# Patient Record
Sex: Female | Born: 1960 | Race: Black or African American | Hispanic: No | Marital: Single | Smoking: Current every day smoker
Health system: Southern US, Community
[De-identification: ages and names within clinical notes are randomized; demographics above are authoritative.]

## PROBLEM LIST (undated history)

## (undated) DIAGNOSIS — N393 Stress incontinence (female) (male): Secondary | ICD-10-CM

## (undated) DIAGNOSIS — E119 Type 2 diabetes mellitus without complications: Secondary | ICD-10-CM

## (undated) DIAGNOSIS — I509 Heart failure, unspecified: Secondary | ICD-10-CM

## (undated) DIAGNOSIS — I10 Essential (primary) hypertension: Secondary | ICD-10-CM

## (undated) DIAGNOSIS — R053 Chronic cough: Secondary | ICD-10-CM

## (undated) DIAGNOSIS — N189 Chronic kidney disease, unspecified: Secondary | ICD-10-CM

---

## 2000-05-21 ENCOUNTER — Other Ambulatory Visit: Admission: RE | Admit: 2000-05-21 | Discharge: 2000-05-21 | Payer: Self-pay | Admitting: *Deleted

## 2003-06-25 ENCOUNTER — Emergency Department (HOSPITAL_COMMUNITY): Admission: AD | Admit: 2003-06-25 | Discharge: 2003-06-25 | Payer: Self-pay | Admitting: Family Medicine

## 2004-05-25 ENCOUNTER — Emergency Department (HOSPITAL_COMMUNITY): Admission: EM | Admit: 2004-05-25 | Discharge: 2004-05-25 | Payer: Self-pay | Admitting: Family Medicine

## 2005-08-28 ENCOUNTER — Emergency Department (HOSPITAL_COMMUNITY): Admission: EM | Admit: 2005-08-28 | Discharge: 2005-08-28 | Payer: Self-pay | Admitting: Family Medicine

## 2006-05-02 ENCOUNTER — Emergency Department (HOSPITAL_COMMUNITY): Admission: EM | Admit: 2006-05-02 | Discharge: 2006-05-02 | Payer: Self-pay | Admitting: Emergency Medicine

## 2006-09-14 ENCOUNTER — Emergency Department (HOSPITAL_COMMUNITY): Admission: EM | Admit: 2006-09-14 | Discharge: 2006-09-14 | Payer: Self-pay | Admitting: Emergency Medicine

## 2008-01-26 ENCOUNTER — Ambulatory Visit: Payer: Self-pay | Admitting: Family Medicine

## 2008-01-26 ENCOUNTER — Ambulatory Visit: Payer: Self-pay | Admitting: *Deleted

## 2008-02-03 ENCOUNTER — Ambulatory Visit (HOSPITAL_COMMUNITY): Admission: RE | Admit: 2008-02-03 | Discharge: 2008-02-03 | Payer: Self-pay | Admitting: Family Medicine

## 2008-02-28 ENCOUNTER — Emergency Department (HOSPITAL_COMMUNITY): Admission: EM | Admit: 2008-02-28 | Discharge: 2008-02-28 | Payer: Self-pay | Admitting: Family Medicine

## 2009-06-04 ENCOUNTER — Ambulatory Visit: Payer: Self-pay | Admitting: Family Medicine

## 2010-02-04 ENCOUNTER — Emergency Department (HOSPITAL_COMMUNITY): Admission: EM | Admit: 2010-02-04 | Discharge: 2010-02-04 | Payer: Self-pay | Admitting: Family Medicine

## 2010-05-04 ENCOUNTER — Encounter: Payer: Self-pay | Admitting: Family Medicine

## 2010-05-21 ENCOUNTER — Inpatient Hospital Stay (INDEPENDENT_AMBULATORY_CARE_PROVIDER_SITE_OTHER)
Admission: RE | Admit: 2010-05-21 | Discharge: 2010-05-21 | Disposition: A | Discharge status: Home / Self Care | Payer: Self-pay | Source: Ambulatory Visit | Attending: Family Medicine | Admitting: Family Medicine

## 2010-05-21 DIAGNOSIS — J40 Bronchitis, not specified as acute or chronic: Secondary | ICD-10-CM

## 2010-08-31 ENCOUNTER — Inpatient Hospital Stay (INDEPENDENT_AMBULATORY_CARE_PROVIDER_SITE_OTHER)
Admission: RE | Admit: 2010-08-31 | Discharge: 2010-08-31 | Disposition: A | Discharge status: Home / Self Care | Payer: Self-pay | Source: Ambulatory Visit | Attending: Family Medicine | Admitting: Family Medicine

## 2010-08-31 DIAGNOSIS — L03119 Cellulitis of unspecified part of limb: Secondary | ICD-10-CM

## 2010-09-26 ENCOUNTER — Inpatient Hospital Stay (INDEPENDENT_AMBULATORY_CARE_PROVIDER_SITE_OTHER)
Admission: RE | Admit: 2010-09-26 | Discharge: 2010-09-26 | Disposition: A | Discharge status: Home / Self Care | Payer: Self-pay | Source: Ambulatory Visit | Attending: Family Medicine | Admitting: Family Medicine

## 2010-09-26 DIAGNOSIS — L02619 Cutaneous abscess of unspecified foot: Secondary | ICD-10-CM

## 2010-12-23 ENCOUNTER — Other Ambulatory Visit: Payer: Self-pay | Admitting: Family Medicine

## 2010-12-23 ENCOUNTER — Other Ambulatory Visit (HOSPITAL_COMMUNITY): Payer: Self-pay | Admitting: Family Medicine

## 2010-12-23 DIAGNOSIS — Z1231 Encounter for screening mammogram for malignant neoplasm of breast: Secondary | ICD-10-CM

## 2011-01-02 ENCOUNTER — Ambulatory Visit (HOSPITAL_COMMUNITY)
Admission: RE | Admit: 2011-01-02 | Discharge: 2011-01-02 | Disposition: A | Discharge status: Home / Self Care | Payer: Self-pay | Source: Ambulatory Visit | Attending: Family Medicine | Admitting: Family Medicine

## 2011-01-02 DIAGNOSIS — Z1231 Encounter for screening mammogram for malignant neoplasm of breast: Secondary | ICD-10-CM | POA: Insufficient documentation

## 2011-01-07 ENCOUNTER — Other Ambulatory Visit: Payer: Self-pay | Admitting: Family Medicine

## 2011-01-07 DIAGNOSIS — R928 Other abnormal and inconclusive findings on diagnostic imaging of breast: Secondary | ICD-10-CM

## 2011-01-13 LAB — POCT URINALYSIS DIP (DEVICE)
Bilirubin Urine: NEGATIVE
Glucose, UA: NEGATIVE mg/dL
Hgb urine dipstick: NEGATIVE
Ketones, ur: NEGATIVE mg/dL
Nitrite: NEGATIVE
Protein, ur: NEGATIVE mg/dL
Specific Gravity, Urine: 1.02 (ref 1.005–1.030)
Urobilinogen, UA: 0.2 mg/dL (ref 0.0–1.0)
pH: 6.5 (ref 5.0–8.0)

## 2011-01-13 LAB — POCT PREGNANCY, URINE: Preg Test, Ur: NEGATIVE

## 2011-01-19 ENCOUNTER — Ambulatory Visit
Admission: RE | Admit: 2011-01-19 | Discharge: 2011-01-19 | Disposition: A | Discharge status: Home / Self Care | Payer: No Typology Code available for payment source | Source: Ambulatory Visit | Attending: Family Medicine | Admitting: Family Medicine

## 2011-01-19 DIAGNOSIS — R928 Other abnormal and inconclusive findings on diagnostic imaging of breast: Secondary | ICD-10-CM

## 2012-05-15 ENCOUNTER — Emergency Department (HOSPITAL_COMMUNITY)
Admission: EM | Admit: 2012-05-15 | Discharge: 2012-05-15 | Disposition: A | Discharge status: Home / Self Care | Payer: Self-pay | Attending: Emergency Medicine | Admitting: Emergency Medicine

## 2012-05-15 ENCOUNTER — Encounter (HOSPITAL_COMMUNITY): Payer: Self-pay | Admitting: *Deleted

## 2012-05-15 ENCOUNTER — Emergency Department (HOSPITAL_COMMUNITY): Payer: Self-pay

## 2012-05-15 ENCOUNTER — Encounter (HOSPITAL_COMMUNITY): Payer: Self-pay | Admitting: Emergency Medicine

## 2012-05-15 ENCOUNTER — Emergency Department (INDEPENDENT_AMBULATORY_CARE_PROVIDER_SITE_OTHER)
Admission: EM | Admit: 2012-05-15 | Discharge: 2012-05-15 | Disposition: A | Discharge status: Nursing Home (Includes ICF) | Payer: Self-pay | Source: Home / Self Care | Attending: Emergency Medicine | Admitting: Emergency Medicine

## 2012-05-15 DIAGNOSIS — R05 Cough: Secondary | ICD-10-CM | POA: Insufficient documentation

## 2012-05-15 DIAGNOSIS — R059 Cough, unspecified: Secondary | ICD-10-CM | POA: Insufficient documentation

## 2012-05-15 DIAGNOSIS — R079 Chest pain, unspecified: Secondary | ICD-10-CM | POA: Insufficient documentation

## 2012-05-15 DIAGNOSIS — R509 Fever, unspecified: Secondary | ICD-10-CM | POA: Insufficient documentation

## 2012-05-15 DIAGNOSIS — R5381 Other malaise: Secondary | ICD-10-CM | POA: Insufficient documentation

## 2012-05-15 DIAGNOSIS — R093 Abnormal sputum: Secondary | ICD-10-CM | POA: Insufficient documentation

## 2012-05-15 DIAGNOSIS — J189 Pneumonia, unspecified organism: Secondary | ICD-10-CM | POA: Insufficient documentation

## 2012-05-15 DIAGNOSIS — I10 Essential (primary) hypertension: Secondary | ICD-10-CM | POA: Insufficient documentation

## 2012-05-15 DIAGNOSIS — J029 Acute pharyngitis, unspecified: Secondary | ICD-10-CM | POA: Insufficient documentation

## 2012-05-15 DIAGNOSIS — J3489 Other specified disorders of nose and nasal sinuses: Secondary | ICD-10-CM | POA: Insufficient documentation

## 2012-05-15 DIAGNOSIS — IMO0001 Reserved for inherently not codable concepts without codable children: Secondary | ICD-10-CM | POA: Insufficient documentation

## 2012-05-15 DIAGNOSIS — R490 Dysphonia: Secondary | ICD-10-CM | POA: Insufficient documentation

## 2012-05-15 DIAGNOSIS — Z79899 Other long term (current) drug therapy: Secondary | ICD-10-CM | POA: Insufficient documentation

## 2012-05-15 DIAGNOSIS — F17200 Nicotine dependence, unspecified, uncomplicated: Secondary | ICD-10-CM | POA: Insufficient documentation

## 2012-05-15 DIAGNOSIS — R0602 Shortness of breath: Secondary | ICD-10-CM | POA: Insufficient documentation

## 2012-05-15 LAB — CBC WITH DIFFERENTIAL/PLATELET
Basophils Absolute: 0 10*3/uL (ref 0.0–0.1)
Basophils Relative: 0 % (ref 0–1)
Eosinophils Relative: 1 % (ref 0–5)
HCT: 43.4 % (ref 36.0–46.0)
Hemoglobin: 15.3 g/dL — ABNORMAL HIGH (ref 12.0–15.0)
Lymphocytes Relative: 28 % (ref 12–46)
Lymphs Abs: 2.1 10*3/uL (ref 0.7–4.0)
MCHC: 35.3 g/dL (ref 30.0–36.0)
MCV: 86.5 fL (ref 78.0–100.0)
Monocytes Absolute: 0.9 10*3/uL (ref 0.1–1.0)
Platelets: 255 10*3/uL (ref 150–400)
RDW: 13.2 % (ref 11.5–15.5)
WBC: 7.4 10*3/uL (ref 4.0–10.5)

## 2012-05-15 LAB — POCT I-STAT TROPONIN I: Troponin i, poc: 0 ng/mL (ref 0.00–0.08)

## 2012-05-15 LAB — BASIC METABOLIC PANEL
CO2: 21 mEq/L (ref 19–32)
Calcium: 9.5 mg/dL (ref 8.4–10.5)
Chloride: 100 mEq/L (ref 96–112)
Creatinine, Ser: 0.62 mg/dL (ref 0.50–1.10)
GFR calc Af Amer: >90 mL/min (ref >90)
Glucose, Bld: 125 mg/dL — ABNORMAL HIGH (ref 70–99)
Potassium: 3.9 mEq/L (ref 3.5–5.1)
Sodium: 132 mEq/L — ABNORMAL LOW (ref 135–145)

## 2012-05-15 MED ORDER — HYDROCHLOROTHIAZIDE 25 MG PO TABS: 12.5000 mg | ORAL_TABLET | Freq: Every day | ORAL | Status: DC

## 2012-05-15 MED ORDER — HYDROCHLOROTHIAZIDE 25 MG PO TABS
12.5000 mg | ORAL_TABLET | Freq: Once | ORAL | Status: AC
  Administered 2012-05-15: 12.5 mg via ORAL
  Filled 2012-05-15: qty 1

## 2012-05-15 MED ORDER — CEPHALEXIN 500 MG PO CAPS: 1000.0000 mg | ORAL_CAPSULE | Freq: Two times a day (BID) | ORAL | Status: DC

## 2012-05-15 MED ORDER — AZITHROMYCIN 250 MG PO TABS: ORAL_TABLET | ORAL | Status: DC

## 2012-05-15 NOTE — ED Notes (Signed)
Pt resting on exam table.  Denies any HA.

## 2012-05-15 NOTE — ED Notes (Signed)
C/O cough with inability to cough up sputum x 3 days with some SOB, chest pain when coughing, hoarse voice.  Has been taking Mucinex and nighttime cough med, and Tyl.  C/O feeling very weak.  Denies hx HTN. Left breath sounds diminished.  Denies HA, but c/o facial aching.

## 2012-05-15 NOTE — ED Notes (Signed)
Pt transported to xray 

## 2012-05-15 NOTE — ED Notes (Addendum)
Onset 3 days ago productive cough yellow sputum with chest pain upon coughing and body achy. Seen urgent care sent to ED for further evaluation of symptoms and high blood pressure.

## 2012-05-15 NOTE — ED Notes (Signed)
Provider exam in progress.

## 2012-05-15 NOTE — ED Notes (Signed)
Pt to be transferred to ED via shuttle.  Report called to [facility], ED Nurse First.

## 2012-05-15 NOTE — ED Provider Notes (Signed)
Medical screening examination/treatment/procedure(s) were performed by non-physician practitioner and as supervising physician I was immediately available for consultation/collaboration.  Raynald Blend, MD 05/15/12 [phone]

## 2012-05-15 NOTE — ED Provider Notes (Signed)
History     CSN: [id number]  Arrival date & time 05/15/12  1333   First MD Initiated Contact with Patient 05/15/12 1413      Chief Complaint  Patient presents with  . Cough  . Fever  . Chest Pain    HPI: Patient is a 52 y.o. female presenting with cough, fever, and chest pain. The history is provided by the patient.  Cough This is a new problem. The current episode started more than 2 days ago. The problem occurs every few minutes. The problem has been gradually worsening. The cough is productive of sputum. The maximum temperature recorded prior to her arrival was 101 to 101.9 F. The fever has been present for less than 1 day. Associated symptoms include chest pain, chills, headaches and sore throat. Pertinent negatives include no ear congestion, no ear pain, no rhinorrhea, no myalgias, no shortness of breath and no wheezing. She has tried decongestants and cough syrup for the symptoms. The treatment provided no relief.  Fever Primary symptoms of the febrile illness include fever, headaches and cough. Primary symptoms do not include wheezing, shortness of breath, abdominal pain, nausea, vomiting, diarrhea, dysuria or myalgias.  Chest Pain Primary symptoms include a fever and cough. Pertinent negatives for primary symptoms include no shortness of breath, no wheezing, no abdominal pain, no nausea and no vomiting.   Pt reports onset of sever persistent cough 3 days ago that has worsened. The cough is productive of thick yellow sputum. Symptoms started with chills and fever though pt did not take her temp so amount unknown. State she has coughed so much that her chest hurts, her throat is scratchy and raw and it burns when she breathes in.  When ask about her BP pt admits she has been told in the past that she had high BP but has never been placed on medication for same. She denies h/a but c/o facial aching. Pt denies N/V/D or abd pain.   History reviewed. No pertinent past medical  history.  History reviewed. No pertinent past surgical history.  No family history on file.  History  Substance Use Topics  . Smoking status: Current Some Day Smoker    Types: Cigarettes  . Smokeless tobacco: Not on file  . Alcohol Use: No    OB History    Grav Para Term Preterm Abortions TAB SAB Ect Mult Living                  Review of Systems  Constitutional: Positive for fever and chills.  HENT: Positive for sore throat. Negative for ear pain and rhinorrhea.   Eyes: Negative.   Respiratory: Positive for cough. Negative for shortness of breath and wheezing.   Cardiovascular: Positive for chest pain.  Gastrointestinal: Negative for nausea, vomiting, abdominal pain and diarrhea.  Genitourinary: Negative.  Negative for dysuria and frequency.  Musculoskeletal: Negative.  Negative for myalgias.  Skin: Negative.   Neurological: Positive for headaches.  Hematological: Negative.   Psychiatric/Behavioral: Negative.     Allergies  Review of patient's allergies indicates no known allergies.  Home Medications  No current outpatient prescriptions on file.  BP 216/109  Pulse 78  Temp 98.2 F (36.8 C) (Oral)  Resp 20  SpO2 99%  LMP 04/19/2012  Physical Exam  Constitutional: She is oriented to person, place, and time. She appears well-developed and well-nourished. She appears ill.  HENT:  Head: Normocephalic and atraumatic.  Right Ear: Tympanic membrane, external ear and ear canal normal.  Left Ear: Tympanic membrane, external ear and ear canal normal.  Nose: Nose normal.  Mouth/Throat: Oropharynx is clear and moist.  Eyes: Conjunctivae normal are normal.  Neck: Neck supple.  Cardiovascular: Normal rate and regular rhythm.   Pulmonary/Chest:       BBS diminished w/ mild tachypnea  Musculoskeletal: Normal range of motion.  Neurological: She is alert and oriented to person, place, and time.  Skin: Skin is warm and dry.  Psychiatric: She has a normal mood and affect.     ED Course  Procedures   Labs Reviewed - No data to display No results found.   No diagnosis found.    MDM  Pt w/ 3 day h/o severe, persistent, productive cough.  Pt appears ill. SBP > 200, DBP >110. EKG w/o acute findings. Given hx, current PE and hypertension feel pt needs more in depth evaluation than can be provided here. Pt likely will will need full evaluation for cough and HTN. Discussed w/ pt who is agreeable. Discussed pt w/ Dr Ladon Applebaum who has also reviewed EKG and is agreement w/ plan.         Roma Kayser Skylar Priest, NP 05/15/12 [phone]

## 2012-05-15 NOTE — ED Notes (Signed)
Merdis Delay, NP notified of BP and sxs.

## 2012-05-15 NOTE — ED Provider Notes (Signed)
History     CSN: [id number]  Arrival date & time 05/15/12  1537   First MD Initiated Contact with Patient 05/15/12 1554      Chief Complaint  Patient presents with  . Cough  . Chest Pain  . Hypertension    (Consider location/radiation/quality/duration/timing/severity/associated sxs/prior treatment) HPI Comments: 52 y /o F p/w cough, SOB, chest pain. Onset cough aboud 3 days ago. Intermittently productive of yellow sputum. Initially with chest pain only with coughing. Now more persistent today. SOB primarily with exertion. Also with hoarse voice today. Reports some congestion, rhinorrhea, and sore throat. Tolerating po. Diffuse weakness. Subjective fevers at home. Taking tylenol, cough syrup, and mucinex.  Presented to urgent care and found to be hypertensive. No h /o hypertension. Last saw PCP for physical one year ago. Smokes. No h/o heart dz or DVT/PE in personal history or family history.  Patient is a 52 y.o. female presenting with cough, chest pain, and hypertension.  Cough This is a new problem. The current episode started more than 2 days ago. The problem occurs constantly. The problem has been gradually worsening. The cough is productive of sputum. Maximum temperature: subjective. Associated symptoms include chest pain, rhinorrhea, sore throat and shortness of breath. Pertinent negatives include no headaches. She is a smoker.  Chest Pain The chest pain began 3 - 5 days ago. Chest pain occurs intermittently. The chest pain is worsening. The pain is associated with coughing. The severity of the pain is moderate. The quality of the pain is described as aching. The pain does not radiate. Primary symptoms include a fever, fatigue, shortness of breath and cough. Pertinent negatives for primary symptoms include no abdominal pain, no nausea, no vomiting and no dizziness.  Her past medical history is significant for hypertension.    Hypertension Associated symptoms include chest pain,  congestion, coughing, fatigue, a fever and a sore throat. Pertinent negatives include no abdominal pain, headaches, nausea, neck pain, rash or vomiting.    History reviewed. No pertinent past medical history.  History reviewed. No pertinent past surgical history.  No family history on file.  History  Substance Use Topics  . Smoking status: Current Some Day Smoker    Types: Cigarettes  . Smokeless tobacco: Not on file  . Alcohol Use: No    OB History    Grav Para Term Preterm Abortions TAB SAB Ect Mult Living                  Review of Systems  Constitutional: Positive for fever and fatigue. Negative for appetite change.  HENT: Positive for congestion, sore throat and rhinorrhea. Negative for neck pain and neck stiffness.   Eyes: Negative for pain and visual disturbance.  Respiratory: Positive for cough and shortness of breath.   Cardiovascular: Positive for chest pain. Negative for leg swelling.  Gastrointestinal: Negative for nausea, vomiting, abdominal pain and diarrhea.  Genitourinary: Negative for dysuria, hematuria, flank pain and difficulty urinating.  Musculoskeletal: Negative for back pain.  Skin: Negative for color change and rash.  Neurological: Negative for dizziness and headaches.  All other systems reviewed and are negative.    Allergies  Review of patient's allergies indicates no known allergies.  Home Medications   Current Outpatient Rx  Name  Route  Sig  Dispense  Refill  . TYLENOL PO   Oral   Take 2 tablets by mouth every 4 (four) hours as needed. For pain         . MUCINEX PO  Oral   Take 2 tablets by mouth every 12 (twelve) hours as needed. For congestion         . OVER THE COUNTER MEDICATION   Oral   Take 1 Container by mouth every 4 (four) hours as needed. Night time cough medicine for cough         . AZITHROMYCIN 250 MG PO TABS      2 po day one, then 1 daily x 4 days   5 tablet   0   . CEPHALEXIN 500 MG PO CAPS   Oral    Take 2 capsules (1,000 mg total) by mouth 2 (two) times daily.   28 capsule   0   . HYDROCHLOROTHIAZIDE 25 MG PO TABS   Oral   Take 0.5 tablets (12.5 mg total) by mouth daily.   14 tablet   0     BP 193/108  Pulse 82  Temp 98.3 F (36.8 C) (Oral)  Resp 18  SpO2 99%  LMP 04/19/2012  Physical Exam  Nursing note and vitals reviewed. Constitutional: She is oriented to person, place, and time. She appears well-developed and well-nourished. No distress.       Sitting up in bed. NAD. Hoarse voice. Speaks in full sentences Occasional non-productive cough.  HENT:  Head: Normocephalic and atraumatic. No trismus in the jaw.  Mouth/Throat: Uvula is midline, oropharynx is clear and moist and mucous membranes are normal. No uvula swelling.  Eyes: Conjunctivae normal and EOM are normal. Pupils are equal, round, and reactive to light. Right eye exhibits no discharge. Left eye exhibits no discharge.  Neck: Normal range of motion. Neck supple. No tracheal deviation present.  Cardiovascular: Normal rate, regular rhythm, normal heart sounds and intact distal pulses.   Pulmonary/Chest: Effort normal and breath sounds normal. No stridor. No respiratory distress. She has no wheezes. She has no rales. She exhibits no tenderness.  Abdominal: Soft. She exhibits no distension. There is no tenderness. There is no guarding.  Musculoskeletal: She exhibits no edema and no tenderness.  Neurological: She is alert and oriented to person, place, and time.  Skin: Skin is warm and dry.  Psychiatric: She has a normal mood and affect. Her behavior is normal.    ED Course  Procedures (including critical care time)  Labs Reviewed  CBC WITH DIFFERENTIAL - Abnormal; Notable for the following:    Hemoglobin 15.3 (*)     Monocytes Relative 13 (*)     All other components within normal limits  BASIC METABOLIC PANEL - Abnormal; Notable for the following:    Sodium 132 (*)     Glucose, Bld 125 (*)     All other  components within normal limits  POCT I-STAT TROPONIN I   Dg Chest 2 View  05/15/2012  *RADIOLOGY REPORT*  Clinical Data: Cough and chest pain with hypertension.  CHEST - 2 VIEW  Comparison: 02/03/2008.  Findings: Suboptimal inspiration.  There is patchy airspace opacity over the left hemidiaphragm in the left lower lobe.  This projects over the lower thoracic spine on the lateral view.  There is no pleural effusion.  The cardiopericardial silhouette size is probably within normal limits, accentuated by low lung volumes. Follow-up to ensure radiographic clearing recommended.  Clearing is usually observed at 8 weeks.  IMPRESSION: Left lower lobe airspace disease most compatible with pneumonia. Low volume chest.   Original Report Authenticated By: Andreas Newport, M.D.      1. Community acquired pneumonia   2. Hypertension  Date: 05/16/2012  Rate: 75  Rhythm: normal sinus rhythm  QRS Axis: normal  Intervals: normal  ST/T Wave abnormalities: normal  Conduction Disutrbances:none  Narrative Interpretation:   Old EKG Reviewed: no EKG's prior to today    MDM   52 y/o female p/w cough and hypertension. Cough x3 days.   Likely PNA as CXR positive with cough Unlikely COPD/Asthma exacerbation as no history, no hypoxia, no wheezing Unlikely CHF exacerbation as no history. Low risk Unlikely ACS as troponin negative, EKG , low risk per [facility] Unlikely PE as atypical presentation, low risk per [facility] Doubt Aortic Dissection, Pancreatitis, Pneumothorax, Arrhythmia, Endo/Myo/Pericarditis, Esophageal pathology, or other Emergent pathology.   O2 saturations high 90's on room air   CXR concerning for pna. Prescribed keflex and azithromycin. Hypertensive without evidence of end organ damage. Given HCTZ x1 with improvement. Prescribed HCTZ.   Patient discharged home. Return precautions given. To follow up with pcp. patient in agreement with plan.  Labs and imaging reviewed by myself and  considered in medical decision making if ordered. Imaging interpreted by radiology.   Discussed case with Dr. Fonnie Jarvis who is in agreement with assessment and plan.    New Prescriptions   AZITHROMYCIN (ZITHROMAX Z-PAK) 250 MG TABLET    2 po day one, then 1 daily x 4 days   CEPHALEXIN (KEFLEX) 500 MG CAPSULE    Take 2 capsules (1,000 mg total) by mouth 2 (two) times daily.   HYDROCHLOROTHIAZIDE (HYDRODIURIL) 25 MG TABLET    Take 0.5 tablets (12.5 mg total) by mouth daily.     Stevie Kern, MD 05/16/12 [phone]

## 2012-05-15 NOTE — ED Provider Notes (Signed)
I saw and evaluated the patient, reviewed the resident's note including ECG interpretation and I agree with the findings and plan.  Few rales left base, patient appears nontoxic.  Hurman Horn, MD 05/18/12 [phone]

## 2013-07-24 ENCOUNTER — Encounter (HOSPITAL_COMMUNITY): Payer: Self-pay | Admitting: Emergency Medicine

## 2013-07-24 ENCOUNTER — Emergency Department (HOSPITAL_COMMUNITY)
Admission: EM | Admit: 2013-07-24 | Discharge: 2013-07-24 | Disposition: A | Discharge status: Home / Self Care | Payer: No Typology Code available for payment source | Source: Home / Self Care | Attending: Emergency Medicine | Admitting: Emergency Medicine

## 2013-07-24 ENCOUNTER — Ambulatory Visit (HOSPITAL_COMMUNITY): Payer: No Typology Code available for payment source | Attending: Emergency Medicine

## 2013-07-24 DIAGNOSIS — R059 Cough, unspecified: Secondary | ICD-10-CM | POA: Insufficient documentation

## 2013-07-24 DIAGNOSIS — R05 Cough: Secondary | ICD-10-CM | POA: Insufficient documentation

## 2013-07-24 DIAGNOSIS — J40 Bronchitis, not specified as acute or chronic: Secondary | ICD-10-CM

## 2013-07-24 HISTORY — DX: Essential (primary) hypertension: I10

## 2013-07-24 MED ORDER — AZITHROMYCIN 250 MG PO TABS: ORAL_TABLET | ORAL | Status: DC

## 2013-07-24 MED ORDER — AEROCHAMBER PLUS FLO-VU LARGE MISC: 1.0000 | Freq: Once | Status: DC

## 2013-07-24 MED ORDER — GUAIFENESIN-CODEINE 100-10 MG/5ML PO SOLN: 10.0000 mL | ORAL | Status: DC | PRN

## 2013-07-24 MED ORDER — ALBUTEROL SULFATE HFA 108 (90 BASE) MCG/ACT IN AERS: 2.0000 | INHALATION_SPRAY | RESPIRATORY_TRACT | Status: DC | PRN

## 2013-07-24 NOTE — Discharge Instructions (Signed)

## 2013-07-24 NOTE — ED Notes (Signed)
Pt states symptoms present x 2 wks.  Mw,cma

## 2013-07-24 NOTE — ED Notes (Signed)
C/o  Productive cough with green sputum.  Chest congestion.  Sob.  Chest soreness.  Denies fever , n/v/d  Mild relief of congestion with mucinex.

## 2013-07-24 NOTE — ED Provider Notes (Signed)
Medical screening examination/treatment/procedure(s) were performed by non-physician practitioner and as supervising physician I was immediately available for consultation/collaboration.  Leslee Homeavid Chasmine Lender, M.D.  Reuben Likesavid C Kortney Schoenfelder, MD 07/24/13 [phone]

## 2013-07-24 NOTE — ED Provider Notes (Signed)
CSN: [id number]     Arrival date & time 07/24/13  [id number] History   None    Chief Complaint  Patient presents with  . URI   (Consider location/radiation/quality/duration/timing/severity/associated sxs/prior Treatment) HPI Comments: [age]f, smoker, presents complaining of cough, chest congestion, chest heaviness, chest pain with coughing, shortness of breath after an episode of coughing. Her symptoms have been going on for 2 weeks, getting gradually worse. She has a history of pneumonia that felt similar to this. She has been taking over-the-counter medications without relief. She denies fever, chills, NVD, abdominal pain, sore throat, sinus pressure. No recent travel or sick contacts. No recent hospitalizations. She claims she smokes about 2-3 cigarettes daily if any at all for the past 30 years (cigarette smoke smell in the room it is overwhelming)  Patient is a 53 y.o. female presenting with URI.  URI Presenting symptoms: cough   Presenting symptoms: no fever   Associated symptoms: no wheezing     Past Medical History  Diagnosis Date  . Hypertension    History reviewed. No pertinent past surgical history. History reviewed. No pertinent family history. History  Substance Use Topics  . Smoking status: Current Some Day Smoker    Types: Cigarettes  . Smokeless tobacco: Not on file  . Alcohol Use: No   OB History   Grav Para Term Preterm Abortions TAB SAB Ect Mult Living                 Review of Systems  Constitutional: Negative for fever and chills.  Respiratory: Positive for cough, chest tightness and shortness of breath. Negative for wheezing.   All other systems reviewed and are negative.   Allergies  Review of patient's allergies indicates no known allergies.  Home Medications   Current Outpatient Rx  Name  Route  Sig  Dispense  Refill  . Acetaminophen (TYLENOL PO)   Oral   Take 2 tablets by mouth every 4 (four) hours as needed. For pain         . albuterol  (PROVENTIL HFA;VENTOLIN HFA) 108 (90 BASE) MCG/ACT inhaler   Inhalation   Inhale 2 puffs into the lungs every 4 (four) hours as needed for wheezing.   1 Inhaler   0   . azithromycin (ZITHROMAX Z-PAK) 250 MG tablet      2 po day one, then 1 daily x 4 days   5 tablet   0   . azithromycin (ZITHROMAX Z-PAK) 250 MG tablet      Use as directed   6 each   0   . cephALEXin (KEFLEX) 500 MG capsule   Oral   Take 2 capsules (1,000 mg total) by mouth 2 (two) times daily.   28 capsule   0   . GuaiFENesin (MUCINEX PO)   Oral   Take 2 tablets by mouth every 12 (twelve) hours as needed. For congestion         . guaiFENesin-codeine 100-10 MG/5ML syrup   Oral   Take 10 mLs by mouth every 4 (four) hours as needed for cough.   120 mL   0   . hydrochlorothiazide (HYDRODIURIL) 25 MG tablet   Oral   Take 0.5 tablets (12.5 mg total) by mouth daily.   14 tablet   0   . OVER THE COUNTER MEDICATION   Oral   Take 1 Container by mouth every 4 (four) hours as needed. Night time cough medicine for cough         .  Spacer/Aero-Holding Chambers (AEROCHAMBER PLUS FLO-VU LARGE) MISC   Other   1 each by Other route once.   1 each   0    BP 180/94  Pulse 74  Temp(Src) 98.5 F (36.9 C) (Oral)  Resp 24  SpO2 100%  LMP 07/03/2013 Physical Exam  Nursing note and vitals reviewed. Constitutional: She is oriented to person, place, and time. Vital signs are normal. She appears well-developed and well-nourished. No distress.  HENT:  Head: Normocephalic and atraumatic.  Cardiovascular: Normal rate, regular rhythm and normal heart sounds.   Pulmonary/Chest: Effort normal. No accessory muscle usage. Not tachypneic and not bradypneic. No respiratory distress. She has no wheezes. She has no rhonchi. She has rales in the right lower field.  Neurological: She is alert and oriented to person, place, and time. She has normal strength. Coordination normal.  Skin: Skin is warm and dry. No rash noted. She  is not diaphoretic.  Psychiatric: She has a normal mood and affect. Judgment normal.    ED Course  Procedures (including critical care time) Labs Review Labs Reviewed - No data to display Imaging Review Dg Chest 2 View  07/24/2013   CLINICAL DATA:  Cough, cold symptoms  EXAM: CHEST  2 VIEW  COMPARISON:  05/15/2012  FINDINGS: Cardiomediastinal silhouette is stable. No acute infiltrate or pleural effusion. No pulmonary edema. Mild degenerative changes thoracic spine.  IMPRESSION: No active cardiopulmonary disease.   Electronically Signed   By: Natasha MeadLiviu  Pop M.D.   On: 07/24/2013 11:36    1105: PNA vs bronchitis, vs COPD exacerbation (no previous Dx of COPD).  Getting CXR   MDM   1. Bronchitis     CXR normal.  Treat for bronchitis.  Given duration of illness, also will use azithromycin.  F/u PRN if not improving    Meds ordered this encounter  Medications  . azithromycin (ZITHROMAX Z-PAK) 250 MG tablet    Sig: Use as directed    Dispense:  6 each    Refill:  0    Order Specific Question:  Supervising Provider    Answer:  Lorenz CoasterKELLER, DAVID C [id number]]  . guaiFENesin-codeine 100-10 MG/5ML syrup    Sig: Take 10 mLs by mouth every 4 (four) hours as needed for cough.    Dispense:  120 mL    Refill:  0    Order Specific Question:  Supervising Provider    Answer:  Lorenz CoasterKELLER, DAVID C [id number]]  . albuterol (PROVENTIL HFA;VENTOLIN HFA) 108 (90 BASE) MCG/ACT inhaler    Sig: Inhale 2 puffs into the lungs every 4 (four) hours as needed for wheezing.    Dispense:  1 Inhaler    Refill:  0    Order Specific Question:  Supervising Provider    Answer:  Lorenz CoasterKELLER, DAVID C [id number]]  . Spacer/Aero-Holding Chambers (AEROCHAMBER PLUS FLO-VU LARGE) MISC    Sig: 1 each by Other route once.    Dispense:  1 each    Refill:  0    Order Specific Question:  Supervising Provider    Answer:  Lorenz CoasterKELLER, DAVID C [[id number]]     Graylon GoodZachary H Dahlila Pfahler, PA-C 07/24/13 1149

## 2013-08-30 ENCOUNTER — Ambulatory Visit: Payer: No Typology Code available for payment source | Admitting: Internal Medicine

## 2013-10-05 ENCOUNTER — Ambulatory Visit: Payer: No Typology Code available for payment source | Admitting: Internal Medicine

## 2013-10-11 ENCOUNTER — Encounter: Payer: Self-pay | Admitting: Internal Medicine

## 2013-10-11 ENCOUNTER — Other Ambulatory Visit (HOSPITAL_COMMUNITY)
Admission: RE | Admit: 2013-10-11 | Discharge: 2013-10-11 | Disposition: A | Discharge status: Home / Self Care | Payer: No Typology Code available for payment source | Source: Ambulatory Visit | Attending: Internal Medicine | Admitting: Internal Medicine

## 2013-10-11 ENCOUNTER — Ambulatory Visit: Payer: No Typology Code available for payment source | Attending: Internal Medicine | Admitting: Internal Medicine

## 2013-10-11 VITALS — BP 176/94 | HR 76 | Temp 97.9°F | Resp 20 | Ht 64.0 in | Wt 148.6 lb

## 2013-10-11 DIAGNOSIS — Z124 Encounter for screening for malignant neoplasm of cervix: Secondary | ICD-10-CM

## 2013-10-11 DIAGNOSIS — I10 Essential (primary) hypertension: Secondary | ICD-10-CM

## 2013-10-11 DIAGNOSIS — Z23 Encounter for immunization: Secondary | ICD-10-CM

## 2013-10-11 DIAGNOSIS — Z1151 Encounter for screening for human papillomavirus (HPV): Secondary | ICD-10-CM | POA: Insufficient documentation

## 2013-10-11 DIAGNOSIS — F17200 Nicotine dependence, unspecified, uncomplicated: Secondary | ICD-10-CM

## 2013-10-11 DIAGNOSIS — N760 Acute vaginitis: Secondary | ICD-10-CM | POA: Insufficient documentation

## 2013-10-11 DIAGNOSIS — Z113 Encounter for screening for infections with a predominantly sexual mode of transmission: Secondary | ICD-10-CM | POA: Insufficient documentation

## 2013-10-11 DIAGNOSIS — Z0000 Encounter for general adult medical examination without abnormal findings: Secondary | ICD-10-CM

## 2013-10-11 LAB — POCT URINALYSIS DIPSTICK
GLUCOSE UA: NEGATIVE
Ketones, UA: NEGATIVE
Leukocytes, UA: NEGATIVE
Nitrite, UA: NEGATIVE
Protein, UA: 30
RBC UA: NEGATIVE
Spec Grav, UA: >=1.03
UROBILINOGEN UA: 0.2
pH, UA: 6

## 2013-10-11 MED ORDER — CLONIDINE HCL 0.1 MG PO TABS
0.1000 mg | ORAL_TABLET | Freq: Once | ORAL | Status: AC
  Administered 2013-10-11: 0.1 mg via ORAL

## 2013-10-11 MED ORDER — CYCLOBENZAPRINE HCL 10 MG PO TABS: 10.0000 mg | ORAL_TABLET | Freq: Three times a day (TID) | ORAL | Status: DC | PRN

## 2013-10-11 MED ORDER — METRONIDAZOLE 500 MG PO TABS: 500.0000 mg | ORAL_TABLET | Freq: Two times a day (BID) | ORAL | Status: DC

## 2013-10-11 MED ORDER — HYDROCHLOROTHIAZIDE 25 MG PO TABS: 12.5000 mg | ORAL_TABLET | Freq: Every day | ORAL | Status: DC

## 2013-10-11 NOTE — Progress Notes (Signed)
Patient presents to establish care Would like pap and breast exam C/O Cough at night. States she has never been diagnosed with asthma. States history of HTN but has not been taking med for over a year. C/O low back pain for over a year

## 2013-10-11 NOTE — Progress Notes (Signed)
Patient ID: Amanda [name], female   DOB: April 18, 1960, 53 y.o.   MRN: [id number]  WGN:[id number]  HQI:[id number]:[id number]  DOB - April 18, 1960  CC:  Chief Complaint  Patient presents with  . Establish Care  . Gynecologic Exam       HPI: Amanda [name] is a 53 y.o. female here today to establish medical care.  Patient reports that she has not had a physical and pap in a few years.  Patient reports that she has a history of normal pap smears.  Patient reports that she has been out of her BP medication for over one year due to lack of insurance.  Patient would like to start back on BP medication.  Patient reports that she has had ear itching for the past 6 months. She has no other complaints today.   No Known Allergies Past Medical History  Diagnosis Date  . Hypertension    Current Outpatient Prescriptions on File Prior to Visit  Medication Sig Dispense Refill  . Acetaminophen (TYLENOL PO) Take 2 tablets by mouth every 4 (four) hours as needed. For pain      . albuterol (PROVENTIL HFA;VENTOLIN HFA) 108 (90 BASE) MCG/ACT inhaler Inhale 2 puffs into the lungs every 4 (four) hours as needed for wheezing.  1 Inhaler  0  . azithromycin (ZITHROMAX Z-PAK) 250 MG tablet 2 po day one, then 1 daily x 4 days  5 tablet  0  . azithromycin (ZITHROMAX Z-PAK) 250 MG tablet Use as directed  6 each  0  . cephALEXin (KEFLEX) 500 MG capsule Take 2 capsules (1,000 mg total) by mouth 2 (two) times daily.  28 capsule  0  . GuaiFENesin (MUCINEX PO) Take 2 tablets by mouth every 12 (twelve) hours as needed. For congestion      . guaiFENesin-codeine 100-10 MG/5ML syrup Take 10 mLs by mouth every 4 (four) hours as needed for cough.  120 mL  0  . hydrochlorothiazide (HYDRODIURIL) 25 MG tablet Take 0.5 tablets (12.5 mg total) by mouth daily.  14 tablet  0  . OVER THE COUNTER MEDICATION Take 1 Container by mouth every 4 (four) hours as needed. Night time cough medicine for cough      . Spacer/Aero-Holding Chambers (AEROCHAMBER PLUS  FLO-VU LARGE) MISC 1 each by Other route once.  1 each  0   No current facility-administered medications on file prior to visit.   History reviewed. No pertinent family history. History   Social History  . Marital Status: Single    Spouse Name: N/A    Number of Children: N/A  . Years of Education: N/A   Occupational History  . Not on file.   Social History Main Topics  . Smoking status: Current Some Day Smoker    Types: Cigarettes  . Smokeless tobacco: Not on file  . Alcohol Use: No  . Drug Use: No  . Sexual Activity: Yes   Other Topics Concern  . Not on file   Social History Narrative  . No narrative on file   Review of Systems  Constitutional: Negative.   HENT: Positive for ear pain. Negative for ear discharge and sore throat.   Eyes: Negative.   Respiratory: Negative.   Cardiovascular: Positive for chest pain (occasional). Negative for palpitations, claudication and leg swelling.  Gastrointestinal: Negative.   Genitourinary: Negative.   Musculoskeletal: Positive for back pain (ache, with laying down).  Neurological: Positive for headaches.      Objective:   Filed Vitals:   10/11/13  0956  BP: 170/101  Pulse: 72  Temp: 97.9 F (36.6 C)  Resp: 20   Physical Exam  Vitals reviewed. Constitutional: She is oriented to person, place, and time. She appears well-nourished.  HENT:  Right Ear: External ear normal.  Left Ear: External ear normal.  Mouth/Throat: Oropharynx is clear and moist.  Eyes: Conjunctivae are normal. Pupils are equal, round, and reactive to light.  Neck: Normal range of motion. Neck supple. No thyromegaly present.  Cardiovascular: Normal rate, regular rhythm and normal heart sounds.   Pulmonary/Chest: Effort normal and breath sounds normal.  Abdominal: Soft. Bowel sounds are normal.  Genitourinary: Uterus normal. There is no tenderness on the right labia. There is no tenderness on the left labia. Cervix exhibits no motion tenderness, no  discharge and no friability. Right adnexum displays no tenderness. Left adnexum displays no tenderness. Vaginal discharge (thin white, odorous discharge noted) found.  Lymphadenopathy:    She has no cervical adenopathy.       Right: No inguinal adenopathy present.       Left: No inguinal adenopathy present.  Neurological: She is alert and oriented to person, place, and time. She has normal reflexes. No cranial nerve deficit.  Psychiatric: She has a normal mood and affect. Thought content normal.     Lab Results  Component Value Date   WBC 7.4 05/15/2012   HGB 15.3* 05/15/2012   HCT 43.4 05/15/2012   MCV 86.5 05/15/2012   PLT 255 05/15/2012   Lab Results  Component Value Date   CREATININE 0.62 05/15/2012   BUN 8 05/15/2012   NA 132* 05/15/2012   K 3.9 05/15/2012   CL 100 05/15/2012   CO2 21 05/15/2012    No results found for this basename: HGBA1C   Lipid Panel  No results found for this basename: chol, trig, hdl, cholhdl, vldl, ldlcalc       Assessment and plan:   Amanda [name] was seen today for establish care and gynecologic exam.  Diagnoses and associated orders for this visit:  Essential hypertension, benign - cloNIDine (CATAPRES) tablet 0.1 mg; Take 1 tablet (0.1 mg total) by mouth once. - hydrochlorothiazide (HYDRODIURIL) 25 MG tablet; Take 0.5 tablets (12.5 mg total) by mouth daily.  Papanicolaou smear - HIV antibody (with reflex) - RPR - Cytology - PAP [name] - Cervicovaginal ancillary only  Vaginosis - metroNIDAZOLE (FLAGYL) 500 MG tablet; Take 1 tablet (500 mg total) by mouth 2 (two) times daily.  Tobacco use disorder Smoke cessation discussed Preventative health care - MM Digital Screening; Future - Tdap vaccine greater than or equal to 7yo IM - HM COLONOSCOPY - Lipid panel; Future - CBC; Future - COMPLETE METABOLIC PANEL WITH GFR; Future - TSH; Future - Hemoglobin A1C; Future - POCT urinalysis dipstick  Other Orders - cyclobenzaprine (FLEXERIL) 10 MG tablet; Take  1 tablet (10 mg total) by mouth 3 (three) times daily as needed for muscle spasms.    Return in about 1 week (around 10/18/2013) for Lab Visit and BP check, 3 mo PCP.    [name], [name], [name] [facility] [phone] 10/17/2013, 12:33 PM

## 2013-10-12 LAB — CYTOLOGY - PAP

## 2013-10-12 LAB — RPR

## 2013-10-12 LAB — HIV ANTIBODY (ROUTINE TESTING W REFLEX): HIV 1&2 Ab, 4th Generation: NONREACTIVE

## 2013-10-17 ENCOUNTER — Telehealth: Payer: Self-pay | Admitting: *Deleted

## 2013-10-17 NOTE — Telephone Encounter (Signed)
Attempted to reach patient on home and mobile phones to discuss lab results.  Unable to leave message as VM was not set up on either phone

## 2013-10-17 NOTE — Telephone Encounter (Signed)
Message copied by Fredderick SeveranceUCATTE, LAURENZE L on Tue Oct 17, 2013  3:14 PM ------      Message from: [name], [name] A      Created: Tue Oct 17, 2013 12:34 PM       Let patient know she came back positive for bacterial vaginosis. Explained that this is not a sexually transmitted disease. Find out if patient completed course of Flagyl, was given at office visit. ------

## 2013-10-26 ENCOUNTER — Telehealth: Payer: Self-pay

## 2013-10-26 NOTE — Telephone Encounter (Signed)
Message copied by Lestine MountJUAREZ, Harlem Thresher L on Thu Oct 26, 2013 10:20 AM ------      Message from: [name], [name] A      Created: Wed Oct 25, 2013  7:29 PM       Pap negative for malignancy. ------

## 2013-10-26 NOTE — Telephone Encounter (Signed)
Left message with family member to return our call.

## 2013-11-01 ENCOUNTER — Ambulatory Visit: Payer: No Typology Code available for payment source | Attending: Internal Medicine | Admitting: *Deleted

## 2013-11-01 VITALS — BP 181/110 | HR 84

## 2013-11-01 DIAGNOSIS — Z0000 Encounter for general adult medical examination without abnormal findings: Secondary | ICD-10-CM

## 2013-11-01 DIAGNOSIS — F17200 Nicotine dependence, unspecified, uncomplicated: Secondary | ICD-10-CM | POA: Insufficient documentation

## 2013-11-01 DIAGNOSIS — R030 Elevated blood-pressure reading, without diagnosis of hypertension: Secondary | ICD-10-CM | POA: Insufficient documentation

## 2013-11-01 DIAGNOSIS — I10 Essential (primary) hypertension: Secondary | ICD-10-CM

## 2013-11-01 LAB — CBC
HEMATOCRIT: 40.9 % (ref 36.0–46.0)
Hemoglobin: 13.9 g/dL (ref 12.0–15.0)
MCH: 28.7 pg (ref 26.0–34.0)
MCHC: 34 g/dL (ref 30.0–36.0)
MCV: 84.3 fL (ref 78.0–100.0)
Platelets: 285 10*3/uL (ref 150–400)
RBC: 4.85 MIL/uL (ref 3.87–5.11)
RDW: 14.5 % (ref 11.5–15.5)
WBC: 4.2 10*3/uL (ref 4.0–10.5)

## 2013-11-01 LAB — LIPID PANEL
Cholesterol: 167 mg/dL (ref 0–200)
HDL: 48 mg/dL (ref >39)
LDL CALC: 68 mg/dL (ref 0–99)
Total CHOL/HDL Ratio: 3.5 Ratio
Triglycerides: 255 mg/dL — ABNORMAL HIGH (ref <150)
VLDL: 51 mg/dL — ABNORMAL HIGH (ref 0–40)

## 2013-11-01 LAB — COMPLETE METABOLIC PANEL WITH GFR
ALBUMIN: 3.8 g/dL (ref 3.5–5.2)
ALT: 15 U/L (ref 0–35)
AST: 21 U/L (ref 0–37)
Alkaline Phosphatase: 71 U/L (ref 39–117)
BUN: 17 mg/dL (ref 6–23)
CALCIUM: 9.1 mg/dL (ref 8.4–10.5)
CHLORIDE: 105 meq/L (ref 96–112)
CO2: 26 meq/L (ref 19–32)
Creat: 0.9 mg/dL (ref 0.50–1.10)
GFR, EST AFRICAN AMERICAN: 86 mL/min
GFR, Est Non African American: 74 mL/min
Glucose, Bld: 99 mg/dL (ref 70–99)
POTASSIUM: 4.7 meq/L (ref 3.5–5.3)
Sodium: 139 mEq/L (ref 135–145)
Total Bilirubin: 0.3 mg/dL (ref 0.2–1.2)
Total Protein: 7.7 g/dL (ref 6.0–8.3)

## 2013-11-01 LAB — TSH: TSH: 1.464 u[IU]/mL (ref 0.350–4.500)

## 2013-11-01 MED ORDER — CLONIDINE HCL 0.1 MG PO TABS
0.2000 mg | ORAL_TABLET | Freq: Once | ORAL | Status: AC
  Administered 2013-11-01: 0.2 mg via ORAL

## 2013-11-01 NOTE — Progress Notes (Signed)
Patient here for BP check. Patient BP 182/112. 0.2 mg of Clonidine give per protocol. Patient states she has not picked up prescriptions from the pharmacy for BP and just smoked a cigarette before coming in. Cletus GashV. Keck, NP notified. Per Cletus GashV. Keck, NP bring patient back in one week for BP check. If BP is still elevated will place patient on Lisinopril or Norvasc per Cletus GashV. Keck, NP. Informed Patient of plan of care patient verbalized understanding. Amanda [name],Amanda Hartsough Renee, RN

## 2013-11-01 NOTE — Patient Instructions (Signed)
Hypertension Hypertension, commonly called high blood pressure, is when the force of blood pumping through your arteries is too strong. Your arteries are the blood vessels that carry blood from your heart throughout your body. A blood pressure reading consists of a higher number over a lower number, such as 110/72. The higher number (systolic) is the pressure inside your arteries when your heart pumps. The lower number (diastolic) is the pressure inside your arteries when your heart relaxes. Ideally you want your blood pressure below 120/80. Hypertension forces your heart to work harder to pump blood. Your arteries may become narrow or stiff. Having hypertension puts you at risk for heart disease, stroke, and other problems.  RISK FACTORS Some risk factors for high blood pressure are controllable. Others are not.  Risk factors you cannot control include:   Race. You may be at higher risk if you are African American.  Age. Risk increases with age.  Gender. Men are at higher risk than women before age 45 years. After age 65, women are at higher risk than men. Risk factors you can control include:  Not getting enough exercise or physical activity.  Being overweight.  Getting too much fat, sugar, calories, or salt in your diet.  Drinking too much alcohol. SIGNS AND SYMPTOMS Hypertension does not usually cause signs or symptoms. Extremely high blood pressure (hypertensive crisis) may cause headache, anxiety, shortness of breath, and nosebleed. DIAGNOSIS  To check if you have hypertension, your health care provider will measure your blood pressure while you are seated, with your arm held at the level of your heart. It should be measured at least twice using the same arm. Certain conditions can cause a difference in blood pressure between your right and left arms. A blood pressure reading that is higher than normal on one occasion does not mean that you need treatment. If one blood pressure reading  is high, ask your health care provider about having it checked again. TREATMENT  Treating high blood pressure includes making lifestyle changes and possibly taking medication. Living a healthy lifestyle can help lower high blood pressure. You may need to change some of your habits. Lifestyle changes may include:  Following the DASH diet. This diet is high in fruits, vegetables, and whole grains. It is low in salt, red meat, and added sugars.  Getting at least 2 1/2 hours of brisk physical activity every week.  Losing weight if necessary.  Not smoking.  Limiting alcoholic beverages.  Learning ways to reduce stress. If lifestyle changes are not enough to get your blood pressure under control, your health care provider may prescribe medicine. You may need to take more than one. Work closely with your health care provider to understand the risks and benefits. HOME CARE INSTRUCTIONS  Have your blood pressure rechecked as directed by your health care provider.   Only take medicine as directed by your health care provider. Follow the directions carefully. Blood pressure medicines must be taken as prescribed. The medicine does not work as well when you skip doses. Skipping doses also puts you at risk for problems.   Do not smoke.   Monitor your blood pressure at home as directed by your health care provider. SEEK MEDICAL CARE IF:   You think you are having a reaction to medicines taken.  You have recurrent headaches or feel dizzy.  You have swelling in your ankles.  You have trouble with your vision. SEEK IMMEDIATE MEDICAL CARE IF:  You develop a severe headache or   confusion.  You have unusual weakness, numbness, or feel faint.  You have severe chest or abdominal pain.  You vomit repeatedly.  You have trouble breathing. MAKE SURE YOU:   Understand these instructions.  Will watch your condition.  Will get help right away if you are not doing well or get  worse. Document Released: 03/30/2005 Document Revised: 04/04/2013 Document Reviewed: 01/20/2013 ExitCare Patient Information 2015 [employer]. This information is not intended to replace advice given to you by your health care provider. Make sure you discuss any questions you have with your health care provider.  

## 2013-11-02 ENCOUNTER — Ambulatory Visit: Payer: No Typology Code available for payment source

## 2013-11-06 ENCOUNTER — Ambulatory Visit
Admission: RE | Admit: 2013-11-06 | Discharge: 2013-11-06 | Disposition: A | Discharge status: Home / Self Care | Payer: No Typology Code available for payment source | Source: Ambulatory Visit | Attending: Internal Medicine | Admitting: Internal Medicine

## 2013-11-06 ENCOUNTER — Telehealth: Payer: Self-pay | Admitting: *Deleted

## 2013-11-06 DIAGNOSIS — Z0000 Encounter for general adult medical examination without abnormal findings: Secondary | ICD-10-CM

## 2013-11-06 DIAGNOSIS — E781 Pure hyperglyceridemia: Secondary | ICD-10-CM

## 2013-11-06 MED ORDER — OMEGA-3-ACID ETHYL ESTERS 1 G PO CAPS: 2.0000 g | ORAL_CAPSULE | Freq: Two times a day (BID) | ORAL | Status: DC

## 2013-11-06 NOTE — Telephone Encounter (Signed)
Message copied by DUCATTE, LAURENFredderick SeveranceZE L on Mon Nov 06, 2013 12:41 PM ------      Message from: [name], [name] A      Created: Thu Nov 02, 2013  7:14 PM       All labs are normal except her triglycerides are elevated. Please do teachings relating to diet and exercise. Please send Lovaza 2g BID to take before meals. Please send 1 month supply with 3 refills. Thanks ------

## 2013-11-06 NOTE — Telephone Encounter (Signed)
Lab results and instructions reviewed with patient. Patient understands that she will need to make diet and exercise changes as well as pick up Lovaza at our pharmacy and begin taking 1 tab twice daily before meals. Order for Lovaza placed

## 2013-11-16 ENCOUNTER — Telehealth: Payer: Self-pay | Admitting: *Deleted

## 2013-11-16 NOTE — Telephone Encounter (Signed)
Message copied by Fredderick SeveranceUCATTE, LAURENZE L on Thu Nov 16, 2013  3:45 PM ------      Message from: [name], [name] A      Created: Tue Nov 07, 2013 11:07 PM       Normal mammogram ------

## 2013-11-16 NOTE — Telephone Encounter (Signed)
Patient notified of normal mammogram. Requesting nurse visit for BP check. Appt made for 11/29/13 at pt request.

## 2014-01-11 ENCOUNTER — Ambulatory Visit: Payer: Self-pay | Admitting: Internal Medicine

## 2014-01-21 ENCOUNTER — Emergency Department (HOSPITAL_COMMUNITY)
Admission: EM | Admit: 2014-01-21 | Discharge: 2014-01-21 | Disposition: A | Discharge status: Other Acute Inpatient Hospital | Payer: Self-pay | Source: Home / Self Care | Attending: Emergency Medicine | Admitting: Emergency Medicine

## 2014-01-21 ENCOUNTER — Encounter (HOSPITAL_COMMUNITY): Payer: Self-pay | Admitting: Anesthesiology

## 2014-01-21 ENCOUNTER — Encounter (HOSPITAL_COMMUNITY)
Admission: EM | Disposition: A | Discharge status: Home / Self Care | Payer: Self-pay | Source: Home / Self Care | Attending: Emergency Medicine

## 2014-01-21 ENCOUNTER — Ambulatory Visit (HOSPITAL_COMMUNITY)
Admission: EM | Admit: 2014-01-21 | Discharge: 2014-01-21 | Disposition: A | Discharge status: Home / Self Care | Payer: Self-pay | Attending: Emergency Medicine | Admitting: Emergency Medicine

## 2014-01-21 ENCOUNTER — Encounter (HOSPITAL_COMMUNITY): Payer: Self-pay | Admitting: Emergency Medicine

## 2014-01-21 ENCOUNTER — Emergency Department (HOSPITAL_COMMUNITY): Payer: Self-pay | Admitting: Anesthesiology

## 2014-01-21 ENCOUNTER — Emergency Department (HOSPITAL_COMMUNITY): Payer: Self-pay

## 2014-01-21 DIAGNOSIS — M6510 Other infective (teno)synovitis, unspecified site: Secondary | ICD-10-CM

## 2014-01-21 DIAGNOSIS — Z79899 Other long term (current) drug therapy: Secondary | ICD-10-CM | POA: Insufficient documentation

## 2014-01-21 DIAGNOSIS — I10 Essential (primary) hypertension: Secondary | ICD-10-CM | POA: Insufficient documentation

## 2014-01-21 DIAGNOSIS — L02511 Cutaneous abscess of right hand: Secondary | ICD-10-CM

## 2014-01-21 DIAGNOSIS — IMO0001 Reserved for inherently not codable concepts without codable children: Secondary | ICD-10-CM

## 2014-01-21 DIAGNOSIS — F17210 Nicotine dependence, cigarettes, uncomplicated: Secondary | ICD-10-CM | POA: Insufficient documentation

## 2014-01-21 HISTORY — PX: I & D EXTREMITY: SHX5045

## 2014-01-21 LAB — CBC
HCT: 43 % (ref 36.0–46.0)
Hemoglobin: 14.7 g/dL (ref 12.0–15.0)
MCH: 28.1 pg (ref 26.0–34.0)
MCHC: 34.2 g/dL (ref 30.0–36.0)
MCV: 82.2 fL (ref 78.0–100.0)
Platelets: 257 10*3/uL (ref 150–400)
RBC: 5.23 MIL/uL — ABNORMAL HIGH (ref 3.87–5.11)
RDW: 15.6 % — AB (ref 11.5–15.5)
WBC: 5.4 10*3/uL (ref 4.0–10.5)

## 2014-01-21 LAB — BASIC METABOLIC PANEL
Anion gap: 11 (ref 5–15)
BUN: 14 mg/dL (ref 6–23)
CALCIUM: 9.8 mg/dL (ref 8.4–10.5)
CO2: 27 mEq/L (ref 19–32)
CREATININE: 0.8 mg/dL (ref 0.50–1.10)
Chloride: 100 mEq/L (ref 96–112)
GFR, EST NON AFRICAN AMERICAN: 83 mL/min — AB (ref >90)
Glucose, Bld: 91 mg/dL (ref 70–99)
Potassium: 4.4 mEq/L (ref 3.7–5.3)
Sodium: 138 mEq/L (ref 137–147)

## 2014-01-21 SURGERY — IRRIGATION AND DEBRIDEMENT EXTREMITY
Anesthesia: General | Site: Finger | Laterality: Right

## 2014-01-21 MED ORDER — LACTATED RINGERS IV SOLN
INTRAVENOUS | Status: DC | PRN
  Administered 2014-01-21: 19:00:00 via INTRAVENOUS

## 2014-01-21 MED ORDER — FENTANYL CITRATE 0.05 MG/ML IJ SOLN
INTRAMUSCULAR | Status: AC
Start: 2014-01-21 — End: 2014-01-21
  Filled 2014-01-21: qty 5

## 2014-01-21 MED ORDER — BUPIVACAINE HCL (PF) 0.25 % IJ SOLN
INTRAMUSCULAR | Status: DC | PRN
  Administered 2014-01-21: 10 mL

## 2014-01-21 MED ORDER — PROPOFOL 10 MG/ML IV BOLUS
INTRAVENOUS | Status: DC | PRN
  Administered 2014-01-21: 200 mg via INTRAVENOUS

## 2014-01-21 MED ORDER — OXYCODONE-ACETAMINOPHEN 5-325 MG PO TABS: ORAL_TABLET | ORAL | Status: DC

## 2014-01-21 MED ORDER — HYDROMORPHONE HCL 1 MG/ML IJ SOLN: 0.2500 mg | INTRAMUSCULAR | Status: DC | PRN

## 2014-01-21 MED ORDER — ESMOLOL HCL 10 MG/ML IV SOLN
INTRAVENOUS | Status: DC | PRN
  Administered 2014-01-21: 30 mg via INTRAVENOUS
  Administered 2014-01-21 (×2): 20 mg via INTRAVENOUS
  Administered 2014-01-21: 30 mg via INTRAVENOUS

## 2014-01-21 MED ORDER — PROPOFOL 10 MG/ML IV BOLUS
INTRAVENOUS | Status: AC
  Filled 2014-01-21: qty 20

## 2014-01-21 MED ORDER — VANCOMYCIN HCL 1000 MG IV SOLR
1000.0000 mg | INTRAVENOUS | Status: DC | PRN
  Administered 2014-01-21: 1000 mg via INTRAVENOUS

## 2014-01-21 MED ORDER — ONDANSETRON HCL 4 MG/2ML IJ SOLN
INTRAMUSCULAR | Status: AC
  Filled 2014-01-21: qty 2

## 2014-01-21 MED ORDER — VANCOMYCIN HCL IN DEXTROSE 1-5 GM/200ML-% IV SOLN
INTRAVENOUS | Status: AC
  Filled 2014-01-21: qty 200

## 2014-01-21 MED ORDER — MIDAZOLAM HCL 5 MG/5ML IJ SOLN
INTRAMUSCULAR | Status: DC | PRN
  Administered 2014-01-21: 2 mg via INTRAVENOUS

## 2014-01-21 MED ORDER — SUCCINYLCHOLINE CHLORIDE 20 MG/ML IJ SOLN
INTRAMUSCULAR | Status: DC | PRN
  Administered 2014-01-21: 140 mg via INTRAVENOUS

## 2014-01-21 MED ORDER — SULFAMETHOXAZOLE-TRIMETHOPRIM 800-160 MG PO TABS: 1.0000 | ORAL_TABLET | Freq: Two times a day (BID) | ORAL | Status: DC

## 2014-01-21 MED ORDER — SODIUM CHLORIDE 0.9 % IR SOLN
Status: DC | PRN
  Administered 2014-01-21: 1

## 2014-01-21 MED ORDER — DEXTROSE 5 % IV SOLN
INTRAVENOUS | Status: DC | PRN
  Administered 2014-01-21: 20:00:00 via INTRAVENOUS

## 2014-01-21 MED ORDER — ESMOLOL HCL 10 MG/ML IV SOLN
INTRAVENOUS | Status: AC
  Filled 2014-01-21: qty 10

## 2014-01-21 MED ORDER — BUPIVACAINE HCL (PF) 0.25 % IJ SOLN
INTRAMUSCULAR | Status: AC
  Filled 2014-01-21: qty 30

## 2014-01-21 MED ORDER — DEXAMETHASONE SODIUM PHOSPHATE 4 MG/ML IJ SOLN
INTRAMUSCULAR | Status: DC | PRN
  Administered 2014-01-21: 8 mg via INTRAVENOUS

## 2014-01-21 MED ORDER — LIDOCAINE HCL (CARDIAC) 20 MG/ML IV SOLN
INTRAVENOUS | Status: AC
  Filled 2014-01-21: qty 15

## 2014-01-21 MED ORDER — MIDAZOLAM HCL 2 MG/2ML IJ SOLN
INTRAMUSCULAR | Status: AC
  Filled 2014-01-21: qty 2

## 2014-01-21 MED ORDER — LIDOCAINE HCL (CARDIAC) 20 MG/ML IV SOLN
INTRAVENOUS | Status: DC | PRN
  Administered 2014-01-21: 80 mg via INTRAVENOUS

## 2014-01-21 MED ORDER — FENTANYL CITRATE 0.05 MG/ML IJ SOLN
INTRAMUSCULAR | Status: DC | PRN
  Administered 2014-01-21: 100 ug via INTRAVENOUS
  Administered 2014-01-21: 50 ug via INTRAVENOUS
  Administered 2014-01-21: 100 ug via INTRAVENOUS

## 2014-01-21 MED ORDER — ARTIFICIAL TEARS OP OINT
TOPICAL_OINTMENT | OPHTHALMIC | Status: AC
  Filled 2014-01-21: qty 3.5

## 2014-01-21 MED ORDER — ONDANSETRON HCL 4 MG/2ML IJ SOLN
INTRAMUSCULAR | Status: DC | PRN
  Administered 2014-01-21: 4 mg via INTRAVENOUS

## 2014-01-21 MED ORDER — ARTIFICIAL TEARS OP OINT
TOPICAL_OINTMENT | OPHTHALMIC | Status: DC | PRN
  Administered 2014-01-21: 1 via OPHTHALMIC

## 2014-01-21 SURGICAL SUPPLY — 57 items
BANDAGE COBAN STERILE 2 (GAUZE/BANDAGES/DRESSINGS) IMPLANT
BANDAGE ELASTIC 3 VELCRO ST LF (GAUZE/BANDAGES/DRESSINGS) ×1 IMPLANT
BANDAGE ELASTIC 4 VELCRO ST LF (GAUZE/BANDAGES/DRESSINGS) ×1 IMPLANT
BNDG CMPR 9X4 STRL LF SNTH (GAUZE/BANDAGES/DRESSINGS) ×1
BNDG COHESIVE 1X5 TAN STRL LF (GAUZE/BANDAGES/DRESSINGS) ×2 IMPLANT
BNDG CONFORM 2 STRL LF (GAUZE/BANDAGES/DRESSINGS) IMPLANT
BNDG ESMARK 4X9 LF (GAUZE/BANDAGES/DRESSINGS) ×2 IMPLANT
BNDG GAUZE ELAST 4 BULKY (GAUZE/BANDAGES/DRESSINGS) ×1 IMPLANT
CHLORAPREP W/TINT 26ML (MISCELLANEOUS) ×2 IMPLANT
CORDS BIPOLAR (ELECTRODE) ×3 IMPLANT
COVER SURGICAL LIGHT HANDLE (MISCELLANEOUS) ×3 IMPLANT
DECANTER SPIKE VIAL GLASS SM (MISCELLANEOUS) ×3 IMPLANT
DRAIN PENROSE 1/4X12 LTX STRL (WOUND CARE) IMPLANT
DRSG ADAPTIC 3X8 NADH LF (GAUZE/BANDAGES/DRESSINGS) IMPLANT
DRSG EMULSION OIL 3X3 NADH (GAUZE/BANDAGES/DRESSINGS) ×1 IMPLANT
DRSG PAD ABDOMINAL 8X10 ST (GAUZE/BANDAGES/DRESSINGS) ×2 IMPLANT
GAUZE PACKING IODOFORM 1/4X15 (GAUZE/BANDAGES/DRESSINGS) ×2 IMPLANT
GAUZE SPONGE 4X4 12PLY STRL (GAUZE/BANDAGES/DRESSINGS) ×3 IMPLANT
GAUZE XEROFORM 1X8 LF (GAUZE/BANDAGES/DRESSINGS) ×3 IMPLANT
GLOVE BIO SURGEON STRL SZ7.5 (GLOVE) ×3 IMPLANT
GLOVE BIOGEL PI IND STRL 6.5 (GLOVE) IMPLANT
GLOVE BIOGEL PI IND STRL 8 (GLOVE) ×1 IMPLANT
GLOVE BIOGEL PI INDICATOR 6.5 (GLOVE) ×2
GLOVE BIOGEL PI INDICATOR 8 (GLOVE) ×2
GOWN STRL REIN XL XLG (GOWN DISPOSABLE) ×3 IMPLANT
HANDPIECE INTERPULSE COAX TIP (DISPOSABLE)
KIT BASIN OR (CUSTOM PROCEDURE TRAY) ×3 IMPLANT
KIT ROOM TURNOVER OR (KITS) ×3 IMPLANT
LOOP VESSEL MAXI BLUE (MISCELLANEOUS) IMPLANT
LOOP VESSEL MINI RED (MISCELLANEOUS) IMPLANT
MANIFOLD NEPTUNE II (INSTRUMENTS) ×1 IMPLANT
NDL HYPO 25X1 1.5 SAFETY (NEEDLE) IMPLANT
NEEDLE HYPO 25X1 1.5 SAFETY (NEEDLE) ×3 IMPLANT
NS IRRIG 1000ML POUR BTL (IV SOLUTION) ×3 IMPLANT
PACK ORTHO EXTREMITY (CUSTOM PROCEDURE TRAY) ×3 IMPLANT
PAD ARMBOARD 7.5X6 YLW CONV (MISCELLANEOUS) ×6 IMPLANT
SCRUB BETADINE 4OZ XXX (MISCELLANEOUS) ×1 IMPLANT
SET HNDPC FAN SPRY TIP SCT (DISPOSABLE) IMPLANT
SOLUTION BETADINE 4OZ (MISCELLANEOUS) ×1 IMPLANT
SPLINT FINGER (SOFTGOODS) ×2 IMPLANT
SPONGE GAUZE 4X4 12PLY STER LF (GAUZE/BANDAGES/DRESSINGS) ×2 IMPLANT
SPONGE LAP 18X18 X RAY DECT (DISPOSABLE) ×3 IMPLANT
SPONGE LAP 4X18 X RAY DECT (DISPOSABLE) ×1 IMPLANT
SUCTION FRAZIER TIP 10 FR DISP (SUCTIONS) ×1 IMPLANT
SUT ETHILON 4 0 PS 2 18 (SUTURE) ×1 IMPLANT
SUT MON AB 5-0 P3 18 (SUTURE) IMPLANT
SWAB COLLECTION DEVICE MRSA (MISCELLANEOUS) ×2 IMPLANT
SYR CONTROL 10ML LL (SYRINGE) ×2 IMPLANT
TOWEL OR 17X24 6PK STRL BLUE (TOWEL DISPOSABLE) ×3 IMPLANT
TOWEL OR 17X26 10 PK STRL BLUE (TOWEL DISPOSABLE) ×3 IMPLANT
TUBE ANAEROBIC SPECIMEN COL (MISCELLANEOUS) ×2 IMPLANT
TUBE CONNECTING 12'X1/4 (SUCTIONS) ×1
TUBE CONNECTING 12X1/4 (SUCTIONS) ×2 IMPLANT
TUBE FEEDING 5FR 15 INCH (TUBING) IMPLANT
UNDERPAD 30X30 INCONTINENT (UNDERPADS AND DIAPERS) ×3 IMPLANT
WATER STERILE IRR 1000ML POUR (IV SOLUTION) ×3 IMPLANT
YANKAUER SUCT BULB TIP NO VENT (SUCTIONS) ×3 IMPLANT

## 2014-01-21 NOTE — H&P (Signed)
Amanda [name] is an 53 y.o. female.   Chief Complaint: right index finger infection HPI: 53 yo rhd female states she has been having issues with right index finger x 2 weeks.  Does not remember specific injury.  Over past three days has had worsening pain, swelling, erythema.  Seen at [facility] today and sent to [facility] for further evaluation.  Reports no previous injury to right index finger.  No fevers, chills, night sweats.  Does not feel ill.  Past Medical History  Diagnosis Date  . Hypertension     History reviewed. No pertinent past surgical history.  No family history on file. Social History:  reports that she has been smoking Cigarettes.  She has been smoking about 0.00 packs per day. She does not have any smokeless tobacco history on file. She reports that she does not drink alcohol or use illicit drugs.  Allergies: No Known Allergies   (Not in a hospital admission)  Results for orders placed during the hospital encounter of 01/21/14 (from the past 48 hour(s))  CBC     Status: Abnormal   Collection Time    01/21/14  3:17 PM      Result Value Ref Range   WBC 5.4  4.0 - 10.5 K/uL   RBC 5.23 (*) 3.87 - 5.11 MIL/uL   Hemoglobin 14.7  12.0 - 15.0 g/dL   HCT 43.0  36.0 - 46.0 %   MCV 82.2  78.0 - 100.0 fL   MCH 28.1  26.0 - 34.0 pg   MCHC 34.2  30.0 - 36.0 g/dL   RDW 15.6 (*) 11.5 - 15.5 %   Platelets 257  150 - 400 K/uL  BASIC METABOLIC PANEL     Status: Abnormal   Collection Time    01/21/14  3:17 PM      Result Value Ref Range   Sodium 138  137 - 147 mEq/L   Potassium 4.4  3.7 - 5.3 mEq/L   Chloride 100  96 - 112 mEq/L   CO2 27  19 - 32 mEq/L   Glucose, Bld 91  70 - 99 mg/dL   BUN 14  6 - 23 mg/dL   Creatinine, Ser 0.80  0.50 - 1.10 mg/dL   Calcium 9.8  8.4 - 10.5 mg/dL   GFR calc non Af Amer 83 (*) >90 mL/min   GFR calc Af Amer >90  >90 mL/min   Comment: (NOTE)     The eGFR has been calculated using the CKD EPI equation.     This calculation has not been validated  in all clinical situations.     eGFR's persistently <90 mL/min signify possible Chronic Kidney     Disease.   Anion gap 11  5 - 15    Dg Finger Index Right  01/21/2014   CLINICAL DATA:  Right index finger injury 2 weeks ago, discoloration distal interphalangeal joint  EXAM: RIGHT INDEX FINGER 2+V  COMPARISON:  None.  FINDINGS: Three views of the right second finger submitted. No acute fracture or subluxation. No radiopaque foreign body.  IMPRESSION: Negative.   Electronically Signed   By: Lahoma Crocker M.D.   On: 01/21/2014 16:19     A comprehensive review of systems was negative.  Blood pressure 196/101, pulse 86, temperature 98.2 F (36.8 C), temperature source Oral, resp. rate 18, height 5' 4"  (1.626 m), weight 64.411 kg (142 lb), last menstrual period 01/19/2014, SpO2 97.00%.  General appearance: alert, cooperative and appears stated age Head:  Normocephalic, without obvious abnormality, atraumatic Neck: supple, symmetrical, trachea midline Resp: clear to auscultation bilaterally Cardio: regular rate and rhythm GI: non tender Extremities: intact sensation and capillary refill all digits.  +epl/fpl/io.  right index swollen with erythema and fluctuance over middle and distal phalanges.  no tenderness over proximal phalanx.  able to flex pip joint without pain.  ttp middle and distal phalanges.  no proximal streaking.  Skin dry and hardened. Pulses: 2+ and symmetric Skin: Skin color, texture, turgor normal. No rashes or lesions Neurologic: Grossly normal Incision/Wound: none  Assessment/Plan Right index finger abscess.  Recommend OR for incision and drainage right index finger.  Non operative and operative treatment options were discussed with the patient and patient wishes to proceed with operative treatment. Risks, benefits, and alternatives of surgery were discussed and the patient agrees with the plan of care.   Amanda [name] R 01/21/2014, 4:37 PM

## 2014-01-21 NOTE — ED Provider Notes (Signed)
  Chief Complaint   Hand Pain   History of Present Illness   Windle GuardWanda M Tith is a 53 year old female who's had a two-week history of pain and swelling of the distal right index finger. She denies any injury, puncture wound, or foreign body. This just happened spontaneously. It hurts to flex or extend the finger. She denies any fever or chills. Last meal was a half an hour ago.  Review of Systems   Other than as noted above, the patient denies any of the following symptoms: Systemic:  No fevers or chills. Musculoskeletal:  No joint pain or arthritis.  Neurological:  No muscular weakness or paresthesias.  PMFSH   Past medical history, family history, social history, meds, and allergies were reviewed.     Physical Examination   Vital signs:  BP 176/110  Pulse 84  Temp(Src) 97.8 F (36.6 C) (Oral)  Resp 16  SpO2 99% Gen:  Alert and in no distress. Musculoskeletal:  Exam of the hand reveals there is a visible collection of pus on the volar aspect of the right index finger and redness and swelling with pain to palpation distal to the PIP joint. She has pain with any movement of the finger or either flexion or extension.  Otherwise, all joints had a full a ROM with no swelling, bruising or deformity.  No edema, pulses full. Extremities were warm and pink.  Capillary refill was brisk.  Skin:  Clear, warm and dry.  No rash. Neuro:  Alert and oriented.  Muscle strength was normal.  Sensation was intact to light touch.     Assessment   The primary encounter diagnosis was Felon, right. A diagnosis of Infection of tendon sheath was also pertinent to this visit.  Hand surgeon on call today, Dr. Betha LoaKevin Kuzma was called and he wants to meet her at the emergency room of the hospital. Her last meal was half an hour ago, and she was told not to eat or drink anything on her way over to the hospital.  Plan   The patient was transferred to the ED via shuttle in stable condition. Page Dr. Merlyn LotKuzma  upon her arrival.  Medical Decision Making:  The patient is a [age] year old female who's had a two-week history of pain and swelling of the tip of her right index finger. She denies any foreign body or puncture wound. It hurts to move the finger. She does not have a fever. Eye examination the entire distal end of the finger from the PIP distally was swollen, red, and tender with a visible collection of pus overlying the volar aspect of the tip of the finger and erythema and swelling over the middle phalanx. It hurts to bend and extend the finger. Examination is consistent with a felon with extension into the flexor tendon with a septic tendinitis. I have called Dr. Merlyn LotKuzma who wants to meet her at the hospital emergency room, and will take her to the operating room from there. Her last meal was a half an hour ago.      Reuben Likesavid C Rocky Rishel, MD 01/21/14 [phone]

## 2014-01-21 NOTE — Transfer of Care (Signed)
Immediate Anesthesia Transfer of Care Note  Patient: Amanda [name]  Procedure(s) Performed: Procedure(s): IRRIGATION AND DEBRIDEMENT RIGHT INDEX FINGER (Right)  Patient Location: PACU  Anesthesia Type:General  Level of Consciousness: awake, oriented, sedated, patient cooperative and responds to stimulation  Airway & Oxygen Therapy: Patient Spontanous Breathing and Patient connected to nasal cannula oxygen  Post-op Assessment: Report given to PACU RN, Post -op Vital signs reviewed and stable, Patient moving all extremities and Patient moving all extremities X 4  Post vital signs: Reviewed and stable  Complications: No apparent anesthesia complications

## 2014-01-21 NOTE — Anesthesia Postprocedure Evaluation (Signed)
  Anesthesia Post-op Note  Patient: Amanda [name]  Procedure(s) Performed: Procedure(s): IRRIGATION AND DEBRIDEMENT RIGHT INDEX FINGER (Right)  Patient Location: PACU  Anesthesia Type:General  Level of Consciousness: awake  Airway and Oxygen Therapy: Patient Spontanous Breathing  Post-op Pain: mild  Post-op Assessment: Post-op Vital signs reviewed  Post-op Vital Signs: Reviewed  Last Vitals:  Filed Vitals:   01/21/14 2130  BP: 165/90  Pulse: 87  Temp: 36.6 C  Resp: 24    Complications: No apparent anesthesia complications

## 2014-01-21 NOTE — Discharge Instructions (Signed)
We have determined that your problem requires further evaluation in the emergency department.  We will take care of your transport there.  Once at the emergency department, you will be evaluated by a provider and they will order whatever treatment or tests they deem necessary.  We cannot guarantee that they will do any specific test or do any specific treatment.  ° °

## 2014-01-21 NOTE — Anesthesia Preprocedure Evaluation (Signed)
Anesthesia Evaluation  Patient identified by MRN, date of birth, ID band Patient awake    Reviewed: Allergy & Precautions, H&P , NPO status , Patient's Chart, lab work & pertinent test results  Airway Mallampati: II      Dental   Pulmonary Current Smoker,          Cardiovascular hypertension,     Neuro/Psych    GI/Hepatic negative GI ROS, Neg liver ROS,   Endo/Other    Renal/GU negative Renal ROS     Musculoskeletal   Abdominal   Peds  Hematology   Anesthesia Other Findings   Reproductive/Obstetrics                           Anesthesia Physical Anesthesia Plan  ASA: III  Anesthesia Plan: General   Post-op Pain Management:    Induction: Intravenous  Airway Management Planned: Oral ETT  Additional Equipment:   Intra-op Plan:   Post-operative Plan: Extubation in OR  Informed Consent: I have reviewed the patients History and Physical, chart, labs and discussed the procedure including the risks, benefits and alternatives for the proposed anesthesia with the patient or authorized representative who has indicated his/her understanding and acceptance.   Dental advisory given  Plan Discussed with: Anesthesiologist and CRNA  Anesthesia Plan Comments:         Anesthesia Quick Evaluation

## 2014-01-21 NOTE — ED Notes (Addendum)
C/o 2 week duration of pain in right index finger . Unsure of problem, but finger is swollen, discolored. States her last tetanus was over the Summer, as her 10 year shot was due (noted to be 10-11-2013 in records)

## 2014-01-21 NOTE — Op Note (Signed)
[id number] 

## 2014-01-21 NOTE — ED Notes (Signed)
OR called about when pt to be transported.

## 2014-01-21 NOTE — Brief Op Note (Signed)
01/21/2014  8:20 PM  PATIENT:  Amanda [name]  53 y.o. female  PRE-OPERATIVE DIAGNOSIS:  infection right index finger   POST-OPERATIVE DIAGNOSIS:  Infection right index finger  PROCEDURE:  Procedure(s): IRRIGATION AND DEBRIDEMENT RIGHT INDEX FINGER (Right)  SURGEON:  Surgeon(s) and Role:    * Betha LoaKevin Stephanee Barcomb, MD - Primary  PHYSICIAN ASSISTANT:   ASSISTANTS: none   ANESTHESIA:   general  EBL:     BLOOD ADMINISTERED:none  DRAINS: iodoform packing  LOCAL MEDICATIONS USED:  MARCAINE     SPECIMEN:  Source of Specimen:  right index finger  DISPOSITION OF SPECIMEN:  micro  COUNTS:  YES  TOURNIQUET:   Total Tourniquet Time Documented: Upper Arm (Right) - 13 minutes Total: Upper Arm (Right) - 13 minutes   DICTATION: .Other Dictation: Dictation Number [id number]  PLAN OF CARE: Discharge to home after PACU  PATIENT DISPOSITION:  PACU - hemodynamically stable.

## 2014-01-21 NOTE — ED Notes (Signed)
Consent signed and at bedside  

## 2014-01-21 NOTE — Discharge Instructions (Signed)

## 2014-01-21 NOTE — Op Note (Signed)
[place] [address]:  [name], Amanda                ACCOUNT NO.:  [id number]  MEDICAL RECORD NO.:  [id number]  LOCATION:  [facility]                         FACILITY:  [facility]  PHYSICIAN:  Betha LoaKevin Jatasia Gundrum, MD        DATE OF BIRTH:  [birth year]  DATE OF PROCEDURE:  01/21/2014 DATE OF DISCHARGE:  01/21/2014                              OPERATIVE REPORT   PREOPERATIVE DIAGNOSIS:  Right index finger abscess.  POSTOPERATIVE DIAGNOSIS:  Right index finger abscess.  PROCEDURE:  Incision and drainage right index finger abscess at middle and distal phalanx.  SURGEON:  Betha LoaKevin Yolonda Purtle, MD  ASSISTANT:  None.  ANESTHESIA:  General.  IV FLUIDS:  Per anesthesia flow sheet.  ESTIMATED BLOOD LOSS:  Minimal.  COMPLICATIONS:  None.  SPECIMENS:  None.  TOURNIQUET TIME:  13 minutes.  DISPOSITION:  Stable to PACU.  INDICATIONS:  Amanda [name] is a 53 year old female who states for approximately 2 weeks, she has been having issues with her right index finger.  Over the past few days, it has gotten worse and become more swollen, erythematous, and painful.  No fevers, chills, or night sweats. On examination, she had a fluctuant area on the volar aspect of the index finger.  She was able to flex and extend at the PIP joint without pain.  I recommended going to the operating room for incision and drainage of the finger.  Risks, benefits, and alternatives of the surgery were discussed including risk of blood loss; infection; damage to nerves, vessels, tendons, ligaments, bone; failure of procedure; need for additional surgery; complications with wound healing; continued pain; continued infection; need for repeat irrigation and debridement. She voiced understanding of these risks and elected to proceed.  OPERATIVE COURSE:  After being identified preoperatively by myself, the patient and I agreed upon procedure and site of procedure.  Surgical site was marked.  The risks, benefits, and alternatives of surgery were reviewed and  she wished to proceed.  Surgical consent had been signed. Antibiotics were held for cultures.  She was transferred to the operating room and placed on the operating room table in supine position with the right upper extremity on arm board.  General anesthesia was induced by the anesthesiologist.  The right upper extremity was prepped and draped in normal sterile orthopedic fashion.  A surgical pause was performed between surgeons, anesthesia, operating room staff, and all were in agreement as to the patient, procedure, and site of procedure. Tourniquet at the proximal aspect of the extremity was inflated to 250 mmHg after exsanguination of the limb with Esmarch bandage.  Incision was made at the radial side of the pad of the index finger where the air was fluctuant.  The epidermis was very thickened.  There was purulent material underneath.  This was cultured for aerobes and anaerobes.  The thickened epidermis was removed.  There was dermis underneath.  An incision was made at the radial side of the pad of the distal phalanx and at the middle phalanx.  An additional incision was made at the volar aspect of the middle phalanx.  These were all carried in the subcutaneous tissues by spreading technique.  No gross purulence was encountered in  the subcutaneous tissues.  The wounds were copiously irrigated with sterile saline.  They were packed with quarter-inch iodoform gauze.  They were then dressed with sterile Xeroform, 4x4s, and wrapped with a Coban dressing lightly.  An AlumaFoam splint was placed and wrapped with a Coban dressing lightly.  A digital block was performed with 10 mL of 0.25% plain Marcaine to aid in postoperative analgesia.  Vancomycin was given after cultures were taken.  The operative drapes were broken down.  The patient was awoken from anesthesia safely.  Tourniquet was deflated at 13 minutes.  She was transferred back to stretcher and taken to PACU in stable condition.   I will see her back in the office in 3-4 days for postoperative followup. I will give her Percocet 5/325, 1-2 p.o. q.6 hours p.r.n. pain, dispensed #40, and Bactrim DS 1 p.o. b.i.d. x7 days.     Betha LoaKevin Ciria Bernardini, MD     KK/MEDQ  D:  01/21/2014  T:  01/21/2014  Job:  [id number]

## 2014-01-21 NOTE — ED Provider Notes (Signed)
CSN: [id number]     Arrival date & time 01/21/14  1403 History   First MD Initiated Contact with Patient 01/21/14 1448     Chief Complaint  Patient presents with  . Finger Injury      HPI Patient reports worsening swelling of her right index finger the past 2 weeks.  No known trauma or injury to this area.  She was seen at urgent care and was sent down for the possibility of a right finger abscess.  Patient was accepted by Dr. Merlyn LotKuzma.  No fevers or chills.   Past Medical History  Diagnosis Date  . Hypertension    History reviewed. No pertinent past surgical history. No family history on file. History  Substance Use Topics  . Smoking status: Current Some Day Smoker    Types: Cigarettes  . Smokeless tobacco: Not on file  . Alcohol Use: No   OB History   Grav Para Term Preterm Abortions TAB SAB Ect Mult Living                 Review of Systems  All other systems reviewed and are negative.     Allergies  Review of patient's allergies indicates no known allergies.  Home Medications   Prior to Admission medications   Medication Sig Start Date End Date Taking? Authorizing Provider  Acetaminophen (TYLENOL PO) Take 2 tablets by mouth every 4 (four) hours as needed. For pain    Historical Provider, MD  albuterol (PROVENTIL HFA;VENTOLIN HFA) 108 (90 BASE) MCG/ACT inhaler Inhale 2 puffs into the lungs every 4 (four) hours as needed for wheezing. 07/24/13   Graylon GoodZachary H Baker, PA-C  azithromycin (ZITHROMAX Z-PAK) 250 MG tablet 2 po day one, then 1 daily x 4 days 05/15/12   Stevie Kernyan McLennan, MD  azithromycin (ZITHROMAX Z-PAK) 250 MG tablet Use as directed 07/24/13   Graylon GoodZachary H Baker, PA-C  cephALEXin (KEFLEX) 500 MG capsule Take 2 capsules (1,000 mg total) by mouth 2 (two) times daily. 05/15/12   Stevie Kernyan McLennan, MD  cyclobenzaprine (FLEXERIL) 10 MG tablet Take 1 tablet (10 mg total) by mouth 3 (three) times daily as needed for muscle spasms. 10/11/13   Ambrose FinlandValerie A Keck, NP  GuaiFENesin (MUCINEX PO)  Take 2 tablets by mouth every 12 (twelve) hours as needed. For congestion    Historical Provider, MD  guaiFENesin-codeine 100-10 MG/5ML syrup Take 10 mLs by mouth every 4 (four) hours as needed for cough. 07/24/13   Graylon GoodZachary H Baker, PA-C  hydrochlorothiazide (HYDRODIURIL) 25 MG tablet Take 0.5 tablets (12.5 mg total) by mouth daily. 10/11/13   Ambrose FinlandValerie A Keck, NP  metroNIDAZOLE (FLAGYL) 500 MG tablet Take 1 tablet (500 mg total) by mouth 2 (two) times daily. 10/11/13   Ambrose FinlandValerie A Keck, NP  omega-3 acid ethyl esters (LOVAZA) 1 G capsule Take 2 capsules (2 g total) by mouth 2 (two) times daily. 11/06/13   Ambrose FinlandValerie A Keck, NP  OVER THE COUNTER MEDICATION Take 1 Container by mouth every 4 (four) hours as needed. Night time cough medicine for cough    Historical Provider, MD  Spacer/Aero-Holding Chambers (AEROCHAMBER PLUS FLO-VU LARGE) MISC 1 each by Other route once. 07/24/13   Adrian BlackwaterZachary H Baker, PA-C   BP 196/101  Pulse 86  Temp(Src) 98.2 F (36.8 C) (Oral)  Resp 18  Ht 5\' 4"  (1.626 m)  Wt 142 lb (64.411 kg)  BMI 24.36 kg/m2  SpO2 97%  LMP 01/21/2014 Physical Exam  Nursing note and vitals reviewed. Constitutional: She  is oriented to person, place, and time. She appears well-developed and well-nourished.  HENT:  Head: Normocephalic.  Eyes: EOM are normal.  Neck: Normal range of motion.  Pulmonary/Chest: Effort normal.  Abdominal: She exhibits no distension.  Musculoskeletal: Normal range of motion.  Focus swelling of the volar surface of the right index finger involving both the middle and distal phalanx.  Evidence of deep abscess and mild spreading redness.  Some pain with flexion of the right index finger.  Neurological: She is alert and oriented to person, place, and time.  Psychiatric: She has a normal mood and affect.    ED Course  Procedures (including critical care time) Labs Review Labs Reviewed  CBC  BASIC METABOLIC PANEL    Imaging Review No results found.   EKG  Interpretation None      MDM   Final diagnoses:  None    Right finger abscess.  Dr. Merlyn LotKuzma to see. labs ordered.  N.p.o. status.  X-ray of the right index finger.    Lyanne CoKevin M Shantavia Jha, MD 01/21/14 1500

## 2014-01-21 NOTE — ED Notes (Signed)
Pt sent from urgent care for further eval of pain to right index finger ongoing for 2 weeks. Pt presents with green and swollen right index finger and swelling to right middle finger. Pt took 3 extra strength tylenol and 2 advil sleep aids. Pt has not had her BP medications for about 2 weeks.

## 2014-01-22 ENCOUNTER — Encounter (HOSPITAL_COMMUNITY): Payer: Self-pay | Admitting: Orthopedic Surgery

## 2014-01-24 LAB — CULTURE, ROUTINE-ABSCESS

## 2014-01-26 LAB — ANAEROBIC CULTURE

## 2014-10-08 ENCOUNTER — Emergency Department (INDEPENDENT_AMBULATORY_CARE_PROVIDER_SITE_OTHER): Payer: Self-pay

## 2014-10-08 ENCOUNTER — Encounter (HOSPITAL_COMMUNITY): Payer: Self-pay | Admitting: Emergency Medicine

## 2014-10-08 ENCOUNTER — Emergency Department (INDEPENDENT_AMBULATORY_CARE_PROVIDER_SITE_OTHER)
Admission: EM | Admit: 2014-10-08 | Discharge: 2014-10-08 | Disposition: A | Discharge status: Home / Self Care | Payer: Self-pay | Source: Home / Self Care | Attending: Family Medicine | Admitting: Family Medicine

## 2014-10-08 DIAGNOSIS — S61219A Laceration without foreign body of unspecified finger without damage to nail, initial encounter: Secondary | ICD-10-CM

## 2014-10-08 MED ORDER — CEPHALEXIN 500 MG PO CAPS: 500.0000 mg | ORAL_CAPSULE | Freq: Four times a day (QID) | ORAL | Status: DC

## 2014-10-08 NOTE — ED Provider Notes (Signed)
CSN: [id number]     Arrival date & time 10/08/14  1330 History   First MD Initiated Contact with Patient 10/08/14 1420     Chief Complaint  Patient presents with  . Laceration   (Consider location/radiation/quality/duration/timing/severity/associated sxs/prior Treatment) Patient is a 54 y.o. female presenting with skin laceration. The history is provided by the patient.  Laceration Location:  Finger Finger laceration location:  L ring finger Depth:  Cutaneous Quality: jagged   Bleeding: controlled   Time since incident:  2 days Injury mechanism: cleaning dishes and cut finger on scouring pad. Pain details:    Progression:  Worsening Foreign body present:  Unable to specify Tetanus status:  Up to date   Past Medical History  Diagnosis Date  . Hypertension    Past Surgical History  Procedure Laterality Date  . I&d extremity Right 01/21/2014    Procedure: IRRIGATION AND DEBRIDEMENT RIGHT INDEX FINGER;  Surgeon: Betha LoaKevin Kuzma, MD;  Location: [facility] OR;  Service: Orthopedics;  Laterality: Right;   No family history on file. History  Substance Use Topics  . Smoking status: Current Some Day Smoker    Types: Cigarettes  . Smokeless tobacco: Not on file  . Alcohol Use: No   OB History    No data available     Review of Systems  Constitutional: Negative.   Skin: Positive for wound.    Allergies  Review of patient's allergies indicates no known allergies.  Home Medications   Prior to Admission medications   Medication Sig Start Date End Date Taking? Authorizing Provider  albuterol (PROVENTIL HFA;VENTOLIN HFA) 108 (90 BASE) MCG/ACT inhaler Inhale 2 puffs into the lungs every 4 (four) hours as needed for wheezing. 07/24/13   Graylon GoodZachary H Baker, PA-C  aspirin 325 MG tablet Take 975 mg by mouth once.    Historical Provider, MD  cephALEXin (KEFLEX) 500 MG capsule Take 1 capsule (500 mg total) by mouth 4 (four) times daily. Take all of medicine and drink lots of fluids 10/08/14   Linna HoffJames  D Errin Chewning, MD  cyclobenzaprine (FLEXERIL) 10 MG tablet Take 1 tablet (10 mg total) by mouth 3 (three) times daily as needed for muscle spasms. 10/11/13   Ambrose FinlandValerie A Keck, NP  hydrochlorothiazide (HYDRODIURIL) 25 MG tablet Take 0.5 tablets (12.5 mg total) by mouth daily. 10/11/13   Ambrose FinlandValerie A Keck, NP  hydrocortisone cream 1 % Apply 1 application topically daily as needed for itching (Applied to finger).    Historical Provider, MD  ibuprofen (ADVIL,MOTRIN) 200 MG tablet Take 400 mg by mouth once.    Historical Provider, MD  OVER THE COUNTER MEDICATION Take 1 capsule by mouth once. OTC Pain medication    Historical Provider, MD  oxyCODONE-acetaminophen (PERCOCET) 5-325 MG per tablet 1-2 tabs po q6 hours prn pain 01/21/14   Betha LoaKevin Kuzma, MD  Spacer/Aero-Holding Chambers (AEROCHAMBER PLUS FLO-VU LARGE) MISC 1 each by Other route once. 07/24/13   Graylon GoodZachary H Baker, PA-C  sulfamethoxazole-trimethoprim (BACTRIM DS) 800-160 MG per tablet Take 1 tablet by mouth 2 (two) times daily. 01/21/14   Betha LoaKevin Kuzma, MD   BP 188/116 mmHg  Pulse 71  Temp(Src) 98.5 F (36.9 C) (Oral)  Resp 14  SpO2 99%  LMP 10/04/2014 Physical Exam  Constitutional: She is oriented to person, place, and time. She appears well-developed and well-nourished.  Musculoskeletal: She exhibits tenderness.  Lac to middle phalanx of rrf , tender sts, sl exudate,   Neurological: She is alert and oriented to person, place, and time.  Skin: Skin is warm and dry. There is erythema.  Nursing note and vitals reviewed.   ED Course  Procedures (including critical care time) Labs Review Labs Reviewed - No data to display  Imaging Review Dg Finger Ring Right  10/08/2014   CLINICAL DATA:  Laceration right ring finger with pain and swelling Initial encounter.  EXAM: RIGHT RING FINGER 2+V  COMPARISON:  None.  FINDINGS: There soft tissue swelling about the right ring finger worst along the midshaft of the middle phalanx. No soft tissue gas collection, bony  destructive change or periostitis is identified. No foreign body is seen. There is no fracture or dislocation.  IMPRESSION: Soft tissue swelling right ring finger without evidence of osteomyelitis or foreign body.   Electronically Signed   By: Drusilla Kanner M.D.   On: 10/08/2014 15:26   X-rays reviewed and report per radiologist.   MDM   1. Finger laceration, initial encounter    Wound care to rrf.    Linna Hoff, MD 10/09/14 2045

## 2014-10-08 NOTE — Discharge Instructions (Signed)
Soak in warm water twice a day, take antibiotic as prescribed-start today, return on wed for recheck.

## 2014-10-08 NOTE — ED Notes (Addendum)
Laceration to right ring finger.  Finger is swollen, skin around wound looks white from too much moisture.  Patient reports she injured finger 2 days ago with scouring pad while washing dishes

## 2015-01-02 ENCOUNTER — Encounter (HOSPITAL_COMMUNITY): Payer: Self-pay | Admitting: *Deleted

## 2015-01-02 ENCOUNTER — Emergency Department (HOSPITAL_COMMUNITY)
Admission: EM | Admit: 2015-01-02 | Discharge: 2015-01-02 | Disposition: A | Discharge status: Home / Self Care | Payer: No Typology Code available for payment source | Attending: Emergency Medicine | Admitting: Emergency Medicine

## 2015-01-02 DIAGNOSIS — Z79899 Other long term (current) drug therapy: Secondary | ICD-10-CM | POA: Diagnosis not present

## 2015-01-02 DIAGNOSIS — Y9389 Activity, other specified: Secondary | ICD-10-CM | POA: Insufficient documentation

## 2015-01-02 DIAGNOSIS — I10 Essential (primary) hypertension: Secondary | ICD-10-CM | POA: Insufficient documentation

## 2015-01-02 DIAGNOSIS — S3992XA Unspecified injury of lower back, initial encounter: Secondary | ICD-10-CM | POA: Insufficient documentation

## 2015-01-02 DIAGNOSIS — Z720 Tobacco use: Secondary | ICD-10-CM | POA: Insufficient documentation

## 2015-01-02 DIAGNOSIS — S199XXA Unspecified injury of neck, initial encounter: Secondary | ICD-10-CM | POA: Diagnosis not present

## 2015-01-02 DIAGNOSIS — Z7982 Long term (current) use of aspirin: Secondary | ICD-10-CM | POA: Diagnosis not present

## 2015-01-02 DIAGNOSIS — Y998 Other external cause status: Secondary | ICD-10-CM | POA: Insufficient documentation

## 2015-01-02 DIAGNOSIS — Y92410 Unspecified street and highway as the place of occurrence of the external cause: Secondary | ICD-10-CM | POA: Diagnosis not present

## 2015-01-02 DIAGNOSIS — Z792 Long term (current) use of antibiotics: Secondary | ICD-10-CM | POA: Insufficient documentation

## 2015-01-02 DIAGNOSIS — M545 Low back pain, unspecified: Secondary | ICD-10-CM

## 2015-01-02 DIAGNOSIS — M542 Cervicalgia: Secondary | ICD-10-CM
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
## 2015-01-02 NOTE — Discharge Instructions (Signed)
Please use ibuprofen or tylenol as needed for pain
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
## 2015-06-17 ENCOUNTER — Encounter (HOSPITAL_COMMUNITY): Payer: Self-pay | Admitting: *Deleted

## 2015-06-17 ENCOUNTER — Emergency Department (HOSPITAL_COMMUNITY)
Admission: EM | Admit: 2015-06-17 | Discharge: 2015-06-17 | Disposition: A | Discharge status: Home / Self Care | Payer: No Typology Code available for payment source | Attending: Emergency Medicine | Admitting: Emergency Medicine

## 2015-06-17 DIAGNOSIS — R21 Rash and other nonspecific skin eruption: Secondary | ICD-10-CM | POA: Insufficient documentation

## 2015-06-17 DIAGNOSIS — Z792 Long term (current) use of antibiotics: Secondary | ICD-10-CM | POA: Insufficient documentation

## 2015-06-17 DIAGNOSIS — Z79899 Other long term (current) drug therapy: Secondary | ICD-10-CM | POA: Insufficient documentation

## 2015-06-17 DIAGNOSIS — R040 Epistaxis: Secondary | ICD-10-CM | POA: Insufficient documentation

## 2015-06-17 DIAGNOSIS — J069 Acute upper respiratory infection, unspecified: Secondary | ICD-10-CM

## 2015-06-17 DIAGNOSIS — Z7982 Long term (current) use of aspirin: Secondary | ICD-10-CM | POA: Insufficient documentation

## 2015-06-17 DIAGNOSIS — L539 Erythematous condition, unspecified: Secondary | ICD-10-CM | POA: Insufficient documentation

## 2015-06-17 DIAGNOSIS — F17210 Nicotine dependence, cigarettes, uncomplicated: Secondary | ICD-10-CM | POA: Insufficient documentation

## 2015-06-17 DIAGNOSIS — I10 Essential (primary) hypertension: Secondary | ICD-10-CM | POA: Insufficient documentation

## 2015-06-17 MED ORDER — DEXAMETHASONE 4 MG PO TABS
10.0000 mg | ORAL_TABLET | Freq: Once | ORAL | Status: AC
Start: 2015-06-17 — End: 2015-06-17
  Administered 2015-06-17: 10 mg via ORAL
  Filled 2015-06-17: qty 3

## 2015-06-17 MED ORDER — OXYMETAZOLINE HCL 0.05 % NA SOLN: 1.0000 | Freq: Two times a day (BID) | NASAL | Status: DC

## 2015-06-17 MED ORDER — HYDROCODONE-HOMATROPINE 5-1.5 MG/5ML PO SYRP: 5.0000 mL | ORAL_SOLUTION | Freq: Four times a day (QID) | ORAL | Status: DC | PRN

## 2015-06-17 MED ORDER — AMLODIPINE BESYLATE 5 MG PO TABS: 10.0000 mg | ORAL_TABLET | Freq: Every day | ORAL | Status: DC

## 2015-06-17 NOTE — ED Notes (Signed)
PT reports a bumps all over body . Multiple site of raised skin on Arms, legs ,buttocks ,scalp, nostril ,back.  Pt also reports a productive cough.

## 2015-06-17 NOTE — ED Provider Notes (Signed)
CSN: [id number]     Arrival date & time 06/17/15  0805 History   First MD Initiated Contact with Patient 06/17/15 0809     Chief Complaint  Patient presents with  . Cough  . Skin Problem  . Epistaxis     (Consider location/radiation/quality/duration/timing/severity/associated sxs/prior Treatment) HPI   Ms. Augusto GarbeBaily is a 55 year old with a PMH of HTN (not taking medications) who presents with a cough, epistaxis, and rash. She reports that a dry cough, sore throat, runny nose started about a week ago. She says she has since lost her voice. She said her husband had a cold.  She has taken Nyquil and mucinex to minimal effect. She also reports several episodes of nose bleeding. She points out that her rash started a couple days ago after the onset of her URI symptoms.She also She thought it was due to a spider bite at first but now thinks it is something else. They are more painful than pruritic. She has not tried any new detergents or creams. She denies any recent time in the woods. She has sex only with her husband, who she said is likely promiscuous. She denies any current or remote IVDU or insufflated drug use. She used to use marijuana. She smokes 3 cigarettes a day and drinks alcohol occasionally.  Past Medical History  Diagnosis Date  . Hypertension    Past Surgical History  Procedure Laterality Date  . I&d extremity Right 01/21/2014    Procedure: IRRIGATION AND DEBRIDEMENT RIGHT INDEX FINGER;  Surgeon: Betha LoaKevin Kuzma, MD;  Location: [facility] OR;  Service: Orthopedics;  Laterality: Right;   History reviewed. No pertinent family history. Social History  Substance Use Topics  . Smoking status: Current Some Day Smoker    Types: Cigarettes  . Smokeless tobacco: Never Used  . Alcohol Use: Yes     Comment: 3 times a week   OB History    No data available     Review of Systems  Constitutional: Negative for fever, chills and fatigue.  HENT: Positive for congestion, nosebleeds, rhinorrhea and  sore throat.   Eyes: Negative for photophobia and visual disturbance.  Respiratory: Positive for cough and wheezing. Negative for shortness of breath.   Cardiovascular: Negative for chest pain and leg swelling.  Endocrine: Negative for polydipsia and polyuria.  Genitourinary: Negative for dysuria, decreased urine volume and pelvic pain.  Musculoskeletal: Negative for joint swelling and arthralgias.  Skin: Positive for rash.  Neurological: Negative for dizziness, light-headedness and headaches.  Psychiatric/Behavioral: Negative for dysphoric mood. The patient is not nervous/anxious.   All other systems reviewed and are negative.     Allergies  Review of patient's allergies indicates no known allergies.  Home Medications   Prior to Admission medications   Medication Sig Start Date End Date Taking? Authorizing Provider  albuterol (PROVENTIL HFA;VENTOLIN HFA) 108 (90 BASE) MCG/ACT inhaler Inhale 2 puffs into the lungs every 4 (four) hours as needed for wheezing. 07/24/13   Graylon GoodZachary H Baker, PA-C  aspirin 325 MG tablet Take 975 mg by mouth once.    Historical Provider, MD  cephALEXin (KEFLEX) 500 MG capsule Take 1 capsule (500 mg total) by mouth 4 (four) times daily. Take all of medicine and drink lots of fluids 10/08/14   Linna HoffJames D Kindl, MD  cyclobenzaprine (FLEXERIL) 10 MG tablet Take 1 tablet (10 mg total) by mouth 3 (three) times daily as needed for muscle spasms. 10/11/13   Ambrose FinlandValerie A Keck, NP  hydrochlorothiazide (HYDRODIURIL) 25 MG  tablet Take 0.5 tablets (12.5 mg total) by mouth daily. 10/11/13   Ambrose Finland, NP  hydrocortisone cream 1 % Apply 1 application topically daily as needed for itching (Applied to finger).    Historical Provider, MD  ibuprofen (ADVIL,MOTRIN) 200 MG tablet Take 400 mg by mouth once.    Historical Provider, MD  OVER THE COUNTER MEDICATION Take 1 capsule by mouth once. OTC Pain medication    Historical Provider, MD  oxyCODONE-acetaminophen (PERCOCET) 5-325 MG per  tablet 1-2 tabs po q6 hours prn pain 01/21/14   Betha Loa, MD  Spacer/Aero-Holding Chambers (AEROCHAMBER PLUS FLO-VU LARGE) MISC 1 each by Other route once. 07/24/13   Graylon Good, PA-C  sulfamethoxazole-trimethoprim (BACTRIM DS) 800-160 MG per tablet Take 1 tablet by mouth 2 (two) times daily. 01/21/14   Betha Loa, MD   BP 193/117 mmHg  Temp(Src) 97.9 F (36.6 C) (Oral)  Resp 20  Ht  (1.626 m)  Wt 63.504 kg  BMI 24.02 kg/m2  SpO2 97%  LMP 05/23/2015 Physical Exam  Constitutional: She appears well-developed and well-nourished. No distress.  HENT:  Head: Normocephalic and atraumatic.  Mouth/Throat: No oropharyngeal exudate.  Tonsillar erythema. Nostrils erythematous.  Eyes: EOM are normal. Pupils are equal, round, and reactive to light. No scleral icterus.  Neck: Normal range of motion.  Cardiovascular: Normal rate, regular rhythm and normal heart sounds.  Exam reveals no friction rub.   No murmur heard. Pulmonary/Chest: Effort normal. No respiratory distress.  Faint scattered wheezes  Abdominal: Soft. Bowel sounds are normal. She exhibits no distension. There is no tenderness.  Musculoskeletal: Normal range of motion. She exhibits no edema.  Lymphadenopathy:    She has no cervical adenopathy.  Neurological: She is alert. She has normal reflexes.  Skin: Skin is warm and dry. Rash noted. She is not diaphoretic.  Diffuse, erythemaous, non-scaling papules, small plaques, and rare streaks in upper and lower extremities, sparing the trunk. Single papule noted on face, one possibly in the right nostril.  Psychiatric: She has a normal mood and affect. Her behavior is normal.  Nursing note and vitals reviewed.   ED Course  Procedures (including critical care time) Labs Review Labs Reviewed - No data to display  Imaging Review No results found. I have personally reviewed and evaluated these images and lab results as part of my medical decision-making.   EKG  Interpretation None      MDM   Final diagnoses:  None   Patient is 7 days into an upper respiratory infection and has developed laryngitis, will provide 10 mg po decadron to help with sore throat. Patient noted to have high BP to 190s/110s with known history of HTN without antihypertensives. She denies any headache, visual changes, confusion, decreased urine output, dyspnea, or chest pain. It is likely this is a baseline BP for her. Will provide a month's supply of 5 mg on discharge. With respect to rash, the differential includes insect bite, infectious, or autoimmune. Consideration is syphilis in setting of promiscuous partner, but rash isn't restricted to palms or soles. She denies any fever. Will obtain RPR, HCV ab(lichen planus, screening), and HIV for follow up by patient and outpatient provider.  I encouraged her to follow up with [facility] as as soon as possible to follow up on the rash, her uncontrolled HTN, and the resolution of her HTN symptoms. She already has an appt scheduled, and I advised her to attempt to make it sooner. Hycodan syrup and  amlodipine prescriptions were provided on discharge. Instructions were given. She was discharged in good condition.    Ruben Im, MD 06/17/15 1014  Marily Memos, MD 06/17/15 [phone]

## 2015-06-17 NOTE — Discharge Instructions (Signed)
Ms. Augusto GarbeBaily, it was a pleasure seeing you today. For your rash, please follow up with your primary doctor. For your cough, we will prescribe a cough syrup stronger than Nyquil. For your nosebleeds, you will use the Afrin nasal spray. You also have high blood pressure, and I will prescribe a month supply of amlodipine. Please follow up with your primary doctor to treat your high blood pressure and follow up on your rash and cold.

## 2015-06-17 NOTE — ED Provider Notes (Signed)
I saw and evaluated the patient, reviewed the resident's note and I agree with the findings and plan.  Here with multiple problems.  Cough and epistaxis likely related to URI. Also with laryngitis associated with that. Likely viral at this point. Lungs overall clear, will tx w/ supportive care. No e/o pneumonia.  Rash - unsure of cause, appears to be punched out lesions diffusely in varying stages of healing. Doubt emergent cause of rash, but 2/2 uncertainty in cause, will check syphillis/HIV/hepatitis and basic labs with close derm/PCP follow up.   Marily MemosJason Ottilia Pippenger, MD 06/17/15 [phone]

## 2015-06-17 NOTE — ED Notes (Signed)
Patient d/c'd from monitor, continuous pulse oximetry and blood pressure cuff; patient getting dressed to be discharged home 

## 2015-06-18 LAB — RPR: RPR Ser Ql: NONREACTIVE

## 2015-06-18 LAB — HEPATITIS PANEL, ACUTE
HEP A IGM: NEGATIVE
HEP B S AG: NEGATIVE
Hep B C IgM: NEGATIVE

## 2015-06-18 LAB — HIV ANTIBODY (ROUTINE TESTING W REFLEX): HIV Screen 4th Generation wRfx: NONREACTIVE

## 2016-01-07 ENCOUNTER — Emergency Department (HOSPITAL_COMMUNITY)
Admission: EM | Admit: 2016-01-07 | Discharge: 2016-01-07 | Disposition: A | Discharge status: Home / Self Care | Payer: Self-pay | Attending: Emergency Medicine | Admitting: Emergency Medicine

## 2016-01-07 ENCOUNTER — Encounter (HOSPITAL_COMMUNITY): Payer: Self-pay

## 2016-01-07 ENCOUNTER — Emergency Department (HOSPITAL_COMMUNITY): Payer: Self-pay

## 2016-01-07 DIAGNOSIS — F17210 Nicotine dependence, cigarettes, uncomplicated: Secondary | ICD-10-CM | POA: Insufficient documentation

## 2016-01-07 DIAGNOSIS — I10 Essential (primary) hypertension: Secondary | ICD-10-CM | POA: Insufficient documentation

## 2016-01-07 DIAGNOSIS — J069 Acute upper respiratory infection, unspecified: Secondary | ICD-10-CM | POA: Insufficient documentation

## 2016-01-07 DIAGNOSIS — J189 Pneumonia, unspecified organism: Secondary | ICD-10-CM | POA: Insufficient documentation

## 2016-01-07 DIAGNOSIS — Z7982 Long term (current) use of aspirin: Secondary | ICD-10-CM | POA: Insufficient documentation

## 2016-01-07 DIAGNOSIS — H109 Unspecified conjunctivitis: Secondary | ICD-10-CM | POA: Insufficient documentation

## 2016-01-07 MED ORDER — ALBUTEROL SULFATE (2.5 MG/3ML) 0.083% IN NEBU: 2.5000 mg | INHALATION_SOLUTION | Freq: Four times a day (QID) | RESPIRATORY_TRACT | 12 refills | Status: DC | PRN

## 2016-01-07 MED ORDER — DOXYCYCLINE HYCLATE 100 MG PO TABS
100.0000 mg | ORAL_TABLET | Freq: Once | ORAL | Status: AC
  Administered 2016-01-07: 100 mg via ORAL
  Filled 2016-01-07: qty 1

## 2016-01-07 MED ORDER — BENZONATATE 100 MG PO CAPS
200.0000 mg | ORAL_CAPSULE | Freq: Once | ORAL | Status: AC
  Administered 2016-01-07: 200 mg via ORAL
  Filled 2016-01-07: qty 2

## 2016-01-07 MED ORDER — POLYMYXIN B-TRIMETHOPRIM 10000-0.1 UNIT/ML-% OP SOLN
1.0000 [drp] | OPHTHALMIC | Status: DC
  Administered 2016-01-07: 1 [drp] via OPHTHALMIC
  Filled 2016-01-07: qty 10

## 2016-01-07 MED ORDER — PREDNISONE 20 MG PO TABS
60.0000 mg | ORAL_TABLET | Freq: Once | ORAL | Status: AC
  Administered 2016-01-07: 60 mg via ORAL
  Filled 2016-01-07: qty 3

## 2016-01-07 MED ORDER — PREDNISONE 20 MG PO TABS: 40.0000 mg | ORAL_TABLET | Freq: Every day | ORAL | 0 refills | Status: DC

## 2016-01-07 MED ORDER — BENZONATATE 100 MG PO CAPS: 200.0000 mg | ORAL_CAPSULE | Freq: Two times a day (BID) | ORAL | 0 refills | Status: DC | PRN

## 2016-01-07 MED ORDER — GUAIFENESIN-CODEINE 100-10 MG/5ML PO SOLN: 5.0000 mL | Freq: Three times a day (TID) | ORAL | 0 refills | Status: DC | PRN

## 2016-01-07 MED ORDER — ALBUTEROL SULFATE HFA 108 (90 BASE) MCG/ACT IN AERS: 1.0000 | INHALATION_SPRAY | Freq: Four times a day (QID) | RESPIRATORY_TRACT | 0 refills | Status: DC | PRN

## 2016-01-07 MED ORDER — DOXYCYCLINE HYCLATE 100 MG PO CAPS: 100.0000 mg | ORAL_CAPSULE | Freq: Two times a day (BID) | ORAL | 0 refills | Status: DC

## 2016-01-07 MED ORDER — IPRATROPIUM-ALBUTEROL 0.5-2.5 (3) MG/3ML IN SOLN
3.0000 mL | Freq: Once | RESPIRATORY_TRACT | Status: AC
  Administered 2016-01-07: 3 mL via RESPIRATORY_TRACT
  Filled 2016-01-07: qty 3

## 2016-01-07 NOTE — ED Triage Notes (Signed)
Pt reports nasal congestion, cough, sore throat X2 weeks. She reports eye drainage that started today.

## 2016-01-07 NOTE — ED Provider Notes (Signed)
[facility] Provider Note   CSN: [id number] Arrival date & time: 01/07/16  0801     History   Chief Complaint Chief Complaint  Patient presents with  . URI  . Eye Drainage    HPI Amanda [name] is a 55 y.o. female.  HPI   Patient is a 55 year old female, current smoker with past medical history of hypertension, she presents emergency complaining of 2 weeks of URI symptoms which gradually worsened with productive cough, with yellow sputum, shortness of breath, hot and cold chills, sweats and generalized malaise. She complains of nasal congestion, sore throat and sore ribs from coughing. She has had no relief with Mucinex. This morning she developed drainage from both of her eyes with mild swelling and redness more prominent in her right eye she denies any pain or blurry vision. She denies any fevers, night sweats, exertional chest pain, shortness of breath, near syncope.  , No hemoptysis, no recent travel. She denies any other medical history.  She does feel slightly wheezy.  No other acute or associated sx.  Past Medical History:  Diagnosis Date  . Hypertension     There are no active problems to display for this patient.   Past Surgical History:  Procedure Laterality Date  . I&D EXTREMITY Right 01/21/2014   Procedure: IRRIGATION AND DEBRIDEMENT RIGHT INDEX FINGER;  Surgeon: Betha Loa, MD;  Location: [facility] OR;  Service: Orthopedics;  Laterality: Right;    OB History    No data available       Home Medications    Prior to Admission medications   Medication Sig Start Date End Date Taking? Authorizing Provider  albuterol (PROVENTIL HFA;VENTOLIN HFA) 108 (90 BASE) MCG/ACT inhaler Inhale 2 puffs into the lungs every 4 (four) hours as needed for wheezing. 07/24/13   Adrian Blackwater Baker, PA-C  amLODipine (NORVASC) 5 MG tablet Take 2 tablets (10 mg total) by mouth daily. 06/17/15   Ruben Im, MD  aspirin 325 MG tablet Take 975 mg by mouth once.    Historical Provider, MD    cephALEXin (KEFLEX) 500 MG capsule Take 1 capsule (500 mg total) by mouth 4 (four) times daily. Take all of medicine and drink lots of fluids Patient not taking: Reported on 06/17/2015 10/08/14   Linna Hoff, MD  cyclobenzaprine (FLEXERIL) 10 MG tablet Take 1 tablet (10 mg total) by mouth 3 (three) times daily as needed for muscle spasms. Patient not taking: Reported on 06/17/2015 10/11/13   Ambrose Finland, NP  hydrochlorothiazide (HYDRODIURIL) 25 MG tablet Take 0.5 tablets (12.5 mg total) by mouth daily. Patient not taking: Reported on 06/17/2015 10/11/13   Ambrose Finland, NP  HYDROcodone-homatropine [facility]) 5-1.5 MG/5ML syrup Take 5 mLs by mouth every 6 (six) hours as needed for cough. 06/17/15   Ruben Im, MD  hydrocortisone cream 1 % Apply 1 application topically daily as needed for itching (Applied to finger). Reported on 06/17/2015    Historical Provider, MD  ibuprofen (ADVIL,MOTRIN) 200 MG tablet Take 400 mg by mouth once. Reported on 06/17/2015    Historical Provider, MD  OVER THE COUNTER MEDICATION Take 1 capsule by mouth once. Reported on 06/17/2015    Historical Provider, MD  oxyCODONE-acetaminophen (PERCOCET) 5-325 MG per tablet 1-2 tabs po q6 hours prn pain Patient not taking: Reported on 06/17/2015 01/21/14   Betha Loa, MD  oxymetazoline [facility] NASAL SPRAY) 0.05 % nasal spray Place 1 spray into both nostrils 2 (two) times daily. 06/17/15   Ruben Im,  MD  Spacer/Aero-Holding Chambers (AEROCHAMBER PLUS FLO-VU LARGE) MISC 1 each by Other route once. Patient not taking: Reported on 06/17/2015 07/24/13   Graylon Good, PA-C  sulfamethoxazole-trimethoprim (BACTRIM DS) 800-160 MG per tablet Take 1 tablet by mouth 2 (two) times daily. Patient not taking: Reported on 06/17/2015 01/21/14   Betha Loa, MD    Family History History reviewed. No pertinent family history.  Social History Social History  Substance Use Topics  . Smoking status: Current Some Day Smoker    Types: Cigarettes  . Smokeless  tobacco: Never Used  . Alcohol use Yes     Comment: 3 times a week     Allergies   Review of patient's allergies indicates no known allergies.   Review of Systems Review of Systems  All other systems reviewed and are negative.    Physical Exam Updated Vital Signs BP (!) 177/106   Pulse 89   Temp 97.9 F (36.6 C) (Oral)   Resp 18   LMP 12/24/2015 (Within Days)   SpO2 97%   Physical Exam  Constitutional: She is oriented to person, place, and time. She appears well-developed and well-nourished. She is cooperative.  Non-toxic appearance. She does not have a sickly appearance. She does not appear ill. No distress.  HENT:  Head: Normocephalic and atraumatic.  Right Ear: External ear normal.  Left Ear: External ear normal.  Mouth/Throat: Oropharynx is clear and moist. No oropharyngeal exudate.  Nasal mucosa edematous and erythematous  Eyes: EOM are normal. Pupils are equal, round, and reactive to light. Right eye exhibits discharge. Left eye exhibits discharge. Right conjunctiva is injected. Right conjunctiva has no hemorrhage. Left conjunctiva is injected. Left conjunctiva has no hemorrhage.  Mild swelling of right upper eyelid Bilateral conjunctival injection with persistent clear discharge and intermittent yellow mucoid discharge Normal EOMs bilaterally   Neck: Normal range of motion. Neck supple.  Cardiovascular: Normal rate, regular rhythm, normal heart sounds and intact distal pulses.  Exam reveals no gallop and no friction rub.   No murmur heard. Pulmonary/Chest: No stridor. No respiratory distress. She has wheezes. She exhibits no tenderness and no retraction.  Patient without respiratory distress, no tachypnea, no accessory muscle use, frequent cough, diminished breath sounds bilaterally at the bases, end expiratory wheeze bilaterally the upper lung fields, scattered rhonchi  Abdominal: Soft. Bowel sounds are normal. She exhibits no distension and no mass. There is no  tenderness. There is no guarding.  Musculoskeletal: Normal range of motion. She exhibits no deformity.  Neurological: She is alert and oriented to person, place, and time. She exhibits normal muscle tone. Coordination normal.  Skin: Skin is warm and dry. Capillary refill takes less than 2 seconds. No rash noted. She is not diaphoretic. No pallor.  Psychiatric: She has a normal mood and affect. Her behavior is normal. Judgment and thought content normal.  Nursing note and vitals reviewed.    ED Treatments / Results  Labs (all labs ordered are listed, but only abnormal results are displayed) Labs Reviewed - No data to display  EKG  EKG Interpretation  Date/Time:  Tuesday January 07 2016 08:15:48 EDT Ventricular Rate:  94 PR Interval:  132 QRS Duration: 78 QT Interval:  390 QTC Calculation: 487 R Axis:   59 Text Interpretation:  Normal sinus rhythm Biatrial enlargement Septal infarct , age undetermined Abnormal ECG No significant change since last tracing Confirmed by [facility] MD, Barbara Cower [id number]) on 01/07/2016 8:15:13 AM       Radiology Dg Chest 2  View  Result Date: 01/07/2016 CLINICAL DATA:  Sore throat and shortness of breath. It medial chest pain. Productive cough. Symptoms for 2 weeks. EXAM: CHEST  2 VIEW COMPARISON:  Two-view chest x-ray 07/24/2013 FINDINGS: The heart size is normal. A diffuse interstitial and airspace pattern has increased since the prior exam. There is likely some element of underlying COPD. Airspace disease is most prominent in the left lower lobe. There are no effusions. The visualized soft tissues and bony thorax are unremarkable. IMPRESSION: 1. Increase in diffuse interstitial and airspace pattern, particularly within the left lower lobe. This is concerning for atypical pneumonia. Edema is considered less likely. Electronically Signed   By: Marin Robertshristopher  Mattern M.D.   On: 01/07/2016 08:47    Procedures Procedures (including critical care time)  Medications  Ordered in ED Medications  trimethoprim-polymyxin b (POLYTRIM) ophthalmic solution 1 drop (not administered)  ipratropium-albuterol (DUONEB) 0.5-2.5 (3) MG/3ML nebulizer solution 3 mL (3 mLs Nebulization Given 01/07/16 1055)  predniSONE (DELTASONE) tablet 60 mg (60 mg Oral Given 01/07/16 1055)  doxycycline (VIBRA-TABS) tablet 100 mg (100 mg Oral Given 01/07/16 1055)     Initial Impression / Assessment and Plan / ED Course  I have reviewed the triage vital signs and the nursing notes.  Pertinent labs & imaging results that were available during my care of the patient were reviewed by me and considered in my medical decision making (see chart for details).  Clinical Course  55 year old female, current smoker with hypertension, presents with 2 weeks of URI with shortness of breath with productive cough, new eye drainage this morning. Chest x-ray concerning for atypical pneumonia, this is consistent with exam with scattered rhonchi, frequent productive cough.  She has end expiratory wheeze on exam, appears mildly short of breath with coughing fits that at rest and with ambulation has no hypoxia or tachypnea, no increased work of breathing, no respiratory distress.  She was given breathing treatment, eyedrops, steroids and doxycycline in the ER. She will be discharged home with doxycycline, nebulizer, albuterol, cough medicine. She agrees to follow up with her PCP in 2-3 days. She understands return precautions.  Was discharged home in good condition, vital signs stable except for HTN, pt states she will take her home meds after discharge, no HA, CP, SOB.  No concern for end organ damage.    Final Clinical Impressions(s) / ED Diagnoses   Final diagnoses:  CAP (community acquired pneumonia)  Bilateral conjunctivitis  URI (upper respiratory infection)    New Prescriptions New Prescriptions   No medications on file     Danelle BerryLeisa Anzlee Hinesley, PA-C 01/29/16 0053    Vanetta MuldersScott Zackowski, MD 01/30/16  1547

## 2016-01-07 NOTE — ED Notes (Signed)
Patient transported to X-ray 

## 2016-01-07 NOTE — ED Notes (Signed)
Message sent to pharmacy requesting Polytrim.

## 2018-03-07 ENCOUNTER — Ambulatory Visit (HOSPITAL_COMMUNITY)
Admission: EM | Admit: 2018-03-07 | Discharge: 2018-03-07 | Disposition: A | Discharge status: Home / Self Care | Payer: Self-pay | Attending: Family Medicine | Admitting: Family Medicine

## 2018-03-07 ENCOUNTER — Encounter (HOSPITAL_COMMUNITY): Payer: Self-pay | Admitting: Emergency Medicine

## 2018-03-07 DIAGNOSIS — R21 Rash and other nonspecific skin eruption: Secondary | ICD-10-CM

## 2018-03-07 MED ORDER — PREDNISONE 10 MG (21) PO TBPK: ORAL_TABLET | Freq: Every day | ORAL | 0 refills | Status: DC

## 2018-03-07 NOTE — ED Triage Notes (Signed)
Pt c/o bilateral rash on arms x1 week, states its spreading to her chest. Pt BP is also high and she states shes supposed to be on medicine but does not have her orange card yet. Pt denies dizziness, headache, blurred vision.

## 2018-03-07 NOTE — ED Provider Notes (Signed)
[facility]   [id number] 03/07/18 Arrival Time: 1107  ASSESSMENT & PLAN:  1. Morbilliform rash   Suspect drug eruption secondary to taking and and unprescribed antibiotic ("think it was penicillin") approx one week ago. Has not taken any further new medications. No signs of SJS. Discussed. She will watch closely.  Elects trial of prednisone though I explained this may not provide much relief. Meds ordered this encounter  Medications  . predniSONE (STERAPRED UNI-PAK 21 TAB) 10 MG (21) TBPK tablet    Sig: Take by mouth daily. Take as directed.    Dispense:  21 tablet    Refill:  0    Follow-up Information    [facility].   Specialty:  Emergency Medicine Why:  If symptoms worsen. Contact information: [address] [id number] [place] [phone]         Reviewed expectations re: course of current medical issues. Questions answered. Outlined signs and symptoms indicating need for more acute intervention. Patient verbalized understanding. After Visit Summary given.   SUBJECTIVE:  Amanda [name] is a 57 y.o. female who presents with a skin complaint.   Location: torso and extremities; unsure where she first noticed the rash Onset: fairly quickly about one week ago; no worsening Associated pruritis? Occasional and mild Associated pain? none Progression: stable; "maybe getting a little better"  Drainage? No  Known trigger? She reports onset of rash about one week after taking and couple doses of an antibiotic given to her by her friend. Suspects it was penicillin. No previous allergy to this known. New soaps/lotions/topicals/detergents/environmental exposures? No Contacts with similar? No Recent travel? No  Other associated symptoms: none reported.  Therapies tried thus far: none Arthralgia or myalgia? none Recent illness? none Fever? none No specific aggravating or alleviating  factors reported. No n/v.  ROS: As per HPI. All other systems negative.   OBJECTIVE: Vitals:   03/07/18 1227  BP: (!) 193/110  Pulse: 78  Resp: 16  Temp: 97.8 F (36.6 C)  SpO2: 99%    General appearance: alert; no distress HEENT: oropharynx moist and without lesions; no erythema Neck: supple with FROM; no LAD Lungs: clear to auscultation bilaterally; unlabored Heart: regular rate and rhythm Abd: soft; non-tender Extremities: no edema Skin: warm and dry; scattered pink to red macules with few papules over torso and upper/lower extremities; most areas blanch with pressure; a few areas of confluence; spares palms and soles; a few excoriated areas on her upper extremities; no signs of superficial infection; no vaginal lesions reported; no blistering or ulcerations seen Psychological: alert and cooperative; normal mood and affect  No Known Allergies  Past Medical History:  Diagnosis Date  . Hypertension    Social History   Socioeconomic History  . Marital status: Single    Spouse name: Not on file  . Number of children: Not on file  . Years of education: Not on file  . Highest education level: Not on file  Occupational History  . Not on file  Social Needs  . Financial resource strain: Not on file  . Food insecurity:    Worry: Not on file    Inability: Not on file  . Transportation needs:    Medical: Not on file    Non-medical: Not on file  Tobacco Use  . Smoking status: Current Some Day Smoker    Types: Cigarettes  . Smokeless tobacco: Never Used  Substance and Sexual Activity  . Alcohol use: Yes  Comment: 3 times a week  . Drug use: No  . Sexual activity: Yes  Lifestyle  . Physical activity:    Days per week: Not on file    Minutes per session: Not on file  . Stress: Not on file  Relationships  . Social connections:    Talks on phone: Not on file    Gets together: Not on file    Attends religious service: Not on file    Active member of club or  organization: Not on file    Attends meetings of clubs or organizations: Not on file    Relationship status: Not on file  . Intimate partner violence:    Fear of current or ex partner: Not on file    Emotionally abused: Not on file    Physically abused: Not on file    Forced sexual activity: Not on file  Other Topics Concern  . Not on file  Social History Narrative  . Not on file   No family history on file. Past Surgical History:  Procedure Laterality Date  . I&D EXTREMITY Right 01/21/2014   Procedure: IRRIGATION AND DEBRIDEMENT RIGHT INDEX FINGER;  Surgeon: Betha Loa, MD;  Location: [facility] OR;  Service: Orthopedics;  Laterality: Right;     Mardella Layman, MD 03/07/18 1332

## 2021-03-22 ENCOUNTER — Emergency Department (HOSPITAL_COMMUNITY)
Admission: EM | Admit: 2021-03-22 | Discharge: 2021-03-22 | Disposition: A | Discharge status: Home / Self Care | Payer: Self-pay | Attending: Emergency Medicine | Admitting: Emergency Medicine

## 2021-03-22 ENCOUNTER — Other Ambulatory Visit: Payer: Self-pay

## 2021-03-22 ENCOUNTER — Encounter (HOSPITAL_COMMUNITY): Payer: Self-pay | Admitting: Emergency Medicine

## 2021-03-22 DIAGNOSIS — W57XXXA Bitten or stung by nonvenomous insect and other nonvenomous arthropods, initial encounter: Secondary | ICD-10-CM | POA: Insufficient documentation

## 2021-03-22 DIAGNOSIS — L235 Allergic contact dermatitis due to other chemical products: Secondary | ICD-10-CM | POA: Insufficient documentation

## 2021-03-22 DIAGNOSIS — I10 Essential (primary) hypertension: Secondary | ICD-10-CM | POA: Insufficient documentation

## 2021-03-22 DIAGNOSIS — L253 Unspecified contact dermatitis due to other chemical products: Secondary | ICD-10-CM

## 2021-03-22 DIAGNOSIS — F17210 Nicotine dependence, cigarettes, uncomplicated: Secondary | ICD-10-CM | POA: Insufficient documentation

## 2021-03-22 LAB — CBC WITH DIFFERENTIAL/PLATELET
Abs Immature Granulocytes: 0.01 10*3/uL (ref 0.00–0.07)
Basophils Absolute: 0 10*3/uL (ref 0.0–0.1)
Basophils Relative: 0 %
Eosinophils Absolute: 0.5 10*3/uL (ref 0.0–0.5)
Eosinophils Relative: 6 %
HCT: 45.6 % (ref 36.0–46.0)
Hemoglobin: 14.8 g/dL (ref 12.0–15.0)
Immature Granulocytes: 0 %
Lymphocytes Relative: 31 %
Lymphs Abs: 2.3 10*3/uL (ref 0.7–4.0)
MCH: 30.5 pg (ref 26.0–34.0)
MCHC: 32.5 g/dL (ref 30.0–36.0)
MCV: 93.8 fL (ref 80.0–100.0)
Monocytes Absolute: 0.5 10*3/uL (ref 0.1–1.0)
Monocytes Relative: 6 %
Neutro Abs: 4.2 10*3/uL (ref 1.7–7.7)
Neutrophils Relative %: 57 %
Platelets: 246 10*3/uL (ref 150–400)
RBC: 4.86 MIL/uL (ref 3.87–5.11)
RDW: 14.4 % (ref 11.5–15.5)
WBC: 7.4 10*3/uL (ref 4.0–10.5)
nRBC: 0 % (ref 0.0–0.2)

## 2021-03-22 LAB — COMPREHENSIVE METABOLIC PANEL
ALT: 14 U/L (ref 0–44)
AST: 23 U/L (ref 15–41)
Albumin: 3.4 g/dL — ABNORMAL LOW (ref 3.5–5.0)
Alkaline Phosphatase: 70 U/L (ref 38–126)
Anion gap: 7 (ref 5–15)
BUN: 14 mg/dL (ref 6–20)
CO2: 27 mmol/L (ref 22–32)
Calcium: 9.5 mg/dL (ref 8.9–10.3)
Chloride: 107 mmol/L (ref 98–111)
Creatinine, Ser: 1 mg/dL (ref 0.44–1.00)
GFR, Estimated: >60 mL/min (ref >60)
Glucose, Bld: 131 mg/dL — ABNORMAL HIGH (ref 70–99)
Potassium: 4.4 mmol/L (ref 3.5–5.1)
Sodium: 141 mmol/L (ref 135–145)
Total Bilirubin: 0.6 mg/dL (ref 0.3–1.2)
Total Protein: 7 g/dL (ref 6.5–8.1)

## 2021-03-22 MED ORDER — CEPHALEXIN 500 MG PO CAPS: 1000.0000 mg | ORAL_CAPSULE | Freq: Two times a day (BID) | ORAL | 0 refills | Status: AC

## 2021-03-22 NOTE — ED Provider Notes (Signed)
Emergency Medicine Provider Triage Evaluation Note  Amanda [name] , a 60 y.o. female  was evaluated in triage.  Pt complains of possible spider bite to the posterior right thigh.  She states this happened 1 week ago.  She states she did not see an insect or spider bite her and assumes that this happened while she was sleeping.  She states that she woke up with a red welt on the back of her leg.  She states that this is then expanded and her skin has come off.  She did not note any eschar prior to skin coming off.  She has noted bleeding and pus coming from the area.  She denies fevers, abdominal pain or nausea.  She has noted headaches over the past week.  Review of Systems  Positive: See above Negative:   Physical Exam  BP (!) 196/105 (BP Location: Right Arm)   Pulse 86   Temp 98.3 F (36.8 C) (Oral)   Resp 16   Ht 5\' 4"  (1.626 m)   Wt 61.2 kg   LMP 12/24/2015 (Within Days)   SpO2 96%   BMI 23.17 kg/m  Gen:   Awake, no distress  Resp:  Normal effort  MSK:   Moves extremities without difficulty  Other:  No focal neurological deficits.  The posterior right thigh has a 4 cm x 4 cm wound.  The epidermis is removed.  There appears to be granulation tissue, areas of bleeding, focal area of induration.  Tenderness to palpation.  There is no surrounding redness or cellulitis.  Medical Decision Making  Medically screening exam initiated at 11:00 AM.  Appropriate orders placed.  Amanda [name] was informed that the remainder of the evaluation will be completed by another provider, this initial triage assessment does not replace that evaluation, and the importance of remaining in the ED until their evaluation is complete.     Windle Guard, PA-C 03/22/21 1103    14/10/22, DO 03/22/21 [phone]

## 2021-03-22 NOTE — ED Provider Notes (Signed)
[facility] EMERGENCY DEPARTMENT Provider Note   CSN: [id number] Arrival date & time: 03/22/21  7062     History Chief Complaint  Patient presents with   Insect Bite    Amanda [name] is a 60 y.o. female.  60 yo F with a chief complaints of a wound to the posterior aspect of the right leg.  This is been going on for about a week now.  She tells me she had a little spot there and she started applying bleach to it under bandage and is gotten progressively worse.  She tells me that there was some purulent drainage from the area.  She denies fevers or chills.  Denies other trauma.  The history is provided by the patient.  Illness Severity:  Moderate Onset quality:  Gradual Duration:  1 week Timing:  Constant Progression:  Worsening Chronicity:  New Associated symptoms: no chest pain, no congestion, no fever, no headaches, no myalgias, no nausea, no rhinorrhea, no shortness of breath, no vomiting and no wheezing       Past Medical History:  Diagnosis Date   Hypertension     There are no problems to display for this patient.   Past Surgical History:  Procedure Laterality Date   I & D EXTREMITY Right 01/21/2014   Procedure: IRRIGATION AND DEBRIDEMENT RIGHT INDEX FINGER;  Surgeon: Betha Loa, MD;  Location: [facility] OR;  Service: Orthopedics;  Laterality: Right;     OB History   No obstetric history on file.     No family history on file.  Social History   Tobacco Use   Smoking status: Some Days    Types: Cigarettes   Smokeless tobacco: Never  Substance Use Topics   Alcohol use: Yes    Comment: 3 times a week   Drug use: No    Home Medications Prior to Admission medications   Medication Sig Start Date End Date Taking? Authorizing Provider  cephALEXin (KEFLEX) 500 MG capsule Take 2 capsules (1,000 mg total) by mouth 2 (two) times daily for 7 days. 03/22/21 03/29/21 Yes Melene Plan, DO  albuterol (PROVENTIL HFA;VENTOLIN HFA) 108 (90 BASE) MCG/ACT  inhaler Inhale 2 puffs into the lungs every 4 (four) hours as needed for wheezing. 07/24/13   Baker, Adrian Black[place], PA-C  albuterol (PROVENTIL HFA;VENTOLIN HFA) 108 (90 Base) MCG/ACT inhaler Inhale 1-2 puffs into the lungs every 6 (six) hours as needed for wheezing or shortness of breath. 01/07/16   Danelle Berry, PA-C  albuterol (PROVENTIL) (2.5 MG/3ML) 0.083% nebulizer solution Take 3 mLs (2.5 mg total) by nebulization every 6 (six) hours as needed for wheezing or shortness of breath. 01/07/16   Danelle Berry, PA-C  amLODipine (NORVASC) 5 MG tablet Take 2 tablets (10 mg total) by mouth daily. Patient not taking: Reported on 03/07/2018 06/17/15   Ruben Im, MD  aspirin 325 MG tablet Take 975 mg by mouth once.    [provider]  benzonatate (TESSALON) 100 MG capsule Take 2 capsules (200 mg total) by mouth 2 (two) times daily as needed for cough. Patient not taking: Reported on 03/07/2018 01/07/16   Danelle Berry, PA-C  cyclobenzaprine (FLEXERIL) 10 MG tablet Take 1 tablet (10 mg total) by mouth 3 (three) times daily as needed for muscle spasms. Patient not taking: Reported on 06/17/2015 10/11/13   Ambrose Finland, NP  doxycycline (VIBRAMYCIN) 100 MG capsule Take 1 capsule (100 mg total) by mouth 2 (two) times daily. Patient not taking: Reported on 03/07/2018 01/07/16  Delsa Grana, PA-C  guaiFENesin-codeine 100-10 MG/5ML syrup Take 5 mLs by mouth 3 (three) times daily as needed for cough. Patient not taking: Reported on 03/07/2018 01/07/16   Delsa Grana, PA-C  hydrochlorothiazide (HYDRODIURIL) 25 MG tablet Take 0.5 tablets (12.5 mg total) by mouth daily. Patient not taking: Reported on 06/17/2015 10/11/13   Lance Bosch, NP  HYDROcodone-homatropine [facility]) 5-1.5 MG/5ML syrup Take 5 mLs by mouth every 6 (six) hours as needed for cough. Patient not taking: Reported on 03/07/2018 06/17/15   Liberty Handy, MD  hydrocortisone cream 1 % Apply 1 application topically daily as needed for itching (Applied to  finger). Reported on 06/17/2015    [provider]  ibuprofen (ADVIL,MOTRIN) 200 MG tablet Take 400 mg by mouth once. Reported on 06/17/2015    [provider]  OVER THE COUNTER MEDICATION Take 1 capsule by mouth once. Reported on 06/17/2015    [provider]  oxyCODONE-acetaminophen (PERCOCET) 5-325 MG per tablet 1-2 tabs po q6 hours prn pain Patient not taking: Reported on 06/17/2015 01/21/14   Leanora Cover, MD  oxymetazoline (AFRIN NASAL SPRAY) 0.05 % nasal spray Place 1 spray into both nostrils 2 (two) times daily. 06/17/15   Liberty Handy, MD  predniSONE (STERAPRED UNI-PAK 21 TAB) 10 MG (21) TBPK tablet Take by mouth daily. Take as directed. 03/07/18   Vanessa Kick, MD  Spacer/Aero-Holding Chambers (AEROCHAMBER PLUS FLO-VU LARGE) MISC 1 each by Other route once. Patient not taking: Reported on 06/17/2015 07/24/13   Liam Graham, PA-C  sulfamethoxazole-trimethoprim (BACTRIM DS) 800-160 MG per tablet Take 1 tablet by mouth 2 (two) times daily. Patient not taking: Reported on 06/17/2015 01/21/14   Leanora Cover, MD    Allergies    Patient has no known allergies.  Review of Systems   Review of Systems  Constitutional:  Negative for chills and fever.  HENT:  Negative for congestion and rhinorrhea.   Eyes:  Negative for redness and visual disturbance.  Respiratory:  Negative for shortness of breath and wheezing.   Cardiovascular:  Negative for chest pain and palpitations.  Gastrointestinal:  Negative for nausea and vomiting.  Genitourinary:  Negative for dysuria and urgency.  Musculoskeletal:  Negative for arthralgias and myalgias.  Skin:  Positive for wound. Negative for pallor.  Neurological:  Negative for dizziness and headaches.   Physical Exam Updated Vital Signs BP (!) 196/105 (BP Location: Right Arm)   Pulse 86   Temp 98.3 F (36.8 C) (Oral)   Resp 16   Ht 5\' 4"  (1.626 m)   Wt 61.2 kg   LMP 12/24/2015 (Within Days)   SpO2 96%   BMI 23.17 kg/m    Physical Exam Vitals and nursing note reviewed.  Constitutional:      General: She is not in acute distress.    Appearance: She is well-developed. She is not diaphoretic.  HENT:     Head: Normocephalic and atraumatic.  Eyes:     Pupils: Pupils are equal, round, and reactive to light.  Cardiovascular:     Rate and Rhythm: Normal rate and regular rhythm.     Heart sounds: No murmur heard.   No friction rub. No gallop.  Pulmonary:     Effort: Pulmonary effort is normal.     Breath sounds: No wheezing or rales.  Abdominal:     General: There is no distension.     Palpations: Abdomen is soft.     Tenderness: There is no abdominal tenderness.  Musculoskeletal:  General: No tenderness.     Cervical back: Normal range of motion and neck supple.     Comments: Erosion of the skin to the posterior aspect of the right leg.  This area is perfectly straight on the edges.  There is no fluctuance no induration.  Some granulation.  Very mild oozing of blood.  Left ring finger the patient is unable to extend.  Skin:    General: Skin is warm and dry.  Neurological:     Mental Status: She is alert and oriented to person, place, and time.  Psychiatric:        Behavior: Behavior normal.    ED Results / Procedures / Treatments   Labs (all labs ordered are listed, but only abnormal results are displayed) Labs Reviewed  COMPREHENSIVE METABOLIC PANEL - Abnormal; Notable for the following components:      Result Value   Glucose, Bld 131 (*)    Albumin 3.4 (*)    All other components within normal limits  CBC WITH DIFFERENTIAL/PLATELET    EKG None  Radiology No results found.  Procedures Procedures   Medications Ordered in ED Medications - No data to display  ED Course  I have reviewed the triage vital signs and the nursing notes.  Pertinent labs & imaging results that were available during my care of the patient were reviewed by me and considered in my medical decision  making (see chart for details).    MDM Rules/Calculators/A&P                           60 yo F with a chief complaints of a wound to the posterior aspect of the right leg.  Clinically is very well delineated which makes me feel like it was either intentionally cut with straight edges or she had a very focal area of chemical dermatitis.  The patient does endorse putting bleach in her bandage findings today feel like she likely caused local skin necrosis and loss of the superficial aspect of the skin.  There is no abscess on my exam.  I discussed good wound care with patient.  I am worried about the area and the patient may need a skin graft.  We will refer her to plastic surgery at [facility]  Referral made for wound care clinic here.  She is also complaining of left finger injury that is been ongoing for many weeks.  I have given her information for the hand surgeon on-call.  2:26 PM:  I have discussed the diagnosis/risks/treatment options with the patient and family and believe the pt to be eligible for discharge home to follow-up with PCP. We also discussed returning to the ED immediately if new or worsening sx occur. We discussed the sx which are most concerning (e.g., sudden worsening pain, fever, inability to tolerate by mouth) that necessitate immediate return. Medications administered to the patient during their visit and any new prescriptions provided to the patient are listed below.  Medications given during this visit Medications - No data to display   The patient appears reasonably screen and/or stabilized for discharge and I doubt any other medical condition or other [facility] requiring further screening, evaluation, or treatment in the ED at this time prior to discharge.   Final Clinical Impression(s) / ED Diagnoses Final diagnoses:  Chemical dermatitis    Rx / DC Orders ED Discharge Orders          Ordered  Ambulatory referral to Wound Clinic  Status:  Canceled        03/22/21  1417    Ambulatory referral to Wound Clinic        03/22/21 1419    cephALEXin (KEFLEX) 500 MG capsule  2 times daily        03/22/21 1420             Deno Etienne, [place] 03/22/21 1426

## 2021-03-22 NOTE — Discharge Instructions (Addendum)
Apply an ointment such as Vaseline or Aquaphor on this twice a day.  Clean with soap and water.  Do not apply any bleach use or harsh soaps to the area.  Please return for rapid spreading redness or fever.  Unfortunately there is no Engineer, petroleum on-call for [place] today.  The closest referral center would be [facility] in [place].  Please call the number provided to try and set up an appointment to be seen in the office.  I have tried to place a referral for the wound clinic.  But it seems that there has been some delay to get people into see you acutely.  Call the hand surgery office to set up an appointment.   Your blood pressure is up.  Please see your family doctor for this.

## 2021-03-22 NOTE — ED Triage Notes (Signed)
Pt reports spider bite to posterior R upper leg x 1 week.  Denies fever.  Reports chills.

## 2021-09-08 ENCOUNTER — Inpatient Hospital Stay (HOSPITAL_COMMUNITY)
Admission: EM | Admit: 2021-09-08 | Discharge: 2021-09-10 | DRG: 291 | Disposition: A | Discharge status: Home / Self Care | Payer: Self-pay | Attending: Family Medicine | Admitting: Family Medicine

## 2021-09-08 ENCOUNTER — Emergency Department (HOSPITAL_COMMUNITY): Payer: Self-pay

## 2021-09-08 ENCOUNTER — Encounter (HOSPITAL_COMMUNITY): Payer: Self-pay

## 2021-09-08 ENCOUNTER — Inpatient Hospital Stay (HOSPITAL_COMMUNITY): Payer: Self-pay

## 2021-09-08 ENCOUNTER — Other Ambulatory Visit: Payer: Self-pay

## 2021-09-08 DIAGNOSIS — R7989 Other specified abnormal findings of blood chemistry: Secondary | ICD-10-CM | POA: Diagnosis present

## 2021-09-08 DIAGNOSIS — I110 Hypertensive heart disease with heart failure: Principal | ICD-10-CM | POA: Diagnosis present

## 2021-09-08 DIAGNOSIS — R778 Other specified abnormalities of plasma proteins: Secondary | ICD-10-CM | POA: Diagnosis present

## 2021-09-08 DIAGNOSIS — J811 Chronic pulmonary edema: Secondary | ICD-10-CM | POA: Diagnosis present

## 2021-09-08 DIAGNOSIS — R0603 Acute respiratory distress: Secondary | ICD-10-CM

## 2021-09-08 DIAGNOSIS — I160 Hypertensive urgency: Principal | ICD-10-CM

## 2021-09-08 DIAGNOSIS — R0602 Shortness of breath: Secondary | ICD-10-CM

## 2021-09-08 DIAGNOSIS — I161 Hypertensive emergency: Secondary | ICD-10-CM | POA: Diagnosis present

## 2021-09-08 DIAGNOSIS — I248 Other forms of acute ischemic heart disease: Secondary | ICD-10-CM | POA: Diagnosis present

## 2021-09-08 DIAGNOSIS — F17210 Nicotine dependence, cigarettes, uncomplicated: Secondary | ICD-10-CM | POA: Diagnosis present

## 2021-09-08 DIAGNOSIS — Z720 Tobacco use: Secondary | ICD-10-CM | POA: Diagnosis present

## 2021-09-08 DIAGNOSIS — J9601 Acute respiratory failure with hypoxia: Secondary | ICD-10-CM | POA: Diagnosis present

## 2021-09-08 DIAGNOSIS — F1011 Alcohol abuse, in remission: Secondary | ICD-10-CM | POA: Diagnosis present

## 2021-09-08 DIAGNOSIS — J810 Acute pulmonary edema: Secondary | ICD-10-CM | POA: Diagnosis present

## 2021-09-08 DIAGNOSIS — F1010 Alcohol abuse, uncomplicated: Secondary | ICD-10-CM | POA: Diagnosis present

## 2021-09-08 DIAGNOSIS — Z20822 Contact with and (suspected) exposure to covid-19: Secondary | ICD-10-CM | POA: Diagnosis present

## 2021-09-08 DIAGNOSIS — F1410 Cocaine abuse, uncomplicated: Secondary | ICD-10-CM | POA: Diagnosis present

## 2021-09-08 DIAGNOSIS — I5033 Acute on chronic diastolic (congestive) heart failure: Secondary | ICD-10-CM | POA: Diagnosis present

## 2021-09-08 DIAGNOSIS — R9389 Abnormal findings on diagnostic imaging of other specified body structures: Secondary | ICD-10-CM | POA: Diagnosis present

## 2021-09-08 HISTORY — DX: Acute respiratory distress: R06.03

## 2021-09-08 HISTORY — DX: Other specified abnormal findings of blood chemistry: R79.89

## 2021-09-08 HISTORY — DX: Chronic pulmonary edema: J81.1

## 2021-09-08 LAB — I-STAT VENOUS BLOOD GAS, ED
Acid-Base Excess: 2 mmol/L (ref 0.0–2.0)
Bicarbonate: 24.7 mmol/L (ref 20.0–28.0)
Calcium, Ion: 1.05 mmol/L — ABNORMAL LOW (ref 1.15–1.40)
HCT: 47 % — ABNORMAL HIGH (ref 36.0–46.0)
Hemoglobin: 16 g/dL — ABNORMAL HIGH (ref 12.0–15.0)
O2 Saturation: 99 %
Potassium: 4.1 mmol/L (ref 3.5–5.1)
Sodium: 138 mmol/L (ref 135–145)
TCO2: 26 mmol/L (ref 22–32)
pCO2, Ven: 31.2 mmHg — ABNORMAL LOW (ref 44–60)
pH, Ven: 7.508 — ABNORMAL HIGH (ref 7.25–7.43)
pO2, Ven: 145 mmHg — ABNORMAL HIGH (ref 32–45)

## 2021-09-08 LAB — TROPONIN I (HIGH SENSITIVITY): Troponin I (High Sensitivity): 95 ng/L — ABNORMAL HIGH (ref <18)

## 2021-09-08 LAB — COMPREHENSIVE METABOLIC PANEL
ALT: 118 U/L — ABNORMAL HIGH (ref 0–44)
AST: 83 U/L — ABNORMAL HIGH (ref 15–41)
Albumin: 3.2 g/dL — ABNORMAL LOW (ref 3.5–5.0)
Alkaline Phosphatase: 252 U/L — ABNORMAL HIGH (ref 38–126)
Anion gap: 10 (ref 5–15)
BUN: 19 mg/dL (ref 6–20)
CO2: 23 mmol/L (ref 22–32)
Calcium: 9 mg/dL (ref 8.9–10.3)
Chloride: 106 mmol/L (ref 98–111)
Creatinine, Ser: 1.14 mg/dL — ABNORMAL HIGH (ref 0.44–1.00)
GFR, Estimated: 55 mL/min — ABNORMAL LOW (ref >60)
Glucose, Bld: 127 mg/dL — ABNORMAL HIGH (ref 70–99)
Potassium: 4.3 mmol/L (ref 3.5–5.1)
Sodium: 139 mmol/L (ref 135–145)
Total Bilirubin: 0.7 mg/dL (ref 0.3–1.2)
Total Protein: 6.9 g/dL (ref 6.5–8.1)

## 2021-09-08 LAB — CBC WITH DIFFERENTIAL/PLATELET
Abs Immature Granulocytes: 0.02 10*3/uL (ref 0.00–0.07)
Basophils Absolute: 0 10*3/uL (ref 0.0–0.1)
Basophils Relative: 0 %
Eosinophils Absolute: 0 10*3/uL (ref 0.0–0.5)
Eosinophils Relative: 0 %
HCT: 45.7 % (ref 36.0–46.0)
Hemoglobin: 14.8 g/dL (ref 12.0–15.0)
Immature Granulocytes: 0 %
Lymphocytes Relative: 26 %
Lymphs Abs: 1.8 10*3/uL (ref 0.7–4.0)
MCH: 29.3 pg (ref 26.0–34.0)
MCHC: 32.4 g/dL (ref 30.0–36.0)
MCV: 90.5 fL (ref 80.0–100.0)
Monocytes Absolute: 0.6 10*3/uL (ref 0.1–1.0)
Monocytes Relative: 9 %
Neutro Abs: 4.5 10*3/uL (ref 1.7–7.7)
Neutrophils Relative %: 65 %
Platelets: 320 10*3/uL (ref 150–400)
RBC: 5.05 MIL/uL (ref 3.87–5.11)
RDW: 15 % (ref 11.5–15.5)
WBC: 7 10*3/uL (ref 4.0–10.5)
nRBC: 0 % (ref 0.0–0.2)

## 2021-09-08 LAB — BRAIN NATRIURETIC PEPTIDE: B Natriuretic Peptide: 1230.1 pg/mL — ABNORMAL HIGH (ref 0.0–100.0)

## 2021-09-08 MED ORDER — NICOTINE 21 MG/24HR TD PT24
21.0000 mg | MEDICATED_PATCH | Freq: Every day | TRANSDERMAL | Status: DC
  Administered 2021-09-09: 21 mg via TRANSDERMAL
  Filled 2021-09-08 (×2): qty 1

## 2021-09-08 MED ORDER — FUROSEMIDE 10 MG/ML IJ SOLN
20.0000 mg | Freq: Once | INTRAMUSCULAR | Status: AC
  Administered 2021-09-08: 20 mg via INTRAVENOUS
  Filled 2021-09-08: qty 2

## 2021-09-08 MED ORDER — NITROGLYCERIN IN D5W 200-5 MCG/ML-% IV SOLN
0.0000 ug/min | INTRAVENOUS | Status: DC
  Administered 2021-09-08: 5 ug/min via INTRAVENOUS
  Administered 2021-09-09: 180 ug/min via INTRAVENOUS
  Administered 2021-09-09: 100 ug/min via INTRAVENOUS
  Administered 2021-09-10: 95 ug/min via INTRAVENOUS
  Administered 2021-09-10: 90 ug/min via INTRAVENOUS
  Administered 2021-09-10: 75 ug/min via INTRAVENOUS
  Administered 2021-09-10: 70 ug/min via INTRAVENOUS
  Administered 2021-09-10: 80 ug/min via INTRAVENOUS
  Administered 2021-09-10: 75 ug/min via INTRAVENOUS
  Administered 2021-09-10: 85 ug/min via INTRAVENOUS
  Filled 2021-09-08 (×6): qty 250

## 2021-09-08 MED ORDER — NITROGLYCERIN 0.4 MG SL SUBL
0.4000 mg | SUBLINGUAL_TABLET | SUBLINGUAL | Status: DC | PRN
Start: 2021-09-08 — End: 2021-09-10
  Administered 2021-09-08 (×2): 0.4 mg via SUBLINGUAL
  Filled 2021-09-08 (×2): qty 1

## 2021-09-08 NOTE — Assessment & Plan Note (Signed)
Patient noted to have increased respirations although not hypoxic.  For increased work of breathing and severe hypertension was put on BiPAP now with some improvement.  Continue BiPAP for now

## 2021-09-08 NOTE — ED Provider Triage Note (Signed)
Emergency Medicine Provider Triage Evaluation Note  ARAYLA KRUSCHKE , a 61 y.o. female  was evaluated in triage.  Pt complains of SOB for the past three weeks that has been graudally worsening. She reports occasional chest pain. Reports a cough for 3 weeks as well. Denies any fever. Smokes 1pack/day. Has not been on any blood pressure medications in years due to medication costs. Denies any headache.  Review of Systems  Positive:  Negative:   Physical Exam  BP (S) (!) 221/151 (BP Location: Right Arm) Comment: pt reports she hasn't had her BP meds for several years  Pulse (!) 102   Temp 97.8 F (36.6 C) (Oral)   Resp (!) 30   Ht 5\' 4"  (1.626 m)   Wt 61.2 kg   LMP 12/24/2015 (Within Days)   SpO2 97%   BMI 23.17 kg/m  Gen:   Awake, no distress   Resp:  Diminished breath sounds  MSK:   Moves extremities without difficulty  Other:   Medical Decision Making  Medically screening exam initiated at 5:42 PM.  Appropriate orders placed.  ATZIRI ZUBIATE was informed that the remainder of the evaluation will be completed by another provider, this initial triage assessment does not replace that evaluation, and the importance of remaining in the ED until their evaluation is complete.  Diminished breath sounds. Patient is extremely HTN and not had medications in years.    Windle Guard, PA-C 09/08/21 2225

## 2021-09-08 NOTE — Assessment & Plan Note (Signed)
Chest x-ray consistent with fluid overload but also cannot rule out interstitial lung disease.  Once patient is diuresed and volume down would benefit from repeat imaging to further evaluate any underlying lung disease especially in the case of cocaine abuse as this can induce lung damage as well

## 2021-09-08 NOTE — ED Notes (Signed)
Per Dr Dalene Seltzer, rate on Nitroglycerin drip increased to 73mcg/min

## 2021-09-08 NOTE — Assessment & Plan Note (Signed)
Continue nitro drip and cycle cardiac enzymes Echo in the morning If abnormal will likely need cardiology follow-up. Check TSH check urine for presence of proteinuria

## 2021-09-08 NOTE — Subjective & Objective (Signed)
SOB for the past 3 wks Cannot sleep flat tachypneic shallow breathing have not been taking her blood pressure medications because of too expensive she does get occasional chest pain has been also having cough for past 3 weeks but no fever she continues to smoke about a pack a day no headache Initial blood pressure 221/151 Cannot walk in a short distance secondary to getting severe shortness of breath clear sputum when coughs no abdominal pain no nausea no vomiting no diarrhea no leg swelling

## 2021-09-08 NOTE — ED Notes (Signed)
Pt transported to xray at this time

## 2021-09-08 NOTE — Assessment & Plan Note (Signed)
States currently in remission.  Monitor for any sign of withdrawal

## 2021-09-08 NOTE — Assessment & Plan Note (Signed)
In the setting of hypertensive emergency.  Obtain echogram continue cycle cardiac enzymes. If echo abnormal will probably benefit from cardiology consult

## 2021-09-08 NOTE — H&P (Signed)
Amanda [name] ZSW:[id number] DOB: [birth year] DOA: 09/08/2021     PCP: Oneita Hurt, No   Outpatient Specialists:   NONE    Patient arrived to ER on 09/08/21 at 1700 Referred by Attending Alvira Monday, MD   Patient coming from:    home Lives With family    Chief Complaint:   Chief Complaint  Patient presents with   Shortness of Breath    HPI: Amanda [name] is a 61 y.o. female with medical history significant of HTN    Presented with  shortness of breath SOB for the past 3 wks Cannot sleep flat tachypneic shallow breathing have not been taking her blood pressure medications because of too expensive she does get occasional chest pain has been also having cough for past 3 weeks but no fever she continues to smoke about a pack a day no headache Initial blood pressure 221/151 Cannot walk in a short distance secondary to getting severe shortness of breath clear sputum when coughs no abdominal pain no nausea no vomiting no diarrhea no leg swelling   Patient reports that she continues to smoke.  Although she is ready to quit smoking.  Reports she does occasional cocaine last time was last week she still smokes marijuana. States she used to drink heavily but quit about 2 weeks ago denied any shaking with that. Denies any chest pain   in house  PCR testing  Pending  No results found for: SARSCOV2NAA   Regarding pertinent Chronic problems:    HTN not compliant with home medications   While in ER:    Started on nitro drip and bipap and has been doing better  Was given a dose of lasix    CXR - There is mild-to-moderate interstitial thickening greatest within the lung bases. This is increased from 01/07/2016, which was already increased from normal radiographs 07/24/2013. This may represent progressive mild-to-moderate interstitial pulmonary edema. There also appear to be chronic lucent emphysematous changes and interstitial thickening could represent progressive  interstitial lung disease. 2. Probable trace right and possible trace left pleural effusions. KUB ordered  Following Medications were ordered in ER: Medications  nitroGLYCERIN (NITROSTAT) SL tablet 0.4 mg (0.4 mg Sublingual Given 09/08/21 1931)  nitroGLYCERIN 50 mg in dextrose 5 % 250 mL (0.2 mg/mL) infusion (150 mcg/min Intravenous Rate/Dose Change 09/08/21 2238)  furosemide (LASIX) injection 20 mg (20 mg Intravenous Given 09/08/21 2010)     ED Triage Vitals  Enc Vitals Group     BP 09/08/21 1732 (S) (!) 221/151     Pulse Rate 09/08/21 1732 (!) 102     Resp 09/08/21 1732 (!) 30     Temp 09/08/21 1732 97.8 F (36.6 C)     Temp Source 09/08/21 1732 Oral     SpO2 09/08/21 1732 97 %     Weight 09/08/21 1729 135 lb (61.2 kg)     Height 09/08/21 1729 5\' 4"  (1.626 m)     Head Circumference --      Peak Flow --      Pain Score 09/08/21 1729 0     Pain Loc --      Pain Edu? --      Excl. in GC? --   TMAX(24)@     _________________________________________ Significant initial  Findings: Abnormal Labs Reviewed  COMPREHENSIVE METABOLIC PANEL - Abnormal; Notable for the following components:      Result Value   Glucose, Bld 127 (*)    Creatinine, Ser 1.14 (*)  Albumin 3.2 (*)    AST 83 (*)    ALT 118 (*)    Alkaline Phosphatase 252 (*)    GFR, Estimated 55 (*)    All other components within normal limits  BRAIN NATRIURETIC PEPTIDE - Abnormal; Notable for the following components:   B Natriuretic Peptide 1,230.1 (*)    All other components within normal limits  I-STAT VENOUS BLOOD GAS, ED - Abnormal; Notable for the following components:   pH, Ven 7.508 (*)    pCO2, Ven 31.2 (*)    pO2, Ven 145 (*)    Calcium, Ion 1.05 (*)    HCT 47.0 (*)    Hemoglobin 16.0 (*)    All other components within normal limits  TROPONIN I (HIGH SENSITIVITY) - Abnormal; Notable for the following components:   Troponin I (High Sensitivity) 95 (*)    All other components within normal limits      _________________________ Troponin 95  ECG: Ordered Personally reviewed by me showing: HR : 103 Rhythm:Sinus tachycardia Biatrial enlargement Nonspecific ST and T wave abnormality Abnormal ECG When compared with ECG of 07-Jan-2016 08:15, No significant change since last tracing QTC 487   The recent clinical data is shown below. Vitals:   09/08/21 2215 09/08/21 2230 09/08/21 2235 09/08/21 2240  BP: (!) 222/168 (!) 214/140 (!) 197/134 (!) 198/135  Pulse: 96 95 96 92  Resp: (!) 27 (!) 30 (!) 30 (!) 25  Temp:      TempSrc:      SpO2: 100% 100% 100% 100%  Weight:      Height:        WBC     Component Value Date/Time   WBC 7.0 09/08/2021 2133   LYMPHSABS 1.8 09/08/2021 2133   MONOABS 0.6 09/08/2021 2133   EOSABS 0.0 09/08/2021 2133   BASOSABS 0.0 09/08/2021 2133     UA  ordered    Results for orders placed or performed during the hospital encounter of 01/21/14  Anaerobic culture     Status: None   Collection Time: 01/21/14  8:51 PM  Result Value Ref Range Status   Specimen Description ABSCESS  Final   Special Requests RIGHT INDEX FINGER  Final   Gram Stain   Final    FEW WBC PRESENT,BOTH PMN AND MONONUCLEAR NO SQUAMOUS EPITHELIAL CELLS SEEN FEW GRAM POSITIVE COCCI IN PAIRS Performed at [facility]   Culture   Final    NO ANAEROBES ISOLATED Performed at [facility]   Report Status 01/26/2014 FINAL  Final  Culture, routine-abscess     Status: None   Collection Time: 01/21/14  8:51 PM  Result Value Ref Range Status   Specimen Description ABSCESS  Final   Special Requests RIGHT INDEX FINGER  Final   Gram Stain   Final    FEW WBC PRESENT,BOTH PMN AND MONONUCLEAR NO SQUAMOUS EPITHELIAL CELLS SEEN FEW GRAM POSITIVE COCCI IN PAIRS Performed at [facility]   Culture   Final    ABUNDANT STAPHYLOCOCCUS AUREUS Note: RIFAMPIN AND GENTAMICIN SHOULD NOT BE USED AS SINGLE DRUGS FOR TREATMENT OF STAPH INFECTIONS. Performed at [facility]   Report Status 01/24/2014 FINAL  Final   Organism ID, Bacteria STAPHYLOCOCCUS AUREUS  Final      Susceptibility   Staphylococcus aureus - MIC*    CLINDAMYCIN <=0.25 SENSITIVE Sensitive     ERYTHROMYCIN 4 INTERMEDIATE Intermediate     GENTAMICIN <=0.5 SENSITIVE Sensitive     LEVOFLOXACIN 0.25 SENSITIVE  Sensitive     OXACILLIN 1 SENSITIVE Sensitive     PENICILLIN >=0.5 RESISTANT Resistant     RIFAMPIN <=0.5 SENSITIVE Sensitive     TRIMETH/SULFA <=10 SENSITIVE Sensitive     VANCOMYCIN 1 SENSITIVE Sensitive     TETRACYCLINE <=1 SENSITIVE Sensitive     MOXIFLOXACIN <=0.25 SENSITIVE Sensitive     * ABUNDANT STAPHYLOCOCCUS AUREUS     _______________________________________________ Hospitalist was called for admission for  hypertensive emergency   The following Work up has been ordered so far:  Orders Placed This Encounter  Procedures   DG Chest 2 View   CBC with Differential   Comprehensive metabolic panel   Brain natriuretic peptide   Cardiac monitoring   If O2 Sat <94% administer O2 at 2 liters/minute via nasal cannula   [facility]   Pulse oximetry, continuous   Adult wheeze protocol - initiate by RT   Bipap   I-Stat venous blood gas, [facility] ED only)   EKG 12-Lead   ED EKG     OTHER Significant initial  Findings:  labs showing:    Recent Labs  Lab 09/08/21 2133 09/08/21 2159  NA 139 138  K 4.3 4.1  CO2 23  --   GLUCOSE 127*  --   BUN 19  --   CREATININE 1.14*  --   CALCIUM 9.0  --     Cr   Up from baseline see below Lab Results  Component Value Date   CREATININE 1.14 (H) 09/08/2021   CREATININE 1.00 03/22/2021   CREATININE 0.80 01/21/2014    Recent Labs  Lab 09/08/21 2133  AST 83*  ALT 118*  ALKPHOS 252*  BILITOT 0.7  PROT 6.9  ALBUMIN 3.2*   Lab Results  Component Value Date   CALCIUM 9.0 09/08/2021        Plt: Lab Results  Component Value Date   PLT 320 09/08/2021          Component Value  Date/Time   HCO3 24.7 09/08/2021 2159   TCO2 26 09/08/2021 2159   O2SAT 99 09/08/2021 2159       Recent Labs  Lab 09/08/21 2133 09/08/21 2159  WBC 7.0  --   NEUTROABS 4.5  --   HGB 14.8 16.0*  HCT 45.7 47.0*  MCV 90.5  --   PLT 320  --     HG/HCT  stable,      Component Value Date/Time   HGB 16.0 (H) 09/08/2021 2159   HCT 47.0 (H) 09/08/2021 2159   MCV 90.5 09/08/2021 2133     Cardiac Panel (last 3 results) No results for input(s): CKTOTAL, CKMB, TROPONINI, RELINDX in the last 72 hours.  .car BNP (last 3 results) Recent Labs    09/08/21 2133  BNP 1,230.1*           Cultures:    Component Value Date/Time   SDES ABSCESS 01/21/2014 2051   SDES ABSCESS 01/21/2014 2051   SPECREQUEST RIGHT INDEX FINGER 01/21/2014 2051   SPECREQUEST RIGHT INDEX FINGER 01/21/2014 2051   CULT  01/21/2014 2051    NO ANAEROBES ISOLATED [facility]   CULT  01/21/2014 2051    ABUNDANT STAPHYLOCOCCUS AUREUS Note: RIFAMPIN AND GENTAMICIN SHOULD NOT BE USED AS SINGLE DRUGS FOR TREATMENT OF STAPH INFECTIONS. Performed at [facility]   REPTSTATUS 01/26/2014 FINAL 01/21/2014 2051   REPTSTATUS 01/24/2014 FINAL 01/21/2014 2051     Radiological Exams on Admission: DG Chest 2 View  Result Date: 09/08/2021 CLINICAL DATA:  Shortness of breath for 3 weeks. EXAM: CHEST - 2 VIEW COMPARISON:  Chest two views 01/07/2016 and 07/24/2013 FINDINGS: Cardiac silhouette is moderately enlarged and this appears unchanged to mildly worsened from prior. Mediastinal contours are within normal limits. There is interval increase in now moderate inferior and mild mid height of the lungs interstitial pulmonary edema compared to 01/07/2016. This is already been increased on 01/07/2016 compared to normal 07/24/2013 radiographs. There is likely a new tiny right pleural effusion with possible tiny left pleural effusion on lateral view. There again appears to be increased cystic lucency within  the upper lungs suggesting cystic emphysematous changes. No pneumothorax is seen. No acute skeletal abnormality. IMPRESSION: 1. There is mild-to-moderate interstitial thickening greatest within the lung bases. This is increased from 01/07/2016, which was already increased from normal radiographs 07/24/2013. This may represent progressive mild-to-moderate interstitial pulmonary edema. There also appear to be chronic lucent emphysematous changes and interstitial thickening could represent progressive interstitial lung disease. 2. Probable trace right and possible trace left pleural effusions. Electronically Signed   By: Neita Garnet M.D.   On: 09/08/2021 18:12   _______________________________________________________________________________________________________ Latest  Blood pressure (!) 198/135, pulse 92, temperature 97.8 F (36.6 C), temperature source Oral, resp. rate (!) 25, height  (1.626 m), weight 61.2 kg, last menstrual period 12/24/2015, SpO2 100 %.   Vitals  labs and radiology finding personally reviewed  Review of Systems:    Pertinent positives include:   shortness of breath at rest. dyspnea on exertion,  Constitutional:  No weight loss, night sweats, Fevers, chills, fatigue, weight loss  HEENT:  No headaches, Difficulty swallowing,Tooth/dental problems,Sore throat,  No sneezing, itching, ear ache, nasal congestion, post nasal drip,  Cardio-vascular:  No chest pain, Orthopnea, PND, anasarca, dizziness, palpitations.no Bilateral lower extremity swelling  GI:  No heartburn, indigestion, abdominal pain, nausea, vomiting, diarrhea, change in bowel habits, loss of appetite, melena, blood in stool, hematemesis Resp:  no No excess mucus, no productive cough, No non-productive cough, No coughing up of blood.No change in color of mucus.No wheezing. Skin:  no rash or lesions. No jaundice GU:  no dysuria, change in color of urine, no urgency or frequency. No straining to urinate.  No  flank pain.  Musculoskeletal:  No joint pain or no joint swelling. No decreased range of motion. No back pain.  Psych:  No change in mood or affect. No depression or anxiety. No memory loss.  Neuro: no localizing neurological complaints, no tingling, no weakness, no double vision, no gait abnormality, no slurred speech, no confusion  All systems reviewed and apart from HOPI all are negative _______________________________________________________________________________________________ Past Medical History:   Past Medical History:  Diagnosis Date   Hypertension       Past Surgical History:  Procedure Laterality Date   I & D EXTREMITY Right 01/21/2014   Procedure: IRRIGATION AND DEBRIDEMENT RIGHT INDEX FINGER;  Surgeon: Betha Loa, MD;  Location: [facility] OR;  Service: Orthopedics;  Laterality: Right;    Social History:  Ambulatory   independently      reports that she has been smoking cigarettes. She has never used smokeless tobacco. She reports current alcohol use. She reports that she does not use drugs.     Family History: Mother with coronary artery disease ______________________________________________________________________________________________ Allergies: No Known Allergies   Prior to Admission medications   Medication Sig Start Date End Date Taking? Authorizing Provider  acetaminophen (TYLENOL) 500 MG tablet Take 1,000 mg by mouth  every 6 (six) hours as needed for moderate pain or headache.   Yes [provider]  Naproxen Sod-diphenhydrAMINE (ALEVE PM PO) Take 2 tablets by mouth at bedtime as needed (sleep).   Yes [provider]    ___________________________________________________________________________________________________ Physical Exam:    09/08/2021   10:40 PM 09/08/2021   10:35 PM 09/08/2021   10:30 PM  Vitals with BMI  Systolic 198 197 347  Diastolic 135 134 425  Pulse 92 96 95     1. General:  in No  Acute distress on BIPAP   Chronically ill   -appearing 2. Psychological: Alert and   Oriented 3. Head/ENT:   Moist   Mucous Membranes                          Head Non traumatic, neck supple                          Poor Dentition 4. SKIN: normal   Skin turgor,  Skin clean Dry and intact no rash 5. Heart: Regular rate and rhythm no  Murmur, no Rub or gallop 6. Lungs: no wheezes some crackles   7. Abdomen: Soft,  non-tender,  distended bowel sounds present 8. Lower extremities: no clubbing, cyanosis, no  edema 9. Neurologically Grossly intact, moving all 4 extremities equally  10. MSK: Normal range of motion    Chart has been reviewed  ______________________________________________________________________________________________  Assessment/Plan   61 y.o. female with medical history significant of HTN Medication noncompliance, polysubstance abuse tobacco abuse   Admitted for hypertensive emergency    Present on Admission:  Hypertensive emergency  Elevated troponin  Pulmonary edema  Tobacco abuse  Cocaine abuse (HCC)  History of alcohol abuse  Abnormal chest x-ray  Acute respiratory distress     Hypertensive emergency Continue nitro drip and cycle cardiac enzymes Echo in the morning If abnormal will likely need cardiology follow-up. Check TSH check urine for presence of proteinuria   Elevated troponin In the setting of hypertensive emergency.  Obtain echogram continue cycle cardiac enzymes. If echo abnormal will probably benefit from cardiology consult  Pulmonary edema In the setting of hypertensive emergency. Diuresis in the emergency department also given a nitro drip And started on BiPAP Respiratory status improving   Tobacco abuse  - Spoke about importance of quitting spent 5 minutes discussing options for treatment, prior attempts at quitting, and dangers of smoking  -At this point patient is  interested in quitting  - order nicotine patch   - nursing tobacco cessation  protocol   Cocaine abuse (HCC) Spoke at length regarding importance of quitting cocaine.  Avoid beta-blockers.  Check echogram. Severe hypertension could be in the setting of cocaine use.  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  Monitor for any sign of withdrawal  Abnormal chest x-ray Chest x-ray consistent with fluid overload but also cannot rule out interstitial lung disease.  Once patient is diuresed and volume down would benefit from repeat imaging to further evaluate any underlying lung disease especially in the case of cocaine abuse as this can induce lung damage as well  Acute respiratory distress Patient noted to have increased respirations although not hypoxic.  For increased work of breathing and severe hypertension was put on BiPAP now with some improvement.  Continue BiPAP for now    Other plan as per orders.  DVT prophylaxis: Lovenox  Code Status:    Code Status: Not on file FULL CODE  as per patient   I had personally discussed CODE STATUS with patient     Family Communication:   Family not at  Bedside    Disposition Plan:     To home once workup is complete and patient is stable   Following barriers for discharge:                           Blood pressure under control                     Transition of care consulted                                  Consults called: If abnormal echogram would recommend cardiology consult  Admission status:  ED Disposition     ED Disposition  Admit   Condition  --   Comment  Hospital Area: [facility] [[id number]]  Level of Care: Progressive [102]  Admit to Progressive based on following criteria: CARDIOVASCULAR & THORACIC of moderate stability with acute coronary syndrome symptoms/low risk myocardial infarction/hypertensive urgency/arrhythmias/heart failure potentially compromising stability and stable post cardiovascular intervention patients.  May admit patient to [facility] or  [facility] if equivalent level of care is available:: No  Covid Evaluation: Asymptomatic - no recent exposure (last 10 days) testing not required  Diagnosis: Hypertensive emergency [phone]  Admitting Physician: Therisa Doyne [[id number]]  Attending Physician: Therisa Doyne [[id number]]  Estimated length of stay: past midnight tomorrow  Certification:: I certify this patient will need inpatient services for at least 2 midnights           inpatient     I Expect 2 midnight stay secondary to severity of patient's current illness need for inpatient interventions justified by the following:  hemodynamic instability despite optimal treatment (tachycardia *hypertension * tachypnea * )  Severe lab/radiological/exam abnormalities including:   Elevated troponin and extensive comorbidities including:  substance abuse     That are currently affecting medical management.   I expect  patient to be hospitalized for 2 midnights requiring inpatient medical care.  Patient is at high risk for adverse outcome (such as loss of life or disability) if not treated.  Indication for inpatient stay as follows:    CVA hypertension Need for IV nitro drip need for biPAP    Level of care        progressive tele indefinitely please discontinue once patient no longer qualifies COVID-19 Labs        Edda Orea 09/08/2021, 12:01 AM    [facility]     after 2 AM please page floor coverage PA If 7AM-7PM, please contact the day team taking care of the patient using [email or web address]   Patient was evaluated in the context of the global COVID-19 pandemic, which necessitated consideration that the patient might be at risk for infection with the SARS-CoV-2 virus that causes COVID-19. Institutional protocols and algorithms that pertain to the evaluation of patients at risk for COVID-19 are in a state of rapid change based on information released by regulatory bodies including the CDC and federal and  state organizations. These policies and algorithms were followed during the patient's care.

## 2021-09-08 NOTE — Assessment & Plan Note (Signed)
Spoke at length regarding importance of quitting cocaine.  Avoid beta-blockers.  Check echogram. Severe hypertension could be in the setting of cocaine use.

## 2021-09-08 NOTE — Assessment & Plan Note (Signed)
-   Spoke about importance of quitting spent 5 minutes discussing options for treatment, prior attempts at quitting, and dangers of smoking ? -At this point patient is    interested in quitting ? - order nicotine patch  ? - nursing tobacco cessation protocol ? ?

## 2021-09-08 NOTE — ED Triage Notes (Addendum)
Pt arrived to ED via POV w/ c/o shob x 3 weeks. Pt reports feeling like she can't catch her breath and that she can't sleep. Pt tachypneic, shallow and labored breathing. Pt BP 221/151 and reports hx of HTN, but hasn't taken BP meds for years d/t cost.

## 2021-09-08 NOTE — ED Provider Notes (Signed)
[facility] EMERGENCY DEPARTMENT Provider Note   CSN: [id number] Arrival date & time: 09/08/21  1700     History  Chief Complaint  Patient presents with   Shortness of Breath    Amanda [name] is a 61 y.o. female with a history of hypertension presenting to the ED with shortness of breath.  Patient states that she has had gradual shortness of breath for the past 3 weeks.  She states that she is unable to ambulate even short distances due to severe shortness of breath.  She states that it feels like she cannot catch her breath.  She does endorse some central chest pain, but is unable to describe the pain.  Denies any fevers.  She does endorse a productive cough of clear sputum.  No abdominal pain, nausea, vomiting, diarrhea.  No lower extremity swelling.  She states that she does have a history of high blood pressure but has not been on her hypertensive medications in several years.   Shortness of Breath Associated symptoms: chest pain and cough   Associated symptoms: no abdominal pain and no vomiting       Home Medications Prior to Admission medications   Medication Sig Start Date End Date Taking? Authorizing Provider  acetaminophen (TYLENOL) 500 MG tablet Take 1,000 mg by mouth every 6 (six) hours as needed for moderate pain or headache.   Yes [provider]  Naproxen Sod-diphenhydrAMINE (ALEVE PM PO) Take 2 tablets by mouth at bedtime as needed (sleep).   Yes [provider]      Allergies    Patient has no known allergies.    Review of Systems   Review of Systems  Constitutional:  Positive for fatigue.  Respiratory:  Positive for cough and shortness of breath.   Cardiovascular:  Positive for chest pain. Negative for leg swelling.  Gastrointestinal:  Negative for abdominal pain, diarrhea, nausea and vomiting.  Neurological:  Positive for weakness. Negative for syncope and light-headedness.   Physical Exam Updated Vital Signs BP (!)  206/133   Pulse 92   Temp 97.8 F (36.6 C) (Oral)   Resp (!) 30   Ht 5\' 4"  (1.626 m)   Wt 61.2 kg   LMP 12/24/2015 (Within Days)   SpO2 99%   BMI 23.17 kg/m  Physical Exam Constitutional:      Appearance: She is obese. She is ill-appearing. She is not diaphoretic.  HENT:     Head: Normocephalic and atraumatic.     Right Ear: External ear normal.     Left Ear: External ear normal.     Nose: Nose normal.  Eyes:     General: No scleral icterus. Cardiovascular:     Rate and Rhythm: Regular rhythm. Tachycardia present.     Heart sounds: No murmur heard.   No friction rub. No gallop.  Pulmonary:     Effort: Tachypnea present. No accessory muscle usage.     Breath sounds: No stridor. Examination of the right-lower field reveals rales. Examination of the left-lower field reveals rales. Rales present.  Abdominal:     General: There is distension.     Palpations: Abdomen is soft.     Tenderness: There is no abdominal tenderness. There is no guarding or rebound.  Musculoskeletal:     Cervical back: Neck supple.     Right lower leg: No tenderness. No edema.     Left lower leg: No tenderness. No edema.  Skin:    General: Skin is warm  and dry.  Neurological:     General: No focal deficit present.     Mental Status: She is alert and oriented to person, place, and time.    ED Results / Procedures / Treatments   Labs (all labs ordered are listed, but only abnormal results are displayed) Labs Reviewed  COMPREHENSIVE METABOLIC PANEL - Abnormal; Notable for the following components:      Result Value   Glucose, Bld 127 (*)    Creatinine, Ser 1.14 (*)    Albumin 3.2 (*)    AST 83 (*)    ALT 118 (*)    Alkaline Phosphatase 252 (*)    GFR, Estimated 55 (*)    All other components within normal limits  BRAIN NATRIURETIC PEPTIDE - Abnormal; Notable for the following components:   B Natriuretic Peptide 1,230.1 (*)    All other components within normal limits  I-STAT VENOUS BLOOD  GAS, ED - Abnormal; Notable for the following components:   pH, Ven 7.508 (*)    pCO2, Ven 31.2 (*)    pO2, Ven 145 (*)    Calcium, Ion 1.05 (*)    HCT 47.0 (*)    Hemoglobin 16.0 (*)    All other components within normal limits  TROPONIN I (HIGH SENSITIVITY) - Abnormal; Notable for the following components:   Troponin I (High Sensitivity) 95 (*)    All other components within normal limits  SARS CORONAVIRUS 2 BY RT PCR  CBC WITH DIFFERENTIAL/PLATELET  CK  HEPATIC FUNCTION PANEL  MAGNESIUM  PHOSPHORUS  TSH  URINALYSIS, COMPLETE (UACMP) WITH MICROSCOPIC  RAPID URINE DRUG SCREEN, HOSP PERFORMED  CREATININE, URINE, RANDOM  OSMOLALITY  OSMOLALITY, URINE  SODIUM, URINE, RANDOM  D-DIMER, QUANTITATIVE  TROPONIN I (HIGH SENSITIVITY)  TROPONIN I (HIGH SENSITIVITY)    EKG EKG Interpretation  Date/Time:  Monday Sep 08 2021 17:28:27 EDT Ventricular Rate:  103 PR Interval:  140 QRS Duration: 76 QT Interval:  372 QTC Calculation: 487 R Axis:   63 Text Interpretation: Sinus tachycardia Biatrial enlargement Nonspecific ST and T wave abnormality Abnormal ECG When compared with ECG of 07-Jan-2016 08:15, No significant change since last tracing Confirmed by Alvira MondaySchlossman, Erin ([id number]) on 09/08/2021 6:40:44 PM  Radiology DG Chest 2 View  Result Date: 09/08/2021 CLINICAL DATA:  Shortness of breath for 3 weeks. EXAM: CHEST - 2 VIEW COMPARISON:  Chest two views 01/07/2016 and 07/24/2013 FINDINGS: Cardiac silhouette is moderately enlarged and this appears unchanged to mildly worsened from prior. Mediastinal contours are within normal limits. There is interval increase in now moderate inferior and mild mid height of the lungs interstitial pulmonary edema compared to 01/07/2016. This is already been increased on 01/07/2016 compared to normal 07/24/2013 radiographs. There is likely a new tiny right pleural effusion with possible tiny left pleural effusion on lateral view. There again appears to be  increased cystic lucency within the upper lungs suggesting cystic emphysematous changes. No pneumothorax is seen. No acute skeletal abnormality. IMPRESSION: 1. There is mild-to-moderate interstitial thickening greatest within the lung bases. This is increased from 01/07/2016, which was already increased from normal radiographs 07/24/2013. This may represent progressive mild-to-moderate interstitial pulmonary edema. There also appear to be chronic lucent emphysematous changes and interstitial thickening could represent progressive interstitial lung disease. 2. Probable trace right and possible trace left pleural effusions. Electronically Signed   By: Neita Garnetonald  Viola M.D.   On: 09/08/2021 18:12    Procedures Procedures    Medications Ordered in ED Medications  nitroGLYCERIN (NITROSTAT)  SL tablet 0.4 mg (0.4 mg Sublingual Given 09/08/21 1931)  nitroGLYCERIN 50 mg in dextrose 5 % 250 mL (0.2 mg/mL) infusion (150 mcg/min Intravenous Rate/Dose Change 09/08/21 2238)  furosemide (LASIX) injection 20 mg (20 mg Intravenous Given 09/08/21 2010)    ED Course/ Medical Decision Making/ A&P                           Medical Decision Making Amount and/or Complexity of Data Reviewed Labs: ordered. Radiology: ordered.  Risk Prescription drug management. Decision regarding hospitalization.   61 year old female with a history of hypertension presenting to the ED with gradually worsening shortness of breath for the past 3 weeks.  On exam, patient is noted to be markedly hypertensive with systolic blood pressures greater than 200.  She is also tachypneic into the 30s.  She is only able to speak in short sentences due to her shortness of breath and tachypnea.  She does have bibasilar crackles on examination, but no wheezing or rhonchi.  Patient initially given sublingual nitro with no improvement and remains tachypneic and in mild to moderate respiratory distress.  Given her hypertension and concerns for pulmonary  edema on her lung exam, she was initiated on BiPAP and a nitroglycerin drip for hypertensive urgency.  She was also given IV Lasix.  Chest x-ray obtained does show concern for mild to moderate interstitial pulmonary edema as well as chronic emphysematous changes.  Also shown to have trace right and left pleural effusions.  ECG showed sinus tachycardia with nonspecific ST and T wave abnormalities, largely unchanged from prior.  On reevaluation on BiPAP, patient appears much more comfortable and her respiratory rate has decreased to the low 20s.  Her blood pressure is slowly decreasing with uptitration of the nitro drip.  VBG does not show hypercapnia.  pH is 7.5 likely related to her hyperventilation.  CBC is within normal limits.  CMP with LFT elevations (AST 83, ALT 118).  Creatinine is slightly elevated to 1.14 which appears increased from prior (baseline appears to be around 0.8).  Her BNP is elevated to 1200 and her troponin is elevated to 95, likely related to fluid overload/pulmonary edema.  Hospitalist team was contacted for admission the patient was admitted to their service in stable condition.        Final Clinical Impression(s) / ED Diagnoses Final diagnoses:  Hypertensive urgency  SOB (shortness of breath)    Rx / DC Orders ED Discharge Orders     None         Laurence Compton, MD 09/08/21 [id number]    Alvira Monday, MD 09/09/21 1242

## 2021-09-08 NOTE — Assessment & Plan Note (Signed)
In the setting of hypertensive emergency. Diuresis in the emergency department also given a nitro drip And started on BiPAP Respiratory status improving

## 2021-09-09 ENCOUNTER — Encounter (HOSPITAL_COMMUNITY): Payer: Self-pay | Admitting: Internal Medicine

## 2021-09-09 ENCOUNTER — Inpatient Hospital Stay (HOSPITAL_COMMUNITY): Payer: Self-pay

## 2021-09-09 DIAGNOSIS — R0609 Other forms of dyspnea: Secondary | ICD-10-CM

## 2021-09-09 DIAGNOSIS — R778 Other specified abnormalities of plasma proteins: Secondary | ICD-10-CM

## 2021-09-09 DIAGNOSIS — I5031 Acute diastolic (congestive) heart failure: Secondary | ICD-10-CM

## 2021-09-09 LAB — URINALYSIS, COMPLETE (UACMP) WITH MICROSCOPIC
Bacteria, UA: NONE SEEN
Bilirubin Urine: NEGATIVE
Glucose, UA: NEGATIVE mg/dL
Ketones, ur: NEGATIVE mg/dL
Leukocytes,Ua: NEGATIVE
Nitrite: NEGATIVE
Protein, ur: NEGATIVE mg/dL
Specific Gravity, Urine: 1.008 (ref 1.005–1.030)
pH: 6 (ref 5.0–8.0)

## 2021-09-09 LAB — HEPATIC FUNCTION PANEL
ALT: 95 U/L — ABNORMAL HIGH (ref 0–44)
AST: 68 U/L — ABNORMAL HIGH (ref 15–41)
Albumin: 2.7 g/dL — ABNORMAL LOW (ref 3.5–5.0)
Alkaline Phosphatase: 220 U/L — ABNORMAL HIGH (ref 38–126)
Bilirubin, Direct: 0.2 mg/dL (ref 0.0–0.2)
Indirect Bilirubin: 0.3 mg/dL (ref 0.3–0.9)
Total Bilirubin: 0.5 mg/dL (ref 0.3–1.2)
Total Protein: 5.9 g/dL — ABNORMAL LOW (ref 6.5–8.1)

## 2021-09-09 LAB — CBC
HCT: 39.5 % (ref 36.0–46.0)
Hemoglobin: 13 g/dL (ref 12.0–15.0)
MCH: 29.8 pg (ref 26.0–34.0)
MCHC: 32.9 g/dL (ref 30.0–36.0)
MCV: 90.6 fL (ref 80.0–100.0)
Platelets: 261 10*3/uL (ref 150–400)
RBC: 4.36 MIL/uL (ref 3.87–5.11)
RDW: 14.8 % (ref 11.5–15.5)
WBC: 7.1 10*3/uL (ref 4.0–10.5)
nRBC: 0 % (ref 0.0–0.2)

## 2021-09-09 LAB — COMPREHENSIVE METABOLIC PANEL
ALT: 90 U/L — ABNORMAL HIGH (ref 0–44)
AST: 65 U/L — ABNORMAL HIGH (ref 15–41)
Albumin: 2.7 g/dL — ABNORMAL LOW (ref 3.5–5.0)
Alkaline Phosphatase: 195 U/L — ABNORMAL HIGH (ref 38–126)
Anion gap: 8 (ref 5–15)
BUN: 16 mg/dL (ref 6–20)
CO2: 26 mmol/L (ref 22–32)
Calcium: 8.5 mg/dL — ABNORMAL LOW (ref 8.9–10.3)
Chloride: 102 mmol/L (ref 98–111)
Creatinine, Ser: 1.16 mg/dL — ABNORMAL HIGH (ref 0.44–1.00)
GFR, Estimated: 54 mL/min — ABNORMAL LOW (ref >60)
Glucose, Bld: 105 mg/dL — ABNORMAL HIGH (ref 70–99)
Potassium: 3.6 mmol/L (ref 3.5–5.1)
Sodium: 136 mmol/L (ref 135–145)
Total Bilirubin: 0.9 mg/dL (ref 0.3–1.2)
Total Protein: 5.7 g/dL — ABNORMAL LOW (ref 6.5–8.1)

## 2021-09-09 LAB — HIV ANTIBODY (ROUTINE TESTING W REFLEX): HIV Screen 4th Generation wRfx: NONREACTIVE

## 2021-09-09 LAB — RAPID URINE DRUG SCREEN, HOSP PERFORMED
Amphetamines: NOT DETECTED
Barbiturates: NOT DETECTED
Benzodiazepines: NOT DETECTED
Cocaine: POSITIVE — AB
Opiates: NOT DETECTED
Tetrahydrocannabinol: NOT DETECTED

## 2021-09-09 LAB — SODIUM, URINE, RANDOM: Sodium, Ur: 111 mmol/L

## 2021-09-09 LAB — ECHOCARDIOGRAM COMPLETE
AR max vel: 2.88 cm2
AV Peak grad: 13.4 mmHg
Ao pk vel: 1.83 m/s
Area-P 1/2: 4.36 cm2
Calc EF: 58.1 %
Height: 64 in
S' Lateral: 3 cm
Single Plane A2C EF: 57.6 %
Single Plane A4C EF: 58.8 %
Weight: 2160 oz

## 2021-09-09 LAB — MAGNESIUM
Magnesium: 1.8 mg/dL (ref 1.7–2.4)
Magnesium: 1.7 mg/dL (ref 1.7–2.4)

## 2021-09-09 LAB — CREATININE, URINE, RANDOM: Creatinine, Urine: 25.7 mg/dL

## 2021-09-09 LAB — TROPONIN I (HIGH SENSITIVITY)
Troponin I (High Sensitivity): 104 ng/L (ref <18)
Troponin I (High Sensitivity): 125 ng/L (ref <18)

## 2021-09-09 LAB — OSMOLALITY, URINE: Osmolality, Ur: 330 mOsm/kg (ref 300–900)

## 2021-09-09 LAB — OSMOLALITY: Osmolality: 297 mOsm/kg — ABNORMAL HIGH (ref 275–295)

## 2021-09-09 LAB — MRSA NEXT GEN BY PCR, NASAL: MRSA by PCR Next Gen: NOT DETECTED

## 2021-09-09 LAB — CK: Total CK: 274 U/L — ABNORMAL HIGH (ref 38–234)

## 2021-09-09 LAB — PHOSPHORUS
Phosphorus: 4 mg/dL (ref 2.5–4.6)
Phosphorus: 3.7 mg/dL (ref 2.5–4.6)

## 2021-09-09 LAB — SARS CORONAVIRUS 2 BY RT PCR: SARS Coronavirus 2 by RT PCR: NEGATIVE

## 2021-09-09 LAB — D-DIMER, QUANTITATIVE: D-Dimer, Quant: 1.26 ug/mL-FEU — ABNORMAL HIGH (ref 0.00–0.50)

## 2021-09-09 MED ORDER — ACETAMINOPHEN 325 MG PO TABS
650.0000 mg | ORAL_TABLET | Freq: Four times a day (QID) | ORAL | Status: DC | PRN
  Administered 2021-09-10: 650 mg via ORAL
  Filled 2021-09-09: qty 2

## 2021-09-09 MED ORDER — SODIUM CHLORIDE 0.9% FLUSH
3.0000 mL | INTRAVENOUS | Status: DC | PRN
  Administered 2021-09-09: 3 mL via INTRAVENOUS

## 2021-09-09 MED ORDER — LISINOPRIL 10 MG PO TABS
10.0000 mg | ORAL_TABLET | Freq: Every day | ORAL | Status: DC
  Administered 2021-09-09: 10 mg via ORAL
  Filled 2021-09-09: qty 1

## 2021-09-09 MED ORDER — ACETAMINOPHEN 650 MG RE SUPP: 650.0000 mg | Freq: Four times a day (QID) | RECTAL | Status: DC | PRN

## 2021-09-09 MED ORDER — POTASSIUM CHLORIDE CRYS ER 20 MEQ PO TBCR
40.0000 meq | EXTENDED_RELEASE_TABLET | Freq: Once | ORAL | Status: AC
  Administered 2021-09-09: 40 meq via ORAL
  Filled 2021-09-09: qty 2

## 2021-09-09 MED ORDER — ENOXAPARIN SODIUM 40 MG/0.4ML IJ SOSY
40.0000 mg | PREFILLED_SYRINGE | INTRAMUSCULAR | Status: DC
  Administered 2021-09-10: 40 mg via SUBCUTANEOUS
  Filled 2021-09-09: qty 0.4

## 2021-09-09 MED ORDER — HYDRALAZINE HCL 20 MG/ML IJ SOLN
5.0000 mg | INTRAMUSCULAR | Status: DC | PRN
  Administered 2021-09-09 (×3): 5 mg via INTRAVENOUS
  Filled 2021-09-09 (×3): qty 1

## 2021-09-09 MED ORDER — SODIUM CHLORIDE 0.9% FLUSH
3.0000 mL | Freq: Two times a day (BID) | INTRAVENOUS | Status: DC
  Administered 2021-09-09 – 2021-09-10 (×3): 3 mL via INTRAVENOUS

## 2021-09-09 MED ORDER — MAGNESIUM SULFATE 4 GM/100ML IV SOLN
4.0000 g | Freq: Once | INTRAVENOUS | Status: AC
Start: 2021-09-09 — End: 2021-09-09
  Administered 2021-09-09: 4 g via INTRAVENOUS
  Filled 2021-09-09: qty 100

## 2021-09-09 MED ORDER — AMLODIPINE BESYLATE 10 MG PO TABS
10.0000 mg | ORAL_TABLET | Freq: Every day | ORAL | Status: DC
  Administered 2021-09-09 – 2021-09-10 (×2): 10 mg via ORAL
  Filled 2021-09-09: qty 2
  Filled 2021-09-09: qty 1

## 2021-09-09 MED ORDER — SODIUM CHLORIDE 0.9 % IV SOLN: 250.0000 mL | INTRAVENOUS | Status: DC | PRN

## 2021-09-09 MED ORDER — ENOXAPARIN SODIUM 30 MG/0.3ML IJ SOSY
30.0000 mg | PREFILLED_SYRINGE | INTRAMUSCULAR | Status: DC
  Administered 2021-09-09: 30 mg via SUBCUTANEOUS
  Filled 2021-09-09: qty 0.3

## 2021-09-09 MED ORDER — HYDROCODONE-ACETAMINOPHEN 5-325 MG PO TABS
1.0000 | ORAL_TABLET | ORAL | Status: DC | PRN
  Administered 2021-09-09: 2 via ORAL
  Filled 2021-09-09: qty 2

## 2021-09-09 MED ORDER — FUROSEMIDE 10 MG/ML IJ SOLN
40.0000 mg | Freq: Every day | INTRAMUSCULAR | Status: DC
Start: 2021-09-09 — End: 2021-09-10
  Administered 2021-09-09 – 2021-09-10 (×2): 40 mg via INTRAVENOUS
  Filled 2021-09-09 (×2): qty 4

## 2021-09-09 MED ORDER — IOHEXOL 350 MG/ML SOLN
100.0000 mL | Freq: Once | INTRAVENOUS | Status: AC | PRN
  Administered 2021-09-09: 70 mL via INTRAVENOUS

## 2021-09-09 NOTE — ED Notes (Signed)
Breakfast order placed ?

## 2021-09-09 NOTE — ED Notes (Signed)
Echo at bedside. Pt switched to 3LPM [place] at this time with O2 at 96% on the monitor.

## 2021-09-09 NOTE — Progress Notes (Addendum)
PROGRESS NOTE    Amanda [name]  JXB:[id number] DOB: [birth year] DOA: 09/08/2021 PCP: Pcp, No   Brief Narrative:  This 61 years old female with PMH significant for essential hypertension, substance abuse, tobacco abuse presented in the ED with complaints of shortness of breath for last 3 weeks.  Patient reports she could not sleep flat, also having cough for past 3 weeks but denies any fever. She continues to smoke a pack regularly.  Patient reports she could not walk even short distance without getting short of breath.  She reports occasional cocaine use. On arrival in the ED her blood pressure was 221/151 associated with shortness of breath. Patient was started on nitro drip and also required BiPAP for hypoxic respiratory failure. Patient is admitted for hypertensive emergency, started on nitro drip.  BP is improving.  Cardiology is consulted.  Assessment & Plan:   Principal Problem:   Hypertensive emergency Active Problems:   Elevated troponin   Pulmonary edema   Tobacco abuse   Cocaine abuse (HCC)   History of alcohol abuse   Abnormal chest x-ray   Acute respiratory distress  Hypertensive emergency: Patient presented with cough, shortness of breath requiring BiPAP and blood pressure 220/151.  Troponin trending up. Continue nitro drip and trend troponins. Blood pressure has improved, started on amlodipine and lisinopril. Try to wean nitro drip and discontinue. Cardiology is consulted,  blood pressure has improved.  Acute diastolic CHF: Patient presented with shortness of breath, cough.  Likely in the setting of hypertensive emergency. Chest x-ray consistent with fluid overload.  BNP 1230. Patient was given IV diuresis Echocardiogram shows LVEF 55 to 60% no regional wall motion abnormality. Continue Lasix 40 mg IV daily, initiated on lisinopril. Monitor daily weight, intake output charting.  Elevated troponin: Likely demand ischemia in the setting of hypertensive  emergency. Troponin 95> 104> 125.  EKG: Sinus rhythm, bilateral atrial enlargement Echo : LVEF 55 to 60%.  No regional wall motion abnormality.  Mild small pericardial effusion.   Cardiology is consulted.   Cocaine abuse: UDS positive for cocaine+, avoid beta-blockers. Echocardiogram shows LVEF 55 to 60%.  Normal LVEF. Severe hypertension could be in the setting of cocaine use.  History of alcohol abuse: Currently in remission.  Monitor for any signs of withdrawal.  Tobacco use: Counseled in detail.  Nicotine patch.  DVT prophylaxis: Lovenox Code Status: Full code Family Communication: No family at bedside Disposition Plan:   Status is: Inpatient Remains inpatient appropriate because: Admitted for hypertensive emergency, acute diastolic CHF requiring IV diuresis.  Blood pressure is improving after patient placed on nitro drip.  Cardiology is consulted   Consultants:  Cardiology  Procedures: Echocardiogram Antimicrobials: None  Subjective: Patient was seen and examined at bedside.  Overnight events noted.   Patient reports feeling improved.  She denies any chest pain.  Blood pressure is improving  Objective: Vitals:   09/09/21 1015 09/09/21 1130 09/09/21 1215 09/09/21 1230  BP: (!) 144/88 (!) 160/103 (!) 142/81 (!) 164/95  Pulse: 98 99 90 94  Resp: (!) 23 20 18 19   Temp:      TempSrc:      SpO2: 99% 99% 99% 100%  Weight:      Height:        Intake/Output Summary (Last 24 hours) at 09/09/2021 1309 Last data filed at 09/09/2021 1134 Gross per 24 hour  Intake 528.52 ml  Output 1750 ml  Net -1221.48 ml   Filed Weights   09/08/21 1729  Weight: 61.2  kg    Examination:  General exam: Appears comfortable, not in any acute distress. Respiratory system: CTA bilaterally, no wheezing, no crackles, normal respiratory effort. Cardiovascular system: S1-S2 heard, regular rate and rhythm, no murmur. Gastrointestinal system: Abdomen is soft, non tender, non distended,  BS+ Central nervous system: Alert and oriented x 3 . No focal neurological deficits. Extremities: No edema, no cyanosis, no clubbing. Skin: No rashes, lesions or ulcers Psychiatry: Judgement and insight appear normal. Mood & affect appropriate.     Data Reviewed: I have personally reviewed following labs and imaging studies  CBC: Recent Labs  Lab 09/08/21 2133 09/08/21 2159 09/09/21 0340  WBC 7.0  --  7.1  NEUTROABS 4.5  --   --   HGB 14.8 16.0* 13.0  HCT 45.7 47.0* 39.5  MCV 90.5  --  90.6  PLT 320  --  261   Basic Metabolic Panel: Recent Labs  Lab 09/08/21 2133 09/08/21 2159 09/09/21 0102 09/09/21 0340  NA 139 138  --  136  K 4.3 4.1  --  3.6  CL 106  --   --  102  CO2 23  --   --  26  GLUCOSE 127*  --   --  105*  BUN 19  --   --  16  CREATININE 1.14*  --   --  1.16*  CALCIUM 9.0  --   --  8.5*  MG  --   --  1.8 1.7  PHOS  --   --  4.0 3.7   GFR: Estimated Creatinine Clearance: 45.1 mL/min (A) (by C-G formula based on SCr of 1.16 mg/dL (H)). Liver Function Tests: Recent Labs  Lab 09/08/21 2133 09/09/21 0102 09/09/21 0340  AST 83* 68* 65*  ALT 118* 95* 90*  ALKPHOS 252* 220* 195*  BILITOT 0.7 0.5 0.9  PROT 6.9 5.9* 5.7*  ALBUMIN 3.2* 2.7* 2.7*   No results for input(s): LIPASE, AMYLASE in the last 168 hours. No results for input(s): AMMONIA in the last 168 hours. Coagulation Profile: No results for input(s): INR, PROTIME in the last 168 hours. Cardiac Enzymes: Recent Labs  Lab 09/09/21 0102  CKTOTAL 274*   BNP (last 3 results) No results for input(s): PROBNP in the last 8760 hours. HbA1C: No results for input(s): HGBA1C in the last 72 hours. CBG: No results for input(s): GLUCAP in the last 168 hours. Lipid Profile: No results for input(s): CHOL, HDL, LDLCALC, TRIG, CHOLHDL, LDLDIRECT in the last 72 hours. Thyroid Function Tests: No results for input(s): TSH, T4TOTAL, FREET4, T3FREE, THYROIDAB in the last 72 hours. Anemia Panel: No results  for input(s): VITAMINB12, FOLATE, FERRITIN, TIBC, IRON, RETICCTPCT in the last 72 hours. Sepsis Labs: No results for input(s): PROCALCITON, LATICACIDVEN in the last 168 hours.  Recent Results (from the past 240 hour(s))  SARS Coronavirus 2 by RT PCR (hospital order, performed in [facility] hospital lab) *cepheid single result test* Anterior Nasal Swab     Status: None   Collection Time: 09/09/21 12:13 AM   Specimen: Anterior Nasal Swab  Result Value Ref Range Status   SARS Coronavirus 2 by RT PCR NEGATIVE NEGATIVE Final    Comment: (NOTE) SARS-CoV-2 target nucleic acids are NOT DETECTED.  The SARS-CoV-2 RNA is generally detectable in upper and lower respiratory specimens during the acute phase of infection. The lowest concentration of SARS-CoV-2 viral copies this assay can detect is 250 copies / mL. A negative result does not preclude SARS-CoV-2 infection and should not be  used as the sole basis for treatment or other patient management decisions.  A negative result may occur with improper specimen collection / handling, submission of specimen other than nasopharyngeal swab, presence of viral mutation(s) within the areas targeted by this assay, and inadequate number of viral copies (<250 copies / mL). A negative result must be combined with clinical observations, patient history, and epidemiological information.  Fact Sheet for Patients:   RoadLapTop.co.za  Fact Sheet for Healthcare Providers: http://kim-miller.com/  This test is not yet approved or  cleared by the [place] FDA and has been authorized for detection and/or diagnosis of SARS-CoV-2 by FDA under an Emergency Use Authorization (EUA).  This EUA will remain in effect (meaning this test can be used) for the duration of the COVID-19 declaration under Section 564(b)(1) of the Act, 21 U.S.C. section 360bbb-3(b)(1), unless the authorization is terminated or revoked  sooner.  Performed at [facility], [address], [place], [place] [address]          Radiology Studies: DG Chest 2 View  Result Date: 09/08/2021 CLINICAL DATA:  Shortness of breath for 3 weeks. EXAM: CHEST - 2 VIEW COMPARISON:  Chest two views 01/07/2016 and 07/24/2013 FINDINGS: Cardiac silhouette is moderately enlarged and this appears unchanged to mildly worsened from prior. Mediastinal contours are within normal limits. There is interval increase in now moderate inferior and mild mid height of the lungs interstitial pulmonary edema compared to 01/07/2016. This is already been increased on 01/07/2016 compared to normal 07/24/2013 radiographs. There is likely a new tiny right pleural effusion with possible tiny left pleural effusion on lateral view. There again appears to be increased cystic lucency within the upper lungs suggesting cystic emphysematous changes. No pneumothorax is seen. No acute skeletal abnormality. IMPRESSION: 1. There is mild-to-moderate interstitial thickening greatest within the lung bases. This is increased from 01/07/2016, which was already increased from normal radiographs 07/24/2013. This may represent progressive mild-to-moderate interstitial pulmonary edema. There also appear to be chronic lucent emphysematous changes and interstitial thickening could represent progressive interstitial lung disease. 2. Probable trace right and possible trace left pleural effusions. Electronically Signed   By: Neita Garnet M.D.   On: 09/08/2021 18:12   DG Abd 1 View  Result Date: 09/09/2021 CLINICAL DATA:  Abdominal distension EXAM: ABDOMEN - 1 VIEW COMPARISON:  None Available. FINDINGS: The bowel gas pattern is normal. No radio-opaque calculi or other significant radiographic abnormality are seen. IMPRESSION: Negative. Electronically Signed   By: Charlett Nose M.D.   On: 09/09/2021 00:10   CT Angio Chest Pulmonary Embolism (PE) W or WO Contrast  Result Date:  09/09/2021 CLINICAL DATA:  61 year old female with shortness of breath and abdominal distension. EXAM: CT ANGIOGRAPHY CHEST WITH CONTRAST TECHNIQUE: Multidetector CT imaging of the chest was performed using the standard protocol during bolus administration of intravenous contrast. Multiplanar CT image reconstructions and MIPs were obtained to evaluate the vascular anatomy. RADIATION DOSE REDUCTION: This exam was performed according to the departmental dose-optimization program which includes automated exposure control, adjustment of the mA and/or kV according to patient size and/or use of iterative reconstruction technique. CONTRAST:  17mL OMNIPAQUE IOHEXOL 350 MG/ML SOLN COMPARISON:  Chest radiographs 09/08/2021 and earlier. FINDINGS: Cardiovascular: Excellent contrast bolus timing in the pulmonary arterial tree. No focal filling defect identified in the pulmonary arteries to suggest acute pulmonary embolism. Cardiomegaly with small volume pericardial effusion. Contrast reflux into the hepatic IVC and hepatic veins which appear mildly dilated. No contrast in the  aorta. Calcified coronary artery atherosclerosis on series 5, images 66 and 67. Mediastinum/Nodes: Negative. No mediastinal mass or lymphadenopathy. Lungs/Pleura: Small layering right and trace layering left pleural effusions with simple fluid density. Major airways remain patent. There is generalized pulmonary ground-glass opacity with mild peribronchial and dependent platelike atelectasis in the right lung. Similar platelike atelectasis in the left lower lobe. Upper Abdomen: Aside from contrast reflux into the hepatic veins visible liver, spleen, adrenal glands, left kidney, and bowel in the upper abdomen appear negative. Musculoskeletal: No acute osseous abnormality identified. Review of the MIP images confirms the above findings. IMPRESSION: 1. No evidence of acute pulmonary embolism. 2. Cardiomegaly with small volume pericardial effusion and evidence  of right heart failure. 3. Generalized Pulmonary Edema suspected with a small right > left layering pleural effusions and platelike atelectasis. 4. Calcified Coronary Artery Atherosclerosis. Electronically Signed   By: Odessa Fleming M.D.   On: 09/09/2021 07:15   ECHOCARDIOGRAM COMPLETE  Result Date: 09/09/2021    ECHOCARDIOGRAM REPORT   Patient Name:   AMOR HYLE Date of Exam: 09/09/2021 Medical Rec #:  [id number]      Height:       64.0 in Accession #:    [id number]     Weight:       135.0 lb Date of Birth:  Nov 27, 1960      BSA:          1.655 m Patient Age:    60 years       BP:           152/101 mmHg Patient Gender: F              HR:           103 bpm. Exam Location:  Inpatient Procedure: 2D Echo, Cardiac Doppler and Color Doppler Indications:    Elevated troponin, dyspnea  History:        Patient has no prior history of Echocardiogram examinations.  Sonographer:    Cleatis Polka Referring Phys: [id number] ANASTASSIA DOUTOVA IMPRESSIONS  1. Left ventricular ejection fraction, by estimation, is 55 to 60%. The left ventricle has normal function. The left ventricle has no regional wall motion abnormalities. There is mild left ventricular hypertrophy. Left ventricular diastolic parameters are consistent with Grade II diastolic dysfunction (pseudonormalization). Elevated left atrial pressure.  2. Right ventricular systolic function is normal. The right ventricular size is mildly enlarged.  3. Left atrial size was severely dilated.  4. Right atrial size was severely dilated.  5. A small pericardial effusion is present.  6. Mild mitral valve regurgitation.  7. The aortic valve is tricuspid. Aortic valve regurgitation is not visualized. Aortic valve sclerosis is present, with no evidence of aortic valve stenosis.  8. The inferior vena cava is dilated in size with <50% respiratory variability, suggesting right atrial pressure of 15 mmHg. FINDINGS  Left Ventricle: Left ventricular ejection fraction, by estimation, is 55 to 60%.  The left ventricle has normal function. The left ventricle has no regional wall motion abnormalities. The left ventricular internal cavity size was normal in size. There is  mild left ventricular hypertrophy. Left ventricular diastolic parameters are consistent with Grade II diastolic dysfunction (pseudonormalization). Elevated left atrial pressure. Right Ventricle: The right ventricular size is mildly enlarged. Right vetricular wall thickness was not assessed. Right ventricular systolic function is normal. Left Atrium: Left atrial size was severely dilated. Right Atrium: Right atrial size was severely dilated. Pericardium: A small pericardial effusion is present. Mitral Valve:  There is mild thickening of the mitral valve leaflet(s). Mild mitral valve regurgitation. Tricuspid Valve: The tricuspid valve is normal in structure. Tricuspid valve regurgitation is mild. Aortic Valve: The aortic valve is tricuspid. Aortic valve regurgitation is not visualized. Aortic valve sclerosis is present, with no evidence of aortic valve stenosis. Aortic valve peak gradient measures 13.4 mmHg. Pulmonic Valve: The pulmonic valve was normal in structure. Pulmonic valve regurgitation is not visualized. Aorta: The aortic root and ascending aorta are structurally normal, with no evidence of dilitation. Venous: The inferior vena cava is dilated in size with less than 50% respiratory variability, suggesting right atrial pressure of 15 mmHg. IAS/Shunts: No atrial level shunt detected by color flow Doppler.  LEFT VENTRICLE PLAX 2D LVIDd:         4.20 cm      Diastology LVIDs:         3.00 cm      LV e' medial:    6.31 cm/s LV PW:         1.40 cm      LV E/e' medial:  15.6 LV IVS:        1.10 cm      LV e' lateral:   6.09 cm/s LVOT diam:     2.00 cm      LV E/e' lateral: 16.2 LV SV:         66 LV SV Index:   40 LVOT Area:     3.14 cm  LV Volumes (MOD) LV vol d, MOD A2C: 112.0 ml LV vol d, MOD A4C: 99.2 ml LV vol s, MOD A2C: 47.5 ml LV vol s,  MOD A4C: 40.9 ml LV SV MOD A2C:     64.5 ml LV SV MOD A4C:     99.2 ml LV SV MOD BP:      62.4 ml RIGHT VENTRICLE             IVC RV Basal diam:  4.00 cm     IVC diam: 2.00 cm RV Mid diam:    3.20 cm RV S prime:     11.00 cm/s TAPSE (M-mode): 1.8 cm LEFT ATRIUM             Index        RIGHT ATRIUM           Index LA diam:        4.10 cm 2.48 cm/m   RA Area:     24.90 cm LA Vol (A2C):   80.6 ml 48.69 ml/m  RA Volume:   87.80 ml  53.04 ml/m LA Vol (A4C):   89.9 ml 54.31 ml/m LA Biplane Vol: 87.3 ml 52.74 ml/m  AORTIC VALVE AV Area (Vmax): 2.88 cm AV Vmax:        183.00 cm/s AV Peak Grad:   13.4 mmHg LVOT Vmax:      168.00 cm/s LVOT Vmean:     117.000 cm/s LVOT VTI:       0.209 m  AORTA Ao Root diam: 3.00 cm Ao Asc diam:  2.90 cm MITRAL VALVE               TRICUSPID VALVE MV Area (PHT): 4.36 cm    TR Peak grad:   41.5 mmHg MV Decel Time: 174 msec    TR Vmax:        322.00 cm/s MV E velocity: 98.50 cm/s MV A velocity: 71.70 cm/s  SHUNTS MV E/A ratio:  1.37  Systemic VTI:  0.21 m                            Systemic Diam: 2.00 cm Dietrich Pates MD Electronically signed by Dietrich Pates MD Signature Date/Time: 09/09/2021/10:40:46 AM    Final     Scheduled Meds:  amLODipine  10 mg Oral Daily   enoxaparin (LOVENOX) injection  30 mg Subcutaneous Q24H   furosemide  40 mg Intravenous Daily   lisinopril  10 mg Oral Daily   nicotine  21 mg Transdermal Daily   sodium chloride flush  3 mL Intravenous Q12H   Continuous Infusions:  sodium chloride     nitroGLYCERIN 100 mcg/min (09/09/21 1030)     LOS: 1 day    Time spent: 50 mins    Eden Rho, MD [facility]   If 7PM-7AM, please contact night-coverage

## 2021-09-09 NOTE — Consult Note (Addendum)
Cardiology Consultation:   Patient ID: MYRTH DAHAN MRN: [id number]; DOB: 09-13-1960  Admit date: 09/08/2021 Date of Consult: 09/09/2021  PCP:  Merryl Hacker No   [facility] Providers Cardiologist:  New (Dr. Angelena Form)  Patient Profile:   Amanda [name] is a 61 y.o. female with a history of hypertension and polysubstance abuse (tobacco, alcohol, marijuana, and cocaine) who is being seen for evaluation of chest pain and elevated troponin at the request of Dr. Dwyane Dee.  History of Present Illness:   Amanda [name] is a 61 year old female with the above history. She has a history of hypertension but is not on any medications for this. She states she was diagnosed with this several years ago. She has no known cardiac disease or known hyperlipidemia or diabetes but does not regularly see a medical provider.   She presented to the ED on 09/08/2021 for further evaluation of shortness of breath. Patient reports worsening dyspnea over the past 3 weeks. She describes dyspnea on exertion as well as PND and possible orthopnea. She also describes feeling very short of breath when angry and arguing with her boyfriend. She ultimately came to the ED because she states she "couldn't hardly breathe." She reports some abdominal bloating but denies any lower extremity edema. She denies any chest pain. She does reports some abdominal pain but denies any early satiety, nausea, vomiting, or diarrhea. No palpitations. She reports some mild lightheadedness/dizziness when acutely short of breath but no syncope. No recent fevers or illnesses. No cough or nasal congestion. No abnormal bleeding in urine or stools.  Upon arrival to the ED, patient markedly hypertensive with BP of as high as 221/151, and tachypneic with mild to moderate respiratory distress. EKG showed sinus tachycardia with slight ST depression in inferolateral leads and T wave inversions in lead aVL (no significant changes compared to prior tracing in 2017).  High-sensitivity troponin minimally elevated at 95 >> 104 >> 125. BNP elevated at 1,230. Chest x-ray showed mild to moderate interstitial thickening within the lung bases concerning for pulmonary edema as well as chronic lucent emphysematous changes and interstitial thickening which could represent progressive interstitial lung disease. D-dimer elevated at 1.26. Chest CTA showed no evidence of PE but did showed cardiomegaly with suspected generalized pulmonary edema with small right > left layering pleural effusions and platelike atelectasis, small pericardial effusion, and evidence of right heart failure. Also showed calcified coronary atherosclerosis. WBC 7.0, Hgb 14.8, Plts 320. Na 139, K 4.3, Glucose 127, BUN 19, Cr 1.14. Albumin 3.2, AST 83, ALT 118, Alk Phos 252, Total Bili 0.7. Venous blood gas showed pH of 7.508, pCO2 31.2, pO2 145, and Bicarb 24.7. Given concern for pulmonary edema, she was started on BiPAP and a Nitro drip for hypertensive urgency. She was also given IV Lasix. She was admitted to medicine service and Cardiology was consulted for further evaluation.  At the time of this evaluation, patient resting comfortably. She is no longer of BiPAP and is now on nasal cannula. She is still have noticeable short of breath when talking and moving around in bed. She denies any significant improvement in her breathing so far.  Patient has a history of polysubstance abuse. She has a long history of tobacco abuse and states she has smoked since she was a teenager. She currently smokes 5-6 cigarettes per day. She also reports marijuana and cocaine use but states she does not use often. She reports last using cocaine 1 week ago. UDS positive in the ED positive  for cocaine. She also has a history of alcohol abuse but states she does not drink often now.  Past Medical History:  Diagnosis Date   Hypertension     Past Surgical History:  Procedure Laterality Date   I & D EXTREMITY Right 01/21/2014    Procedure: IRRIGATION AND DEBRIDEMENT RIGHT INDEX FINGER;  Surgeon: Leanora Cover, MD;  Location: [facility];  Service: Orthopedics;  Laterality: Right;     Home Medications:  Prior to Admission medications   Medication Sig Start Date End Date Taking? Authorizing Provider  acetaminophen (TYLENOL) 500 MG tablet Take 1,000 mg by mouth every 6 (six) hours as needed for moderate pain or headache.   Yes [provider]  Naproxen Sod-diphenhydrAMINE (ALEVE PM PO) Take 2 tablets by mouth at bedtime as needed (sleep).   Yes [provider]    Inpatient Medications: Scheduled Meds:  amLODipine  10 mg Oral Daily   enoxaparin (LOVENOX) injection  30 mg Subcutaneous Q24H   furosemide  40 mg Intravenous Daily   lisinopril  10 mg Oral Daily   nicotine  21 mg Transdermal Daily   sodium chloride flush  3 mL Intravenous Q12H   Continuous Infusions:  sodium chloride     nitroGLYCERIN 100 mcg/min (09/09/21 1328)   PRN Meds: sodium chloride, acetaminophen **OR** acetaminophen, hydrALAZINE, HYDROcodone-acetaminophen, nitroGLYCERIN, sodium chloride flush  Allergies:   No Known Allergies  Social History:   Social History   Socioeconomic History   Marital status: Single    Spouse name: Not on file   Number of children: Not on file   Years of education: Not on file   Highest education level: Not on file  Occupational History   Not on file  Tobacco Use   Smoking status: Some Days    Types: Cigarettes   Smokeless tobacco: Never  Substance and Sexual Activity   Alcohol use: Yes    Comment: 3 times a week   Drug use: No   Sexual activity: Yes  Other Topics Concern   Not on file  Social History Narrative   Not on file   Social Determinants of Health   Financial Resource Strain: Not on file  Food Insecurity: Not on file  Transportation Needs: Not on file  Physical Activity: Not on file  Stress: Not on file  Social Connections: Not on file  Intimate Partner Violence: Not on  file    Family History:    Family History  Problem Relation Age of Onset   Heart disease Mother        had a pacemaker    ROS:  Please see the history of present illness.  Review of Systems  Constitutional:  Negative for fever.  HENT:  Negative for congestion.   Respiratory:  Positive for shortness of breath. Negative for cough.   Cardiovascular:  Positive for orthopnea and PND. Negative for chest pain, palpitations and leg swelling.  Gastrointestinal:  Positive for abdominal pain. Negative for blood in stool, melena, nausea and vomiting.  Genitourinary:  Negative for hematuria.  Musculoskeletal:  Negative for myalgias.  Neurological:  Negative for dizziness and loss of consciousness.  Endo/Heme/Allergies:  Does not bruise/bleed easily.  Psychiatric/Behavioral:  Positive for substance abuse.    Physical Exam/Data:   Vitals:   09/09/21 1215 09/09/21 1230 09/09/21 1315 09/09/21 1330  BP: (!) 142/81 (!) 164/95 (!) 176/118 (!) 145/84  Pulse: 90 94 (!) 101 (!) 104  Resp: 18 19 (!) 25 (!) 24  Temp:  TempSrc:      SpO2: 99% 100% 99% 98%  Weight:      Height:        Intake/Output Summary (Last 24 hours) at 09/09/2021 1405 Last data filed at 09/09/2021 1134 Gross per 24 hour  Intake 528.52 ml  Output 1750 ml  Net -1221.48 ml      09/08/2021    5:29 PM 03/22/2021   10:53 AM 06/17/2015    8:27 AM  Last 3 Weights  Weight (lbs) 135 lb 135 lb 140 lb  Weight (kg) 61.236 kg 61.236 kg 63.504 kg     Body mass index is 23.17 kg/m.  General: 61 y.o. African-American female resting comfortably in no acute distress. HEENT: Normocephalic and atraumatic. Sclera clear.  Neck: Supple. Distended external jugular vein. Heart: Tachycardia with normal rhythm. Distinct S1 and S2. No murmurs, gallops, or rubs. Radial pulses 2+ and equal bilaterally. Lungs: Mild increased work of breathing. Mild crackles in bilateral bases. No wheezes or rhonchi. Abdomen: Soft, distended, and non-tender to  palpation. Bowel sounds present. Extremities: No lower extremity edema.    Skin: Warm and dry. Neuro: Alert and oriented x3. No focal deficits. Psych: Normal affect. Responds appropriately.   EKG:  The EKG was personally reviewed and demonstrates:   - Initial EKG on 09/08/2021: Sinus tachycardia, 93 bpm, with slight ST depression in inferior lateral leads and T wave inversion in lead aVL. - Repeat EKG on 09/09/2021: Normal sinus rhythm, rate 93 bpm, with probable LVH, T wave inversions in lead V2, and abnormal T wave inversions in lateral leads.  QTc 496 ms.  Telemetry:  Telemetry was personally reviewed and demonstrates:  Sinus rhythm with rates in the 80s to low 100s.   Relevant CV Studies:  Echocardiogram 09/09/2021:  Impressions: 1. Left ventricular ejection fraction, by estimation, is 55 to 60%. The  left ventricle has normal function. The left ventricle has no regional  wall motion abnormalities. There is mild left ventricular hypertrophy.  Left ventricular diastolic parameters  are consistent with Grade II diastolic dysfunction (pseudonormalization).  Elevated left atrial pressure.   2. Right ventricular systolic function is normal. The right ventricular  size is mildly enlarged.   3. Left atrial size was severely dilated.   4. Right atrial size was severely dilated.   5. A small pericardial effusion is present.   6. Mild mitral valve regurgitation.   7. The aortic valve is tricuspid. Aortic valve regurgitation is not  visualized. Aortic valve sclerosis is present, with no evidence of aortic  valve stenosis.   8. The inferior vena cava is dilated in size with <50% respiratory  variability, suggesting right atrial pressure of 15 mmHg.   Laboratory Data:  High Sensitivity Troponin:   Recent Labs  Lab 09/08/21 2133 09/09/21 0102 09/09/21 0340  TROPONINIHS 95* 104* 125*     Chemistry Recent Labs  Lab 09/08/21 2133 09/08/21 2159 09/09/21 0102 09/09/21 0340  NA 139  138  --  136  K 4.3 4.1  --  3.6  CL 106  --   --  102  CO2 23  --   --  26  GLUCOSE 127*  --   --  105*  BUN 19  --   --  16  CREATININE 1.14*  --   --  1.16*  CALCIUM 9.0  --   --  8.5*  MG  --   --  1.8 1.7  GFRNONAA 55*  --   --  54*  ANIONGAP 10  --   --  8    Recent Labs  Lab 09/08/21 2133 09/09/21 0102 09/09/21 0340  PROT 6.9 5.9* 5.7*  ALBUMIN 3.2* 2.7* 2.7*  AST 83* 68* 65*  ALT 118* 95* 90*  ALKPHOS 252* 220* 195*  BILITOT 0.7 0.5 0.9   Lipids No results for input(s): CHOL, TRIG, HDL, LABVLDL, LDLCALC, CHOLHDL in the last 168 hours.  Hematology Recent Labs  Lab 09/08/21 2133 09/08/21 2159 09/09/21 0340  WBC 7.0  --  7.1  RBC 5.05  --  4.36  HGB 14.8 16.0* 13.0  HCT 45.7 47.0* 39.5  MCV 90.5  --  90.6  MCH 29.3  --  29.8  MCHC 32.4  --  32.9  RDW 15.0  --  14.8  PLT 320  --  261   Thyroid No results for input(s): TSH, FREET4 in the last 168 hours.  BNP Recent Labs  Lab 09/08/21 2133  BNP 1,230.1*    DDimer  Recent Labs  Lab 09/09/21 0102  DDIMER 1.26*     Radiology/Studies:  DG Chest 2 View  Result Date: 09/08/2021 CLINICAL DATA:  Shortness of breath for 3 weeks. EXAM: CHEST - 2 VIEW COMPARISON:  Chest two views 01/07/2016 and 07/24/2013 FINDINGS: Cardiac silhouette is moderately enlarged and this appears unchanged to mildly worsened from prior. Mediastinal contours are within normal limits. There is interval increase in now moderate inferior and mild mid height of the lungs interstitial pulmonary edema compared to 01/07/2016. This is already been increased on 01/07/2016 compared to normal 07/24/2013 radiographs. There is likely a new tiny right pleural effusion with possible tiny left pleural effusion on lateral view. There again appears to be increased cystic lucency within the upper lungs suggesting cystic emphysematous changes. No pneumothorax is seen. No acute skeletal abnormality. IMPRESSION: 1. There is mild-to-moderate interstitial  thickening greatest within the lung bases. This is increased from 01/07/2016, which was already increased from normal radiographs 07/24/2013. This may represent progressive mild-to-moderate interstitial pulmonary edema. There also appear to be chronic lucent emphysematous changes and interstitial thickening could represent progressive interstitial lung disease. 2. Probable trace right and possible trace left pleural effusions. Electronically Signed   By: Yvonne Kendall M.D.   On: 09/08/2021 18:12   DG Abd 1 View  Result Date: 09/09/2021 CLINICAL DATA:  Abdominal distension EXAM: ABDOMEN - 1 VIEW COMPARISON:  None Available. FINDINGS: The bowel gas pattern is normal. No radio-opaque calculi or other significant radiographic abnormality are seen. IMPRESSION: Negative. Electronically Signed   By: Rolm Baptise M.D.   On: 09/09/2021 00:10   CT Angio Chest Pulmonary Embolism (PE) W or WO Contrast  Result Date: 09/09/2021 CLINICAL DATA:  61 year old female with shortness of breath and abdominal distension. EXAM: CT ANGIOGRAPHY CHEST WITH CONTRAST TECHNIQUE: Multidetector CT imaging of the chest was performed using the standard protocol during bolus administration of intravenous contrast. Multiplanar CT image reconstructions and MIPs were obtained to evaluate the vascular anatomy. RADIATION DOSE REDUCTION: This exam was performed according to the departmental dose-optimization program which includes automated exposure control, adjustment of the mA and/or kV according to patient size and/or use of iterative reconstruction technique. CONTRAST:  61m OMNIPAQUE IOHEXOL 350 MG/ML SOLN COMPARISON:  Chest radiographs 09/08/2021 and earlier. FINDINGS: Cardiovascular: Excellent contrast bolus timing in the pulmonary arterial tree. No focal filling defect identified in the pulmonary arteries to suggest acute pulmonary embolism. Cardiomegaly with small volume pericardial effusion. Contrast reflux into the hepatic IVC and  hepatic  veins which appear mildly dilated. No contrast in the aorta. Calcified coronary artery atherosclerosis on series 5, images 66 and 67. Mediastinum/Nodes: Negative. No mediastinal mass or lymphadenopathy. Lungs/Pleura: Small layering right and trace layering left pleural effusions with simple fluid density. Major airways remain patent. There is generalized pulmonary ground-glass opacity with mild peribronchial and dependent platelike atelectasis in the right lung. Similar platelike atelectasis in the left lower lobe. Upper Abdomen: Aside from contrast reflux into the hepatic veins visible liver, spleen, adrenal glands, left kidney, and bowel in the upper abdomen appear negative. Musculoskeletal: No acute osseous abnormality identified. Review of the MIP images confirms the above findings. IMPRESSION: 1. No evidence of acute pulmonary embolism. 2. Cardiomegaly with small volume pericardial effusion and evidence of right heart failure. 3. Generalized Pulmonary Edema suspected with a small right > left layering pleural effusions and platelike atelectasis. 4. Calcified Coronary Artery Atherosclerosis. Electronically Signed   By: Genevie Ann M.D.   On: 09/09/2021 07:15   ECHOCARDIOGRAM COMPLETE  Result Date: 09/09/2021    ECHOCARDIOGRAM REPORT   Patient Name:   Amanda [name] Date of Exam: 09/09/2021 Medical Rec #:  [id number]      Height:       64.0 in Accession #:    [id number]     Weight:       135.0 lb Date of Birth:  [birth year]      BSA:          1.655 m Patient Age:    13 years       BP:           152/101 mmHg Patient Gender: F              HR:           103 bpm. Exam Location:  Inpatient Procedure: 2D Echo, Cardiac Doppler and Color Doppler Indications:    Elevated troponin, dyspnea  History:        Patient has no prior history of Echocardiogram examinations.  Sonographer:    Jyl Heinz Referring Phys: [place]  1. Left ventricular ejection fraction, by estimation, is 55 to 60%.  The left ventricle has normal function. The left ventricle has no regional wall motion abnormalities. There is mild left ventricular hypertrophy. Left ventricular diastolic parameters are consistent with Grade II diastolic dysfunction (pseudonormalization). Elevated left atrial pressure.  2. Right ventricular systolic function is normal. The right ventricular size is mildly enlarged.  3. Left atrial size was severely dilated.  4. Right atrial size was severely dilated.  5. A small pericardial effusion is present.  6. Mild mitral valve regurgitation.  7. The aortic valve is tricuspid. Aortic valve regurgitation is not visualized. Aortic valve sclerosis is present, with no evidence of aortic valve stenosis.  8. The inferior vena cava is dilated in size with <50% respiratory variability, suggesting right atrial pressure of 15 mmHg. FINDINGS  Left Ventricle: Left ventricular ejection fraction, by estimation, is 55 to 60%. The left ventricle has normal function. The left ventricle has no regional wall motion abnormalities. The left ventricular internal cavity size was normal in size. There is  mild left ventricular hypertrophy. Left ventricular diastolic parameters are consistent with Grade II diastolic dysfunction (pseudonormalization). Elevated left atrial pressure. Right Ventricle: The right ventricular size is mildly enlarged. Right vetricular wall thickness was not assessed. Right ventricular systolic function is normal. Left Atrium: Left atrial size was severely dilated. Right Atrium: Right atrial size was severely dilated. Pericardium:  A small pericardial effusion is present. Mitral Valve: There is mild thickening of the mitral valve leaflet(s). Mild mitral valve regurgitation. Tricuspid Valve: The tricuspid valve is normal in structure. Tricuspid valve regurgitation is mild. Aortic Valve: The aortic valve is tricuspid. Aortic valve regurgitation is not visualized. Aortic valve sclerosis is present, with no  evidence of aortic valve stenosis. Aortic valve peak gradient measures 13.4 mmHg. Pulmonic Valve: The pulmonic valve was normal in structure. Pulmonic valve regurgitation is not visualized. Aorta: The aortic root and ascending aorta are structurally normal, with no evidence of dilitation. Venous: The inferior vena cava is dilated in size with less than 50% respiratory variability, suggesting right atrial pressure of 15 mmHg. IAS/Shunts: No atrial level shunt detected by color flow Doppler.  LEFT VENTRICLE PLAX 2D LVIDd:         4.20 cm      Diastology LVIDs:         3.00 cm      LV e' medial:    6.31 cm/s LV PW:         1.40 cm      LV E/e' medial:  15.6 LV IVS:        1.10 cm      LV e' lateral:   6.09 cm/s LVOT diam:     2.00 cm      LV E/e' lateral: 16.2 LV SV:         66 LV SV Index:   40 LVOT Area:     3.14 cm  LV Volumes (MOD) LV vol d, MOD A2C: 112.0 ml LV vol d, MOD A4C: 99.2 ml LV vol s, MOD A2C: 47.5 ml LV vol s, MOD A4C: 40.9 ml LV SV MOD A2C:     64.5 ml LV SV MOD A4C:     99.2 ml LV SV MOD BP:      62.4 ml RIGHT VENTRICLE             IVC RV Basal diam:  4.00 cm     IVC diam: 2.00 cm RV Mid diam:    3.20 cm RV S prime:     11.00 cm/s TAPSE (M-mode): 1.8 cm LEFT ATRIUM             Index        RIGHT ATRIUM           Index LA diam:        4.10 cm 2.48 cm/m   RA Area:     24.90 cm LA Vol (A2C):   80.6 ml 48.69 ml/m  RA Volume:   87.80 ml  53.04 ml/m LA Vol (A4C):   89.9 ml 54.31 ml/m LA Biplane Vol: 87.3 ml 52.74 ml/m  AORTIC VALVE AV Area (Vmax): 2.88 cm AV Vmax:        183.00 cm/s AV Peak Grad:   13.4 mmHg LVOT Vmax:      168.00 cm/s LVOT Vmean:     117.000 cm/s LVOT VTI:       0.209 m  AORTA Ao Root diam: 3.00 cm Ao Asc diam:  2.90 cm MITRAL VALVE               TRICUSPID VALVE MV Area (PHT): 4.36 cm    TR Peak grad:   41.5 mmHg MV Decel Time: 174 msec    TR Vmax:        322.00 cm/s MV E velocity: 98.50 cm/s MV A velocity: 71.70 cm/s  SHUNTS MV  E/A ratio:  1.37        Systemic VTI:  0.21 m                             Systemic Diam: 2.00 cm Dorris Carnes MD Electronically signed by Dorris Carnes MD Signature Date/Time: 09/09/2021/10:40:46 AM    Final      Assessment and Plan:   Acute Diastolic CHF Patient presented with progressive shortness of breath. She was tachyneic with respiratory distress on arrival to the ED and initially required BiPAP. BNP elevated at 1,230. Chest x-ray and CTA concerning for pulmonary edema. Echo showed LVEF of 55-60% with normal wall motion, mild LVH, and grade 2 diastolic dysfunction as well as mildly enlarged RV with normal systolic function, severe biatrial enlargement, mild MR, and small pericardial effusion. She has been started on IV Lasix with good response.  - Volume overloaded on exam. - Continue IV Lasix 107m daily. - Suspect this is due to hypertensive emergency. Will continue to treat hypertension as below.  Elevated Troponin High-sensitivity troponin minimally elevated and flat at 95 >> 104 >> 125. EKG shows some ST/T wave abnormalities in inferolateral leads. Chest CTA showed calcified coronary artery atherosclerosis. Echo showed normal LV function and no regional wall motion abnormalities. - No chest pain. - Suspect troponin elevation is due to demand ischemic from acute CHF and hypertensive emergency. Do not think any ischemic evaluation is necessary at this time given normal Echo. Will add Aspirin 848mdaily given coronary calcifications. Will check lipid panel and then likely add a statin. Will also check hemoglobin A1c.  Hypertensive Emergency BP as high as 234/174 in the ED. Started on IV Nitroglycerin and PO Amlodipine and Lisinopril with improvement. - Continue Amlodipine 1024maily. - Continue Lisinopril 32m24mily. - Wean IV Nitro as able and can then continue to adjust PO medications. If additional agents is needed, would add Spironolactone. - Consider renal artery ultrasound.  Elevated LFTs AST, ALT, and Alk Phos mildly elevated. Suspect  due to hepatic congestion from CHF but also has a history of alcohol abuse. Abdominal x-ray unremarkable. - Continue to monitor. - Management per primary team.  Polysubstance Abuse Patient has a history of tobacco, alcohol, marijuana, and cocaine. Urine drug screen positive for cocaine. - Discussed importance of complete cessation. - Management per primary team.   Risk Assessment/Risk Scores:    New York Heart Association (NYHA) Functional Class NYHA Class IV  For questions or updates, please contact [email or web address] Please consult [email or web address] for contact info under    Signed, CallDarreld Mclean-C  09/09/2021 2:05 PM  I have personally seen and examined this patient. I agree with the assessment and plan as outlined above.  60 y58female with history of HTN, tobacco abuse, prior etoh abuse, cocaine abuse admitted with dyspnea and found to have a hypertensive emergency. She was felt to be volume overloaded. Chest CT with evidence of pulmonary edema. No PE. Evidence of coronary artery calcification. She was placed on bipap and her pressure was slowly brought down with IV NTG. Echo with normal LV systolic function.  She has been diuresed with IV Lasix. She is feeling much better with diuresis.  Troponin is mildly abnormal with flat trend. This is likely due to demand ischemia in setting of hypertensive emergency.  My exam: Crackles in the bases. CV:RRR, no murmurs. Ext: No LE edema Plan: Acute diastolic CHF in the setting of hypertensive urgency:  Agree with continued diuresis with IV lasix and titration of anti-hypertensive therapy. I do not think an ischemic workup is indicated at this time.   Lauree Chandler, MD, [facility] 09/09/2021 2:52 PM

## 2021-09-09 NOTE — ED Notes (Addendum)
Pt requested to take a break from Bipap to eat. Toniann Fail MD made aware. Bipap removed, pt given [place] sandwich and ginger ale. Spo2 97% RA

## 2021-09-09 NOTE — Progress Notes (Signed)
Heart Failure Stewardship Pharmacist Progress Note   PCP: Pcp, No PCP-Cardiologist: None    HPI:  61 yo AAF with PMH significant for HTN, polysubstance use (occasional cocaine, tobacco - 1 PPD) who presented to [facility] ED with significant orthopnea, dyspnea and cough over the past three weeks.  She reported medication non-adherence for years due to cost.  On arrival, she was found to have acute severe HTN with BP of 221/151 and dyspnea requiring BiPAP and a NTG drip.  UDS was positive for cocaine.  BNP was elevated, abdomen was distended on exam and CXR in the ED showed mild-moderate interstitial pulmonary edema.  She was given Lasix IV 20 mg x1 with no uop charted.   CTA on 05/30 showed cardiomegaly with small volume pericardial effusion, evidence of right-sided HF, generalized pulmonary edema and negative for PE.  Her Echo on 05/30 revealed LVEF 55-60% with mild LVH, G2DD, normal RV function, severe biatrial dilation, and mild MR.  She was started on Lasix IV 40 mg daily with good response thus far and transitioned to 3L [place].    Current HF Medications: Diuretic: furosemide 40 mg IV daily ACE/ARB/ARNI: lisinopril 10 mg daily  Prior to admission HF Medications: None  Pertinent Lab Values: Serum creatinine 1.16, BUN 16, Potassium 3.6, Sodium 136, BNP 1230, Magnesium 1.7, A1c none on file  Vital Signs: Weight: none today (admission weight: 135 lbs) Blood pressure: 170-80/100-25 - 221/151 on admit  Heart rate: 80-90s  I/O: -1.75 L so far today; net -1.22L  Medication Assistance / Insurance Benefits Check: Does the patient have prescription insurance?  Pending Type of insurance plan: none on file   Outpatient Pharmacy:  Prior to admission outpatient pharmacy: none Is the patient willing to use [facility] TOC pharmacy at discharge? Pending Is the patient willing to transition their outpatient pharmacy to utilize a [facility] outpatient pharmacy?   Pending    Assessment: 1. Acute on diastolic  CHF (LVEF 55-60%) with G2DD, due to cocaine use. NYHA class IV symptoms. - Continue furosemide IV 40 mg daily - good response with 1.75L uop. Still requiring BiPAP. - No beta-blocker with ongoing cocaine use - Currently on lisinopril 10 mg daily   Plan: 1) Medication changes recommended at this time: - Switch lisinopril 10 mg to losartan 25 mg daily to eventually switch to Entresto 24-26 mg BID - Give potassium 40 mEq x1 to maintain K > 4 - Give magnesium IV 2g x1 to keep Mg > 2  2) Patient assistance: - none pending currently  3)  Education  - To be completed prior to discharge  Filbert Schilder, PharmD PGY1 Pharmacy Resident 09/09/2021  7:55 AM

## 2021-09-09 NOTE — ED Notes (Signed)
Pt in xray

## 2021-09-10 ENCOUNTER — Other Ambulatory Visit (HOSPITAL_COMMUNITY): Payer: Self-pay

## 2021-09-10 ENCOUNTER — Encounter (HOSPITAL_COMMUNITY): Payer: Self-pay | Admitting: Internal Medicine

## 2021-09-10 LAB — PHOSPHORUS: Phosphorus: 3.2 mg/dL (ref 2.5–4.6)

## 2021-09-10 LAB — COMPREHENSIVE METABOLIC PANEL
ALT: 73 U/L — ABNORMAL HIGH (ref 0–44)
AST: 47 U/L — ABNORMAL HIGH (ref 15–41)
Albumin: 2.6 g/dL — ABNORMAL LOW (ref 3.5–5.0)
Alkaline Phosphatase: 180 U/L — ABNORMAL HIGH (ref 38–126)
Anion gap: 7 (ref 5–15)
BUN: 15 mg/dL (ref 6–20)
CO2: 27 mmol/L (ref 22–32)
Calcium: 8.7 mg/dL — ABNORMAL LOW (ref 8.9–10.3)
Chloride: 103 mmol/L (ref 98–111)
Creatinine, Ser: 1.1 mg/dL — ABNORMAL HIGH (ref 0.44–1.00)
GFR, Estimated: 58 mL/min — ABNORMAL LOW (ref >60)
Glucose, Bld: 161 mg/dL — ABNORMAL HIGH (ref 70–99)
Potassium: 3.7 mmol/L (ref 3.5–5.1)
Sodium: 137 mmol/L (ref 135–145)
Total Bilirubin: 0.5 mg/dL (ref 0.3–1.2)
Total Protein: 5.9 g/dL — ABNORMAL LOW (ref 6.5–8.1)

## 2021-09-10 LAB — CBC
HCT: 41.1 % (ref 36.0–46.0)
Hemoglobin: 13.6 g/dL (ref 12.0–15.0)
MCH: 29.6 pg (ref 26.0–34.0)
MCHC: 33.1 g/dL (ref 30.0–36.0)
MCV: 89.5 fL (ref 80.0–100.0)
Platelets: 278 10*3/uL (ref 150–400)
RBC: 4.59 MIL/uL (ref 3.87–5.11)
RDW: 14.9 % (ref 11.5–15.5)
WBC: 8.4 10*3/uL (ref 4.0–10.5)
nRBC: 0 % (ref 0.0–0.2)

## 2021-09-10 LAB — HEMOGLOBIN A1C
Hgb A1c MFr Bld: 6.8 % — ABNORMAL HIGH (ref 4.8–5.6)
Mean Plasma Glucose: 148.46 mg/dL

## 2021-09-10 LAB — MAGNESIUM: Magnesium: 2.2 mg/dL (ref 1.7–2.4)

## 2021-09-10 MED ORDER — EMPAGLIFLOZIN 10 MG PO TABS: 10.0000 mg | ORAL_TABLET | Freq: Every day | ORAL | 1 refills | Status: DC

## 2021-09-10 MED ORDER — LISINOPRIL 20 MG PO TABS
20.0000 mg | ORAL_TABLET | Freq: Every day | ORAL | Status: DC
  Administered 2021-09-10: 20 mg via ORAL
  Filled 2021-09-10: qty 1

## 2021-09-10 MED ORDER — LOSARTAN POTASSIUM 50 MG PO TABS
50.0000 mg | ORAL_TABLET | Freq: Every day | ORAL | 3 refills | Status: DC
  Filled 2021-09-10 – 2021-10-23 (×2): qty 30, 30d supply, fill #0
  Filled 2021-10-23: qty 30, 30d supply, fill #1
  Filled 2021-10-31: qty 30, 30d supply, fill #0

## 2021-09-10 MED ORDER — LISINOPRIL 20 MG PO TABS: 20.0000 mg | ORAL_TABLET | Freq: Every day | ORAL | 1 refills | Status: DC

## 2021-09-10 MED ORDER — AMLODIPINE BESYLATE 10 MG PO TABS: 10.0000 mg | ORAL_TABLET | Freq: Every day | ORAL | 1 refills | Status: DC

## 2021-09-10 MED ORDER — AMLODIPINE BESYLATE 10 MG PO TABS
10.0000 mg | ORAL_TABLET | Freq: Every day | ORAL | 1 refills | Status: DC
  Filled 2021-09-10 – 2021-10-23 (×2): qty 30, 30d supply, fill #0
  Filled 2021-10-23: qty 30, 30d supply, fill #1
  Filled 2021-10-31: qty 30, 30d supply, fill #0

## 2021-09-10 MED ORDER — EMPAGLIFLOZIN 10 MG PO TABS
10.0000 mg | ORAL_TABLET | Freq: Every day | ORAL | Status: DC
  Administered 2021-09-10: 10 mg via ORAL
  Filled 2021-09-10: qty 1

## 2021-09-10 MED ORDER — NITROGLYCERIN 0.4 MG SL SUBL
0.4000 mg | SUBLINGUAL_TABLET | SUBLINGUAL | 1 refills | Status: DC | PRN
  Filled 2021-09-10 – 2021-10-23 (×2): qty 25, 14d supply, fill #0
  Filled 2021-10-23: qty 25, 14d supply, fill #1
  Filled 2021-10-31 – 2021-11-04 (×2): qty 25, 14d supply, fill #0

## 2021-09-10 MED ORDER — EMPAGLIFLOZIN 10 MG PO TABS
10.0000 mg | ORAL_TABLET | Freq: Every day | ORAL | 1 refills | Status: DC
  Filled 2021-09-10: qty 30, 30d supply, fill #0
  Filled 2021-11-07 (×2): qty 30, 30d supply, fill #1

## 2021-09-10 MED ORDER — NITROGLYCERIN 0.4 MG SL SUBL: 0.4000 mg | SUBLINGUAL_TABLET | SUBLINGUAL | 1 refills | Status: DC | PRN

## 2021-09-10 NOTE — Progress Notes (Signed)
Heart Failure Nurse Navigator Progress Note  PCP: Pcp, No PCP-Cardiologist: none Admission Diagnosis: Hypertensive urgency, shortness of breath.  Admitted from: Home  Presentation:   Amanda [name] presented with shortness of breath x 3 weeks,chest pain,  cocaine +, states she hasn't taking her medication for years, tachycardia. BNP 1,230, Troponin 95, Given IV lasix and nitroglycerin, placed on Bi-pap, admitted.  Patient educated on the sign and symptoms of heart failure, daily weights, when to call her doctor or go to the ER. Stressed importance of diet/ fluid restrictions, especially salt and soda, and taking all medication as prescribed, educated on the importance of quitting doing cocaine. Patient voiced her understanding. Scheduled for hospital follow up on 09/23/21 @ 11 am.  ECHO/ LVEF: 55-60%  Clinical Course:  Past Medical History:  Diagnosis Date   Hypertension      Social History   Socioeconomic History   Marital status: Single    Spouse name: Not on file   Number of children: 4   Years of education: Not on file   Highest education level: High school graduate  Occupational History    Comment: Not working right now  Tobacco Use   Smoking status: Some Days    Types: Cigarettes   Smokeless tobacco: Never  Vaping Use   Vaping Use: Never used  Substance and Sexual Activity   Alcohol use: Yes    Comment: 3 times a week   Drug use: Yes    Types: "Crack" cocaine    Comment: 1 week ago   Sexual activity: Yes  Other Topics Concern   Not on file  Social History Narrative   Not on file   Social Determinants of Health   Financial Resource Strain: Medium Risk   Difficulty of Paying Living Expenses: Somewhat hard  Food Insecurity: Food Insecurity Present   Worried About Programme researcher, broadcasting/film/video in the Last Year: Sometimes true   Ran Out of Food in the Last Year: Never true  Transportation Needs: No Transportation Needs   Lack of Transportation (Medical): No   Lack of  Transportation (Non-Medical): No  Physical Activity: Not on file  Stress: Not on file  Social Connections: Not on file   Education Assessment and Provision:  Detailed education and instructions provided on heart failure disease management including the following:  Signs and symptoms of Heart Failure When to call the physician Importance of daily weights Low sodium diet Fluid restriction Medication management Anticipated future follow-up appointments  Patient education given on each of the above topics.  Patient acknowledges understanding via teach back method and acceptance of all instructions.  Education Materials:  "Living Better With Heart Failure" Booklet, HF zone tool, & Daily Weight Tracker Tool.  Patient has scale at home: yes Patient has pill box at home: NA    High Risk Criteria for Readmission and/or Poor Patient Outcomes: Heart failure hospital admissions (last 6 months): 0  No Show rate: 26 % Difficult social situation: polysubstance Hx Demonstrates medication adherence: No Primary Language: English Literacy level: Reading, writing, and comprehension  Barriers of Care:   Medication compliance Polysubstance history Diet/ fluid restrictions  Considerations/Referrals:   Referral made to Heart Failure Pharmacist Stewardship: yes Referral made to Heart Failure CSW/NCM TOC: no Referral made to Heart & Vascular TOC clinic: yes, 09/23/21 @ 11 am  Items for Follow-up on DC/TOC: Medication compliance Diet/ fluid restrictions Polysubstance cessation   Rhae Hammock, BSN, RN Heart Failure Teacher, adult education Only

## 2021-09-10 NOTE — TOC Progression Note (Signed)
Transition of Care [facility]) - Progression Note    Patient Details  Name: Amanda [name] MRN: [id number] Date of Birth: [birth year]  Transition of Care [facility]) CM/SW Contact  Beckie Busing, RN Phone Number:[phone]  09/10/2021, 12:53 PM  Clinical Narrative:    MATCH completed to provide uninsured patient with 30 day supply of meds. Meds to be delivered to bedside per [facility] pharmacy.         Expected Discharge Plan and Services           Expected Discharge Date: 09/10/21                                     Social Determinants of Health (SDOH) Interventions Food Insecurity Interventions: Intervention Not Indicated Financial Strain Interventions: Other (Comment) Housing Interventions: Intervention Not Indicated Transportation Interventions: Intervention Not Indicated  Readmission Risk Interventions     View : No data to display.

## 2021-09-10 NOTE — TOC Initial Note (Signed)
Transition of Care [facility]) - Initial/Assessment Note    Patient Details  Name: Amanda [name] MRN: [id number] Date of Birth: [birth year]  Transition of Care [facility]) CM/SW Contact:    Beckie Busing, RN Phone [phone]  09/10/2021, 2:28 PM  Clinical Narrative:                 [facility] consulted for heart failure home health screen. Patient states she has never been told that she has heart failure. Patient states that she does not follow a cardiologist. Patient states that she does not have heart failure. Patient does have history of cocaine and alcohol abuse bit only admits to alcohol on cage aid. Patient states that she is discharging home today. No needs noted at this time.   Expected Discharge Plan: Home/Self Care Barriers to Discharge: No Barriers Identified   Patient Goals and CMS Choice Patient states their goals for this hospitalization and ongoing recovery are:: Wants to go home CMS Medicare.gov Compare Post Acute Care list provided to::  (n/a) Choice offered to / list presented to : NA  Expected Discharge Plan and Services Expected Discharge Plan: Home/Self Care In-house Referral: NA Discharge Planning Services: CM Consult Post Acute Care Choice: NA Living arrangements for the past 2 months: Single Family Home Expected Discharge Date: 09/10/21               DME Arranged: N/A DME Agency: NA       HH Arranged: NA HH Agency: NA        Prior Living Arrangements/Services Living arrangements for the past 2 months: Single Family Home Lives with:: Self Patient language and need for interpreter reviewed:: Yes Do you feel safe going back to the place where you live?: Yes      Need for Family Participation in Patient Care: No (Comment) Care giver support system in place?: Yes (comment)   Criminal Activity/Legal Involvement Pertinent to Current Situation/Hospitalization: No - Comment as needed  Activities of Daily Living      Permission Sought/Granted                   Emotional Assessment Appearance:: Appears stated age Attitude/Demeanor/Rapport: Avoidant Affect (typically observed): Flat Orientation: : Oriented to Self, Oriented to Place, Oriented to  Time, Oriented to Situation Alcohol / Substance Use: Alcohol Use Psych Involvement: No (comment)  Admission diagnosis:  SOB (shortness of breath) [R06.02] Hypertensive urgency [I16.0] Hypertensive emergency [I16.1] Patient Active Problem List   Diagnosis Date Noted   Hypertensive emergency 09/08/2021   Elevated troponin 09/08/2021   Pulmonary edema 09/08/2021   Tobacco abuse 09/08/2021   Cocaine abuse (HCC) 09/08/2021   History of alcohol abuse 09/08/2021   Abnormal chest x-ray 09/08/2021   Acute respiratory distress 09/08/2021   PCP:  Pcp, No Pharmacy:   [facility] [phone] - [place], [place] - [address] AT [facility] [address] [place] [address] Phone: [phone] Fax: [phone]  [facility] [address] [place] [address] Phone: [phone] Fax: [phone]  [facility] [address] [place] [address] Phone: [phone] Fax: [phone]     Social Determinants of Health (SDOH) Interventions Food Insecurity Interventions: Intervention Not Indicated Financial Strain Interventions: Other (Comment) Housing Interventions: Intervention Not Indicated Transportation Interventions: Intervention Not Indicated  Readmission Risk Interventions     View : No data to display.

## 2021-09-10 NOTE — Progress Notes (Signed)
Progress Note  Patient Name: Amanda [name] Date of Encounter: 09/10/2021  [facility] Cardiologist: None   Subjective   No acute events overnight.  Denies chest pain or dyspnea.  Very sleepy today and wants to leave  Inpatient Medications    Scheduled Meds:  amLODipine  10 mg Oral Daily   empagliflozin  10 mg Oral Daily   enoxaparin (LOVENOX) injection  40 mg Subcutaneous Q24H   furosemide  40 mg Intravenous Daily   lisinopril  20 mg Oral Daily   nicotine  21 mg Transdermal Daily   sodium chloride flush  3 mL Intravenous Q12H   Continuous Infusions:  sodium chloride     nitroGLYCERIN 95 mcg/min (09/10/21 0749)   PRN Meds: sodium chloride, acetaminophen **OR** acetaminophen, hydrALAZINE, HYDROcodone-acetaminophen, nitroGLYCERIN, sodium chloride flush   Vital Signs    Vitals:   09/10/21 0430 09/10/21 0500 09/10/21 0600 09/10/21 0732  BP: 120/72 (!) 154/85 (!) 169/102 (!) 171/104  Pulse: 89 94 91 (!) 107  Resp:  (!) 21 (!) 25 (!) 28  Temp:      TempSrc:      SpO2: 93% 93% 96% 95%  Weight:      Height:        Intake/Output Summary (Last 24 hours) at 09/10/2021 0912 Last data filed at 09/10/2021 0400 Gross per 24 hour  Intake 958.07 ml  Output 650 ml  Net 308.07 ml      09/10/2021    4:15 AM 09/08/2021    5:29 PM 03/22/2021   10:53 AM  Last 3 Weights  Weight (lbs) 143 lb 4.8 oz 135 lb 135 lb  Weight (kg) 65 kg 61.236 kg 61.236 kg      Telemetry    SR - Personally Reviewed  ECG    09/09/21 ST - Personally Reviewed  Physical Exam   GEN: No acute distress, sleepy Neck: No JVD Cardiac: Tachycardic, no murmurs, rubs, or gallops.  Respiratory: Clear to auscultation bilaterally. GI: Soft, nontender, non-distended  MS: No edema; No deformity. Neuro:  Nonfocal  Psych: Normal affect   Labs    High Sensitivity Troponin:   Recent Labs  Lab 09/08/21 2133 09/09/21 0102 09/09/21 0340  TROPONINIHS 95* 104* 125*     Chemistry Recent Labs  Lab  09/08/21 2133 09/08/21 2159 09/09/21 0102 09/09/21 0340 09/10/21 0110  NA 139 138  --  136 137  K 4.3 4.1  --  3.6 3.7  CL 106  --   --  102 103  CO2 23  --   --  26 27  GLUCOSE 127*  --   --  105* 161*  BUN 19  --   --  16 15  CREATININE 1.14*  --   --  1.16* 1.10*  CALCIUM 9.0  --   --  8.5* 8.7*  MG  --   --  1.8 1.7 2.2  PROT 6.9  --  5.9* 5.7* 5.9*  ALBUMIN 3.2*  --  2.7* 2.7* 2.6*  AST 83*  --  68* 65* 47*  ALT 118*  --  95* 90* 73*  ALKPHOS 252*  --  220* 195* 180*  BILITOT 0.7  --  0.5 0.9 0.5  GFRNONAA 55*  --   --  54* 58*  ANIONGAP 10  --   --  8 7    Lipids No results for input(s): CHOL, TRIG, HDL, LABVLDL, LDLCALC, CHOLHDL in the last 168 hours.  Hematology Recent Labs  Lab 09/08/21  2133 09/08/21 2159 09/09/21 0340 09/10/21 0110  WBC 7.0  --  7.1 8.4  RBC 5.05  --  4.36 4.59  HGB 14.8 16.0* 13.0 13.6  HCT 45.7 47.0* 39.5 41.1  MCV 90.5  --  90.6 89.5  MCH 29.3  --  29.8 29.6  MCHC 32.4  --  32.9 33.1  RDW 15.0  --  14.8 14.9  PLT 320  --  261 278   Thyroid No results for input(s): TSH, FREET4 in the last 168 hours.  BNP Recent Labs  Lab 09/08/21 2133  BNP 1,230.1*    DDimer  Recent Labs  Lab 09/09/21 0102  DDIMER 1.26*     Radiology    DG Chest 2 View  Result Date: 09/08/2021 CLINICAL DATA:  Shortness of breath for 3 weeks. EXAM: CHEST - 2 VIEW COMPARISON:  Chest two views 01/07/2016 and 07/24/2013 FINDINGS: Cardiac silhouette is moderately enlarged and this appears unchanged to mildly worsened from prior. Mediastinal contours are within normal limits. There is interval increase in now moderate inferior and mild mid height of the lungs interstitial pulmonary edema compared to 01/07/2016. This is already been increased on 01/07/2016 compared to normal 07/24/2013 radiographs. There is likely a new tiny right pleural effusion with possible tiny left pleural effusion on lateral view. There again appears to be increased cystic lucency within the  upper lungs suggesting cystic emphysematous changes. No pneumothorax is seen. No acute skeletal abnormality. IMPRESSION: 1. There is mild-to-moderate interstitial thickening greatest within the lung bases. This is increased from 01/07/2016, which was already increased from normal radiographs 07/24/2013. This may represent progressive mild-to-moderate interstitial pulmonary edema. There also appear to be chronic lucent emphysematous changes and interstitial thickening could represent progressive interstitial lung disease. 2. Probable trace right and possible trace left pleural effusions. Electronically Signed   By: Neita Garnet M.D.   On: 09/08/2021 18:12   DG Abd 1 View  Result Date: 09/09/2021 CLINICAL DATA:  Abdominal distension EXAM: ABDOMEN - 1 VIEW COMPARISON:  None Available. FINDINGS: The bowel gas pattern is normal. No radio-opaque calculi or other significant radiographic abnormality are seen. IMPRESSION: Negative. Electronically Signed   By: Charlett Nose M.D.   On: 09/09/2021 00:10   CT Angio Chest Pulmonary Embolism (PE) W or WO Contrast  Result Date: 09/09/2021 CLINICAL DATA:  61 year old female with shortness of breath and abdominal distension. EXAM: CT ANGIOGRAPHY CHEST WITH CONTRAST TECHNIQUE: Multidetector CT imaging of the chest was performed using the standard protocol during bolus administration of intravenous contrast. Multiplanar CT image reconstructions and MIPs were obtained to evaluate the vascular anatomy. RADIATION DOSE REDUCTION: This exam was performed according to the departmental dose-optimization program which includes automated exposure control, adjustment of the mA and/or kV according to patient size and/or use of iterative reconstruction technique. CONTRAST:  5mL OMNIPAQUE IOHEXOL 350 MG/ML SOLN COMPARISON:  Chest radiographs 09/08/2021 and earlier. FINDINGS: Cardiovascular: Excellent contrast bolus timing in the pulmonary arterial tree. No focal filling defect identified  in the pulmonary arteries to suggest acute pulmonary embolism. Cardiomegaly with small volume pericardial effusion. Contrast reflux into the hepatic IVC and hepatic veins which appear mildly dilated. No contrast in the aorta. Calcified coronary artery atherosclerosis on series 5, images 66 and 67. Mediastinum/Nodes: Negative. No mediastinal mass or lymphadenopathy. Lungs/Pleura: Small layering right and trace layering left pleural effusions with simple fluid density. Major airways remain patent. There is generalized pulmonary ground-glass opacity with mild peribronchial and dependent platelike atelectasis in the right lung. Similar  platelike atelectasis in the left lower lobe. Upper Abdomen: Aside from contrast reflux into the hepatic veins visible liver, spleen, adrenal glands, left kidney, and bowel in the upper abdomen appear negative. Musculoskeletal: No acute osseous abnormality identified. Review of the MIP images confirms the above findings. IMPRESSION: 1. No evidence of acute pulmonary embolism. 2. Cardiomegaly with small volume pericardial effusion and evidence of right heart failure. 3. Generalized Pulmonary Edema suspected with a small right > left layering pleural effusions and platelike atelectasis. 4. Calcified Coronary Artery Atherosclerosis. Electronically Signed   By: Odessa Fleming M.D.   On: 09/09/2021 07:15   ECHOCARDIOGRAM COMPLETE  Result Date: 09/09/2021    ECHOCARDIOGRAM REPORT   Patient Name:   Amanda [name] Date of Exam: 09/09/2021 Medical Rec #:  [id number]      Height:       64.0 in Accession #:    [id number]     Weight:       135.0 lb Date of Birth:  November 14, 1960      BSA:          1.655 m Patient Age:    60 years       BP:           152/101 mmHg Patient Gender: F              HR:           103 bpm. Exam Location:  Inpatient Procedure: 2D Echo, Cardiac Doppler and Color Doppler Indications:    Elevated troponin, dyspnea  History:        Patient has no prior history of Echocardiogram  examinations.  Sonographer:    Cleatis Polka Referring Phys: [id number] ANASTASSIA DOUTOVA IMPRESSIONS  1. Left ventricular ejection fraction, by estimation, is 55 to 60%. The left ventricle has normal function. The left ventricle has no regional wall motion abnormalities. There is mild left ventricular hypertrophy. Left ventricular diastolic parameters are consistent with Grade II diastolic dysfunction (pseudonormalization). Elevated left atrial pressure.  2. Right ventricular systolic function is normal. The right ventricular size is mildly enlarged.  3. Left atrial size was severely dilated.  4. Right atrial size was severely dilated.  5. A small pericardial effusion is present.  6. Mild mitral valve regurgitation.  7. The aortic valve is tricuspid. Aortic valve regurgitation is not visualized. Aortic valve sclerosis is present, with no evidence of aortic valve stenosis.  8. The inferior vena cava is dilated in size with <50% respiratory variability, suggesting right atrial pressure of 15 mmHg. FINDINGS  Left Ventricle: Left ventricular ejection fraction, by estimation, is 55 to 60%. The left ventricle has normal function. The left ventricle has no regional wall motion abnormalities. The left ventricular internal cavity size was normal in size. There is  mild left ventricular hypertrophy. Left ventricular diastolic parameters are consistent with Grade II diastolic dysfunction (pseudonormalization). Elevated left atrial pressure. Right Ventricle: The right ventricular size is mildly enlarged. Right vetricular wall thickness was not assessed. Right ventricular systolic function is normal. Left Atrium: Left atrial size was severely dilated. Right Atrium: Right atrial size was severely dilated. Pericardium: A small pericardial effusion is present. Mitral Valve: There is mild thickening of the mitral valve leaflet(s). Mild mitral valve regurgitation. Tricuspid Valve: The tricuspid valve is normal in structure. Tricuspid  valve regurgitation is mild. Aortic Valve: The aortic valve is tricuspid. Aortic valve regurgitation is not visualized. Aortic valve sclerosis is present, with no evidence of aortic valve stenosis. Aortic valve peak  gradient measures 13.4 mmHg. Pulmonic Valve: The pulmonic valve was normal in structure. Pulmonic valve regurgitation is not visualized. Aorta: The aortic root and ascending aorta are structurally normal, with no evidence of dilitation. Venous: The inferior vena cava is dilated in size with less than 50% respiratory variability, suggesting right atrial pressure of 15 mmHg. IAS/Shunts: No atrial level shunt detected by color flow Doppler.  LEFT VENTRICLE PLAX 2D LVIDd:         4.20 cm      Diastology LVIDs:         3.00 cm      LV e' medial:    6.31 cm/s LV PW:         1.40 cm      LV E/e' medial:  15.6 LV IVS:        1.10 cm      LV e' lateral:   6.09 cm/s LVOT diam:     2.00 cm      LV E/e' lateral: 16.2 LV SV:         66 LV SV Index:   40 LVOT Area:     3.14 cm  LV Volumes (MOD) LV vol d, MOD A2C: 112.0 ml LV vol d, MOD A4C: 99.2 ml LV vol s, MOD A2C: 47.5 ml LV vol s, MOD A4C: 40.9 ml LV SV MOD A2C:     64.5 ml LV SV MOD A4C:     99.2 ml LV SV MOD BP:      62.4 ml RIGHT VENTRICLE             IVC RV Basal diam:  4.00 cm     IVC diam: 2.00 cm RV Mid diam:    3.20 cm RV S prime:     11.00 cm/s TAPSE (M-mode): 1.8 cm LEFT ATRIUM             Index        RIGHT ATRIUM           Index LA diam:        4.10 cm 2.48 cm/m   RA Area:     24.90 cm LA Vol (A2C):   80.6 ml 48.69 ml/m  RA Volume:   87.80 ml  53.04 ml/m LA Vol (A4C):   89.9 ml 54.31 ml/m LA Biplane Vol: 87.3 ml 52.74 ml/m  AORTIC VALVE AV Area (Vmax): 2.88 cm AV Vmax:        183.00 cm/s AV Peak Grad:   13.4 mmHg LVOT Vmax:      168.00 cm/s LVOT Vmean:     117.000 cm/s LVOT VTI:       0.209 m  AORTA Ao Root diam: 3.00 cm Ao Asc diam:  2.90 cm MITRAL VALVE               TRICUSPID VALVE MV Area (PHT): 4.36 cm    TR Peak grad:   41.5 mmHg MV  Decel Time: 174 msec    TR Vmax:        322.00 cm/s MV E velocity: 98.50 cm/s MV A velocity: 71.70 cm/s  SHUNTS MV E/A ratio:  1.37        Systemic VTI:  0.21 m                            Systemic Diam: 2.00 cm Dietrich PatesPaula Ross MD Electronically signed by Dietrich PatesPaula Ross MD Signature Date/Time: 09/09/2021/10:40:46 AM  Final     Echocardiogram 09/09/2021:  Impressions: 1. Left ventricular ejection fraction, by estimation, is 55 to 60%. The  left ventricle has normal function. The left ventricle has no regional  wall motion abnormalities. There is mild left ventricular hypertrophy.  Left ventricular diastolic parameters  are consistent with Grade II diastolic dysfunction (pseudonormalization).  Elevated left atrial pressure.   2. Right ventricular systolic function is normal. The right ventricular  size is mildly enlarged.   3. Left atrial size was severely dilated.   4. Right atrial size was severely dilated.   5. A small pericardial effusion is present.   6. Mild mitral valve regurgitation.   7. The aortic valve is tricuspid. Aortic valve regurgitation is not  visualized. Aortic valve sclerosis is present, with no evidence of aortic  valve stenosis.   8. The inferior vena cava is dilated in size with <50% respiratory  variability, suggesting right atrial pressure of 15 mmHg.   Patient Profile     Amanda [name] is a 61 y.o. female with a history of hypertension and polysubstance abuse (tobacco, alcohol, marijuana, and cocaine) who is being seen for evaluation of chest pain and elevated troponin at the request of Dr. Lucianne Muss.  Assessment & Plan     Acute on chronic diastolic HF:  Due to hypertensive urgency from cocaine use.  Increase lisinopril to  and start jardiance  qday.  Wean nitro gtt to off.  Her lung exam is improved today.  EKG is reassuring.  No further cardiac evaluation is necessary.  Will arrange cardiology follow up. [facility] will sign off.   Medication Recommendations:   Lisinopril 20, Jardiance  Other recommendations (labs, testing, etc):  None Follow up as an outpatient:  Will arrange cardiology follow up.  For questions or updates, please contact [facility] Please consult [email or web address] for contact info under        Signed, Orbie Pyo, MD  09/10/2021, 9:12 AM

## 2021-09-10 NOTE — Discharge Summary (Signed)
Physician Discharge Summary  Amanda [name]:[id number] DOB: [birth year] DOA: 09/08/2021  PCP: Pcp, No  Admit date: 09/08/2021  Discharge date: 09/10/2021  Admitted From:Home  Disposition:  Home  Recommendations for Outpatient Follow-up:  Follow up with PCP in 1-2 weeks. Please obtain BMP/CBC in one week. Advised to follow-up with cardiology as scheduled. Advised to refrain from using substances like cocaine  Home Health:None Equipment/Devices:None  Discharge Condition: Stable CODE STATUS: Full code Diet recommendation: Heart Healthy   Brief [facility] Course: This 61 years old female with PMH significant for essential hypertension, substance abuse, tobacco abuse presented in the ED with complaints of shortness of breath for last 3 weeks.  Patient reports she could not sleep flat, also having cough for past 3 weeks but denies any fever. She continues to smoke a pack of cigarettes regularly.  Patient reports she could not walk even short distance without getting short of breath.  She reports occasional cocaine use. On arrival in the ED her blood pressure was 221/151 associated with shortness of breath. Patient was started on nitro drip and also required BiPAP for hypoxic respiratory failure. Patient was admitted for hypertensive emergency, started on nitro drip. Cardiology is consulted.  Troponin slightly elevated likely secondary to demand ischemia in the setting of hypertensive emergency.  Patient was continued on IV Lasix for diuresis.  Echocardiogram shows normal LV function, no regional wall motion abnormalities.  Blood pressure has improved after patient started on amlodipine and lisinopril.  Nitro drip was discontinued.  Urine drug screen positive for cocaine.  Patient felt much better, medication adjustment done by cardiology.  Patient is being cleared and patient wants to be discharged.  Patient was counseled about refraining from substance use.   Discharge  medications: Jardiance 10 mg daily Amlodipine 10 mg daily  Losartan 50 mg daily Nitro sublingual as needed   Discharge Diagnoses:  Principal Problem:   Hypertensive emergency Active Problems:   Elevated troponin   Pulmonary edema   Tobacco abuse   Cocaine abuse (HCC)   History of alcohol abuse   Abnormal chest x-ray   Acute respiratory distress  Hypertensive emergency: Patient presented with cough, shortness of breath requiring BiPAP and blood pressure 220/151.  Troponin trending up. Continue nitro drip and trend troponins. Blood pressure has improved, started on amlodipine and lisinopril. Try to wean nitro drip and discontinue. Cardiology is consulted,  blood pressure has improved.   Acute diastolic CHF: Patient presented with shortness of breath, cough.  Likely in the setting of hypertensive emergency. Chest x-ray consistent with fluid overload.  BNP 1230. Patient was given IV diuresis Echocardiogram shows LVEF 55 to 60% no regional wall motion abnormality. Continue Lasix 40 mg IV daily, initiated on lisinopril. Monitor daily weight, intake output charting.   Elevated troponin: Likely demand ischemia in the setting of hypertensive emergency. Troponin 95> 104> 125.  EKG: Sinus rhythm, bilateral atrial enlargement Echo : LVEF 55 to 60%.  No regional wall motion abnormality.  Mild small pericardial effusion.   Cardiology is consulted.  No ischemic work-up recommended.     Cocaine abuse: UDS positive for cocaine+, avoid beta-blockers. Echocardiogram shows LVEF 55 to 60%.  Normal LVEF. Severe hypertension could be in the setting of cocaine use.   History of alcohol abuse: Currently in remission.  Monitor for any signs of withdrawal.   Tobacco use: Counseled in detail.  Nicotine patch.  Discharge Instructions  Discharge Instructions     Call MD for:  difficulty breathing, headache or  visual disturbances   Complete by: As directed    Call MD for:  persistant  dizziness or light-headedness   Complete by: As directed    Call MD for:  persistant nausea and vomiting   Complete by: As directed    Diet - low sodium heart healthy   Complete by: As directed    Diet Carb Modified   Complete by: As directed    Discharge instructions   Complete by: As directed    Advised to follow-up with primary care physician in 1 week. Advised to follow-up with cardiology as scheduled. Advised to refrain from using substances like cocaine   Increase activity slowly   Complete by: As directed       Allergies as of 09/10/2021   No Known Allergies      Medication List     STOP taking these medications    ALEVE PM PO       TAKE these medications    acetaminophen 500 MG tablet Commonly known as: TYLENOL Take 1,000 mg by mouth every 6 (six) hours as needed for moderate pain or headache.   amLODipine 10 MG tablet Commonly known as: NORVASC Take 1 tablet (10 mg total) by mouth daily. Start taking on: September 11, 2021   Jardiance 10 MG Tabs tablet Generic drug: empagliflozin Take 1 tablet (10 mg total) by mouth daily. Start taking on: September 11, 2021   losartan 50 MG tablet Commonly known as: Cozaar Take 1 tablet (50 mg total) by mouth daily.   nitroGLYCERIN 0.4 MG SL tablet Commonly known as: NITROSTAT Place 1 tablet (0.4 mg total) under the tongue every 5 (five) minutes as needed for chest pain (BLOOD PRESSURES GREATER THAN 180 systolic.).        Follow-up Information     Cipriano Bunker, MD Follow up in 1 week(s).   Specialty: Family Medicine Contact information: [address] [place] [address] [phone]         Orbie Pyo, MD Follow up in 2 week(s).   Specialty: Cardiology Contact information: [address] [place] [address] [place] [address] [phone]         [facility]. Go in 13 day(s).   Specialty: Cardiology Why: Hospital follow up PLEASE bring curent list of  medications FREE valet parking, Entrance C, off of [employer] information: [address] [id number] mc [place] [address] [phone]               No Known Allergies  Consultations: Cardiology   Procedures/Studies: DG Chest 2 View  Result Date: 09/08/2021 CLINICAL DATA:  Shortness of breath for 3 weeks. EXAM: CHEST - 2 VIEW COMPARISON:  Chest two views 01/07/2016 and 07/24/2013 FINDINGS: Cardiac silhouette is moderately enlarged and this appears unchanged to mildly worsened from prior. Mediastinal contours are within normal limits. There is interval increase in now moderate inferior and mild mid height of the lungs interstitial pulmonary edema compared to 01/07/2016. This is already been increased on 01/07/2016 compared to normal 07/24/2013 radiographs. There is likely a new tiny right pleural effusion with possible tiny left pleural effusion on lateral view. There again appears to be increased cystic lucency within the upper lungs suggesting cystic emphysematous changes. No pneumothorax is seen. No acute skeletal abnormality. IMPRESSION: 1. There is mild-to-moderate interstitial thickening greatest within the lung bases. This is increased from 01/07/2016, which was already increased from normal radiographs 07/24/2013. This may represent progressive mild-to-moderate  interstitial pulmonary edema. There also appear to be chronic lucent emphysematous changes and interstitial thickening could represent progressive interstitial lung disease. 2. Probable trace right and possible trace left pleural effusions. Electronically Signed   By: Neita Garnet M.D.   On: 09/08/2021 18:12   DG Abd 1 View  Result Date: 09/09/2021 CLINICAL DATA:  Abdominal distension EXAM: ABDOMEN - 1 VIEW COMPARISON:  None Available. FINDINGS: The bowel gas pattern is normal. No radio-opaque calculi or other significant radiographic abnormality are seen. IMPRESSION: Negative.  Electronically Signed   By: Charlett Nose M.D.   On: 09/09/2021 00:10   CT Angio Chest Pulmonary Embolism (PE) W or WO Contrast  Result Date: 09/09/2021 CLINICAL DATA:  61 year old female with shortness of breath and abdominal distension. EXAM: CT ANGIOGRAPHY CHEST WITH CONTRAST TECHNIQUE: Multidetector CT imaging of the chest was performed using the standard protocol during bolus administration of intravenous contrast. Multiplanar CT image reconstructions and MIPs were obtained to evaluate the vascular anatomy. RADIATION DOSE REDUCTION: This exam was performed according to the departmental dose-optimization program which includes automated exposure control, adjustment of the mA and/or kV according to patient size and/or use of iterative reconstruction technique. CONTRAST:  46mL OMNIPAQUE IOHEXOL 350 MG/ML SOLN COMPARISON:  Chest radiographs 09/08/2021 and earlier. FINDINGS: Cardiovascular: Excellent contrast bolus timing in the pulmonary arterial tree. No focal filling defect identified in the pulmonary arteries to suggest acute pulmonary embolism. Cardiomegaly with small volume pericardial effusion. Contrast reflux into the hepatic IVC and hepatic veins which appear mildly dilated. No contrast in the aorta. Calcified coronary artery atherosclerosis on series 5, images 66 and 67. Mediastinum/Nodes: Negative. No mediastinal mass or lymphadenopathy. Lungs/Pleura: Small layering right and trace layering left pleural effusions with simple fluid density. Major airways remain patent. There is generalized pulmonary ground-glass opacity with mild peribronchial and dependent platelike atelectasis in the right lung. Similar platelike atelectasis in the left lower lobe. Upper Abdomen: Aside from contrast reflux into the hepatic veins visible liver, spleen, adrenal glands, left kidney, and bowel in the upper abdomen appear negative. Musculoskeletal: No acute osseous abnormality identified. Review of the MIP images confirms  the above findings. IMPRESSION: 1. No evidence of acute pulmonary embolism. 2. Cardiomegaly with small volume pericardial effusion and evidence of right heart failure. 3. Generalized Pulmonary Edema suspected with a small right > left layering pleural effusions and platelike atelectasis. 4. Calcified Coronary Artery Atherosclerosis. Electronically Signed   By: Odessa Fleming M.D.   On: 09/09/2021 07:15   ECHOCARDIOGRAM COMPLETE  Result Date: 09/09/2021    ECHOCARDIOGRAM REPORT   Patient Name:   Amanda [name] Date of Exam: 09/09/2021 Medical Rec #:  [id number]      Height:       64.0 in Accession #:    [id number]     Weight:       135.0 lb Date of Birth:  10-05-1960      BSA:          1.655 m Patient Age:    60 years       BP:           152/101 mmHg Patient Gender: F              HR:           103 bpm. Exam Location:  Inpatient Procedure: 2D Echo, Cardiac Doppler and Color Doppler Indications:    Elevated troponin, dyspnea  History:        Patient has no prior history  of Echocardiogram examinations.  Sonographer:    Cleatis Polka Referring Phys: [id number] ANASTASSIA DOUTOVA IMPRESSIONS  1. Left ventricular ejection fraction, by estimation, is 55 to 60%. The left ventricle has normal function. The left ventricle has no regional wall motion abnormalities. There is mild left ventricular hypertrophy. Left ventricular diastolic parameters are consistent with Grade II diastolic dysfunction (pseudonormalization). Elevated left atrial pressure.  2. Right ventricular systolic function is normal. The right ventricular size is mildly enlarged.  3. Left atrial size was severely dilated.  4. Right atrial size was severely dilated.  5. A small pericardial effusion is present.  6. Mild mitral valve regurgitation.  7. The aortic valve is tricuspid. Aortic valve regurgitation is not visualized. Aortic valve sclerosis is present, with no evidence of aortic valve stenosis.  8. The inferior vena cava is dilated in size with <50% respiratory  variability, suggesting right atrial pressure of 15 mmHg. FINDINGS  Left Ventricle: Left ventricular ejection fraction, by estimation, is 55 to 60%. The left ventricle has normal function. The left ventricle has no regional wall motion abnormalities. The left ventricular internal cavity size was normal in size. There is  mild left ventricular hypertrophy. Left ventricular diastolic parameters are consistent with Grade II diastolic dysfunction (pseudonormalization). Elevated left atrial pressure. Right Ventricle: The right ventricular size is mildly enlarged. Right vetricular wall thickness was not assessed. Right ventricular systolic function is normal. Left Atrium: Left atrial size was severely dilated. Right Atrium: Right atrial size was severely dilated. Pericardium: A small pericardial effusion is present. Mitral Valve: There is mild thickening of the mitral valve leaflet(s). Mild mitral valve regurgitation. Tricuspid Valve: The tricuspid valve is normal in structure. Tricuspid valve regurgitation is mild. Aortic Valve: The aortic valve is tricuspid. Aortic valve regurgitation is not visualized. Aortic valve sclerosis is present, with no evidence of aortic valve stenosis. Aortic valve peak gradient measures 13.4 mmHg. Pulmonic Valve: The pulmonic valve was normal in structure. Pulmonic valve regurgitation is not visualized. Aorta: The aortic root and ascending aorta are structurally normal, with no evidence of dilitation. Venous: The inferior vena cava is dilated in size with less than 50% respiratory variability, suggesting right atrial pressure of 15 mmHg. IAS/Shunts: No atrial level shunt detected by color flow Doppler.  LEFT VENTRICLE PLAX 2D LVIDd:         4.20 cm      Diastology LVIDs:         3.00 cm      LV e' medial:    6.31 cm/s LV PW:         1.40 cm      LV E/e' medial:  15.6 LV IVS:        1.10 cm      LV e' lateral:   6.09 cm/s LVOT diam:     2.00 cm      LV E/e' lateral: 16.2 LV SV:         66 LV  SV Index:   40 LVOT Area:     3.14 cm  LV Volumes (MOD) LV vol d, MOD A2C: 112.0 ml LV vol d, MOD A4C: 99.2 ml LV vol s, MOD A2C: 47.5 ml LV vol s, MOD A4C: 40.9 ml LV SV MOD A2C:     64.5 ml LV SV MOD A4C:     99.2 ml LV SV MOD BP:      62.4 ml RIGHT VENTRICLE             IVC RV  Basal diam:  4.00 cm     IVC diam: 2.00 cm RV Mid diam:    3.20 cm RV S prime:     11.00 cm/s TAPSE (M-mode): 1.8 cm LEFT ATRIUM             Index        RIGHT ATRIUM           Index LA diam:        4.10 cm 2.48 cm/m   RA Area:     24.90 cm LA Vol (A2C):   80.6 ml 48.69 ml/m  RA Volume:   87.80 ml  53.04 ml/m LA Vol (A4C):   89.9 ml 54.31 ml/m LA Biplane Vol: 87.3 ml 52.74 ml/m  AORTIC VALVE AV Area (Vmax): 2.88 cm AV Vmax:        183.00 cm/s AV Peak Grad:   13.4 mmHg LVOT Vmax:      168.00 cm/s LVOT Vmean:     117.000 cm/s LVOT VTI:       0.209 m  AORTA Ao Root diam: 3.00 cm Ao Asc diam:  2.90 cm MITRAL VALVE               TRICUSPID VALVE MV Area (PHT): 4.36 cm    TR Peak grad:   41.5 mmHg MV Decel Time: 174 msec    TR Vmax:        322.00 cm/s MV E velocity: 98.50 cm/s MV A velocity: 71.70 cm/s  SHUNTS MV E/A ratio:  1.37        Systemic VTI:  0.21 m                            Systemic Diam: 2.00 cm Dietrich PatesPaula Ross MD Electronically signed by Dietrich PatesPaula Ross MD Signature Date/Time: 09/09/2021/10:40:46 AM    Final    Echocardiogram.    Subjective: Patient was seen and examined at bedside.  Overnight events noted.   Patient reports feeling better,  denies any chest pain. Patient wants to be discharged.  Blood pressure has improved.  Discharge Exam: Vitals:   09/10/21 0933 09/10/21 1412  BP: (!) 149/95 (!) 146/94  Pulse:    Resp: 19   Temp:    SpO2:     Vitals:   09/10/21 0600 09/10/21 0732 09/10/21 0933 09/10/21 1412  BP: (!) 169/102 (!) 171/104 (!) 149/95 (!) 146/94  Pulse: 91 (!) 107    Resp: (!) 25 20 19    Temp:      TempSrc:      SpO2: 96% 95%    Weight:      Height:        General: Pt is alert, awake, not in  acute distress Cardiovascular: RRR, S1/S2 +, no rubs, no gallops Respiratory: CTA bilaterally, no wheezing, no rhonchi Abdominal: Soft, NT, ND, bowel sounds + Extremities: no edema, no cyanosis    The results of significant diagnostics from this hospitalization (including imaging, microbiology, ancillary and laboratory) are listed below for reference.     Microbiology: Recent Results (from the past 240 hour(s))  SARS Coronavirus 2 by RT PCR (hospital order, performed [facility]) *cepheid single result test* Anterior Nasal Swab     Status: None   Collection Time: 09/09/21 12:13 AM   Specimen: Anterior Nasal Swab  Result Value Ref Range Status   SARS Coronavirus 2 by RT PCR NEGATIVE NEGATIVE Final    Comment: (NOTE) SARS-CoV-2 target  nucleic acids are NOT DETECTED.  The SARS-CoV-2 RNA is generally detectable in upper and lower respiratory specimens during the acute phase of infection. The lowest concentration of SARS-CoV-2 viral copies this assay can detect is 250 copies / mL. A negative result does not preclude SARS-CoV-2 infection and should not be used as the sole basis for treatment or other patient management decisions.  A negative result may occur with improper specimen collection / handling, submission of specimen other than nasopharyngeal swab, presence of viral mutation(s) within the areas targeted by this assay, and inadequate number of viral copies (<250 copies / mL). A negative result must be combined with clinical observations, patient history, and epidemiological information.  Fact Sheet for Patients:   RoadLapTop.co.za  Fact Sheet for Healthcare Providers: http://kim-miller.com/  This test is not yet approved or  cleared by the [place] FDA and has been authorized for detection and/or diagnosis of SARS-CoV-2 by FDA under an Emergency Use Authorization (EUA).  This EUA will remain in effect  (meaning this test can be used) for the duration of the COVID-19 declaration under Section 564(b)(1) of the Act, 21 U.S.C. section 360bbb-3(b)(1), unless the authorization is terminated or revoked sooner.  Performed at [facility], [address], [place], [place] [address]   MRSA Next Gen by PCR, Nasal     Status: None   Collection Time: 09/09/21  3:20 PM   Specimen: Nasal Mucosa; Nasal Swab  Result Value Ref Range Status   MRSA by PCR Next Gen NOT DETECTED NOT DETECTED Final    Comment: (NOTE) The GeneXpert MRSA Assay (FDA approved for NASAL specimens only), is one component of a comprehensive MRSA colonization surveillance program. It is not intended to diagnose MRSA infection nor to guide or monitor treatment for MRSA infections. Test performance is not FDA approved in patients less than 63 years old. Performed at [facility], [address], [place], [place] [address]      Labs: BNP (last 3 results) Recent Labs    09/08/21 2133  BNP 1,230.1*   Basic Metabolic Panel: Recent Labs  Lab 09/08/21 2133 09/08/21 2159 09/09/21 0102 09/09/21 0340 09/10/21 0110  NA 139 138  --  136 137  K 4.3 4.1  --  3.6 3.7  CL 106  --   --  102 103  CO2 23  --   --  26 27  GLUCOSE 127*  --   --  105* 161*  BUN 19  --   --  16 15  CREATININE 1.14*  --   --  1.16* 1.10*  CALCIUM 9.0  --   --  8.5* 8.7*  MG  --   --  1.8 1.7 2.2  PHOS  --   --  4.0 3.7 3.2   Liver Function Tests: Recent Labs  Lab 09/08/21 2133 09/09/21 0102 09/09/21 0340 09/10/21 0110  AST 83* 68* 65* 47*  ALT 118* 95* 90* 73*  ALKPHOS 252* 220* 195* 180*  BILITOT 0.7 0.5 0.9 0.5  PROT 6.9 5.9* 5.7* 5.9*  ALBUMIN 3.2* 2.7* 2.7* 2.6*   No results for input(s): LIPASE, AMYLASE in the last 168 hours. No results for input(s): AMMONIA in the last 168 hours. CBC: Recent Labs  Lab 09/08/21 2133 09/08/21 2159 09/09/21 0340 09/10/21 0110  WBC 7.0  --  7.1 8.4  NEUTROABS 4.5  --   --   --    HGB 14.8 16.0* 13.0 13.6  HCT 45.7 47.0* 39.5  41.1  MCV 90.5  --  90.6 89.5  PLT 320  --  261 278   Cardiac Enzymes: Recent Labs  Lab 09/09/21 0102  CKTOTAL 274*   BNP: Invalid input(s): POCBNP CBG: No results for input(s): GLUCAP in the last 168 hours. D-Dimer Recent Labs    09/09/21 0102  DDIMER 1.26*   Hgb A1c Recent Labs    09/10/21 0110  HGBA1C 6.8*   Lipid Profile No results for input(s): CHOL, HDL, LDLCALC, TRIG, CHOLHDL, LDLDIRECT in the last 72 hours. Thyroid function studies No results for input(s): TSH, T4TOTAL, T3FREE, THYROIDAB in the last 72 hours.  Invalid input(s): FREET3 Anemia work up No results for input(s): VITAMINB12, FOLATE, FERRITIN, TIBC, IRON, RETICCTPCT in the last 72 hours. Urinalysis    Component Value Date/Time   COLORURINE STRAW (A) 09/09/2021 0015   APPEARANCEUR CLEAR 09/09/2021 0015   LABSPEC 1.008 09/09/2021 0015   PHURINE 6.0 09/09/2021 0015   GLUCOSEU NEGATIVE 09/09/2021 0015   HGBUR SMALL (A) 09/09/2021 0015   BILIRUBINUR NEGATIVE 09/09/2021 0015   BILIRUBINUR Small 10/11/2013 1200   KETONESUR NEGATIVE 09/09/2021 0015   PROTEINUR NEGATIVE 09/09/2021 0015   UROBILINOGEN 0.2 10/11/2013 1200   UROBILINOGEN 0.2 02/28/2008 1612   NITRITE NEGATIVE 09/09/2021 0015   LEUKOCYTESUR NEGATIVE 09/09/2021 0015   Sepsis Labs Invalid input(s): PROCALCITONIN,  WBC,  LACTICIDVEN Microbiology Recent Results (from the past 240 hour(s))  SARS Coronavirus 2 by RT PCR (hospital order, performed in [facility] hospital lab) *cepheid single result test* Anterior Nasal Swab     Status: None   Collection Time: 09/09/21 12:13 AM   Specimen: Anterior Nasal Swab  Result Value Ref Range Status   SARS Coronavirus 2 by RT PCR NEGATIVE NEGATIVE Final    Comment: (NOTE) SARS-CoV-2 target nucleic acids are NOT DETECTED.  The SARS-CoV-2 RNA is generally detectable in upper and lower respiratory specimens during the acute phase of infection. The  lowest concentration of SARS-CoV-2 viral copies this assay can detect is 250 copies / mL. A negative result does not preclude SARS-CoV-2 infection and should not be used as the sole basis for treatment or other patient management decisions.  A negative result may occur with improper specimen collection / handling, submission of specimen other than nasopharyngeal swab, presence of viral mutation(s) within the areas targeted by this assay, and inadequate number of viral copies (<250 copies / mL). A negative result must be combined with clinical observations, patient history, and epidemiological information.  Fact Sheet for Patients:   RoadLapTop.co.za  Fact Sheet for Healthcare Providers: http://kim-miller.com/  This test is not yet approved or  cleared by the [place] FDA and has been authorized for detection and/or diagnosis of SARS-CoV-2 by FDA under an Emergency Use Authorization (EUA).  This EUA will remain in effect (meaning this test can be used) for the duration of the COVID-19 declaration under Section 564(b)(1) of the Act, 21 U.S.C. section 360bbb-3(b)(1), unless the authorization is terminated or revoked sooner.  Performed at [facility], [address] [place], [place] [address]   MRSA Next Gen by PCR, Nasal     Status: None   Collection Time: 09/09/21  3:20 PM   Specimen: Nasal Mucosa; Nasal Swab  Result Value Ref Range Status   MRSA by PCR Next Gen NOT DETECTED NOT DETECTED Final    Comment: (NOTE) The GeneXpert MRSA Assay (FDA approved for NASAL specimens only), is one component of a comprehensive MRSA colonization surveillance program. It is not  intended to diagnose MRSA infection nor to guide or monitor treatment for MRSA infections. Test performance is not FDA approved in patients less than 28 years old. Performed at [facility], [address], [place], [place] [address]      Time  coordinating discharge: Over 30 minutes  SIGNED:   Cipriano Bunker, MD  [facility] 09/10/2021, 3:55 PM Pager   If 7PM-7AM, please contact night-coverage

## 2021-09-10 NOTE — Discharge Instructions (Signed)
Advised to follow-up with primary care physician in 1 week. Advised to follow-up with cardiology as scheduled. Advised to refrain from using substances like cocaine

## 2021-09-10 NOTE — Progress Notes (Incomplete)
RN went over d/c summary w/ pt. Pt removed her PIVs earlier. Belongings w/ pt, including TOC meds. Pt's husband transporting pt home

## 2021-09-10 NOTE — Progress Notes (Addendum)
Heart Failure Stewardship Pharmacist Progress Note   PCP: Pcp, No PCP-Cardiologist: None    HPI:  61 yo AAF with PMH significant for HTN, polysubstance use (occasional cocaine, tobacco - 1 PPD, marijuana, alcohol) who presented to [facility] ED with significant orthopnea, dyspnea and cough over the past three weeks.  She reported medication non-adherence for years due to cost.  On arrival, she was found to have acute severe HTN with BP of 221/151 and dyspnea requiring BiPAP and a NTG drip.  UDS was positive for cocaine.  BNP was elevated, abdomen was distended on exam and CXR in the ED showed mild-moderate interstitial pulmonary edema.  There is no history of CHF noted in her chart.  She was given Lasix IV 20 mg x1 with no uop charted.   CTA on 05/30 showed cardiomegaly with small volume pericardial effusion, evidence of right-sided HF, generalized pulmonary edema and negative for PE.  Her Echo on 05/30 revealed LVEF 55-60% with mild LVH, G2DD, normal RV function, severe biatrial dilation, and mild MR.  She was started on Lasix IV 40 mg daily with good uop and transitioned to 3L [place].  Per cardiology, she still was having noticeable dyspnea when talking and didn't report any improvement in her breathing; JVD+ but no LEE noted on exam.  No plans for ischemic workup.    Current HF Medications: Diuretic: furosemide 40 mg IV daily ACE/ARB/ARNI: lisinopril 10 mg daily  Prior to admission HF Medications: None  Pertinent Lab Values: Serum creatinine 1.1, BUN 15, Potassium 3.7, Sodium 137, BNP 1230, Magnesium 2.2, A1c (none collected)  Vital Signs: Weight: 143 lbs (admission weight: 135 lbs) Blood pressure: 140-60s/90-100s - 221/151 on admit Heart rate: 90s I/O: -2 L yesterday; net -1 L  Medication Assistance / Insurance Benefits Check: Does the patient have prescription insurance?  No  Does the patient qualify for medication assistance through manufacturers or grants?   Yes Eligible grants and/or  patient assistance programs: yes Medication assistance applications in progress: Jardiance  Medication assistance applications approved: no  Outpatient Pharmacy:  Prior to admission outpatient pharmacy: none Is the patient willing to use [facility] TOC pharmacy at discharge? Yes Is the patient willing to transition their outpatient pharmacy to utilize a [facility] outpatient pharmacy?   Yes    Assessment: 1. Acute on diastolic CHF (LVEF 55-60%) with G2DD, due to cocaine use and uncontrolled HTN. NYHA class IV symptoms. - BP remains elevated and trending up - On furosemide IV 40 mg daily - good response with 2L uop and now transitioned off BiPAP to RA.  Still with significant volume on board. - No beta-blocker with ongoing cocaine use (use carvedilol if needed) - Currently on lisinopril 10 mg daily - Eventually start SGLT2i pending renal function and A1c - Potassium still below goal despite supplementation yesterday (3.6 > 40 mEq > 3.7)   Plan: 1) Medication changes recommended at this time: - Switch lisinopril 10 mg to losartan 50 mg daily.  - Start spironolactone 12.5 mg daily to facilitate BP control, diuresis and K >4 - Give potassium 40 mEq x1 to maintain K > 4 - Eventually stop amlodipine to allow for GDMT titration and avoid LEE  2) Patient assistance: - Jardiance patient assistance forms signed  3)  Education  - Patient has been educated on current HF medications and potential additions to HF medication regimen - Patient verbalizes understanding that over the next few months, these medication doses may change and more medications may be added to  optimize HF regimen - Patient has been educated on basic disease state pathophysiology and goals of therapy  Filbert Schilder, PharmD PGY1 Pharmacy Resident 09/10/2021  6:56 AM

## 2021-09-19 NOTE — Progress Notes (Incomplete)
HEART & VASCULAR TRANSITION OF CARE CONSULT NOTE     Referring Physician: Primary Care: Primary Cardiologist:  HPI: Referred to clinic by *** for heart failure consultation.   Cardiac Testing    Review of Systems: [y] = yes, [ ]  = no   General: Weight gain [ ] ; Weight loss [ ] ; Anorexia [ ] ; Fatigue [ ] ; Fever [ ] ; Chills [ ] ; Weakness [ ]   Cardiac: Chest pain/pressure [ ] ; Resting SOB [ ] ; Exertional SOB [ ] ; Orthopnea [ ] ; Pedal Edema [ ] ; Palpitations [ ] ; Syncope [ ] ; Presyncope [ ] ; Paroxysmal nocturnal dyspnea[ ]   Pulmonary: Cough [ ] ; Wheezing[ ] ; Hemoptysis[ ] ; Sputum [ ] ; Snoring [ ]   GI: Vomiting[ ] ; Dysphagia[ ] ; Melena[ ] ; Hematochezia [ ] ; Heartburn[ ] ; Abdominal pain [ ] ; Constipation [ ] ; Diarrhea [ ] ; BRBPR [ ]   GU: Hematuria[ ] ; Dysuria [ ] ; Nocturia[ ]   Vascular: Pain in legs with walking [ ] ; Pain in feet with lying flat [ ] ; Non-healing sores [ ] ; Stroke [ ] ; TIA [ ] ; Slurred speech [ ] ;  Neuro: Headaches[ ] ; Vertigo[ ] ; Seizures[ ] ; Paresthesias[ ] ;Blurred vision [ ] ; Diplopia [ ] ; Vision changes [ ]   Ortho/Skin: Arthritis [ ] ; Joint pain [ ] ; Muscle pain [ ] ; Joint swelling [ ] ; Back Pain [ ] ; Rash [ ]   Psych: Depression[ ] ; Anxiety[ ]   Heme: Bleeding problems [ ] ; Clotting disorders [ ] ; Anemia [ ]   Endocrine: Diabetes [ ] ; Thyroid dysfunction[ ]    Past Medical History:  Diagnosis Date   Hypertension     Current Outpatient Medications  Medication Sig Dispense Refill   acetaminophen (TYLENOL) 500 MG tablet Take 1,000 mg by mouth every 6 (six) hours as needed for moderate pain or headache.     amLODipine (NORVASC) 10 MG tablet Take 1 tablet (10 mg total) by mouth daily. 30 tablet 1   empagliflozin (JARDIANCE) 10 MG TABS tablet Take 1 tablet (10 mg total) by mouth daily. 30 tablet 1   losartan (COZAAR) 50 MG tablet Take 1 tablet (50 mg total) by mouth daily. 30 tablet 3   nitroGLYCERIN (NITROSTAT) 0.4 MG SL tablet Place 1 tablet (0.4 mg total) under  the tongue every 5 (five) minutes as needed for chest pain (BLOOD PRESSURES GREATER THAN 99991111 systolic.). 25 tablet 1   No current facility-administered medications for this visit.    No Known Allergies    Social History   Socioeconomic History   Marital status: Single    Spouse name: Not on file   Number of children: 4   Years of education: Not on file   Highest education level: High school graduate  Occupational History    Comment: Not working right now  Tobacco Use   Smoking status: Some Days    Types: Cigarettes   Smokeless tobacco: Never  Vaping Use   Vaping Use: Never used  Substance and Sexual Activity   Alcohol use: Yes    Comment: 3 times a week   Drug use: Yes    Types: "Crack" cocaine    Comment: 1 week ago   Sexual activity: Yes  Other Topics Concern   Not on file  Social History Narrative   Not on file   Social Determinants of Health   Financial Resource Strain: Medium Risk (09/10/2021)   Overall Financial Resource Strain (CARDIA)    Difficulty of Paying Living Expenses: Somewhat hard  Food Insecurity: Food Insecurity Present (09/10/2021)   Hunger  Vital Sign    Worried About Charity fundraiser in the Last Year: Sometimes true    Ran Out of Food in the Last Year: Never true  Transportation Needs: No Transportation Needs (09/10/2021)   PRAPARE - Hydrologist (Medical): No    Lack of Transportation (Non-Medical): No  Physical Activity: Not on file  Stress: Not on file  Social Connections: Not on file  Intimate Partner Violence: Not on file      Family History  Problem Relation Age of Onset   Heart disease Mother        had a pacemaker    There were no vitals filed for this visit.  PHYSICAL EXAM: General:  Well appearing. No respiratory difficulty HEENT: normal Neck: supple. no JVD. Carotids 2+ bilat; no bruits. No lymphadenopathy or thryomegaly appreciated. Cor: PMI nondisplaced. Regular rate & rhythm. No rubs,  gallops or murmurs. Lungs: clear Abdomen: soft, nontender, nondistended. No hepatosplenomegaly. No bruits or masses. Good bowel sounds. Extremities: no cyanosis, clubbing, rash, edema Neuro: alert & oriented x 3, cranial nerves grossly intact. moves all 4 extremities w/o difficulty. Affect pleasant.  ECG:   ASSESSMENT & PLAN:  NYHA *** GDMT  Diuretic- BB- Ace/ARB/ARNI MRA SGLT2i    Referred to HFSW (PCP, Medications, Transportation, ETOH Abuse, Drug Abuse, Insurance, Financial ): Yes or No Refer to Pharmacy: Yes or No Refer to Home Health: Yes on No Refer to Advanced Heart Failure Clinic: Yes or no  Refer to General Cardiology: Yes or No  Follow up

## 2021-09-20 NOTE — Progress Notes (Incomplete)
Heart [facility] Vascular Center Transitions of Care Clinic Heart Failure Pharmacist Encounter  PCP: Pcp, No PCP-Cardiologist: None  HPI: 61 yo AAF with PMH significant for HTN, polysubstance use (occasional cocaine, tobacco - 1 PPD, marijuana, alcohol) who presented to [facility] ED with significant orthopnea, dyspnea and cough over the past three weeks.  She reported medication non-adherence for years due to cost.  On arrival, she was found to have acute severe HTN with BP of 221/151 and dyspnea requiring BiPAP and a NTG drip.  UDS was positive for cocaine.  BNP was elevated, abdomen was distended on exam and CXR in the ED showed mild-moderate interstitial pulmonary edema. CTA on 05/30 showed cardiomegaly with small volume pericardial effusion, evidence of right-sided HF, generalized pulmonary edema and negative for PE.  Her Echo on 05/30 revealed LVEF 55-60% with mild LVH, G2DD, normal RV function, severe biatrial dilation, and mild MR.  She was diuresised with IV furosemide Per cardiology, she still was having noticeable dyspnea when talking and didn't report any improvement in her breathing; JVD+ but no LEE noted on exam.  No plans for ischemic workup. She was weaned off NTG gtt and placed on amlodpine and lisinopril.   Today, Amanda [name] presents to the Heart Failure [facility] for follow up.   Shortness of breath/dyspnea on exertion? {YES NO:22349}  Orthopnea/PND? {YES NO:22349} Edema? {YES NO:22349} Lightheadedness/dizziness? {YES NO:22349} Daily weights at home? {YES NO:22349} Blood pressure/heart rate monitoring at home? {YES [id number] Following low-sodium/fluid-restricted diet? {YES NO:22349} Taking medications as prescribed? {YES NO:22349}  HF Medications: Losartan 50 mg daily Jardiance 10 mg daily  Has the patient been experiencing any side effects to the medications prescribed?  {YES NO:22349}  Does the patient have any problems obtaining medications due to transportation or finances?    {YES NO:22349}  Understanding of regimen: {excellent/good/fair/poor:19665} Understanding of indications: {excellent/good/fair/poor:19665} Potential of compliance: {excellent/good/fair/poor:19665} Patient understands to avoid NSAIDs. Patient understands to avoid decongestants.   Pertinent Lab Values: Labs 09/10/2021: Serum creatinine 1.1, BUN 15, Potassium 3.7, Sodium 137, Magnesium 2.2 ***  Vital Signs: Weight: *** lbs (discharge weight: 143.3 lbs) Blood pressure: *** 145-155/95 Heart rate: *** 90  Medication Assistance / Insurance Benefits Check: Does the patient have prescription insurance?  No  Does the patient qualify for medication assistance through manufacturers or grants?   Yes Eligible grants and/or patient assistance programs: yes Medication assistance applications in progress: Jardiance *** Medication assistance applications approved: no  Outpatient Pharmacy:  Current outpatient pharmacy: none Was the [facility] pharmacy used to supply discharge medications? yes  If TOC pharmacy was used, were the refills transferred out to current pharmacy yet? {YES NO:22349}  Is the patient willing to transition their outpatient pharmacy to utilize a [facility] outpatient pharmacy with or without mail order?   Yes  Assessment: 1) Chronic diastolic CHF (LVEF 55-60%) with G2DD, due to cocaine use and uncontrolled HTN. NYHA class IV symptoms. -  -volume?? No diuretic at d/c A. Losartan> Entresto 49/51 B. Spironolactone 25 mg daily  Plan: 1) Medication changes: -   2) Patient Assistance: -  3) Follow up: - Next appointment with *** on ***  Drake Leach, PharmD, BCPS PGY2 Cardiology Pharmacy Resident

## 2021-09-23 ENCOUNTER — Telehealth (HOSPITAL_COMMUNITY): Payer: Self-pay | Admitting: *Deleted

## 2021-09-23 ENCOUNTER — Inpatient Hospital Stay (HOSPITAL_COMMUNITY): Admit: 2021-09-23 | Discharge: 2021-09-23 | Disposition: A | Discharge status: Home / Self Care | Payer: Self-pay

## 2021-09-23 NOTE — Telephone Encounter (Signed)
Heart Failure Nurse Navigator Progress Note   Mailbox full, unable to leave appointment reminder message for 11 am on 09/23/21  Rhae Hammock, BSN, RN Heart Failure Print production planner Chat Only

## 2021-10-06 ENCOUNTER — Telehealth (HOSPITAL_COMMUNITY): Payer: Self-pay

## 2021-10-06 ENCOUNTER — Encounter (HOSPITAL_COMMUNITY): Payer: Self-pay

## 2021-10-16 ENCOUNTER — Telehealth (HOSPITAL_COMMUNITY): Payer: Self-pay | Admitting: *Deleted

## 2021-10-16 ENCOUNTER — Encounter (HOSPITAL_COMMUNITY): Payer: Self-pay

## 2021-10-16 NOTE — Telephone Encounter (Signed)
Call attempted to confirm HV TOC appt 10/16/21 @ 3 pm. HIPPA appropriate VM left with callback number.    If patient NO SHOW, DO NOT RESCHEDULE.   Rhae Hammock, BSN, Scientist, clinical (histocompatibility and immunogenetics) Only

## 2021-10-23 ENCOUNTER — Other Ambulatory Visit (HOSPITAL_COMMUNITY): Payer: Self-pay

## 2021-10-29 ENCOUNTER — Other Ambulatory Visit (HOSPITAL_COMMUNITY): Payer: Self-pay

## 2021-10-30 ENCOUNTER — Other Ambulatory Visit: Payer: Self-pay

## 2021-10-31 ENCOUNTER — Other Ambulatory Visit (HOSPITAL_COMMUNITY): Payer: Self-pay

## 2021-10-31 ENCOUNTER — Other Ambulatory Visit: Payer: Self-pay

## 2021-11-04 ENCOUNTER — Other Ambulatory Visit (HOSPITAL_COMMUNITY): Payer: Self-pay

## 2021-11-07 ENCOUNTER — Other Ambulatory Visit (HOSPITAL_COMMUNITY): Payer: Self-pay

## 2021-11-12 ENCOUNTER — Other Ambulatory Visit (HOSPITAL_COMMUNITY): Payer: Self-pay

## 2021-12-02 ENCOUNTER — Other Ambulatory Visit (HOSPITAL_COMMUNITY): Payer: Self-pay

## 2021-12-02 ENCOUNTER — Other Ambulatory Visit: Payer: Self-pay

## 2021-12-03 ENCOUNTER — Encounter (HOSPITAL_COMMUNITY): Payer: Self-pay

## 2021-12-03 ENCOUNTER — Telehealth (HOSPITAL_COMMUNITY): Payer: Self-pay | Admitting: *Deleted

## 2021-12-03 NOTE — Telephone Encounter (Signed)
Call attempted to confirm HV TOC appt 11 om on 12/03/21. HIPPA appropriate VM left with callback number.    Rhae Hammock, BSN, Scientist, clinical (histocompatibility and immunogenetics) Only

## 2022-03-16 ENCOUNTER — Encounter (HOSPITAL_COMMUNITY): Payer: Self-pay | Admitting: Emergency Medicine

## 2022-03-16 ENCOUNTER — Emergency Department (HOSPITAL_COMMUNITY)
Admission: EM | Admit: 2022-03-16 | Discharge: 2022-03-16 | Disposition: A | Discharge status: Home / Self Care | Payer: Medicaid Other | Attending: Emergency Medicine | Admitting: Emergency Medicine

## 2022-03-16 ENCOUNTER — Other Ambulatory Visit: Payer: Self-pay

## 2022-03-16 ENCOUNTER — Emergency Department (HOSPITAL_COMMUNITY): Payer: Medicaid Other

## 2022-03-16 DIAGNOSIS — R1013 Epigastric pain: Secondary | ICD-10-CM | POA: Diagnosis not present

## 2022-03-16 DIAGNOSIS — R600 Localized edema: Secondary | ICD-10-CM | POA: Insufficient documentation

## 2022-03-16 DIAGNOSIS — F1410 Cocaine abuse, uncomplicated: Secondary | ICD-10-CM | POA: Insufficient documentation

## 2022-03-16 DIAGNOSIS — R0789 Other chest pain: Secondary | ICD-10-CM | POA: Diagnosis present

## 2022-03-16 DIAGNOSIS — F17210 Nicotine dependence, cigarettes, uncomplicated: Secondary | ICD-10-CM | POA: Diagnosis not present

## 2022-03-16 DIAGNOSIS — I10 Essential (primary) hypertension: Secondary | ICD-10-CM | POA: Insufficient documentation

## 2022-03-16 DIAGNOSIS — Z79899 Other long term (current) drug therapy: Secondary | ICD-10-CM | POA: Insufficient documentation

## 2022-03-16 LAB — CBC
HCT: 44.5 % (ref 36.0–46.0)
Hemoglobin: 14.9 g/dL (ref 12.0–15.0)
MCH: 30.7 pg (ref 26.0–34.0)
MCHC: 33.5 g/dL (ref 30.0–36.0)
MCV: 91.6 fL (ref 80.0–100.0)
Platelets: 267 10*3/uL (ref 150–400)
RBC: 4.86 MIL/uL (ref 3.87–5.11)
RDW: 14.8 % (ref 11.5–15.5)
WBC: 7 10*3/uL (ref 4.0–10.5)
nRBC: 0.3 % — ABNORMAL HIGH (ref 0.0–0.2)

## 2022-03-16 LAB — HEPATIC FUNCTION PANEL
ALT: 124 U/L — ABNORMAL HIGH (ref 0–44)
AST: 104 U/L — ABNORMAL HIGH (ref 15–41)
Albumin: 3.1 g/dL — ABNORMAL LOW (ref 3.5–5.0)
Alkaline Phosphatase: 230 U/L — ABNORMAL HIGH (ref 38–126)
Bilirubin, Direct: 0.2 mg/dL (ref 0.0–0.2)
Indirect Bilirubin: 0.4 mg/dL (ref 0.3–0.9)
Total Bilirubin: 0.6 mg/dL (ref 0.3–1.2)
Total Protein: 7.1 g/dL (ref 6.5–8.1)

## 2022-03-16 LAB — BASIC METABOLIC PANEL
Anion gap: 12 (ref 5–15)
BUN: 19 mg/dL (ref 6–20)
CO2: 24 mmol/L (ref 22–32)
Calcium: 8.8 mg/dL — ABNORMAL LOW (ref 8.9–10.3)
Chloride: 101 mmol/L (ref 98–111)
Creatinine, Ser: 1.33 mg/dL — ABNORMAL HIGH (ref 0.44–1.00)
GFR, Estimated: 46 mL/min — ABNORMAL LOW (ref >60)
Glucose, Bld: 245 mg/dL — ABNORMAL HIGH (ref 70–99)
Potassium: 3.2 mmol/L — ABNORMAL LOW (ref 3.5–5.1)
Sodium: 137 mmol/L (ref 135–145)

## 2022-03-16 LAB — TROPONIN I (HIGH SENSITIVITY)
Troponin I (High Sensitivity): 46 ng/L — ABNORMAL HIGH (ref <18)
Troponin I (High Sensitivity): 55 ng/L — ABNORMAL HIGH (ref <18)

## 2022-03-16 LAB — LIPASE, BLOOD: Lipase: 28 U/L (ref 11–51)

## 2022-03-16 MED ORDER — AMLODIPINE BESYLATE 10 MG PO TABS: 10.0000 mg | ORAL_TABLET | Freq: Every day | ORAL | 2 refills | Status: DC

## 2022-03-16 MED ORDER — AMLODIPINE BESYLATE 10 MG PO TABS: 10.0000 mg | ORAL_TABLET | Freq: Every day | ORAL | 0 refills | Status: DC

## 2022-03-16 NOTE — ED Provider Triage Note (Signed)
Emergency Medicine Provider Triage Evaluation Note  Amanda [name] , a 61 y.o. female  was evaluated in triage.  Pt complains of chest pain which has been ongoing for approximately 1 month.  She states she has not tried to get care previously due to waiting for Medicaid to be activated.  She denies nausea, vomiting but endorses shortness of breath.  The pain is substernal in nature.  Patient currently rates it 6 out of 10 in severity.  She denies radiation of symptoms.  Review of Systems  Positive: As above Negative: As above  Physical Exam  LMP 12/24/2015 (Within Days)  Gen:   Awake, no distress   Resp:  Normal effort  MSK:   Moves extremities without difficulty  Other:    Medical Decision Making  Medically screening exam initiated at 5:24 PM.  Appropriate orders placed.  LESLEE SUIRE was informed that the remainder of the evaluation will be completed by another provider, this initial triage assessment does not replace that evaluation, and the importance of remaining in the ED until their evaluation is complete.     Darrick Grinder, PA-C 03/16/22 1730

## 2022-03-16 NOTE — ED Triage Notes (Signed)
Pt endorses central CP for a month. Pain worse with exertion. Pt hasn't been on HTN meds for years.

## 2022-03-16 NOTE — ED Provider Notes (Signed)
[facility] EMERGENCY DEPARTMENT Provider Note   CSN: [id number] Arrival date & time: 03/16/22  1721     History  Chief Complaint  Patient presents with   Chest Pain    Amanda [name] is a 61 y.o. female with PMH HTN and cocaine abuse who presents to ED c/o intermittent, substernal chest pain that occurs only when exerting herself and lasts a few seconds per episode. Pt states pain resides when she is at rest. Pain is not radiating, is not severe, and has not changed in characteristics or frequency since onset over a month ago. Pt states she has not treated her HTN in many years and for ~1 month her systolic blood pressure has been in the 200s when she checks it at home. She has occasional, bilateral, mild frontal headaches but denies shortness of breath, nausea, vomiting, abdominal pain, dizziness, syncope, palpitations, focal weakness, paresthesias, confusion, chest injury, or any other symptoms. Previously, she was on amlodipine 10mg  and losartan 50mg  and reports good blood pressure control on those medications. Reports she had to stop taking her medications and seeing her PCP as she lost her insurance and could no longer afford medications or office visits. Reports she now has Medicaid and will be able to follow-up in an outpatient setting with her old primary care provider. Denies personal or family history of heart disease, heart failure, arrhythmia, or stroke. Does admit to being a current 0.5 PPD tobacco smoker and occasional use of cocaine (about once a week) but denies using prior to visit today.     Home Medications Prior to Admission medications   Medication Sig Start Date End Date Taking? Authorizing Provider  acetaminophen (TYLENOL) 500 MG tablet Take 1,000 mg by mouth every 6 (six) hours as needed for moderate pain or headache.    [provider]  amLODipine (NORVASC) 10 MG tablet Take 1 tablet (10 mg total) by mouth daily. 03/16/22 06/14/22  Raya Mckinstry,  Diani Jillson L, PA-C  empagliflozin (JARDIANCE) 10 MG TABS tablet Take 1 tablet (10 mg total) by mouth daily. 09/11/21   08/14/22, MD  losartan (COZAAR) 50 MG tablet Take 1 tablet (50 mg total) by mouth daily. 09/10/21 01/08/22  09/12/21, MD  nitroGLYCERIN (NITROSTAT) 0.4 MG SL tablet Place 1 tablet (0.4 mg total) under the tongue every 5 (five) minutes as needed for chest pain (BLOOD PRESSURES GREATER THAN 180 systolic.). 09/10/21   Cipriano Bunker, MD      Allergies    Patient has no known allergies.    Review of Systems   Review of Systems  Constitutional:  Negative for activity change, appetite change, chills, fever and unexpected weight change.  HENT:  Negative for congestion, ear pain, rhinorrhea and sore throat.   Eyes:  Negative for photophobia, pain and visual disturbance.  Respiratory:  Negative for apnea, cough, choking, chest tightness, shortness of breath and wheezing.   Cardiovascular:  Positive for chest pain and leg swelling. Negative for palpitations.  Gastrointestinal:  Negative for abdominal distention, abdominal pain, constipation, diarrhea, nausea and vomiting.  Genitourinary:  Negative for difficulty urinating, dysuria and hematuria.  Musculoskeletal:  Negative for arthralgias, back pain and gait problem.  Skin:  Negative for color change and rash.  Neurological:  Positive for headaches. Negative for dizziness, tremors, seizures, syncope, facial asymmetry, speech difficulty, weakness, light-headedness and numbness.  All other systems reviewed and are negative.   Physical Exam Updated Vital Signs BP (!) 152/139   Pulse (!) 54  Temp 97.8 F (36.6 C)   Resp (!) 25   Ht 5\' 4"  (1.626 m)   Wt 61.2 kg   LMP 12/24/2015 (Within Days)   SpO2 99%   BMI 23.17 kg/m  Physical Exam Vitals and nursing note reviewed.  Constitutional:      General: She is not in acute distress.    Appearance: Normal appearance. She is not toxic-appearing.  HENT:     Head: Normocephalic  and atraumatic.     Mouth/Throat:     Mouth: Mucous membranes are moist.  Eyes:     Extraocular Movements: Extraocular movements intact.     Conjunctiva/sclera: Conjunctivae normal.     Pupils: Pupils are equal, round, and reactive to light.  Cardiovascular:     Rate and Rhythm: Regular rhythm. Tachycardia present.     Heart sounds: Normal heart sounds. No murmur heard.    No S3 or S4 sounds.  Pulmonary:     Effort: Pulmonary effort is normal. No respiratory distress.     Breath sounds: Normal breath sounds. No decreased breath sounds or wheezing.  Chest:     Chest wall: Tenderness present. No deformity or edema. Dull to percussion: moderate anterior diffuse.  Abdominal:     General: Abdomen is flat. Bowel sounds are normal.     Palpations: Abdomen is soft. There is no fluid wave.     Tenderness: There is abdominal tenderness (mild epigastric, no tenderness to remainder of abdomen). There is no guarding or rebound.  Musculoskeletal:        General: Normal range of motion.     Cervical back: Normal range of motion and neck supple.     Right lower leg: Edema (1+ pitting) present.     Left lower leg: Edema (1+ pitting) present.  Skin:    General: Skin is warm.     Capillary Refill: Capillary refill takes less than 2 seconds.  Neurological:     General: No focal deficit present.     Mental Status: She is alert and oriented to person, place, and time. Mental status is at baseline.     Cranial Nerves: No cranial nerve deficit, dysarthria or facial asymmetry.     Sensory: Sensation is intact.     Motor: Motor function is intact.     Coordination: Coordination is intact.  Psychiatric:        Mood and Affect: Mood normal.        Behavior: Behavior normal.     ED Results / Procedures / Treatments   Labs (all labs ordered are listed, but only abnormal results are displayed) Labs Reviewed  BASIC METABOLIC PANEL - Abnormal; Notable for the following components:      Result Value    Potassium 3.2 (*)    Glucose, Bld 245 (*)    Creatinine, Ser 1.33 (*)    Calcium 8.8 (*)    GFR, Estimated 46 (*)    All other components within normal limits  CBC - Abnormal; Notable for the following components:   nRBC 0.3 (*)    All other components within normal limits  HEPATIC FUNCTION PANEL - Abnormal; Notable for the following components:   Albumin 3.1 (*)    AST 104 (*)    ALT 124 (*)    Alkaline Phosphatase 230 (*)    All other components within normal limits  TROPONIN I (HIGH SENSITIVITY) - Abnormal; Notable for the following components:   Troponin I (High Sensitivity) 46 (*)    All  other components within normal limits  TROPONIN I (HIGH SENSITIVITY) - Abnormal; Notable for the following components:   Troponin I (High Sensitivity) 55 (*)    All other components within normal limits  LIPASE, BLOOD  URINALYSIS, ROUTINE W REFLEX MICROSCOPIC    EKG EKG Interpretation  Date/Time:  Monday March 16 2022 17:11:35 EST Ventricular Rate:  109 PR Interval:    QRS Duration: 78 QT Interval:  396 QTC Calculation: 533 R Axis:   108 Text Interpretation: Normal sinus rhythm Rightward axis Biventricular hypertrophy with repolarization abnormality Prolonged QT Abnormal ECG Otherwise no significant change Confirmed by Melene Plan [phone]) on 03/16/2022 9:28:14 PM  Radiology DG Chest 2 View  Result Date: 03/16/2022 CLINICAL DATA:  Chest pain EXAM: CHEST - 2 VIEW COMPARISON:  09/08/2021 FINDINGS: Unchanged cardiomegaly. Improved previously noted interstitial pulmonary edema. No new focal pulmonary opacity. No pleural effusion or pneumothorax. Previously noted trace right pleural effusion is no longer seen. No acute osseous abnormality. IMPRESSION: Unchanged cardiomegaly with improved previously noted interstitial pulmonary edema. Electronically Signed   By: Wiliam Ke M.D.   On: 03/16/2022 18:58    Procedures None  Medications Ordered in ED Medications - No data to display  ED  Course/ Medical Decision Making/ A&P Clinical Course as of 03/16/22 2325  Mon Mar 16, 2022  2138 Troponin I (High Sensitivity)(!): 46 [MG]    Clinical Course User Index [MG] Tonette Lederer, PA-C                           Medical Decision Making Amount and/or Complexity of Data Reviewed Labs: ordered. Decision-making details documented in ED Course. Radiology: ordered. Decision-making details documented in ED Course. ECG/medicine tests: ordered. Decision-making details documented in ED Course.  Risk Prescription drug management. Decision regarding hospitalization.   HEART Score for Major Cardiac Events from [email or web address]  on 03/16/2022 ** All calculations should be rechecked by clinician prior to use **  RESULT SUMMARY: 4 points Moderate Score (4-6 points)  Risk of MACE of 12-16.6%.  If troponin is positive, many experts recommend further workup and admission even with a low HEART Score.   INPUTS: History --> 0 = Slightly suspicious EKG --> 0 = Normal Age --> 1 = 45-64 Risk factors --> 2 = ?3 risk factors or history of atherosclerotic disease Initial troponin --> 1 = 1-3 normal limit   Patient with persistently elevated blood pressures at recent ED visits and at home due to chronic non-adherence to anti-hypertensives. Neurologically intact. Mild tachycardia in the high 90s to low 100s but EKG with no acute ST-T changes. Initial troponin mildly elevated at 46 but suspect this is also chronic when compared to prior troponins in the 100s during prior admission in May of this year. Mild change in troponin when repeated, do not believe this is consistent with ACS. Pt is currently asymptomatic and has obtained insurance coverage stating she will be able to attend recommended outpatient follow-up and restart her anti-hypertensives as recommended. Stressed importance of chronic control of her blood pressure to prevent future end-organ damage and improve her quality of life. Pt receptive  to conversation agrees to be more adamant about managing her chronic hypertension. Does chronically abuse cocaine as well and aware of effects this will have on her blood pressure. Cocaine and tobacco cessation counseling provided. Baseline cardiomegaly on CXR with no signs of pulmonary edema and lungs clear to auscultation on exam. Essentially unremarkable workup thus does not meet  criteria for hypertensive emergency and will not proceed with aggressive reduction of blood pressure in ED or admission to hospital. Strict return precautions given. Pt also evaluated by attending physician who agreed with management. Mild epigastric tenderness one exam so hepatic panel and lipase ordered for further assessment but do not suspect this is related to pt's uncontrolled blood pressure or recurrent chest pain.        Final Clinical Impression(s) / ED Diagnoses Final diagnoses:  Chest wall pain  Uncontrolled hypertension  Cocaine abuse (HCC)    Rx / DC Orders ED Discharge Orders          Ordered    amLODipine (NORVASC) 10 MG tablet  Daily,   Status:  Discontinued        03/16/22 2313    amLODipine (NORVASC) 10 MG tablet  Daily        03/16/22 2324              Tonette LedererGowens, Evamarie Raetz L, PA-C 03/16/22 2325    Melene PlanFloyd, Dan, DO 03/19/22 [phone]

## 2022-03-16 NOTE — ED Notes (Signed)
Patient verbalizes understanding of discharge instructions. Opportunity for questioning and answers were provided. Armband removed by staff, pt discharged from ED. Pt taken to ED waiting room via wheel chair.  

## 2022-03-16 NOTE — Discharge Instructions (Addendum)
Thank you for letting us care for you today.  As discussed, it is very important you better manage your blood pressure after discharge from the emergency department.   You should follow-up with your PCP (or one of the clinics provided) to discuss what medications are best to control your blood pressure and so they can regularly check your blood pressure to make sure you are responding well.  Take the medication(s) prescribed to you today as instructed. Follow-up with your primary care provider as soon as possible. Take your blood pressure once a day and keep a log of this to provide to your PCP at your appointment.  If you develop any new or other concerning symptoms, it is important you return to the ED immediately for re-evaluation or consult with your PCP for further recommendations.

## 2022-03-17 ENCOUNTER — Emergency Department (HOSPITAL_COMMUNITY): Payer: Medicaid Other

## 2022-03-17 ENCOUNTER — Encounter (HOSPITAL_COMMUNITY): Payer: Self-pay | Admitting: Emergency Medicine

## 2022-03-17 ENCOUNTER — Inpatient Hospital Stay (HOSPITAL_COMMUNITY)
Admission: EM | Admit: 2022-03-17 | Discharge: 2022-03-26 | DRG: 291 | Disposition: A | Discharge status: Home Health | Payer: Medicaid Other | Attending: Internal Medicine | Admitting: Internal Medicine

## 2022-03-17 DIAGNOSIS — Z556 Problems related to health literacy: Secondary | ICD-10-CM

## 2022-03-17 DIAGNOSIS — Z91199 Patient's noncompliance with other medical treatment and regimen due to unspecified reason: Secondary | ICD-10-CM

## 2022-03-17 DIAGNOSIS — I5032 Chronic diastolic (congestive) heart failure: Secondary | ICD-10-CM | POA: Diagnosis present

## 2022-03-17 DIAGNOSIS — F1011 Alcohol abuse, in remission: Secondary | ICD-10-CM | POA: Diagnosis present

## 2022-03-17 DIAGNOSIS — I161 Hypertensive emergency: Secondary | ICD-10-CM | POA: Diagnosis present

## 2022-03-17 DIAGNOSIS — I5043 Acute on chronic combined systolic (congestive) and diastolic (congestive) heart failure: Secondary | ICD-10-CM | POA: Diagnosis present

## 2022-03-17 DIAGNOSIS — Z5320 Procedure and treatment not carried out because of patient's decision for unspecified reasons: Secondary | ICD-10-CM | POA: Diagnosis present

## 2022-03-17 DIAGNOSIS — Z8249 Family history of ischemic heart disease and other diseases of the circulatory system: Secondary | ICD-10-CM

## 2022-03-17 DIAGNOSIS — I509 Heart failure, unspecified: Principal | ICD-10-CM

## 2022-03-17 DIAGNOSIS — F14221 Cocaine dependence with intoxication delirium: Secondary | ICD-10-CM | POA: Diagnosis present

## 2022-03-17 DIAGNOSIS — Z91148 Patient's other noncompliance with medication regimen for other reason: Secondary | ICD-10-CM

## 2022-03-17 DIAGNOSIS — N1831 Chronic kidney disease, stage 3a: Secondary | ICD-10-CM | POA: Diagnosis present

## 2022-03-17 DIAGNOSIS — I5042 Chronic combined systolic (congestive) and diastolic (congestive) heart failure: Secondary | ICD-10-CM | POA: Diagnosis present

## 2022-03-17 DIAGNOSIS — Z79899 Other long term (current) drug therapy: Secondary | ICD-10-CM

## 2022-03-17 DIAGNOSIS — F1410 Cocaine abuse, uncomplicated: Secondary | ICD-10-CM | POA: Diagnosis present

## 2022-03-17 DIAGNOSIS — F10131 Alcohol abuse with withdrawal delirium: Secondary | ICD-10-CM | POA: Diagnosis present

## 2022-03-17 DIAGNOSIS — R7989 Other specified abnormal findings of blood chemistry: Secondary | ICD-10-CM | POA: Diagnosis present

## 2022-03-17 DIAGNOSIS — I5033 Acute on chronic diastolic (congestive) heart failure: Secondary | ICD-10-CM | POA: Diagnosis present

## 2022-03-17 DIAGNOSIS — I130 Hypertensive heart and chronic kidney disease with heart failure and stage 1 through stage 4 chronic kidney disease, or unspecified chronic kidney disease: Principal | ICD-10-CM | POA: Diagnosis present

## 2022-03-17 DIAGNOSIS — F17210 Nicotine dependence, cigarettes, uncomplicated: Secondary | ICD-10-CM | POA: Diagnosis present

## 2022-03-17 LAB — CBC
HCT: 44.2 % (ref 36.0–46.0)
Hemoglobin: 14.8 g/dL (ref 12.0–15.0)
MCH: 30.4 pg (ref 26.0–34.0)
MCHC: 33.5 g/dL (ref 30.0–36.0)
MCV: 90.8 fL (ref 80.0–100.0)
Platelets: 282 10*3/uL (ref 150–400)
RBC: 4.87 MIL/uL (ref 3.87–5.11)
RDW: 15 % (ref 11.5–15.5)
WBC: 7 10*3/uL (ref 4.0–10.5)
nRBC: 0.3 % — ABNORMAL HIGH (ref 0.0–0.2)

## 2022-03-17 LAB — BASIC METABOLIC PANEL
Anion gap: 9 (ref 5–15)
BUN: 17 mg/dL (ref 6–20)
CO2: 23 mmol/L (ref 22–32)
Calcium: 8.7 mg/dL — ABNORMAL LOW (ref 8.9–10.3)
Chloride: 104 mmol/L (ref 98–111)
Creatinine, Ser: 1.12 mg/dL — ABNORMAL HIGH (ref 0.44–1.00)
GFR, Estimated: 56 mL/min — ABNORMAL LOW (ref >60)
Glucose, Bld: 211 mg/dL — ABNORMAL HIGH (ref 70–99)
Potassium: 3.4 mmol/L — ABNORMAL LOW (ref 3.5–5.1)
Sodium: 136 mmol/L (ref 135–145)

## 2022-03-17 LAB — TROPONIN I (HIGH SENSITIVITY)
Troponin I (High Sensitivity): 53 ng/L — ABNORMAL HIGH (ref <18)
Troponin I (High Sensitivity): 54 ng/L — ABNORMAL HIGH (ref <18)

## 2022-03-17 LAB — BRAIN NATRIURETIC PEPTIDE: B Natriuretic Peptide: 1525.6 pg/mL — ABNORMAL HIGH (ref 0.0–100.0)

## 2022-03-17 MED ORDER — NITROGLYCERIN IN D5W 200-5 MCG/ML-% IV SOLN
0.0000 ug/min | INTRAVENOUS | Status: DC
  Administered 2022-03-17: 5 ug/min via INTRAVENOUS
  Filled 2022-03-17 (×2): qty 250

## 2022-03-17 MED ORDER — NITROGLYCERIN 0.4 MG SL SUBL
0.4000 mg | SUBLINGUAL_TABLET | Freq: Once | SUBLINGUAL | Status: AC
  Administered 2022-03-17: 0.4 mg via SUBLINGUAL
  Filled 2022-03-17: qty 1

## 2022-03-17 MED ORDER — FUROSEMIDE 10 MG/ML IJ SOLN
60.0000 mg | Freq: Once | INTRAMUSCULAR | Status: AC
  Administered 2022-03-17: 60 mg via INTRAVENOUS
  Filled 2022-03-17: qty 6

## 2022-03-17 MED ORDER — IPRATROPIUM-ALBUTEROL 0.5-2.5 (3) MG/3ML IN SOLN
3.0000 mL | Freq: Once | RESPIRATORY_TRACT | Status: AC
  Administered 2022-03-17: 3 mL via RESPIRATORY_TRACT
  Filled 2022-03-17: qty 3

## 2022-03-17 MED ORDER — LORAZEPAM 1 MG PO TABS
1.0000 mg | ORAL_TABLET | Freq: Once | ORAL | Status: AC
  Administered 2022-03-17: 1 mg via ORAL
  Filled 2022-03-17: qty 1

## 2022-03-17 MED ORDER — FUROSEMIDE 10 MG/ML IJ SOLN
40.0000 mg | Freq: Once | INTRAMUSCULAR | Status: AC
  Administered 2022-03-17: 40 mg via INTRAVENOUS
  Filled 2022-03-17: qty 4

## 2022-03-17 NOTE — ED Notes (Signed)
Patient complaining of worsening shob. Patient taken to triage for reassessment and repeat ekg

## 2022-03-17 NOTE — ED Provider Notes (Signed)
Patient returns to triage with worsening chest pain or shortness of breath.  States last cocaine use several weeks ago.  Repeat EKG is unchanged without acute ST elevation.  She is tachypneic, and hypertensive. No hypoxia.  Chest x-ray concerning for vascular congestion.  Patient was given Ativan, DuoNeb and Lasix.  Will prioritize room placement   Glynn Octave, MD 03/17/22 [phone]

## 2022-03-17 NOTE — ED Triage Notes (Signed)
Pt in triage room states this rn "snatched her down the hall yesterday." Also reports her symptoms are same as yesterday and we did nothing for her.

## 2022-03-17 NOTE — ED Provider Triage Note (Signed)
Emergency Medicine Provider Triage Evaluation Note  Amanda [name] , a 61 y.o. female  was evaluated in triage.  Pt complains of chest pain.  This has been ongoing for about 1 month.  She was seen yesterday and was discharged home.  Symptoms have persisted since then.  She claims nothing was done yesterday.  Review of Systems  Positive:  Negative:   Physical Exam  BP (!) 195/131 (BP Location: Right Arm)   Pulse (!) 59   Temp (!) 97.4 F (36.3 C)   Resp 16   LMP 12/24/2015 (Within Days)   SpO2 100%  Gen:   Awake, no distress   Resp:  Normal effort  MSK:   Moves extremities without difficulty  Other:    Medical Decision Making  Medically screening exam initiated at 4:01 PM.  Appropriate orders placed.  Amanda [name] was informed that the remainder of the evaluation will be completed by another provider, this initial triage assessment does not replace that evaluation, and the importance of remaining in the ED until their evaluation is complete.  Hypertensive.   Claudie Leach, [place] 03/17/22 1603

## 2022-03-17 NOTE — ED Provider Notes (Signed)
[facility] Provider Note   CSN: [id number] Arrival date & time: 03/17/22  1436     History {Add pertinent medical, surgical, social history, OB history to HPI:1} Chief Complaint  Patient presents with   Chest Pain    Amanda [name] is a 61 y.o. female.  61 year old female with past medical history of hypertension, cocaine abuse, CHF presents today for evaluation of significantly elevated blood pressure, shortness of breath, chest pain symptoms ongoing for 2 weeks and progressively worsening.  She was seen in the emergency room yesterday and ultimately discharged.  She returns today for worsening symptoms.  States she is not currently on any medications.  Denies recent cocaine abuse.  The history is provided by the patient. No language interpreter was used.       Home Medications Prior to Admission medications   Medication Sig Start Date End Date Taking? Authorizing Provider  acetaminophen (TYLENOL) 500 MG tablet Take 1,000 mg by mouth every 6 (six) hours as needed for moderate pain or headache.    [provider]  amLODipine (NORVASC) 10 MG tablet Take 1 tablet (10 mg total) by mouth daily. 03/16/22 06/14/22  Gowens, Mariah L, PA-C  empagliflozin (JARDIANCE) 10 MG TABS tablet Take 1 tablet (10 mg total) by mouth daily. 09/11/21   Cipriano BunkerKumar, Pardeep, MD  losartan (COZAAR) 50 MG tablet Take 1 tablet (50 mg total) by mouth daily. 09/10/21 01/08/22  Cipriano BunkerKumar, Pardeep, MD  nitroGLYCERIN (NITROSTAT) 0.4 MG SL tablet Place 1 tablet (0.4 mg total) under the tongue every 5 (five) minutes as needed for chest pain (BLOOD PRESSURES GREATER THAN 180 systolic.). 09/10/21   Cipriano BunkerKumar, Pardeep, MD      Allergies    Patient has no known allergies.    Review of Systems   Review of Systems  Constitutional:  Negative for chills and fever.  Respiratory:  Positive for shortness of breath.   Cardiovascular:  Positive for chest pain. Negative for palpitations and leg  swelling.  Gastrointestinal:  Negative for abdominal pain, nausea and vomiting.  Neurological:  Negative for light-headedness.  All other systems reviewed and are negative.   Physical Exam Updated Vital Signs BP (!) 183/129   Pulse (!) 57   Temp 97.9 F (36.6 C) (Oral)   Resp (!) 28   LMP 12/24/2015 (Within Days)   SpO2 96%  Physical Exam Vitals and nursing note reviewed.  Constitutional:      General: She is not in acute distress.    Appearance: Normal appearance. She is ill-appearing.  HENT:     Head: Normocephalic and atraumatic.     Nose: Nose normal.  Eyes:     General: No scleral icterus.    Extraocular Movements: Extraocular movements intact.     Conjunctiva/sclera: Conjunctivae normal.  Cardiovascular:     Rate and Rhythm: Normal rate and regular rhythm.     Pulses: Normal pulses.  Pulmonary:     Effort: Pulmonary effort is normal. No respiratory distress.     Breath sounds: Normal breath sounds. No wheezing or rales.  Abdominal:     General: There is distension.     Tenderness: There is no abdominal tenderness. There is no guarding.  Musculoskeletal:        General: Normal range of motion.     Cervical back: Normal range of motion.     Right lower leg: Edema (trace pitting edema) present.     Left lower leg: Edema (trace pitting edema) present.  Skin:    General: Skin is warm and dry.  Neurological:     General: No focal deficit present.     Mental Status: She is alert. Mental status is at baseline.     ED Results / Procedures / Treatments   Labs (all labs ordered are listed, but only abnormal results are displayed) Labs Reviewed  BASIC METABOLIC PANEL - Abnormal; Notable for the following components:      Result Value   Potassium 3.4 (*)    Glucose, Bld 211 (*)    Creatinine, Ser 1.12 (*)    Calcium 8.7 (*)    GFR, Estimated 56 (*)    All other components within normal limits  CBC - Abnormal; Notable for the following components:   nRBC 0.3 (*)     All other components within normal limits  BRAIN NATRIURETIC PEPTIDE - Abnormal; Notable for the following components:   B Natriuretic Peptide 1,525.6 (*)    All other components within normal limits  TROPONIN I (HIGH SENSITIVITY) - Abnormal; Notable for the following components:   Troponin I (High Sensitivity) 53 (*)    All other components within normal limits  TROPONIN I (HIGH SENSITIVITY) - Abnormal; Notable for the following components:   Troponin I (High Sensitivity) 54 (*)    All other components within normal limits  HEPATIC FUNCTION PANEL    EKG EKG Interpretation  Date/Time:  Tuesday March 17 2022 19:31:08 EST Ventricular Rate:  59 PR Interval:  134 QRS Duration: 78 QT Interval:  448 QTC Calculation: 443 R Axis:   80 Text Interpretation: Sinus bradycardia Biatrial enlargement Left ventricular hypertrophy with repolarization abnormality ( Sokolow-Lyon , Romhilt-Estes ) Abnormal ECG When compared with ECG of 17-Mar-2022 15:55, PREVIOUS ECG IS PRESENT No significant change was found Confirmed by Glynn Octave [phone]) on 03/17/2022 7:36:08 PM  Radiology DG Chest 1 View  Result Date: 03/17/2022 CLINICAL DATA:  Chest pain and shortness of breath. EXAM: CHEST  1 VIEW COMPARISON:  Chest x-ray from 2017 and chest CT 09/09/2021 FINDINGS: Prominent cardiopericardial silhouette, likely combination of cardiac enlargement and pericardial effusion as seen on prior chest CT. Central vascular congestion and interstitial pulmonary edema. No pleural effusions or focal infiltrates. The bony thorax is intact. IMPRESSION: 1. Cardiac enlargement. 2. CHF. Electronically Signed   By: Rudie Meyer M.D.   On: 03/17/2022 16:49   DG Chest 2 View  Result Date: 03/16/2022 CLINICAL DATA:  Chest pain EXAM: CHEST - 2 VIEW COMPARISON:  09/08/2021 FINDINGS: Unchanged cardiomegaly. Improved previously noted interstitial pulmonary edema. No new focal pulmonary opacity. No pleural effusion or  pneumothorax. Previously noted trace right pleural effusion is no longer seen. No acute osseous abnormality. IMPRESSION: Unchanged cardiomegaly with improved previously noted interstitial pulmonary edema. Electronically Signed   By: Wiliam Ke M.D.   On: 03/16/2022 18:58    Procedures Procedures  {Document cardiac monitor, telemetry assessment procedure when appropriate:1}  Medications Ordered in ED Medications  furosemide (LASIX) injection 60 mg (has no administration in time range)  nitroGLYCERIN 50 mg in dextrose 5 % 250 mL (0.2 mg/mL) infusion (has no administration in time range)  LORazepam (ATIVAN) tablet 1 mg (1 mg Oral Given 03/17/22 2022)  ipratropium-albuterol (DUONEB) 0.5-2.5 (3) MG/3ML nebulizer solution 3 mL (3 mLs Nebulization Given 03/17/22 2022)  furosemide (LASIX) injection 40 mg (40 mg Intravenous Given 03/17/22 2022)  nitroGLYCERIN (NITROSTAT) SL tablet 0.4 mg (0.4 mg Sublingual Given 03/17/22 2222)    ED Course/ Medical Decision Making/ A&P  Medical Decision Making Amount and/or Complexity of Data Reviewed Labs: ordered.  Risk Prescription drug management.   Medical Decision Making / ED Course   This patient presents to the ED for concern of chest pain, shortness of breath , this involves an extensive number of treatment options, and is a complaint that carries with it a high risk of complications and morbidity.  The differential diagnosis includes hypertensive emergency, CHF exacerbation, hypertensive urgency, ACS, PE  MDM: 61 year old female with uncontrolled hypertension, CHF presents today for evaluation of chest pain, shortness of breath.  Was seen yesterday but ultimately discharged.  She returns for persistent symptoms.  Hypertensive at about 210/110s.  Chest pain resolved after sublingual nitro.  Chest x-ray shows evidence of volume overload.  Mild increase in work of breathing.  Significant orthopnea present on exam.  Without  hypoxia.  Troponin elevated but flat.  BNP of 1500 which is slightly elevated compared to 6 months ago.  Renal function preserved.  Glucose of 211.  CBC unremarkable.  We will add on hepatic function panel.  EKG without acute ischemic changes.  Lasix, nitro drip, BiPAP ordered.  BiPAP for positive pressure.  No respiratory distress or hypoxia.  Patient later refused BiPAP with RT. following sublingual nitro BP improved somewhat.  We will continue with nitro drip.  Will discuss with hospitalist for admission.   Additional history obtained: -Additional history obtained from *** -External records from outside source obtained and reviewed including: Chart review including previous notes, labs, imaging, consultation notes   Lab Tests: -I ordered, reviewed, and interpreted labs.   The pertinent results include:   Labs Reviewed  BASIC METABOLIC PANEL - Abnormal; Notable for the following components:      Result Value   Potassium 3.4 (*)    Glucose, Bld 211 (*)    Creatinine, Ser 1.12 (*)    Calcium 8.7 (*)    GFR, Estimated 56 (*)    All other components within normal limits  CBC - Abnormal; Notable for the following components:   nRBC 0.3 (*)    All other components within normal limits  BRAIN NATRIURETIC PEPTIDE - Abnormal; Notable for the following components:   B Natriuretic Peptide 1,525.6 (*)    All other components within normal limits  TROPONIN I (HIGH SENSITIVITY) - Abnormal; Notable for the following components:   Troponin I (High Sensitivity) 53 (*)    All other components within normal limits  TROPONIN I (HIGH SENSITIVITY) - Abnormal; Notable for the following components:   Troponin I (High Sensitivity) 54 (*)    All other components within normal limits  HEPATIC FUNCTION PANEL      EKG  EKG Interpretation  Date/Time:  Tuesday March 17 2022 19:31:08 EST Ventricular Rate:  59 PR Interval:  134 QRS Duration: 78 QT Interval:  448 QTC Calculation: 443 R  Axis:   80 Text Interpretation: Sinus bradycardia Biatrial enlargement Left ventricular hypertrophy with repolarization abnormality ( Sokolow-Lyon , Romhilt-Estes ) Abnormal ECG When compared with ECG of 17-Mar-2022 15:55, PREVIOUS ECG IS PRESENT No significant change was found Confirmed by Glynn Octave [phone]) on 03/17/2022 7:36:08 PM         Imaging Studies ordered: I ordered imaging studies including *** I independently visualized and interpreted imaging. I agree with the radiologist interpretation   Medicines ordered and prescription drug management: Meds ordered this encounter  Medications   LORazepam (ATIVAN) tablet 1 mg   ipratropium-albuterol (DUONEB) 0.5-2.5 (3) MG/3ML nebulizer solution 3 mL  furosemide (LASIX) injection 40 mg   nitroGLYCERIN (NITROSTAT) SL tablet 0.4 mg   furosemide (LASIX) injection 60 mg   nitroGLYCERIN 50 mg in dextrose 5 % 250 mL (0.2 mg/mL) infusion    -I have reviewed the patients home medicines and have made adjustments as needed  Critical interventions ***  Consultations Obtained: I requested consultation with the ***,  and discussed lab and imaging findings as well as pertinent plan - they recommend: ***   Cardiac Monitoring: The patient was maintained on a cardiac monitor.  I personally viewed and interpreted the cardiac monitored which showed an underlying rhythm of: ***  Social Determinants of Health:  Factors impacting patients care include: ***   Reevaluation: After the interventions noted above, I reevaluated the patient and found that they have :{resolved/improved/worsened:23923::"improved"}  Co morbidities that complicate the patient evaluation  Past Medical History:  Diagnosis Date   Hypertension       Dispostion: ***     Final Clinical Impression(s) / ED Diagnoses Final diagnoses:  None     @PCDICTATION @   {Document critical care time when appropriate:1} {Document review of labs and clinical decision  tools ie heart score, Chads2Vasc2 etc:1}  {Document your independent review of radiology images, and any outside records:1} {Document your discussion with family members, caretakers, and with consultants:1} {Document social determinants of health affecting pt's care:1} {Document your decision making why or why not admission, treatments were needed:1} Final Clinical Impression(s) / ED Diagnoses Final diagnoses:  None    Rx / DC Orders ED Discharge Orders     None

## 2022-03-17 NOTE — ED Notes (Signed)
Pt refusing BIPAP, respirations labored, able to speak in full sentences, SPO2 98% RA. Kommor MD and RT aware

## 2022-03-17 NOTE — Progress Notes (Signed)
Hold BIPAP per MD at this time.

## 2022-03-18 ENCOUNTER — Encounter (HOSPITAL_COMMUNITY): Payer: Self-pay | Admitting: Family Medicine

## 2022-03-18 DIAGNOSIS — Z556 Problems related to health literacy: Secondary | ICD-10-CM | POA: Diagnosis not present

## 2022-03-18 DIAGNOSIS — I5042 Chronic combined systolic (congestive) and diastolic (congestive) heart failure: Secondary | ICD-10-CM | POA: Diagnosis present

## 2022-03-18 DIAGNOSIS — I130 Hypertensive heart and chronic kidney disease with heart failure and stage 1 through stage 4 chronic kidney disease, or unspecified chronic kidney disease: Secondary | ICD-10-CM | POA: Diagnosis present

## 2022-03-18 DIAGNOSIS — Z5320 Procedure and treatment not carried out because of patient's decision for unspecified reasons: Secondary | ICD-10-CM | POA: Diagnosis present

## 2022-03-18 DIAGNOSIS — Z79899 Other long term (current) drug therapy: Secondary | ICD-10-CM | POA: Diagnosis not present

## 2022-03-18 DIAGNOSIS — I5033 Acute on chronic diastolic (congestive) heart failure: Secondary | ICD-10-CM

## 2022-03-18 DIAGNOSIS — F14221 Cocaine dependence with intoxication delirium: Secondary | ICD-10-CM | POA: Diagnosis present

## 2022-03-18 DIAGNOSIS — F10131 Alcohol abuse with withdrawal delirium: Secondary | ICD-10-CM | POA: Diagnosis present

## 2022-03-18 DIAGNOSIS — Z8249 Family history of ischemic heart disease and other diseases of the circulatory system: Secondary | ICD-10-CM | POA: Diagnosis not present

## 2022-03-18 DIAGNOSIS — I161 Hypertensive emergency: Secondary | ICD-10-CM

## 2022-03-18 DIAGNOSIS — F1410 Cocaine abuse, uncomplicated: Secondary | ICD-10-CM | POA: Diagnosis not present

## 2022-03-18 DIAGNOSIS — N1831 Chronic kidney disease, stage 3a: Secondary | ICD-10-CM | POA: Diagnosis present

## 2022-03-18 DIAGNOSIS — F17210 Nicotine dependence, cigarettes, uncomplicated: Secondary | ICD-10-CM | POA: Diagnosis present

## 2022-03-18 DIAGNOSIS — I5032 Chronic diastolic (congestive) heart failure: Secondary | ICD-10-CM | POA: Diagnosis present

## 2022-03-18 DIAGNOSIS — I5043 Acute on chronic combined systolic (congestive) and diastolic (congestive) heart failure: Secondary | ICD-10-CM | POA: Diagnosis present

## 2022-03-18 DIAGNOSIS — Z91148 Patient's other noncompliance with medication regimen for other reason: Secondary | ICD-10-CM | POA: Diagnosis not present

## 2022-03-18 DIAGNOSIS — Z91199 Patient's noncompliance with other medical treatment and regimen due to unspecified reason: Secondary | ICD-10-CM | POA: Diagnosis not present

## 2022-03-18 DIAGNOSIS — R7989 Other specified abnormal findings of blood chemistry: Secondary | ICD-10-CM

## 2022-03-18 LAB — HEPATIC FUNCTION PANEL
ALT: 103 U/L — ABNORMAL HIGH (ref 0–44)
AST: 73 U/L — ABNORMAL HIGH (ref 15–41)
Albumin: 2.8 g/dL — ABNORMAL LOW (ref 3.5–5.0)
Alkaline Phosphatase: 183 U/L — ABNORMAL HIGH (ref 38–126)
Bilirubin, Direct: 0.2 mg/dL (ref 0.0–0.2)
Indirect Bilirubin: 0.5 mg/dL (ref 0.3–0.9)
Total Bilirubin: 0.7 mg/dL (ref 0.3–1.2)
Total Protein: 6.3 g/dL — ABNORMAL LOW (ref 6.5–8.1)

## 2022-03-18 LAB — CBC
HCT: 45.6 % (ref 36.0–46.0)
Hemoglobin: 15.5 g/dL — ABNORMAL HIGH (ref 12.0–15.0)
MCH: 31.3 pg (ref 26.0–34.0)
MCHC: 34 g/dL (ref 30.0–36.0)
MCV: 92.1 fL (ref 80.0–100.0)
Platelets: 279 10*3/uL (ref 150–400)
RBC: 4.95 MIL/uL (ref 3.87–5.11)
RDW: 15.4 % (ref 11.5–15.5)
WBC: 7.2 10*3/uL (ref 4.0–10.5)
nRBC: 0.4 % — ABNORMAL HIGH (ref 0.0–0.2)

## 2022-03-18 LAB — BASIC METABOLIC PANEL
Anion gap: 13 (ref 5–15)
BUN: 21 mg/dL — ABNORMAL HIGH (ref 6–20)
CO2: 22 mmol/L (ref 22–32)
Calcium: 8.9 mg/dL (ref 8.9–10.3)
Chloride: 106 mmol/L (ref 98–111)
Creatinine, Ser: 1.2 mg/dL — ABNORMAL HIGH (ref 0.44–1.00)
GFR, Estimated: 52 mL/min — ABNORMAL LOW (ref >60)
Glucose, Bld: 124 mg/dL — ABNORMAL HIGH (ref 70–99)
Potassium: 3.7 mmol/L (ref 3.5–5.1)
Sodium: 141 mmol/L (ref 135–145)

## 2022-03-18 LAB — MAGNESIUM: Magnesium: 1.6 mg/dL — ABNORMAL LOW (ref 1.7–2.4)

## 2022-03-18 MED ORDER — POTASSIUM CHLORIDE CRYS ER 20 MEQ PO TBCR
40.0000 meq | EXTENDED_RELEASE_TABLET | Freq: Once | ORAL | Status: AC
  Administered 2022-03-18: 40 meq via ORAL
  Filled 2022-03-18: qty 2

## 2022-03-18 MED ORDER — ACETAMINOPHEN 650 MG RE SUPP: 650.0000 mg | Freq: Four times a day (QID) | RECTAL | Status: DC | PRN

## 2022-03-18 MED ORDER — HYDRALAZINE HCL 25 MG PO TABS
25.0000 mg | ORAL_TABLET | Freq: Three times a day (TID) | ORAL | Status: DC
  Administered 2022-03-18 (×3): 25 mg via ORAL
  Filled 2022-03-18 (×3): qty 1

## 2022-03-18 MED ORDER — FUROSEMIDE 10 MG/ML IJ SOLN
40.0000 mg | Freq: Two times a day (BID) | INTRAMUSCULAR | Status: DC
  Administered 2022-03-18 – 2022-03-19 (×3): 40 mg via INTRAVENOUS
  Filled 2022-03-18 (×3): qty 4

## 2022-03-18 MED ORDER — IPRATROPIUM-ALBUTEROL 0.5-2.5 (3) MG/3ML IN SOLN
3.0000 mL | Freq: Four times a day (QID) | RESPIRATORY_TRACT | Status: DC
  Administered 2022-03-18 – 2022-03-19 (×6): 3 mL via RESPIRATORY_TRACT
  Filled 2022-03-18 (×7): qty 3

## 2022-03-18 MED ORDER — AMLODIPINE BESYLATE 10 MG PO TABS
10.0000 mg | ORAL_TABLET | Freq: Every day | ORAL | Status: DC
  Administered 2022-03-18 – 2022-03-23 (×6): 10 mg via ORAL
  Filled 2022-03-18 (×4): qty 1
  Filled 2022-03-18: qty 2
  Filled 2022-03-18: qty 1

## 2022-03-18 MED ORDER — SODIUM CHLORIDE 0.9% FLUSH
3.0000 mL | Freq: Two times a day (BID) | INTRAVENOUS | Status: DC
  Administered 2022-03-18 – 2022-03-25 (×16): 3 mL via INTRAVENOUS

## 2022-03-18 MED ORDER — ONDANSETRON HCL 4 MG/2ML IJ SOLN: 4.0000 mg | Freq: Four times a day (QID) | INTRAMUSCULAR | Status: DC | PRN

## 2022-03-18 MED ORDER — ONDANSETRON HCL 4 MG PO TABS: 4.0000 mg | ORAL_TABLET | Freq: Four times a day (QID) | ORAL | Status: DC | PRN

## 2022-03-18 MED ORDER — SENNOSIDES-DOCUSATE SODIUM 8.6-50 MG PO TABS: 1.0000 | ORAL_TABLET | Freq: Every evening | ORAL | Status: DC | PRN

## 2022-03-18 MED ORDER — ACETAMINOPHEN 325 MG PO TABS
650.0000 mg | ORAL_TABLET | Freq: Four times a day (QID) | ORAL | Status: DC | PRN
  Administered 2022-03-18 – 2022-03-25 (×3): 650 mg via ORAL
  Filled 2022-03-18 (×3): qty 2

## 2022-03-18 MED ORDER — ENOXAPARIN SODIUM 40 MG/0.4ML IJ SOSY
40.0000 mg | PREFILLED_SYRINGE | INTRAMUSCULAR | Status: DC
  Administered 2022-03-18 – 2022-03-26 (×9): 40 mg via SUBCUTANEOUS
  Filled 2022-03-18 (×9): qty 0.4

## 2022-03-18 MED ORDER — EMPAGLIFLOZIN 10 MG PO TABS
10.0000 mg | ORAL_TABLET | Freq: Every day | ORAL | Status: DC
  Administered 2022-03-18 – 2022-03-26 (×7): 10 mg via ORAL
  Filled 2022-03-18 (×8): qty 1

## 2022-03-18 NOTE — H&P (Signed)
History and Physical    Amanda [name] PTW:[id number] DOB: [birth year] DOA: 03/17/2022  PCP: Patient, No Pcp Per   Patient coming from: Home   Chief Complaint: Chest pain, SOB   HPI: Amanda [name] is a 61 y.o. female with medical history significant for cocaine and alcohol abuse, hypertension, chronic diastolic CHF, and CKD 3A who presents to the emergency department with 2 weeks of progressive shortness of breath and chest discomfort.  Patient reports approximately 2 weeks of chest discomfort and shortness of breath, initially mild and intermittent but becoming more severe and constant.  She has mild nonproductive cough associated with this but denied fever or chills.  She reports last cocaine use was a couple weeks ago.    Patient had a similar presentation in May 2023, was severely hypertensive, diuresed, and discharged on antihypertensive medications but has not been taking these.    ED Course: Upon arrival to the ED, patient is found to be afebrile and saturating 90s on room air with elevated respiratory rate and severely elevated blood pressure.  EKG demonstrates sinus rhythm with LVH and repolarization abnormality.  Chest x-ray notable for enlargement of the cardiac silhouette with vascular congestion and interstitial pulmonary edema.  Labs remarkable for glucose 211, potassium 3.4, creatinine 1.12, troponin 53 and then 54, and BNP 1526.  Patient was given 100 mg IV Lasix, DuoNeb, sublingual nitroglycerin, Ativan, and was then started on nitroglycerin infusion in the ED.  Review of Systems:  All other systems reviewed and apart from HPI, are negative.  Past Medical History:  Diagnosis Date   Hypertension     Past Surgical History:  Procedure Laterality Date   I & D EXTREMITY Right 01/21/2014   Procedure: IRRIGATION AND DEBRIDEMENT RIGHT INDEX FINGER;  Surgeon: Betha Loa, MD;  Location: [facility] OR;  Service: Orthopedics;  Laterality: Right;    Social History:   reports that she  has been smoking cigarettes. She has never used smokeless tobacco. She reports current alcohol use. She reports current drug use. Drug: "Crack" cocaine.  No Known Allergies  Family History  Problem Relation Age of Onset   Heart disease Mother        had a pacemaker     Prior to Admission medications   Medication Sig Start Date End Date Taking? Authorizing Provider  acetaminophen (TYLENOL) 500 MG tablet Take 1,000 mg by mouth every 6 (six) hours as needed for moderate pain or headache.    [provider]  amLODipine (NORVASC) 10 MG tablet Take 1 tablet (10 mg total) by mouth daily. 03/16/22 06/14/22  Gowens, Mariah L, PA-C  empagliflozin (JARDIANCE) 10 MG TABS tablet Take 1 tablet (10 mg total) by mouth daily. 09/11/21   Cipriano Bunker, MD  losartan (COZAAR) 50 MG tablet Take 1 tablet (50 mg total) by mouth daily. 09/10/21 01/08/22  Cipriano Bunker, MD  nitroGLYCERIN (NITROSTAT) 0.4 MG SL tablet Place 1 tablet (0.4 mg total) under the tongue every 5 (five) minutes as needed for chest pain (BLOOD PRESSURES GREATER THAN 180 systolic.). 09/10/21   Cipriano Bunker, MD    Physical Exam: Vitals:   03/18/22 0015 03/18/22 0030 03/18/22 0045 03/18/22 0115  BP: (!) 186/103 (!) 175/110 (!) 178/104 (!) 149/112  Pulse:      Resp: (!) 21 20 (!) 21 19  Temp:      TempSrc:      SpO2:    100%    Constitutional: NAD, no pallor or diaphoresis  Eyes: PERTLA, lids and  conjunctivae normal ENMT: Mucous membranes are moist. Posterior pharynx clear of any exudate or lesions.   Neck: supple, no masses  Respiratory: no wheezing. No accessory muscle use.  Cardiovascular: S1 & S2 heard, regular rate and rhythm. Pretibial pitting edema b/l.   Abdomen: No distension, no tenderness, soft. Bowel sounds active.  Musculoskeletal: no clubbing / cyanosis. No joint deformity upper and lower extremities.   Skin: no significant rashes, lesions, ulcers. Warm, dry, well-perfused. Neurologic: CN 2-12 grossly intact.  Moving all extremities. Alert and oriented.  Psychiatric: Calm. Cooperative.    Labs and Imaging on Admission: I have personally reviewed following labs and imaging studies  CBC: Recent Labs  Lab 03/16/22 1749 03/17/22 1646  WBC 7.0 7.0  HGB 14.9 14.8  HCT 44.5 44.2  MCV 91.6 90.8  PLT 267 282   Basic Metabolic Panel: Recent Labs  Lab 03/16/22 1749 03/17/22 1646  NA 137 136  K 3.2* 3.4*  CL 101 104  CO2 24 23  GLUCOSE 245* 211*  BUN 19 17  CREATININE 1.33* 1.12*  CALCIUM 8.8* 8.7*   GFR: Estimated Creatinine Clearance: 46.1 mL/min (A) (by C-G formula based on SCr of 1.12 mg/dL (H)). Liver Function Tests: Recent Labs  Lab 03/16/22 2220  AST 104*  ALT 124*  ALKPHOS 230*  BILITOT 0.6  PROT 7.1  ALBUMIN 3.1*   Recent Labs  Lab 03/16/22 2220  LIPASE 28   No results for input(s): "AMMONIA" in the last 168 hours. Coagulation Profile: No results for input(s): "INR", "PROTIME" in the last 168 hours. Cardiac Enzymes: No results for input(s): "CKTOTAL", "CKMB", "CKMBINDEX", "TROPONINI" in the last 168 hours. BNP (last 3 results) No results for input(s): "PROBNP" in the last 8760 hours. HbA1C: No results for input(s): "HGBA1C" in the last 72 hours. CBG: No results for input(s): "GLUCAP" in the last 168 hours. Lipid Profile: No results for input(s): "CHOL", "HDL", "LDLCALC", "TRIG", "CHOLHDL", "LDLDIRECT" in the last 72 hours. Thyroid Function Tests: No results for input(s): "TSH", "T4TOTAL", "FREET4", "T3FREE", "THYROIDAB" in the last 72 hours. Anemia Panel: No results for input(s): "VITAMINB12", "FOLATE", "FERRITIN", "TIBC", "IRON", "RETICCTPCT" in the last 72 hours. Urine analysis:    Component Value Date/Time   COLORURINE STRAW (A) 09/09/2021 0015   APPEARANCEUR CLEAR 09/09/2021 0015   LABSPEC 1.008 09/09/2021 0015   PHURINE 6.0 09/09/2021 0015   GLUCOSEU NEGATIVE 09/09/2021 0015   HGBUR SMALL (A) 09/09/2021 0015   BILIRUBINUR NEGATIVE 09/09/2021 0015    BILIRUBINUR Small 10/11/2013 1200   KETONESUR NEGATIVE 09/09/2021 0015   PROTEINUR NEGATIVE 09/09/2021 0015   UROBILINOGEN 0.2 10/11/2013 1200   UROBILINOGEN 0.2 02/28/2008 1612   NITRITE NEGATIVE 09/09/2021 0015   LEUKOCYTESUR NEGATIVE 09/09/2021 0015   Sepsis Labs: @LABRCNTIP (procalcitonin:4,lacticidven:4) )No results found for this or any previous visit (from the past 240 hour(s)).   Radiological Exams on Admission: DG Chest 1 View  Result Date: 03/17/2022 CLINICAL DATA:  Chest pain and shortness of breath. EXAM: CHEST  1 VIEW COMPARISON:  Chest x-ray from 2017 and chest CT 09/09/2021 FINDINGS: Prominent cardiopericardial silhouette, likely combination of cardiac enlargement and pericardial effusion as seen on prior chest CT. Central vascular congestion and interstitial pulmonary edema. No pleural effusions or focal infiltrates. The bony thorax is intact. IMPRESSION: 1. Cardiac enlargement. 2. CHF. Electronically Signed   By: 09/11/2021 M.D.   On: 03/17/2022 16:49   DG Chest 2 View  Result Date: 03/16/2022 CLINICAL DATA:  Chest pain EXAM: CHEST - 2 VIEW COMPARISON:  09/08/2021 FINDINGS: Unchanged cardiomegaly. Improved previously noted interstitial pulmonary edema. No new focal pulmonary opacity. No pleural effusion or pneumothorax. Previously noted trace right pleural effusion is no longer seen. No acute osseous abnormality. IMPRESSION: Unchanged cardiomegaly with improved previously noted interstitial pulmonary edema. Electronically Signed   By: Wiliam Ke M.D.   On: 03/16/2022 18:58    EKG: Independently reviewed. Sinus bradycardia, rate 59, LVH with repolarization abnormality.   Assessment/Plan   1. Acute on chronic HFpEF  - Presents with 2 wks of progressive chest discomfort and SOB and found to be severely hypertensive with pulmonary edema  - Likely related to uncontrolled HTN  - Treated in ED with 100 mg IV Lasix and IV nitroglycerin infusion  - Continue diuresis with  40 mg IV Lasix adjusted as needed, control BP as below, monitor weight and I/Os, monitor renal function and electrolytes    2. Hypertensive crisis   - BP as high as 213/139 in ED in setting of medication non-compliance and hypervolemia  - She was given IV Lasix and started on IV nitroglycerin infusion in ED  - Resume Norvasc with dose now, continue diuresis, continue nitroglycerin until SBP <165, and add additional antihypertensives if needed    3. CKD IIIa  - SCr is 1.12 on admission, appears close to baseline  - Renally-dose medications, monitor closely while diuresing    4. Elevated troponin  - Troponin 53 and then 54 in ED, not a pattern suggestive of ACS but likely d/t severe HTN and acute CHF  - Treat HTN and CHF as above    5. Cocaine abuse - Reports last use was ~2 weeks ago    - Counseled, cessation encouraged     DVT prophylaxis: Lovenox  Code Status: Full  Level of Care: Level of care: Progressive Family Communication: None present  Disposition Plan:  Patient is from: home  Anticipated d/c is to: Home  Anticipated d/c date is: 03/21/22  Patient currently: Pending improvement in BP and respiratory status  Consults called: none  Admission status: Inpatient     Briscoe Deutscher, MD [facility]  03/18/2022, 1:36 AM

## 2022-03-18 NOTE — Progress Notes (Addendum)
  Seen and examined, admitted earlier this morning by Dr. Antionette Char, please see his H&P for details, briefly Amanda [name] is a 61/F with history of chronic diastolic CHF, CKD 3a, hypertension, cocaine and alcohol abuse, noncompliance, presented to the ED with 2 weeks of chest discomfort, shortness of breath, cough and intermittent chest pain, last used cocaine about a week ago.  Has not been taking her medications consistently, does not have a PCP or cardiologist.  In the ED BP elevated, labs with BNP 1526, chest x-ray with pulmonary vascular congestion, mild interstitial edema, creatinine 1.12, troponin 53 and 54   Acute on chronic HFpEF  -Long history of noncompliance, recurrent admissions with hypertensive urgency, pulmonary edema and cocaine use -Last echo 5/23 with EF of 55-60%, mild LVH, grade 2 diastolic dysfunction -Continue IV Lasix today, monitor BP and kidney function closely  -Restart Jardiance   Hypertensive crisis   - BP as high as 213/139 in ED -Improving, continue Norvasc, add hydralazine, wean off nitro gtt. -Lasix as above   CKD IIIa  - SCr is 1.12 on admission, appears close to baseline  - Renally-dose medications, monitor closely while diuresing      Elevated troponin  - Troponin 53 and then 54 in ED, not a pattern suggestive of ACS but likely d/t severe HTN and acute CHF    Cocaine abuse - Reports last use was 1-2 weeks ago    - Counseled  SDOH -Poor health literacy, social support, polysubstance abuse, noncompliance will consult ToC     DVT prophylaxis: Lovenox  Code Status: Full  Level of Care: Level of care: Progressive Family Communication: None present  Amanda [name]

## 2022-03-18 NOTE — ED Notes (Signed)
RN titrating down nitro per MD direction in H&P and per direction of previous RN. Pt resting. Reporting mild headache at this time.

## 2022-03-18 NOTE — Progress Notes (Signed)
   Heart Failure Stewardship Pharmacist Progress Note   PCP: Patient, No Pcp Per PCP-Cardiologist: None    HPI:  61 yo AAF with PMH significant for HTN, polysubstance use (occasional cocaine, tobacco, marijuana, alcohol), CKD, and CHF.  Hospitalized in May with acute heart failure and hypertensive urgency. She reported medication non-adherence for years due to cost. UDS was positive for cocaine. Her Echo on 05/30 revealed LVEF 55-60% with mild LVH, G2DD, normal RV function, severe biatrial dilation, and mild MR. Diuresed, started on GDMT, and discharged. Did not show for HF TOC follow up.  She presented to the ED on 12/4 with hypertension. Discharged and advised to take her BP medications.  Back in the ED on 12/5 with worsening shortness of breath and hypertensive urgency, significant orthopnea. CXR showed central vascular congestion and interstitial pulmonary edema. Reported cocaine use about 1-2 weeks ago. No UDS collected on admission.  Current HF Medications: Diuretic: furosemide 40 mg IV BID SGLT2i: Jardiance 10 mg daily Other: hydralazine 50 mg TID  Prior to admission HF Medications: None - not taking any meds (Jardiance, losartan, amlodipine)  Pertinent Lab Values: Serum creatinine 1.47, BUN 23, Potassium 3.3, Sodium 136, BNP 1525.6, Magnesium 1.6 (12/6), A1c 6.8   Vital Signs: Weight: 135 lbs (admission weight: 135 lbs) Blood pressure: 180/100s  Heart rate: 90-100s  I/O: -2.4L yesterday; net -1.6L  Medication Assistance / Insurance Benefits Check: Does the patient have prescription insurance?  Yes Type of insurance plan: [place] Medicaid  Outpatient Pharmacy:  Prior to admission outpatient pharmacy: [facility] Is the patient willing to use [facility] TOC pharmacy at discharge? Yes Is the patient willing to transition their outpatient pharmacy to utilize a [facility] outpatient pharmacy?   Pending     Assessment: 1. Acute on chronic diastolic CHF (LVEF 55-60%), due to presumed  NICM. No ischemic evaluation done. NYHA class III symptoms. - On furosemide 40 mg IV BID. Symptoms have improved and no LE edema on exam. Consider transitioning to oral diuretic. Strict I/Os and daily weights. Keep K>4 and Mag>2. Magnesium not replaced yesterday, recommend 2g IV x 1 and recheck magnesium with AM labs tomorrow. KCl 40 mEq x 2 ordered for replacement. - Creatinine bumped today, baseline ~1.2. Consider starting MRA and ARB/Entresto once renal function stabilizes. - Continue Jardiance 10 mg daily   Plan: 1) Medication changes recommended at this time: - Transition to PO lasix  2) Patient assistance: - Prior authorization required for Jardiance, will complete today - Entresto also requires prior authorization, will complete if added  3)  Education  - Patient has been educated on current HF medications and potential additions to HF medication regimen - Patient verbalizes understanding that over the next few months, these medication doses may change and more medications may be added to optimize HF regimen - Patient has been educated on basic disease state pathophysiology and goals of therapy   Sharen Hones, PharmD, BCPS Heart Failure Stewardship Pharmacist Phone [phone]

## 2022-03-18 NOTE — ED Notes (Signed)
Called to activate the purple man 

## 2022-03-19 ENCOUNTER — Other Ambulatory Visit: Payer: Self-pay

## 2022-03-19 ENCOUNTER — Other Ambulatory Visit (HOSPITAL_COMMUNITY): Payer: Self-pay

## 2022-03-19 ENCOUNTER — Encounter (HOSPITAL_COMMUNITY): Payer: Self-pay | Admitting: Family Medicine

## 2022-03-19 DIAGNOSIS — I5033 Acute on chronic diastolic (congestive) heart failure: Secondary | ICD-10-CM | POA: Diagnosis not present

## 2022-03-19 LAB — BASIC METABOLIC PANEL
Anion gap: 12 (ref 5–15)
BUN: 23 mg/dL — ABNORMAL HIGH (ref 6–20)
CO2: 27 mmol/L (ref 22–32)
Calcium: 8.6 mg/dL — ABNORMAL LOW (ref 8.9–10.3)
Chloride: 97 mmol/L — ABNORMAL LOW (ref 98–111)
Creatinine, Ser: 1.47 mg/dL — ABNORMAL HIGH (ref 0.44–1.00)
GFR, Estimated: 41 mL/min — ABNORMAL LOW (ref >60)
Glucose, Bld: 119 mg/dL — ABNORMAL HIGH (ref 70–99)
Potassium: 3.3 mmol/L — ABNORMAL LOW (ref 3.5–5.1)
Sodium: 136 mmol/L (ref 135–145)

## 2022-03-19 LAB — CBC
HCT: 42.3 % (ref 36.0–46.0)
Hemoglobin: 14.1 g/dL (ref 12.0–15.0)
MCH: 30.3 pg (ref 26.0–34.0)
MCHC: 33.3 g/dL (ref 30.0–36.0)
MCV: 91 fL (ref 80.0–100.0)
Platelets: 258 10*3/uL (ref 150–400)
RBC: 4.65 MIL/uL (ref 3.87–5.11)
RDW: 14.8 % (ref 11.5–15.5)
WBC: 8.9 10*3/uL (ref 4.0–10.5)
nRBC: 0 % (ref 0.0–0.2)

## 2022-03-19 MED ORDER — LEVALBUTEROL HCL 0.63 MG/3ML IN NEBU
0.6300 mg | INHALATION_SOLUTION | Freq: Four times a day (QID) | RESPIRATORY_TRACT | Status: DC
  Administered 2022-03-20 (×2): 0.63 mg via RESPIRATORY_TRACT
  Filled 2022-03-19 (×3): qty 3

## 2022-03-19 MED ORDER — ISOSORBIDE MONONITRATE ER 30 MG PO TB24
30.0000 mg | ORAL_TABLET | Freq: Every day | ORAL | Status: DC
  Administered 2022-03-19 – 2022-03-21 (×3): 30 mg via ORAL
  Filled 2022-03-19 (×4): qty 1

## 2022-03-19 MED ORDER — ALPRAZOLAM 0.5 MG PO TABS: 0.5000 mg | ORAL_TABLET | Freq: Two times a day (BID) | ORAL | Status: DC | PRN

## 2022-03-19 MED ORDER — HYDRALAZINE HCL 50 MG PO TABS
50.0000 mg | ORAL_TABLET | Freq: Three times a day (TID) | ORAL | Status: DC
  Administered 2022-03-19 – 2022-03-24 (×12): 50 mg via ORAL
  Filled 2022-03-19 (×13): qty 1

## 2022-03-19 MED ORDER — THIAMINE MONONITRATE 100 MG PO TABS
100.0000 mg | ORAL_TABLET | Freq: Every day | ORAL | Status: DC
  Administered 2022-03-19 – 2022-03-26 (×6): 100 mg via ORAL
  Filled 2022-03-19 (×7): qty 1

## 2022-03-19 MED ORDER — ADULT MULTIVITAMIN W/MINERALS CH
1.0000 | ORAL_TABLET | Freq: Every day | ORAL | Status: DC
  Administered 2022-03-19 – 2022-03-26 (×5): 1 via ORAL
  Filled 2022-03-19 (×6): qty 1

## 2022-03-19 MED ORDER — FOLIC ACID 1 MG PO TABS
1.0000 mg | ORAL_TABLET | Freq: Every day | ORAL | Status: DC
  Administered 2022-03-19 – 2022-03-26 (×5): 1 mg via ORAL
  Filled 2022-03-19 (×6): qty 1

## 2022-03-19 MED ORDER — THIAMINE HCL 100 MG/ML IJ SOLN
100.0000 mg | Freq: Every day | INTRAMUSCULAR | Status: DC
  Administered 2022-03-20 – 2022-03-24 (×2): 100 mg via INTRAVENOUS
  Filled 2022-03-19 (×2): qty 2

## 2022-03-19 MED ORDER — HYDRALAZINE HCL 20 MG/ML IJ SOLN
10.0000 mg | Freq: Four times a day (QID) | INTRAMUSCULAR | Status: DC | PRN
  Administered 2022-03-19 – 2022-03-21 (×3): 10 mg via INTRAVENOUS
  Filled 2022-03-19 (×3): qty 1

## 2022-03-19 MED ORDER — MAGNESIUM SULFATE 2 GM/50ML IV SOLN
2.0000 g | Freq: Once | INTRAVENOUS | Status: AC
  Administered 2022-03-19: 2 g via INTRAVENOUS
  Filled 2022-03-19: qty 50

## 2022-03-19 MED ORDER — LORAZEPAM 1 MG PO TABS
1.0000 mg | ORAL_TABLET | ORAL | Status: AC | PRN
  Administered 2022-03-22: 1 mg via ORAL
  Filled 2022-03-19: qty 1

## 2022-03-19 MED ORDER — LORAZEPAM 2 MG/ML IJ SOLN
1.0000 mg | INTRAMUSCULAR | Status: AC | PRN
  Administered 2022-03-19 (×3): 2 mg via INTRAVENOUS
  Administered 2022-03-20 (×2): 1 mg via INTRAVENOUS
  Administered 2022-03-20: 3 mg via INTRAVENOUS
  Administered 2022-03-21 – 2022-03-22 (×3): 1 mg via INTRAVENOUS
  Filled 2022-03-19: qty 1
  Filled 2022-03-19: qty 2
  Filled 2022-03-19 (×7): qty 1

## 2022-03-19 MED ORDER — POTASSIUM CHLORIDE CRYS ER 20 MEQ PO TBCR
40.0000 meq | EXTENDED_RELEASE_TABLET | Freq: Two times a day (BID) | ORAL | Status: AC
  Administered 2022-03-19 (×2): 40 meq via ORAL
  Filled 2022-03-19 (×2): qty 2

## 2022-03-19 MED ORDER — IPRATROPIUM-ALBUTEROL 0.5-2.5 (3) MG/3ML IN SOLN: 3.0000 mL | RESPIRATORY_TRACT | Status: DC | PRN

## 2022-03-19 NOTE — Progress Notes (Signed)
1720 notified Dr. Jomarie Longs that patient shaky and noting s/s possible withdrawal from alcohol. Patient states she drinks daily 2-3 bottles of wine or 2-3 6 packs of beer. Orders received for CIWA and medications. 1730 Implemented safety changes, in bed with bed alarm on. Floor mat placed. Patient instructed to call for help if needing to get out of bed because she was medicated with medicine that would make her drowsy and sleepy.

## 2022-03-19 NOTE — Care Management (Signed)
  Transition of Care [facility]) Screening Note   Patient Details  Name: Amanda [name] Date of Birth: 01/12/1961   Transition of Care [facility]) CM/SW Contact:    Gala Lewandowsky, RN Phone Number: 03/19/2022, 9:37 AM    Transition of Care Department [facility]) has reviewed the patient and no TOC needs have been identified at this time. We will continue to monitor patient advancement through interdisciplinary progression rounds. If new patient transition needs arise, please place a TOC consult.

## 2022-03-19 NOTE — Progress Notes (Addendum)
    Heart Failure Stewardship Pharmacist Progress Note   Received notification from [facility] that prior authorization for [place] is required.   PA submitted on CoverMyMeds Key [id number] Status is approved through 03/19/23.   Sharen Hones, PharmD, BCPS Heart Failure Stewardship Pharmacist Phone [phone]

## 2022-03-19 NOTE — Progress Notes (Signed)
Called Dr. Jomarie Longs to report patients increased anxiety and blood pressure increasing. Orders for antianxiety given

## 2022-03-19 NOTE — Progress Notes (Signed)
   03/19/22 1947  Assess: MEWS Score  Temp 97.8 F (36.6 C)  BP (!) 189/102  MAP (mmHg) 129  Pulse Rate (!) 105  ECG Heart Rate (!) 116  Resp (!) 25  SpO2 98 %  O2 Device Room Air  Assess: MEWS Score  MEWS Temp 0  MEWS Systolic 0  MEWS Pulse 2  MEWS RR 1  MEWS LOC 0  MEWS Score 3  MEWS Score Color Yellow  Assess: if the MEWS score is Yellow or Red  Were vital signs taken at a resting state? Yes  Focused Assessment No change from prior assessment  Does the patient meet 2 or more of the SIRS criteria? Yes  MEWS guidelines implemented *See Row Information* Yes  Treat  MEWS Interventions Administered scheduled meds/treatments  Pain Scale 0-10  Pain Score 0  Take Vital Signs  Increase Vital Sign Frequency  Yellow: Q 2hr X 2 then Q 4hr X 2, if remains yellow, continue Q 4hrs  Escalate  MEWS: Escalate Yellow: discuss with charge nurse/RN and consider discussing with provider and RRT  Notify: Charge Nurse/RN  Name of Charge Nurse/RN Notified Deborah, RN  Date Charge Nurse/RN Notified 03/19/22  Time Charge Nurse/RN Notified 2003  Document  Patient Outcome Other (Comment) (Not in distress. Plan of care ongoing.)  Progress note created (see row info) Yes  Assess: SIRS CRITERIA  SIRS Temperature  0  SIRS Pulse 1  SIRS Respirations  1  SIRS WBC 1  SIRS Score Sum  3

## 2022-03-19 NOTE — Plan of Care (Signed)
  Problem: Clinical Measurements: Goal: Respiratory complications will improve Outcome: Progressing   Problem: Clinical Measurements: Goal: Cardiovascular complication will be avoided Outcome: Progressing   Problem: Clinical Measurements: Goal: Ability to maintain clinical measurements within normal limits will improve Outcome: Progressing   Problem: Coping: Goal: Level of anxiety will decrease Outcome: Progressing   Problem: Safety: Goal: Ability to remain free from injury will improve Outcome: Progressing   Problem: Skin Integrity: Goal: Risk for impaired skin integrity will decrease Outcome: Progressing

## 2022-03-19 NOTE — Progress Notes (Signed)
Heart Failure Nurse Navigator Progress Note  PCP: Patient, No Pcp Per PCP-Cardiologist: None Admission Diagnosis: Chest wall pain, Uncontrolled hypertension, Cocaine abuse Admitted from: Home  Presentation:   Amanda [name] presented with chest pain centralized x 1 month, patient reported she hasn't taking her HTN medication for years, patient still smokes cigarettes and occasional cocaine. BP 152/139, HR 54, +1 bilateral edema to lower extremities. BNP 1,525.   Patient was educated on the sign and symptoms of heart failure, daily weights, diet/ fluid restrictions, taking all her prescribed medications, attending all medical appointments, substance cessation. Patient verbalized her understanding, she wanted to come to HF TOC again. And was scheduled for 03/30/22 @ 10 am.   ECHO/ LVEF: 55-60%  Clinical Course:  Past Medical History:  Diagnosis Date   Hypertension      Social History   Socioeconomic History   Marital status: Single    Spouse name: Not on file   Number of children: 4   Years of education: Not on file   Highest education level: High school graduate  Occupational History    Comment: Not working right now  Tobacco Use   Smoking status: Some Days    Types: Cigarettes   Smokeless tobacco: Never  Vaping Use   Vaping Use: Never used  Substance and Sexual Activity   Alcohol use: Yes    Comment: 3 times a week   Drug use: Not Currently    Types: "Crack" cocaine   Sexual activity: Yes  Other Topics Concern   Not on file  Social History Narrative   Not on file   Social Determinants of Health   Financial Resource Strain: High Risk (03/19/2022)   Overall Financial Resource Strain (CARDIA)    Difficulty of Paying Living Expenses: Hard  Food Insecurity: No Food Insecurity (03/19/2022)   Hunger Vital Sign    Worried About Running Out of Food in the Last Year: Never true    Ran Out of Food in the Last Year: Never true  Transportation Needs: No Transportation Needs  (03/19/2022)   PRAPARE - Administrator, Civil Service (Medical): No    Lack of Transportation (Non-Medical): No  Physical Activity: Not on file  Stress: Not on file  Social Connections: Not on file   Education Assessment and Provision:  Detailed education and instructions provided on heart failure disease management including the following:  Signs and symptoms of Heart Failure When to call the physician Importance of daily weights Low sodium diet Fluid restriction Medication management Anticipated future follow-up appointments  Patient education given on each of the above topics.  Patient acknowledges understanding via teach back method and acceptance of all instructions.  Education Materials:  "Living Better With Heart Failure" Booklet, HF zone tool, & Daily Weight Tracker Tool.  Patient has scale at home: yes Patient has pill box at home: NA    High Risk Criteria for Readmission and/or Poor Patient Outcomes: Heart failure hospital admissions (last 6 months): 1  No Show rate: 23 %  Difficult social situation: No Demonstrates medication adherence: No Primary Language: English Literacy level: Reading, writing, and comprehension  Barriers of Care:   Diet/ fluid restrictions Substance cessation Medication compliance  Considerations/Referrals:   Referral made to Heart Failure Pharmacist Amanda [name]: yes Referral made to Heart Failure CSW/NCM TOC: Yes, substance resources Referral made to Heart & Vascular TOC clinic: yes, 03/30/22  Items for Follow-up on DC/TOC: Diet/ fluid restrictions Substance cessation Medication compliance   Rhae Hammock, BSN,  RN Heart Failure Nurse Navigator Secure Chat Only

## 2022-03-19 NOTE — Progress Notes (Addendum)
PROGRESS NOTE    Amanda [name]  QMV:[id number] DOB: 08/20/1960 DOA: 03/17/2022 PCP: Patient, No Pcp Per  Amanda [name] is a 61/F with history of chronic diastolic CHF, CKD 3a, hypertension, cocaine and alcohol abuse, noncompliance, presented to the ED with 2 weeks of chest discomfort, shortness of breath, cough and intermittent chest pain, last used cocaine about a week ago.  Has not been taking her medications consistently, does not have a PCP or cardiologist.  In the ED BP elevated, labs with BNP 1526, chest x-ray with pulmonary vascular congestion, mild interstitial edema, creatinine 1.12, troponin 53 and 54   Subjective: Feels better overall, breathing is improving, still on nitro gtt.  Assessment and Plan:  Acute on chronic HFpEF  -Long history of noncompliance, recurrent admissions with hypertensive urgency, pulmonary edema and cocaine use -Last echo 5/23 with EF of 55-60%, mild LVH, grade 2 diastolic dysfunction -Hold further IV Lasix today, she is 1.6 L negative, resume oral diuretics tomorrow, continue Jardiance -BMP in a.m., will consider low-dose Aldactone -Agreeable to TOC follow-up   Hypertensive crisis   - BP as high as 213/139 in ED, worsened in the setting of cocaine -Still elevated, continue Norvasc, increase hydralazine, add Imdur, wean off nitro gtt. today -Lasix as above   CKD IIIa  - SCr is 1.12 on admission, appears close to baseline  - Renally-dose medications, monitor closely while diuresing      Elevated troponin  - Troponin 53 and then 54 in ED, not a pattern suggestive of ACS but likely d/t severe HTN and acute CHF    Cocaine abuse - Reports last use was 1-2 weeks ago    - Counseled   SDOH -Poor health literacy, social support, polysubstance abuse, noncompliance will consult ToC     DVT prophylaxis: Lovenox  Code Status: Full  Family Communication: None present Disposition Plan: Home in 1 to 2 days  Consultants:    Procedures:    Antimicrobials:    Objective: Vitals:   03/18/22 2347 03/19/22 0358 03/19/22 0745 03/19/22 0801  BP: (!) 155/102 (!) 175/108  (!) 181/124  Pulse: 99 (!) 102  98  Resp: 16 19  20   Temp: 98.8 F (37.1 C) 98.8 F (37.1 C)  97.7 F (36.5 C)  TempSrc: Oral Oral  Oral  SpO2:   99%   Weight:  61.6 kg      Intake/Output Summary (Last 24 hours) at 03/19/2022 1232 Last data filed at 03/19/2022 0400 Gross per 24 hour  Intake 762.07 ml  Output 1200 ml  Net -437.93 ml   Filed Weights   03/19/22 0358  Weight: 61.6 kg    Examination:  General exam: Pleasant female sitting up in bed, AAOx3, no distress HEENT: No JVD CVS: S1-S2, regular rhythm Lungs: Poor air movement bilaterally otherwise clear Abdomen: Soft, nontender, bowel sounds present Extremities: No edema Skin: No rashes Psychiatry:  Mood & affect appropriate.     Data Reviewed:   CBC: Recent Labs  Lab 03/16/22 1749 03/17/22 1646 03/18/22 0318 03/19/22 0301  WBC 7.0 7.0 7.2 8.9  HGB 14.9 14.8 15.5* 14.1  HCT 44.5 44.2 45.6 42.3  MCV 91.6 90.8 92.1 91.0  PLT 267 282 279 258   Basic Metabolic Panel: Recent Labs  Lab 03/16/22 1749 03/17/22 1646 03/18/22 0318 03/19/22 0301  NA 137 136 141 136  K 3.2* 3.4* 3.7 3.3*  CL 101 104 106 97*  CO2 24 23 22 27   GLUCOSE 245* 211* 124* 119*  BUN  19 17 21* 23*  CREATININE 1.33* 1.12* 1.20* 1.47*  CALCIUM 8.8* 8.7* 8.9 8.6*  MG  --   --  1.6*  --    GFR: Estimated Creatinine Clearance: 35.1 mL/min (A) (by C-G formula based on SCr of 1.47 mg/dL (H)). Liver Function Tests: Recent Labs  Lab 03/16/22 2220 03/18/22 2210  AST 104* 73*  ALT 124* 103*  ALKPHOS 230* 183*  BILITOT 0.6 0.7  PROT 7.1 6.3*  ALBUMIN 3.1* 2.8*   Recent Labs  Lab 03/16/22 2220  LIPASE 28   No results for input(s): "AMMONIA" in the last 168 hours. Coagulation Profile: No results for input(s): "INR", "PROTIME" in the last 168 hours. Cardiac Enzymes: No results for input(s):  "CKTOTAL", "CKMB", "CKMBINDEX", "TROPONINI" in the last 168 hours. BNP (last 3 results) No results for input(s): "PROBNP" in the last 8760 hours. HbA1C: No results for input(s): "HGBA1C" in the last 72 hours. CBG: No results for input(s): "GLUCAP" in the last 168 hours. Lipid Profile: No results for input(s): "CHOL", "HDL", "LDLCALC", "TRIG", "CHOLHDL", "LDLDIRECT" in the last 72 hours. Thyroid Function Tests: No results for input(s): "TSH", "T4TOTAL", "FREET4", "T3FREE", "THYROIDAB" in the last 72 hours. Anemia Panel: No results for input(s): "VITAMINB12", "FOLATE", "FERRITIN", "TIBC", "IRON", "RETICCTPCT" in the last 72 hours. Urine analysis:    Component Value Date/Time   COLORURINE STRAW (A) 09/09/2021 0015   APPEARANCEUR CLEAR 09/09/2021 0015   LABSPEC 1.008 09/09/2021 0015   PHURINE 6.0 09/09/2021 0015   GLUCOSEU NEGATIVE 09/09/2021 0015   HGBUR SMALL (A) 09/09/2021 0015   BILIRUBINUR NEGATIVE 09/09/2021 0015   BILIRUBINUR Small 10/11/2013 1200   KETONESUR NEGATIVE 09/09/2021 0015   PROTEINUR NEGATIVE 09/09/2021 0015   UROBILINOGEN 0.2 10/11/2013 1200   UROBILINOGEN 0.2 02/28/2008 1612   NITRITE NEGATIVE 09/09/2021 0015   LEUKOCYTESUR NEGATIVE 09/09/2021 0015   Sepsis Labs: @LABRCNTIP (procalcitonin:4,lacticidven:4)  )No results found for this or any previous visit (from the past 240 hour(s)).   Radiology Studies: DG Chest 1 View  Result Date: 03/17/2022 CLINICAL DATA:  Chest pain and shortness of breath. EXAM: CHEST  1 VIEW COMPARISON:  Chest x-ray from 2017 and chest CT 09/09/2021 FINDINGS: Prominent cardiopericardial silhouette, likely combination of cardiac enlargement and pericardial effusion as seen on prior chest CT. Central vascular congestion and interstitial pulmonary edema. No pleural effusions or focal infiltrates. The bony thorax is intact. IMPRESSION: 1. Cardiac enlargement. 2. CHF. Electronically Signed   By: 09/11/2021 M.D.   On: 03/17/2022 16:49      Scheduled Meds:  amLODipine  10 mg Oral Daily   empagliflozin  10 mg Oral Daily   enoxaparin (LOVENOX) injection  40 mg Subcutaneous Q24H   furosemide  40 mg Intravenous Q12H   hydrALAZINE  50 mg Oral TID   ipratropium-albuterol  3 mL Nebulization QID   potassium chloride  40 mEq Oral BID   sodium chloride flush  3 mL Intravenous Q12H   Continuous Infusions:  nitroGLYCERIN 45 mcg/min (03/19/22 0400)     LOS: 1 day    Time spent: 14/07/23    , MD [facility]   03/19/2022, 12:32 PM

## 2022-03-20 DIAGNOSIS — I5033 Acute on chronic diastolic (congestive) heart failure: Secondary | ICD-10-CM | POA: Diagnosis not present

## 2022-03-20 LAB — BASIC METABOLIC PANEL
Anion gap: 13 (ref 5–15)
BUN: 16 mg/dL (ref 6–20)
CO2: 26 mmol/L (ref 22–32)
Calcium: 9.2 mg/dL (ref 8.9–10.3)
Chloride: 100 mmol/L (ref 98–111)
Creatinine, Ser: 1.01 mg/dL — ABNORMAL HIGH (ref 0.44–1.00)
GFR, Estimated: >60 mL/min (ref >60)
Glucose, Bld: 109 mg/dL — ABNORMAL HIGH (ref 70–99)
Potassium: 3.9 mmol/L (ref 3.5–5.1)
Sodium: 139 mmol/L (ref 135–145)

## 2022-03-20 LAB — GLUCOSE, CAPILLARY: Glucose-Capillary: 156 mg/dL — ABNORMAL HIGH (ref 70–99)

## 2022-03-20 LAB — MAGNESIUM: Magnesium: 2.1 mg/dL (ref 1.7–2.4)

## 2022-03-20 MED ORDER — DIAZEPAM 5 MG PO TABS
5.0000 mg | ORAL_TABLET | Freq: Two times a day (BID) | ORAL | Status: DC
  Administered 2022-03-20: 5 mg via ORAL
  Filled 2022-03-20: qty 1

## 2022-03-20 MED ORDER — LEVALBUTEROL HCL 0.63 MG/3ML IN NEBU
0.6300 mg | INHALATION_SOLUTION | Freq: Four times a day (QID) | RESPIRATORY_TRACT | Status: DC | PRN
  Administered 2022-03-22: 0.63 mg via RESPIRATORY_TRACT
  Filled 2022-03-20 (×2): qty 3

## 2022-03-20 MED ORDER — LABETALOL HCL 5 MG/ML IV SOLN
10.0000 mg | INTRAVENOUS | Status: DC | PRN
  Administered 2022-03-20 – 2022-03-23 (×6): 10 mg via INTRAVENOUS
  Filled 2022-03-20 (×5): qty 4

## 2022-03-20 NOTE — Progress Notes (Addendum)
   Heart Failure Stewardship Pharmacist Progress Note   PCP: Patient, No Pcp Per PCP-Cardiologist: None    HPI:  61 yo AAF with PMH significant for HTN, polysubstance use (occasional cocaine, tobacco, marijuana, alcohol), CKD, and CHF.  Hospitalized in May with acute heart failure and hypertensive urgency. She reported medication non-adherence for years due to cost. UDS was positive for cocaine. Her Echo on 05/30 revealed LVEF 55-60% with mild LVH, G2DD, normal RV function, severe biatrial dilation, and mild MR. Diuresed, started on GDMT, and discharged. Did not show for HF TOC follow up.  She presented to the ED on 12/4 with hypertension. Discharged and advised to take her BP medications.  Back in the ED on 12/5 with worsening shortness of breath and hypertensive urgency, significant orthopnea. CXR showed central vascular congestion and interstitial pulmonary edema. Reported cocaine use about 1-2 weeks ago. No UDS collected on admission.  Current HF Medications: SGLT2i: Jardiance 10 mg daily Other: hydralazine 50 mg TID + Imdur 30 mg daily  Prior to admission HF Medications: None - not taking any meds (Jardiance, losartan, amlodipine)  Pertinent Lab Values: Serum creatinine 1.01, BUN 16, Potassium 3.9, Sodium 139, BNP 1525.6, Magnesium 2.1, A1c 6.8   Vital Signs: Weight: 136 lbs (admission weight: 135 lbs) Blood pressure: 150/90s  Heart rate: 120s  I/O: -0.2L yesterday; net -2.7L  Medication Assistance / Insurance Benefits Check: Does the patient have prescription insurance?  Yes Type of insurance plan: [place] Medicaid  Outpatient Pharmacy:  [facility] to [facility] Is the patient willing to use [facility] TOC pharmacy at discharge? Yes Is the patient willing to transition their outpatient pharmacy to utilize a [facility] outpatient pharmacy?   Pending     Assessment: 1. Acute on chronic diastolic CHF (LVEF 55-60%), due to presumed NICM. No ischemic  evaluation done. NYHA class III symptoms. - Off IV lasix. Consider transitioning to oral diuretic. Strict I/Os and daily weights. Keep K>4 and Mag>2.  - Creatinine improved today, baseline ~1.2. Consider starting MRA and ARB/Entresto once renal function stabilizes. - Continue Jardiance 10 mg daily   Plan: 1) Medication changes recommended at this time: - Start spironolactone 25 mg daily  2) Patient assistance: - Prior authorization required for Jardiance, approved - Entresto also requires prior authorization, will complete if added  3)  Education  - Patient has been educated on current HF medications and potential additions to HF medication regimen - Patient verbalizes understanding that over the next few months, these medication doses may change and more medications may be added to optimize HF regimen - Patient has been educated on basic disease state pathophysiology and goals of therapy   Sharen Hones, PharmD, BCPS Heart Failure Engineer, building services Phone [phone]

## 2022-03-20 NOTE — Progress Notes (Signed)
Refused blood works. Plan of care ongoing.  

## 2022-03-20 NOTE — Progress Notes (Addendum)
PROGRESS NOTE    Amanda [name]  GQQ:[id number] DOB: 1960/04/25 DOA: 03/17/2022 PCP: Patient, No Pcp Per  Amanda [name] is a 61/F with history of chronic diastolic CHF, CKD 3a, hypertension, cocaine and alcohol abuse, noncompliance, presented to the ED with 2 weeks of chest discomfort, shortness of breath, cough and intermittent chest pain, last used cocaine about a week ago.  Has not been taking her medications consistently, does not have a PCP or cardiologist.  In the ED BP elevated, labs with BNP 1526, chest x-ray with pulmonary vascular congestion, mild interstitial edema, creatinine 1.12, troponin 53 and 54  -12/7 evening onwards, significant withdrawal symptoms, started on CIWA  Subjective: -Drowsy this morning, got 8 to 9 mg of Ativan last night  Assessment and Plan:  Acute on chronic HFpEF  -Long history of noncompliance, recurrent admissions with hypertensive urgency, pulmonary edema and cocaine use -Last echo 5/23 with EF of 55-60%, mild LVH, grade 2 diastolic dysfunction -Holding further IV Lasix, clinically appears euvolemic, continue Jardiance, she is drowsy, hold Lasix today -Add Aldactone tomorrow,  -Agreeable to TOC follow-up  Alcohol withdrawal -Significant withdrawal overnight, given large doses of Ativan per CIWA protocol, add Valium twice daily -Continue thiamine   Hypertensive crisis   - BP as high as 213/139 in ED, worsened in the setting of cocaine -Still elevated, continue Norvasc, hydralazine, Imdur, weaned off nitro gtt. -Lasix as above   CKD IIIa  - SCr is 1.12 on admission, appears close to baseline  - Renally-dose medications, monitor closely while diuresing      Elevated troponin  - Troponin 53 and then 54 in ED, not a pattern suggestive of ACS but likely d/t severe HTN and acute CHF    Cocaine abuse - Reports last use was 1-2 weeks ago    - Counseled   SDOH -Poor health literacy, social support, polysubstance abuse, noncompliance, TOC follow-up  scheduled     DVT prophylaxis: Lovenox  Code Status: Full  Family Communication: None present Disposition Plan: Home when improved  Consultants:    Procedures:   Antimicrobials:    Objective: Vitals:   03/20/22 0530 03/20/22 0759 03/20/22 0826 03/20/22 1120  BP:   (!) 158/97 (!) 165/108  Pulse: 61  (!) 58 60  Resp:    (!) 25  Temp:    97.6 F (36.4 C)  TempSrc:    Oral  SpO2:  98% 97% 94%  Weight:      Height:        Intake/Output Summary (Last 24 hours) at 03/20/2022 1247 Last data filed at 03/20/2022 0245 Gross per 24 hour  Intake 550.95 ml  Output 1650 ml  Net -1099.05 ml   Filed Weights   03/19/22 0358 03/20/22 0500  Weight: 61.6 kg 62.1 kg    Examination:  General exam: Somnolent, awakens to painful stimuli only, dozes off to sleep CVS: S1-S2, regular rhythm Lungs: Poor air movement bilaterally otherwise clear Abdomen: Soft, nontender, bowel sounds present Extremities: No edema Skin: No rashes Psychiatry: Unable to assess    Data Reviewed:   CBC: Recent Labs  Lab 03/16/22 1749 03/17/22 1646 03/18/22 0318 03/19/22 0301  WBC 7.0 7.0 7.2 8.9  HGB 14.9 14.8 15.5* 14.1  HCT 44.5 44.2 45.6 42.3  MCV 91.6 90.8 92.1 91.0  PLT 267 282 279 258   Basic Metabolic Panel: Recent Labs  Lab 03/16/22 1749 03/17/22 1646 03/18/22 0318 03/19/22 0301 03/20/22 0636  NA 137 136 141 136 139  K 3.2* 3.4*  3.7 3.3* 3.9  CL 101 104 106 97* 100  CO2 24 23 22 27 26   GLUCOSE 245* 211* 124* 119* 109*  BUN 19 17 21* 23* 16  CREATININE 1.33* 1.12* 1.20* 1.47* 1.01*  CALCIUM 8.8* 8.7* 8.9 8.6* 9.2  MG  --   --  1.6*  --  2.1   GFR: Estimated Creatinine Clearance: 51.1 mL/min (A) (by C-G formula based on SCr of 1.01 mg/dL (H)). Liver Function Tests: Recent Labs  Lab 03/16/22 2220 03/18/22 2210  AST 104* 73*  ALT 124* 103*  ALKPHOS 230* 183*  BILITOT 0.6 0.7  PROT 7.1 6.3*  ALBUMIN 3.1* 2.8*   Recent Labs  Lab 03/16/22 2220  LIPASE 28   No  results for input(s): "AMMONIA" in the last 168 hours. Coagulation Profile: No results for input(s): "INR", "PROTIME" in the last 168 hours. Cardiac Enzymes: No results for input(s): "CKTOTAL", "CKMB", "CKMBINDEX", "TROPONINI" in the last 168 hours. BNP (last 3 results) No results for input(s): "PROBNP" in the last 8760 hours. HbA1C: No results for input(s): "HGBA1C" in the last 72 hours. CBG: No results for input(s): "GLUCAP" in the last 168 hours. Lipid Profile: No results for input(s): "CHOL", "HDL", "LDLCALC", "TRIG", "CHOLHDL", "LDLDIRECT" in the last 72 hours. Thyroid Function Tests: No results for input(s): "TSH", "T4TOTAL", "FREET4", "T3FREE", "THYROIDAB" in the last 72 hours. Anemia Panel: No results for input(s): "VITAMINB12", "FOLATE", "FERRITIN", "TIBC", "IRON", "RETICCTPCT" in the last 72 hours. Urine analysis:    Component Value Date/Time   COLORURINE STRAW (A) 09/09/2021 0015   APPEARANCEUR CLEAR 09/09/2021 0015   LABSPEC 1.008 09/09/2021 0015   PHURINE 6.0 09/09/2021 0015   GLUCOSEU NEGATIVE 09/09/2021 0015   HGBUR SMALL (A) 09/09/2021 0015   BILIRUBINUR NEGATIVE 09/09/2021 0015   BILIRUBINUR Small 10/11/2013 1200   KETONESUR NEGATIVE 09/09/2021 0015   PROTEINUR NEGATIVE 09/09/2021 0015   UROBILINOGEN 0.2 10/11/2013 1200   UROBILINOGEN 0.2 02/28/2008 1612   NITRITE NEGATIVE 09/09/2021 0015   LEUKOCYTESUR NEGATIVE 09/09/2021 0015   Sepsis Labs: @LABRCNTIP (procalcitonin:4,lacticidven:4)  )No results found for this or any previous visit (from the past 240 hour(s)).   Radiology Studies: No results found.   Scheduled Meds:  amLODipine  10 mg Oral Daily   empagliflozin  10 mg Oral Daily   enoxaparin (LOVENOX) injection  40 mg Subcutaneous Q24H   folic acid  1 mg Oral Daily   hydrALAZINE  50 mg Oral TID   isosorbide mononitrate  30 mg Oral Daily   multivitamin with minerals  1 tablet Oral Daily   sodium chloride flush  3 mL Intravenous Q12H   thiamine   100 mg Oral Daily   Or   thiamine  100 mg Intravenous Daily   Continuous Infusions:     LOS: 2 days    Time spent: 09/11/2021    , MD [facility]   03/20/2022, 12:47 PM

## 2022-03-20 NOTE — Progress Notes (Signed)
CSW acknowledges consult for substance use resources. CSW following to speak with patient when appropriate.

## 2022-03-21 DIAGNOSIS — I5033 Acute on chronic diastolic (congestive) heart failure: Secondary | ICD-10-CM | POA: Diagnosis not present

## 2022-03-21 LAB — CBC
HCT: 44.9 % (ref 36.0–46.0)
Hemoglobin: 15.4 g/dL — ABNORMAL HIGH (ref 12.0–15.0)
MCH: 30.9 pg (ref 26.0–34.0)
MCHC: 34.3 g/dL (ref 30.0–36.0)
MCV: 90 fL (ref 80.0–100.0)
Platelets: 299 10*3/uL (ref 150–400)
RBC: 4.99 MIL/uL (ref 3.87–5.11)
RDW: 15.4 % (ref 11.5–15.5)
WBC: 7.4 10*3/uL (ref 4.0–10.5)
nRBC: 0 % (ref 0.0–0.2)

## 2022-03-21 LAB — COMPREHENSIVE METABOLIC PANEL
ALT: 53 U/L — ABNORMAL HIGH (ref 0–44)
AST: 30 U/L (ref 15–41)
Albumin: 2.7 g/dL — ABNORMAL LOW (ref 3.5–5.0)
Alkaline Phosphatase: 119 U/L (ref 38–126)
Anion gap: 11 (ref 5–15)
BUN: 18 mg/dL (ref 6–20)
CO2: 25 mmol/L (ref 22–32)
Calcium: 9.3 mg/dL (ref 8.9–10.3)
Chloride: 106 mmol/L (ref 98–111)
Creatinine, Ser: 1.25 mg/dL — ABNORMAL HIGH (ref 0.44–1.00)
GFR, Estimated: 49 mL/min — ABNORMAL LOW (ref >60)
Glucose, Bld: 100 mg/dL — ABNORMAL HIGH (ref 70–99)
Potassium: 4.3 mmol/L (ref 3.5–5.1)
Sodium: 142 mmol/L (ref 135–145)
Total Bilirubin: 1.2 mg/dL (ref 0.3–1.2)
Total Protein: 6.6 g/dL (ref 6.5–8.1)

## 2022-03-21 LAB — GLUCOSE, CAPILLARY
Glucose-Capillary: 114 mg/dL — ABNORMAL HIGH (ref 70–99)
Glucose-Capillary: 144 mg/dL — ABNORMAL HIGH (ref 70–99)

## 2022-03-21 MED ORDER — DIAZEPAM 2 MG PO TABS
2.0000 mg | ORAL_TABLET | Freq: Two times a day (BID) | ORAL | Status: AC
  Administered 2022-03-21 (×2): 2 mg via ORAL
  Filled 2022-03-21 (×2): qty 1

## 2022-03-21 NOTE — Progress Notes (Signed)
PROGRESS NOTE    Amanda [name]  [id number] DOB: [birth year] DOA: 03/17/2022 PCP: Patient, No Pcp Per  Amanda [name] is a 61/F with history of chronic diastolic CHF, CKD 3a, hypertension, cocaine and alcohol abuse, noncompliance, presented to the ED with 2 weeks of chest discomfort, shortness of breath, cough and intermittent chest pain, last used cocaine about a week ago.  Has not been taking her medications consistently, does not have a PCP or cardiologist.  In the ED BP elevated, labs with BNP 1526, chest x-ray with pulmonary vascular congestion, mild interstitial edema, creatinine 1.12, troponin 53 and 54  -12/7 evening onwards, significant withdrawal symptoms, started on CIWA  Subjective: -Start got a couple of doses of Ativan overnight, starting to wake up this morning  Assessment and Plan:  Acute on chronic HFpEF  -Long history of noncompliance, recurrent admissions with hypertensive urgency, pulmonary edema and cocaine use -Last echo 5/23 with EF of 55-60%, mild LVH, grade 2 diastolic dysfunction -Holding further IV Lasix, clinically appears euvolemic, continue Jardiance, continue to hold Lasix and Aldactone with current mental status -Agreeable to TOC follow-up  Alcohol withdrawal -Obtunded on Ativan, will start low-dose scheduled Valium with goal to use less Ativan if possible -Continue thiamine   Hypertensive crisis   - BP as high as 213/139 in ED, worsened in the setting of cocaine -Still elevated, continue Norvasc, hydralazine, Imdur, weaned off nitro gtt. -Lasix as above   CKD IIIa  - SCr is 1.12 on admission, appears close to baseline  - Renally-dose medications, monitor closely while diuresing      Elevated troponin  - Troponin 53 and then 54 in ED, not a pattern suggestive of ACS but likely d/t severe HTN and acute CHF    Cocaine abuse - Reports last use was 1-2 weeks ago    - Counseled  Alcohol abuse   SDOH -Poor health literacy, social support,  polysubstance abuse, including cocaine and alcohol, noncompliance, TOC follow-up scheduled     DVT prophylaxis: Lovenox  Code Status: Full  Family Communication: None present Disposition Plan: Home when improved  Consultants:    Procedures:   Antimicrobials:    Objective: Vitals:   03/21/22 0344 03/21/22 0403 03/21/22 0451 03/21/22 0816  BP: (!) 181/144  (!) 169/103 (!) 165/94  Pulse: (!) 104  81 (!) 102  Resp: (!) 26   19  Temp: 97.6 F (36.4 C)   97.8 F (36.6 C)  TempSrc: Axillary   Axillary  SpO2: 96%  97% 97%  Weight:  62.5 kg    Height:        Intake/Output Summary (Last 24 hours) at 03/21/2022 1130 Last data filed at 03/21/2022 0400 Gross per 24 hour  Intake 240 ml  Output 550 ml  Net -310 ml   Filed Weights   03/19/22 0358 03/20/22 0500 03/21/22 0403  Weight: 61.6 kg 62.1 kg 62.5 kg    Examination:  General exam: Somnolent, more arousable today, opens eyes and mumbles few words CVS: S1-S2, regular rhythm, tachycardic Lungs: Poor air movement bilaterally Abdomen: Soft, nontender, bowel sounds present Remedies: No edema Neuro: Somnolent but follows some commands, moves all extremities, no localizing signs Skin: No rashes Psychiatry: Unable to assess    Data Reviewed:   CBC: Recent Labs  Lab 03/16/22 1749 03/17/22 1646 03/18/22 0318 03/19/22 0301 03/21/22 0137  WBC 7.0 7.0 7.2 8.9 7.4  HGB 14.9 14.8 15.5* 14.1 15.4*  HCT 44.5 44.2 45.6 42.3 44.9  MCV 91.6 90.8 92.1  91.0 90.0  PLT 267 282 279 258 299   Basic Metabolic Panel: Recent Labs  Lab 03/17/22 1646 03/18/22 0318 03/19/22 0301 03/20/22 0636 03/21/22 0137  NA 136 141 136 139 142  K 3.4* 3.7 3.3* 3.9 4.3  CL 104 106 97* 100 106  CO2 23 22 27 26 25   GLUCOSE 211* 124* 119* 109* 100*  BUN 17 21* 23* 16 18  CREATININE 1.12* 1.20* 1.47* 1.01* 1.25*  CALCIUM 8.7* 8.9 8.6* 9.2 9.3  MG  --  1.6*  --  2.1  --    GFR: Estimated Creatinine Clearance: 41.3 mL/min (A) (by C-G formula  based on SCr of 1.25 mg/dL (H)). Liver Function Tests: Recent Labs  Lab 03/16/22 2220 03/18/22 2210 03/21/22 0137  AST 104* 73* 30  ALT 124* 103* 53*  ALKPHOS 230* 183* 119  BILITOT 0.6 0.7 1.2  PROT 7.1 6.3* 6.6  ALBUMIN 3.1* 2.8* 2.7*   Recent Labs  Lab 03/16/22 2220  LIPASE 28   No results for input(s): "AMMONIA" in the last 168 hours. Coagulation Profile: No results for input(s): "INR", "PROTIME" in the last 168 hours. Cardiac Enzymes: No results for input(s): "CKTOTAL", "CKMB", "CKMBINDEX", "TROPONINI" in the last 168 hours. BNP (last 3 results) No results for input(s): "PROBNP" in the last 8760 hours. HbA1C: No results for input(s): "HGBA1C" in the last 72 hours. CBG: Recent Labs  Lab 03/20/22 2030 03/21/22 0544  GLUCAP 156* 114*   Lipid Profile: No results for input(s): "CHOL", "HDL", "LDLCALC", "TRIG", "CHOLHDL", "LDLDIRECT" in the last 72 hours. Thyroid Function Tests: No results for input(s): "TSH", "T4TOTAL", "FREET4", "T3FREE", "THYROIDAB" in the last 72 hours. Anemia Panel: No results for input(s): "VITAMINB12", "FOLATE", "FERRITIN", "TIBC", "IRON", "RETICCTPCT" in the last 72 hours. Urine analysis:    Component Value Date/Time   COLORURINE STRAW (A) 09/09/2021 0015   APPEARANCEUR CLEAR 09/09/2021 0015   LABSPEC 1.008 09/09/2021 0015   PHURINE 6.0 09/09/2021 0015   GLUCOSEU NEGATIVE 09/09/2021 0015   HGBUR SMALL (A) 09/09/2021 0015   BILIRUBINUR NEGATIVE 09/09/2021 0015   BILIRUBINUR Small 10/11/2013 1200   KETONESUR NEGATIVE 09/09/2021 0015   PROTEINUR NEGATIVE 09/09/2021 0015   UROBILINOGEN 0.2 10/11/2013 1200   UROBILINOGEN 0.2 02/28/2008 1612   NITRITE NEGATIVE 09/09/2021 0015   LEUKOCYTESUR NEGATIVE 09/09/2021 0015   Sepsis Labs: @LABRCNTIP (procalcitonin:4,lacticidven:4)  )No results found for this or any previous visit (from the past 240 hour(s)).   Radiology Studies: No results found.   Scheduled Meds:  amLODipine  10 mg Oral  Daily   diazepam  2 mg Oral BID   empagliflozin  10 mg Oral Daily   enoxaparin (LOVENOX) injection  40 mg Subcutaneous Q24H   folic acid  1 mg Oral Daily   hydrALAZINE  50 mg Oral TID   isosorbide mononitrate  30 mg Oral Daily   multivitamin with minerals  1 tablet Oral Daily   sodium chloride flush  3 mL Intravenous Q12H   thiamine  100 mg Oral Daily   Or   thiamine  100 mg Intravenous Daily   Continuous Infusions:     LOS: 3 days    Time spent: 09/11/2021  , MD [facility]   03/21/2022, 11:30 AM

## 2022-03-21 NOTE — Plan of Care (Signed)
  Problem: Clinical Measurements: Goal: Respiratory complications will improve Outcome: Progressing   Problem: Clinical Measurements: Goal: Cardiovascular complication will be avoided Outcome: Progressing   Problem: Coping: Goal: Level of anxiety will decrease Outcome: Progressing   Problem: Pain Managment: Goal: General experience of comfort will improve Outcome: Progressing   Problem: Safety: Goal: Ability to remain free from injury will improve Outcome: Progressing   Problem: Skin Integrity: Goal: Risk for impaired skin integrity will decrease Outcome: Progressing   

## 2022-03-22 ENCOUNTER — Inpatient Hospital Stay (HOSPITAL_COMMUNITY): Payer: Medicaid Other

## 2022-03-22 DIAGNOSIS — I5033 Acute on chronic diastolic (congestive) heart failure: Secondary | ICD-10-CM | POA: Diagnosis not present

## 2022-03-22 LAB — CBC
HCT: 48.6 % — ABNORMAL HIGH (ref 36.0–46.0)
Hemoglobin: 16 g/dL — ABNORMAL HIGH (ref 12.0–15.0)
MCH: 30.2 pg (ref 26.0–34.0)
MCHC: 32.9 g/dL (ref 30.0–36.0)
MCV: 91.7 fL (ref 80.0–100.0)
Platelets: 330 10*3/uL (ref 150–400)
RBC: 5.3 MIL/uL — ABNORMAL HIGH (ref 3.87–5.11)
RDW: 15.2 % (ref 11.5–15.5)
WBC: 6.6 10*3/uL (ref 4.0–10.5)
nRBC: 0 % (ref 0.0–0.2)

## 2022-03-22 LAB — COMPREHENSIVE METABOLIC PANEL
ALT: 42 U/L (ref 0–44)
AST: 26 U/L (ref 15–41)
Albumin: 2.8 g/dL — ABNORMAL LOW (ref 3.5–5.0)
Alkaline Phosphatase: 117 U/L (ref 38–126)
Anion gap: 14 (ref 5–15)
BUN: 21 mg/dL — ABNORMAL HIGH (ref 6–20)
CO2: 20 mmol/L — ABNORMAL LOW (ref 22–32)
Calcium: 9.2 mg/dL (ref 8.9–10.3)
Chloride: 106 mmol/L (ref 98–111)
Creatinine, Ser: 1.21 mg/dL — ABNORMAL HIGH (ref 0.44–1.00)
GFR, Estimated: 51 mL/min — ABNORMAL LOW (ref >60)
Glucose, Bld: 124 mg/dL — ABNORMAL HIGH (ref 70–99)
Potassium: 4 mmol/L (ref 3.5–5.1)
Sodium: 140 mmol/L (ref 135–145)
Total Bilirubin: 1.2 mg/dL (ref 0.3–1.2)
Total Protein: 6.9 g/dL (ref 6.5–8.1)

## 2022-03-22 LAB — GLUCOSE, CAPILLARY
Glucose-Capillary: 306 mg/dL — ABNORMAL HIGH (ref 70–99)
Glucose-Capillary: 113 mg/dL — ABNORMAL HIGH (ref 70–99)
Glucose-Capillary: 131 mg/dL — ABNORMAL HIGH (ref 70–99)
Glucose-Capillary: 181 mg/dL — ABNORMAL HIGH (ref 70–99)

## 2022-03-22 MED ORDER — LORAZEPAM 1 MG PO TABS: 1.0000 mg | ORAL_TABLET | ORAL | Status: DC | PRN

## 2022-03-22 MED ORDER — FUROSEMIDE 10 MG/ML IJ SOLN
40.0000 mg | Freq: Four times a day (QID) | INTRAMUSCULAR | Status: AC
  Administered 2022-03-22 (×2): 40 mg via INTRAVENOUS
  Filled 2022-03-22 (×2): qty 4

## 2022-03-22 MED ORDER — LORAZEPAM 2 MG/ML IJ SOLN
1.0000 mg | INTRAMUSCULAR | Status: DC | PRN
  Administered 2022-03-22 – 2022-03-23 (×2): 1 mg via INTRAVENOUS
  Filled 2022-03-22 (×2): qty 1

## 2022-03-22 MED ORDER — ISOSORBIDE MONONITRATE ER 60 MG PO TB24
60.0000 mg | ORAL_TABLET | Freq: Every day | ORAL | Status: DC
  Administered 2022-03-22 – 2022-03-26 (×4): 60 mg via ORAL
  Filled 2022-03-22 (×5): qty 1

## 2022-03-22 MED ORDER — POTASSIUM CHLORIDE CRYS ER 20 MEQ PO TBCR
40.0000 meq | EXTENDED_RELEASE_TABLET | Freq: Once | ORAL | Status: DC
  Filled 2022-03-22: qty 2

## 2022-03-22 MED ORDER — INSULIN ASPART 100 UNIT/ML IJ SOLN
0.0000 [IU] | Freq: Three times a day (TID) | INTRAMUSCULAR | Status: DC
  Administered 2022-03-22 – 2022-03-24 (×3): 1 [IU] via SUBCUTANEOUS
  Administered 2022-03-24 – 2022-03-25 (×2): 2 [IU] via SUBCUTANEOUS

## 2022-03-22 MED ORDER — POTASSIUM CHLORIDE 10 MEQ/100ML IV SOLN
10.0000 meq | INTRAVENOUS | Status: AC
  Administered 2022-03-22 – 2022-03-23 (×2): 10 meq via INTRAVENOUS
  Filled 2022-03-22 (×2): qty 100

## 2022-03-22 NOTE — Progress Notes (Signed)
PROGRESS NOTE    Amanda [name]  GYI:[id number] DOB: January 07, 1961 DOA: 03/17/2022 PCP: Patient, No Pcp Per  Amanda [name] is a 61/F with history of chronic diastolic CHF, CKD 3a, hypertension, cocaine and alcohol abuse, noncompliance, presented to the ED with 2 weeks of chest discomfort, shortness of breath, cough and intermittent chest pain, last used cocaine about a week ago.  Has not been taking her medications consistently, does not have a PCP or cardiologist.  In the ED BP elevated, labs with BNP 1526, chest x-ray with pulmonary vascular congestion, mild interstitial edema, creatinine 1.12, troponin 53 and 54  -12/7 evening onwards, significant withdrawal symptoms, started on CIWA  Subjective: -Required less Ativan last night, more awake this morning, blood pressure significantly elevated  Assessment and Plan:  Acute on chronic HFpEF  -Long history of noncompliance, recurrent admissions with hypertensive urgency, pulmonary edema and cocaine use -Last echo 5/23 with EF of 55-60%, mild LVH, grade 2 diastolic dysfunction -Appears volume overloaded again, will restart IV Lasix, continue Jardiance, restart Aldactone tomorrow  -Agreeable to TOC follow-up  Alcohol withdrawal -Slowly improving, continue CIWA protocol with Ativan -Continue thiamine -PT OT eval tomorrow   Hypertensive crisis   - BP as high as 213/139 in ED, worsened in the setting of cocaine -on Norvasc, hydralazine, Imdur,  off nitro gtt. -Blood pressure trending up again, appears volume overloaded as well, IV Lasix today, increase Imdur   CKD IIIa  -Stable    Elevated troponin  - Troponin 53 and then 54 in ED, not a pattern suggestive of ACS but likely d/t severe HTN and acute CHF    Cocaine abuse - Reports last use was 1-2 weeks ago    - Counseled  Alcohol abuse   SDOH -Poor health literacy, social support, polysubstance abuse, including cocaine and alcohol, noncompliance, TOC follow-up scheduled     DVT  prophylaxis: Lovenox  Code Status: Full  Family Communication: None present Disposition Plan: Home when improved, likely 48 hours  Consultants:    Procedures:   Antimicrobials:    Objective: Vitals:   03/22/22 0408 03/22/22 0420 03/22/22 0749 03/22/22 0851  BP: (!) 191/116 (!) 153/103 (!) 204/116 (!) 200/98  Pulse: (!) 57 (!) 53 (!) 107 (!) 51  Resp: 20  19   Temp: (!) 97.2 F (36.2 C)  (!) 97.4 F (36.3 C)   TempSrc: Axillary  Axillary   SpO2: 100% 98% 98% 91%  Weight:      Height:        Intake/Output Summary (Last 24 hours) at 03/22/2022 1111 Last data filed at 03/22/2022 0300 Gross per 24 hour  Intake 200 ml  Output 700 ml  Net -500 ml   Filed Weights   03/20/22 0500 03/21/22 0403 03/22/22 0350  Weight: 62.1 kg 62.5 kg 57.4 kg    Examination:  General exam: Somnolent, more arousable today, opens eyes and mumbles few words CVS: S1-S2, regular rhythm, tachycardic Lungs: Poor air movement bilaterally Abdomen: Soft, nontender, bowel sounds present Remedies: No edema Neuro: Somnolent but follows some commands, moves all extremities, no localizing signs Skin: No rashes Psychiatry: Unable to assess    Data Reviewed:   CBC: Recent Labs  Lab 03/17/22 1646 03/18/22 0318 03/19/22 0301 03/21/22 0137 03/22/22 0142  WBC 7.0 7.2 8.9 7.4 6.6  HGB 14.8 15.5* 14.1 15.4* 16.0*  HCT 44.2 45.6 42.3 44.9 48.6*  MCV 90.8 92.1 91.0 90.0 91.7  PLT 282 279 258 299 330   Basic Metabolic Panel: Recent  Labs  Lab 03/18/22 0318 03/19/22 0301 03/20/22 0636 03/21/22 0137 03/22/22 0142  NA 141 136 139 142 140  K 3.7 3.3* 3.9 4.3 4.0  CL 106 97* 100 106 106  CO2 22 27 26 25  20*  GLUCOSE 124* 119* 109* 100* 124*  BUN 21* 23* 16 18 21*  CREATININE 1.20* 1.47* 1.01* 1.25* 1.21*  CALCIUM 8.9 8.6* 9.2 9.3 9.2  MG 1.6*  --  2.1  --   --    GFR: Estimated Creatinine Clearance: 42.7 mL/min (A) (by C-G formula based on SCr of 1.21 mg/dL (H)). Liver Function  Tests: Recent Labs  Lab 03/16/22 2220 03/18/22 2210 03/21/22 0137 03/22/22 0142  AST 104* 73* 30 26  ALT 124* 103* 53* 42  ALKPHOS 230* 183* 119 117  BILITOT 0.6 0.7 1.2 1.2  PROT 7.1 6.3* 6.6 6.9  ALBUMIN 3.1* 2.8* 2.7* 2.8*   Recent Labs  Lab 03/16/22 2220  LIPASE 28   No results for input(s): "AMMONIA" in the last 168 hours. Coagulation Profile: No results for input(s): "INR", "PROTIME" in the last 168 hours. Cardiac Enzymes: No results for input(s): "CKTOTAL", "CKMB", "CKMBINDEX", "TROPONINI" in the last 168 hours. BNP (last 3 results) No results for input(s): "PROBNP" in the last 8760 hours. HbA1C: No results for input(s): "HGBA1C" in the last 72 hours. CBG: Recent Labs  Lab 03/20/22 2030 03/21/22 0544 03/21/22 2119 03/22/22 0753  GLUCAP 156* 114* 144* 306*   Lipid Profile: No results for input(s): "CHOL", "HDL", "LDLCALC", "TRIG", "CHOLHDL", "LDLDIRECT" in the last 72 hours. Thyroid Function Tests: No results for input(s): "TSH", "T4TOTAL", "FREET4", "T3FREE", "THYROIDAB" in the last 72 hours. Anemia Panel: No results for input(s): "VITAMINB12", "FOLATE", "FERRITIN", "TIBC", "IRON", "RETICCTPCT" in the last 72 hours. Urine analysis:    Component Value Date/Time   COLORURINE STRAW (A) 09/09/2021 0015   APPEARANCEUR CLEAR 09/09/2021 0015   LABSPEC 1.008 09/09/2021 0015   PHURINE 6.0 09/09/2021 0015   GLUCOSEU NEGATIVE 09/09/2021 0015   HGBUR SMALL (A) 09/09/2021 0015   BILIRUBINUR NEGATIVE 09/09/2021 0015   BILIRUBINUR Small 10/11/2013 1200   KETONESUR NEGATIVE 09/09/2021 0015   PROTEINUR NEGATIVE 09/09/2021 0015   UROBILINOGEN 0.2 10/11/2013 1200   UROBILINOGEN 0.2 02/28/2008 1612   NITRITE NEGATIVE 09/09/2021 0015   LEUKOCYTESUR NEGATIVE 09/09/2021 0015   Sepsis Labs: @LABRCNTIP (procalcitonin:4,lacticidven:4)  )No results found for this or any previous visit (from the past 240 hour(s)).   Radiology Studies: DG CHEST PORT 1 VIEW  Result Date:  03/22/2022 CLINICAL DATA:  Hypoxia and CHF. EXAM: PORTABLE CHEST 1 VIEW COMPARISON:  01/15/2022 FINDINGS: Stable cardiac enlargement. The no signs of pleural fluid. Mild diffuse increase interstitial markings compatible with pulmonary edema. This appears stable from the prior exam. No airspace consolidation. Visualized osseous structures are unremarkable. IMPRESSION: Unchanged cardiac enlargement and mild diffuse interstitial edema. Electronically Signed   By: 14/01/2022 M.D.   On: 03/22/2022 10:19     Scheduled Meds:  amLODipine  10 mg Oral Daily   empagliflozin  10 mg Oral Daily   enoxaparin (LOVENOX) injection  40 mg Subcutaneous Q24H   folic acid  1 mg Oral Daily   furosemide  40 mg Intravenous Q6H   hydrALAZINE  50 mg Oral TID   insulin aspart  0-9 Units Subcutaneous TID WC   isosorbide mononitrate  60 mg Oral Daily   multivitamin with minerals  1 tablet Oral Daily   potassium chloride  40 mEq Oral Once   sodium chloride flush  3 mL Intravenous Q12H   thiamine  100 mg Oral Daily   Or   thiamine  100 mg Intravenous Daily   Continuous Infusions:   LOS: 4 days    Time spent:  Zannie Cove, MD [facility]   03/22/2022, 11:11 AM

## 2022-03-22 NOTE — Progress Notes (Signed)
   03/21/22 2119  Assess: MEWS Score  BP (!) 189/113  MAP (mmHg) 136  Pulse Rate (!) 57  ECG Heart Rate (!) 114  Resp (!) 24  SpO2 95 %  O2 Device Room Air  Assess: MEWS Score  MEWS Temp 0  MEWS Systolic 0  MEWS Pulse 2  MEWS RR 1  MEWS LOC 0  MEWS Score 3  MEWS Score Color Yellow  Assess: if the MEWS score is Yellow or Red  Were vital signs taken at a resting state? Yes  Does the patient meet 2 or more of the SIRS criteria? Yes  Does the patient have a confirmed or suspected source of infection? No  MEWS guidelines implemented *See Row Information* Yes  Treat  MEWS Interventions Administered scheduled meds/treatments  Pain Scale 0-10  Pain Score 0  Take Vital Signs  Increase Vital Sign Frequency  Yellow: Q 2hr X 2 then Q 4hr X 2, if remains yellow, continue Q 4hrs  Escalate  MEWS: Escalate Yellow: discuss with charge nurse/RN and consider discussing with provider and RRT  Notify: Charge Nurse/RN  Name of Charge Nurse/RN Notified Insurance claims handler  Date Charge Nurse/RN Notified 03/21/22  Document  Patient Outcome Stabilized after interventions  Progress note created (see row info) Yes  Assess: SIRS CRITERIA  SIRS Temperature  0  SIRS Pulse 1  SIRS Respirations  1  SIRS WBC 1  SIRS Score Sum  3

## 2022-03-22 NOTE — Progress Notes (Signed)
   03/21/22 2340  Assess: MEWS Score  Temp 98.4 F (36.9 C)  BP (!) 200/102  MAP (mmHg) 130  Pulse Rate (!) 54  ECG Heart Rate (!) 111  Resp (!) 26  SpO2 94 %  O2 Device Room Air  Assess: MEWS Score  MEWS Temp 0  MEWS Systolic 0  MEWS Pulse 2  MEWS RR 2  MEWS LOC 0  MEWS Score 4  MEWS Score Color Red  Assess: if the MEWS score is Yellow or Red  Were vital signs taken at a resting state? Yes  Focused Assessment No change from prior assessment  Does the patient meet 2 or more of the SIRS criteria? Yes  Does the patient have a confirmed or suspected source of infection? No  MEWS guidelines implemented *See Row Information* Yes  Treat  MEWS Interventions Administered prn meds/treatments;Escalated (See documentation below)  Pain Scale 0-10  Pain Score 0  Take Vital Signs  Increase Vital Sign Frequency  Red: Q 1hr X 4 then Q 4hr X 4, if remains red, continue Q 4hrs  Escalate  MEWS: Escalate Red: discuss with charge nurse/RN and provider, consider discussing with RRT  Notify: Charge Nurse/RN  Name of Charge Nurse/RN Notified Insurance claims handler  Provider Notification  Provider Name/Title Dr. Imogene Burn  Date Provider Notified 03/21/22  Time Provider Notified 2357  Method of Notification  (secure chat)  Notification Reason Other (Comment) (red mews. on ciwa. HR 110s and BP 200/102. RR 26-30, wheezy.  giving PRN ativan, labetalol and xopenex neb)  Provider response No new orders  Date of Provider Response 03/22/22  Time of Provider Response 0005  Document  Patient Outcome Stabilized after interventions  Progress note created (see row info) Yes  Assess: SIRS CRITERIA  SIRS Temperature  0  SIRS Pulse 1  SIRS Respirations  1  SIRS WBC 1  SIRS Score Sum  3

## 2022-03-23 LAB — COMPREHENSIVE METABOLIC PANEL
ALT: 36 U/L (ref 0–44)
AST: 27 U/L (ref 15–41)
Albumin: 3.1 g/dL — ABNORMAL LOW (ref 3.5–5.0)
Alkaline Phosphatase: 123 U/L (ref 38–126)
Anion gap: 14 (ref 5–15)
BUN: 21 mg/dL — ABNORMAL HIGH (ref 6–20)
CO2: 21 mmol/L — ABNORMAL LOW (ref 22–32)
Calcium: 9 mg/dL (ref 8.9–10.3)
Chloride: 102 mmol/L (ref 98–111)
Creatinine, Ser: 1.27 mg/dL — ABNORMAL HIGH (ref 0.44–1.00)
GFR, Estimated: 48 mL/min — ABNORMAL LOW (ref >60)
Glucose, Bld: 139 mg/dL — ABNORMAL HIGH (ref 70–99)
Potassium: 3.9 mmol/L (ref 3.5–5.1)
Sodium: 137 mmol/L (ref 135–145)
Total Bilirubin: 0.7 mg/dL (ref 0.3–1.2)
Total Protein: 7.8 g/dL (ref 6.5–8.1)

## 2022-03-23 LAB — CBC
HCT: 51.4 % — ABNORMAL HIGH (ref 36.0–46.0)
Hemoglobin: 17.6 g/dL — ABNORMAL HIGH (ref 12.0–15.0)
MCH: 30.7 pg (ref 26.0–34.0)
MCHC: 34.2 g/dL (ref 30.0–36.0)
MCV: 89.7 fL (ref 80.0–100.0)
Platelets: 331 10*3/uL (ref 150–400)
RBC: 5.73 MIL/uL — ABNORMAL HIGH (ref 3.87–5.11)
RDW: 14.8 % (ref 11.5–15.5)
WBC: 5.5 10*3/uL (ref 4.0–10.5)
nRBC: 0 % (ref 0.0–0.2)

## 2022-03-23 LAB — GLUCOSE, CAPILLARY
Glucose-Capillary: 101 mg/dL — ABNORMAL HIGH (ref 70–99)
Glucose-Capillary: 128 mg/dL — ABNORMAL HIGH (ref 70–99)
Glucose-Capillary: 106 mg/dL — ABNORMAL HIGH (ref 70–99)
Glucose-Capillary: 227 mg/dL — ABNORMAL HIGH (ref 70–99)

## 2022-03-23 LAB — HEMOGLOBIN A1C
Hgb A1c MFr Bld: 6.9 % — ABNORMAL HIGH (ref 4.8–5.6)
Mean Plasma Glucose: 151 mg/dL

## 2022-03-23 LAB — AMMONIA: Ammonia: 35 umol/L (ref 9–35)

## 2022-03-23 MED ORDER — DILTIAZEM HCL ER 60 MG PO CP12
120.0000 mg | ORAL_CAPSULE | Freq: Two times a day (BID) | ORAL | Status: DC
  Administered 2022-03-23 – 2022-03-24 (×3): 120 mg via ORAL
  Filled 2022-03-23 (×5): qty 2

## 2022-03-23 MED ORDER — SPIRONOLACTONE 25 MG PO TABS
25.0000 mg | ORAL_TABLET | Freq: Every day | ORAL | Status: DC
  Administered 2022-03-23 – 2022-03-26 (×3): 25 mg via ORAL
  Filled 2022-03-23 (×4): qty 1

## 2022-03-23 NOTE — Progress Notes (Signed)
PROGRESS NOTE    Amanda [name]  QVZ:[id number] DOB: [birth year] DOA: 03/17/2022 PCP: Patient, No Pcp Per  Amanda [name] is a 61/F with history of chronic diastolic CHF, CKD 3a, hypertension, cocaine and alcohol abuse, noncompliance, presented to the ED with 2 weeks of chest discomfort, shortness of breath, cough and intermittent chest pain, last used cocaine about a week ago.  Has not been taking her medications consistently, does not have a PCP or cardiologist.  In the ED BP elevated, labs with BNP 1526, chest x-ray with pulmonary vascular congestion, mild interstitial edema, creatinine 1.12, troponin 53 and 54  -12/7 evening onwards, significant withdrawal symptoms, started on CIWA  Subjective: -Blood pressure significantly elevated, per RN did not get meds last night, more awake today  Assessment and Plan:  Acute on chronic HFpEF  -Long history of noncompliance, recurrent admissions with hypertensive urgency, pulmonary edema and cocaine use -Last echo 5/23 with EF of 55-60%, mild LVH, grade 2 diastolic dysfunction -Appears euvolemic, continue Jardiance, restart Aldactone -TOC follow-up arranged  Alcohol withdrawal -Slowly improving,  -Wean off Ativan today -Continue thiamine -Increase activity, PT OT   Hypertensive crisis   - BP as high as 213/139 in ED, worsened in the setting of cocaine -on Norvasc, hydralazine, Imdur,  off nitro gtt. -Blood pressure significantly elevated this morning, did not get meds overnight, increase Imdur, will reassess after morning meds, may need to increase hydralazine again, add Cardizem remains tachycardic as well   CKD IIIa  -Stable    Elevated troponin  - Troponin 53 and then 54 in ED, not a pattern suggestive of ACS but likely d/t severe HTN and acute CHF    Cocaine abuse - Reports last use was 1-2 weeks ago    - Counseled  Alcohol abuse   SDOH -Poor health literacy, social support, polysubstance abuse, including cocaine and alcohol,  noncompliance, TOC follow-up scheduled     DVT prophylaxis: Lovenox  Code Status: Full  Family Communication: None present Disposition Plan: Home in 1 to 2 days  Consultants:    Procedures:   Antimicrobials:    Objective: Vitals:   03/23/22 0442 03/23/22 0756 03/23/22 1144 03/23/22 1231  BP: (!) 157/104 (!) 193/132 130/71 (!) 143/88  Pulse: (!) 54 (!) 102  (!) 107  Resp: 20 19  20   Temp: 97.7 F (36.5 C) 97.6 F (36.4 C)  98.8 F (37.1 C)  TempSrc: Oral Oral  Oral  SpO2: 96%     Weight: 57.4 kg     Height:        Intake/Output Summary (Last 24 hours) at 03/23/2022 1333 Last data filed at 03/23/2022 0447 Gross per 24 hour  Intake 420 ml  Output 1400 ml  Net -980 ml   Filed Weights   03/21/22 0403 03/22/22 0350 03/23/22 0442  Weight: 62.5 kg 57.4 kg 57.4 kg    Examination:  General exam: Awake today, alert, oriented to self and partly to place, cognitive deficits CVS: S1-S2, regular rhythm and, tachycardic Lungs: Poor air movement bilaterally otherwise clear Abdomen: Soft, nontender, bowel sounds present Extremities: No edema  Neuro: Somnolent but follows some commands, moves all extremities, no localizing signs Skin: No rashes Psychiatry: Unable to assess    Data Reviewed:   CBC: Recent Labs  Lab 03/18/22 0318 03/19/22 0301 03/21/22 0137 03/22/22 0142 03/23/22 0251  WBC 7.2 8.9 7.4 6.6 5.5  HGB 15.5* 14.1 15.4* 16.0* 17.6*  HCT 45.6 42.3 44.9 48.6* 51.4*  MCV 92.1 91.0 90.0 91.7  89.7  PLT 279 258 299 330 331   Basic Metabolic Panel: Recent Labs  Lab 03/18/22 0318 03/19/22 0301 03/20/22 0636 03/21/22 0137 03/22/22 0142 03/23/22 0251  NA 141 136 139 142 140 137  K 3.7 3.3* 3.9 4.3 4.0 3.9  CL 106 97* 100 106 106 102  CO2 22 27 26 25  20* 21*  GLUCOSE 124* 119* 109* 100* 124* 139*  BUN 21* 23* 16 18 21* 21*  CREATININE 1.20* 1.47* 1.01* 1.25* 1.21* 1.27*  CALCIUM 8.9 8.6* 9.2 9.3 9.2 9.0  MG 1.6*  --  2.1  --   --   --     GFR: Estimated Creatinine Clearance: 40.7 mL/min (A) (by C-G formula based on SCr of 1.27 mg/dL (H)). Liver Function Tests: Recent Labs  Lab 03/16/22 2220 03/18/22 2210 03/21/22 0137 03/22/22 0142 03/23/22 0251  AST 104* 73* 30 26 27   ALT 124* 103* 53* 42 36  ALKPHOS 230* 183* 119 117 123  BILITOT 0.6 0.7 1.2 1.2 0.7  PROT 7.1 6.3* 6.6 6.9 7.8  ALBUMIN 3.1* 2.8* 2.7* 2.8* 3.1*   Recent Labs  Lab 03/16/22 2220  LIPASE 28   Recent Labs  Lab 03/23/22 0251  AMMONIA 35   Coagulation Profile: No results for input(s): "INR", "PROTIME" in the last 168 hours. Cardiac Enzymes: No results for input(s): "CKTOTAL", "CKMB", "CKMBINDEX", "TROPONINI" in the last 168 hours. BNP (last 3 results) No results for input(s): "PROBNP" in the last 8760 hours. HbA1C: Recent Labs    03/22/22 0142  HGBA1C 6.9*   CBG: Recent Labs  Lab 03/22/22 1242 03/22/22 1612 03/22/22 2105 03/23/22 0759 03/23/22 1228  GLUCAP 113* 131* 181* 101* 128*   Lipid Profile: No results for input(s): "CHOL", "HDL", "LDLCALC", "TRIG", "CHOLHDL", "LDLDIRECT" in the last 72 hours. Thyroid Function Tests: No results for input(s): "TSH", "T4TOTAL", "FREET4", "T3FREE", "THYROIDAB" in the last 72 hours. Anemia Panel: No results for input(s): "VITAMINB12", "FOLATE", "FERRITIN", "TIBC", "IRON", "RETICCTPCT" in the last 72 hours. Urine analysis:    Component Value Date/Time   COLORURINE STRAW (A) 09/09/2021 0015   APPEARANCEUR CLEAR 09/09/2021 0015   LABSPEC 1.008 09/09/2021 0015   PHURINE 6.0 09/09/2021 0015   GLUCOSEU NEGATIVE 09/09/2021 0015   HGBUR SMALL (A) 09/09/2021 0015   BILIRUBINUR NEGATIVE 09/09/2021 0015   BILIRUBINUR Small 10/11/2013 1200   KETONESUR NEGATIVE 09/09/2021 0015   PROTEINUR NEGATIVE 09/09/2021 0015   UROBILINOGEN 0.2 10/11/2013 1200   UROBILINOGEN 0.2 02/28/2008 1612   NITRITE NEGATIVE 09/09/2021 0015   LEUKOCYTESUR NEGATIVE 09/09/2021 0015   Sepsis  Labs: @LABRCNTIP (procalcitonin:4,lacticidven:4)  )No results found for this or any previous visit (from the past 240 hour(s)).   Radiology Studies: DG CHEST PORT 1 VIEW  Result Date: 03/22/2022 CLINICAL DATA:  Hypoxia and CHF. EXAM: PORTABLE CHEST 1 VIEW COMPARISON:  01/15/2022 FINDINGS: Stable cardiac enlargement. The no signs of pleural fluid. Mild diffuse increase interstitial markings compatible with pulmonary edema. This appears stable from the prior exam. No airspace consolidation. Visualized osseous structures are unremarkable. IMPRESSION: Unchanged cardiac enlargement and mild diffuse interstitial edema. Electronically Signed   By: M.D.   On: 03/22/2022 10:19     Scheduled Meds:  diltiazem  120 mg Oral Q12H   empagliflozin  10 mg Oral Daily   enoxaparin (LOVENOX) injection  40 mg Subcutaneous Q24H   folic acid  1 mg Oral Daily   hydrALAZINE  50 mg Oral TID   insulin aspart  0-9 Units Subcutaneous TID WC  isosorbide mononitrate  60 mg Oral Daily   multivitamin with minerals  1 tablet Oral Daily   potassium chloride  40 mEq Oral Once   sodium chloride flush  3 mL Intravenous Q12H   thiamine  100 mg Oral Daily   Or   thiamine  100 mg Intravenous Daily   Continuous Infusions:   LOS: 5 days    Time spent:  Zannie Cove, MD [facility]   03/23/2022, 1:33 PM

## 2022-03-23 NOTE — Progress Notes (Signed)
   Heart Failure Stewardship Pharmacist Progress Note   PCP: Patient, No Pcp Per PCP-Cardiologist: None    HPI:  61 yo AAF with PMH significant for HTN, polysubstance use (occasional cocaine, tobacco, marijuana, alcohol), CKD, and CHF.  Hospitalized in May with acute heart failure and hypertensive urgency. She reported medication non-adherence for years due to cost. UDS was positive for cocaine. Her Echo on 05/30 revealed LVEF 55-60% with mild LVH, G2DD, normal RV function, severe biatrial dilation, and mild MR. Diuresed, started on GDMT, and discharged. Did not show for HF TOC follow up.  She presented to the ED on 12/4 with hypertension. Discharged and advised to take her BP medications.  Back in the ED on 12/5 with worsening shortness of breath and hypertensive urgency, significant orthopnea. CXR showed central vascular congestion and interstitial pulmonary edema. Reported cocaine use about 1-2 weeks ago. No UDS collected on admission.  Current HF Medications: SGLT2i: Jardiance 10 mg daily Other: hydralazine 50 mg TID + Imdur 60 mg daily  Prior to admission HF Medications: None - not taking any meds (Jardiance, losartan, amlodipine)  Pertinent Lab Values: Serum creatinine 1.27, BUN 21, Potassium 3.9, Sodium 137, BNP 1525.6, Magnesium 2.1, A1c 6.8   Vital Signs: Weight: 126 lbs (admission weight: 135 lbs) Blood pressure: 150-190/100s  Heart rate: 110s  I/O: -0.9L yesterday; net -4.3L  Medication Assistance / Insurance Benefits Check: Does the patient have prescription insurance?  Yes Type of insurance plan:  Medicaid  Outpatient Pharmacy:  Prior to admission outpatient pharmacy: [facility] Is the patient willing to use [facility] TOC pharmacy at discharge? Yes Is the patient willing to transition their outpatient pharmacy to utilize a [facility] outpatient pharmacy?   Pending     Assessment: 1. Acute on chronic diastolic CHF (LVEF 55-60%), due to presumed NICM. No ischemic  evaluation done. NYHA class III symptoms. - Off IV lasix. Strict I/Os and daily weights. Keep K>4 and Mag>2.  - Creatinine stable today, baseline ~1.2. Consider starting MRA  - Continue Jardiance 10 mg daily - On hydralazine 50 mg TID + Imdur 60 mg daily, consider increasing hydralazine to 100 mg TID with persistent HTN  Plan: 1) Medication changes recommended at this time: - Start spironolactone 25 mg daily - Increase hydralazine to 100 mg TID  2) Patient assistance: - Prior authorization required for Jardiance, approved - Entresto also requires prior authorization, will complete if added  3)  Education  - Patient has been educated on current HF medications and potential additions to HF medication regimen - Patient verbalizes understanding that over the next few months, these medication doses may change and more medications may be added to optimize HF regimen - Patient has been educated on basic disease state pathophysiology and goals of therapy   Sharen Hones, PharmD, BCPS Heart Failure Engineer, building services Phone [phone]

## 2022-03-23 NOTE — Evaluation (Signed)
Occupational Therapy Evaluation Patient Details Name: Amanda [name] MRN: [id number] DOB: 12/10/1960 Today's Date: 03/23/2022   History of Present Illness 61 yo female presented to ED 12/4 with HTN d/c'd and advised to take her BP medication, returned to ED 12/5 with worsening SoB and hypertensive urgency significant orthopnea. CXR showed central vascular congestion and interstitial pulmonary edema. Pt admitted for treatment. Reported cocaine use about 1-2 weeks ago. PMH:HTN, polysubstance use (occasional cocaine, tobacco, marijuana, alcohol), CKD, and CHF.   Clinical Impression   Amanda [name] was evaluated s/p the above admission list, per pt she is generally mod I at baseline and indep with ADLs. Upon evaluation she demonstrated functional limitations due to impaired cognition, lethargy, impulsivity, generalized weakness and decreased activity tolerance. Due to deficits listed below she requires up to mod A fro mobility and ADLs. Pt initially agreeable to transfer OOB for grooming, however once transferred to EOB pt immediately transferred back supine and perseverated on combing her hair. Anticipate good progress once cognition clears, recommend d/c to home with support of family and HHOT.     Recommendations for follow up therapy are one component of a multi-disciplinary discharge planning process, led by the attending physician.  Recommendations may be updated based on patient status, additional functional criteria and insurance authorization.   Follow Up Recommendations  Home health OT (anticipate good progress acutely. May need to consider SNF if cognition does not imporve)     Assistance Recommended at Discharge Frequent or constant Supervision/Assistance  Patient can return home with the following A lot of help with walking and/or transfers;A lot of help with bathing/dressing/bathroom;Assistance with cooking/housework;Help with stairs or ramp for entrance;Assist for transportation     Functional Status Assessment  Patient has had a recent decline in their functional status and demonstrates the ability to make significant improvements in function in a reasonable and predictable amount of time.  Equipment Recommendations  None recommended by OT    Recommendations for Other Services       Precautions / Restrictions Precautions Precautions: Fall Precaution Comments: telesitter, posey belt in bed and fall mats in place Restrictions Weight Bearing Restrictions: No      Mobility Bed Mobility Overal bed mobility: Needs Assistance Bed Mobility: Supine to Sit, Sit to Supine     Supine to sit: Min assist Sit to supine: Min guard        Transfers Overall transfer level: Needs assistance                 General transfer comment: deferred, pt declined      Balance Overall balance assessment: Needs assistance Sitting-balance support: Feet supported Sitting balance-Leahy Scale: Fair Sitting balance - Comments: only tolerated a few seconds of sitting                                   ADL either performed or assessed with clinical judgement   ADL Overall ADL's : Needs assistance/impaired Eating/Feeding: Set up;Sitting   Grooming: Moderate assistance;Sitting   Upper Body Bathing: Moderate assistance;Sitting   Lower Body Bathing: Moderate assistance;Sit to/from stand   Upper Body Dressing : Moderate assistance;Sitting   Lower Body Dressing: Moderate assistance;Sit to/from stand   Toilet Transfer: Moderate assistance;Stand-pivot;Rolling walker (2 wheels);BSC/3in1   Toileting- Clothing Manipulation and Hygiene: Supervision/safety;Sitting/lateral lean       Functional mobility during ADLs: Moderate assistance General ADL Comments: min A for bed mobility. mod A for ADLs for  cues and safety. impiared cognition and lethargy limited session     Vision Baseline Vision/History: 0 No visual deficits Vision Assessment?: No apparent  visual deficits Additional Comments: difficult to assess, pt helf eyes closed 50% of the session     Perception Perception Perception Tested?: No   Praxis Praxis Praxis tested?: Not tested    Pertinent Vitals/Pain Pain Assessment Pain Assessment: Faces Faces Pain Scale: No hurt Pain Intervention(s): Monitored during session     Hand Dominance Right   Extremity/Trunk Assessment Upper Extremity Assessment Upper Extremity Assessment: Difficult to assess due to impaired cognition   Lower Extremity Assessment Lower Extremity Assessment: Difficult to assess due to impaired cognition   Cervical / Trunk Assessment Cervical / Trunk Assessment: Normal   Communication Communication Communication: Expressive difficulties (mumbling)   Cognition Arousal/Alertness: Lethargic Behavior During Therapy: Restless Overall Cognitive Status: Difficult to assess                                 General Comments: initially reporting she walks with cane, then stated her husband uses AD, not her. Agreeable to sit EOB to groom, then became mildly agitated wanting to comb her hair. Required constant cues and redirection     General Comments  VSS on RA. HR in 100s at rest    Exercises     Shoulder Instructions      Home Living Family/patient expects to be discharged to:: Private residence Living Arrangements: Children Available Help at Discharge: Available PRN/intermittently Type of Home: House Home Access: Stairs to enter Secretary/administrator of Steps: 3-5   Home Layout: One level     Bathroom Shower/Tub: Tub/shower unit         Home Equipment: Cane - single point          Prior Functioning/Environment Prior Level of Function : Patient poor historian/Family not available;Needs assist             Mobility Comments: walks with cane ADLs Comments: reports bathing and dressing on her own        OT Problem List: Decreased strength;Decreased range of  motion;Impaired balance (sitting and/or standing);Decreased activity tolerance;Decreased cognition;Decreased safety awareness;Decreased knowledge of use of DME or AE;Decreased knowledge of precautions      OT Treatment/Interventions: Self-care/ADL training;Therapeutic exercise;DME and/or AE instruction;Therapeutic activities;Patient/family education;Balance training    OT Goals(Current goals can be found in the care plan section) Acute Rehab OT Goals Patient Stated Goal: to sleep OT Goal Formulation: With patient Time For Goal Achievement: 04/06/22 Potential to Achieve Goals: Good ADL Goals Pt Will Perform Grooming: with modified independence;standing Pt Will Perform Upper Body Dressing: with set-up;sitting Pt Will Perform Lower Body Dressing: with set-up;sit to/from stand Pt Will Transfer to Toilet: with supervision;ambulating Additional ADL Goal #1: Pt will indep follow 2 step commands 100% of the session  OT Frequency: Min 2X/week    Co-evaluation              AM-PAC OT "6 Clicks" Daily Activity     Outcome Measure Help from another person eating meals?: None Help from another person taking care of personal grooming?: A Lot Help from another person toileting, which includes using toliet, bedpan, or urinal?: A Lot Help from another person bathing (including washing, rinsing, drying)?: A Lot Help from another person to put on and taking off regular upper body clothing?: A Lot Help from another person to put on and taking off regular lower  body clothing?: A Lot 6 Click Score: 14   End of Session Equipment Utilized During Treatment: Rolling walker (2 wheels) Nurse Communication: Mobility status  Activity Tolerance: Patient tolerated treatment well Patient left: in bed;with call bell/phone within reach;with bed alarm set;with restraints reapplied  OT Visit Diagnosis: Unsteadiness on feet (R26.81);Other abnormalities of gait and mobility (R26.89);Repeated falls (R29.6);Muscle  weakness (generalized) (M62.81);History of falling (Z91.81)                Time: 3664-4034 OT Time Calculation (min): 13 min Charges:  OT General Charges $OT Visit: 1 Visit OT Evaluation $OT Eval Moderate Complexity: 1 Mod    Delano Frate D Causey 03/23/2022, 5:00 PM

## 2022-03-23 NOTE — Plan of Care (Signed)

## 2022-03-23 NOTE — Evaluation (Signed)
Physical Therapy Evaluation Patient Details Name: Amanda [name] MRN: [id number] DOB: December 21, 1960 Today's Date: 03/23/2022  History of Present Illness  61 yo female presented to ED 12/4 with HTN d/c'd and advised to take her BP medication, returned to ED 12/5 with worsening SoB and hypertensive urgency significant orthopnea. CXR showed central vascular congestion and interstitial pulmonary edema. Pt admitted for treatment. Reported cocaine use about 1-2 weeks ago. PMH:HTN, polysubstance use (occasional cocaine, tobacco, marijuana, alcohol), CKD, and CHF.   Clinical Impression   Per RN pt sleeps during the day and is up all night. Attempted to perform Evaluation with limited success in collecting Home set up and PLOF. Pt easily internally distracted during conversation. Pt declined to come to EoB despite maximal encouragement, citing severe headache. Pt noted to roll onto both sides during interview without assist. PT will follow back for more extensive Evaluation. Given level of bed mobility likely pt may be able to go home with some assistance.        Recommendations for follow up therapy are one component of a multi-disciplinary discharge planning process, led by the attending physician.  Recommendations may be updated based on patient status, additional functional criteria and insurance authorization.  Follow Up Recommendations Home health PT      Assistance Recommended at Discharge Frequent or constant Supervision/Assistance  Patient can return home with the following  A little help with walking and/or transfers;A little help with bathing/dressing/bathroom;Assistance with cooking/housework;Direct supervision/assist for medications management;Assist for transportation;Help with stairs or ramp for entrance    Equipment Recommendations Rolling walker (2 wheels)      Functional Status Assessment Patient has had a recent decline in their functional status and demonstrates the ability to make  significant improvements in function in a reasonable and predictable amount of time.     Precautions / Restrictions Precautions Precautions: Fall Precaution Comments: telesitter, posey belt in bed and fall mats in place Restrictions Weight Bearing Restrictions: No           Pertinent Vitals/Pain Pain Assessment Pain Assessment: 0-10 Pain Score: 10-Worst pain ever Pain Location: headache Pain Descriptors / Indicators: Headache Pain Intervention(s): Limited activity within patient's tolerance, Monitored during session    Home Living Family/patient expects to be discharged to:: Private residence Living Arrangements: Children Available Help at Discharge: Available PRN/intermittently Type of Home: House Home Access: Stairs to enter   Secretary/administrator of Steps: 3-5   Home Layout: One level Home Equipment: Cane - single point      Prior Function Prior Level of Function : Patient poor historian/Family not available;Needs assist             Mobility Comments: walks with cane ADLs Comments: reports bathing and dressing on her own         Extremity/Trunk Assessment   Upper Extremity Assessment Upper Extremity Assessment: Difficult to assess due to impaired cognition (pt appears to have full ROM moving in bed)    Lower Extremity Assessment Lower Extremity Assessment: Difficult to assess due to impaired cognition (pt appears to have full ROM moving in bed)       Communication   Communication:  (very lethargic)  Cognition Arousal/Alertness: Lethargic Behavior During Therapy: Restless Overall Cognitive Status: Difficult to assess                                 General Comments: pt requires increased stimulation to rouse, able to answer limited questions  about her home set up and PLOF, 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        General Comments General comments (skin integrity, edema, etc.): HR in 110s  at rest,         Assessment/Plan    PT Assessment Patient needs continued PT services  PT Problem List Decreased strength;Decreased activity tolerance;Decreased cognition       PT Treatment Interventions DME instruction;Gait training;Stair training;Functional mobility training;Therapeutic activities;Therapeutic exercise;Balance training;Cognitive remediation;Patient/family education    PT Goals (Current goals can be found in the Care Plan section)  Acute Rehab PT Goals Patient Stated Goal: go to [facility] PT Goal Formulation: Patient unable to participate in goal setting Time For Goal Achievement: 04/06/22 Potential to Achieve Goals: Fair    Frequency Min 3X/week         AM-PAC PT "6 Clicks" Mobility  Outcome Measure Help needed turning from your back to your side while in a flat bed without using bedrails?: None Help needed moving from lying on your back to sitting on the side of a flat bed without using bedrails?: Total Help needed moving to and from a bed to a chair (including a wheelchair)?: Total Help needed standing up from a chair using your arms (e.g., wheelchair or bedside chair)?: Total Help needed to walk in hospital room?: Total Help needed climbing 3-5 steps with a railing? : Total 6 Click Score: 9    End of Session   Activity Tolerance: Patient limited by lethargy Patient left: in bed;with call bell/phone within reach;Other (comment) (posey belt and telesitter) Nurse Communication: Mobility status;Other (comment) (decreased participation) PT Visit Diagnosis: Muscle weakness (generalized) (M62.81)    Time: 8756-4332 PT Time Calculation (min) (ACUTE ONLY): 28 min   Charges:   PT Evaluation $PT Eval Moderate Complexity: 1 Mod          [name] PT, DPT Acute Rehabilitation Services Please use secure chat or  Call Office [phone]   Elon Alas [facility] 03/23/2022, 12:18 PM

## 2022-03-24 LAB — BASIC METABOLIC PANEL
Anion gap: 12 (ref 5–15)
BUN: 23 mg/dL — ABNORMAL HIGH (ref 6–20)
CO2: 23 mmol/L (ref 22–32)
Calcium: 9.3 mg/dL (ref 8.9–10.3)
Chloride: 102 mmol/L (ref 98–111)
Creatinine, Ser: 1.28 mg/dL — ABNORMAL HIGH (ref 0.44–1.00)
GFR, Estimated: 48 mL/min — ABNORMAL LOW (ref >60)
Glucose, Bld: 142 mg/dL — ABNORMAL HIGH (ref 70–99)
Potassium: 3.8 mmol/L (ref 3.5–5.1)
Sodium: 137 mmol/L (ref 135–145)

## 2022-03-24 LAB — GLUCOSE, CAPILLARY
Glucose-Capillary: 99 mg/dL (ref 70–99)
Glucose-Capillary: 123 mg/dL — ABNORMAL HIGH (ref 70–99)
Glucose-Capillary: 163 mg/dL — ABNORMAL HIGH (ref 70–99)
Glucose-Capillary: 110 mg/dL — ABNORMAL HIGH (ref 70–99)

## 2022-03-24 MED ORDER — STERILE WATER FOR INJECTION IJ SOLN
INTRAMUSCULAR | Status: AC
  Administered 2022-03-24: 2.1 mL
  Filled 2022-03-24: qty 10

## 2022-03-24 MED ORDER — LORAZEPAM 2 MG/ML IJ SOLN
1.0000 mg | INTRAMUSCULAR | Status: DC | PRN
  Administered 2022-03-24 – 2022-03-25 (×2): 1 mg via INTRAVENOUS
  Filled 2022-03-24 (×2): qty 1

## 2022-03-24 MED ORDER — OLANZAPINE 10 MG IM SOLR
10.0000 mg | Freq: Once | INTRAMUSCULAR | Status: AC
  Administered 2022-03-24: 10 mg via INTRAMUSCULAR
  Filled 2022-03-24: qty 10

## 2022-03-24 MED ORDER — QUETIAPINE FUMARATE 25 MG PO TABS
25.0000 mg | ORAL_TABLET | Freq: Two times a day (BID) | ORAL | Status: DC
  Administered 2022-03-24 – 2022-03-26 (×5): 25 mg via ORAL
  Filled 2022-03-24 (×5): qty 1

## 2022-03-24 NOTE — Progress Notes (Signed)
Pt becoming increasingly agitated, attempting to get OOB, and threatening to hit staff.  She has been trying to call her husband, Amanda [name], and is getting upset that he isn't answering the phone.  Dr. Imogene Burn notified.  Security notified.  Pt calmed down and laid back in bed when security arrived.  She agrees to stay in bed and not threaten staff anymore.  Will continue to monitor.  Alonza Bogus

## 2022-03-24 NOTE — Progress Notes (Signed)
PROGRESS NOTE    Amanda [name]  KGU:[id number] DOB: [birth year] DOA: 03/17/2022 PCP: Patient, No Pcp Per  Amanda [name] is a 61/F with history of chronic diastolic CHF, CKD 3a, hypertension, cocaine and alcohol abuse, noncompliance, presented to the ED with 2 weeks of chest discomfort, shortness of breath, cough and intermittent chest pain, last used cocaine about a week ago.  Has not been taking her medications consistently, does not have a PCP or cardiologist.  In the ED BP elevated, labs with BNP 1526, chest x-ray with pulmonary vascular congestion, mild interstitial edema, creatinine 1.12, troponin 53 and 54  -12/7 evening onwards, significant withdrawal symptoms, started on CIWA  Subjective: -Some agitation overnight, slept most of the day yesterday, overnight woke up was restless, tried to call her boyfriend 3 times -Given Zyprexa earlier this morning  Assessment and Plan:  Acute on chronic HFpEF  -Long history of noncompliance, recurrent admissions with hypertensive urgency, pulmonary edema and cocaine use -Last echo 5/23 with EF of 55-60%, mild LVH, grade 2 diastolic dysfunction -Diuresed briefly with IV Lasix, now euvolemic, p.o. intake is poor with lethargy and withdrawal, IV Lasix on hold -Continue Jardiance, Aldactone -TOC follow-up arranged  Alcohol withdrawal Now with delirium -I suspect alcohol withdrawal part has mostly resolved, now a component of delirium is also suspected, add twice daily Seroquel temporarily -Continue thiamine -Increase activity, PT OT   Hypertensive crisis   - BP as high as 213/139 in ED, worsened in the setting of cocaine -on Norvasc, hydralazine, Imdur,  off nitro gtt. -BP improving, still somewhat suboptimal, continue current meds, nitro dose increased   CKD IIIa  -Stable    Elevated troponin  - Troponin 53 and then 54 in ED, not a pattern suggestive of ACS but likely d/t severe HTN and acute CHF    Cocaine abuse - Reports last use was 1-2  weeks ago    - Counseled  Alcohol abuse -Counsel again when mental status is improved   SDOH -Poor health literacy, social support, polysubstance abuse, including cocaine and alcohol, noncompliance, TOC follow-up scheduled     DVT prophylaxis: Lovenox  Code Status: Full  Family Communication: None present Disposition Plan: Home in 1 to 2 days  Consultants:    Procedures:   Antimicrobials:    Objective: Vitals:   03/23/22 2029 03/24/22 0009 03/24/22 0417 03/24/22 0808  BP: (!) 150/97 (!) 135/95 (!) 153/104 (!) 145/114  Pulse: 89 95 96 94  Resp: 20 18 16 16   Temp: 97.6 F (36.4 C) 98.2 F (36.8 C) 97.7 F (36.5 C) 97.7 F (36.5 C)  TempSrc: Axillary Oral Axillary Axillary  SpO2: 91% 93% 97% 98%  Weight:   57.8 kg   Height:        Intake/Output Summary (Last 24 hours) at 03/24/2022 1212 Last data filed at 03/24/2022 0321 Gross per 24 hour  Intake 720 ml  Output 500 ml  Net 220 ml   Filed Weights   03/22/22 0350 03/23/22 0442 03/24/22 0417  Weight: 57.4 kg 57.4 kg 57.8 kg    Examination:  General exam: Somnolent but arousable, oriented to self and partly to place only, cognitive deficits HEENT: S1-S2, regular rhythm, tachycardic Lungs: Improving air movement, decreased breath sounds at the bases Abdomen: Soft, nontender, bowel sounds present Extremities: No edema Neuro: Somnolent but easily arousable, follows commands, no localizing signs Skin: No rashes Psychiatry: Unable to assess    Data Reviewed:   CBC: Recent Labs  Lab 03/18/22 0318 03/19/22 0301  03/21/22 0137 03/22/22 0142 03/23/22 0251  WBC 7.2 8.9 7.4 6.6 5.5  HGB 15.5* 14.1 15.4* 16.0* 17.6*  HCT 45.6 42.3 44.9 48.6* 51.4*  MCV 92.1 91.0 90.0 91.7 89.7  PLT 279 258 299 330 331   Basic Metabolic Panel: Recent Labs  Lab 03/18/22 0318 03/19/22 0301 03/20/22 0636 03/21/22 0137 03/22/22 0142 03/23/22 0251 03/24/22 0144  NA 141   < > 139 142 140 137 137  K 3.7   < > 3.9 4.3  4.0 3.9 3.8  CL 106   < > 100 106 106 102 102  CO2 22   < > 26 25 20* 21* 23  GLUCOSE 124*   < > 109* 100* 124* 139* 142*  BUN 21*   < > 16 18 21* 21* 23*  CREATININE 1.20*   < > 1.01* 1.25* 1.21* 1.27* 1.28*  CALCIUM 8.9   < > 9.2 9.3 9.2 9.0 9.3  MG 1.6*  --  2.1  --   --   --   --    < > = values in this interval not displayed.   GFR: Estimated Creatinine Clearance: 40.4 mL/min (A) (by C-G formula based on SCr of 1.28 mg/dL (H)). Liver Function Tests: Recent Labs  Lab 03/18/22 2210 03/21/22 0137 03/22/22 0142 03/23/22 0251  AST 73* 30 26 27   ALT 103* 53* 42 36  ALKPHOS 183* 119 117 123  BILITOT 0.7 1.2 1.2 0.7  PROT 6.3* 6.6 6.9 7.8  ALBUMIN 2.8* 2.7* 2.8* 3.1*   No results for input(s): "LIPASE", "AMYLASE" in the last 168 hours.  Recent Labs  Lab 03/23/22 0251  AMMONIA 35   Coagulation Profile: No results for input(s): "INR", "PROTIME" in the last 168 hours. Cardiac Enzymes: No results for input(s): "CKTOTAL", "CKMB", "CKMBINDEX", "TROPONINI" in the last 168 hours. BNP (last 3 results) No results for input(s): "PROBNP" in the last 8760 hours. HbA1C: Recent Labs    03/22/22 0142  HGBA1C 6.9*   CBG: Recent Labs  Lab 03/23/22 0759 03/23/22 1228 03/23/22 1559 03/23/22 2142 03/24/22 0807  GLUCAP 101* 128* 106* 227* 99   Lipid Profile: No results for input(s): "CHOL", "HDL", "LDLCALC", "TRIG", "CHOLHDL", "LDLDIRECT" in the last 72 hours. Thyroid Function Tests: No results for input(s): "TSH", "T4TOTAL", "FREET4", "T3FREE", "THYROIDAB" in the last 72 hours. Anemia Panel: No results for input(s): "VITAMINB12", "FOLATE", "FERRITIN", "TIBC", "IRON", "RETICCTPCT" in the last 72 hours. Urine analysis:    Component Value Date/Time   COLORURINE STRAW (A) 09/09/2021 0015   APPEARANCEUR CLEAR 09/09/2021 0015   LABSPEC 1.008 09/09/2021 0015   PHURINE 6.0 09/09/2021 0015   GLUCOSEU NEGATIVE 09/09/2021 0015   HGBUR SMALL (A) 09/09/2021 0015   BILIRUBINUR  NEGATIVE 09/09/2021 0015   BILIRUBINUR Small 10/11/2013 1200   KETONESUR NEGATIVE 09/09/2021 0015   PROTEINUR NEGATIVE 09/09/2021 0015   UROBILINOGEN 0.2 10/11/2013 1200   UROBILINOGEN 0.2 02/28/2008 1612   NITRITE NEGATIVE 09/09/2021 0015   LEUKOCYTESUR NEGATIVE 09/09/2021 0015   Sepsis Labs: @LABRCNTIP (procalcitonin:4,lacticidven:4)  )No results found for this or any previous visit (from the past 240 hour(s)).   Radiology Studies: No results found.   Scheduled Meds:  diltiazem  120 mg Oral Q12H   empagliflozin  10 mg Oral Daily   enoxaparin (LOVENOX) injection  40 mg Subcutaneous Q24H   folic acid  1 mg Oral Daily   hydrALAZINE  50 mg Oral TID   insulin aspart  0-9 Units Subcutaneous TID WC   isosorbide mononitrate  60  mg Oral Daily   multivitamin with minerals  1 tablet Oral Daily   QUEtiapine  25 mg Oral BID   sodium chloride flush  3 mL Intravenous Q12H   spironolactone  25 mg Oral Daily   thiamine  100 mg Oral Daily   Or   thiamine  100 mg Intravenous Daily   Continuous Infusions:   LOS: 6 days    Time spent:  Zannie Cove, MD [facility]   03/24/2022, 12:12 PM

## 2022-03-24 NOTE — Progress Notes (Signed)
Physical Therapy Treatment Patient Details Name: Amanda [name] MRN: [id number] DOB: 21-Dec-1960 Today's Date: 03/24/2022   History of Present Illness 61 yo female presented to ED 12/4 with HTN d/c'd and advised to take her BP medication, returned to ED 12/5 with worsening SoB and hypertensive urgency significant orthopnea. CXR showed central vascular congestion and interstitial pulmonary edema. Pt admitted for treatment. Reported cocaine use about 1-2 weeks ago. PMH:HTN, polysubstance use (occasional cocaine, tobacco, marijuana, alcohol), CKD, and CHF.    PT Comments    Pt continues to have limited awareness of situation and need for medication administration and disentanglement and replacing lines and leads. Pt physically able to perform bed mobility at a supervision level spontaneously however refuses to follow purposeful commands for her own care and safety. D/c plans likely appropriate as pt will do better in her own environment and schedule (prefers up at night and sleep through the day), however yet to see pt's out of bed mobility.  PT will continue to follow acutely and make further recommendations as needed.   Recommendations for follow up therapy are one component of a multi-disciplinary discharge planning process, led by the attending physician.  Recommendations may be updated based on patient status, additional functional criteria and insurance authorization.  Follow Up Recommendations  Home health PT     Assistance Recommended at Discharge Frequent or constant Supervision/Assistance  Patient can return home with the following A little help with walking and/or transfers;A little help with bathing/dressing/bathroom;Assistance with cooking/housework;Direct supervision/assist for medications management;Assist for transportation;Help with stairs or ramp for entrance   Equipment Recommendations  Rolling walker (2 wheels)       Precautions / Restrictions Precautions Precautions:  Fall Precaution Comments: telesitter, posey belt in bed and fall mats in place Restrictions Weight Bearing Restrictions: No     Mobility  Bed Mobility Overal bed mobility: Needs Assistance Bed Mobility: Rolling Rolling: Independent   Supine to sit: Supervision Sit to supine: Supervision   General bed mobility comments: pt does not take direction but is able to come to longsitting in bed without assist, and for scooting up in bed with use of LE to bridge, however requires total Ax2 to lift to position in bed for replacement of faulty posey belt in bed. With increased effort and coaxing staff able to replace posey belt, disentangle from lines and leads and have pt swallow medication before pt rolled over and went back to sleep               Cognition Arousal/Alertness: Lethargic Behavior During Therapy: Restless, Impulsive Overall Cognitive Status: Impaired/Different from baseline Area of Impairment: Orientation, Following commands, Safety/judgement, Awareness, Problem solving, Attention                 Orientation Level: Disoriented to, Place, Time, Situation Current Attention Level: Selective   Following Commands: Follows one step commands inconsistently Safety/Judgement: Decreased awareness of safety Awareness: Intellectual Problem Solving: Slow processing, Decreased initiation, Difficulty sequencing, Requires verbal cues, Requires tactile cues General Comments: RN in room trying to adminster medication however pt with decreased command follow and   frustration with Posey belt in place, decreased reasoning with respect to repositioning and dientanglement from lines and lead, as well as with taking medication, pt only oriented to self,           General Comments  HR in 100-110s with bed mobility, on RA       Pertinent Vitals/Pain Pain Assessment Pain Assessment: Faces Faces Pain Scale: No hurt  PT Goals (current goals can now be found in the care plan  section) Acute Rehab PT Goals Patient Stated Goal: go to [facility] PT Goal Formulation: Patient unable to participate in goal setting Time For Goal Achievement: 04/06/22 Potential to Achieve Goals: Fair Progress towards PT goals: Not progressing toward goals - comment    Frequency    Min 3X/week      PT Plan Current plan remains appropriate       AM-PAC PT "6 Clicks" Mobility   Outcome Measure  Help needed turning from your back to your side while in a flat bed without using bedrails?: None Help needed moving from lying on your back to sitting on the side of a flat bed without using bedrails?: Total Help needed moving to and from a bed to a chair (including a wheelchair)?: Total Help needed standing up from a chair using your arms (e.g., wheelchair or bedside chair)?: Total Help needed to walk in hospital room?: Total Help needed climbing 3-5 steps with a railing? : Total 6 Click Score: 9    End of Session   Activity Tolerance: Patient limited by lethargy Patient left: in bed;with call bell/phone within reach;Other (comment) (posey belt and telesitter) Nurse Communication: Mobility status;Other (comment) (decreased participation) PT Visit Diagnosis: Muscle weakness (generalized) (M62.81)     Time: 4193-7902 PT Time Calculation (min) (ACUTE ONLY): 20 min  Charges:  $Therapeutic Activity: 8-22 mins                     Baltazar Pekala B. Beverely Risen PT, DPT Acute Rehabilitation Services Please use secure chat or  Call Office [phone]    Elon Alas [facility] 03/24/2022, 9:08 AM

## 2022-03-24 NOTE — Progress Notes (Signed)
   Heart Failure Stewardship Pharmacist Progress Note   PCP: Patient, No Pcp Per PCP-Cardiologist: None    HPI:  61 yo AAF with PMH significant for HTN, polysubstance use (occasional cocaine, tobacco, marijuana, alcohol), CKD, and CHF.  Hospitalized in May with acute heart failure and hypertensive urgency. She reported medication non-adherence for years due to cost. UDS was positive for cocaine. Her Echo on 05/30 revealed LVEF 55-60% with mild LVH, G2DD, normal RV function, severe biatrial dilation, and mild MR. Diuresed, started on GDMT, and discharged. Did not show for HF TOC follow up.  She presented to the ED on 12/4 with hypertension. Discharged and advised to take her BP medications.  Back in the ED on 12/5 with worsening shortness of breath, hypertensive urgency, and significant orthopnea. CXR showed central vascular congestion and interstitial pulmonary edema. Reported cocaine use about 1-2 weeks PTA. No UDS collected on admission.  Current HF Medications: MRA: spironolactone 25 mg daily SGLT2i: Jardiance 10 mg daily Other: hydralazine 50 mg TID + Imdur 60 mg daily  Prior to admission HF Medications: None - not taking any meds (Jardiance, losartan, amlodipine)  Pertinent Lab Values: Serum creatinine 1.28, BUN 23, Potassium 3.8, Sodium 137, BNP 1525.6, Magnesium 2.1, A1c 6.8   Vital Signs: Weight: 127 lbs (admission weight: 135 lbs) Blood pressure: 150/100s  Heart rate: 90s  I/O: +0.1L yesterday; net -4.0L  Medication Assistance / Insurance Benefits Check: Does the patient have prescription insurance?  Yes Type of insurance plan: [place] Medicaid  Outpatient Pharmacy:  Prior to [facility] Is the patient willing to use [facility] TOC pharmacy at discharge? Yes Is the patient willing to transition their outpatient pharmacy to utilize a [facility] outpatient pharmacy?   Pending     Assessment: 1. Acute on chronic diastolic CHF (LVEF 55-60%), due to  presumed NICM. No ischemic evaluation done. NYHA class III symptoms. - Off IV lasix. Strict I/Os and daily weights. Keep K>4 and Mag>2.  - Creatinine stable today, baseline ~1.2. Continue spironolactone 25 mg daily. Consider adding ARB tomorrow if BP remains elevated. - Continue Jardiance 10 mg daily - Continue hydralazine 50 mg TID + Imdur 60 mg daily, BP is improving  Plan: 1) Medication changes recommended at this time: - Potentially add ARB tomorrow   2) Patient assistance: - Prior authorization required for Jardiance, approved - Entresto also requires prior authorization, will complete if added  3)  Education  - Patient has been educated on current HF medications and potential additions to HF medication regimen - Patient verbalizes understanding that over the next few months, these medication doses may change and more medications may be added to optimize HF regimen - Patient has been educated on basic disease state pathophysiology and goals of therapy   Sharen Hones, PharmD, BCPS Heart Failure Engineer, building services Phone [phone]

## 2022-03-24 NOTE — Plan of Care (Signed)

## 2022-03-25 LAB — GLUCOSE, CAPILLARY
Glucose-Capillary: 91 mg/dL (ref 70–99)
Glucose-Capillary: 139 mg/dL — ABNORMAL HIGH (ref 70–99)
Glucose-Capillary: 183 mg/dL — ABNORMAL HIGH (ref 70–99)
Glucose-Capillary: 152 mg/dL — ABNORMAL HIGH (ref 70–99)

## 2022-03-25 LAB — CREATININE, SERUM
Creatinine, Ser: 1.34 mg/dL — ABNORMAL HIGH (ref 0.44–1.00)
GFR, Estimated: 45 mL/min — ABNORMAL LOW (ref >60)

## 2022-03-25 MED ORDER — LORAZEPAM 2 MG/ML IJ SOLN
1.0000 mg | INTRAMUSCULAR | Status: DC | PRN
  Administered 2022-03-25: 1 mg via INTRAVENOUS
  Filled 2022-03-25: qty 1

## 2022-03-25 MED ORDER — LORAZEPAM 2 MG/ML IJ SOLN: 2.0000 mg | INTRAMUSCULAR | Status: DC | PRN

## 2022-03-25 MED ORDER — BENZONATATE 100 MG PO CAPS
100.0000 mg | ORAL_CAPSULE | Freq: Three times a day (TID) | ORAL | Status: DC | PRN
  Administered 2022-03-25: 100 mg via ORAL
  Filled 2022-03-25: qty 1

## 2022-03-25 MED ORDER — DILTIAZEM HCL ER COATED BEADS 240 MG PO CP24
240.0000 mg | ORAL_CAPSULE | Freq: Every day | ORAL | Status: DC
  Administered 2022-03-25 – 2022-03-26 (×2): 240 mg via ORAL
  Filled 2022-03-25 (×2): qty 1

## 2022-03-25 MED ORDER — HYDRALAZINE HCL 50 MG PO TABS
75.0000 mg | ORAL_TABLET | Freq: Three times a day (TID) | ORAL | Status: DC
  Administered 2022-03-25 – 2022-03-26 (×4): 75 mg via ORAL
  Filled 2022-03-25 (×4): qty 1

## 2022-03-25 MED ORDER — HALOPERIDOL LACTATE 5 MG/ML IJ SOLN
1.0000 mg | Freq: Once | INTRAMUSCULAR | Status: AC
  Administered 2022-03-25: 1 mg via INTRAVENOUS
  Filled 2022-03-25: qty 1

## 2022-03-25 NOTE — TOC Initial Note (Signed)
Transition of Care [facility]) - Initial/Assessment Note    Patient Details  Name: Amanda [name] MRN: [id number] Date of Birth: 1961/03/21  Transition of Care [facility]) CM/SW Contact:    Gala Lewandowsky, RN Phone Number: 03/25/2022, 2:59 PM  Clinical Narrative: Patient presented for chest pain and shortness of breath. PT/OT evaluations recommended home health Physical Therapy/Occupational Therapy. Case Manager will continue to follow the patient as she progresses for disposition needs.                  Expected Discharge Plan: Home w Home Health Services Barriers to Discharge: Continued Medical Work up  Expected Discharge Plan and Services Expected Discharge Plan: Home w Home Health Services In-house Referral: Clinical Social Work Discharge Planning Services: CM Consult Post Acute Care Choice: Home Health Living arrangements for the past 2 months: Single Family Home  Prior Living Arrangements/Services Living arrangements for the past 2 months: Single Family Home   Patient language and need for interpreter reviewed:: Yes     Criminal Activity/Legal Involvement Pertinent to Current Situation/Hospitalization: No - Comment as needed  Activities of Daily Living Home Assistive Devices/Equipment: None ADL Screening (condition at time of admission) Patient's cognitive ability adequate to safely complete daily activities?: Yes Is the patient deaf or have difficulty hearing?: No Does the patient have difficulty seeing, even when wearing glasses/contacts?: No Does the patient have difficulty concentrating, remembering, or making decisions?: Yes Patient able to express need for assistance with ADLs?: Yes Does the patient have difficulty dressing or bathing?: Yes Independently performs ADLs?: Yes (appropriate for developmental age) Does the patient have difficulty walking or climbing stairs?: No Weakness of Legs: None Weakness of Arms/Hands: None  Admission diagnosis:  Hypertensive  emergency [I16.1] Acute on chronic diastolic CHF (congestive heart failure) (HCC) [I50.33] Acute on chronic congestive heart failure, unspecified heart failure type [facility]) [I50.9] Patient Active Problem List   Diagnosis Date Noted   Acute on chronic diastolic CHF (congestive heart failure) (HCC) 03/18/2022   Stage 3a chronic kidney disease (CKD) (HCC) 03/18/2022   Hypertensive emergency 09/08/2021   Elevated troponin 09/08/2021   Pulmonary edema 09/08/2021   Tobacco abuse 09/08/2021   Cocaine abuse (HCC) 09/08/2021   History of alcohol abuse 09/08/2021   Abnormal chest x-ray 09/08/2021   Acute respiratory distress 09/08/2021   PCP:  Patient, No Pcp Per Pharmacy:   [facility] [phone] - [place], [place] - [address] AT [facility] [address] [place] [address] Phone: [phone] Fax: [phone]  [facility] [address] [place] [address] Phone: [phone] Fax: [phone]  [facility] [address] [place] [address] Phone: [phone] Fax: [phone]   Social Determinants of Health (SDOH) Interventions Food Insecurity Interventions: Intervention Not Indicated Housing Interventions: Intervention Not Indicated Transportation Interventions: Intervention Not Indicated Alcohol Usage Interventions: Intervention Not Indicated (Score <7) Financial Strain Interventions: Intervention Not Indicated  Readmission Risk Interventions     No data to display

## 2022-03-25 NOTE — Progress Notes (Signed)
   Heart Failure Stewardship Pharmacist Progress Note   PCP: Patient, No Pcp Per PCP-Cardiologist: None    HPI:  61 yo AAF with PMH significant for HTN, polysubstance use (occasional cocaine, tobacco, marijuana, alcohol), CKD, and CHF.  Hospitalized in May with acute heart failure and hypertensive urgency. She reported medication non-adherence for years due to cost. UDS was positive for cocaine. Her Echo on 05/30 revealed LVEF 55-60% with mild LVH, G2DD, normal RV function, severe biatrial dilation, and mild MR. Diuresed, started on GDMT, and discharged. Did not show for HF TOC follow up.  She presented to the ED on 12/4 with hypertension. Discharged and advised to take her BP medications.  Back in the ED on 12/5 with worsening shortness of breath, hypertensive urgency, and significant orthopnea. CXR showed central vascular congestion and interstitial pulmonary edema. Reported cocaine use about 1-2 weeks PTA. No UDS collected on admission.  Current HF Medications: MRA: spironolactone 25 mg daily SGLT2i: Jardiance 10 mg daily Other: hydralazine 75 mg TID + Imdur 60 mg daily  Prior to admission HF Medications: None - not taking any meds (Jardiance, losartan, amlodipine)  Pertinent Lab Values: Serum creatinine 1.34, BUN 23, Potassium 3.8, Sodium 137, BNP 1525.6, Magnesium 2.1, A1c 6.8   Vital Signs: Weight: 125 lbs (admission weight: 135 lbs) Blood pressure: 130/70s  Heart rate: 110s  I/O: -0.5L yesterday; net -4.3L  Medication Assistance / Insurance Benefits Check: Does the patient have prescription insurance?  Yes Type of insurance plan: [place] Medicaid  Outpatient Pharmacy:  [facility] Is the patient willing to use [facility] TOC pharmacy at discharge? Yes Is the patient willing to transition their outpatient pharmacy to utilize a [facility] outpatient pharmacy?   Pending     Assessment: 1. Acute on chronic diastolic CHF (LVEF 55-60%), due to  presumed NICM. No ischemic evaluation done. NYHA class III symptoms. - Off IV lasix. Strict I/Os and daily weights. Keep K>4 and Mag>2.  - Creatinine relatively stable today, baseline ~1.2. Continue spironolactone 25 mg daily. - Continue Jardiance 10 mg daily - Agree with increasing to hydralazine 75 mg TID - Continue Imdur 60 mg daily  Plan: 1) Medication changes recommended at this time: - Agree with changes  2) Patient assistance: - Prior authorization required for Jardiance, approved - Entresto also requires prior authorization, will complete if added  3)  Education  - Patient has been educated on current HF medications and potential additions to HF medication regimen - Patient verbalizes understanding that over the next few months, these medication doses may change and more medications may be added to optimize HF regimen - Patient has been educated on basic disease state pathophysiology and goals of therapy   Sharen Hones, PharmD, BCPS Heart Failure Engineer, building services Phone [phone]

## 2022-03-25 NOTE — Progress Notes (Signed)
PROGRESS NOTE    RONNAH HORNE  [id number] DOB: 12-02-1960 DOA: 03/17/2022 PCP: Patient, No Pcp Per  Ms. Creasman is a 61/F with history of chronic diastolic CHF, CKD 3a, hypertension, cocaine and alcohol abuse, noncompliance, presented to the ED with 2 weeks of chest discomfort, shortness of breath, cough and intermittent chest pain, recent cocaine use.  Has not been taking her medications consistently, does not have a PCP or cardiologist.  In the ED BP 220, labs with BNP 1526, chest x-ray with pulmonary vascular congestion, mild interstitial edema, creatinine 1.12, troponin 53 and 54  -12/7 evening onwards, significant withdrawal symptoms, started on CIWA, prolonged agitation with alcohol/cocaine withdrawal -Slowly improving  Subjective: -Less restless last night, mental status improving  Assessment and Plan:  Acute on chronic HFpEF  -Long history of noncompliance, recurrent admissions with hypertensive urgency, pulmonary edema and cocaine use -Last echo 5/23 with EF of 55-60%, mild LVH, grade 2 diastolic dysfunction -Diuresed briefly with IV Lasix, now euvolemic, p.o. intake was poor with lethargy and withdrawal, further diuretics held, can be resumed at low-dose at discharge -Continue Jardiance, Aldactone -TOC follow-up arranged  Alcohol withdrawal Now with delirium -Prolonged alcohol/cocaine withdrawal complicated by delirium, now improving, more lucid this morning, started on Seroquel yesterday, will change to nightly -Ativan discontinued, no longer felt to be in withdrawal -Continue thiamine -Increase activity, PT OT, hopefully home tomorrow   Hypertensive crisis   - BP as high as 213/139 in ED, worsened in the setting of cocaine -on hydralazine, Imdur,  off nitro gtt. -BP improving, still suboptimal, increase hydralazine, continue Cardizem   CKD IIIa  -Stable    Elevated troponin  - Troponin 53 and then 54 in ED, not a pattern suggestive of ACS but likely d/t severe  HTN and acute CHF    Cocaine abuse - Counseled  Alcohol abuse -Counseled   SDOH -Poor health literacy, social support, polysubstance abuse, including cocaine and alcohol, noncompliance, TOC follow-up scheduled     DVT prophylaxis: Lovenox  Code Status: Full  Family Communication: None present Disposition Plan: Home tomorrow if stable  Consultants:    Procedures:   Antimicrobials:    Objective: Vitals:   03/25/22 0350 03/25/22 0500 03/25/22 0753 03/25/22 1153  BP: (!) 163/76  (!) 130/112 138/69  Pulse: (!) 109  (!) 105 (!) 104  Resp: 18  19 18   Temp: 98.2 F (36.8 C)  98 F (36.7 C) 98.3 F (36.8 C)  TempSrc: Oral  Oral Oral  SpO2: 97%  98% 98%  Weight:  56.7 kg    Height:        Intake/Output Summary (Last 24 hours) at 03/25/2022 1209 Last data filed at 03/25/2022 0525 Gross per 24 hour  Intake --  Output 250 ml  Net -250 ml   Filed Weights   03/23/22 0442 03/24/22 0417 03/25/22 0500  Weight: 57.4 kg 57.8 kg 56.7 kg    Examination:  General exam: Somnolent but arousable, oriented to self and partly to place only, cognitive deficits HEENT: S1-S2, regular rhythm, tachycardic Lungs: Improving air movement, decreased breath sounds at the bases Abdomen: Soft, nontender, bowel sounds present Extremities: No edema Neuro: Somnolent but easily arousable, follows commands, no localizing signs Skin: No rashes Psychiatry: Unable to assess    Data Reviewed:   CBC: Recent Labs  Lab 03/19/22 0301 03/21/22 0137 03/22/22 0142 03/23/22 0251  WBC 8.9 7.4 6.6 5.5  HGB 14.1 15.4* 16.0* 17.6*  HCT 42.3 44.9 48.6* 51.4*  MCV 91.0  90.0 91.7 89.7  PLT 258 299 330 331   Basic Metabolic Panel: Recent Labs  Lab 03/20/22 0636 03/21/22 0137 03/22/22 0142 03/23/22 0251 03/24/22 0144 03/25/22 0547  NA 139 142 140 137 137  --   K 3.9 4.3 4.0 3.9 3.8  --   CL 100 106 106 102 102  --   CO2 26 25 20* 21* 23  --   GLUCOSE 109* 100* 124* 139* 142*  --   BUN 16  18 21* 21* 23*  --   CREATININE 1.01* 1.25* 1.21* 1.27* 1.28* 1.34*  CALCIUM 9.2 9.3 9.2 9.0 9.3  --   MG 2.1  --   --   --   --   --    GFR: Estimated Creatinine Clearance: 38.6 mL/min (A) (by C-G formula based on SCr of 1.34 mg/dL (H)). Liver Function Tests: Recent Labs  Lab 03/18/22 2210 03/21/22 0137 03/22/22 0142 03/23/22 0251  AST 73* 30 26 27   ALT 103* 53* 42 36  ALKPHOS 183* 119 117 123  BILITOT 0.7 1.2 1.2 0.7  PROT 6.3* 6.6 6.9 7.8  ALBUMIN 2.8* 2.7* 2.8* 3.1*   No results for input(s): "LIPASE", "AMYLASE" in the last 168 hours.  Recent Labs  Lab 03/23/22 0251  AMMONIA 35   Coagulation Profile: No results for input(s): "INR", "PROTIME" in the last 168 hours. Cardiac Enzymes: No results for input(s): "CKTOTAL", "CKMB", "CKMBINDEX", "TROPONINI" in the last 168 hours. BNP (last 3 results) No results for input(s): "PROBNP" in the last 8760 hours. HbA1C: No results for input(s): "HGBA1C" in the last 72 hours.  CBG: Recent Labs  Lab 03/24/22 1213 03/24/22 1653 03/24/22 2132 03/25/22 0752 03/25/22 1152  GLUCAP 123* 163* 110* 91 139*   Lipid Profile: No results for input(s): "CHOL", "HDL", "LDLCALC", "TRIG", "CHOLHDL", "LDLDIRECT" in the last 72 hours. Thyroid Function Tests: No results for input(s): "TSH", "T4TOTAL", "FREET4", "T3FREE", "THYROIDAB" in the last 72 hours. Anemia Panel: No results for input(s): "VITAMINB12", "FOLATE", "FERRITIN", "TIBC", "IRON", "RETICCTPCT" in the last 72 hours. Urine analysis:    Component Value Date/Time   COLORURINE STRAW (A) 09/09/2021 0015   APPEARANCEUR CLEAR 09/09/2021 0015   LABSPEC 1.008 09/09/2021 0015   PHURINE 6.0 09/09/2021 0015   GLUCOSEU NEGATIVE 09/09/2021 0015   HGBUR SMALL (A) 09/09/2021 0015   BILIRUBINUR NEGATIVE 09/09/2021 0015   BILIRUBINUR Small 10/11/2013 1200   KETONESUR NEGATIVE 09/09/2021 0015   PROTEINUR NEGATIVE 09/09/2021 0015   UROBILINOGEN 0.2 10/11/2013 1200   UROBILINOGEN 0.2  02/28/2008 1612   NITRITE NEGATIVE 09/09/2021 0015   LEUKOCYTESUR NEGATIVE 09/09/2021 0015   Sepsis Labs: @LABRCNTIP (procalcitonin:4,lacticidven:4)  )No results found for this or any previous visit (from the past 240 hour(s)).   Radiology Studies: No results found.   Scheduled Meds:  diltiazem  240 mg Oral Daily   empagliflozin  10 mg Oral Daily   enoxaparin (LOVENOX) injection  40 mg Subcutaneous Q24H   folic acid  1 mg Oral Daily   hydrALAZINE  75 mg Oral TID   insulin aspart  0-9 Units Subcutaneous TID WC   isosorbide mononitrate  60 mg Oral Daily   multivitamin with minerals  1 tablet Oral Daily   QUEtiapine  25 mg Oral BID   sodium chloride flush  3 mL Intravenous Q12H   spironolactone  25 mg Oral Daily   thiamine  100 mg Oral Daily   Or   thiamine  100 mg Intravenous Daily   Continuous Infusions:   LOS:  7 days    Time spent: 84min  Domenic Polite, MD [facility]   03/25/2022, 12:09 PM

## 2022-03-26 ENCOUNTER — Other Ambulatory Visit (HOSPITAL_COMMUNITY): Payer: Self-pay

## 2022-03-26 ENCOUNTER — Telehealth (HOSPITAL_COMMUNITY): Payer: Self-pay | Admitting: Pharmacy Technician

## 2022-03-26 LAB — BASIC METABOLIC PANEL
Anion gap: 11 (ref 5–15)
BUN: 23 mg/dL — ABNORMAL HIGH (ref 6–20)
CO2: 21 mmol/L — ABNORMAL LOW (ref 22–32)
Calcium: 9.4 mg/dL (ref 8.9–10.3)
Chloride: 105 mmol/L (ref 98–111)
Creatinine, Ser: 1.32 mg/dL — ABNORMAL HIGH (ref 0.44–1.00)
GFR, Estimated: 46 mL/min — ABNORMAL LOW (ref >60)
Glucose, Bld: 105 mg/dL — ABNORMAL HIGH (ref 70–99)
Potassium: 3.9 mmol/L (ref 3.5–5.1)
Sodium: 137 mmol/L (ref 135–145)

## 2022-03-26 LAB — GLUCOSE, CAPILLARY
Glucose-Capillary: 98 mg/dL (ref 70–99)
Glucose-Capillary: 99 mg/dL (ref 70–99)
Glucose-Capillary: 99 mg/dL (ref 70–99)

## 2022-03-26 MED ORDER — HYDRALAZINE HCL 25 MG PO TABS
75.0000 mg | ORAL_TABLET | Freq: Three times a day (TID) | ORAL | 0 refills | Status: DC
  Filled 2022-03-26: qty 230, 26d supply, fill #0

## 2022-03-26 MED ORDER — EMPAGLIFLOZIN 10 MG PO TABS
10.0000 mg | ORAL_TABLET | Freq: Every day | ORAL | 0 refills | Status: DC
  Filled 2022-03-26: qty 30, 30d supply, fill #0

## 2022-03-26 MED ORDER — ISOSORBIDE MONONITRATE ER 60 MG PO TB24
60.0000 mg | ORAL_TABLET | Freq: Every day | ORAL | 0 refills | Status: DC
  Filled 2022-03-26: qty 30, 30d supply, fill #0

## 2022-03-26 MED ORDER — THIAMINE HCL 100 MG PO TABS
100.0000 mg | ORAL_TABLET | Freq: Every day | ORAL | 0 refills | Status: DC
  Filled 2022-03-26: qty 30, 30d supply, fill #0

## 2022-03-26 MED ORDER — QUETIAPINE FUMARATE 25 MG PO TABS
25.0000 mg | ORAL_TABLET | Freq: Every day | ORAL | 0 refills | Status: DC
  Filled 2022-03-26: qty 14, 14d supply, fill #0

## 2022-03-26 MED ORDER — SPIRONOLACTONE 25 MG PO TABS
25.0000 mg | ORAL_TABLET | Freq: Every day | ORAL | 0 refills | Status: DC
  Filled 2022-03-26: qty 30, 30d supply, fill #0

## 2022-03-26 MED ORDER — HYDRALAZINE HCL 25 MG PO TABS
75.0000 mg | ORAL_TABLET | Freq: Three times a day (TID) | ORAL | 0 refills | Status: DC
  Filled 2022-03-26: qty 90, 10d supply, fill #0

## 2022-03-26 MED ORDER — DILTIAZEM HCL ER COATED BEADS 240 MG PO CP24
240.0000 mg | ORAL_CAPSULE | Freq: Every day | ORAL | 0 refills | Status: DC
  Filled 2022-03-26: qty 30, 30d supply, fill #0

## 2022-03-26 NOTE — Progress Notes (Signed)
   Heart Failure Stewardship Pharmacist Progress Note   PCP: Patient, No Pcp Per PCP-Cardiologist: None    HPI:  61 yo AAF with PMH significant for HTN, polysubstance use (occasional cocaine, tobacco, marijuana, alcohol), CKD, and CHF.  Hospitalized in May with acute heart failure and hypertensive urgency. She reported medication non-adherence for years due to cost. UDS was positive for cocaine. Her Echo on 05/30 revealed LVEF 55-60% with mild LVH, G2DD, normal RV function, severe biatrial dilation, and mild MR. Diuresed, started on GDMT, and discharged. Did not show for HF TOC follow up.  She presented to the ED on 12/4 with hypertension. Discharged and advised to take her BP medications.  Back in the ED on 12/5 with worsening shortness of breath, hypertensive urgency, and significant orthopnea. CXR showed central vascular congestion and interstitial pulmonary edema. Reported cocaine use about 1-2 weeks PTA. No UDS collected on admission.  Discharge HF Medications: MRA: spironolactone 25 mg daily SGLT2i: Jardiance 10 mg daily Other: hydralazine 75 mg TID + Imdur 60 mg daily  Prior to admission HF Medications: None - not taking any meds (Jardiance, losartan, amlodipine)  Pertinent Lab Values: Serum creatinine 1.32, BUN 23, Potassium 3.9, Sodium 137, BNP 1525.6, Magnesium 2.1, A1c 6.8   Vital Signs: Weight: 130 lbs (admission weight: 135 lbs) Blood pressure: 180/70s  Heart rate: 90s  I/O: -0.4L yesterday; net -4.5L  Medication Assistance / Insurance Benefits Check: Does the patient have prescription insurance?  Yes Type of insurance plan: [place] Medicaid  Outpatient Pharmacy:  Prior to admission outpatient pharmacy: [facility] Is the patient willing to use [facility] TOC pharmacy at discharge? Yes Is the patient willing to transition their outpatient pharmacy to utilize a [facility] outpatient pharmacy?   Pending     Assessment: 1. Acute on chronic diastolic CHF (LVEF 55-60%), due to  presumed NICM. No ischemic evaluation done. NYHA class III symptoms. - Off IV lasix. Strict I/Os and daily weights. Keep K>4 and Mag>2.  - Creatinine relatively stable today, baseline ~1.2. Continue spironolactone 25 mg daily. - Continue Jardiance 10 mg daily - Continue hydralazine 75 mg TID - Continue Imdur 60 mg daily  Plan: 1) Medication changes recommended at this time: - Discharge today  2) Patient assistance: - Prior authorization required for Jardiance, approved - Entresto also requires prior authorization, will complete if added  3)  Education  - Patient has been educated on current HF medications and potential additions to HF medication regimen - Patient verbalizes understanding that over the next few months, these medication doses may change and more medications may be added to optimize HF regimen - Patient has been educated on basic disease state pathophysiology and goals of therapy   Sharen Hones, PharmD, BCPS Heart Failure Engineer, building services Phone [phone]

## 2022-03-26 NOTE — Progress Notes (Signed)
CSW unable to complete consult for substance abuse resources due to patients current orientation.

## 2022-03-26 NOTE — Telephone Encounter (Signed)
Patient Advocate Encounter  Prior Authorization for QUEtiapine Fumarate 25MG  tablets  has been approved.    PA# Effective dates: 03/26/2022 through 03/26/2023      03/28/2023, CPhT Pharmacy Patient Advocate Specialist [facility] Patient Advocate Team Direct Number: [phone]  Fax: [phone]

## 2022-03-26 NOTE — Progress Notes (Signed)
Pt discharged to home. DC instructions given with female family member, Don Perking, at bedside. Discharge meds and instructions given to [facility]. Delaney Meigs verbalized understanding of instructions. All of Tamara's questions answered to her satisfaction. Pt left unit in wheelchair pushed by [place], NT. Left in stable condition.

## 2022-03-26 NOTE — Discharge Summary (Signed)
[facility]  Physician Discharge Summary   Patient ID: Amanda [name] MRN: [id number] DOB/AGE: Oct 30, 1960 61 y.o.  Admit date: 03/17/2022 Discharge date:   03/26/2022   PCP: Patient, No Pcp Per  DISCHARGE DIAGNOSES:    Acute on chronic diastolic CHF (congestive heart failure) ([place])   Hypertensive emergency   Elevated troponin   Cocaine abuse ([place])   History of alcohol abuse   Stage 3a chronic kidney disease (CKD) ([place])   RECOMMENDATIONS FOR OUTPATIENT FOLLOW UP: Outpatient follow-up with cardiology   Home Health: Outpatient PT Equipment/Devices: None  CODE STATUS: Full code  DISCHARGE CONDITION: fair  Diet recommendation: As before  INITIAL HISTORY: Amanda [name] is a 61/F with history of chronic diastolic CHF, CKD 3a, hypertension, cocaine and alcohol abuse, noncompliance, presented to the ED with 2 weeks of chest discomfort, shortness of breath, cough and intermittent chest pain, recent cocaine use.  Has not been taking her medications consistently, does not have a PCP or cardiologist.  In the ED BP 220, labs with BNP 1526, chest x-ray with pulmonary vascular congestion, mild interstitial edema, creatinine 1.12, troponin 53 and 54    HOSPITAL COURSE:   Acute on chronic HFpEF  -Long history of noncompliance, recurrent admissions with hypertensive urgency, pulmonary edema and cocaine use -Last echo 5/23 with EF of 55-60%, mild LVH, grade 2 diastolic dysfunction -Diuresed briefly with IV Lasix, now euvolemic, p.o. intake was poor with lethargy and withdrawal, further diuretics held, will be discharged on spironolactone. Will defer reinitiation of furosemide to outpatient providers.   Alcohol withdrawal/delirium -Prolonged alcohol/cocaine withdrawal complicated by delirium, now improving   Essential hypertension/initially in hypertensive crisis.  Now resolved-  BP as high as 213/139 in ED, worsened in the setting of cocaine Improved with initiation of  antihypertensives.   CKD IIIa  -Stable    Elevated troponin  - Troponin 53 and then 54 in ED, not a pattern suggestive of ACS but likely d/t severe HTN and acute CHF    Cocaine abuse - Counseled   Alcohol abuse -Counseled   Patient is stable.  Okay for discharge today.  She was agitated overnight but is more visit this morning.  Excited about going home.   PERTINENT LABS:  The results of significant diagnostics from this hospitalization (including imaging, microbiology, ancillary and laboratory) are listed below for reference.    Labs:   Basic Metabolic Panel: Recent Labs  Lab 03/20/22 0636 03/21/22 0137 03/22/22 0142 03/23/22 0251 03/24/22 0144 03/25/22 0547 03/26/22 0214  NA 139 142 140 137 137  --  137  K 3.9 4.3 4.0 3.9 3.8  --  3.9  CL 100 106 106 102 102  --  105  CO2 26 25 20* 21* 23  --  21*  GLUCOSE 109* 100* 124* 139* 142*  --  105*  BUN 16 18 21* 21* 23*  --  23*  CREATININE 1.01* 1.25* 1.21* 1.27* 1.28* 1.34* 1.32*  CALCIUM 9.2 9.3 9.2 9.0 9.3  --  9.4  MG 2.1  --   --   --   --   --   --    Liver Function Tests: Recent Labs  Lab 03/21/22 0137 03/22/22 0142 03/23/22 0251  AST 30 26 27   ALT 53* 42 36  ALKPHOS 119 117 123  BILITOT 1.2 1.2 0.7  PROT 6.6 6.9 7.8  ALBUMIN 2.7* 2.8* 3.1*    Recent Labs  Lab 03/23/22 0251  AMMONIA 35   CBC: Recent Labs  Lab  03/21/22 0137 03/22/22 0142 03/23/22 0251  WBC 7.4 6.6 5.5  HGB 15.4* 16.0* 17.6*  HCT 44.9 48.6* 51.4*  MCV 90.0 91.7 89.7  PLT 299 330 331   Cardiac Enzymes:  BNP: BNP (last 3 results) Recent Labs    09/08/21 2133 03/17/22 1940  BNP 1,230.1* 1,525.6*    CBG: Recent Labs  Lab 03/25/22 0752 03/25/22 1152 03/25/22 1556 03/25/22 2127 03/26/22 0831  GLUCAP 91 139* 183* 152* 98     IMAGING STUDIES DG CHEST PORT 1 VIEW  Result Date: 03/22/2022 CLINICAL DATA:  Hypoxia and CHF. EXAM: PORTABLE CHEST 1 VIEW COMPARISON:  01/15/2022 FINDINGS: Stable cardiac  enlargement. The no signs of pleural fluid. Mild diffuse increase interstitial markings compatible with pulmonary edema. This appears stable from the prior exam. No airspace consolidation. Visualized osseous structures are unremarkable. IMPRESSION: Unchanged cardiac enlargement and mild diffuse interstitial edema. Electronically Signed   By: Kerby Moors M.D.   On: 03/22/2022 10:19   DG Chest 1 View  Result Date: 03/17/2022 CLINICAL DATA:  Chest pain and shortness of breath. EXAM: CHEST  1 VIEW COMPARISON:  Chest x-ray from 2017 and chest CT 09/09/2021 FINDINGS: Prominent cardiopericardial silhouette, likely combination of cardiac enlargement and pericardial effusion as seen on prior chest CT. Central vascular congestion and interstitial pulmonary edema. No pleural effusions or focal infiltrates. The bony thorax is intact. IMPRESSION: 1. Cardiac enlargement. 2. CHF. Electronically Signed   By: Marijo Sanes M.D.   On: 03/17/2022 16:49   DG Chest 2 View  Result Date: 03/16/2022 CLINICAL DATA:  Chest pain EXAM: CHEST - 2 VIEW COMPARISON:  09/08/2021 FINDINGS: Unchanged cardiomegaly. Improved previously noted interstitial pulmonary edema. No new focal pulmonary opacity. No pleural effusion or pneumothorax. Previously noted trace right pleural effusion is no longer seen. No acute osseous abnormality. IMPRESSION: Unchanged cardiomegaly with improved previously noted interstitial pulmonary edema. Electronically Signed   By: Merilyn Baba M.D.   On: 03/16/2022 18:58    DISCHARGE EXAMINATION: Vitals:   03/25/22 2050 03/25/22 2055 03/26/22 0300 03/26/22 0500  BP:  (!) 143/122 (!) 180/113   Pulse: 99  94   Resp: 18  18   Temp: 98.5 F (36.9 C)  97.6 F (36.4 C)   TempSrc: Oral  Axillary   SpO2: 95%  96%   Weight:    59.3 kg  Height:        Resp: Clear to auscultation bilaterally.  Normal effort Cardio: S1-S2 is normal regular.  No S3-S4.  No rubs murmurs or bruit GI: Abdomen is soft.  Nontender  nondistended.  Bowel sounds are present normal.  No masses organomegaly Awake alert.  Slightly somnolent but easily arousable.  No obvious focal neurological deficits noted.   DISPOSITION: Home  Discharge Instructions     (HEART FAILURE PATIENTS) Call MD:  Anytime you have any of the following symptoms: 1) 3 pound weight gain in 24 hours or 5 pounds in 1 week 2) shortness of breath, with or without a dry hacking cough 3) swelling in the hands, feet or stomach 4) if you have to sleep on extra pillows at night in order to breathe.   Complete by: As directed    Ambulatory referral to Occupational Therapy   Complete by: As directed    Occupational Therapy-evaluation and treatment.   Ambulatory referral to Physical Therapy   Complete by: As directed    Outpatient physical therapy-evaluation and treatment.   Call MD for:  difficulty breathing, headache or visual disturbances  Complete by: As directed    Call MD for:  extreme fatigue   Complete by: As directed    Call MD for:  persistant dizziness or light-headedness   Complete by: As directed    Call MD for:  persistant nausea and vomiting   Complete by: As directed    Call MD for:  severe uncontrolled pain   Complete by: As directed    Call MD for:  temperature >100.4   Complete by: As directed    Diet - low sodium heart healthy   Complete by: As directed    Discharge instructions   Complete by: As directed    Please take your medications as prescribed.  Follow-up with the primary care provider within 1 week.  You were cared for by a hospitalist during your hospital stay. If you have any questions about your discharge medications or the care you received while you were in the hospital after you are discharged, you can call the unit and asked to speak with the hospitalist on call if the hospitalist that took care of you is not available. Once you are discharged, your primary care physician will handle any further medical issues. Please  note that NO REFILLS for any discharge medications will be authorized once you are discharged, as it is imperative that you return to your primary care physician (or establish a relationship with a primary care physician if you do not have one) for your aftercare needs so that they can reassess your need for medications and monitor your lab values. If you do not have a primary care physician, you can call [phone] for a physician referral.   Increase activity slowly   Complete by: As directed           Allergies as of 03/26/2022   No Known Allergies      Medication List     STOP taking these medications    amLODipine 10 MG tablet Commonly known as: NORVASC   losartan 50 MG tablet Commonly known as: Cozaar   nitroGLYCERIN 0.4 MG SL tablet Commonly known as: NITROSTAT       TAKE these medications    diltiazem 240 MG 24 hr capsule Commonly known as: CARDIZEM CD Take 1 capsule (240 mg total) by mouth daily.   empagliflozin 10 MG Tabs tablet Commonly known as: JARDIANCE Take 1 tablet (10 mg total) by mouth daily.   hydrALAZINE 25 MG tablet Commonly known as: APRESOLINE Take 3 tablets (75 mg total) by mouth 3 (three) times daily.   isosorbide mononitrate 60 MG 24 hr tablet Commonly known as: IMDUR Take 1 tablet (60 mg total) by mouth daily.   QUEtiapine 25 MG tablet Commonly known as: SEROQUEL Take 1 tablet (25 mg total) by mouth at bedtime for 14 days.   spironolactone 25 MG tablet Commonly known as: ALDACTONE Take 1 tablet (25 mg total) by mouth daily.   thiamine 100 MG tablet Commonly known as: VITAMIN B1 Take 1 tablet (100 mg total) by mouth daily.          Follow-up Information     [facility]. Go in 9 day(s).   Specialty: Cardiology Why: Hospital follow up PLEASE bring a current medication list to appointment  FREE valet parking, Entrance [address] , off [employer] information: [id number] [place] [phone]        [facility] Follow up.   Specialty:  Rehabilitation Why: Office to call with visit times- if the office has not called within 5 business days please call the office for visit. Contact information: [address] [id number] [place] [address] [phone]                TOTAL DISCHARGE TIME: 35 minutes  [place]ionna Istre Rito Ehrlich  [facility] Pager on [email or web address]  03/26/2022, 12:26 PM

## 2022-03-26 NOTE — Progress Notes (Signed)
Pt became very agitated, multiple attempts were made in order to maintain pt's safety. Attempted to call significant other that is listed in pt's chart. Pt stated that she wanted to leave and go home. This RN was able to administer nighttime medications. Pt began pulling at lines, attempting to get up out of bed, stating that she wanted to go home & leave. Pt educated on current treatment regimen & that she would most likely discharge in the morning. Pt became increasingly more aggressive in her attempt to leave. Joneen Roach, MD paged & made aware. See new orders.   Bari Edward, RN

## 2022-03-26 NOTE — Telephone Encounter (Signed)
Patient Advocate Encounter   Received notification that prior authorization for QUEtiapine Fumarate 25MG  tablets is required.   PA submitted on 03/26/2022 Key 03/28/2022 Status is pending       [id number], CPhT Pharmacy Patient Advocate Specialist [facility] Patient Advocate Team Direct Number: [phone]  Fax: [phone]

## 2022-03-26 NOTE — TOC Initial Note (Signed)
Transition of Care [facility]) - Initial/Assessment Note    Patient Details  Name: Amanda [name] MRN: [id number] Date of Birth: 09/16/1960  Transition [facility]) CM/SW Contact:    Amanda Lewandowsky, RN Phone Number: 03/26/2022, 10:53 AM  Clinical Narrative:  Risk for readmission assessment completed. PTA patient was from home with friend Amanda [name]. Patient has a hx of substance abuse. Recommendations are for home health PT/OT. Agencies unable to accept at this time due to substance use. Case Manager was able to arrange outpatient PT/OT for this patient at the [facility] location. Office to call the patient with visit times. No further needs identified at this time.                 Expected Discharge Plan: Home/Self Care (outpatient PT/OT) Barriers to Discharge: No Barriers Identified   Patient Goals and CMS Choice     Choice offered to / list presented to : NA  Expected Discharge Plan and Services Expected Discharge Plan: Home/Self Care (outpatient PT/OT) In-house Referral: Clinical Social Work Discharge Planning Services: CM Consult Post Acute Care Choice: Home Health Living arrangements for the past 2 months: Single Family Home Expected Discharge Date: 03/26/22               DME Arranged: N/A DME Agency: NA       HH Arranged: NA (arranged outaptient PT.)          Prior Living Arrangements/Services Living arrangements for the past 2 months: Single Family Home Lives with:: Friends Patient language and need for interpreter reviewed:: Yes Do you feel safe going back to the place where you live?: Yes      Need for Family Participation in Patient Care: Yes (Comment) Care giver support system in place?: Yes (comment) Current home services: DME (cane) Criminal Activity/Legal Involvement Pertinent to Current Situation/Hospitalization: No - Comment as needed  Activities of Daily Living Home Assistive Devices/Equipment: None ADL Screening (condition at time of  admission) Patient's cognitive ability adequate to safely complete daily activities?: Yes Is the patient deaf or have difficulty hearing?: No Does the patient have difficulty seeing, even when wearing glasses/contacts?: No Does the patient have difficulty concentrating, remembering, or making decisions?: Yes Patient able to express need for assistance with ADLs?: Yes Does the patient have difficulty dressing or bathing?: Yes Independently performs ADLs?: Yes (appropriate for developmental age) Does the patient have difficulty walking or climbing stairs?: No Weakness of Legs: None Weakness of Arms/Hands: None  Permission Sought/Granted Permission sought to share information with : Case Manager                Emotional Assessment Appearance:: Appears stated age Attitude/Demeanor/Rapport: Unable to Assess Affect (typically observed): Unable to Assess Orientation: : Oriented to Self      Admission diagnosis:  Hypertensive emergency [I16.1] Acute on chronic diastolic CHF (congestive heart failure) (HCC) [I50.33] Acute on chronic congestive heart failure, unspecified heart failure type [facility]) [I50.9] Patient Active Problem List   Diagnosis Date Noted   Acute on chronic diastolic CHF (congestive heart failure) (HCC) 03/18/2022   Stage 3a chronic kidney disease (CKD) (HCC) 03/18/2022   Hypertensive emergency 09/08/2021   Elevated troponin 09/08/2021   Pulmonary edema 09/08/2021   Tobacco abuse 09/08/2021   Cocaine abuse (HCC) 09/08/2021   History of alcohol abuse 09/08/2021   Abnormal chest x-ray 09/08/2021   Acute respiratory distress 09/08/2021   PCP:  Patient, No Pcp Per Pharmacy:   [facility] [phone] Ginette Otto,  -  [address] AT [facility] [address] [facility] [address] [place] [address] Phone: [phone] Fax: [phone]  [facility] [address], [address] [place]  [address] Phone: [phone] Fax: [phone]  [facility] [address] [place] [address] Phone: [phone] Fax: [phone]     Social Determinants of Health (SDOH) Interventions Food Insecurity Interventions: Intervention Not Indicated Housing Interventions: Intervention Not Indicated Transportation Interventions: Intervention Not Indicated Alcohol Usage Interventions: Intervention Not Indicated (Score <7) Financial Strain Interventions: Intervention Not Indicated  Readmission Risk Interventions    03/26/2022   10:50 AM  Readmission Risk Prevention Plan  Transportation Screening Complete  HRI or Home Care Consult Complete  Social Work Consult for Recovery Care Planning/Counseling Complete  Palliative Care Screening Not Applicable  Medication Review Oceanographer) Referral to Pharmacy

## 2022-03-26 NOTE — Progress Notes (Signed)
Occupational Therapy Treatment Patient Details Name: Amanda [name] MRN: [id number] DOB: October 23, 1960 Today's Date: 03/26/2022   History of present illness 61 yo female presented to ED 12/4 with HTN d/c'd and advised to take her BP medication, returned to ED 12/5 with worsening SoB and hypertensive urgency significant orthopnea. CXR showed central vascular congestion and interstitial pulmonary edema. Pt admitted for treatment. Reported cocaine use about 1-2 weeks ago. PMH:HTN, polysubstance use (occasional cocaine, tobacco, marijuana, alcohol), CKD, and CHF.   OT comments  Pt seen for OT ADL retraining session with focus on bed mobility with min A, sitting up at EOB,  Min A for LB dressing and min-mod A for simulated toilet transfer/sit to stand and side stepping to [facility] in preparation prior to lunch. She continues to require consistent multimodal cues, and redirection to tasks for safety. Posey belt in place, bed alarm on and telesitter in room at conclusion of this session.   Recommendations for follow up therapy are one component of a multi-disciplinary discharge planning process, led by the attending physician.  Recommendations may be updated based on patient status, additional functional criteria and insurance authorization.    Follow Up Recommendations  Home health OT (anticipate good progress acutely. May need to consider SNF if cognition does not imporve)     Assistance Recommended at Discharge Frequent or constant Supervision/Assistance  Patient can return home with the following  A lot of help with walking and/or transfers;A lot of help with bathing/dressing/bathroom;Assistance with cooking/housework;Help with stairs or ramp for entrance;Assist for transportation   Equipment Recommendations  None recommended by OT    Recommendations for Other Services      Precautions / Restrictions Precautions Precautions: Fall Precaution Comments: telesitter, posey belt in bed and fall mat in  place       Mobility Bed Mobility Overal bed mobility: Needs Assistance Bed Mobility: Supine to Sit, Sit to Supine     Supine to sit: Supervision Sit to supine: Supervision, HOB elevated   General bed mobility comments: Pt does not take direction but is able to come to longsitting in bed without assist, and for scooting up in bed with use of LE to bridge. She requires multimodal cues when getting back into bed and coming into sitting to eat lunch. Posey applied, bed alarm on and tetl sitter in room for pt safety    Transfers Overall transfer level: Needs assistance   Transfers: Sit to/from Stand Sit to Stand: Min assist, Mod assist     Balance Overall balance assessment: Needs assistance Sitting-balance support: Feet supported, No upper extremity supported Sitting balance-Leahy Scale: Fair     Standing balance support: No upper extremity supported Standing balance-Leahy Scale: Poor Standing balance comment: Pt would benefit from RW use however she does not follow commands to use RW and instead steps around it despite mulimodal cues to use for increased safety. Pt also declined use of her cane despite verbal encouragement       ADL either performed or assessed with clinical judgement   ADL Overall ADL's : Needs assistance/impaired Eating/Feeding: Set up;Sitting;Modified independent Eating/Feeding Details (indicate cue type and reason): Pt trap table placed in front of her. Pt sitting up in bed   Lower Body Dressing: Minimal assistance;Sit to/from stand Lower Body Dressing Details (indicate cue type and reason): Pt sitting up at EOB to don slippers bilateral feet Toilet Transfer: Minimal assistance;Moderate assistance;BSC/3in1;Rolling walker (2 wheels) Toilet Transfer Details (indicate cue type and reason): Simulated transfer sit to stand from EOB  and marching in place followed by side step to reposition up higher in bed. Pt requires frequent vc and tc's as she appears to  attempt to do things her way rather than following commands. RW was placed in front of pt but she stepped around it despite discussion of using for increased safety   Functional mobility during ADLs: Minimal assistance;Moderate assistance General ADL Comments: Pt seen for OT ADL retraining session with focus on bed mobility with min A, sitting up at EOB,  Min A for LB dressing and min-mod A for simulated toilet transfer/sit to stand and side stepping to [facility] in preparation prior to lunch. She continues to require consistent multimodal cues, and redirection to tasks for safety. Posey belt in place, bed alarm on and telesitter in room at conclusion of this session.    Extremity/Trunk Assessment Upper Extremity Assessment Upper Extremity Assessment: Overall WFL for tasks assessed   Lower Extremity Assessment Lower Extremity Assessment: Defer to PT evaluation   Cervical / Trunk Assessment Cervical / Trunk Assessment: Normal    Vision Ability to See in Adequate Light: 0 Adequate Vision Assessment?: No apparent visual deficits          Cognition Arousal/Alertness: Awake/alert Behavior During Therapy: Restless, Impulsive, Flat affect Overall Cognitive Status: Impaired/Different from baseline Area of Impairment: Following commands, Safety/judgement, Awareness, Problem solving, Attention   Current Attention Level: Selective   Following Commands: Follows one step commands inconsistently Safety/Judgement: Decreased awareness of safety Awareness: Intellectual Problem Solving: Slow processing, Decreased initiation, Difficulty sequencing, Requires verbal cues, Requires tactile cues General Comments: Pt does not follow 2 step commands, follows 1 step commands inconsistently, Posey belt reappied after session - pt with decreased awareness of Posey belt, attempting to get OOB with belt on and rails up. Pt educated and assisted back to bed requiring multi modal cues and repetition with Posey belt and  bed alarm in place with telesitter at conslusion of session. Pt set up with lunch tray              General Comments VSS on RA    Pertinent Vitals/ Pain       Pain Assessment Pain Assessment: Faces Pain Score: 0-No pain  Home Living Family/patient expects to be discharged to:: Private residence Living Arrangements: Children Available Help at Discharge: Available PRN/intermittently Type of Home: House Home Access: Stairs to enter [employer] of Steps: 3-5   Home Layout: One level     Bathroom Shower/Tub: Tub/shower unit     Home Equipment: Kasandra Knudsen - single point      Prior Functioning/Environment   Please refer to initial OT assessment for details. Per chart review, pt ambulated with cane and reports Mod I bathing and dressing but no family present to confirm   Frequency  Min 2X/week        Progress Toward Goals  OT Goals(current goals can now be found in the care plan section)  Progress towards OT goals: Progressing toward goals  Acute Rehab OT Goals Patient Stated Goal: Go home OT Goal Formulation: With patient Time For Goal Achievement: 04/06/22 Potential to Achieve Goals: [place] Discharge plan remains appropriate       AM-PAC OT "6 Clicks" Daily Activity     Outcome Measure   Help from another person eating meals?: None Help from another person taking care of personal grooming?: A Little Help from another person toileting, which includes using toliet, bedpan, or urinal?: A Lot Help from another person bathing (including washing, rinsing,  drying)?: A Lot Help from another person to put on and taking off regular upper body clothing?: A Little Help from another person to put on and taking off regular lower body clothing?: A Lot 6 Click Score: 16    End of Session Equipment Utilized During Treatment: Rolling walker (2 wheels)  OT Visit Diagnosis: Unsteadiness on feet (R26.81);Other abnormalities of gait and mobility (R26.89);Repeated falls  (R29.6);Muscle weakness (generalized) (M62.81);History of falling (Z91.81)   Activity Tolerance Patient tolerated treatment well   Patient Left in bed;with call bell/phone within reach;with bed alarm set;with restraints reapplied (Posey belt, tele sitter)   Nurse Communication Mobility status        Time: 4034-7425 OT Time Calculation (min): 13 min  Charges: OT General Charges $OT Visit: 1 Visit OT Treatments $Self Care/Home Management : 8-22 mins   Sophie Tamez Beth Dixon, OTR/L 03/26/2022, 11:59 AM

## 2022-03-30 ENCOUNTER — Encounter (HOSPITAL_COMMUNITY): Payer: Medicaid Other

## 2022-03-30 ENCOUNTER — Telehealth (HOSPITAL_COMMUNITY): Payer: Self-pay | Admitting: *Deleted

## 2022-03-30 NOTE — Telephone Encounter (Signed)
Call attempted to confirm HV TOC appt 3 pm on 03/30/22. HIPPA appropriate VM left with callback number.    Rhae Hammock, BSN, Scientist, clinical (histocompatibility and immunogenetics) Only

## 2022-04-30 ENCOUNTER — Ambulatory Visit: Payer: Medicaid Other | Admitting: Physical Therapy

## 2022-05-19 ENCOUNTER — Emergency Department (HOSPITAL_COMMUNITY)
Admission: EM | Admit: 2022-05-19 | Discharge: 2022-05-19 | Disposition: A | Discharge status: Home / Self Care | Payer: Medicaid Other | Attending: Emergency Medicine | Admitting: Emergency Medicine

## 2022-05-19 ENCOUNTER — Encounter (HOSPITAL_COMMUNITY): Payer: Self-pay

## 2022-05-19 DIAGNOSIS — Z79899 Other long term (current) drug therapy: Secondary | ICD-10-CM | POA: Insufficient documentation

## 2022-05-19 DIAGNOSIS — R040 Epistaxis: Secondary | ICD-10-CM | POA: Diagnosis not present

## 2022-05-19 DIAGNOSIS — I160 Hypertensive urgency: Secondary | ICD-10-CM | POA: Diagnosis not present

## 2022-05-19 MED ORDER — SPIRONOLACTONE 25 MG PO TABS: 25.0000 mg | ORAL_TABLET | Freq: Every day | ORAL | 0 refills | Status: DC

## 2022-05-19 MED ORDER — HYDRALAZINE HCL 25 MG PO TABS: 75.0000 mg | ORAL_TABLET | Freq: Three times a day (TID) | ORAL | 0 refills | Status: DC

## 2022-05-19 MED ORDER — DILTIAZEM HCL ER COATED BEADS 240 MG PO CP24: 240.0000 mg | ORAL_CAPSULE | Freq: Every day | ORAL | 0 refills | Status: DC

## 2022-05-19 MED ORDER — HYDRALAZINE HCL 25 MG PO TABS
75.0000 mg | ORAL_TABLET | Freq: Once | ORAL | Status: AC
  Administered 2022-05-19: 75 mg via ORAL
  Filled 2022-05-19: qty 3

## 2022-05-19 MED ORDER — THIAMINE HCL 100 MG PO TABS: 100.0000 mg | ORAL_TABLET | Freq: Every day | ORAL | 0 refills | Status: DC

## 2022-05-19 MED ORDER — EMPAGLIFLOZIN 10 MG PO TABS: 10.0000 mg | ORAL_TABLET | Freq: Every day | ORAL | 0 refills | Status: DC

## 2022-05-19 MED ORDER — ISOSORBIDE MONONITRATE ER 30 MG PO TB24
60.0000 mg | ORAL_TABLET | Freq: Once | ORAL | Status: AC
  Administered 2022-05-19: 60 mg via ORAL
  Filled 2022-05-19: qty 2

## 2022-05-19 MED ORDER — DILTIAZEM HCL ER COATED BEADS 240 MG PO CP24
240.0000 mg | ORAL_CAPSULE | Freq: Once | ORAL | Status: AC
  Administered 2022-05-19: 240 mg via ORAL
  Filled 2022-05-19: qty 1

## 2022-05-19 MED ORDER — OXYMETAZOLINE HCL 0.05 % NA SOLN
1.0000 | Freq: Once | NASAL | Status: AC
  Administered 2022-05-19: 1 via NASAL
  Filled 2022-05-19: qty 30

## 2022-05-19 MED ORDER — ISOSORBIDE MONONITRATE ER 60 MG PO TB24: 60.0000 mg | ORAL_TABLET | Freq: Every day | ORAL | 0 refills | Status: DC

## 2022-05-19 NOTE — Discharge Instructions (Addendum)
As discussed, with your nosebleed it is importantly follow-up with our ENT colleagues in about 1 week for removal of the packing.  In the interim, please obtain and take your medications as previously prescribed.  Monitor your condition carefully and do not hesitate to return here for concerning changes.

## 2022-05-19 NOTE — ED Notes (Signed)
Patient called out for help, this RN went to check on her and her nose was bleeding again. RN notified EDP.

## 2022-05-19 NOTE — ED Notes (Signed)
EDP at bedside  

## 2022-05-19 NOTE — ED Provider Notes (Signed)
[facility] Provider Note   CSN: [id number] Arrival date & time: 05/19/22  1737     History  Chief Complaint  Patient presents with   Epistaxis    Pt bib EMS from home. Epistaxis x 40 minutes. Pt currently holding pressure to nose. No meds given en route.     Amanda [name] is a 62 y.o. female.  HPI Presents with epistaxis.  Bleeding is right-sided, began about 3 hours prior to ED arrival, has been persistent since then.  Patient notes that she ran out of her blood pressure medication sometime ago, has not taken anything and possibly as long as a few weeks.  When asked about her history of cocaine use she notes that she has not used this in possibly few days.  She takes no medication regularly.    Home Medications Prior to Admission medications   Medication Sig Start Date End Date Taking? Authorizing Provider  diltiazem (CARDIZEM CD) 240 MG 24 hr capsule Take 1 capsule (240 mg total) by mouth daily. 03/26/22   Bonnielee Haff, MD  empagliflozin (JARDIANCE) 10 MG TABS tablet Take 1 tablet (10 mg total) by mouth daily. 03/26/22   Bonnielee Haff, MD  hydrALAZINE (APRESOLINE) 25 MG tablet Take 3 tablets (75 mg total) by mouth 3 (three) times daily. 03/26/22   Bonnielee Haff, MD  isosorbide mononitrate (IMDUR) 60 MG 24 hr tablet Take 1 tablet (60 mg total) by mouth daily. 03/26/22   Bonnielee Haff, MD  QUEtiapine (SEROQUEL) 25 MG tablet Take 1 tablet (25 mg total) by mouth at bedtime for 14 days. 03/26/22 04/09/22  Bonnielee Haff, MD  spironolactone (ALDACTONE) 25 MG tablet Take 1 tablet (25 mg total) by mouth daily. 03/26/22   Bonnielee Haff, MD  thiamine (VITAMIN B1) 100 MG tablet Take 1 tablet (100 mg total) by mouth daily. 03/26/22   Bonnielee Haff, MD      Allergies    Patient has no known allergies.    Review of Systems   Review of Systems  All other systems reviewed and are negative.   Physical Exam Updated Vital Signs BP  110/86   Pulse 92   Temp 97.8 F (36.6 C) (Oral)   Resp (!) 21   Wt 59.3 kg   LMP 12/24/2015 (Within Days)   SpO2 98%   BMI 22.44 kg/m  Physical Exam Vitals and nursing note reviewed.  Constitutional:      Appearance: She is well-developed. She is ill-appearing.  HENT:     Head: Normocephalic and atraumatic.   Eyes:     Conjunctiva/sclera: Conjunctivae normal.  Cardiovascular:     Rate and Rhythm: Normal rate and regular rhythm.  Pulmonary:     Effort: Pulmonary effort is normal. No respiratory distress.     Breath sounds: Normal breath sounds. No stridor.  Abdominal:     General: There is no distension.  Skin:    General: Skin is warm and dry.  Neurological:     Mental Status: She is alert and oriented to person, place, and time.     Cranial Nerves: No cranial nerve deficit.  Psychiatric:        Mood and Affect: Mood normal.     ED Results / Procedures / Treatments   Labs (all labs ordered are listed, but only abnormal results are displayed) Labs Reviewed - No data to display  EKG None  Radiology No results found.  Procedures .Epistaxis Management  Date/Time: 05/19/2022 8:00  PM  Performed by: Carmin Muskrat, MD Authorized by: Carmin Muskrat, MD   Consent:    Consent obtained:  Verbal   Consent given by:  Patient   Risks, benefits, and alternatives were discussed: yes     Risks discussed:  Nasal injury, pain and bleeding   Alternatives discussed:  Alternative treatment Universal protocol:    Immediately prior to procedure, a time out was called: yes     Patient identity confirmed:  Verbally with patient Procedure details:    Treatment site:  R posterior   Treatment method:  Nasal balloon   Treatment complexity:  Extensive   Treatment episode: initial   Post-procedure details:    Assessment:  Bleeding stopped Comments:     After initial attempts with Afrin, and gauze were unsuccessful, patient had silver nitrate treatment of a focal area of  bleeding in the right nostril.  This temporarily resulted in hemostasis, but after coughing episode the patient was bleeding profusely, requiring placement of a nasal balloon.     Medications Ordered in ED Medications  oxymetazoline (AFRIN) 0.05 % nasal spray 1 spray (1 spray Each Nare Given by Other 05/19/22 1846)  diltiazem (CARDIZEM CD) 24 hr capsule 240 mg (240 mg Oral Given 05/19/22 1941)  hydrALAZINE (APRESOLINE) tablet 75 mg (75 mg Oral Given 05/19/22 1934)  isosorbide mononitrate (IMDUR) 24 hr tablet 60 mg (60 mg Oral Given 05/19/22 1934)    ED Course/ Medical Decision Making/ A&P                             Medical Decision Making Risk OTC drugs. Prescription drug management.   9:50 PM Patient's blood pressure has improved of receiving her home medications.  She has had no recurrence after revision of the initial treatment as above.  We discussed importance of not using cocaine, taking her medication as prescribed and she was provided a new set of prescriptions.  No evidence for decompensation, no hypotension suggesting substantial blood loss, no indication for admission, patient will follow-up with ENT.        Final Clinical Impression(s) / ED Diagnoses Final diagnoses:  Epistaxis  Hypertensive urgency    Rx / DC Orders ED Discharge Orders     None         Carmin Muskrat, MD 05/19/22 2150

## 2022-05-29 ENCOUNTER — Encounter (HOSPITAL_COMMUNITY): Payer: Self-pay

## 2022-05-29 ENCOUNTER — Observation Stay (HOSPITAL_COMMUNITY)
Admission: EM | Admit: 2022-05-29 | Discharge: 2022-05-30 | Disposition: A | Discharge status: Home / Self Care | Payer: Medicaid Other | Attending: Internal Medicine | Admitting: Internal Medicine

## 2022-05-29 DIAGNOSIS — N1831 Chronic kidney disease, stage 3a: Secondary | ICD-10-CM | POA: Diagnosis not present

## 2022-05-29 DIAGNOSIS — F17210 Nicotine dependence, cigarettes, uncomplicated: Secondary | ICD-10-CM | POA: Insufficient documentation

## 2022-05-29 DIAGNOSIS — I10 Essential (primary) hypertension: Secondary | ICD-10-CM | POA: Insufficient documentation

## 2022-05-29 DIAGNOSIS — D631 Anemia in chronic kidney disease: Secondary | ICD-10-CM | POA: Insufficient documentation

## 2022-05-29 DIAGNOSIS — R040 Epistaxis: Secondary | ICD-10-CM

## 2022-05-29 DIAGNOSIS — F1410 Cocaine abuse, uncomplicated: Secondary | ICD-10-CM | POA: Diagnosis present

## 2022-05-29 DIAGNOSIS — D649 Anemia, unspecified: Secondary | ICD-10-CM | POA: Diagnosis present

## 2022-05-29 DIAGNOSIS — Z79899 Other long term (current) drug therapy: Secondary | ICD-10-CM | POA: Insufficient documentation

## 2022-05-29 DIAGNOSIS — I5032 Chronic diastolic (congestive) heart failure: Secondary | ICD-10-CM | POA: Diagnosis not present

## 2022-05-29 DIAGNOSIS — I5042 Chronic combined systolic (congestive) and diastolic (congestive) heart failure: Secondary | ICD-10-CM | POA: Diagnosis present

## 2022-05-29 DIAGNOSIS — K922 Gastrointestinal hemorrhage, unspecified: Secondary | ICD-10-CM

## 2022-05-29 DIAGNOSIS — I130 Hypertensive heart and chronic kidney disease with heart failure and stage 1 through stage 4 chronic kidney disease, or unspecified chronic kidney disease: Secondary | ICD-10-CM | POA: Diagnosis not present

## 2022-05-29 DIAGNOSIS — I5043 Acute on chronic combined systolic (congestive) and diastolic (congestive) heart failure: Secondary | ICD-10-CM | POA: Diagnosis present

## 2022-05-29 DIAGNOSIS — E876 Hypokalemia: Secondary | ICD-10-CM | POA: Diagnosis not present

## 2022-05-29 DIAGNOSIS — D62 Acute posthemorrhagic anemia: Secondary | ICD-10-CM

## 2022-05-29 LAB — PROTIME-INR
INR: 1.1 (ref 0.8–1.2)
Prothrombin Time: 14 seconds (ref 11.4–15.2)

## 2022-05-29 LAB — RAPID URINE DRUG SCREEN, HOSP PERFORMED
Amphetamines: NOT DETECTED
Barbiturates: NOT DETECTED
Benzodiazepines: NOT DETECTED
Cocaine: POSITIVE — AB
Opiates: NOT DETECTED
Tetrahydrocannabinol: NOT DETECTED

## 2022-05-29 LAB — CBC WITH DIFFERENTIAL/PLATELET
Abs Immature Granulocytes: 0.05 10*3/uL (ref 0.00–0.07)
Basophils Absolute: 0 10*3/uL (ref 0.0–0.1)
Basophils Relative: 1 %
Eosinophils Absolute: 0.1 10*3/uL (ref 0.0–0.5)
Eosinophils Relative: 1 %
HCT: 29.2 % — ABNORMAL LOW (ref 36.0–46.0)
Hemoglobin: 9.5 g/dL — ABNORMAL LOW (ref 12.0–15.0)
Immature Granulocytes: 1 %
Lymphocytes Relative: 24 %
Lymphs Abs: 2 10*3/uL (ref 0.7–4.0)
MCH: 30 pg (ref 26.0–34.0)
MCHC: 32.5 g/dL (ref 30.0–36.0)
MCV: 92.1 fL (ref 80.0–100.0)
Monocytes Absolute: 0.7 10*3/uL (ref 0.1–1.0)
Monocytes Relative: 8 %
Neutro Abs: 5.4 10*3/uL (ref 1.7–7.7)
Neutrophils Relative %: 65 %
Platelets: 339 10*3/uL (ref 150–400)
RBC: 3.17 MIL/uL — ABNORMAL LOW (ref 3.87–5.11)
RDW: 17.8 % — ABNORMAL HIGH (ref 11.5–15.5)
WBC: 8.3 10*3/uL (ref 4.0–10.5)
nRBC: 0 % (ref 0.0–0.2)

## 2022-05-29 LAB — COMPREHENSIVE METABOLIC PANEL
ALT: 19 U/L (ref 0–44)
AST: 26 U/L (ref 15–41)
Albumin: 3.4 g/dL — ABNORMAL LOW (ref 3.5–5.0)
Alkaline Phosphatase: 59 U/L (ref 38–126)
Anion gap: 12 (ref 5–15)
BUN: 38 mg/dL — ABNORMAL HIGH (ref 6–20)
CO2: 28 mmol/L (ref 22–32)
Calcium: 9 mg/dL (ref 8.9–10.3)
Chloride: 96 mmol/L — ABNORMAL LOW (ref 98–111)
Creatinine, Ser: 1.22 mg/dL — ABNORMAL HIGH (ref 0.44–1.00)
GFR, Estimated: 51 mL/min — ABNORMAL LOW (ref >60)
Glucose, Bld: 128 mg/dL — ABNORMAL HIGH (ref 70–99)
Potassium: 3.4 mmol/L — ABNORMAL LOW (ref 3.5–5.1)
Sodium: 136 mmol/L (ref 135–145)
Total Bilirubin: 0.5 mg/dL (ref 0.3–1.2)
Total Protein: 7 g/dL (ref 6.5–8.1)

## 2022-05-29 LAB — MAGNESIUM: Magnesium: 2.3 mg/dL (ref 1.7–2.4)

## 2022-05-29 LAB — POC OCCULT BLOOD, ED: Fecal Occult Bld: POSITIVE — AB

## 2022-05-29 LAB — HEMOGLOBIN AND HEMATOCRIT, BLOOD
HCT: 26.9 % — ABNORMAL LOW (ref 36.0–46.0)
HCT: 26.5 % — ABNORMAL LOW (ref 36.0–46.0)
Hemoglobin: 8.5 g/dL — ABNORMAL LOW (ref 12.0–15.0)
Hemoglobin: 8.4 g/dL — ABNORMAL LOW (ref 12.0–15.0)

## 2022-05-29 LAB — ABO/RH: ABO/RH(D): O POS

## 2022-05-29 LAB — TYPE AND SCREEN
ABO/RH(D): O POS
Antibody Screen: NEGATIVE

## 2022-05-29 LAB — ETHANOL: Alcohol, Ethyl (B): <10 mg/dL (ref <10)

## 2022-05-29 MED ORDER — SODIUM CHLORIDE 0.9 % IV BOLUS
500.0000 mL | Freq: Once | INTRAVENOUS | Status: AC
  Administered 2022-05-29: 500 mL via INTRAVENOUS

## 2022-05-29 MED ORDER — ONDANSETRON HCL 4 MG/2ML IJ SOLN
4.0000 mg | Freq: Once | INTRAMUSCULAR | Status: AC
  Administered 2022-05-29: 4 mg via INTRAVENOUS
  Filled 2022-05-29: qty 2

## 2022-05-29 MED ORDER — OXYMETAZOLINE HCL 0.05 % NA SOLN
1.0000 | Freq: Once | NASAL | Status: DC
  Filled 2022-05-29: qty 30

## 2022-05-29 MED ORDER — SODIUM CHLORIDE 0.9% FLUSH
3.0000 mL | Freq: Two times a day (BID) | INTRAVENOUS | Status: DC
  Administered 2022-05-29: 3 mL via INTRAVENOUS

## 2022-05-29 MED ORDER — EMPAGLIFLOZIN 10 MG PO TABS
10.0000 mg | ORAL_TABLET | Freq: Every day | ORAL | Status: DC
  Administered 2022-05-30: 10 mg via ORAL
  Filled 2022-05-29: qty 1

## 2022-05-29 MED ORDER — POTASSIUM CHLORIDE 20 MEQ PO PACK
40.0000 meq | PACK | Freq: Two times a day (BID) | ORAL | Status: DC
  Administered 2022-05-29 – 2022-05-30 (×2): 40 meq via ORAL
  Filled 2022-05-29 (×2): qty 2

## 2022-05-29 MED ORDER — ACETAMINOPHEN 325 MG PO TABS: 650.0000 mg | ORAL_TABLET | Freq: Four times a day (QID) | ORAL | Status: DC | PRN

## 2022-05-29 MED ORDER — HYDRALAZINE HCL 50 MG PO TABS
75.0000 mg | ORAL_TABLET | Freq: Three times a day (TID) | ORAL | Status: DC
  Administered 2022-05-29 – 2022-05-30 (×2): 75 mg via ORAL
  Filled 2022-05-29 (×2): qty 1

## 2022-05-29 MED ORDER — POLYETHYLENE GLYCOL 3350 17 G PO PACK: 17.0000 g | PACK | Freq: Every day | ORAL | Status: DC | PRN

## 2022-05-29 MED ORDER — SPIRONOLACTONE 25 MG PO TABS
25.0000 mg | ORAL_TABLET | Freq: Every day | ORAL | Status: DC
  Administered 2022-05-30: 25 mg via ORAL
  Filled 2022-05-29: qty 1

## 2022-05-29 MED ORDER — ISOSORBIDE MONONITRATE ER 60 MG PO TB24
60.0000 mg | ORAL_TABLET | Freq: Every day | ORAL | Status: DC
  Administered 2022-05-30: 60 mg via ORAL
  Filled 2022-05-29: qty 1

## 2022-05-29 MED ORDER — DILTIAZEM HCL ER COATED BEADS 240 MG PO CP24
240.0000 mg | ORAL_CAPSULE | Freq: Every day | ORAL | Status: DC
  Administered 2022-05-30: 240 mg via ORAL
  Filled 2022-05-29: qty 1

## 2022-05-29 MED ORDER — ACETAMINOPHEN 650 MG RE SUPP: 650.0000 mg | Freq: Four times a day (QID) | RECTAL | Status: DC | PRN

## 2022-05-29 NOTE — H&P (Signed)
History and Physical   Amanda [name] [id number] DOB: 12/02/1960 DOA: 05/29/2022  PCP: Patient, No Pcp Per   Patient coming from: Home  Chief Complaint: Epistaxis  HPI: Amanda [name] is a 62 y.o. female with medical history significant of CKD 3A, CHF, hypertension, alcohol use, cocaine use, recent epistaxis presenting with recurrent epistaxis.  Patient had episode epistaxis on 2/6 was treated with Afrin and Rhino Rocket with ENT follow-up on 2/12.  Rhino Rocket removed at that time and there was no further active bleeding patient was given epistaxis precautions and told to avoid intranasal drug use.  Patient had recurrent right-sided nosebleeding in the early hours this morning for about 3 hours.  No clear trigger.  Has had some nausea and vomiting that was bloody presumably from swallowed blood with some associated abdominal discomfort and lightheadedness.  No further bleeding in the ED.  On further questioning, she states she has not snorted cocaine in a while but she last smoked crack cocaine the night prior to her epistaxis beginning.  She denies fevers, chills, chest pain, shortness of breath, constipation, diarrhea.  ED Course: Vital signs in the ED significant for blood pressure in the [id number] to 0000000 systolic.  Lab workup included CMP with potassium 3.4, chloride 96, creatinine of 1.27 which is stable, glucose 128, albumin 3.4.  CBC with hemoglobin of 9.5 which is down from 15 2 months ago and on repeat after only 500 cc down to 8.5.  PT and INR normal.  FOBT from stool positive.  UDS pending, ethanol level negative.  Patient received 500 cc IV fluids, Zofran, Afrin in the ED.  Given positive stool which could have been from ongoing/previous posterior epistaxis versus secondary bleeding source GI was consulted in the ED, no response yet per EDP despite Pages and messaging.  Review of Systems: As per HPI otherwise all other systems reviewed and are negative.  Past Medical History:   Diagnosis Date   Hypertension     Past Surgical History:  Procedure Laterality Date   I & D EXTREMITY Right 01/21/2014   Procedure: IRRIGATION AND DEBRIDEMENT RIGHT INDEX FINGER;  Surgeon: Leanora Cover, MD;  Location: [facility];  Service: Orthopedics;  Laterality: Right;    Social History  reports that she has been smoking cigarettes. She has never used smokeless tobacco. She reports current alcohol use. She reports that she does not currently use drugs after having used the following drugs: "Crack" cocaine.  No Known Allergies  Family History  Problem Relation Age of Onset   Heart disease Mother        had a pacemaker  Reviewed on admission  Prior to Admission medications   Medication Sig Start Date End Date Taking? Authorizing Provider  diltiazem (CARDIZEM CD) 240 MG 24 hr capsule Take 1 capsule (240 mg total) by mouth daily. 05/19/22  Yes Carmin Muskrat, MD  empagliflozin (JARDIANCE) 10 MG TABS tablet Take 1 tablet (10 mg total) by mouth daily. 05/19/22  Yes Carmin Muskrat, MD  hydrALAZINE (APRESOLINE) 25 MG tablet Take 3 tablets (75 mg total) by mouth 3 (three) times daily. 05/19/22  Yes Carmin Muskrat, MD  isosorbide mononitrate (IMDUR) 60 MG 24 hr tablet Take 1 tablet (60 mg total) by mouth daily. 05/19/22  Yes Carmin Muskrat, MD  Multiple Vitamins-Minerals (MULTIVITAMIN GUMMIES ADULT) [place] 2 each by mouth daily.   Yes [provider]  spironolactone (ALDACTONE) 25 MG tablet Take 1 tablet (25 mg total) by mouth daily. 05/19/22  Yes  Carmin Muskrat, MD  QUEtiapine (SEROQUEL) 25 MG tablet Take 1 tablet (25 mg total) by mouth at bedtime for 14 days. Patient not taking: Reported on 05/29/2022 03/26/22 04/09/22  Bonnielee Haff, MD  thiamine (VITAMIN B1) 100 MG tablet Take 1 tablet (100 mg total) by mouth daily. Patient not taking: Reported on 05/29/2022 05/19/22   Carmin Muskrat, MD    Physical Exam: Vitals:   05/29/22 1530 05/29/22 1538 05/29/22 1600 05/29/22 1630   BP: 135/87  (!) 144/84 136/85  Pulse: 80  83 82  Resp: 15  16 15  $ Temp:  97.8 F (36.6 C)    TempSrc:  Oral    SpO2: 95%  96% 95%    Physical Exam Constitutional:      General: She is not in acute distress.    Appearance: Normal appearance.  HENT:     Head: Normocephalic and atraumatic.     Mouth/Throat:     Mouth: Mucous membranes are moist.     Pharynx: Oropharynx is clear.  Eyes:     Extraocular Movements: Extraocular movements intact.     Pupils: Pupils are equal, round, and reactive to light.  Cardiovascular:     Rate and Rhythm: Normal rate and regular rhythm.     Pulses: Normal pulses.     Heart sounds: Normal heart sounds.  Pulmonary:     Effort: Pulmonary effort is normal. No respiratory distress.     Breath sounds: Normal breath sounds.  Abdominal:     General: Bowel sounds are normal. There is no distension.     Palpations: Abdomen is soft.     Tenderness: There is no abdominal tenderness.  Musculoskeletal:        General: No swelling or deformity.  Skin:    General: Skin is warm and dry.  Neurological:     General: No focal deficit present.     Mental Status: Mental status is at baseline.    Labs on Admission: I have personally reviewed following labs and imaging studies  CBC: Recent Labs  Lab 05/29/22 1041 05/29/22 1340  WBC 8.3  --   NEUTROABS 5.4  --   HGB 9.5* 8.5*  HCT 29.2* 26.9*  MCV 92.1  --   PLT 339  --     Basic Metabolic Panel: Recent Labs  Lab 05/29/22 1041  NA 136  K 3.4*  CL 96*  CO2 28  GLUCOSE 128*  BUN 38*  CREATININE 1.22*  CALCIUM 9.0    GFR: Estimated Creatinine Clearance: 42.3 mL/min (A) (by C-G formula based on SCr of 1.22 mg/dL (H)).  Liver Function Tests: Recent Labs  Lab 05/29/22 1041  AST 26  ALT 19  ALKPHOS 59  BILITOT 0.5  PROT 7.0  ALBUMIN 3.4*    Urine analysis:    Component Value Date/Time   COLORURINE STRAW (A) 09/09/2021 0015   APPEARANCEUR CLEAR 09/09/2021 0015   LABSPEC 1.008  09/09/2021 0015   PHURINE 6.0 09/09/2021 0015   GLUCOSEU NEGATIVE 09/09/2021 0015   HGBUR SMALL (A) 09/09/2021 0015   BILIRUBINUR NEGATIVE 09/09/2021 0015   BILIRUBINUR Small 10/11/2013 1200   KETONESUR NEGATIVE 09/09/2021 0015   PROTEINUR NEGATIVE 09/09/2021 0015   UROBILINOGEN 0.2 10/11/2013 1200   UROBILINOGEN 0.2 02/28/2008 1612   NITRITE NEGATIVE 09/09/2021 0015   LEUKOCYTESUR NEGATIVE 09/09/2021 0015    Radiological Exams on Admission: No results found.  EKG: Not performed in the emergency department  Assessment/Plan Principal Problem:   Symptomatic anemia Active Problems:  Stage 3a chronic kidney disease (CKD) (HCC)   Epistaxis   Symptomatic anemia Epistaxis Rule out GI bleed > Presenting with recurrent epistaxis and worsening anemia now at 8.5 from baseline of 15-16 2 months ago. > Had a previous episode on 2/6 which required Afrin and Rhino Rocket which was removed at ENT outpatient follow-up on 2/12.  No evidence of ongoing bleeding at that visit. > Recurrent episode overnight lasting 3 hours followed by abdominal discomfort and nausea with bloody vomiting presumably from swallowed blood.  Also reportedly felt presyncopal/lightheaded. > FOBT was positive in the stool could be from ongoing posterior all epistaxis/previous epistaxis, however EDP consulted GI for input/rule out of concurrent GI bleeding.  They will follow. > Further chart review and discussion with patient reveals that she has history of recurrent hypertensive urgency/emergency and cocaine/crack cocaine use.  She reports to me that she has not used intranasal cocaine in some time but her last crack cocaine use was the evening prior to this repeat epistaxis episode.  Suspect the two are related. - Monitor on telemetry - Appreciate GI input - Trend hemoglobin overnight - Monitor for any evidence of recurrent epistaxis or GI bleeding - Type and screen already performed  Hypertension - Continue home  diltiazem, hydralazine, Imdur, spironolactone  Diastolic CHF > Last echo in 2023 with EF 55-60%, G2 DD, normal RV function. - Continue home Imdur, spironolactone, Jardiance  CKD 3A Hypokalemia > Creatinine stable at 1.2 in the ED.  Mild hypokalemia at 3.4. - 40 mill equivalents p.o. potassium, check magnesium - Trend renal function and electrolytes  Alcohol use Cocaine use > Advised to avoid intranasal drug use due to recurrent epistaxis by ENT previously, reiterated this. > Ethanol level negative in the ED.  DVT prophylaxis: SCDs Code Status:   Full Family Communication:  None on admission Disposition Plan:   Patient is from:  Home  Anticipated DC to:  Home  Anticipated DC date:  1 to 2 days  Anticipated DC barriers: None  Consults called:  GI Admission status:  Observation, telemetry  Severity of Illness: The appropriate patient status for this patient is OBSERVATION. Observation status is judged to be reasonable and necessary in order to provide the required intensity of service to ensure the patient's safety. The patient's presenting symptoms, physical exam findings, and initial radiographic and laboratory data in the context of their medical condition is felt to place them at decreased risk for further clinical deterioration. Furthermore, it is anticipated that the patient will be medically stable for discharge from the hospital within 2 midnights of admission.    Marcelyn Bruins MD [facility]  How to contact the [facility] Attending or Consulting provider [place] or covering provider during after hours [place], for this patient?   Check the care team in [facility] and look for a) attending/consulting TR[facility] provider listed and b) the [facility] team listed Log into [email or web address] and use [place]'s universal password to access. If you do not have the password, please contact the hospital operator. Locate the [facility] provider you are looking for under [facility] and page to a  number that you can be directly reached. If you still have difficulty reaching the provider, please page the [facility] (Director on Call) for the Hospitalists listed on amion for assistance.  05/29/2022, 5:33 PM

## 2022-05-29 NOTE — ED Triage Notes (Signed)
Pt BIB [facility] from home for c/o nose bleed. Pt reports that nose was bleeding for approximately 3 hours. Upon EMS arrival, EMS noted pt to have bloody emesis. Pt is lethargic and complaining of abdominal pain and dizziness. She reports that she fell sometime after her nose started bleeding due to some water on the floor. She denies hitting her head.

## 2022-05-29 NOTE — ED Notes (Signed)
ED TO INPATIENT HANDOFF REPORT  ED Nurse Name and Phone #: Albina Billet [phone] Name/Age/Gender Amanda [name] 62 y.o. female Room/Bed: 002C/002C  Code Status   Code Status: Full Code  Home/SNF/Other Home Patient oriented to: self, place, time, and situation Is this baseline? Yes   Triage Complete: Triage complete  Chief Complaint Symptomatic anemia [D64.9]  Triage Note Pt BIB [facility] from home for c/o nose bleed. Pt reports that nose was bleeding for approximately 3 hours. Upon EMS arrival, EMS noted pt to have bloody emesis. Pt is lethargic and complaining of abdominal pain and dizziness. She reports that she fell sometime after her nose started bleeding due to some water on the floor. She denies hitting her head.    Allergies No Known Allergies  Level of Care/Admitting Diagnosis ED Disposition   ED Disposition: Admit Condition: None Comment: Hospital Area: [place] [100100]  Level of Care: Telemetry Medical [104]  May place patient in observation at [facility] or [place] if equivalent level of care is available:: No  Covid Evaluation: Asymptomatic - no recent exposure (last 10 days) testing not required  Diagnosis: Symptomatic anemia [id number]  Admitting Physician: Marcelyn Bruins [id number]  Attending Physician: Marcelyn Bruins [id number]      B Medical/Surgery History Past Medical History:  Diagnosis Date   Hypertension    Past Surgical History:  Procedure Laterality Date   I & D EXTREMITY Right 01/21/2014   Procedure: IRRIGATION AND DEBRIDEMENT RIGHT INDEX FINGER;  Surgeon: Leanora Cover, MD;  Location: [place];  Service: Orthopedics;  Laterality: Right;     A IV Location/Drains/Wounds Patient Lines/Drains/Airways Status     Active Line/Drains/Airways     Name Placement date Placement time Site Days   Peripheral IV 05/29/22 20 G Left Antecubital 05/29/22  1109  Antecubital  less than 1   External Urinary Catheter 05/29/22  1345   no documentation  less than 1            Intake/Output Last 24 hours No intake or output data in the 24 hours ending 05/29/22 1738  Labs/Imaging Results for orders placed or performed during the hospital encounter of 05/29/22 (from the past 48 hour(s))  CBC with Differential     Status: Abnormal   Collection Time: 05/29/22 10:41 AM  Result Value Ref Range   WBC 8.3 4.0 - 10.5 K/uL   RBC 3.17 (L) 3.87 - 5.11 MIL/uL   Hemoglobin 9.5 (L) 12.0 - 15.0 g/dL   HCT 29.2 (L) 36.0 - 46.0 %   MCV 92.1 80.0 - 100.0 fL   MCH 30.0 26.0 - 34.0 pg   MCHC 32.5 30.0 - 36.0 g/dL   RDW 17.8 (H) 11.5 - 15.5 %   Platelets 339 150 - 400 K/uL   nRBC 0.0 0.0 - 0.2 %   Neutrophils Relative % 65 %   Neutro Abs 5.4 1.7 - 7.7 K/uL   Lymphocytes Relative 24 %   Lymphs Abs 2.0 0.7 - 4.0 K/uL   Monocytes Relative 8 %   Monocytes Absolute 0.7 0.1 - 1.0 K/uL   Eosinophils Relative 1 %   Eosinophils Absolute 0.1 0.0 - 0.5 K/uL   Basophils Relative 1 %   Basophils Absolute 0.0 0.0 - 0.1 K/uL   Immature Granulocytes 1 %   Abs Immature Granulocytes 0.05 0.00 - 0.07 K/uL    Comment: Performed at [facility], [address], [place], [place] [address]  Comprehensive metabolic panel  Status: Abnormal   Collection Time: 05/29/22 10:41 AM  Result Value Ref Range   Sodium 136 135 - 145 mmol/L   Potassium 3.4 (L) 3.5 - 5.1 mmol/L   Chloride 96 (L) 98 - 111 mmol/L   CO2 28 22 - 32 mmol/L   Glucose, Bld 128 (H) 70 - 99 mg/dL    Comment: Glucose reference range applies only to samples taken after fasting for at least 8 hours.   BUN 38 (H) 6 - 20 mg/dL   Creatinine, Ser 1.22 (H) 0.44 - 1.00 mg/dL   Calcium 9.0 8.9 - 10.3 mg/dL   Total Protein 7.0 6.5 - 8.1 g/dL   Albumin 3.4 (L) 3.5 - 5.0 g/dL   AST 26 15 - 41 U/L   ALT 19 0 - 44 U/L   Alkaline Phosphatase 59 38 - 126 U/L   Total Bilirubin 0.5 0.3 - 1.2 mg/dL   GFR, Estimated 51 (L) >60 mL/min    Comment: (NOTE) Calculated using the CKD-EPI  Creatinine Equation (2021)    Anion gap 12 5 - 15    Comment: Performed at [facility] [address], [place], [place] [address]  Ethanol     Status: None   Collection Time: 05/29/22 10:41 AM  Result Value Ref Range   Alcohol, Ethyl (B) <10 <10 mg/dL    Comment: (NOTE) Lowest detectable limit for serum alcohol is 10 mg/dL.  For medical purposes only. Performed at [facility], [place] [address], [place], [place] [address]   Protime-INR     Status: None   Collection Time: 05/29/22 10:41 AM  Result Value Ref Range   Prothrombin Time 14.0 11.4 - 15.2 seconds   INR 1.1 0.8 - 1.2    Comment: (NOTE) INR goal varies based on device and disease states. Performed at [facility], [place] [address], [place], [place] [address]   ABO/Rh     Status: None   Collection Time: 05/29/22 10:41 AM  Result Value Ref Range   ABO/RH(D)      O POS Performed at [facility] [address], [place], [place] [address]   Type and screen [place]     Status: None   Collection Time: 05/29/22  1:40 PM  Result Value Ref Range   ABO/RH(D) O POS    Antibody Screen NEG    Sample Expiration      06/01/2022,2359 Performed at [facility], [place] [address], [place], [place] [address]   Hemoglobin and hematocrit, blood     Status: Abnormal   Collection Time: 05/29/22  1:40 PM  Result Value Ref Range   Hemoglobin 8.5 (L) 12.0 - 15.0 g/dL   HCT 26.9 (L) 36.0 - 46.0 %    Comment: Performed at [facility], [place] [address] [place], [place] [address]  POC occult blood, ED Provider will collect     Status: Abnormal   Collection Time: 05/29/22  3:02 PM  Result Value Ref Range   Fecal Occult Bld POSITIVE (A) NEGATIVE   No results found.  Pending Labs Unresulted Labs (From admission, onward)     Start     Ordered   05/30/22 0500  Comprehensive metabolic panel  Tomorrow morning,   R        05/29/22 1730   05/30/22 0001  CBC  Now then every 6 hours,   R  (with TIMED occurrences)      05/29/22  1730   05/29/22 1725  Hemoglobin and hematocrit, blood  Once,   STAT        05/29/22 1724   05/29/22 1041  Urine rapid drug screen (hosp performed)  Once,   STAT        05/29/22 1041            Vitals/Pain Today's Vitals   05/29/22 1530 05/29/22 1538 05/29/22 1600 05/29/22 1630  BP: 135/87  (Abnormal) 144/84 136/85  Pulse: 80  83 82  Resp: 15  16 15  $ Temp:  97.8 F (36.6 C)    TempSrc:  Oral    SpO2: 95%  96% 95%    Isolation Precautions No active isolations  Medications Medications  oxymetazoline (AFRIN) 0.05 % nasal spray 1 spray (has no administration in time range)  diltiazem (CARDIZEM CD) 24 hr capsule 240 mg (has no administration in time range)  hydrALAZINE (APRESOLINE) tablet 75 mg (has no administration in time range)  isosorbide mononitrate (IMDUR) 24 hr tablet 60 mg (has no administration in time range)  spironolactone (ALDACTONE) tablet 25 mg (has no administration in time range)  empagliflozin (JARDIANCE) tablet 10 mg (has no administration in time range)  sodium chloride flush (NS) 0.9 % injection 3 mL (has no administration in time range)  acetaminophen (TYLENOL) tablet 650 mg (has no administration in time range)    Or  acetaminophen (TYLENOL) suppository 650 mg (has no administration in time range)  polyethylene glycol (MIRALAX / GLYCOLAX) packet 17 g (has no administration in time range)  ondansetron (ZOFRAN) injection 4 mg (4 mg Intravenous Given 05/29/22 1109)  sodium chloride 0.9 % bolus 500 mL (500 mLs Intravenous New Bag/Given 05/29/22 1109)    Mobility walks with person assist     Focused Assessments GI   R Recommendations: See Admitting Provider Note  Report given to:   Additional Notes: Patient A&Ox4. Polysubstance abuse. Can walk to bathroom with assist.

## 2022-05-29 NOTE — ED Provider Notes (Signed)
[facility] Provider Note   CSN: [id number] Arrival date & time: 05/29/22  1025     History  No chief complaint on file.   Amanda [name] is a 62 y.o. female.  62 year old female brought in by EMS with concern for right-sided epistaxis and vomiting.  Patient reports right-sided nosebleed spontaneously while at home today, lasted about 3 hours, felt presyncopal and had vomiting with bloody emesis which she suspects is related to swallowing all the blood from her nosebleed.  She denies any injury to her nose. Past medical history of hypertension, reports regular alcohol use with last ingestion of cocktails last night, also prior cocaine use.       Home Medications Prior to Admission medications   Medication Sig Start Date End Date Taking? Authorizing Provider  diltiazem (CARDIZEM CD) 240 MG 24 hr capsule Take 1 capsule (240 mg total) by mouth daily. 05/19/22  Yes Carmin Muskrat, MD  empagliflozin (JARDIANCE) 10 MG TABS tablet Take 1 tablet (10 mg total) by mouth daily. 05/19/22  Yes Carmin Muskrat, MD  hydrALAZINE (APRESOLINE) 25 MG tablet Take 3 tablets (75 mg total) by mouth 3 (three) times daily. 05/19/22  Yes Carmin Muskrat, MD  isosorbide mononitrate (IMDUR) 60 MG 24 hr tablet Take 1 tablet (60 mg total) by mouth daily. 05/19/22  Yes Carmin Muskrat, MD  Multiple Vitamins-Minerals (MULTIVITAMIN GUMMIES ADULT) [place] 2 each by mouth daily.   Yes [provider]  spironolactone (ALDACTONE) 25 MG tablet Take 1 tablet (25 mg total) by mouth daily. 05/19/22  Yes Carmin Muskrat, MD  QUEtiapine (SEROQUEL) 25 MG tablet Take 1 tablet (25 mg total) by mouth at bedtime for 14 days. Patient not taking: Reported on 05/29/2022 03/26/22 04/09/22  Bonnielee Haff, MD  thiamine (VITAMIN B1) 100 MG tablet Take 1 tablet (100 mg total) by mouth daily. Patient not taking: Reported on 05/29/2022 05/19/22   Carmin Muskrat, MD      Allergies     Patient has no known allergies.    Review of Systems   Review of Systems Negative except as per HPI Physical Exam Updated Vital Signs BP 136/85   Pulse 82   Temp 97.8 F (36.6 C) (Oral)   Resp 15   LMP 12/24/2015 (Within Days)   SpO2 95%  Physical Exam Vitals and nursing note reviewed. Exam conducted with a chaperone present.  Constitutional:      General: She is not in acute distress.    Appearance: She is well-developed. She is not diaphoretic.     Comments: Alert to verbal stimuli   HENT:     Head: Normocephalic and atraumatic.     Nose:     Right Nostril: Epistaxis present.     Left Nostril: No epistaxis.     Comments: Blood in right nostril, no active bleeding     Mouth/Throat:     Mouth: Mucous membranes are moist.     Comments: Brown blood on tongue Eyes:     Conjunctiva/sclera: Conjunctivae normal.  Cardiovascular:     Rate and Rhythm: Normal rate and regular rhythm.     Heart sounds: Normal heart sounds.  Pulmonary:     Effort: Pulmonary effort is normal.     Breath sounds: Normal breath sounds.  Abdominal:     Palpations: Abdomen is soft.     Tenderness: There is no abdominal tenderness.  Genitourinary:    Rectum: Guaiac result positive.  Musculoskeletal:  Cervical back: Neck supple.  Skin:    General: Skin is warm and dry.     Findings: No erythema or rash.  Neurological:     Mental Status: She is oriented to person, place, and time.  Psychiatric:        Behavior: Behavior normal.     ED Results / Procedures / Treatments   Labs (all labs ordered are listed, but only abnormal results are displayed) Labs Reviewed  CBC WITH DIFFERENTIAL/PLATELET - Abnormal; Notable for the following components:      Result Value   RBC 3.17 (*)    Hemoglobin 9.5 (*)    HCT 29.2 (*)    RDW 17.8 (*)    All other components within normal limits  COMPREHENSIVE METABOLIC PANEL - Abnormal; Notable for the following components:   Potassium 3.4 (*)    Chloride 96  (*)    Glucose, Bld 128 (*)    BUN 38 (*)    Creatinine, Ser 1.22 (*)    Albumin 3.4 (*)    GFR, Estimated 51 (*)    All other components within normal limits  HEMOGLOBIN AND HEMATOCRIT, BLOOD - Abnormal; Notable for the following components:   Hemoglobin 8.5 (*)    HCT 26.9 (*)    All other components within normal limits  HEMOGLOBIN AND HEMATOCRIT, BLOOD - Abnormal; Notable for the following components:   Hemoglobin 8.4 (*)    HCT 26.5 (*)    All other components within normal limits  POC OCCULT BLOOD, ED - Abnormal; Notable for the following components:   Fecal Occult Bld POSITIVE (*)    All other components within normal limits  ETHANOL  PROTIME-INR  RAPID URINE DRUG SCREEN, HOSP PERFORMED  CBC  COMPREHENSIVE METABOLIC PANEL  MAGNESIUM  CBC  TYPE AND SCREEN  ABO/RH    EKG None  Radiology No results found.  Procedures .Critical Care  Performed by: Tacy Learn, PA-C Authorized by: Tacy Learn, PA-C   Critical care provider statement:    Critical care time (minutes):  30   Critical care was time spent personally by me on the following activities:  Development of treatment plan with patient or surrogate, discussions with consultants, evaluation of patient's response to treatment, examination of patient, ordering and review of laboratory studies, ordering and review of radiographic studies, ordering and performing treatments and interventions, pulse oximetry, re-evaluation of patient's condition and review of old charts     Medications Ordered in ED Medications  oxymetazoline (AFRIN) 0.05 % nasal spray 1 spray (has no administration in time range)  diltiazem (CARDIZEM CD) 24 hr capsule 240 mg (has no administration in time range)  hydrALAZINE (APRESOLINE) tablet 75 mg (has no administration in time range)  isosorbide mononitrate (IMDUR) 24 hr tablet 60 mg (has no administration in time range)  spironolactone (ALDACTONE) tablet 25 mg (has no administration in  time range)  empagliflozin (JARDIANCE) tablet 10 mg (has no administration in time range)  sodium chloride flush (NS) 0.9 % injection 3 mL (has no administration in time range)  acetaminophen (TYLENOL) tablet 650 mg (has no administration in time range)    Or  acetaminophen (TYLENOL) suppository 650 mg (has no administration in time range)  polyethylene glycol (MIRALAX / GLYCOLAX) packet 17 g (has no administration in time range)  potassium chloride (KLOR-CON) packet 40 mEq (has no administration in time range)  ondansetron (ZOFRAN) injection 4 mg (4 mg Intravenous Given 05/29/22 1109)  sodium chloride 0.9 % bolus 500  mL (0 mLs Intravenous Stopped 05/29/22 1753)    ED Course/ Medical Decision Making/ A&P                             Medical Decision Making Amount and/or Complexity of Data Reviewed Labs: ordered.  Risk OTC drugs. Prescription drug management. Decision regarding hospitalization.   This patient presents to the ED for concern of epistaxis, hematemesis, this involves an extensive number of treatment options, and is a complaint that carries with it a high risk of complications and morbidity.  The differential diagnosis includes epistaxis, thrombocytopenia, GI bleed (consider varices given hx of alcohol use), symptomatic anemia     Co morbidities that complicate the patient evaluation  Cocaine abuse, alcohol use, CKD 3, CHF, hypertension   Additional history obtained:  External records from outside source obtained and reviewed including prior ER visit 05/19/22 for epistaxis, follow up with ENT on 05/25/22- ER treatment with Afrin and then rhino rocket for persistent bleeding. No further bleeding after rhino rocket placed. Rocket removed, no further bleeding, no septal perforation    Lab Tests:  I Ordered, and personally interpreted labs.  The pertinent results include: CBC with hemoglobin 9.5, hematocrit 29.2.  CMP with creatinine 1.22, BUN 38.  Alcohol negative.  INR  within normal notes.  Repeat hemoglobin 8.5, 8.4   Consultations Obtained:  I requested consultation with the Dr. Trilby Drummer with hospitalist service,  and discussed lab and imaging findings as well as pertinent plan - they recommend: Admission Dr. Havery Moros with GI consulted, GI will consult, Hemoccult positive likely due to epistaxis Dr. Jeanell Sparrow, ER attending, agrees with plan of care   Problem List / ED Course / Critical interventions / Medication management  62 year old female brought in by EMS after right-sided nosebleed today and 1 episode of hematemesis.  Felt like she was going to pass out, unsure if she did.  She arrives the emergency room, bleeding is controlled, she is alert to verbal stimuli and falls asleep frequently, reports last alcohol drink was last night, denies recent cocaine use.  Vital stable throughout her stay in the emergency room.  She was found to have a significant drop in her hemoglobin, previously 17 in November 2023, now 9.5.  She was given a 500 mL bolus of fluids and a repeat hemoglobin dropped to 8.5, possibly secondary to IV fluids.  Discussed with patient admission versus close follow-up with PCP.  She does not have a PCP.  Due to lack of close follow-up and concern for significant blood loss, patient is agreeable to admission the hospital.  She is found be Hemoccult positive, possibly related to her nosebleed last week versus concern for GI bleed.  GI agrees to consult, hospitalist to admit.  Patient agreeable with blood transfusion if emergently needed however declines at this time for symptomatic anemia. I have reviewed the patients home medicines and have made adjustments as needed   Social Determinants of Health:  No PCP currently   Test / Admission - Considered:  Admit for further management         Final Clinical Impression(s) / ED Diagnoses Final diagnoses:  Anemia associated with acute blood loss  Epistaxis  Gastrointestinal hemorrhage,  unspecified gastrointestinal hemorrhage type    Rx / DC Orders ED Discharge Orders     None         Tacy Learn, PA-C 05/29/22 1825    Pattricia Boss, MD 05/30/22 1523

## 2022-05-30 ENCOUNTER — Other Ambulatory Visit: Payer: Self-pay

## 2022-05-30 DIAGNOSIS — D62 Acute posthemorrhagic anemia: Secondary | ICD-10-CM

## 2022-05-30 DIAGNOSIS — R040 Epistaxis: Secondary | ICD-10-CM | POA: Diagnosis not present

## 2022-05-30 DIAGNOSIS — D649 Anemia, unspecified: Secondary | ICD-10-CM | POA: Diagnosis not present

## 2022-05-30 LAB — COMPREHENSIVE METABOLIC PANEL
ALT: 18 U/L (ref 0–44)
AST: 23 U/L (ref 15–41)
Albumin: 3.2 g/dL — ABNORMAL LOW (ref 3.5–5.0)
Alkaline Phosphatase: 57 U/L (ref 38–126)
Anion gap: 10 (ref 5–15)
BUN: 21 mg/dL — ABNORMAL HIGH (ref 6–20)
CO2: 24 mmol/L (ref 22–32)
Calcium: 9 mg/dL (ref 8.9–10.3)
Chloride: 103 mmol/L (ref 98–111)
Creatinine, Ser: 0.92 mg/dL (ref 0.44–1.00)
GFR, Estimated: >60 mL/min (ref >60)
Glucose, Bld: 88 mg/dL (ref 70–99)
Potassium: 3.8 mmol/L (ref 3.5–5.1)
Sodium: 137 mmol/L (ref 135–145)
Total Bilirubin: 0.4 mg/dL (ref 0.3–1.2)
Total Protein: 6.7 g/dL (ref 6.5–8.1)

## 2022-05-30 LAB — CBC
HCT: 26.5 % — ABNORMAL LOW (ref 36.0–46.0)
Hemoglobin: 8.8 g/dL — ABNORMAL LOW (ref 12.0–15.0)
MCH: 29.7 pg (ref 26.0–34.0)
MCHC: 33.2 g/dL (ref 30.0–36.0)
MCV: 89.5 fL (ref 80.0–100.0)
Platelets: 328 10*3/uL (ref 150–400)
RBC: 2.96 MIL/uL — ABNORMAL LOW (ref 3.87–5.11)
RDW: 17.5 % — ABNORMAL HIGH (ref 11.5–15.5)
WBC: 6.4 10*3/uL (ref 4.0–10.5)
nRBC: 0 % (ref 0.0–0.2)

## 2022-05-30 MED ORDER — EMPAGLIFLOZIN 10 MG PO TABS: 10.0000 mg | ORAL_TABLET | Freq: Every day | ORAL | 2 refills | Status: AC

## 2022-05-30 MED ORDER — SPIRONOLACTONE 25 MG PO TABS: 25.0000 mg | ORAL_TABLET | Freq: Every day | ORAL | 2 refills | Status: DC

## 2022-05-30 MED ORDER — LABETALOL HCL 5 MG/ML IV SOLN: 10.0000 mg | INTRAVENOUS | Status: DC | PRN

## 2022-05-30 MED ORDER — HYDRALAZINE HCL 25 MG PO TABS: 75.0000 mg | ORAL_TABLET | Freq: Three times a day (TID) | ORAL | 2 refills | Status: DC

## 2022-05-30 MED ORDER — DILTIAZEM HCL ER COATED BEADS 240 MG PO CP24: 240.0000 mg | ORAL_CAPSULE | Freq: Every day | ORAL | 2 refills | Status: DC

## 2022-05-30 MED ORDER — ISOSORBIDE MONONITRATE ER 60 MG PO TB24: 60.0000 mg | ORAL_TABLET | Freq: Every day | ORAL | 2 refills | Status: DC

## 2022-05-30 NOTE — TOC Transition Note (Signed)
Transition of Care [facility]) - CM/SW Discharge Note   Patient Details  Name: ESTELLA MONTEIRO MRN: [id number] Date of Birth: Oct 28, 1960  [facility] CM/SW Contact:  Tom-Johnson, Renea Ee, RN Phone Number: 05/30/2022, 1:17 PM   Clinical Narrative:     Patient is scheduled for discharge today. New patient appointment schedule on AVS. Family to transport at discharge. No further TOC needs noted.           Final next level of care: Home/Self Care Barriers to Discharge: Barriers Resolved   Patient Goals and CMS Choice CMS Medicare.gov Compare Post Acute Care list provided to:: Patient Choice offered to / list presented to : NA  Discharge Placement                  Patient to be transferred to facility by: Family      Discharge Plan and Services Additional resources added to the After Visit Summary for                  DME Arranged: N/A DME Agency: NA       HH Arranged: NA HH Agency: NA        Social Determinants of Health (SDOH) Interventions SDOH Screenings   Food Insecurity: No Food Insecurity (05/30/2022)  Housing: Low Risk  (05/30/2022)  Transportation Needs: No Transportation Needs (05/30/2022)  Utilities: Not At Risk (05/30/2022)  Alcohol Screen: Low Risk  (03/19/2022)  Financial Resource Strain: High Risk (03/19/2022)  Tobacco Use: High Risk (05/29/2022)     Readmission Risk Interventions    03/26/2022   10:50 AM  Readmission Risk Prevention Plan  Transportation Screening Complete  HRI or [place] Complete  Social Work Consult for [place] Planning/Counseling [place] Not Applicable  Medication Review Press photographer) Referral to Pharmacy

## 2022-05-30 NOTE — Progress Notes (Signed)
Pt has been discharged since 1400. Was called in room by pt. She stated that she is waiting for her husband to bring her clothes and take her home. She is not sure what time he will be here. Asked pt to let nurse know when husband arrives

## 2022-05-30 NOTE — Progress Notes (Signed)
Carlisle Beers to be D/C'd  per MD order.  Discussed with the patient and all questions fully answered.  VSS, Skin clean, dry and intact without evidence of skin break down, no evidence of skin tears noted.  IV catheter discontinued intact. Site without signs and symptoms of complications. Dressing and pressure applied.  An After Visit Summary was printed and given to the patient. Patient received prescription.  D/c education completed with patient/family including follow up instructions, medication list, d/c activities limitations if indicated, with other d/c instructions as indicated by MD - patient able to verbalize understanding, all questions fully answered.   Patient instructed to return to ED, call 911, or call MD for any changes in condition.   Patient to be escorted via [place], and D/C home via private auto.

## 2022-05-30 NOTE — Plan of Care (Signed)

## 2022-05-30 NOTE — Consult Note (Addendum)
Referring Provider: Suella Broad PA-C Primary Care Physician:  Patient, No Pcp Per Primary Gastroenterologist:  Althia Forts   Reason for Consultation: Anemia, epistaxis and possible GI bleed  HPI: Amanda [name] is a 62 y.o. female with a past medical history of hypertension, diastolic CHF, alcohol use disorder, cocaine abuse, CKD stage IIIa,  She presented to the ED 05/29/2022 by EMS secondary to right-sided epistaxis and vomiting.  She reported having a right-sided nosebleed yesterday which started abruptly and lasted approximately 3 hours.  She felt near faint and vomited up bloody emesis.  She drinks alcohol and uses cocaine on a regular basis.  Last alcohol and cocaine use was 05/28/2022.  Hematemesis secondary to swallowing blood from epistaxis.  A GI consult was requested to rule out any upper GI involvement with history of alcohol use disorder.  Labs in the ED showed a hemoglobin of 9.5 (Hg 15 - 29 Mar 2022).  Hematocrit 29.2.  MCV 92.1.  Platelet 339.  Potassium 3.4.  BUN 38 (BUN 23 on 03/2022).  Creatinine 1.22.  Normal LFTs.  INR 1.1.  Ethyl alcohol < 10.   She was seen by ENT at [facility] 05/25/2022 due to having recurrent epistaxis after she was seen in the ED 05/19/2022 for right-sided epistaxis.  At that time, she was not taking her blood pressure medication for several weeks.  She was initially treated with Afrin, manual pressure and subsequently had a Rhino Rocket placed with hemostasis.  When seen by ENT, she endorsed being compliant with her antihypertensive medications without any further nasal bleeding Rhino Rocket packing was removed. Nasal exam showed the following:   Nose: No gross deformity or lesions. No purulent discharge. Rhino Rocket in place in right nostril, following removal, no evidence of active bleeding or significant mucosal injury. Septum relatively midline with no evidence of perforation, left nasal cavity clear. 2+ turbinate hypertrophy with normal  mucosa.   She was instructed to use nasal rinses and to apply Vaseline ointment to bilateral nares nightly.  She was counseled on abstaining from intranasal drug use and nicotine use to avoid current nosebleeds.  She had recurrence of epistaxis Thursday 05/28/2022 into Friday early a.m. after drinking 3 cocktails, 2 shots of gin and snorting cocaine.  She described having a heavy nosebleed from the right nostril and could feel a lot of blood in her mouth which she continuously swallowed. She vomited up a large amount of red blood and felt like she might pass out and called 911 as noted above. No further epistaxis 6 or hematemesis since admission.  She denies having any heartburn or dysphagia.  No history of GERD or stomach ulcers.  She denies NSAID use.  She endorses drinking alcohol ( 2 to 3 cocktails and 2 shots of liquor) and snorting or smoking cocaine approximately 8 days monthly.  She smokes 1 pack of cigarettes daily for + years.  She typically passes a normal brown stool, stools are green if she eats greasy food.  No rectal bleeding or black stools.  No BM since admission.  He denies ever undergoing an EGD or colonoscopy.  No known family history of esophageal, gastric or colon cancer. She did not utilize the nasal rinses or Vaseline to her nares as recommended by ENT.   Past Medical History:  Diagnosis Date   Acute respiratory distress 09/08/2021   Elevated troponin 09/08/2021   Hypertension    Pulmonary edema 09/08/2021    Past Surgical History:  Procedure Laterality  Date   I & D EXTREMITY Right 01/21/2014   Procedure: IRRIGATION AND DEBRIDEMENT RIGHT INDEX FINGER;  Surgeon: Leanora Cover, MD;  Location: [facility];  Service: Orthopedics;  Laterality: Right;    Prior to Admission medications   Medication Sig Start Date End Date Taking? Authorizing Provider  diltiazem (CARDIZEM CD) 240 MG 24 hr capsule Take 1 capsule (240 mg total) by mouth daily. 05/19/22  Yes Carmin Muskrat, MD   empagliflozin (JARDIANCE) 10 MG TABS tablet Take 1 tablet (10 mg total) by mouth daily. 05/19/22  Yes Carmin Muskrat, MD  hydrALAZINE (APRESOLINE) 25 MG tablet Take 3 tablets (75 mg total) by mouth 3 (three) times daily. 05/19/22  Yes Carmin Muskrat, MD  isosorbide mononitrate (IMDUR) 60 MG 24 hr tablet Take 1 tablet (60 mg total) by mouth daily. 05/19/22  Yes Carmin Muskrat, MD  Multiple Vitamins-Minerals (MULTIVITAMIN GUMMIES ADULT) [place] 2 each by mouth daily.   Yes [provider]  spironolactone (ALDACTONE) 25 MG tablet Take 1 tablet (25 mg total) by mouth daily. 05/19/22  Yes Carmin Muskrat, MD  QUEtiapine (SEROQUEL) 25 MG tablet Take 1 tablet (25 mg total) by mouth at bedtime for 14 days. Patient not taking: Reported on 05/29/2022 03/26/22 04/09/22  Bonnielee Haff, MD  thiamine (VITAMIN B1) 100 MG tablet Take 1 tablet (100 mg total) by mouth daily. Patient not taking: Reported on 05/29/2022 05/19/22   Carmin Muskrat, MD    Current Facility-Administered Medications  Medication Dose Route Frequency Provider Last Rate Last Admin   acetaminophen (TYLENOL) tablet 650 mg  650 mg Oral Q6H PRN Marcelyn Bruins, MD       Or   acetaminophen (TYLENOL) suppository 650 mg  650 mg Rectal Q6H PRN Marcelyn Bruins, MD       diltiazem (CARDIZEM CD) 24 hr capsule 240 mg  240 mg Oral Daily Marcelyn Bruins, MD       empagliflozin (JARDIANCE) tablet 10 mg  10 mg Oral Daily Marcelyn Bruins, MD       hydrALAZINE (APRESOLINE) tablet 75 mg  75 mg Oral TID Marcelyn Bruins, MD   75 mg at 05/29/22 2051   isosorbide mononitrate (IMDUR) 24 hr tablet 60 mg  60 mg Oral Daily Marcelyn Bruins, MD       oxymetazoline (AFRIN) 0.05 % nasal spray 1 spray  1 spray Each Nare Once Marcelyn Bruins, MD       polyethylene glycol (MIRALAX / GLYCOLAX) packet 17 g  17 g Oral Daily PRN Marcelyn Bruins, MD       potassium chloride (KLOR-CON) packet 40 mEq  40 mEq Oral BID Marcelyn Bruins,  MD   40 mEq at 05/29/22 2051   sodium chloride flush (NS) 0.9 % injection 3 mL  3 mL Intravenous Q12H Marcelyn Bruins, MD   3 mL at 05/29/22 2051   spironolactone (ALDACTONE) tablet 25 mg  25 mg Oral Daily Marcelyn Bruins, MD        Allergies as of 05/29/2022   (No Known Allergies)    Family History  Problem Relation Age of Onset   Heart disease Mother        had a pacemaker    Social History   Socioeconomic History   Marital status: Single    Spouse name: Not on file   Number of children: 4   Years of education: Not on file   Highest education level: High school graduate  Occupational History  Comment: Not working right now  Tobacco Use   Smoking status: Some Days    Types: Cigarettes   Smokeless tobacco: Never  Vaping Use   Vaping Use: Never used  Substance and Sexual Activity   Alcohol use: Yes    Comment: 3 times a week   Drug use: Not Currently    Types: "Crack" cocaine   Sexual activity: Yes  Other Topics Concern   Not on file  Social History Narrative   Not on file   Social Determinants of Health   Financial Resource Strain: High Risk (03/19/2022)   Overall Financial Resource Strain (CARDIA)    Difficulty of Paying Living Expenses: Hard  Food Insecurity: No Food Insecurity (03/19/2022)   Hunger Vital Sign    Worried About Running Out of Food in the Last Year: Never true    Ran Out of Food in the Last Year: Never true  Transportation Needs: No Transportation Needs (03/19/2022)   PRAPARE - Hydrologist (Medical): No    Lack of Transportation (Non-Medical): No  Physical Activity: Not on file  Stress: Not on file  Social Connections: Not on file  Intimate Partner Violence: Not on file    Review of Systems: Gen: Denies fever, sweats or chills. No weight loss.  CV: Denies chest pain, palpitations or edema. Resp: Denies cough, shortness of breath of hemoptysis.  GI: See HPI. GU : Denies urinary burning, blood in  urine, increased urinary frequency or incontinence. MS: Denies joint pain, muscles aches or weakness. Derm: Denies rash, itchiness, skin lesions or unhealing ulcers. Psych: Denies depression, anxiety, memory loss or confusion. Heme: Denies easy bruising, bleeding. Neuro:  Denies headaches, dizziness or paresthesias. Endo:  Denies any problems with DM, thyroid or adrenal function.  Physical Exam: Vital signs in last 24 hours: Temp:  [97.8 F (36.6 C)-98.6 F (37 C)] 98.1 F (36.7 C) (02/17 0607) Pulse Rate:  [80-95] 83 (02/17 0607) Resp:  [14-19] 17 (02/17 0607) BP: (106-178)/(60-97) 178/96 (02/17 0607) SpO2:  [92 %-100 %] 92 % (02/17 0607) Last BM Date : 05/28/22 General:  Alert 62 year old female in no acute distress. Head:  Normocephalic and atraumatic. Eyes:  No scleral icterus. Conjunctiva pink. Ears:  Normal auditory acuity. Nose: Dried blood to the external right nares. Mouth: No blood in the oral cavity.  Dentition intact. No ulcers or lesions.  Neck:  Supple. No lymphadenopathy or thyromegaly.  Lungs: Breath sounds clear throughout. No wheezes, rhonchi or crackles.  Heart: Regular rate and rhythm, no murmurs. Abdomen: Soft, nondistended.  Nontender.  Positive bowel sounds to all 4 quadrants. Rectal: Deferred. Musculoskeletal:  Symmetrical without gross deformities.  Pulses:  Normal pulses noted. Extremities:  Without clubbing or edema. Neurologic:  Alert and  oriented x 4. No focal deficits.  Skin:  Intact without significant lesions or rashes. Psych:  Alert and cooperative. Normal mood and affect.  Intake/Output from previous day: 02/16 0701 - 02/17 0700 In: 860 [P.O.:360; IV Piggyback:500] Out: 500 [Urine:500] Intake/Output this shift: No intake/output data recorded.  Lab Results: Recent Labs    05/29/22 1041 05/29/22 1340 05/29/22 1730 05/30/22 0257  WBC 8.3  --   --  6.4  HGB 9.5* 8.5* 8.4* 8.8*  HCT 29.2* 26.9* 26.5* 26.5*  PLT 339  --   --  328    BMET Recent Labs    05/29/22 1041 05/30/22 0257  NA 136 137  K 3.4* 3.8  CL 96* 103  CO2 28 24  GLUCOSE 128* 88  BUN 38* 21*  CREATININE 1.22* 0.92  CALCIUM 9.0 9.0   LFT Recent Labs    05/30/22 0257  PROT 6.7  ALBUMIN 3.2*  AST 23  ALT 18  ALKPHOS 57  BILITOT 0.4   PT/INR Recent Labs    05/29/22 1041  LABPROT 14.0  INR 1.1   Hepatitis Panel No results for input(s): "HEPBSAG", "HCVAB", "HEPAIGM", "HEPBIGM" in the last 72 hours.    Studies/Results: No results found.  IMPRESSION/PLAN:  62 year old female with alcohol and cocaine use disorder who presented to the ED 05/29/2022 with recurrent epistaxis and hematemesis.  Recently seen by ENT, nasal packing was removed from prior nosebleed 05/19/2022 and nasal exam was unrevealing.  No GERD symptoms or abdominal pain.  Normal LFTs, platelet count and INR.  No epistaxis or hematemesis overnight. -No recommendations for endoscopic evaluation at this time -Clear liquid diet for now -Pantoprazole 40 mg 1 p.o. daily for now -To follow-up with ENT as an outpatient -Patient counseled complete alcohol and cocaine abstinence  Acute anemia secondary to epistaxis.  Baseline Hg 15 - 17 -> 9.5 -> 8.5 -> today Hg 8.8. -Transfuse for hemoglobin less than 7 or as needed if symptomatic -Expect melenic stool following ingestion blood from epistaxis  Diastolic CHF.  Echo with LV EF 55 to 60%, mild LVH and grade 2 diastolic dysfunction per echo 08/2021.  Recommended outpatient GI follow-up for screening colonoscopy  Noralyn Pick  05/30/2022, 7:15 AM 8:36AM

## 2022-05-30 NOTE — Plan of Care (Signed)

## 2022-05-30 NOTE — Discharge Summary (Signed)
Physician Discharge Summary  Amanda [name] [id number] DOB: Apr 06, 1961 DOA: 05/29/2022  PCP: Patient, No Pcp Per  Admit date: 05/29/2022 Discharge date: 05/30/2022  Admitted From: Home Disposition: Home  Recommendations for Outpatient Follow-up:  Follow up with PCP in 1-2 weeks Refilled home diltiazem, hydralazine, Imdur, spironolactone and Jardiance. Continue to encourage cocaine cessation/abstinence  Home Health: No Equipment/Devices: None  Discharge Condition: Stable CODE STATUS: Full code Diet recommendation: Heart healthy diet  History of present illness:  Amanda [name] is a 62 year old female with past medical history significant for CKD stage IIIa, chronic diastolic congestive heart failure, essential hypertension, alcohol use disorder, cocaine abuse, history of recurrent epistasis who presented with recurrent nosebleed.  Patient reports last episode on 2/6 treated with Afrin and Rhino Rocket with ENT follow-up on 2/12.  Rhino Rocket was removed at that time and no further active bleeding; and advised to avoid intranasal drug use.  Patient reports recurrence around 3 AM on day of admission, no clear trigger.  Denies nausea/vomiting.  No further bleeding appreciated in the ED.  Patient did admit to crack cocaine use but denies snorting cocaine.  In the ED, temperature 97.8 F, HR 95, RR 17, BP 118/85, SpO2 95% on room air.  WBC 8.3, hemoglobin 9.5, platelets 339.  Sodium 136, potassium 3.4, chloride 96, CO2 28, glucose 128, BUN 38, creatinine 1.22.  FOBT positive.  UDS positive for cocaine.  GI was consulted.  TRH consulted for further evaluation management of epistaxis, possible concern for GI bleed.  Hospital course:  Symptomatic anemia Epistaxis GI bleed, ruled out Patient presenting with recurrent epistaxis, anemia with hemoglobin 9.5.  FOBT was positive.  Patient with recent episode on 2/6 treated with Afrin and Rhino Rocket which was removed on 2/12 by ENT.   Complicated by history of cocaine abuse.  Epistaxis is now controlled/stopped.  FOBT was positive in the stool but likely from ongoing posterior epistaxis and unlikely related to GI bleed.  Was seen by gastroenterology with no further recommendations as this is likely from nosebleed and not a GI source.  Patient's hemoglobin was monitored during hospitalization and remained stable, 8.8 at time of discharge.  Encouraged complete abstinence from cocaine.  Outpatient follow-up with PCP and ENT as needed.  Essential hypertension Refilled home diltiazem, hydralazine, Imdur, spironolactone  Chronic diastolic congestive heart failure, compensated TTE 2023 with LVEF 55 to [id number], grade 2 diastolic dysfunction normal RV function.  Continue Imdur, spironolactone, Jardiance.  Hypokalemia Repleted during hospitalization  CKD stage IIIa Creatinine stable, 0.92 at time of discharge.  Polysubstance use with EtOH/cocaine Encourage complete abstinence/cessation.  Discharge Diagnoses:  Principal Problem:   Symptomatic anemia Active Problems:   Cocaine abuse (HCC)   Chronic diastolic (congestive) heart failure (HCC)   Stage 3a chronic kidney disease (CKD) (HCC)   Epistaxis   HTN (hypertension)   Anemia associated with acute blood loss    Discharge Instructions  Discharge Instructions     Call MD for:  difficulty breathing, headache or visual disturbances   Complete by: As directed    Call MD for:  extreme fatigue   Complete by: As directed    Call MD for:  persistant dizziness or light-headedness   Complete by: As directed    Call MD for:  persistant nausea and vomiting   Complete by: As directed    Call MD for:  severe uncontrolled pain   Complete by: As directed    Call MD for:  temperature >100.4   Complete by:  As directed    Diet - low sodium heart healthy   Complete by: As directed    Increase activity slowly   Complete by: As directed       Allergies as of 05/30/2022   No Known  Allergies      Medication List     STOP taking these medications    QUEtiapine 25 MG tablet Commonly known as: SEROQUEL   thiamine 100 MG tablet Commonly known as: VITAMIN B1       TAKE these medications    diltiazem 240 MG 24 hr capsule Commonly known as: CARDIZEM CD Take 1 capsule (240 mg total) by mouth daily.   empagliflozin 10 MG Tabs tablet Commonly known as: JARDIANCE Take 1 tablet (10 mg total) by mouth daily.   hydrALAZINE 25 MG tablet Commonly known as: APRESOLINE Take 3 tablets (75 mg total) by mouth 3 (three) times daily.   isosorbide mononitrate 60 MG 24 hr tablet Commonly known as: IMDUR Take 1 tablet (60 mg total) by mouth daily.   Multivitamin Gummies Adult Chew Chew 2 each by mouth daily.   spironolactone 25 MG tablet Commonly known as: ALDACTONE Take 1 tablet (25 mg total) by mouth daily.        No Known Allergies  Consultations: [place] [facility]   Procedures/Studies: No results found.   Subjective: Patient seen examined bedside, resting comfortably.  No complaints.  Seen by GI with no plans for endoscopy.  Hemoglobin stable.  Ready for discharge home.  Advised to abstain from further cocaine use.  Denies headache, no dizziness, no chest pain, no palpitations, no shortness of breath, no abdominal pain, no fever/chills/night sweats, no cough/congestion, no focal weakness, no fatigue, no paresthesias.  No acute events overnight per nursing staff.  Discharge Exam: Vitals:   05/30/22 0931 05/30/22 1152  BP: (!) 175/111 (!) 162/83  Pulse: 83 88  Resp: 17 18  Temp: 98.3 F (36.8 C) (!) 97.4 F (36.3 C)  SpO2: 100% 100%   Vitals:   05/30/22 0607 05/30/22 0748 05/30/22 0931 05/30/22 1152  BP: (!) 178/96 (!) 178/94 (!) 175/111 (!) 162/83  Pulse: 83 91 83 88  Resp: 17 17 17 18  $ Temp: 98.1 F (36.7 C) 98.1 F (36.7 C) 98.3 F (36.8 C) (!) 97.4 F (36.3 C)  TempSrc:  Oral Oral Oral  SpO2: 92% 98% 100% 100%    Physical  Exam: GEN: NAD, alert and oriented x 3, chronically ill appearance, appears older than stated age HEENT: NCAT, PERRL, EOMI, sclera clear, MMM, dried blood right nare, no posterior pharyngeal blood appreciated PULM: CTAB w/o wheezes/crackles, normal respiratory effort, on room air CV: RRR w/o M/G/R GI: abd soft, NTND, NABS, no R/G/M MSK: no peripheral edema, muscle strength globally intact 5/5 bilateral upper/lower extremities NEURO: CN II-XII intact, no focal deficits, sensation to light touch intact PSYCH: normal mood/affect Integumentary: dry/intact, no rashes or wounds    The results of significant diagnostics from this hospitalization (including imaging, microbiology, ancillary and laboratory) are listed below for reference.     Microbiology: No results found for this or any previous visit (from the past 240 hour(s)).   Labs: BNP (last 3 results) Recent Labs    09/08/21 2133 03/17/22 1940  BNP 1,230.1* [id number]*   Basic Metabolic Panel: Recent Labs  Lab 05/29/22 1041 05/29/22 1129 05/30/22 0257  NA 136  --  137  K 3.4*  --  3.8  CL 96*  --  103  CO2 28  --  24  GLUCOSE 128*  --  88  BUN 38*  --  21*  CREATININE 1.22*  --  0.92  CALCIUM 9.0  --  9.0  MG  --  2.3  --    Liver Function Tests: Recent Labs  Lab 05/29/22 1041 05/30/22 0257  AST 26 23  ALT 19 18  ALKPHOS 59 57  BILITOT 0.5 0.4  PROT 7.0 6.7  ALBUMIN 3.4* 3.2*   No results for input(s): "LIPASE", "AMYLASE" in the last 168 hours. No results for input(s): "AMMONIA" in the last 168 hours. CBC: Recent Labs  Lab 05/29/22 1041 05/29/22 1340 05/29/22 1730 05/30/22 0257  WBC 8.3  --   --  6.4  NEUTROABS 5.4  --   --   --   HGB 9.5* 8.5* 8.4* 8.8*  HCT 29.2* 26.9* 26.5* 26.5*  MCV 92.1  --   --  89.5  PLT 339  --   --  328   Cardiac Enzymes: No results for input(s): "CKTOTAL", "CKMB", "CKMBINDEX", "TROPONINI" in the last 168 hours. BNP: Invalid input(s): "POCBNP" CBG: No results for  input(s): "GLUCAP" in the last 168 hours. D-Dimer No results for input(s): "DDIMER" in the last 72 hours. Hgb A1c No results for input(s): "HGBA1C" in the last 72 hours. Lipid Profile No results for input(s): "CHOL", "HDL", "LDLCALC", "TRIG", "CHOLHDL", "LDLDIRECT" in the last 72 hours. Thyroid function studies No results for input(s): "TSH", "T4TOTAL", "T3FREE", "THYROIDAB" in the last 72 hours.  Invalid input(s): "FREET3" Anemia work up No results for input(s): "VITAMINB12", "FOLATE", "FERRITIN", "TIBC", "IRON", "RETICCTPCT" in the last 72 hours. Urinalysis    Component Value Date/Time   COLORURINE STRAW (A) 09/09/2021 0015   APPEARANCEUR CLEAR 09/09/2021 0015   LABSPEC 1.008 09/09/2021 0015   PHURINE 6.0 09/09/2021 0015   GLUCOSEU NEGATIVE 09/09/2021 0015   HGBUR SMALL (A) 09/09/2021 0015   BILIRUBINUR NEGATIVE 09/09/2021 0015   BILIRUBINUR Small 10/11/2013 1200   KETONESUR NEGATIVE 09/09/2021 0015   PROTEINUR NEGATIVE 09/09/2021 0015   UROBILINOGEN 0.2 10/11/2013 1200   UROBILINOGEN 0.2 02/28/2008 1612   NITRITE NEGATIVE 09/09/2021 0015   LEUKOCYTESUR NEGATIVE 09/09/2021 0015   Sepsis Labs Recent Labs  Lab 05/29/22 1041 05/30/22 0257  WBC 8.3 6.4   Microbiology No results found for this or any previous visit (from the past 240 hour(s)).   Time coordinating discharge: Over 30 minutes  SIGNED:   Donnamarie Poag [place] DO  [facility] 05/30/2022, 1:04 PM

## 2022-06-01 ENCOUNTER — Telehealth: Payer: Self-pay

## 2022-06-01 NOTE — Telephone Encounter (Signed)
-----   Message from Yetta Flock, MD sent at 05/30/2022  9:52 AM EST ----- Regarding: outpatient colonoscopy [facility], this patient needs a screening colonoscopy at some point after her recent hospitalization.  Can be direct book in the [facility] if she is willing.  Can you help book her when she is discharged from the hospital for a pre-visit with nursing for colonoscopy, does not need to see anybody in the office.  Thanks

## 2022-06-02 NOTE — Telephone Encounter (Signed)
Attempted to reach patient. Her voicemail is full and I am unable to leave a vm at this time.

## 2022-06-04 NOTE — Telephone Encounter (Signed)
2nd attempt to reach patient. Her vm still remains full and not accepting any new messages at this time. Will attempt to reach patient at a different time.

## 2022-06-09 NOTE — Telephone Encounter (Signed)
3rd attempt to reach patient. Her vm is full and cannot accept any new messages at this time. Letter sent to patient to contact the office to schedule screening colonoscopy.

## 2022-06-26 ENCOUNTER — Telehealth: Payer: Self-pay | Admitting: General Practice

## 2022-06-26 ENCOUNTER — Ambulatory Visit: Payer: Medicaid Other | Admitting: Family Medicine

## 2022-06-26 NOTE — Telephone Encounter (Signed)
Pt was a no show for a NP app with Dr Grandville Silos on 06/26/22, she has a flat tire, I did send a no show letter.

## 2022-07-14 ENCOUNTER — Ambulatory Visit: Payer: Medicaid Other | Admitting: Family Medicine

## 2022-07-30 ENCOUNTER — Ambulatory Visit: Payer: Medicaid Other | Admitting: Family Medicine

## 2022-07-30 ENCOUNTER — Telehealth: Payer: Self-pay | Admitting: General Practice

## 2022-07-30 NOTE — Telephone Encounter (Signed)
3rd missed new pt visit, unable to reschedule at [facility]

## 2022-07-30 NOTE — Telephone Encounter (Signed)
Pt NS on 4/18 got lost.letter printed.

## 2022-09-02 ENCOUNTER — Ambulatory Visit: Payer: Self-pay | Admitting: Internal Medicine

## 2023-01-11 ENCOUNTER — Ambulatory Visit: Payer: Medicaid Other | Admitting: Internal Medicine

## 2023-04-09 IMAGING — DX DG CHEST 2V
2 series · 2 of 2 positions shown · non-contrast
Comparison: Chest two views 01/07/2016 and 07/24/2013

CLINICAL DATA: Shortness of breath for 3 weeks.

EXAM:
CHEST - 2 VIEW

[chest pa]
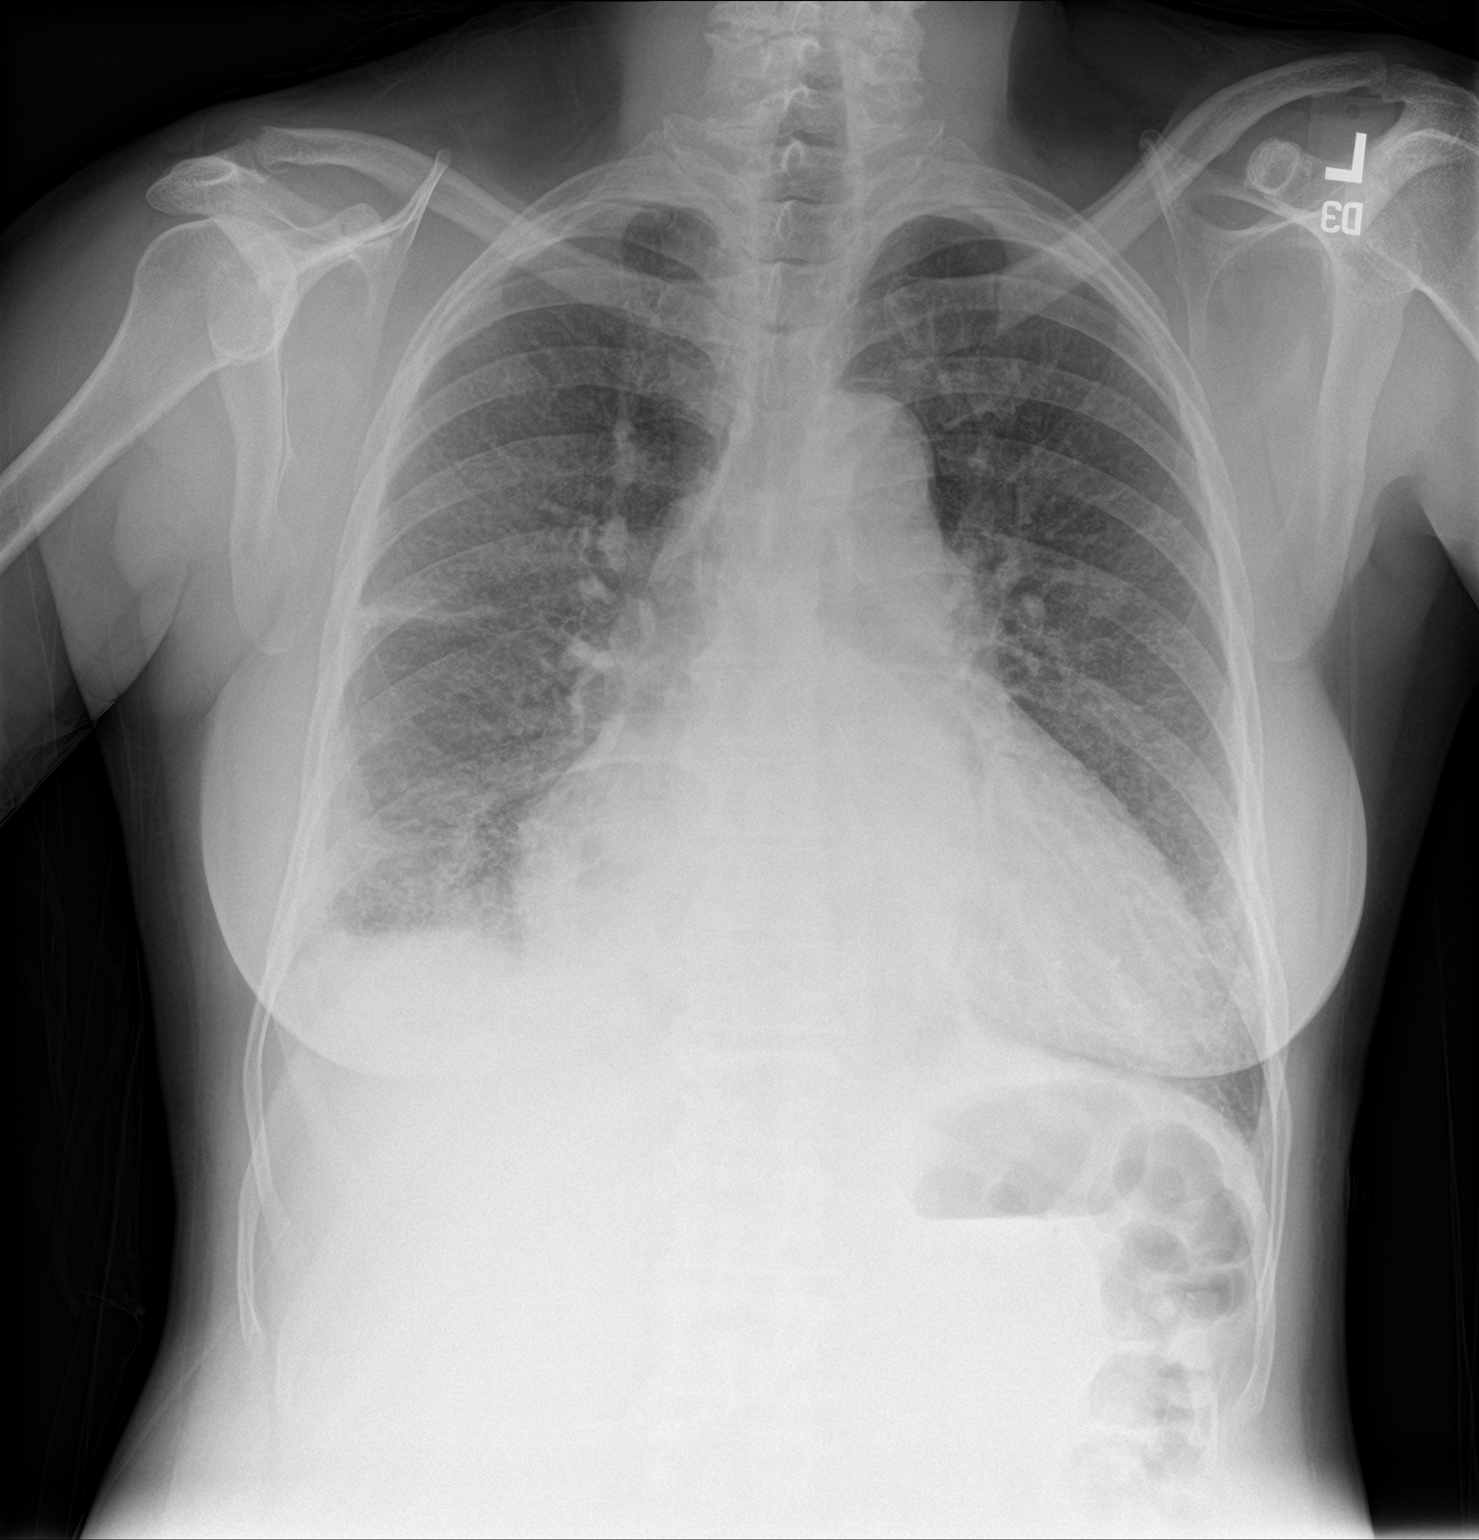

[chest lat]
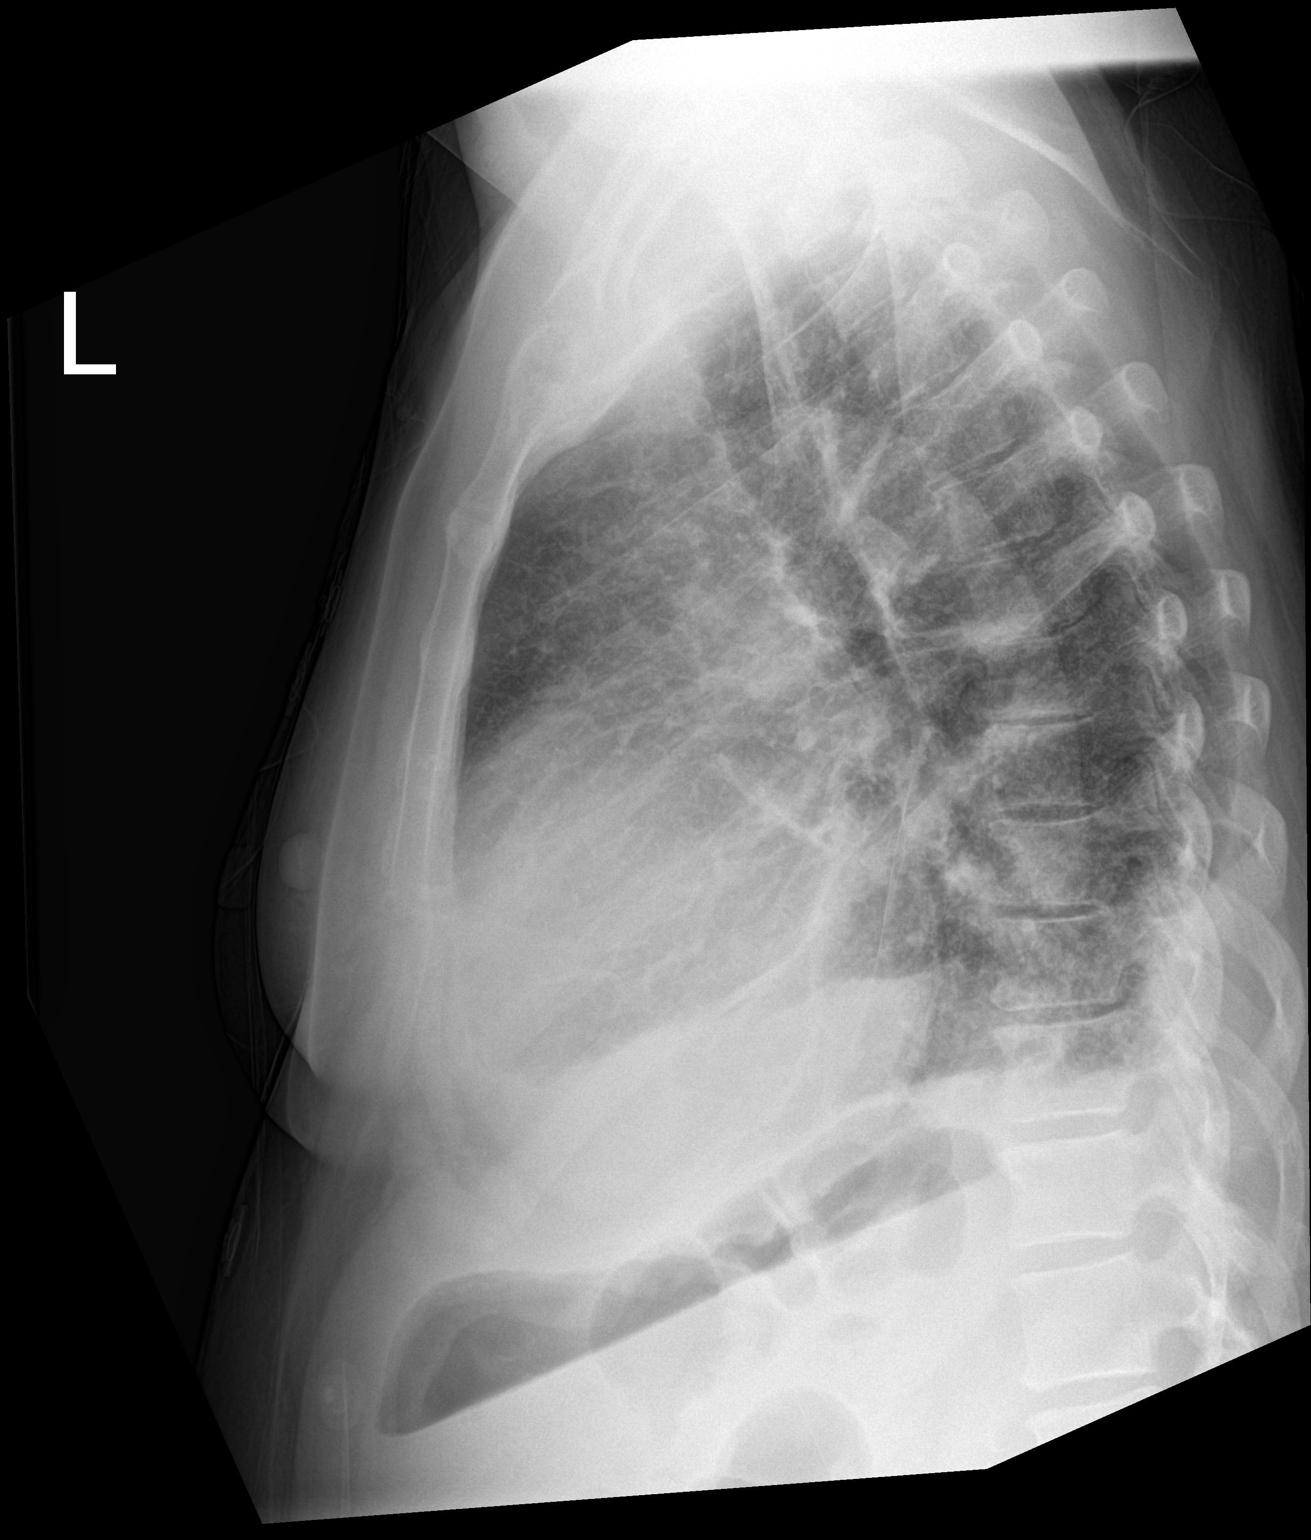

[2 of 2 positions shown; findings below may reference images not displayed]

FINDINGS: Cardiac silhouette is moderately enlarged and this appears unchanged
to mildly worsened from prior. Mediastinal contours are within
normal limits.

There is interval increase in now moderate inferior and mild mid
height of the lungs interstitial pulmonary edema compared to
01/07/2016. This is already been increased on 01/07/2016 compared to
normal 07/24/2013 radiographs. There is likely a new tiny right
pleural effusion with possible tiny left pleural effusion on lateral
view. There again appears to be increased cystic lucency within the
upper lungs suggesting cystic emphysematous changes. No pneumothorax
is seen. No acute skeletal abnormality.
IMPRESSION: 1. There is mild-to-moderate interstitial thickening greatest within
the lung bases. This is increased from 01/07/2016, which was already
increased from normal radiographs 07/24/2013. This may represent
progressive mild-to-moderate interstitial pulmonary edema. There
also appear to be chronic lucent emphysematous changes and
interstitial thickening could represent progressive interstitial
lung disease.
2. Probable trace right and possible trace left pleural effusions.

## 2023-04-10 IMAGING — CT CT ANGIO CHEST
2 of 7 series · 17 of 46 positions shown · IV contrast (APPLIED)
Comparison: Chest radiographs 09/08/2021 and earlier.

CLINICAL DATA: 60-year-old female with shortness of breath and
abdominal distension.

EXAM:
CT ANGIOGRAPHY CHEST WITH CONTRAST
TECHNIQUE: Multidetector CT imaging of the chest was performed using the
standard protocol during bolus administration of intravenous
contrast. Multiplanar CT image reconstructions and MIPs were
obtained to evaluate the vascular anatomy.

[Series 7: thins · axial · 0.61mm/px · z∈[+1176,+1423]mm · 14 of 399 slices shown]
[im 23/399  lung]
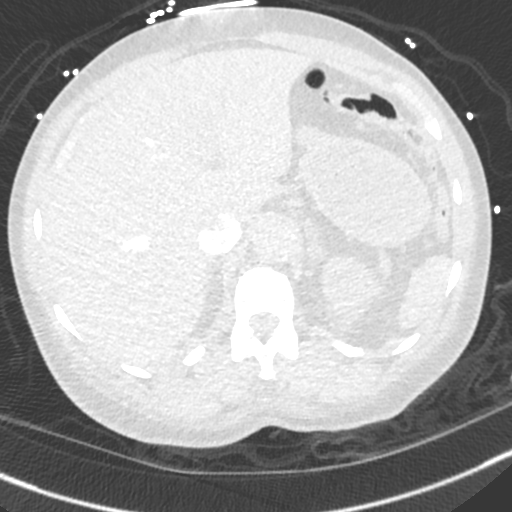
[im 45/399  soft-tissue]
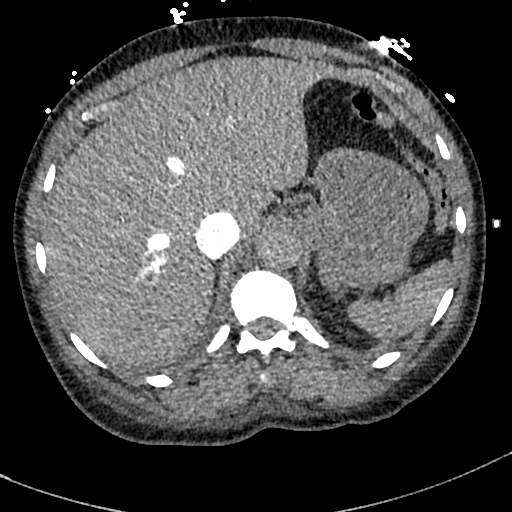
[im 89/399  lung]
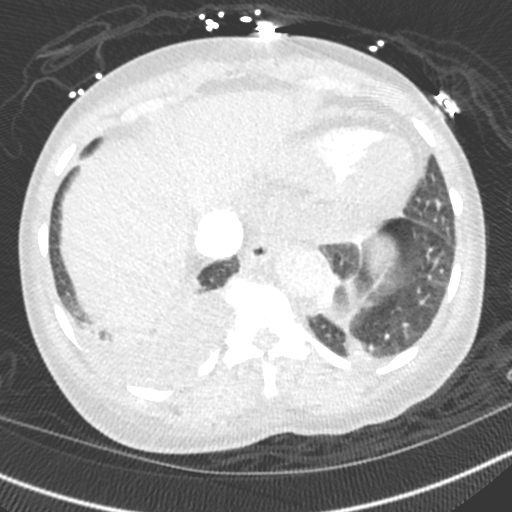
[im 111/399  soft-tissue]
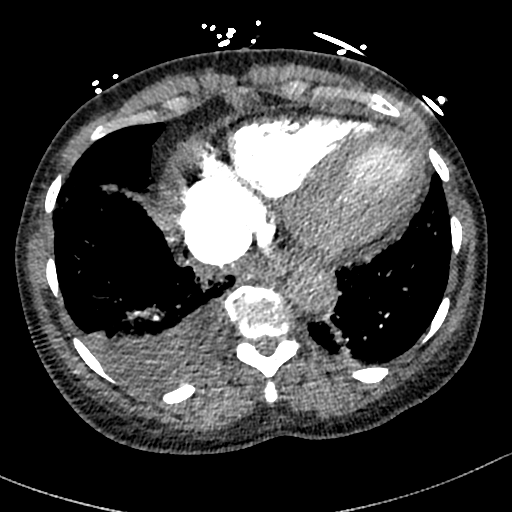
[im 133/399  lung]
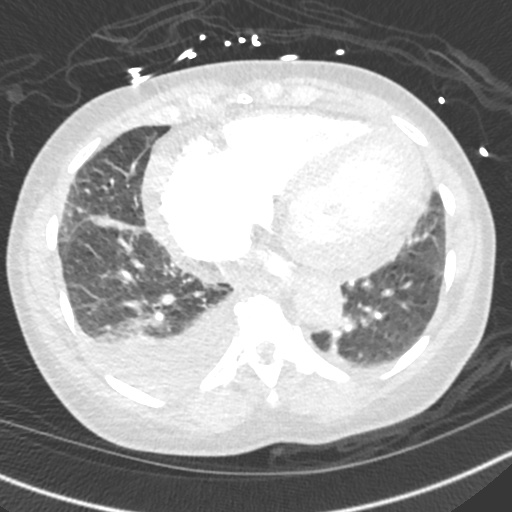
[im 155/399  soft-tissue]
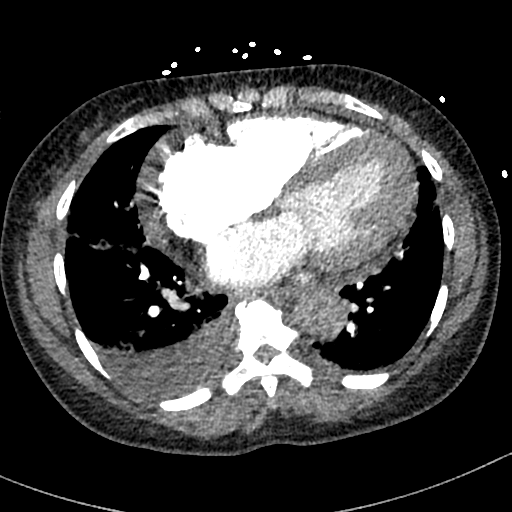
[im 177/399  lung]
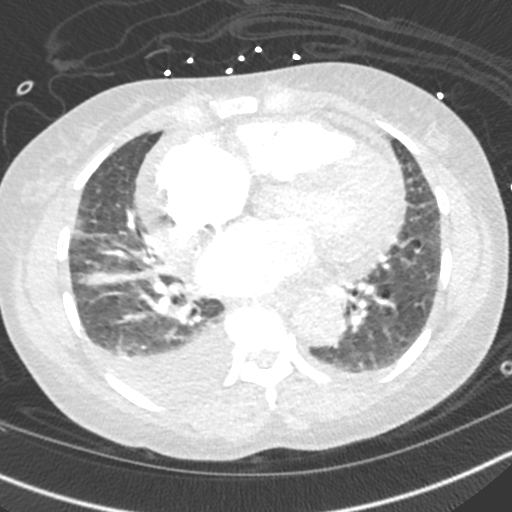
[im 222/399  soft-tissue]
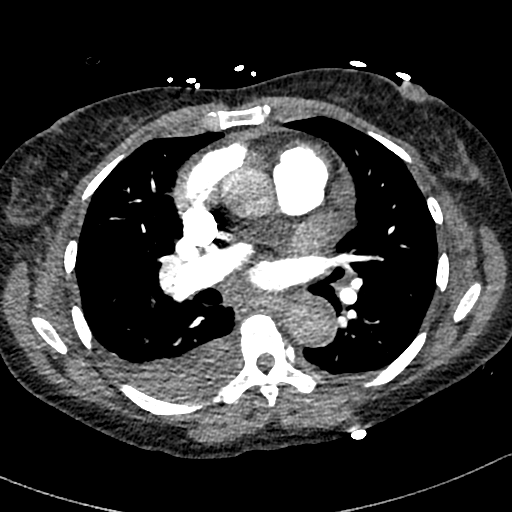
[im 244/399  lung]
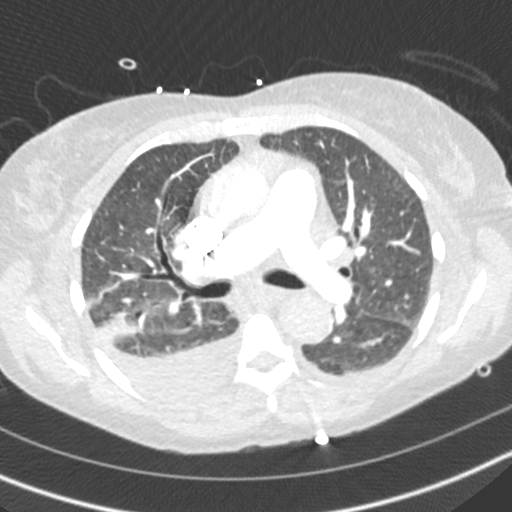
[im 266/399  soft-tissue]
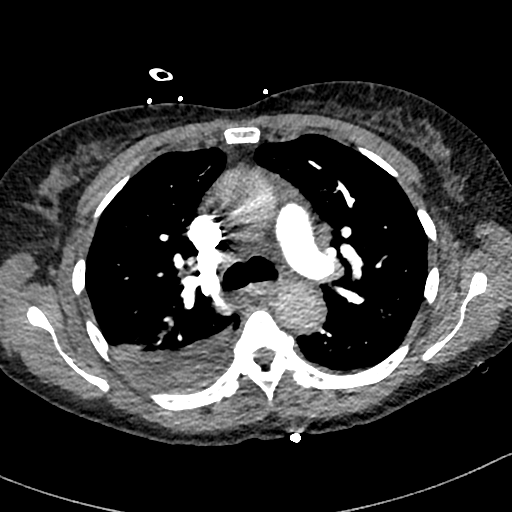
[im 288/399  lung]
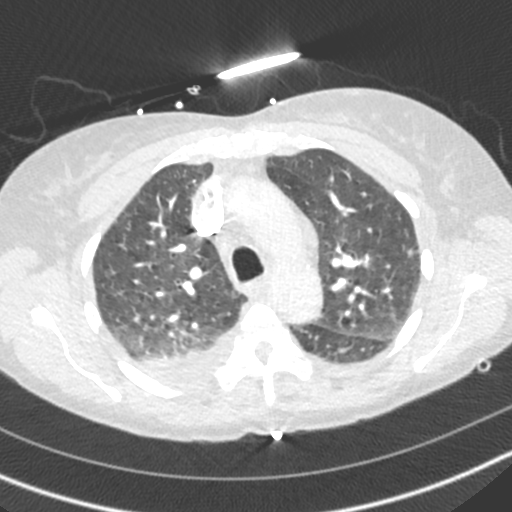
[im 310/399  soft-tissue]
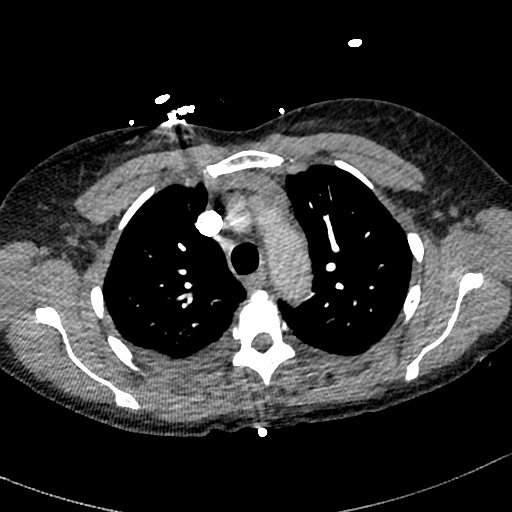
[im 354/399  lung]
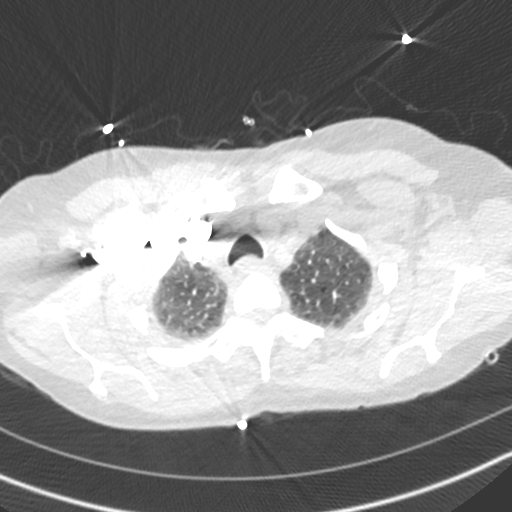
[im 376/399  soft-tissue]
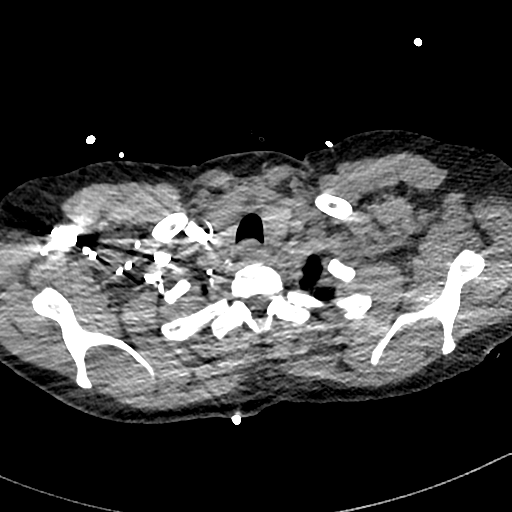

[Series 8: cor · coronal · 0.55mm/px · 3 of 134 slices shown]
[im 34/134  soft-tissue]
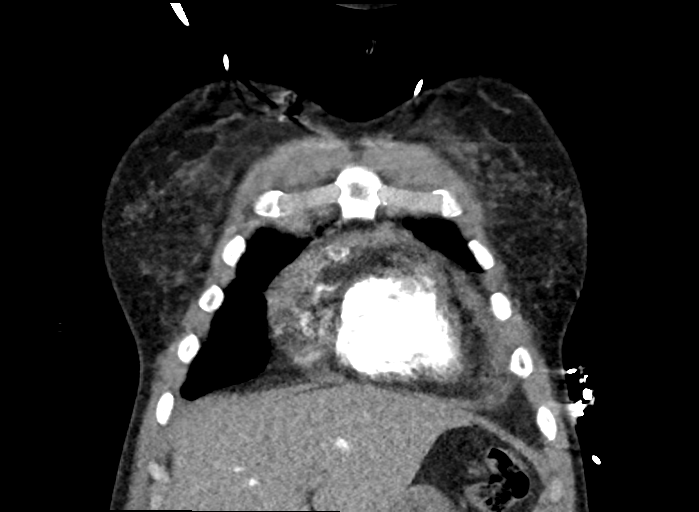
[im 67/134  soft-tissue]
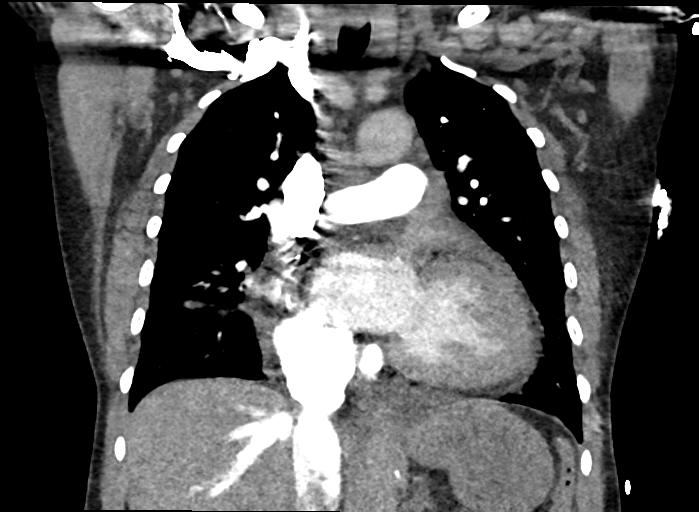
[im 100/134  soft-tissue]
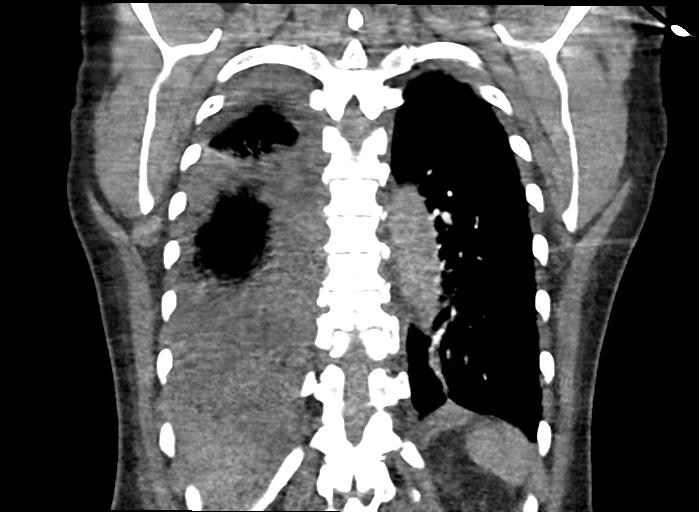

[17 of 46 positions shown; findings below may reference images not displayed]

RADIATION DOSE REDUCTION: This exam was performed according to the
departmental dose-optimization program which includes automated
exposure control, adjustment of the mA and/or kV according to
patient size and/or use of iterative reconstruction technique.

CONTRAST:  70mL OMNIPAQUE IOHEXOL 350 MG/ML SOLN
FINDINGS: Cardiovascular: Excellent contrast bolus timing in the pulmonary
arterial tree.

No focal filling defect identified in the pulmonary arteries to
suggest acute pulmonary embolism.

Cardiomegaly with small volume pericardial effusion. Contrast reflux
into the hepatic IVC and hepatic veins which appear mildly dilated.
No contrast in the aorta. Calcified coronary artery atherosclerosis
on series 5, images 66 and 67.

Mediastinum/Nodes: Negative. No mediastinal mass or lymphadenopathy.

Lungs/Pleura: Small layering right and trace layering left pleural
effusions with simple fluid density. Major airways remain patent.
There is generalized pulmonary ground-glass opacity with mild
peribronchial and dependent platelike atelectasis in the right lung.
Similar platelike atelectasis in the left lower lobe.

Upper Abdomen: Aside from contrast reflux into the hepatic veins
visible liver, spleen, adrenal glands, left kidney, and bowel in the
upper abdomen appear negative.

Musculoskeletal: No acute osseous abnormality identified.

Review of the MIP images confirms the above findings.
IMPRESSION: 1. No evidence of acute pulmonary embolism.
2. Cardiomegaly with small volume pericardial effusion and evidence
of right heart failure.
3. Generalized Pulmonary Edema suspected with a small right > left
layering pleural effusions and platelike atelectasis.
4. Calcified Coronary Artery Atherosclerosis.

## 2023-05-12 ENCOUNTER — Encounter (HOSPITAL_COMMUNITY): Payer: Self-pay

## 2023-05-12 ENCOUNTER — Other Ambulatory Visit: Payer: Self-pay

## 2023-05-12 ENCOUNTER — Observation Stay (HOSPITAL_COMMUNITY): Payer: Medicaid Other

## 2023-05-12 ENCOUNTER — Emergency Department (HOSPITAL_COMMUNITY): Payer: Medicaid Other

## 2023-05-12 ENCOUNTER — Inpatient Hospital Stay (HOSPITAL_COMMUNITY)
Admission: EM | Admit: 2023-05-12 | Discharge: 2023-05-14 | DRG: 917 | Disposition: A | Discharge status: Home / Self Care | Payer: Medicaid Other | Attending: Internal Medicine | Admitting: Internal Medicine

## 2023-05-12 DIAGNOSIS — I161 Hypertensive emergency: Secondary | ICD-10-CM | POA: Diagnosis present

## 2023-05-12 DIAGNOSIS — R7989 Other specified abnormal findings of blood chemistry: Secondary | ICD-10-CM

## 2023-05-12 DIAGNOSIS — I5042 Chronic combined systolic (congestive) and diastolic (congestive) heart failure: Secondary | ICD-10-CM | POA: Diagnosis present

## 2023-05-12 DIAGNOSIS — E1122 Type 2 diabetes mellitus with diabetic chronic kidney disease: Secondary | ICD-10-CM | POA: Diagnosis present

## 2023-05-12 DIAGNOSIS — I361 Nonrheumatic tricuspid (valve) insufficiency: Secondary | ICD-10-CM

## 2023-05-12 DIAGNOSIS — N1831 Chronic kidney disease, stage 3a: Secondary | ICD-10-CM | POA: Diagnosis present

## 2023-05-12 DIAGNOSIS — F1410 Cocaine abuse, uncomplicated: Secondary | ICD-10-CM | POA: Diagnosis present

## 2023-05-12 DIAGNOSIS — D751 Secondary polycythemia: Secondary | ICD-10-CM | POA: Diagnosis present

## 2023-05-12 DIAGNOSIS — Z8249 Family history of ischemic heart disease and other diseases of the circulatory system: Secondary | ICD-10-CM

## 2023-05-12 DIAGNOSIS — I5082 Biventricular heart failure: Secondary | ICD-10-CM | POA: Diagnosis present

## 2023-05-12 DIAGNOSIS — I130 Hypertensive heart and chronic kidney disease with heart failure and stage 1 through stage 4 chronic kidney disease, or unspecified chronic kidney disease: Secondary | ICD-10-CM | POA: Diagnosis present

## 2023-05-12 DIAGNOSIS — I429 Cardiomyopathy, unspecified: Secondary | ICD-10-CM | POA: Diagnosis present

## 2023-05-12 DIAGNOSIS — Z7984 Long term (current) use of oral hypoglycemic drugs: Secondary | ICD-10-CM

## 2023-05-12 DIAGNOSIS — I10 Essential (primary) hypertension: Secondary | ICD-10-CM | POA: Diagnosis present

## 2023-05-12 DIAGNOSIS — F17210 Nicotine dependence, cigarettes, uncomplicated: Secondary | ICD-10-CM | POA: Diagnosis present

## 2023-05-12 DIAGNOSIS — D649 Anemia, unspecified: Secondary | ICD-10-CM | POA: Diagnosis present

## 2023-05-12 DIAGNOSIS — I5043 Acute on chronic combined systolic (congestive) and diastolic (congestive) heart failure: Secondary | ICD-10-CM | POA: Diagnosis present

## 2023-05-12 DIAGNOSIS — I160 Hypertensive urgency: Principal | ICD-10-CM

## 2023-05-12 DIAGNOSIS — Z91148 Patient's other noncompliance with medication regimen for other reason: Secondary | ICD-10-CM

## 2023-05-12 DIAGNOSIS — Z79899 Other long term (current) drug therapy: Secondary | ICD-10-CM

## 2023-05-12 DIAGNOSIS — T405X1A Poisoning by cocaine, accidental (unintentional), initial encounter: Principal | ICD-10-CM | POA: Diagnosis present

## 2023-05-12 DIAGNOSIS — J810 Acute pulmonary edema: Secondary | ICD-10-CM | POA: Diagnosis present

## 2023-05-12 HISTORY — DX: Type 2 diabetes mellitus without complications: E11.9

## 2023-05-12 HISTORY — DX: Stress incontinence (female) (male): N39.3

## 2023-05-12 HISTORY — DX: Chronic kidney disease, unspecified: N18.9

## 2023-05-12 HISTORY — DX: Heart failure, unspecified: I50.9

## 2023-05-12 HISTORY — DX: Chronic cough: R05.3

## 2023-05-12 LAB — CBC WITH DIFFERENTIAL/PLATELET
Abs Immature Granulocytes: 0.04 10*3/uL (ref 0.00–0.07)
Basophils Absolute: 0 10*3/uL (ref 0.0–0.1)
Basophils Relative: 1 %
Eosinophils Absolute: 0 10*3/uL (ref 0.0–0.5)
Eosinophils Relative: 1 %
HCT: 44.9 % (ref 36.0–46.0)
Hemoglobin: 15.2 g/dL — ABNORMAL HIGH (ref 12.0–15.0)
Immature Granulocytes: 1 %
Lymphocytes Relative: 28 %
Lymphs Abs: 1.7 10*3/uL (ref 0.7–4.0)
MCH: 30.5 pg (ref 26.0–34.0)
MCHC: 33.9 g/dL (ref 30.0–36.0)
MCV: 90 fL (ref 80.0–100.0)
Monocytes Absolute: 0.6 10*3/uL (ref 0.1–1.0)
Monocytes Relative: 9 %
Neutro Abs: 3.8 10*3/uL (ref 1.7–7.7)
Neutrophils Relative %: 60 %
Platelets: 306 10*3/uL (ref 150–400)
RBC: 4.99 MIL/uL (ref 3.87–5.11)
RDW: 15.4 % (ref 11.5–15.5)
WBC: 6.1 10*3/uL (ref 4.0–10.5)
nRBC: 0 % (ref 0.0–0.2)

## 2023-05-12 LAB — COMPREHENSIVE METABOLIC PANEL
ALT: 49 U/L — ABNORMAL HIGH (ref 0–44)
AST: 41 U/L (ref 15–41)
Albumin: 2.9 g/dL — ABNORMAL LOW (ref 3.5–5.0)
Alkaline Phosphatase: 162 U/L — ABNORMAL HIGH (ref 38–126)
Anion gap: 14 (ref 5–15)
BUN: 20 mg/dL (ref 8–23)
CO2: 23 mmol/L (ref 22–32)
Calcium: 9.1 mg/dL (ref 8.9–10.3)
Chloride: 102 mmol/L (ref 98–111)
Creatinine, Ser: 1.02 mg/dL — ABNORMAL HIGH (ref 0.44–1.00)
GFR, Estimated: >60 mL/min (ref >60)
Glucose, Bld: 96 mg/dL (ref 70–99)
Potassium: 3.5 mmol/L (ref 3.5–5.1)
Sodium: 139 mmol/L (ref 135–145)
Total Bilirubin: 0.8 mg/dL (ref 0.0–1.2)
Total Protein: 6.4 g/dL — ABNORMAL LOW (ref 6.5–8.1)

## 2023-05-12 LAB — ECHOCARDIOGRAM COMPLETE
Area-P 1/2: 5.43 cm2
Calc EF: 30.7 %
Est EF: 30
Height: 64 in
S' Lateral: 3.6 cm
Single Plane A2C EF: 28 %
Single Plane A4C EF: 27.2 %
Weight: 2320 [oz_av]

## 2023-05-12 LAB — MAGNESIUM: Magnesium: 1.8 mg/dL (ref 1.7–2.4)

## 2023-05-12 LAB — RAPID URINE DRUG SCREEN, HOSP PERFORMED
Amphetamines: NOT DETECTED
Barbiturates: NOT DETECTED
Benzodiazepines: NOT DETECTED
Cocaine: POSITIVE — AB
Opiates: NOT DETECTED
Tetrahydrocannabinol: NOT DETECTED

## 2023-05-12 LAB — HEMOGLOBIN A1C
Hgb A1c MFr Bld: 7.2 % — ABNORMAL HIGH (ref 4.8–5.6)
Mean Plasma Glucose: 159.94 mg/dL

## 2023-05-12 LAB — TROPONIN I (HIGH SENSITIVITY)
Troponin I (High Sensitivity): 51 ng/L — ABNORMAL HIGH (ref <18)
Troponin I (High Sensitivity): 47 ng/L — ABNORMAL HIGH (ref <18)

## 2023-05-12 LAB — CBG MONITORING, ED
Glucose-Capillary: 180 mg/dL — ABNORMAL HIGH (ref 70–99)
Glucose-Capillary: 127 mg/dL — ABNORMAL HIGH (ref 70–99)

## 2023-05-12 LAB — HIV ANTIBODY (ROUTINE TESTING W REFLEX): HIV Screen 4th Generation wRfx: NONREACTIVE

## 2023-05-12 LAB — BRAIN NATRIURETIC PEPTIDE: B Natriuretic Peptide: 1407.8 pg/mL — ABNORMAL HIGH (ref 0.0–100.0)

## 2023-05-12 LAB — GLUCOSE, CAPILLARY: Glucose-Capillary: 106 mg/dL — ABNORMAL HIGH (ref 70–99)

## 2023-05-12 MED ORDER — ALBUTEROL SULFATE (2.5 MG/3ML) 0.083% IN NEBU: 3.0000 mL | INHALATION_SOLUTION | Freq: Four times a day (QID) | RESPIRATORY_TRACT | Status: DC | PRN

## 2023-05-12 MED ORDER — SPIRONOLACTONE 25 MG PO TABS
25.0000 mg | ORAL_TABLET | Freq: Every day | ORAL | Status: DC
  Administered 2023-05-13 – 2023-05-14 (×2): 25 mg via ORAL
  Filled 2023-05-12 (×2): qty 1

## 2023-05-12 MED ORDER — DILTIAZEM HCL ER COATED BEADS 240 MG PO CP24
240.0000 mg | ORAL_CAPSULE | Freq: Every day | ORAL | Status: DC
  Filled 2023-05-12: qty 1

## 2023-05-12 MED ORDER — HYDRALAZINE HCL 50 MG PO TABS
75.0000 mg | ORAL_TABLET | Freq: Three times a day (TID) | ORAL | Status: DC
  Administered 2023-05-12 – 2023-05-14 (×6): 75 mg via ORAL
  Filled 2023-05-12 (×6): qty 1

## 2023-05-12 MED ORDER — BENZONATATE 100 MG PO CAPS
200.0000 mg | ORAL_CAPSULE | Freq: Three times a day (TID) | ORAL | Status: DC | PRN
  Administered 2023-05-13 (×2): 200 mg via ORAL
  Filled 2023-05-12 (×2): qty 2

## 2023-05-12 MED ORDER — FUROSEMIDE 10 MG/ML IJ SOLN
40.0000 mg | Freq: Once | INTRAMUSCULAR | Status: AC
Start: 2023-05-12 — End: 2023-05-12
  Administered 2023-05-12: 40 mg via INTRAVENOUS
  Filled 2023-05-12: qty 4

## 2023-05-12 MED ORDER — LABETALOL HCL 5 MG/ML IV SOLN
10.0000 mg | INTRAVENOUS | Status: DC | PRN
  Administered 2023-05-12 – 2023-05-13 (×3): 10 mg via INTRAVENOUS
  Filled 2023-05-12 (×3): qty 4

## 2023-05-12 MED ORDER — DILTIAZEM HCL 60 MG PO TABS
60.0000 mg | ORAL_TABLET | Freq: Once | ORAL | Status: AC
  Administered 2023-05-12: 60 mg via ORAL
  Filled 2023-05-12: qty 1

## 2023-05-12 MED ORDER — EMPAGLIFLOZIN 10 MG PO TABS
10.0000 mg | ORAL_TABLET | Freq: Every day | ORAL | Status: DC
  Administered 2023-05-12 – 2023-05-14 (×3): 10 mg via ORAL
  Filled 2023-05-12 (×3): qty 1

## 2023-05-12 MED ORDER — POTASSIUM CHLORIDE CRYS ER 20 MEQ PO TBCR
40.0000 meq | EXTENDED_RELEASE_TABLET | Freq: Once | ORAL | Status: AC
  Administered 2023-05-12: 40 meq via ORAL
  Filled 2023-05-12: qty 2

## 2023-05-12 MED ORDER — ASPIRIN 81 MG PO CHEW
324.0000 mg | CHEWABLE_TABLET | Freq: Once | ORAL | Status: AC
  Administered 2023-05-12: 324 mg via ORAL
  Filled 2023-05-12: qty 4

## 2023-05-12 MED ORDER — LABETALOL HCL 5 MG/ML IV SOLN: 20.0000 mg | Freq: Once | INTRAVENOUS | Status: DC

## 2023-05-12 MED ORDER — ENOXAPARIN SODIUM 40 MG/0.4ML IJ SOSY
40.0000 mg | PREFILLED_SYRINGE | Freq: Every day | INTRAMUSCULAR | Status: DC
  Administered 2023-05-12 – 2023-05-14 (×2): 40 mg via SUBCUTANEOUS
  Filled 2023-05-12 (×3): qty 0.4

## 2023-05-12 MED ORDER — ADULT MULTIVITAMIN W/MINERALS CH
1.0000 | ORAL_TABLET | Freq: Every day | ORAL | Status: DC
  Administered 2023-05-12 – 2023-05-14 (×3): 1 via ORAL
  Filled 2023-05-12 (×3): qty 1

## 2023-05-12 MED ORDER — NITROGLYCERIN 2 % TD OINT
1.0000 [in_us] | TOPICAL_OINTMENT | Freq: Once | TRANSDERMAL | Status: AC
  Administered 2023-05-12: 1 [in_us] via TOPICAL
  Filled 2023-05-12: qty 1

## 2023-05-12 MED ORDER — INSULIN ASPART 100 UNIT/ML IJ SOLN
0.0000 [IU] | Freq: Three times a day (TID) | INTRAMUSCULAR | Status: DC
  Administered 2023-05-12: 2 [IU] via SUBCUTANEOUS
  Administered 2023-05-12: 3 [IU] via SUBCUTANEOUS
  Administered 2023-05-13: 2 [IU] via SUBCUTANEOUS
  Administered 2023-05-14: 5 [IU] via SUBCUTANEOUS

## 2023-05-12 MED ORDER — FUROSEMIDE 10 MG/ML IJ SOLN
40.0000 mg | Freq: Once | INTRAMUSCULAR | Status: AC
  Administered 2023-05-12: 40 mg via INTRAVENOUS
  Filled 2023-05-12: qty 4

## 2023-05-12 MED ORDER — SPIRONOLACTONE 12.5 MG HALF TABLET
25.0000 mg | ORAL_TABLET | Freq: Every day | ORAL | Status: DC
  Administered 2023-05-12: 25 mg via ORAL
  Filled 2023-05-12: qty 2

## 2023-05-12 MED ORDER — HYDRALAZINE HCL 25 MG PO TABS
25.0000 mg | ORAL_TABLET | Freq: Once | ORAL | Status: AC
  Administered 2023-05-12: 25 mg via ORAL
  Filled 2023-05-12: qty 1

## 2023-05-12 NOTE — ED Triage Notes (Signed)
On and off for 4 days. Patient also states productive cough. Patient hypertensive, has not taken BP meds in 2 weeks

## 2023-05-12 NOTE — ED Notes (Signed)
Report called in to 2W. Receiving RN ready to receive PT.

## 2023-05-12 NOTE — ED Provider Notes (Signed)
[place] EMERGENCY DEPARTMENT AT [facility] Provider Note   CSN: [id number] Arrival date & time: 05/12/23  7253     History  Chief Complaint  Patient presents with   Chest Pain    Amanda [name] is a 63 y.o. female.  The history is provided by the patient.  Chest Pain She has history of hypertension, chronic kidney disease, diastolic heart failure, substance abuse and comes in with 4-day history of cough and shortness of breath.  Yesterday, she noted that her legs were swelling.  Today, she noted that she would have some discomfort in her chest if she walked too far.  She does endorse a subjective fever.  She denies chills, nausea, vomiting, diaphoresis.  She does admit to having used cocaine and marijuana 4 days ago.  She states that she ran out of all of her medication 2 weeks ago.   Home Medications Prior to Admission medications   Medication Sig Start Date End Date Taking? Authorizing Provider  diltiazem (CARDIZEM CD) 240 MG 24 hr capsule Take 1 capsule (240 mg total) by mouth daily. 05/30/22 08/28/22  [place], Alvira Philips, DO  hydrALAZINE (APRESOLINE) 25 MG tablet Take 3 tablets (75 mg total) by mouth 3 (three) times daily. 05/30/22 08/28/22  [place], Alvira Philips, DO  isosorbide mononitrate (IMDUR) 60 MG 24 hr tablet Take 1 tablet (60 mg total) by mouth daily. 05/30/22 08/28/22  [place], Eric J, DO  Multiple Vitamins-Minerals (MULTIVITAMIN GUMMIES ADULT) CHEW Chew 2 each by mouth daily.    [provider]  spironolactone (ALDACTONE) 25 MG tablet Take 1 tablet (25 mg total) by mouth daily. 05/30/22 08/28/22  [place], Eric J, DO      Allergies    Patient has no known allergies.    Review of Systems   Review of Systems  Cardiovascular:  Positive for chest pain.  All other systems reviewed and are negative.   Physical Exam Updated Vital Signs BP (!) 187/128 (BP Location: Right Arm)   Pulse 94   Temp 98.3 F (36.8 C) (Oral)   Resp 20   Ht 5\' 4"  (1.626 m)   Wt 65.8  kg   LMP 12/24/2015 (Within Days)   SpO2 100%   BMI 24.89 kg/m  Physical Exam Vitals and nursing note reviewed.   63 year old female, resting comfortably and in no acute distress. Vital signs are significant for markedly elevated blood pressure. Oxygen saturation is 100%, which is normal. Head is normocephalic and atraumatic. PERRLA, EOMI. Oropharynx is clear. Neck is nontender and supple without adenopathy.  JVD is present at 90 degrees. Back is nontender and there is no CVA tenderness.  There is 2+ presacral edema. Lungs have bibasilar rales about halfway up with coarse expiratory breath sounds but no overt wheezes or rhonchi. Chest is nontender. Heart has regular rate and rhythm without murmur. Abdomen is soft, flat, nontender. Extremities have 1+ edema, full range of motion is present. Skin is warm and dry without rash. Neurologic: Mental status is normal, cranial nerves are intact, moves all extremities equally.  ED Results / Procedures / Treatments   Labs (all labs ordered are listed, but only abnormal results are displayed) Labs Reviewed  CBC WITH DIFFERENTIAL/PLATELET - Abnormal; Notable for the following components:      Result Value   Hemoglobin 15.2 (*)    All other components within normal limits  BRAIN NATRIURETIC PEPTIDE - Abnormal; Notable for the following components:   B Natriuretic Peptide 1,407.8 (*)  All other components within normal limits  RAPID URINE DRUG SCREEN, HOSP PERFORMED - Abnormal; Notable for the following components:   Cocaine POSITIVE (*)    All other components within normal limits  TROPONIN I (HIGH SENSITIVITY) - Abnormal; Notable for the following components:   Troponin I (High Sensitivity) 51 (*)    All other components within normal limits  RESP PANEL BY RT-PCR (RSV, FLU A&B, COVID)  [id number]  COMPREHENSIVE METABOLIC PANEL  TROPONIN I (HIGH SENSITIVITY)    EKG EKG Interpretation Date/Time:  Wednesday May 12 2023 04:20:06  EST Ventricular Rate:  93 PR Interval:  150 QRS Duration:  82 QT Interval:  412 QTC Calculation: 512 R Axis:   73  Text Interpretation: Normal sinus rhythm Biatrial enlargement Left ventricular hypertrophy with repolarization abnormality ( Sokolow-Lyon , Romhilt-Estes ) Prolonged QT Abnormal ECG When compared with ECG of 17-Mar-2022 19:31, No significant change was found Confirmed by Dione Booze ([id number]) on 05/12/2023 4:30:42 AM  Radiology DG Chest Port 1 View Result Date: 05/12/2023 CLINICAL DATA:  63 year old female with history of hypertension, cough, chest pain and shortness of breath for the past 4 days. EXAM: PORTABLE CHEST 1 VIEW COMPARISON:  Chest x-ray 03/22/2022. FINDINGS: Lung volumes are normal. No consolidative airspace disease. No pleural effusions. No pneumothorax. No evidence of pulmonary edema. Moderate cardiomegaly. Upper mediastinal contours are within normal limits. Atherosclerotic calcifications are noted in the thoracic aorta. IMPRESSION: 1. Moderate cardiomegaly. Electronically Signed   By: Trudie Reed M.D.   On: 05/12/2023 05:03    Procedures Procedures  Cardiac monitor shows normal sinus rhythm, per my interpretation.  Medications Ordered in ED Medications  spironolactone (ALDACTONE) tablet 25 mg (25 mg Oral Given 05/12/23 0438)  labetalol (NORMODYNE) injection 20 mg (has no administration in time range)  aspirin chewable tablet 324 mg (324 mg Oral Given 05/12/23 0435)  diltiazem (CARDIZEM) tablet 60 mg (60 mg Oral Given 05/12/23 0436)  hydrALAZINE (APRESOLINE) tablet 25 mg (25 mg Oral Given 05/12/23 0436)  nitroGLYCERIN (NITROGLYN) 2 % ointment 1 inch (1 inch Topical Given 05/12/23 0436)  furosemide (LASIX) injection 40 mg (40 mg Intravenous Given 05/12/23 0437)    ED Course/ Medical Decision Making/ A&P                                 Medical Decision Making Amount and/or Complexity of Data Reviewed Labs: ordered. Radiology: ordered.  Risk OTC  drugs. Prescription drug management. Decision regarding hospitalization.   Respiratory tract infection which is likely viral.  Consider influenza, COVID-19, RSV, other viral infections.  Possible pneumonia.  Rales and peripheral edema and JVD strongly suggestive of heart failure exacerbation.  Chest discomfort, consider ACS.  Severe hypertension secondary to medication noncompliance and also aggravated by illicit drug use.  I have ordered doses of all of his medications except I substituted topical nitroglycerin for Imdur).  I have ordered a dose of furosemide and also aspirin.  I have ordered workup of chest x-ray, electrocardiogram, CBC, comprehensive metabolic panel, troponin, BNP as well as urine drug screen.  I have reviewed her past records, and noted echocardiogram on 09/09/2021 with ejection fraction of 55-60%, grade 2 diastolic dysfunction.  I note hospitalization 03/17/2022-03/26/2022 for hypertensive urgency and acute on chronic diastolic heart failure.  I have reviewed her electrocardiogram, and my interpretation is left ventricular hypertrophy with secondary repolarization changes but not significantly changed from prior.  I have reviewed her laboratory tests,  and my interpretation is elevated initial troponin which is in a range that she has been at in the past with repeat troponin pending.  Markedly elevated BNP suggesting heart failure, mild polycythemia which is new, metabolic panel pending.  Chest x-ray shows cardiomegaly but no evidence of pulmonary edema.  I have independently viewed the image, and agree with radiologist's interpretation.  Following above-noted treatment, patient's blood pressure transiently came down to 160 systolic, but then went back up to 200 systolic with diastolic blood pressures of approximately 110.  She continues to have chest discomfort, I do not feel she is safe for discharge.  I have ordered a dose of labetalol for her blood pressure.  I have paged provider for  unassigned medicine with return call pending.  CRITICAL CARE Performed by: Dione Booze Total critical care time: 50 minutes Critical care time was exclusive of separately billable procedures and treating other patients. Critical care was necessary to treat or prevent imminent or life-threatening deterioration. Critical care was time spent personally by me on the following activities: development of treatment plan with patient and/or surrogate as well as nursing, discussions with consultants, evaluation of patient's response to treatment, examination of patient, obtaining history from patient or surrogate, ordering and performing treatments and interventions, ordering and review of laboratory studies, ordering and review of radiographic studies, pulse oximetry and re-evaluation of patient's condition.  Final Clinical Impression(s) / ED Diagnoses Final diagnoses:  Hypertensive urgency  Elevated troponin  Elevated brain natriuretic peptide (BNP) level    Rx / DC Orders ED Discharge Orders     None         Dione Booze, MD 05/12/23 [phone]

## 2023-05-12 NOTE — Progress Notes (Signed)
  Echocardiogram 2D Echocardiogram has been performed.  Janalyn Harder 05/12/2023, 3:09 PM

## 2023-05-12 NOTE — H&P (Signed)
Date: 05/12/2023               Patient Name:  Amanda [name] MRN: [id number]  DOB: Jun 13, 1960 Age / Sex: 63 y.o., female   PCP: Patient, No Pcp Per         Medical Service: Internal Medicine Teaching Service         Attending Physician: Dr. Reymundo Poll, MD    First Contact: Dr. Monna Fam Pager: [phone]  Second Contact: Dr. Rana Snare Pager: [phone]       After Hours (After 5p/  First Contact Pager: [phone]  weekends / holidays): Second Contact Pager: [phone]   Chief Complaint: chest pain  History of Present Illness: Amanda [name] is a 63 y.o. F with PMH of HFpEF (LV EF 55-60%), HTN, T2DM, chronic cough, polysubstance abuse who presented to [facility] ED with chest pain and dyspnea admitted with severe asymptomatic hypertension and acute CHF exacerbation.  She explains that 4 days ago she noticed worsening of her cough and shortness of breath, with swelling in her feet starting about 2 days ago with exertional chest discomfort beginning 1 day ago. She says that she has had chest discomfort and palpitations "for a while" but it has progressed more recently. She also struggles with sleeping at night due to trouble breathing, especially when it's too warm. She does not sleep with her head elevated. She has not had weight gain but has had bilateral foot edema over the last several days. She had a headache a few nights ago but it has not recurred since. She denies abnormal vision.  She endorses a cough that began about 2 weeks ago and that last night was the first night without significant cough burden. Exertional dyspnea began about 1 month ago, and she can't walk more than down her driveway and back before she is limited by shortness of breath.  She hasn't taken her blood pressure medications in 2 weeks. She said she ran out of medication and has not established with her new doctor yet so was unable to get additional medications. She thinks that her symptom timing may have some  correlation with when she ran out of medications.  Her last use of THC and cocaine was 3 days ago. She does not think her symptoms worsened after use.  Pertinent labs include ALT 49, alkaline phosphatase 162; Hgb 15.2; BNP 1407.8; troponin 51 > 47; UDS positive for cocaine; respiratory panel pending. Given doses of home anti-hypertensives by EDP, as well as IV lasix 40 mg x1 with subjectively good urine output. Dose of IV labetalol 20 mg ordered by EDP but not given. Remains hypertensive in spite of the above efforts. IMTS paged for admission for hypertensive urgency.  Past Medical History: Past Medical History:  Diagnosis Date   Chronic cough    Chronic kidney disease    Congestive heart failure (CHF) (HCC)    Elevated troponin 09/08/2021   Hypertension    Pulmonary edema 09/08/2021   Stress incontinence    Type 2 diabetes mellitus (HCC)    Past Surgical History: Past Surgical History:  Procedure Laterality Date   I & D EXTREMITY Right 01/21/2014   Procedure: IRRIGATION AND DEBRIDEMENT RIGHT INDEX FINGER;  Surgeon: Betha Loa, MD;  Location: [facility] OR;  Service: Orthopedics;  Laterality: Right;   Meds: Additionally reports taking amlodipine 10 mg daily and losartan 50 mg daily. Current Meds  Medication Sig   diltiazem (CARDIZEM CD) 240 MG 24 hr capsule Take  1 capsule (240 mg total) by mouth daily.   hydrALAZINE (APRESOLINE) 25 MG tablet Take 3 tablets (75 mg total) by mouth 3 (three) times daily.   JARDIANCE 10 MG TABS tablet Take 10 mg by mouth daily.   Multiple Vitamins-Minerals (MULTIVITAMIN GUMMIES ADULT) CHEW Chew 2 each by mouth daily.   spironolactone (ALDACTONE) 25 MG tablet Take 1 tablet (25 mg total) by mouth daily.   VENTOLIN HFA 108 (90 Base) MCG/ACT inhaler Inhale 2 puffs into the lungs every 6 (six) hours as needed.   Allergies: Allergies as of 05/12/2023   (No Known Allergies)   Family History:  Family History  Problem Relation Age of Onset   Heart disease Mother         had a pacemaker   Social History: In-between PCPs. Drinks alcohol occasionally, smokes 3-4 cigarettes per day and has smoked since age 59, uses marijuana and cocaine with last use 3 days ago. Independent in ADLs and IADLs.   Review of Systems: A complete ROS was negative except as per HPI.   Physical Exam: Blood pressure (!) 161/104, pulse 80, temperature 98.7 F (37.1 C), temperature source Oral, resp. rate 20, height 5\' 4"  (1.626 m), weight 65.8 kg, last menstrual period 12/24/2015, SpO2 96%. Physical Exam Constitutional:      General: She is not in acute distress.    Appearance: She is not ill-appearing or toxic-appearing.  Cardiovascular:     Rate and Rhythm: Normal rate and regular rhythm.     Heart sounds: No murmur heard. Pulmonary:     Effort: No tachypnea, accessory muscle usage or respiratory distress.     Breath sounds: Examination of the right-lower field reveals rales. Examination of the left-lower field reveals rales. Rales (Minimal) present.  Abdominal:     Palpations: Abdomen is soft.     Tenderness: There is no abdominal tenderness.  Musculoskeletal:     Right lower leg: No edema.     Left lower leg: No edema.  Skin:    General: Skin is warm and dry.  Neurological:     General: No focal deficit present.     Mental Status: She is alert and oriented to person, place, and time.  Psychiatric:        Mood and Affect: Mood normal. Mood is not anxious.        Behavior: Behavior normal. Behavior is not agitated.    EKG: NSR with biatrial enlargement, LVH, and prolonged QTc.  CXR:  1. Moderate cardiomegaly.   Assessment & Plan by Problem: Principal Problem:   Severe hypertension  Severe asymptomatic hypertension In the setting of not taking home antihypertensives for 2 weeks due to running out. On chart review it appears that she has a history of lapses in medication adherence. While she is on multiple agents (4?) for BP control, without her consistently  taking these medications it is difficult to say if she has true medication therapy and warrants further evaluation for secondary causes of hypertension versus medication non-compliance resulting in need for more agents. This is certainly not helped by ongoing cocaine use and smoking. Plan: -Goal ~25% reduction of BP over next 24h to ~150/100 -Continue home diltiazem 240 mg daily, hydralazine 75 mg TID, spironolactone 25 mg daily (restarted by EDP, today's dose/first dose has been given) -Telemetry monitoring  Acute CHF exacerbation Hx HFpEF BNP elevated to 1407.8. Echocardiogram 08/2021 showed LV EF 55-60%, mild LVH, mildly enlarged RV, severely dilated RA and LA. Clinically mildly hypervolemic, s/p IV  lasix 40 mg x1 dose. Suspect exacerbation is in the setting of severe hypertension. Stable on room air, in no respiratory distress. Plan: -Echocardiogram -IV lasix 40 mg x 1 additional dose -K lower limit of normal; will check magnesium and replete if low, replete K as indicated -Strict I's & O's, daily weights -GDMT initiation if indicated by new echocardiogram  Troponinemia In patient without chest pain or evidence of ischemia on EKG. 51 > 47. Comparable to values over the last 1 year which have been in the 50's. Plan: -Telemetry monitoring -Repeat troponin and EKG if chest pain  T2DM HbA1c 6.9% 10/2022.  Plan: -Check HbA1c -Continue home jardiance 10 mg daily -CBG, SSI TID with meals; goal CBG 140-180  Elevated transaminases ALT 49, alkaline phosphatase 162. I do not appreciate prior elevations.  Suspect this is in the setting of severe hypertension. Plan: -Repeat CMP tomorrow AM  Polycythemia Hgb 15.2, variable levels over the last one year ranging from polycythemic to anemic.  Plan: -Trend CBC  Substance use disorder Positive for cocaine. Plan: -TOC consult -Cessation counseling  Chronic cough Plan: -Continue home albuterol 2 puffs q6h for wheezing -Benzonatate 200  mg TID PRN cough -Smoking cessation    Dispo: Admit patient to Observation with expected length of stay less than 2 midnights.  SignedChamp Mungo, DO 05/12/2023, 9:06 AM  After 5pm on weekdays and 1pm on weekends: On Call pager: [phone]

## 2023-05-13 ENCOUNTER — Other Ambulatory Visit (HOSPITAL_COMMUNITY): Payer: Self-pay

## 2023-05-13 DIAGNOSIS — I161 Hypertensive emergency: Secondary | ICD-10-CM | POA: Diagnosis present

## 2023-05-13 DIAGNOSIS — D649 Anemia, unspecified: Secondary | ICD-10-CM | POA: Diagnosis present

## 2023-05-13 DIAGNOSIS — Z7984 Long term (current) use of oral hypoglycemic drugs: Secondary | ICD-10-CM | POA: Diagnosis not present

## 2023-05-13 DIAGNOSIS — Z79899 Other long term (current) drug therapy: Secondary | ICD-10-CM | POA: Diagnosis not present

## 2023-05-13 DIAGNOSIS — Z91148 Patient's other noncompliance with medication regimen for other reason: Secondary | ICD-10-CM | POA: Diagnosis not present

## 2023-05-13 DIAGNOSIS — E1122 Type 2 diabetes mellitus with diabetic chronic kidney disease: Secondary | ICD-10-CM | POA: Diagnosis present

## 2023-05-13 DIAGNOSIS — T405X1A Poisoning by cocaine, accidental (unintentional), initial encounter: Secondary | ICD-10-CM | POA: Diagnosis present

## 2023-05-13 DIAGNOSIS — I5043 Acute on chronic combined systolic (congestive) and diastolic (congestive) heart failure: Secondary | ICD-10-CM | POA: Diagnosis present

## 2023-05-13 DIAGNOSIS — I5082 Biventricular heart failure: Secondary | ICD-10-CM | POA: Diagnosis present

## 2023-05-13 DIAGNOSIS — N1831 Chronic kidney disease, stage 3a: Secondary | ICD-10-CM | POA: Diagnosis present

## 2023-05-13 DIAGNOSIS — F1410 Cocaine abuse, uncomplicated: Secondary | ICD-10-CM | POA: Diagnosis present

## 2023-05-13 DIAGNOSIS — I429 Cardiomyopathy, unspecified: Secondary | ICD-10-CM | POA: Diagnosis present

## 2023-05-13 DIAGNOSIS — Z8249 Family history of ischemic heart disease and other diseases of the circulatory system: Secondary | ICD-10-CM | POA: Diagnosis not present

## 2023-05-13 DIAGNOSIS — D751 Secondary polycythemia: Secondary | ICD-10-CM | POA: Diagnosis present

## 2023-05-13 DIAGNOSIS — I130 Hypertensive heart and chronic kidney disease with heart failure and stage 1 through stage 4 chronic kidney disease, or unspecified chronic kidney disease: Secondary | ICD-10-CM | POA: Diagnosis present

## 2023-05-13 DIAGNOSIS — F17210 Nicotine dependence, cigarettes, uncomplicated: Secondary | ICD-10-CM | POA: Diagnosis present

## 2023-05-13 LAB — COMPREHENSIVE METABOLIC PANEL
ALT: 43 U/L (ref 0–44)
AST: 34 U/L (ref 15–41)
Albumin: 2.8 g/dL — ABNORMAL LOW (ref 3.5–5.0)
Alkaline Phosphatase: 159 U/L — ABNORMAL HIGH (ref 38–126)
Anion gap: 11 (ref 5–15)
BUN: 22 mg/dL (ref 8–23)
CO2: 28 mmol/L (ref 22–32)
Calcium: 8.9 mg/dL (ref 8.9–10.3)
Chloride: 100 mmol/L (ref 98–111)
Creatinine, Ser: 1.22 mg/dL — ABNORMAL HIGH (ref 0.44–1.00)
GFR, Estimated: 50 mL/min — ABNORMAL LOW (ref >60)
Glucose, Bld: 170 mg/dL — ABNORMAL HIGH (ref 70–99)
Potassium: 3.4 mmol/L — ABNORMAL LOW (ref 3.5–5.1)
Sodium: 139 mmol/L (ref 135–145)
Total Bilirubin: 0.7 mg/dL (ref 0.0–1.2)
Total Protein: 6.6 g/dL (ref 6.5–8.1)

## 2023-05-13 LAB — CBC
HCT: 46.4 % — ABNORMAL HIGH (ref 36.0–46.0)
Hemoglobin: 16 g/dL — ABNORMAL HIGH (ref 12.0–15.0)
MCH: 30.3 pg (ref 26.0–34.0)
MCHC: 34.5 g/dL (ref 30.0–36.0)
MCV: 87.9 fL (ref 80.0–100.0)
Platelets: 300 10*3/uL (ref 150–400)
RBC: 5.28 MIL/uL — ABNORMAL HIGH (ref 3.87–5.11)
RDW: 15.1 % (ref 11.5–15.5)
WBC: 5.5 10*3/uL (ref 4.0–10.5)
nRBC: 0 % (ref 0.0–0.2)

## 2023-05-13 LAB — GLUCOSE, CAPILLARY
Glucose-Capillary: 202 mg/dL — ABNORMAL HIGH (ref 70–99)
Glucose-Capillary: 120 mg/dL — ABNORMAL HIGH (ref 70–99)
Glucose-Capillary: 150 mg/dL — ABNORMAL HIGH (ref 70–99)
Glucose-Capillary: 141 mg/dL — ABNORMAL HIGH (ref 70–99)

## 2023-05-13 MED ORDER — SACUBITRIL-VALSARTAN 24-26 MG PO TABS
1.0000 | ORAL_TABLET | Freq: Two times a day (BID) | ORAL | Status: DC
  Administered 2023-05-13 – 2023-05-14 (×3): 1 via ORAL
  Filled 2023-05-13 (×3): qty 1

## 2023-05-13 MED ORDER — SENNA 8.6 MG PO TABS: 1.0000 | ORAL_TABLET | Freq: Every day | ORAL | Status: DC | PRN

## 2023-05-13 MED ORDER — POTASSIUM CHLORIDE CRYS ER 20 MEQ PO TBCR
40.0000 meq | EXTENDED_RELEASE_TABLET | Freq: Once | ORAL | Status: AC
  Administered 2023-05-13: 40 meq via ORAL
  Filled 2023-05-13: qty 2

## 2023-05-13 NOTE — Progress Notes (Addendum)
   Heart Failure Stewardship Pharmacist Progress Note   PCP: Patient, No Pcp Per PCP-Cardiologist: None    HPI:  63 yo F with PMH HFpEF, HTN, T2DM, urinary incontinence, chronic cough, polysubstance abuse (THC, cocaine). Most recent ECHO in 08/2021 with EF 55-60%, mild LVH, G2DD, normal RV function, RV mildly enlarged, LA and RA severely dilated, small pericardial effusion, mild MR.  Patient presented to the ED on 1/29 with chest pain and dyspnea. Reported worsening cough, SOB, and swelling in her feet starting about 4 days ago. Stated that chest pain/palpitations has been going on for a while, but recently worsened. Reported non-adherence to antihypertensives for the past 2 weeks as she ran out of medications and has not established with a new PCP yet. UDS positive for cocaine. Admitted with hypertensive urgency and HF exacerbation. ECHO on 1/29 showed EF now reduced to 30%, global hypokinesis, G3DD, elevated LA pressure, RV function mildly reduced and mildly enlarged, moderately elevated PASP, LA and RA mildly dilated, small pericardial effusion, trivial MR. Received furosemide 40 mg IV x2. Restarted on home antihypertensives - hydralazine and spironolactone. Given labetalol 10 mg prn x3. Given diltiazem 60 mg x1, now discontinued in setting of newly reduced EF. Potassium 40 mEq x2 given.  Attempted to discuss with patient and was unable to due to her mental status. Patient falling asleep mid conversation and not able to maintain attention.  Current HF Medications: ACE/ARB/ARNI: Entresto 24-26 mg BID MRA: spironolactone 25 mg daily SGLT2i: Jardiance 10 mg daily Other: hydralazine 75 mg TID  Prior to admission HF Medications: MRA: spironolactone 25 mg daily SGLT2i: Jardiance 10 mg daily Other: Imdur 60 mg daily (patient reported not taking), hydralazine 75 mg TID, diltiazem CD 240 mg daily   Pertinent Lab Values: Serum creatinine 1.22, BUN 22, Potassium 3.4, Sodium 139, BNP 1407.8,  Magnesium 1.8, A1c 7.2  Vital Signs: Weight: 128 lbs (admission weight: 145 lbs) Blood pressure: 160-180s/100-110s  Heart rate: 80-90s   Medication Assistance / Insurance Benefits Check: Does the patient have prescription insurance?  Yes Type of insurance plan: [place] Medicaid  Outpatient Pharmacy:  Prior to admission outpatient pharmacy: [facility] Is the patient willing to use [facility] TOC pharmacy at discharge? Yes Is the patient willing to transition their outpatient pharmacy to utilize a [facility] outpatient pharmacy?   No    Assessment: 1. Acute systolic CHF (LVEF 30%), likely due to uncontrolled hypertension and polysubstance abuse. NYHA class II symptoms. - BP remains elevated, but most recent reading is much improved at 131/82. Suspect further improvement with Entresto added today. Continue to monitor.  - Unable to assess volume status due to patient's mental status - Once euvolemia confirmed, would recommend to start carvedilol given hx of cocaine use. - Continue Entresto 24-26 mg BID  - Continue spironolactone 25 mg daily  - Continue Jardiance 10 mg daily - Continue hydralazine 75 mg TID. Consider addition of Imdur 30 mg daily for newly reduced EF. Will continue to monitor BP.  Plan: 1) Medication changes recommended at this time: - No changes at this time  2) Patient assistance: - Entresto/Jardiance/Farxiga $4 copay  3)  Education  - To be completed prior to discharge  Jarrett Ables, PharmD PGY-1 Pharmacy Resident  Sharen Hones, PharmD, BCPS Heart Failure Stewardship Pharmacist Phone [phone]

## 2023-05-13 NOTE — Progress Notes (Addendum)
                  Subjective:   Summary: 63 year old woman with history of HFpEF, HTN, T2DM, polysubstance abuse presenting with chest pain and dyspnea, admitted for hypertensive emergency and acute CHF exacerbation.   Patient feeling better today, much improved shortness of breath and no further chest pain. Informed about her reduced EF on echo.   Objective:  Vital signs in last 24 hours: Vitals:   05/13/23 0134 05/13/23 0529 05/13/23 0635 05/13/23 0908  BP: (!) 166/102 (!) 196/126 (!) 166/119 (!) 187/114  Pulse: 82 82 78 85  Resp: 17 18 17 20   Temp: (!) 97 F (36.1 C) 97.9 F (36.6 C)  98 F (36.7 C)  TempSrc:  Oral  Oral  SpO2: 96% 98% 97% 96%  Weight:  58.4 kg    Height:       Supplemental O2: Room Air SpO2: 96 %  Physical Exam:  Constitutional: well-appearing, in no acute distress Cardiovascular: RRR, no murmurs, rubs or gallops Pulmonary/Chest: normal work of breathing on room air, lungs clear to auscultation bilaterally Abdominal: soft, non-tender, non-distended Neuro: 5/5 strength, no focal deficit noted  Assessment/Plan:   Severe symptomatic hypertension BP initially elevated to 205/96, s/p labetolol and reinitiation of home antihypertensives in the ED. Hypertension likely 2/2 medication nonadherence and cocaine use. Blood pressure slightly better controlled today to 180-190s systolic. Patient not having any cardiac, respiratory, or neurologic symptoms with no evidence of end-organ damage on labs.  - Continue home hydralazine 75mg  TID, spironolactone 25mg , Jardiance 10mg .  - Starting Entresto due to new HFrEF, holding diltiazem - On telemetry  Acute decompensated heart failure New HFrEF w/EF 30% Echo showing newly reduced LVEF 30% with global hypokinesis, grade III diastolic dysfunction, mildly enlarged RV with decreased systolic function. EKG with no ischemic changes compared to prior. CXR showing pulmonary edema, BNP 1400. New HFrEF probably due to  uncontrolled blood pressure and polysubstance abuse.  - s/p IV lasix 40mg  yesterday with improvement in dyspnea - Entresto started for BP control and initiation of GDMT - Avoiding beta blockers with acute compensation and cocaine use  T2DM A1c 7.2 - Continue home Jardiance, SSI TID with meals  Elevated liver enzymes Initially elevated to ALT 49, decreased to 43 today. Likely in the setting of severe hypertension.   Polycythemia Hgb 16 today, variable over the last year. Continue to monitor with daily labs.    Diet: heart healthy/carb modified VTE: Lovenox Code: Full  Dispo: Anticipated discharge to Home pending medical stability.   Monna Fam, MD PGY-1 Internal Medicine Resident Pager Number [phone] Please contact the on call pager after 5 pm and on weekends at [phone].

## 2023-05-13 NOTE — Plan of Care (Signed)

## 2023-05-13 NOTE — Plan of Care (Signed)
  Problem: Education: Goal: Ability to describe self-care measures that may prevent or decrease complications (Diabetes Survival Skills Education) will improve Outcome: Progressing Goal: Individualized Educational Video(s) Outcome: Progressing   Problem: Coping: Goal: Ability to adjust to condition or change in health will improve Outcome: Progressing   Problem: Fluid Volume: Goal: Ability to maintain a balanced intake and output will improve Outcome: Progressing   Problem: Health Behavior/Discharge Planning: Goal: Ability to identify and utilize available resources and services will improve Outcome: Progressing Goal: Ability to manage health-related needs will improve Outcome: Progressing   Problem: Metabolic: Goal: Ability to maintain appropriate glucose levels will improve Outcome: Progressing   Problem: Nutritional: Goal: Maintenance of adequate nutrition will improve Outcome: Progressing Goal: Progress toward achieving an optimal weight will improve Outcome: Progressing   Problem: Skin Integrity: Goal: Risk for impaired skin integrity will decrease Outcome: Progressing   Problem: Tissue Perfusion: Goal: Adequacy of tissue perfusion will improve Outcome: Progressing   Problem: Education: Goal: Knowledge of General Education information will improve Description: Including pain rating scale, medication(s)/side effects and non-pharmacologic comfort measures Outcome: Progressing   Problem: Clinical Measurements: Goal: Ability to maintain clinical measurements within normal limits will improve Outcome: Progressing Goal: Will remain free from infection Outcome: Progressing Goal: Diagnostic test results will improve Outcome: Progressing Goal: Respiratory complications will improve Outcome: Progressing Goal: Cardiovascular complication will be avoided Outcome: Progressing   Problem: Activity: Goal: Risk for activity intolerance will decrease Outcome: Progressing    Problem: Nutrition: Goal: Adequate nutrition will be maintained Outcome: Progressing   Problem: Coping: Goal: Level of anxiety will decrease Outcome: Progressing   Problem: Elimination: Goal: Will not experience complications related to bowel motility Outcome: Progressing Goal: Will not experience complications related to urinary retention Outcome: Progressing   Problem: Pain Managment: Goal: General experience of comfort will improve and/or be controlled Outcome: Progressing   Problem: Safety: Goal: Ability to remain free from injury will improve Outcome: Progressing   Problem: Skin Integrity: Goal: Risk for impaired skin integrity will decrease Outcome: Progressing

## 2023-05-14 ENCOUNTER — Telehealth (HOSPITAL_COMMUNITY): Payer: Self-pay | Admitting: Pharmacy Technician

## 2023-05-14 ENCOUNTER — Other Ambulatory Visit (HOSPITAL_BASED_OUTPATIENT_CLINIC_OR_DEPARTMENT_OTHER): Payer: Self-pay

## 2023-05-14 ENCOUNTER — Other Ambulatory Visit (HOSPITAL_COMMUNITY): Payer: Self-pay

## 2023-05-14 ENCOUNTER — Encounter (HOSPITAL_COMMUNITY): Payer: Self-pay | Admitting: Internal Medicine

## 2023-05-14 DIAGNOSIS — I161 Hypertensive emergency: Secondary | ICD-10-CM | POA: Diagnosis not present

## 2023-05-14 LAB — MAGNESIUM: Magnesium: 1.9 mg/dL (ref 1.7–2.4)

## 2023-05-14 LAB — BASIC METABOLIC PANEL
Anion gap: 12 (ref 5–15)
BUN: 15 mg/dL (ref 8–23)
CO2: 23 mmol/L (ref 22–32)
Calcium: 9.2 mg/dL (ref 8.9–10.3)
Chloride: 100 mmol/L (ref 98–111)
Creatinine, Ser: 1 mg/dL (ref 0.44–1.00)
GFR, Estimated: >60 mL/min (ref >60)
Glucose, Bld: 123 mg/dL — ABNORMAL HIGH (ref 70–99)
Potassium: 4.3 mmol/L (ref 3.5–5.1)
Sodium: 135 mmol/L (ref 135–145)

## 2023-05-14 LAB — GLUCOSE, CAPILLARY: Glucose-Capillary: 249 mg/dL — ABNORMAL HIGH (ref 70–99)

## 2023-05-14 MED ORDER — SPIRONOLACTONE 25 MG PO TABS
25.0000 mg | ORAL_TABLET | Freq: Every day | ORAL | 0 refills | Status: DC
  Filled 2023-05-14: qty 60, 60d supply, fill #0

## 2023-05-14 MED ORDER — CARVEDILOL 3.125 MG PO TABS
3.1250 mg | ORAL_TABLET | Freq: Two times a day (BID) | ORAL | 0 refills | Status: DC
  Filled 2023-05-14: qty 60, 30d supply, fill #0

## 2023-05-14 MED ORDER — ACETAMINOPHEN-CAFFEINE 500-65 MG PO TABS
1.0000 | ORAL_TABLET | Freq: Once | ORAL | Status: AC
  Administered 2023-05-14: 1 via ORAL
  Filled 2023-05-14: qty 1

## 2023-05-14 MED ORDER — ISOSORB DINITRATE-HYDRALAZINE 20-37.5 MG PO TABS
1.0000 | ORAL_TABLET | Freq: Three times a day (TID) | ORAL | 0 refills | Status: DC
  Filled 2023-05-14: qty 90, 30d supply, fill #0

## 2023-05-14 MED ORDER — EMPAGLIFLOZIN 10 MG PO TABS
10.0000 mg | ORAL_TABLET | Freq: Every day | ORAL | 0 refills | Status: DC
  Filled 2023-05-14: qty 60, 60d supply, fill #0

## 2023-05-14 MED ORDER — ACETAMINOPHEN 325 MG PO TABS
650.0000 mg | ORAL_TABLET | Freq: Once | ORAL | Status: AC
  Administered 2023-05-14: 650 mg via ORAL
  Filled 2023-05-14: qty 2

## 2023-05-14 MED ORDER — POTASSIUM CHLORIDE CRYS ER 20 MEQ PO TBCR
40.0000 meq | EXTENDED_RELEASE_TABLET | Freq: Once | ORAL | Status: AC
  Administered 2023-05-14: 40 meq via ORAL
  Filled 2023-05-14: qty 2

## 2023-05-14 MED ORDER — SACUBITRIL-VALSARTAN 24-26 MG PO TABS
1.0000 | ORAL_TABLET | Freq: Two times a day (BID) | ORAL | 0 refills | Status: DC
  Filled 2023-05-14: qty 60, 30d supply, fill #0

## 2023-05-14 NOTE — Progress Notes (Signed)
Discharge instructions reviewed with pt.  Copy of instructions given to pt [facility] has filled scripts for pt and will be delivered to the discharge lounge. Pt has contacted her ride, and will pick her up at the discharge lounge.   Pt to be d/c'd via wheelchair with belongings, and will be escorted by staff.   Cindel Daugherty,RN SWOT

## 2023-05-14 NOTE — Progress Notes (Signed)
Heart Failure Nurse Navigator Progress Note  PCP: Patient, No Pcp Per PCP-Cardiologist: None Admission Diagnosis: Hypertensive urgency, Elevated troponin, Elevated BNP Admitted from: Home  Presentation:   Amanda [name] presented with hypertension, reports she hasn't taken her BP medications for weeks, productive cough, chest pain, shortness of breath, BP 187/128, HR 94, BNP 1,407, UDS + cocaine, CXR with cardiomegaly, but no evidence of pulmonary edema.   Patient was re-educated on the sign and symptoms of heart failure, daily weights, when to call her doctor or go to the ED, Diet/ fluid restrictions, reports to drinking around 7-8 pepsi's per day, and she loves salty foods, continued education on taking all her medications as prescribed and attending all medical appointments. Patient verbalized her understanding of education, a HF TOC appointment was scheduled for 05/19/2023 @ 10 am. ( 3rd attempt )   ECHO/ LVEF: 30%  Clinical Course:  Past Medical History:  Diagnosis Date   Chronic cough    Chronic kidney disease    Congestive heart failure (CHF) (HCC)    Elevated troponin 09/08/2021   Hypertension    Pulmonary edema 09/08/2021   Stress incontinence    Type 2 diabetes mellitus (HCC)      Social History   Socioeconomic History   Marital status: Single    Spouse name: Not on file   Number of children: 4   Years of education: Not on file   Highest education level: High school graduate  Occupational History    Comment: Not working right now  Tobacco Use   Smoking status: Some Days    Types: Cigarettes   Smokeless tobacco: Never  Vaping Use   Vaping status: Never Used  Substance and Sexual Activity   Alcohol use: Yes    Comment: 3 times a week   Drug use: Not Currently    Types: "Crack" cocaine   Sexual activity: Yes  Other Topics Concern   Not on file  Social History Narrative   Not on file   Social Drivers of Health   Financial Resource Strain: Low Risk   (10/28/2022)   Received from [facility]   Overall Financial Resource Strain (CARDIA)    Difficulty of Paying Living Expenses: Not hard at all  Food Insecurity: No Food Insecurity (05/12/2023)   Hunger Vital Sign    Worried About Running Out of Food in the Last Year: Never true    Ran Out of Food in the Last Year: Never true  Transportation Needs: No Transportation Needs (05/12/2023)   PRAPARE - Administrator, Civil Service (Medical): No    Lack of Transportation (Non-Medical): No  Physical Activity: Not on file  Stress: Not on file  Social Connections: Unknown (10/19/2022)   Received from [facility]   Social Network    Social Network: Not on file   Education Assessment and Provision:  Detailed education and instructions provided on heart failure disease management including the following:  Signs and symptoms of Heart Failure When to call the physician Importance of daily weights Low sodium diet Fluid restriction Medication management Anticipated future follow-up appointments  Patient education given on each of the above topics.  Patient acknowledges understanding via teach back method and acceptance of all instructions.  Education Materials:  "Living Better With Heart Failure" Booklet, HF zone tool, & Daily Weight Tracker Tool.  Patient has scale at home: Yes Patient has pill box at home: Yes    High Risk Criteria for Readmission and/or Poor Patient Outcomes:  Heart failure hospital admissions (last 6 months): 0  No Show rate: 30% Difficult social situation: No Demonstrates medication adherence: No Primary Language: English Literacy level: Reading, writing, and comprehension  Barriers of Care:   Medication/ appointment attendance compliance Diet/ fluid restrictions ( drinks 7-8 pepsi's per day, loves salty foods) Daily weights Substance abuse history  Considerations/Referrals:   Referral made to Heart Failure Pharmacist Stewardship: yes Referral made  to Heart Failure CSW/NCM TOC: Substance cessation Referral made to Heart & Vascular TOC clinic: Yes, 05/19/2023 @ 10 am. ( 3rd attempt to bring into HF TOC, has No Showed x2.   Items for Follow-up on DC/TOC: Medication/ appointment attendace compliance Diet/ fluid restrictions/ daily weights Substance cessation Continued HF education   Rhae Hammock, BSN, RN Heart Failure Print production planner Chat Only

## 2023-05-14 NOTE — Progress Notes (Signed)
Contact OC MD, patient bp 172/102, complaint of headache. Advised to recheck bp in 30 min. No new orders for bp at this time. Excedrin ordered for headache.

## 2023-05-14 NOTE — Progress Notes (Signed)
Heart Failure Stewardship Pharmacist Progress Note   PCP: Patient, No Pcp Per PCP-Cardiologist: None    HPI:  63 yo F with PMH HFpEF, HTN, T2DM, urinary incontinence, chronic cough, polysubstance abuse (THC, cocaine). Most recent ECHO in 08/2021 with EF 55-60%, mild LVH, G2DD, normal RV function, RV mildly enlarged, LA and RA severely dilated, small pericardial effusion, mild MR.  Patient presented to the ED on 1/29 with chest pain and dyspnea. Reported worsening cough, SOB, and swelling in her feet starting about 4 days ago. Stated that chest pain/palpitations has been going on for a while, but recently worsened. Reported non-adherence to antihypertensives for the past 2 weeks as she ran out of medications and has not established with a new PCP yet. UDS positive for cocaine. Admitted with hypertensive urgency and HF exacerbation. ECHO on 1/29 showed EF now reduced to 30%, global hypokinesis, G3DD, elevated LA pressure, RV function mildly reduced and mildly enlarged, moderately elevated PASP, LA and RA mildly dilated, small pericardial effusion, trivial MR. Received furosemide 40 mg IV x2. Restarted on home antihypertensives - hydralazine and spironolactone. Given labetalol 10 mg prn x3. Given diltiazem 60 mg x1, now discontinued in setting of newly reduced EF. Potassium 40 mEq x2 given.   Mental status much improved today. Says she is feeling back to normal and would like to go home. Denies SOB and congestion. No LEE. Was able to get up and walk around with no issues. Says she did have a headache early this morning, but has resolved now.   Current HF Medications: ACE/ARB/ARNI: Entresto 24-26 mg BID MRA: spironolactone 25 mg daily SGLT2i: Jardiance 10 mg daily Other: hydralazine 75 mg TID  Prior to admission HF Medications: MRA: spironolactone 25 mg daily SGLT2i: Jardiance 10 mg daily Other: Imdur 60 mg daily (patient reported not taking), hydralazine 75 mg TID, diltiazem CD 240 mg daily    Pertinent Lab Values: Serum creatinine 1.0, BUN 15, Potassium 4.3, Sodium 135, BNP 1407.8, Magnesium 1.9, A1c 7.2  Vital Signs: Weight: 128 lbs (admission weight: 145 lbs) Blood pressure: 140-180s/100-110s  Heart rate: 70-90s   Medication Assistance / Insurance Benefits Check: Does the patient have prescription insurance?  Yes Type of insurance plan: [place] Medicaid  Outpatient Pharmacy:  Prior to admission outpatient pharmacy: [facility] Is the patient willing to use [facility] TOC pharmacy at discharge? Yes Is the patient willing to transition their outpatient pharmacy to utilize a [facility] outpatient pharmacy?   No    Assessment: 1. Acute systolic CHF (LVEF 30%), likely due to uncontrolled hypertension and polysubstance abuse. NYHA class II symptoms. - BP improving, but remains elevated and HR 80s. Patient appears euvolemic. No diuretics indicated. Would recommend to start carvedilol on discharge for HF GDMT and hypertension. Carvedilol preferred given hx of cocaine use. - Recommend to continue Entresto 24-26 mg BID for HF GDMT and hypetension. Renal function improving. - Will consolidate hydralazine and PTA Imdur to Bidil to reduce pill burden on discharge - Continue spironolactone 25 mg daily  - Continue Jardiance 10 mg daily - Recommend to avoid Excedrin for headaches given caffeine likely to increase BP and HR - Patient is appropriate for discharge  Plan: 1) Medication changes recommended at this time: - Start carvedilol 3.125 mg BID - Switch hydralazine and PTA Imdur to Bidil - 1 tablet TID  2) Patient assistance: - Entresto/Jardiance/Farxiga/Bidil $4 copay  3)  Education  - Patient has been educated on current HF medications and potential additions to HF medication regimen -  Patient verbalizes understanding that over the next few months, these medication doses may change and more medications may be added to optimize HF regimen - Patient has been educated on basic disease  state pathophysiology and goals of therapy   Jarrett Ables, PharmD PGY-1 Pharmacy Resident  Sharen Hones, PharmD, BCPS Heart Failure Stewardship Pharmacist Phone [phone]

## 2023-05-14 NOTE — Discharge Summary (Signed)
Name: Amanda [name] MRN: [id number] DOB: 1960/08/02 63 y.o. PCP: Patient, No Pcp Per  Date of Admission: 05/12/2023  3:43 AM Date of Discharge: 05/14/23 Attending Physician: Dr. Antony Contras  Discharge Diagnosis: Principal Problem:   Hypertensive emergency Active Problems:   Acute pulmonary edema (HCC)   Cocaine abuse (HCC)   Acute on chronic combined systolic and diastolic heart failure (HCC)   Stage 3a chronic kidney disease (CKD) (HCC)   Severe hypertension    Discharge Medications: Allergies as of 05/14/2023   No Known Allergies      Medication List     STOP taking these medications    diltiazem 240 MG 24 hr capsule Commonly known as: CARDIZEM CD   hydrALAZINE 25 MG tablet Commonly known as: APRESOLINE   isosorbide mononitrate 60 MG 24 hr tablet Commonly known as: IMDUR       TAKE these medications    empagliflozin 10 MG Tabs tablet Commonly known as: Jardiance Take 1 tablet (10 mg total) by mouth daily. Start taking on: May 15, 2023   isosorbide-hydrALAZINE 20-37.5 MG tablet Commonly known as: BiDil Take 1 tablet by mouth 3 (three) times daily.   Multivitamin Gummies Adult Chew Chew 2 each by mouth daily.   sacubitril-valsartan 24-26 MG Commonly known as: ENTRESTO Take 1 tablet by mouth 2 (two) times daily.   spironolactone 25 MG tablet Commonly known as: ALDACTONE Take 1 tablet (25 mg total) by mouth daily. Start taking on: May 15, 2023   Ventolin HFA 108 (90 Base) MCG/ACT inhaler Generic drug: albuterol Inhale 2 puffs into the lungs every 6 (six) hours as needed.        Disposition and follow-up:   Amanda [name] was discharged from [facility] in Stable condition.  At the hospital follow up visit please address:  1.  Follow-up:  Hypertension: Multiple new medications started for BP control. Adjust as needed, recommend repeat BMP   New HFrEF: Check volume status. Now on GDMT.   2.  Labs / imaging needed  at time of follow-up: BMP  3.  Pending labs/ test needing follow-up: None  4.  Medication Changes  Started: Entresto 24-26 BID, Bidil 20-37.5 TID, Carvedilol 3.125 BID  Stopped:Diltiazem, Hydralazine, Imdur  Follow-up Appointments:  Follow-up Information     [facility]. Go in 4 day(s).   Specialty: Cardiology Why: Hospital follow up 05/19/2023 @ 10 am PLEASE bring a current medication list to appointment FREE valet parking, Entrance C, off [employer] information: [address] [place] [address] [phone]        Filomena Jungling, NP. Schedule an appointment as soon as possible for a visit.   Specialty: Nurse Practitioner Why: Please schedule hospital follow-up appt. Contact information: [address] [place] [address] [phone]                 Hospital Course by problem list:   Severe symptomatic hypertension Patient presented with chest pain, dyspneaand LEE, found to be hypertensive to greater than 200 systolic 2/2 medication nonadherence and cocaine use. Labetolol was given in the ED and her home antihypertensives were selectively reinitiated. On hospital day 1 patient was asymptomatic but BP was still significantly elevated. Per cardiology recommendations, [name] was started. Given new HFrEF and difficulty with medication adherence, antihypertensive regimen was changed to include Bidil, Entresto, Jardiance, spironolactone, and carvedilol. Follow up with PCP and cardiology scheduled.   Acute decompensated  heart failure New HFrEF w/EF 30% Patient found to have new heart failure with LVEF 30%, global hypokinesis, grade 3 diastolic dysfunction, mildly reduced RV systolic function. Received IV Lasix 40mg  x 2. Likely 2/2 uncontrolled hypertension. GDMT was initiated by starting Entresto during admission and Coreg at discharge. Outpatient cardiology appointment scheduled  at discharge.   T2DM A1c 7.2. Continued home Jardiance and started SSI during admission.   Elevated liver enzymes Initially elevated to Alt 49, downtrending on repeat labs. Likely 2/2 severe hypertension.     Discharge Subjective: Patient feeling very well today. Denies shortness of breath, chest pain, palpitations. Understands the need for adherence to her medications and abstinence from cocaine. She would like to be discharged today.   Discharge Exam:   BP (!) 149/94 (BP Location: Right Arm)   Pulse 90   Temp 97.6 F (36.4 C) (Oral)   Resp 18   Ht 5\' 4"  (1.626 m)   Wt 58.4 kg   LMP 12/24/2015 (Within Days)   SpO2 94%   BMI 22.10 kg/m  Constitutional: well-appearing, in no acute distress Cardiovascular: regular rate and rhythm, no m/r/g Pulmonary/Chest: normal work of breathing on room air, lungs clear to auscultation bilaterally Abdominal: soft, non-tender, non-distended Neurological: alert & oriented x 3, 5/5 strength in bilateral upper and lower extremities Skin: warm and dry  Pertinent Labs, Studies, and Procedures:     Latest Ref Rng & Units 05/13/2023    6:19 AM 05/12/2023    3:54 AM 05/30/2022    2:57 AM  CBC  WBC 4.0 - 10.5 K/uL 5.5  6.1  6.4   Hemoglobin 12.0 - 15.0 g/dL 16.1  09.6  8.8   Hematocrit 36.0 - 46.0 % 46.4  44.9  26.5   Platelets 150 - 400 K/uL 300  306  328        Latest Ref Rng & Units 05/14/2023    7:14 AM 05/13/2023    6:19 AM 05/12/2023    6:53 AM  CMP  Glucose 70 - 99 mg/dL 045  409  96   BUN 8 - 23 mg/dL 15  22  20    Creatinine 0.44 - 1.00 mg/dL 8.11  9.14  7.82   Sodium 135 - 145 mmol/L 135  139  139   Potassium 3.5 - 5.1 mmol/L 4.3  3.4  3.5   Chloride 98 - 111 mmol/L 100  100  102   CO2 22 - 32 mmol/L 23  28  23    Calcium 8.9 - 10.3 mg/dL 9.2  8.9  9.1   Total Protein 6.5 - 8.1 g/dL  6.6  6.4   Total Bilirubin 0.0 - 1.2 mg/dL  0.7  0.8   Alkaline Phos 38 - 126 U/L  159  162   AST 15 - 41 U/L  34  41   ALT 0 - 44 U/L  43  49      ECHOCARDIOGRAM COMPLETE Result Date: 05/12/2023    ECHOCARDIOGRAM REPORT   Patient Name:   Amanda [name] Date of Exam: 05/12/2023 Medical Rec #:  [id number]      Height:       64.0 in Accession #:    [id number]     Weight:       145.0 lb Date of Birth:  [birth year]      BSA:          1.706 m Patient Age:    62 years       BP:  161/104 mmHg Patient Gender: F              HR:           96 bpm. Exam Location:  Inpatient Procedure: 2D Echo, Cardiac Doppler, Color Doppler and Strain Analysis Indications:    I50.40* Unspecified combined systolic (congestive) and diastolic                 (congestive) heart failure  History:        Patient has prior history of Echocardiogram examinations, most                 recent 09/09/2021. CHF; Risk Factors:Hypertension and Current                 Smoker. Cocaine use.  Sonographer:    Sheralyn Boatman RDCS Referring Phys: [id number] CAROLYN GUILLOUD IMPRESSIONS  1. Left ventricular ejection fraction, by estimation, is 30%. The left ventricle has moderately decreased function. The left ventricle demonstrates global hypokinesis. There is mild concentric left ventricular hypertrophy. Left ventricular diastolic parameters are consistent with Grade III diastolic dysfunction (restrictive). Elevated left atrial pressure.  2. Right ventricular systolic function is mildly reduced. The right ventricular size is mildly enlarged. There is moderately elevated pulmonary artery systolic pressure. The estimated right ventricular systolic pressure is 50.3 mmHg.  3. Left atrial size was mildly dilated.  4. Right atrial size was mildly dilated.  5. A small pericardial effusion is present. The pericardial effusion is circumferential. There is no evidence of cardiac tamponade.  6. The mitral valve is normal in structure. Trivial mitral valve regurgitation. No evidence of mitral stenosis.  7. Tricuspid valve regurgitation is moderate.  8. The aortic valve is tricuspid. Aortic valve regurgitation is not  visualized. No aortic stenosis is present.  9. The inferior vena cava is dilated in size with <50% respiratory variability, suggesting right atrial pressure of 15 mmHg. FINDINGS  Left Ventricle: Left ventricular ejection fraction, by estimation, is 30%. The left ventricle has moderately decreased function. The left ventricle demonstrates global hypokinesis. The left ventricular internal cavity size was normal in size. There is mild concentric left ventricular hypertrophy. Left ventricular diastolic parameters are consistent with Grade III diastolic dysfunction (restrictive). Elevated left atrial pressure. Right Ventricle: The right ventricular size is mildly enlarged. No increase in right ventricular wall thickness. Right ventricular systolic function is mildly reduced. There is moderately elevated pulmonary artery systolic pressure. The tricuspid regurgitant velocity is 2.97 m/s, and with an assumed right atrial pressure of 15 mmHg, the estimated right ventricular systolic pressure is 50.3 mmHg. Left Atrium: Left atrial size was mildly dilated. Right Atrium: Right atrial size was mildly dilated. Pericardium: A small pericardial effusion is present. The pericardial effusion is circumferential. There is no evidence of cardiac tamponade. Mitral Valve: The mitral valve is normal in structure. Trivial mitral valve regurgitation. No evidence of mitral valve stenosis. Tricuspid Valve: The tricuspid valve is normal in structure. Tricuspid valve regurgitation is moderate. Aortic Valve: The aortic valve is tricuspid. Aortic valve regurgitation is not visualized. No aortic stenosis is present. Pulmonic Valve: The pulmonic valve was normal in structure. Pulmonic valve regurgitation is not visualized. Aorta: The aortic root is normal in size and structure. Venous: The inferior vena cava is dilated in size with less than 50% respiratory variability, suggesting right atrial pressure of 15 mmHg. IAS/Shunts: No atrial level shunt  detected by color flow Doppler.  LEFT VENTRICLE PLAX 2D LVIDd:  4.10 cm     Diastology LVIDs:         3.60 cm     LV e' medial:    5.55 cm/s LV PW:         1.40 cm     LV E/e' medial:  23.1 LV IVS:        1.40 cm     LV e' lateral:   4.79 cm/s LVOT diam:     2.00 cm     LV E/e' lateral: 26.8 LV SV:         57 LV SV Index:   33 LVOT Area:     3.14 cm  LV Volumes (MOD) LV vol d, MOD A2C: 83.8 ml LV vol d, MOD A4C: 83.4 ml LV vol s, MOD A2C: 60.3 ml LV vol s, MOD A4C: 60.7 ml LV SV MOD A2C:     23.5 ml LV SV MOD A4C:     83.4 ml LV SV MOD BP:      27.6 ml IVC IVC diam: 2.50 cm LEFT ATRIUM             Index        RIGHT ATRIUM           Index LA diam:        4.90 cm 2.87 cm/m   RA Area:     23.90 cm LA Vol (A2C):   39.3 ml 23.03 ml/m  RA Volume:   81.50 ml  47.76 ml/m LA Vol (A4C):   61.1 ml 35.81 ml/m LA Biplane Vol: 48.2 ml 28.25 ml/m  AORTIC VALVE LVOT Vmax:   137.67 cm/s LVOT Vmean:  81.633 cm/s LVOT VTI:    0.181 m  AORTA Ao Root diam: 2.80 cm Ao Asc diam:  3.10 cm MITRAL VALVE                TRICUSPID VALVE MV Area (PHT): 5.43 cm     TR Peak grad:   35.3 mmHg MV Decel Time: 140 msec     TR Vmax:        297.00 cm/s MV E velocity: 128.33 cm/s MV A velocity: 80.73 cm/s   SHUNTS MV E/A ratio:  1.59         Systemic VTI:  0.18 m                             Systemic Diam: 2.00 cm Dalton McleanMD Electronically signed by Wilfred Lacy Signature Date/Time: 05/12/2023/3:13:36 PM    Final    DG Chest Port 1 View Result Date: 05/12/2023 CLINICAL DATA:  63 year old female with history of hypertension, cough, chest pain and shortness of breath for the past 4 days. EXAM: PORTABLE CHEST 1 VIEW COMPARISON:  Chest x-ray 03/22/2022. FINDINGS: Lung volumes are normal. No consolidative airspace disease. No pleural effusions. No pneumothorax. No evidence of pulmonary edema. Moderate cardiomegaly. Upper mediastinal contours are within normal limits. Atherosclerotic calcifications are noted in the thoracic aorta.  IMPRESSION: 1. Moderate cardiomegaly. Electronically Signed   By: Trudie Reed M.D.   On: 05/12/2023 05:03    Signed: Monna Fam, MD PGY-1 05/14/2023, 12:39 PM   Pager: [phone]

## 2023-05-14 NOTE — Telephone Encounter (Signed)
Patient Product/process development scientist completed.    The patient is insured through [facility].     Ran test claim for Bidil and the current 30 day co-pay is $4.00.   This test claim was processed through [facility]- copay amounts may vary at other pharmacies due to pharmacy/plan contracts, or as the patient moves through the different stages of their insurance plan.     Roland Earl, CPHT Pharmacy Technician III Certified Patient Advocate [facility] Patient Advocate Team Direct Number: [phone]  Fax: [phone]

## 2023-05-14 NOTE — Plan of Care (Signed)

## 2023-05-14 NOTE — Discharge Instructions (Addendum)
Ms Amanda [name], Amanda [name] were hospitalized for severe hypertension and new heart failure. At this time your blood pressure is under control and I feel comfortable discharging you with outpatient follow up. Thank you for allowing [place] to be part of your care.   We arranged for you to follow up at:  05/19/23 with [facility] 2/26 with your [facility] PCP  Please note these changes made to your medications:   *Please START taking:  Bidil 1 tablet three times daily Entresto (sacubitril-valsartan) two times daily Jardiance 10mg  daily Spironolactone 25mg  daily Carvedilol 3.125 twice daily  *Please STOP taking:  Diltiazem Hydralazine Imdur  Please make sure to return to the hospital if you develop worsening shortness of breath, chest pain, or dizziness.   Thanks,  Dr Carlynn Purl

## 2023-05-19 ENCOUNTER — Encounter (HOSPITAL_COMMUNITY): Payer: Medicaid Other

## 2023-06-18 ENCOUNTER — Ambulatory Visit (HOSPITAL_COMMUNITY)
Admission: RE | Admit: 2023-06-18 | Discharge: 2023-06-18 | Disposition: A | Discharge status: Home / Self Care | Source: Ambulatory Visit | Attending: Emergency Medicine | Admitting: Emergency Medicine

## 2023-06-18 ENCOUNTER — Encounter (HOSPITAL_COMMUNITY): Payer: Self-pay

## 2023-06-18 VITALS — BP 168/99 | HR 92 | Temp 98.0°F | Resp 18

## 2023-06-18 DIAGNOSIS — I10 Essential (primary) hypertension: Secondary | ICD-10-CM

## 2023-06-18 DIAGNOSIS — I509 Heart failure, unspecified: Secondary | ICD-10-CM

## 2023-06-18 DIAGNOSIS — Z760 Encounter for issue of repeat prescription: Secondary | ICD-10-CM | POA: Diagnosis not present

## 2023-06-18 DIAGNOSIS — E119 Type 2 diabetes mellitus without complications: Secondary | ICD-10-CM | POA: Diagnosis not present

## 2023-06-18 MED ORDER — EMPAGLIFLOZIN 10 MG PO TABS: 10.0000 mg | ORAL_TABLET | Freq: Every day | ORAL | 0 refills | Status: AC

## 2023-06-18 MED ORDER — ISOSORB DINITRATE-HYDRALAZINE 20-37.5 MG PO TABS: 1.0000 | ORAL_TABLET | Freq: Three times a day (TID) | ORAL | 0 refills | Status: AC

## 2023-06-18 MED ORDER — SACUBITRIL-VALSARTAN 24-26 MG PO TABS: 1.0000 | ORAL_TABLET | Freq: Two times a day (BID) | ORAL | 0 refills | Status: AC

## 2023-06-18 MED ORDER — SPIRONOLACTONE 25 MG PO TABS: 25.0000 mg | ORAL_TABLET | Freq: Every day | ORAL | 0 refills | Status: AC

## 2023-06-18 MED ORDER — CARVEDILOL 3.125 MG PO TABS: 3.1250 mg | ORAL_TABLET | Freq: Two times a day (BID) | ORAL | 0 refills | Status: DC

## 2023-06-18 NOTE — ED Triage Notes (Signed)
 Pt requesting refill on her lasix and unknown b/p meds. States has been out >65months. States has a new PCP next month. Denies sx's.

## 2023-06-18 NOTE — Discharge Instructions (Addendum)
 I have refilled all of your medications - you will have enough to last until your primary care visit on April 4th. It's very important to keep that appointment for follow up! If you develop any symptoms please be seen in the emergency department.  Coreg, BiDil, Entresto, and Aldactone are for your heart failure and high blood pressure. [place] Pepper is for your diabetes

## 2023-06-18 NOTE — ED Provider Notes (Signed)
 [facility]    CSN: [id number] Arrival date & time: 06/18/23  1224      History   Chief Complaint Chief Complaint  Patient presents with   Medication Refill    HPI Amanda [name] is a 63 y.o. female.  Here requesting medication refill Asking for her BP meds although she does not know the name of them.  She is feeling well today, no symptoms.   New primary care visit is in one month on 4/4 Was seen by different PCP in October, was prescribed hydralazine, aldactone, and diltiazem   Was seen in the ED last month for hypertensive urgency  Original meds apart from aldactone were discontinued, and was instead supposed to start Entresto, BiDil, and carvedilol per hospital discharge documentation.  History of heart failure, CKD, cocaine use, T2DM Had appointment last month with cardiology but did not go  Past Medical History:  Diagnosis Date   Chronic cough    Chronic kidney disease    Congestive heart failure (CHF) (HCC)    Elevated troponin 09/08/2021   Hypertension    Pulmonary edema 09/08/2021   Stress incontinence    Type 2 diabetes mellitus (HCC)     Patient Active Problem List   Diagnosis Date Noted   Severe hypertension 05/12/2023   Anemia associated with acute blood loss 05/30/2022   Symptomatic anemia 05/29/2022   Epistaxis 05/29/2022   HTN (hypertension) 05/29/2022   Acute on chronic combined systolic and diastolic heart failure (HCC) 03/18/2022   Stage 3a chronic kidney disease (CKD) (HCC) 03/18/2022   Hypertensive emergency 09/08/2021   Acute pulmonary edema (HCC) 09/08/2021   Tobacco abuse 09/08/2021   Cocaine abuse (HCC) 09/08/2021   History of alcohol abuse 09/08/2021   Abnormal chest x-ray 09/08/2021    Past Surgical History:  Procedure Laterality Date   I & D EXTREMITY Right 01/21/2014   Procedure: IRRIGATION AND DEBRIDEMENT RIGHT INDEX FINGER;  Surgeon: Betha Loa, MD;  Location: [facility];  Service: Orthopedics;  Laterality: Right;     OB History   No obstetric history on file.      Home Medications    Prior to Admission medications   Medication Sig Start Date End Date Taking? Authorizing Provider  carvedilol (COREG) 3.125 MG tablet Take 1 tablet (3.125 mg total) by mouth 2 (two) times daily. 06/18/23 07/18/23  Korbin Mapps, Lurena Joiner, PA-C  empagliflozin (JARDIANCE) 10 MG TABS tablet Take 1 tablet (10 mg total) by mouth daily. 06/18/23 07/18/23  Aiyden Lauderback, PA-C  isosorbide-hydrALAZINE (BIDIL) 20-37.5 MG tablet Take 1 tablet by mouth 3 (three) times daily. 06/18/23 07/18/23  Jibran Crookshanks, Lurena Joiner, PA-C  Multiple Vitamins-Minerals (MULTIVITAMIN GUMMIES ADULT) CHEW Chew 2 each by mouth daily.    [provider]  sacubitril-valsartan (ENTRESTO) 24-26 MG Take 1 tablet by mouth 2 (two) times daily. 06/18/23 07/18/23  Karleigh Bunte, Lurena Joiner, PA-C  spironolactone (ALDACTONE) 25 MG tablet Take 1 tablet (25 mg total) by mouth daily. 06/18/23 07/18/23  Jamillah Camilo, PA-C  VENTOLIN HFA 108 (90 Base) MCG/ACT inhaler Inhale 2 puffs into the lungs every 6 (six) hours as needed.    [provider]    Family History Family History  Problem Relation Age of Onset   Heart disease Mother        had a pacemaker    Social History Social History   Tobacco Use   Smoking status: Some Days    Types: Cigarettes   Smokeless tobacco: Never  Vaping Use   Vaping status: Never  Used  Substance Use Topics   Alcohol use: Yes    Comment: 3 times a week   Drug use: Not Currently    Types: "Crack" cocaine     Allergies   Patient has no known allergies.   Review of Systems Review of Systems Per HPI  Physical Exam Triage Vital Signs ED Triage Vitals  Encounter Vitals Group     BP 06/18/23 1306 (!) 168/99     Systolic BP Percentile --      Diastolic BP Percentile --      Pulse Rate 06/18/23 1306 92     Resp 06/18/23 1306 18     Temp 06/18/23 1306 98 F (36.7 C)     Temp Source 06/18/23 1306 Oral     SpO2 06/18/23 1306 94 %      Weight --      Height --      Head Circumference --      Peak Flow --      Pain Score 06/18/23 1307 0     Pain Loc --      Pain Education --      Exclude from Growth Chart --    No data found.  Updated Vital Signs BP (!) 168/99 (BP Location: Right Arm)   Pulse 92   Temp 98 F (36.7 C) (Oral)   Resp 18   LMP 12/24/2015 (Within Days)   SpO2 94%   Visual Acuity Right Eye Distance:   Left Eye Distance:   Bilateral Distance:    Right Eye Near:   Left Eye Near:    Bilateral Near:     Physical Exam Vitals and nursing note reviewed.  Constitutional:      General: She is not in acute distress. HENT:     Mouth/Throat:     Mouth: Mucous membranes are moist.     Pharynx: Oropharynx is clear.  Eyes:     Conjunctiva/sclera: Conjunctivae normal.  Cardiovascular:     Rate and Rhythm: Normal rate and regular rhythm.     Pulses: Normal pulses.     Heart sounds: Normal heart sounds, S1 normal and S2 normal.  Pulmonary:     Effort: Pulmonary effort is normal.     Breath sounds: Normal breath sounds.  Musculoskeletal:     Right lower leg: No edema.     Left lower leg: No edema.  Skin:    General: Skin is warm and dry.  Neurological:     General: No focal deficit present.     Mental Status: She is alert and oriented to person, place, and time.     Motor: No weakness.     Gait: Gait normal.     UC Treatments / Results  Labs (all labs ordered are listed, but only abnormal results are displayed) Labs Reviewed - No data to display  EKG  Radiology No results found.  Procedures Procedures (including critical care time)  Medications Ordered in UC Medications - No data to display  Initial Impression / Assessment and Plan / UC Course  I have reviewed the triage vital signs and the nursing notes.  Pertinent labs & imaging results that were available during my care of the patient were reviewed by me and considered in my medical decision making (see chart for  details).  Will refill Entresto (sacubitril-valsartan), BiDil (isosorbide-hydralazine), and carvedilol. Each with 30 day supply. I have also sent a refill of the aldactone and jardiance since she would run out just a  few days before PCP visit. Advised strict ED precautions for any new symptoms.  Stressed importance of keeping PCP visit for follow up.  Final Clinical Impressions(s) / UC Diagnoses   Final diagnoses:  Primary hypertension  Type 2 diabetes mellitus without complication, without long-term current use of insulin (HCC)  Medication refill  Congestive heart failure, unspecified HF chronicity, unspecified heart failure type [facility])     Discharge Instructions      I have refilled all of your medications - you will have enough to last until your primary care visit on April 4th. It's very important to keep that appointment for follow up! If you develop any symptoms please be seen in the emergency department.  Coreg, BiDil, Entresto, and Aldactone are for your heart failure and high blood pressure. [place] Pepper is for your diabetes      ED Prescriptions     Medication Sig Dispense Auth. Provider   carvedilol (COREG) 3.125 MG tablet Take 1 tablet (3.125 mg total) by mouth 2 (two) times daily. 60 tablet Caylin Raby, PA-C   empagliflozin (JARDIANCE) 10 MG TABS tablet Take 1 tablet (10 mg total) by mouth daily. 30 tablet Markeesha Char, PA-C   isosorbide-hydrALAZINE (BIDIL) 20-37.5 MG tablet Take 1 tablet by mouth 3 (three) times daily. 90 tablet Hadi Dubin, PA-C   sacubitril-valsartan (ENTRESTO) 24-26 MG Take 1 tablet by mouth 2 (two) times daily. 60 tablet Nikayla Madaris, PA-C   spironolactone (ALDACTONE) 25 MG tablet Take 1 tablet (25 mg total) by mouth daily. 60 tablet Batoul Limes, Lurena Joiner, PA-C      PDMP not reviewed this encounter.   Marlow Baars, [place] 06/18/23 1349

## 2023-08-09 ENCOUNTER — Other Ambulatory Visit (HOSPITAL_COMMUNITY): Payer: Self-pay

## 2023-08-29 ENCOUNTER — Ambulatory Visit (HOSPITAL_COMMUNITY)

## 2023-08-30 ENCOUNTER — Ambulatory Visit (HOSPITAL_COMMUNITY): Admission: EM | Admit: 2023-08-30 | Discharge: 2023-08-30 | Disposition: A | Discharge status: Home / Self Care

## 2023-08-30 ENCOUNTER — Encounter (HOSPITAL_COMMUNITY): Payer: Self-pay

## 2023-08-30 ENCOUNTER — Ambulatory Visit (HOSPITAL_COMMUNITY)

## 2023-08-30 DIAGNOSIS — K047 Periapical abscess without sinus: Secondary | ICD-10-CM | POA: Diagnosis not present

## 2023-08-30 MED ORDER — NYSTATIN 100000 UNIT/ML MT SUSP: 5.0000 mL | Freq: Four times a day (QID) | OROMUCOSAL | 0 refills | Status: DC | PRN

## 2023-08-30 MED ORDER — AMOXICILLIN-POT CLAVULANATE 875-125 MG PO TABS: 1.0000 | ORAL_TABLET | Freq: Two times a day (BID) | ORAL | 0 refills | Status: DC

## 2023-08-30 MED ORDER — ACETAMINOPHEN 500 MG PO TABS: 500.0000 mg | ORAL_TABLET | Freq: Four times a day (QID) | ORAL | 0 refills | Status: AC | PRN

## 2023-08-30 MED ORDER — CHLORHEXIDINE GLUCONATE 0.12 % MT SOLN: 15.0000 mL | Freq: Two times a day (BID) | OROMUCOSAL | 0 refills | Status: DC

## 2023-08-30 NOTE — Discharge Instructions (Addendum)
 Start taking Augmentin  twice daily for 7 days for dental infection. Use Peridex  mouthwash twice daily for further infection prevention. Use Magic mouthwash up to 4 times daily as needed for mouth pain. Take 500 mg of Tylenol  every 6 hours as needed for pain. I have attached a list of dental resources in your paperwork that you can follow-up with.  You can also try calling [facility]: [phone] Located at [address] [place], [place] [address]  Return here as needed.

## 2023-08-30 NOTE — ED Provider Notes (Signed)
 [facility]    CSN: [id number] Arrival date & time: 08/30/23  1206      History   Chief Complaint Chief Complaint  Patient presents with   Abscess    HPI Amanda [name] is a 63 y.o. female.   Patient presents with concerns for dental abscess.  Patient reports that she began having right lower dental pain a few days ago and then noticed some facial swelling and reports some tenderness to her right lower jaw.  Denies fever and purulent drainage from her mouth.  Patient denies having a dentist.  The history is provided by the patient and medical records.  Abscess   Past Medical History:  Diagnosis Date   Chronic cough    Chronic kidney disease    Congestive heart failure (CHF) (HCC)    Elevated troponin 09/08/2021   Hypertension    Pulmonary edema 09/08/2021   Stress incontinence    Type 2 diabetes mellitus (HCC)     Patient Active Problem List   Diagnosis Date Noted   Severe hypertension 05/12/2023   Anemia associated with acute blood loss 05/30/2022   Symptomatic anemia 05/29/2022   Epistaxis 05/29/2022   HTN (hypertension) 05/29/2022   Acute on chronic combined systolic and diastolic heart failure (HCC) 03/18/2022   Stage 3a chronic kidney disease (CKD) (HCC) 03/18/2022   Hypertensive emergency 09/08/2021   Acute pulmonary edema (HCC) 09/08/2021   Tobacco abuse 09/08/2021   Cocaine abuse (HCC) 09/08/2021   History of alcohol abuse 09/08/2021   Abnormal chest x-ray 09/08/2021    Past Surgical History:  Procedure Laterality Date   I & D EXTREMITY Right 01/21/2014   Procedure: IRRIGATION AND DEBRIDEMENT RIGHT INDEX FINGER;  Surgeon: Brunilda Capra, MD;  Location[facility];  Service: Orthopedics;  Laterality: Right;    OB History   No obstetric history on file.      Home Medications    Prior to Admission medications   Medication Sig Start Date End Date Taking? Authorizing Provider  acetaminophen  (TYLENOL ) 500 MG tablet Take 1 tablet (500 mg  total) by mouth every 6 (six) hours as needed. 08/30/23  Yes Levora Reas A, NP  amoxicillin -clavulanate (AUGMENTIN ) 875-125 MG tablet Take 1 tablet by mouth every 12 (twelve) hours. 08/30/23  Yes Rosevelt Constable, Perrin Gens A, NP  chlorhexidine  (PERIDEX ) 0.12 % solution Use as directed 15 mLs in the mouth or throat 2 (two) times daily. 08/30/23  Yes Levora Reas A, NP  diltiazem  (CARDIZEM  CD) 240 MG 24 hr capsule Take 240 mg by mouth daily. 07/12/23  Yes [provider]  empagliflozin  (JARDIANCE ) 10 MG TABS tablet Take 10 mg by mouth. 07/16/23  Yes [provider]  magic mouthwash (nystatin , lidocaine , diphenhydrAMINE, alum & mag hydroxide) suspension Swish and spit 5 mLs 4 (four) times daily as needed for mouth pain. 08/30/23  Yes Rosevelt Constable, Coree Riester A, NP  sacubitril -valsartan  (ENTRESTO ) 24-26 MG Take 1 tablet by mouth. 07/16/23  Yes [provider]  carvedilol  (COREG ) 3.125 MG tablet Take 1 tablet (3.125 mg total) by mouth 2 (two) times daily. 06/18/23 07/18/23  Rising, Ivette Marks, PA-C  Multiple Vitamins-Minerals (MULTIVITAMIN GUMMIES ADULT) CHEW Chew 2 each by mouth daily.    [provider]  spironolactone  (ALDACTONE ) 25 MG tablet Take 1 tablet (25 mg total) by mouth daily. 06/18/23 07/18/23  Rising, Ivette Marks, PA-C  VENTOLIN  HFA 108 (90 Base) MCG/ACT inhaler Inhale 2 puffs into the lungs every 6 (six) hours as needed.    [provider]  Family History Family History  Problem Relation Age of Onset   Heart disease Mother        had a pacemaker    Social History Social History   Tobacco Use   Smoking status: Some Days    Types: Cigarettes   Smokeless tobacco: Never  Vaping Use   Vaping status: Never Used  Substance Use Topics   Alcohol use: Yes    Comment: 3 times a week   Drug use: Not Currently    Types: "Crack" cocaine     Allergies   Patient has no known allergies.   Review of Systems Review of Systems  Per HPI  Physical Exam Triage Vital  Signs ED Triage Vitals  Encounter Vitals Group     BP 08/30/23 1422 (!) 181/104     Systolic BP Percentile --      Diastolic BP Percentile --      Pulse Rate 08/30/23 1422 93     Resp 08/30/23 1422 18     Temp 08/30/23 1422 98.3 F (36.8 C)     Temp Source 08/30/23 1422 Oral     SpO2 08/30/23 1422 95 %     Weight --      Height --      Head Circumference --      Peak Flow --      Pain Score 08/30/23 1420 10     Pain Loc --      Pain Education --      Exclude from Growth Chart --    No data found.  Updated Vital Signs BP (!) 181/104 (BP Location: Left Arm)   Pulse 93   Temp 98.3 F (36.8 C) (Oral)   Resp 18   LMP 12/24/2015 (Within Days)   SpO2 95%   Visual Acuity Right Eye Distance:   Left Eye Distance:   Bilateral Distance:    Right Eye Near:   Left Eye Near:    Bilateral Near:     Physical Exam Vitals and nursing note reviewed.  Constitutional:      General: She is awake. She is not in acute distress.    Appearance: Normal appearance. She is well-developed and well-groomed. She is not ill-appearing.  HENT:     Head:     Jaw: Tenderness and swelling present.     Comments: Some swelling and tenderness noted to the right lower jaw    Mouth/Throat:     Dentition: Abnormal dentition. Dental tenderness, gingival swelling, dental caries and dental abscesses present.     Comments: There is some blood pooling in the right lower mouth.  Unable to determine exactly where the blood is coming from. Skin:    General: Skin is warm and dry.  Neurological:     Mental Status: She is alert.  Psychiatric:        Behavior: Behavior is cooperative.      UC Treatments / Results  Labs (all labs ordered are listed, but only abnormal results are displayed) Labs Reviewed - No data to display  EKG   Radiology No results found.  Procedures Procedures (including critical care time)  Medications Ordered in UC Medications - No data to display  Initial Impression /  Assessment and Plan / UC Course  I have reviewed the triage vital signs and the nursing notes.  Pertinent labs & imaging results that were available during my care of the patient were reviewed by me and considered in my medical decision making (see chart  for details).     Patient is well-appearing.  Vitals are stable.  Blood pressure is 181/108.  Patient states that she has not taken her blood pressure medication today.  Upon assessment there are multiple missing teeth, and dental caries noted throughout her mouth.  Patient does have dental tenderness to the right lower mouth and there is some gingival swelling suspicious for presence of abscess.  There is also some swelling and tenderness noted to the right lower jaw.  Prescribed Augmentin  for dental abscess coverage.  Prescribed Peridex  mouthwash and Magic mouthwash to assist with additional infection prevention and pain relief.  Prescribed Tylenol  as needed for pain.  Given dental resources to follow-up with Final Clinical Impressions(s) / UC Diagnoses   Final diagnoses:  Dental abscess     Discharge Instructions      Start taking Augmentin  twice daily for 7 days for dental infection. Use Peridex  mouthwash twice daily for further infection prevention. Use Magic mouthwash up to 4 times daily as needed for mouth pain. Take 500 mg of Tylenol  every 6 hours as needed for pain. I have attached a list of dental resources in your paperwork that you can follow-up with.  You can also try calling [facility]: [phone] Located at [address] [place], [place] [address]  Return here as needed.  ED Prescriptions     Medication Sig Dispense Auth. Provider   amoxicillin -clavulanate (AUGMENTIN ) 875-125 MG tablet Take 1 tablet by mouth every 12 (twelve) hours. 14 tablet Rosevelt Constable, Rayanne Padmanabhan A, NP   acetaminophen  (TYLENOL ) 500 MG tablet Take 1 tablet (500 mg total) by mouth every 6 (six) hours as needed. 30 tablet Levora Reas A, NP    magic mouthwash (nystatin , lidocaine , diphenhydrAMINE, alum & mag hydroxide) suspension Swish and spit 5 mLs 4 (four) times daily as needed for mouth pain. 180 mL Levora Reas A, NP   chlorhexidine  (PERIDEX ) 0.12 % solution Use as directed 15 mLs in the mouth or throat 2 (two) times daily. 120 mL Levora Reas A, NP      PDMP not reviewed this encounter.   Levora Reas A, NP 08/30/23 1742

## 2023-10-20 ENCOUNTER — Telehealth (HOSPITAL_COMMUNITY): Payer: Self-pay | Admitting: Cardiology

## 2023-10-20 NOTE — Telephone Encounter (Signed)
 Called to confirm/remind patient of their appointment at the [facility] on 10/20/23.   Appointment:   [] Confirmed  [x] Left mess   [] No answer/No voice mail  [] VM Full/unable to leave message  [] Phone not in service  Patient reminded to bring all medications and/or complete list.  Confirmed patient has transportation. Gave directions, instructed to utilize valet parking.

## 2023-10-21 ENCOUNTER — Encounter (HOSPITAL_COMMUNITY): Admitting: Cardiology

## 2023-11-14 NOTE — Progress Notes (Signed)
   ADVANCED [facility] FAILURE NEW PATIENT CLINIC NOTE  Referring Physician: Rena Luke POUR, MD  Primary Care: Patient, No Pcp Per Primary Cardiologist:  HPI: Amanda [name] is a 64 y.o. female with a PMH of hypertension, chronic heart failure with reduced ejection fraction, CKD who presents for initial visit for further evaluation and treatment of heart failure/cardiomyopathy.      Patient has a past medical history of hypertension and prior cocaine use, has been admitted in the past for hypertensive urgency.  Her EF recently returned at 30% so she was sent to HF clinic for further evaluation.       SUBJECTIVE:  Patient overall is doing fair, she does report shortness of breath with more than moderate exertion but denies any other significant heart failure symptoms.  She denies any significant lower extremity swelling.  Her blood pressure is significantly elevated today, less so at home but still greater than 140.  She has no significant neuropathy currently.  No family history of cardiac disease.  PMH, current medications, allergies, social history, and family history reviewed in epic.  PHYSICAL EXAM: Vitals:   11/15/23 1519  BP: (!) 172/90  Pulse: (!) 107  SpO2: 96%   GENERAL: Well nourished and in no apparent distress at rest.  PULM:  Normal work of breathing, clear to auscultation bilaterally. Respirations are unlabored.  CARDIAC:  JVP: mildly elevated         Normal rate with regular rhythm. No murmurs, rubs or gallops.  Trace edema. Warm and well perfused extremities. ABDOMEN: Soft, non-tender, non-distended. NEUROLOGIC: Patient is oriented x3 with no focal or lateralizing neurologic deficits.    DATA REVIEW  ECG: 04/2023: Normal sinus rhythm, biatrial enlargement  ECHO: 05/12/2023: LVEF 30%, grade 3 diastolic dysfunction, mildly reduced RV systolic function, estimated RVSP of 50, with strain pattern consistent with cardiac amyloid  CATH: None   ASSESSMENT &  PLAN:  Chronic heart failure with reduced EF: Presumed NICM, but no ischemic workup done. 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 Will plan for PYP scan, if negative will need LHC. - PYP - TTR genetic testing - AL amyloid labs - Continue carvedilol  3.125 mg twice daily, increase Entresto  to 49/51 mg twice daily, continue Jardiance  10 mg daily - Continue spironolactone  25 mg daily - Euvolemic, continue Lasix  20 mg as needed - Stable NYHA class II  Hypertension: Likely driving at least part of her cardiomyopathy. - Increase in entresto  as above, stressed importance of med compliance  Polysubstance use: Stressed importance of cessation.  Elevated Hgb:  - Sleep study needed  Follow up in 2-3 months  Morene Brownie, MD Advanced [facility] Mechanical Circulatory Support 11/20/23

## 2023-11-15 ENCOUNTER — Ambulatory Visit (HOSPITAL_COMMUNITY)
Admission: RE | Admit: 2023-11-15 | Discharge: 2023-11-15 | Disposition: A | Discharge status: Home / Self Care | Source: Ambulatory Visit | Attending: Cardiology | Admitting: Cardiology

## 2023-11-15 ENCOUNTER — Encounter (HOSPITAL_COMMUNITY): Payer: Self-pay | Admitting: Cardiology

## 2023-11-15 VITALS — BP 172/90 | HR 107 | Ht 64.0 in | Wt 129.0 lb

## 2023-11-15 DIAGNOSIS — F1011 Alcohol abuse, in remission: Secondary | ICD-10-CM

## 2023-11-15 DIAGNOSIS — I5042 Chronic combined systolic (congestive) and diastolic (congestive) heart failure: Secondary | ICD-10-CM | POA: Diagnosis present

## 2023-11-15 DIAGNOSIS — I10 Essential (primary) hypertension: Secondary | ICD-10-CM | POA: Diagnosis not present

## 2023-11-15 DIAGNOSIS — F1410 Cocaine abuse, uncomplicated: Secondary | ICD-10-CM

## 2023-11-15 LAB — FOLATE: Folate: 25.9 ng/mL (ref >5.9)

## 2023-11-15 MED ORDER — SACUBITRIL-VALSARTAN 49-51 MG PO TABS: 1.0000 | ORAL_TABLET | Freq: Two times a day (BID) | ORAL | 11 refills | Status: DC

## 2023-11-15 MED ORDER — FUROSEMIDE 20 MG PO TABS: 20.0000 mg | ORAL_TABLET | ORAL | 3 refills | Status: AC | PRN

## 2023-11-15 NOTE — Patient Instructions (Signed)
 STOP  Diltiazem .  INCREASE Entresto  to 49/51 mg Twice daily  START Lasix  20 mg as needed for weight gain of 3 lb in 24 hours or 5 lb ina week.  Labs done today, your results will be available in MyChart, we will contact you for abnormal readings.  Genetic testing has been collected, this has to be sent to [place]  for processing and can take 1-2 weeks for us  to get results back.  We will let you know the results once reviewed by your provider.  You have been ordered a PYP Scan.  This is done at [facility], they will call you to schedule.  When you come for this test please plan to be there 2-3 hours.  They are located at: [address] [place], [place] [address].  Your physician recommends that you schedule a follow-up appointment in: 3 months ( November) ** PLEASE CALL THE OFFICE IN SEPTEMBER TO ARRANGE YOUR FOLLOW UP APPOINTMENT.**  If you have any questions or concerns before your next appointment please send us  a message through [facility] or call our office at [phone].    TO LEAVE A MESSAGE FOR THE NURSE SELECT OPTION 2, PLEASE LEAVE A MESSAGE INCLUDING: YOUR NAME DATE OF BIRTH CALL BACK NUMBER REASON FOR CALL**this is important as we prioritize the call backs  YOU WILL RECEIVE A CALL BACK THE SAME DAY AS LONG AS YOU CALL BEFORE 4:00 PM At the [facility], you and your health needs are our priority. As part of our continuing mission to provide you with exceptional heart care, we have created designated Provider Care Teams. These Care Teams include your primary Cardiologist (physician) and Advanced Practice Providers (APPs- Physician Assistants and Nurse Practitioners) who all work together to provide you with the care you need, when you need it.   You may see any of the following providers on your designated Care Team at your next follow up: Dr Toribio Fuel Dr Ezra Shuck Dr. Ria Commander Dr. Morene Brownie Amy Lenetta, NP Caffie Shed,  [place] [facility], [place] Beckey Coe, NP [place] Lee, NP Ellouise Class, NP Tinnie Redman, PharmD Jaun Bash, PharmD   Please be sure to bring in all your medications bottles to every appointment.    Thank you for choosing [facility]

## 2023-11-15 NOTE — Progress Notes (Signed)
 TTR genetic testing collected via blood per Dr Elwyn Lade.  Order form completed, signed and shipped with sample by FedEx to [facility].

## 2023-11-15 NOTE — Progress Notes (Signed)
 ReDS Vest / Clip - 11/15/23 1519       ReDS Vest / Clip   Station Marker A    Ruler Value 32    ReDS Value Range Low volume    ReDS Actual Value 26

## 2023-11-16 LAB — KAPPA/LAMBDA LIGHT CHAINS
Kappa free light chain: 35.6 mg/L — ABNORMAL HIGH (ref 3.3–19.4)
Kappa, lambda light chain ratio: 2.27 — ABNORMAL HIGH (ref 0.26–1.65)
Lambda free light chains: 15.7 mg/L (ref 5.7–26.3)

## 2023-11-17 LAB — MULTIPLE MYELOMA PANEL, SERUM
Albumin SerPl Elph-Mcnc: 3.8 g/dL (ref 2.9–4.4)
Albumin/Glob SerPl: 1.1 (ref 0.7–1.7)
Alpha 1: 0.2 g/dL (ref 0.0–0.4)
Alpha2 Glob SerPl Elph-Mcnc: 0.9 g/dL (ref 0.4–1.0)
B-Globulin SerPl Elph-Mcnc: 1 g/dL (ref 0.7–1.3)
Gamma Glob SerPl Elph-Mcnc: 1.4 g/dL (ref 0.4–1.8)
Globulin, Total: 3.6 g/dL (ref 2.2–3.9)
IgA: 267 mg/dL (ref 87–352)
IgG (Immunoglobin G), Serum: 1729 mg/dL — ABNORMAL HIGH (ref 586–1602)
IgM (Immunoglobulin M), Srm: 229 mg/dL — ABNORMAL HIGH (ref 26–217)
Total Protein ELP: 7.4 g/dL (ref 6.0–8.5)

## 2023-11-19 ENCOUNTER — Ambulatory Visit (HOSPITAL_COMMUNITY): Payer: Self-pay | Admitting: Cardiology

## 2023-11-30 ENCOUNTER — Telehealth (HOSPITAL_COMMUNITY): Payer: Self-pay

## 2023-11-30 NOTE — Telephone Encounter (Signed)
 Answering machine is full. Will try again later. S.Amare Bail CCT

## 2023-12-06 ENCOUNTER — Other Ambulatory Visit (HOSPITAL_COMMUNITY): Payer: Self-pay | Admitting: Cardiology

## 2023-12-06 DIAGNOSIS — I5042 Chronic combined systolic (congestive) and diastolic (congestive) heart failure: Secondary | ICD-10-CM

## 2023-12-07 ENCOUNTER — Other Ambulatory Visit (HOSPITAL_COMMUNITY): Payer: Self-pay | Admitting: Cardiology

## 2023-12-07 ENCOUNTER — Ambulatory Visit (HOSPITAL_COMMUNITY): Admission: RE | Admit: 2023-12-07 | Source: Ambulatory Visit | Attending: Cardiology | Admitting: Cardiology

## 2023-12-07 ENCOUNTER — Encounter (HOSPITAL_COMMUNITY): Payer: Self-pay | Admitting: Cardiology

## 2024-01-03 ENCOUNTER — Emergency Department (HOSPITAL_COMMUNITY)

## 2024-01-03 ENCOUNTER — Inpatient Hospital Stay (HOSPITAL_COMMUNITY)

## 2024-01-03 ENCOUNTER — Encounter (HOSPITAL_COMMUNITY): Payer: Self-pay

## 2024-01-03 ENCOUNTER — Inpatient Hospital Stay (HOSPITAL_COMMUNITY)
Admission: EM | Admit: 2024-01-03 | Discharge: 2024-04-19 | DRG: 064 | Disposition: A | Discharge status: Home / Self Care

## 2024-01-03 ENCOUNTER — Other Ambulatory Visit: Payer: Self-pay

## 2024-01-03 DIAGNOSIS — E785 Hyperlipidemia, unspecified: Secondary | ICD-10-CM | POA: Diagnosis present

## 2024-01-03 DIAGNOSIS — I619 Nontraumatic intracerebral hemorrhage, unspecified: Secondary | ICD-10-CM | POA: Diagnosis present

## 2024-01-03 DIAGNOSIS — E1122 Type 2 diabetes mellitus with diabetic chronic kidney disease: Secondary | ICD-10-CM | POA: Diagnosis present

## 2024-01-03 DIAGNOSIS — Z982 Presence of cerebrospinal fluid drainage device: Secondary | ICD-10-CM | POA: Diagnosis not present

## 2024-01-03 DIAGNOSIS — R29713 NIHSS score 13: Secondary | ICD-10-CM | POA: Diagnosis present

## 2024-01-03 DIAGNOSIS — Z560 Unemployment, unspecified: Secondary | ICD-10-CM

## 2024-01-03 DIAGNOSIS — G8194 Hemiplegia, unspecified affecting left nondominant side: Secondary | ICD-10-CM | POA: Diagnosis present

## 2024-01-03 DIAGNOSIS — R29718 NIHSS score 18: Secondary | ICD-10-CM | POA: Diagnosis not present

## 2024-01-03 DIAGNOSIS — E871 Hypo-osmolality and hyponatremia: Secondary | ICD-10-CM | POA: Diagnosis present

## 2024-01-03 DIAGNOSIS — Z7984 Long term (current) use of oral hypoglycemic drugs: Secondary | ICD-10-CM

## 2024-01-03 DIAGNOSIS — G919 Hydrocephalus, unspecified: Secondary | ICD-10-CM | POA: Diagnosis not present

## 2024-01-03 DIAGNOSIS — J9601 Acute respiratory failure with hypoxia: Secondary | ICD-10-CM | POA: Diagnosis not present

## 2024-01-03 DIAGNOSIS — N182 Chronic kidney disease, stage 2 (mild): Secondary | ICD-10-CM | POA: Diagnosis present

## 2024-01-03 DIAGNOSIS — F05 Delirium due to known physiological condition: Secondary | ICD-10-CM | POA: Diagnosis not present

## 2024-01-03 DIAGNOSIS — I615 Nontraumatic intracerebral hemorrhage, intraventricular: Secondary | ICD-10-CM | POA: Diagnosis present

## 2024-01-03 DIAGNOSIS — I10 Essential (primary) hypertension: Secondary | ICD-10-CM | POA: Diagnosis not present

## 2024-01-03 DIAGNOSIS — R0682 Tachypnea, not elsewhere classified: Secondary | ICD-10-CM | POA: Diagnosis not present

## 2024-01-03 DIAGNOSIS — R29723 NIHSS score 23: Secondary | ICD-10-CM | POA: Diagnosis not present

## 2024-01-03 DIAGNOSIS — W010XXA Fall on same level from slipping, tripping and stumbling without subsequent striking against object, initial encounter: Secondary | ICD-10-CM | POA: Diagnosis not present

## 2024-01-03 DIAGNOSIS — Z720 Tobacco use: Secondary | ICD-10-CM | POA: Diagnosis not present

## 2024-01-03 DIAGNOSIS — R4189 Other symptoms and signs involving cognitive functions and awareness: Secondary | ICD-10-CM | POA: Diagnosis present

## 2024-01-03 DIAGNOSIS — Z79899 Other long term (current) drug therapy: Secondary | ICD-10-CM | POA: Diagnosis not present

## 2024-01-03 DIAGNOSIS — R471 Dysarthria and anarthria: Secondary | ICD-10-CM | POA: Diagnosis not present

## 2024-01-03 DIAGNOSIS — F17210 Nicotine dependence, cigarettes, uncomplicated: Secondary | ICD-10-CM | POA: Diagnosis present

## 2024-01-03 DIAGNOSIS — G911 Obstructive hydrocephalus: Secondary | ICD-10-CM | POA: Diagnosis present

## 2024-01-03 DIAGNOSIS — E119 Type 2 diabetes mellitus without complications: Secondary | ICD-10-CM | POA: Diagnosis not present

## 2024-01-03 DIAGNOSIS — E873 Alkalosis: Secondary | ICD-10-CM | POA: Diagnosis present

## 2024-01-03 DIAGNOSIS — F1410 Cocaine abuse, uncomplicated: Secondary | ICD-10-CM | POA: Diagnosis present

## 2024-01-03 DIAGNOSIS — I5042 Chronic combined systolic (congestive) and diastolic (congestive) heart failure: Secondary | ICD-10-CM | POA: Diagnosis present

## 2024-01-03 DIAGNOSIS — R4182 Altered mental status, unspecified: Secondary | ICD-10-CM | POA: Diagnosis not present

## 2024-01-03 DIAGNOSIS — R9431 Abnormal electrocardiogram [ECG] [EKG]: Secondary | ICD-10-CM | POA: Diagnosis present

## 2024-01-03 DIAGNOSIS — G9341 Metabolic encephalopathy: Secondary | ICD-10-CM | POA: Diagnosis present

## 2024-01-03 DIAGNOSIS — J984 Other disorders of lung: Secondary | ICD-10-CM | POA: Diagnosis present

## 2024-01-03 DIAGNOSIS — E440 Moderate protein-calorie malnutrition: Secondary | ICD-10-CM | POA: Insufficient documentation

## 2024-01-03 DIAGNOSIS — R1310 Dysphagia, unspecified: Secondary | ICD-10-CM | POA: Diagnosis present

## 2024-01-03 DIAGNOSIS — I5021 Acute systolic (congestive) heart failure: Secondary | ICD-10-CM

## 2024-01-03 DIAGNOSIS — Z681 Body mass index (BMI) 19 or less, adult: Secondary | ICD-10-CM | POA: Diagnosis not present

## 2024-01-03 DIAGNOSIS — Z8673 Personal history of transient ischemic attack (TIA), and cerebral infarction without residual deficits: Secondary | ICD-10-CM

## 2024-01-03 DIAGNOSIS — I5040 Unspecified combined systolic (congestive) and diastolic (congestive) heart failure: Secondary | ICD-10-CM

## 2024-01-03 DIAGNOSIS — R059 Cough, unspecified: Secondary | ICD-10-CM | POA: Diagnosis present

## 2024-01-03 DIAGNOSIS — R4587 Impulsiveness: Secondary | ICD-10-CM | POA: Diagnosis present

## 2024-01-03 DIAGNOSIS — F1011 Alcohol abuse, in remission: Secondary | ICD-10-CM | POA: Diagnosis present

## 2024-01-03 DIAGNOSIS — Z781 Physical restraint status: Secondary | ICD-10-CM | POA: Diagnosis not present

## 2024-01-03 DIAGNOSIS — I610 Nontraumatic intracerebral hemorrhage in hemisphere, subcortical: Principal | ICD-10-CM | POA: Diagnosis present

## 2024-01-03 DIAGNOSIS — I130 Hypertensive heart and chronic kidney disease with heart failure and stage 1 through stage 4 chronic kidney disease, or unspecified chronic kidney disease: Secondary | ICD-10-CM | POA: Diagnosis present

## 2024-01-03 DIAGNOSIS — I509 Heart failure, unspecified: Secondary | ICD-10-CM | POA: Diagnosis not present

## 2024-01-03 DIAGNOSIS — I161 Hypertensive emergency: Secondary | ICD-10-CM | POA: Diagnosis present

## 2024-01-03 DIAGNOSIS — Z7409 Other reduced mobility: Secondary | ICD-10-CM | POA: Diagnosis present

## 2024-01-03 DIAGNOSIS — E43 Unspecified severe protein-calorie malnutrition: Secondary | ICD-10-CM | POA: Diagnosis present

## 2024-01-03 DIAGNOSIS — Y92238 Other place in hospital as the place of occurrence of the external cause: Secondary | ICD-10-CM | POA: Diagnosis not present

## 2024-01-03 DIAGNOSIS — Z8249 Family history of ischemic heart disease and other diseases of the circulatory system: Secondary | ICD-10-CM

## 2024-01-03 DIAGNOSIS — R918 Other nonspecific abnormal finding of lung field: Secondary | ICD-10-CM | POA: Diagnosis not present

## 2024-01-03 DIAGNOSIS — I69391 Dysphagia following cerebral infarction: Secondary | ICD-10-CM | POA: Diagnosis not present

## 2024-01-03 DIAGNOSIS — N189 Chronic kidney disease, unspecified: Secondary | ICD-10-CM | POA: Diagnosis not present

## 2024-01-03 LAB — POCT I-STAT 7, (LYTES, BLD GAS, ICA,H+H)
Acid-Base Excess: 3 mmol/L — ABNORMAL HIGH (ref 0.0–2.0)
Bicarbonate: 27.6 mmol/L (ref 20.0–28.0)
Calcium, Ion: 1.21 mmol/L (ref 1.15–1.40)
HCT: 40 % (ref 36.0–46.0)
Hemoglobin: 13.6 g/dL (ref 12.0–15.0)
O2 Saturation: 99 %
Patient temperature: 37
Potassium: 3.9 mmol/L (ref 3.5–5.1)
Sodium: 139 mmol/L (ref 135–145)
TCO2: 29 mmol/L (ref 22–32)
pCO2 arterial: 41.4 mmHg (ref 32–48)
pH, Arterial: 7.432 (ref 7.35–7.45)
pO2, Arterial: 136 mmHg — ABNORMAL HIGH (ref 83–108)

## 2024-01-03 LAB — I-STAT CHEM 8, ED
BUN: 16 mg/dL (ref 8–23)
Calcium, Ion: 1.01 mmol/L — ABNORMAL LOW (ref 1.15–1.40)
Chloride: 105 mmol/L (ref 98–111)
Creatinine, Ser: 1 mg/dL (ref 0.44–1.00)
Glucose, Bld: 107 mg/dL — ABNORMAL HIGH (ref 70–99)
HCT: 47 % — ABNORMAL HIGH (ref 36.0–46.0)
Hemoglobin: 16 g/dL — ABNORMAL HIGH (ref 12.0–15.0)
Potassium: 6 mmol/L — ABNORMAL HIGH (ref 3.5–5.1)
Sodium: 136 mmol/L (ref 135–145)
TCO2: 26 mmol/L (ref 22–32)

## 2024-01-03 LAB — ECHOCARDIOGRAM COMPLETE
Area-P 1/2: 2.46 cm2
Est EF: 40
Height: 65 in
S' Lateral: 3.2 cm
Weight: 1978.85 [oz_av]

## 2024-01-03 LAB — TSH: TSH: 0.262 u[IU]/mL — ABNORMAL LOW (ref 0.350–4.500)

## 2024-01-03 LAB — LIPID PANEL
Cholesterol: 149 mg/dL (ref 0–200)
HDL: 60 mg/dL (ref >40)
LDL Cholesterol: 77 mg/dL (ref 0–99)
Total CHOL/HDL Ratio: 2.5 ratio
Triglycerides: 62 mg/dL (ref <150)
VLDL: 12 mg/dL (ref 0–40)

## 2024-01-03 LAB — CBC WITH DIFFERENTIAL/PLATELET
Abs Immature Granulocytes: 0.05 K/uL (ref 0.00–0.07)
Basophils Absolute: 0 K/uL (ref 0.0–0.1)
Basophils Relative: 0 %
Eosinophils Absolute: 0.1 K/uL (ref 0.0–0.5)
Eosinophils Relative: 1 %
HCT: 45.6 % (ref 36.0–46.0)
Hemoglobin: 14.9 g/dL (ref 12.0–15.0)
Immature Granulocytes: 1 %
Lymphocytes Relative: 22 %
Lymphs Abs: 1.9 K/uL (ref 0.7–4.0)
MCH: 29 pg (ref 26.0–34.0)
MCHC: 32.7 g/dL (ref 30.0–36.0)
MCV: 88.7 fL (ref 80.0–100.0)
Monocytes Absolute: 0.6 K/uL (ref 0.1–1.0)
Monocytes Relative: 8 %
Neutro Abs: 5.7 K/uL (ref 1.7–7.7)
Neutrophils Relative %: 68 %
Platelets: 276 K/uL (ref 150–400)
RBC: 5.14 MIL/uL — ABNORMAL HIGH (ref 3.87–5.11)
RDW: 13.3 % (ref 11.5–15.5)
WBC: 8.3 K/uL (ref 4.0–10.5)
nRBC: 0 % (ref 0.0–0.2)

## 2024-01-03 LAB — I-STAT ARTERIAL BLOOD GAS, ED
Acid-Base Excess: 0 mmol/L (ref 0.0–2.0)
Bicarbonate: 28.1 mmol/L — ABNORMAL HIGH (ref 20.0–28.0)
Calcium, Ion: 1.17 mmol/L (ref 1.15–1.40)
HCT: 39 % (ref 36.0–46.0)
Hemoglobin: 13.3 g/dL (ref 12.0–15.0)
O2 Saturation: 100 %
Patient temperature: 98.6
Potassium: 4 mmol/L (ref 3.5–5.1)
Sodium: 139 mmol/L (ref 135–145)
TCO2: 30 mmol/L (ref 22–32)
pCO2 arterial: 57.8 mmHg — ABNORMAL HIGH (ref 32–48)
pH, Arterial: 7.295 — ABNORMAL LOW (ref 7.35–7.45)
pO2, Arterial: 534 mmHg — ABNORMAL HIGH (ref 83–108)

## 2024-01-03 LAB — COMPREHENSIVE METABOLIC PANEL WITH GFR
ALT: 14 U/L (ref 0–44)
AST: 24 U/L (ref 15–41)
Albumin: 2.9 g/dL — ABNORMAL LOW (ref 3.5–5.0)
Alkaline Phosphatase: 66 U/L (ref 38–126)
Anion gap: 13 (ref 5–15)
BUN: 13 mg/dL (ref 8–23)
CO2: 22 mmol/L (ref 22–32)
Calcium: 9 mg/dL (ref 8.9–10.3)
Chloride: 100 mmol/L (ref 98–111)
Creatinine, Ser: 1.04 mg/dL — ABNORMAL HIGH (ref 0.44–1.00)
GFR, Estimated: >60 mL/min (ref >60)
Glucose, Bld: 98 mg/dL (ref 70–99)
Potassium: 4.5 mmol/L (ref 3.5–5.1)
Sodium: 135 mmol/L (ref 135–145)
Total Bilirubin: 0.6 mg/dL (ref 0.0–1.2)
Total Protein: 7 g/dL (ref 6.5–8.1)

## 2024-01-03 LAB — URINALYSIS, ROUTINE W REFLEX MICROSCOPIC
Bacteria, UA: NONE SEEN
Bilirubin Urine: NEGATIVE
Glucose, UA: 50 mg/dL — AB
Hgb urine dipstick: NEGATIVE
Ketones, ur: NEGATIVE mg/dL
Leukocytes,Ua: NEGATIVE
Nitrite: NEGATIVE
Protein, ur: 30 mg/dL — AB
Specific Gravity, Urine: >1.046 — ABNORMAL HIGH (ref 1.005–1.030)
pH: 6 (ref 5.0–8.0)

## 2024-01-03 LAB — RAPID URINE DRUG SCREEN, HOSP PERFORMED
Amphetamines: NOT DETECTED
Barbiturates: NOT DETECTED
Benzodiazepines: NOT DETECTED
Cocaine: POSITIVE — AB
Opiates: NOT DETECTED
Tetrahydrocannabinol: NOT DETECTED

## 2024-01-03 LAB — MRSA NEXT GEN BY PCR, NASAL: MRSA by PCR Next Gen: NOT DETECTED

## 2024-01-03 LAB — ETHANOL: Alcohol, Ethyl (B): <15 mg/dL (ref <15)

## 2024-01-03 LAB — TROPONIN I (HIGH SENSITIVITY)
Troponin I (High Sensitivity): 34 ng/L — ABNORMAL HIGH (ref <18)
Troponin I (High Sensitivity): 38 ng/L — ABNORMAL HIGH (ref <18)

## 2024-01-03 LAB — SALICYLATE LEVEL: Salicylate Lvl: <7 mg/dL — ABNORMAL LOW (ref 7.0–30.0)

## 2024-01-03 LAB — GLUCOSE, CAPILLARY
Glucose-Capillary: 129 mg/dL — ABNORMAL HIGH (ref 70–99)
Glucose-Capillary: 124 mg/dL — ABNORMAL HIGH (ref 70–99)
Glucose-Capillary: 94 mg/dL (ref 70–99)
Glucose-Capillary: 118 mg/dL — ABNORMAL HIGH (ref 70–99)
Glucose-Capillary: 131 mg/dL — ABNORMAL HIGH (ref 70–99)

## 2024-01-03 LAB — T4, FREE: Free T4: 0.98 ng/dL (ref 0.61–1.12)

## 2024-01-03 LAB — BRAIN NATRIURETIC PEPTIDE: B Natriuretic Peptide: 370.8 pg/mL — ABNORMAL HIGH (ref 0.0–100.0)

## 2024-01-03 LAB — PHOSPHORUS: Phosphorus: 4 mg/dL (ref 2.5–4.6)

## 2024-01-03 LAB — MAGNESIUM: Magnesium: 2.1 mg/dL (ref 1.7–2.4)

## 2024-01-03 LAB — HEMOGLOBIN A1C
Hgb A1c MFr Bld: 6.6 % — ABNORMAL HIGH (ref 4.8–5.6)
Mean Plasma Glucose: 142.72 mg/dL

## 2024-01-03 LAB — AMMONIA: Ammonia: 33 umol/L (ref 9–35)

## 2024-01-03 LAB — LIPASE, BLOOD: Lipase: 15 U/L (ref 11–51)

## 2024-01-03 LAB — ACETAMINOPHEN LEVEL: Acetaminophen (Tylenol), Serum: <10 ug/mL — ABNORMAL LOW (ref 10–30)

## 2024-01-03 MED ORDER — PROPOFOL 1000 MG/100ML IV EMUL
INTRAVENOUS | Status: AC
  Administered 2024-01-03: 20 ug/kg/min via INTRAVENOUS
  Filled 2024-01-03: qty 100

## 2024-01-03 MED ORDER — FAMOTIDINE 20 MG PO TABS
20.0000 mg | ORAL_TABLET | Freq: Every day | ORAL | Status: DC
Start: 2024-01-03 — End: 2024-01-03

## 2024-01-03 MED ORDER — NOREPINEPHRINE 4 MG/250ML-% IV SOLN: 0.0000 ug/min | INTRAVENOUS | Status: DC

## 2024-01-03 MED ORDER — IOHEXOL 350 MG/ML SOLN
100.0000 mL | Freq: Once | INTRAVENOUS | Status: AC | PRN
  Administered 2024-01-03: 100 mL via INTRAVENOUS

## 2024-01-03 MED ORDER — DOCUSATE SODIUM 50 MG/5ML PO LIQD
100.0000 mg | Freq: Two times a day (BID) | ORAL | Status: DC | PRN
  Filled 2024-01-03: qty 10

## 2024-01-03 MED ORDER — CLEVIDIPINE BUTYRATE 0.5 MG/ML IV EMUL
0.0000 mg/h | INTRAVENOUS | Status: DC
  Administered 2024-01-03: 8 mg/h via INTRAVENOUS
  Administered 2024-01-03: 6 mg/h via INTRAVENOUS
  Administered 2024-01-03: 2 mg/h via INTRAVENOUS
  Administered 2024-01-04: 12 mg/h via INTRAVENOUS
  Administered 2024-01-04: 18 mg/h via INTRAVENOUS
  Administered 2024-01-04: 10 mg/h via INTRAVENOUS
  Administered 2024-01-04: 6 mg/h via INTRAVENOUS
  Administered 2024-01-04: 8 mg/h via INTRAVENOUS
  Administered 2024-01-04: 18 mg/h via INTRAVENOUS
  Administered 2024-01-04: 20 mg/h via INTRAVENOUS
  Administered 2024-01-04: 12 mg/h via INTRAVENOUS
  Administered 2024-01-04: 6 mg/h via INTRAVENOUS
  Administered 2024-01-05: 20 mg/h via INTRAVENOUS
  Administered 2024-01-05: 10 mg/h via INTRAVENOUS
  Administered 2024-01-05: 20 mg/h via INTRAVENOUS
  Administered 2024-01-05: 18 mg/h via INTRAVENOUS
  Filled 2024-01-03 (×4): qty 50
  Filled 2024-01-03: qty 100
  Filled 2024-01-03 (×2): qty 50
  Filled 2024-01-03: qty 100
  Filled 2024-01-03 (×3): qty 50
  Filled 2024-01-03: qty 100
  Filled 2024-01-03 (×4): qty 50

## 2024-01-03 MED ORDER — HYDRALAZINE HCL 20 MG/ML IJ SOLN: 10.0000 mg | INTRAMUSCULAR | Status: DC | PRN

## 2024-01-03 MED ORDER — SPIRONOLACTONE 25 MG PO TABS
25.0000 mg | ORAL_TABLET | Freq: Every day | ORAL | Status: DC
  Administered 2024-01-03 – 2024-01-09 (×7): 25 mg
  Filled 2024-01-03 (×7): qty 1

## 2024-01-03 MED ORDER — SODIUM CHLORIDE 0.9% FLUSH: 10.0000 mL | INTRAVENOUS | Status: DC | PRN

## 2024-01-03 MED ORDER — ACETAMINOPHEN 325 MG PO TABS
650.0000 mg | ORAL_TABLET | ORAL | Status: DC | PRN
  Administered 2024-01-14 – 2024-04-17 (×31): 650 mg via ORAL
  Filled 2024-01-03 (×34): qty 2

## 2024-01-03 MED ORDER — SENNOSIDES-DOCUSATE SODIUM 8.6-50 MG PO TABS: 1.0000 | ORAL_TABLET | Freq: Two times a day (BID) | ORAL | Status: DC

## 2024-01-03 MED ORDER — LIDOCAINE HCL (PF) 1 % IJ SOLN
INTRAMUSCULAR | Status: AC
  Filled 2024-01-03: qty 5

## 2024-01-03 MED ORDER — FENTANYL 2500MCG IN NS 250ML (10MCG/ML) PREMIX INFUSION
0.0000 ug/h | INTRAVENOUS | Status: DC
  Administered 2024-01-03: 100 ug/h via INTRAVENOUS

## 2024-01-03 MED ORDER — LABETALOL HCL 5 MG/ML IV SOLN: 10.0000 mg | INTRAVENOUS | Status: DC | PRN

## 2024-01-03 MED ORDER — LABETALOL HCL 5 MG/ML IV SOLN
20.0000 mg | Freq: Once | INTRAVENOUS | Status: AC
  Administered 2024-01-03: 20 mg via INTRAVENOUS
  Filled 2024-01-03: qty 4

## 2024-01-03 MED ORDER — DOCUSATE SODIUM 50 MG/5ML PO LIQD
100.0000 mg | Freq: Two times a day (BID) | ORAL | Status: DC
  Administered 2024-01-03 – 2024-01-09 (×9): 100 mg
  Filled 2024-01-03 (×9): qty 10

## 2024-01-03 MED ORDER — LIDOCAINE HCL (CARDIAC) PF 100 MG/5ML IV SOSY
PREFILLED_SYRINGE | INTRAVENOUS | Status: AC
  Filled 2024-01-03: qty 5

## 2024-01-03 MED ORDER — STROKE: EARLY STAGES OF RECOVERY BOOK
Freq: Once | Status: AC
  Filled 2024-01-03: qty 1

## 2024-01-03 MED ORDER — SUCCINYLCHOLINE CHLORIDE 200 MG/10ML IV SOSY
PREFILLED_SYRINGE | INTRAVENOUS | Status: AC
  Administered 2024-01-03: 100 mg
  Filled 2024-01-03: qty 10

## 2024-01-03 MED ORDER — ORAL CARE MOUTH RINSE
15.0000 mL | OROMUCOSAL | Status: DC
  Administered 2024-01-03 – 2024-01-06 (×34): 15 mL via OROMUCOSAL

## 2024-01-03 MED ORDER — FENTANYL CITRATE PF 50 MCG/ML IJ SOSY
50.0000 ug | PREFILLED_SYRINGE | INTRAMUSCULAR | Status: DC | PRN
  Administered 2024-01-03 (×2): 100 ug via INTRAVENOUS
  Filled 2024-01-03 (×2): qty 2

## 2024-01-03 MED ORDER — CARVEDILOL 3.125 MG PO TABS
3.1250 mg | ORAL_TABLET | Freq: Two times a day (BID) | ORAL | Status: DC
  Administered 2024-01-03 – 2024-01-04 (×2): 3.125 mg
  Filled 2024-01-03 (×2): qty 1

## 2024-01-03 MED ORDER — PANTOPRAZOLE SODIUM 40 MG IV SOLR
40.0000 mg | Freq: Every day | INTRAVENOUS | Status: DC
  Administered 2024-01-03 – 2024-01-09 (×7): 40 mg via INTRAVENOUS
  Filled 2024-01-03 (×7): qty 10

## 2024-01-03 MED ORDER — INSULIN ASPART 100 UNIT/ML IJ SOLN
0.0000 [IU] | INTRAMUSCULAR | Status: DC
  Administered 2024-01-03 – 2024-01-04 (×5): 1 [IU] via SUBCUTANEOUS
  Administered 2024-01-04: 2 [IU] via SUBCUTANEOUS
  Administered 2024-01-05 – 2024-01-06 (×4): 1 [IU] via SUBCUTANEOUS

## 2024-01-03 MED ORDER — PROPOFOL 1000 MG/100ML IV EMUL
0.0000 ug/kg/min | INTRAVENOUS | Status: DC
  Administered 2024-01-03 – 2024-01-04 (×2): 30 ug/kg/min via INTRAVENOUS
  Filled 2024-01-03 (×4): qty 100

## 2024-01-03 MED ORDER — MIDAZOLAM HCL 2 MG/2ML IJ SOLN: 5.0000 mg | Freq: Once | INTRAMUSCULAR | Status: DC

## 2024-01-03 MED ORDER — VITAL HP 1.0 CAL PO LIQD: 1000.0000 mL | ORAL | Status: DC

## 2024-01-03 MED ORDER — POLYETHYLENE GLYCOL 3350 17 G PO PACK
17.0000 g | PACK | Freq: Every day | ORAL | Status: DC
  Administered 2024-01-03 – 2024-01-06 (×4): 17 g
  Filled 2024-01-03 (×4): qty 1

## 2024-01-03 MED ORDER — FENTANYL 2500MCG IN NS 250ML (10MCG/ML) PREMIX INFUSION
INTRAVENOUS | Status: AC
  Filled 2024-01-03: qty 250

## 2024-01-03 MED ORDER — THIAMINE MONONITRATE 100 MG PO TABS
100.0000 mg | ORAL_TABLET | Freq: Every day | ORAL | Status: AC
  Administered 2024-01-03 – 2024-01-09 (×7): 100 mg
  Filled 2024-01-03 (×7): qty 1

## 2024-01-03 MED ORDER — ACETAMINOPHEN 160 MG/5ML PO SOLN
650.0000 mg | ORAL | Status: DC | PRN
  Administered 2024-01-03 – 2024-02-10 (×30): 650 mg
  Filled 2024-01-03 (×32): qty 20.3

## 2024-01-03 MED ORDER — POLYETHYLENE GLYCOL 3350 17 G PO PACK: 17.0000 g | PACK | Freq: Every day | ORAL | Status: DC | PRN

## 2024-01-03 MED ORDER — SODIUM CHLORIDE 0.9 % IV SOLN: INTRAVENOUS | Status: DC

## 2024-01-03 MED ORDER — SODIUM CHLORIDE 0.9% FLUSH
10.0000 mL | Freq: Two times a day (BID) | INTRAVENOUS | Status: DC
  Administered 2024-01-03: 20 mL
  Administered 2024-01-04: 30 mL
  Administered 2024-01-04 – 2024-01-06 (×3): 10 mL
  Administered 2024-01-07 (×2): 20 mL
  Administered 2024-01-08 (×2): 10 mL
  Administered 2024-01-09: 40 mL
  Administered 2024-01-09: 10 mL
  Administered 2024-01-10: 40 mL
  Administered 2024-01-10 – 2024-01-14 (×6): 10 mL

## 2024-01-03 MED ORDER — ETOMIDATE 2 MG/ML IV SOLN
INTRAVENOUS | Status: AC
  Administered 2024-01-03: 20 mg
  Filled 2024-01-03: qty 10

## 2024-01-03 MED ORDER — PROSOURCE TF20 ENFIT COMPATIBL EN LIQD
60.0000 mL | Freq: Every day | ENTERAL | Status: DC
  Administered 2024-01-03 – 2024-02-18 (×47): 60 mL
  Filled 2024-01-03 (×47): qty 60

## 2024-01-03 MED ORDER — SODIUM CHLORIDE 0.9 % IV SOLN: 250.0000 mL | INTRAVENOUS | Status: AC

## 2024-01-03 MED ORDER — OSMOLITE 1.5 CAL PO LIQD
1000.0000 mL | ORAL | Status: DC
  Administered 2024-01-03 – 2024-02-17 (×29): 1000 mL
  Filled 2024-01-03 (×9): qty 1000

## 2024-01-03 MED ORDER — SACUBITRIL-VALSARTAN 49-51 MG PO TABS
1.0000 | ORAL_TABLET | Freq: Two times a day (BID) | ORAL | Status: DC
Start: 2024-01-03 — End: 2024-01-04
  Administered 2024-01-03 (×2): 1
  Filled 2024-01-03 (×3): qty 1

## 2024-01-03 MED ORDER — ACETAMINOPHEN 650 MG RE SUPP
650.0000 mg | RECTAL | Status: DC | PRN
  Administered 2024-01-05: 650 mg via RECTAL
  Filled 2024-01-03: qty 1

## 2024-01-03 MED ORDER — CHLORHEXIDINE GLUCONATE CLOTH 2 % EX PADS
6.0000 | MEDICATED_PAD | Freq: Every day | CUTANEOUS | Status: DC
  Administered 2024-01-03 – 2024-01-14 (×13): 6 via TOPICAL

## 2024-01-03 MED ORDER — FENTANYL CITRATE PF 50 MCG/ML IJ SOSY
50.0000 ug | PREFILLED_SYRINGE | INTRAMUSCULAR | Status: DC | PRN
  Administered 2024-01-03: 50 ug via INTRAVENOUS
  Filled 2024-01-03: qty 1

## 2024-01-03 MED ORDER — PROPOFOL 1000 MG/100ML IV EMUL
0.0000 ug/kg/min | INTRAVENOUS | Status: DC
  Administered 2024-01-03 (×2): 20 ug/kg/min via INTRAVENOUS
  Administered 2024-01-03: 10 ug/kg/min via INTRAVENOUS
  Administered 2024-01-03: 50 ug/kg/min via INTRAVENOUS
  Administered 2024-01-03 (×2): 20 ug/kg/min via INTRAVENOUS

## 2024-01-03 MED ORDER — ORAL CARE MOUTH RINSE: 15.0000 mL | OROMUCOSAL | Status: DC | PRN

## 2024-01-03 NOTE — Progress Notes (Signed)
 OT Cancellation Note  Patient Details Name: Amanda [name] MRN: [id number] DOB: [birth year]   Cancelled Treatment:    Reason Eval/Treat Not Completed: Medical issues which prohibited therapy.  Spoke to RN, advised to hold.    Cornell Bourbon D Cabell Lazenby 01/03/2024, 9:31 AM 01/03/2024  RP, OTR/L  Acute Rehabilitation Services  Office:  [phone]

## 2024-01-03 NOTE — H&P (Addendum)
 NAME:  Amanda [name], MRN:  [id number], DOB:  1960/12/20, LOS: 0 ADMISSION DATE:  01/03/2024, CONSULTATION DATE:  01/03/2024 REFERRING MD: Carita Senior, MD, CHIEF COMPLAINT:  AMS for one hour   History of Present Illness:  [name] with HTN, CHF (systolic and diastolic, EF 30%, G3DD, RVSP 50.3 mmHg), DM-2, and polysubstance/cocaine abuse 3 hrs before presentation, who called EMS for AMS, lethargy and near syncope. Her BP in ED 180/120, repeat BP 220/126. She was oriented to person only. She denied headache, chest pain, shortness of breath, abdominal pain, nausea and vomiting. Unable to give any meaningful history. Her condition deteriorated (unable to protect her airway and more lethargic) and got intubated. Clevidipine  was started for BP control and Propofol  for sedation. On NS 0.9% infusion.   Pertinent  Medical History  HTN, CHF systolic and diastolic (Jan 2025 Echo: EF 30%, G3DD, RVSP 50.3 mmHg), DM-2, polysubstance/cocaine abuse  Significant Hospital Events: Including procedures, antibiotic start and stop dates in addition to other pertinent events   01/03/2024: ED eval 9/22: PCCM called for admission   Interim History / Subjective:    Objective    Blood pressure 98/74, pulse 66, temperature 98.7 F (37.1 C), resp. rate 18, last menstrual period 12/24/2015, SpO2 100%.    Vent Mode: PRVC FiO2 (%):  [100 %] 100 % Set Rate:  [22 bmp] 22 bmp Vt Set:  [540 mL] 540 mL PEEP:  [5 cmH20] 5 cmH20  No intake or output data in the 24 hours ending 01/03/24 0434 There were no vitals filed for this visit.  Examination: General: sedated. Intubated 7.5 ETT. SpO2 100%  HENT: PERL. No LNE or thyromegaly. +ve JVD Lungs: symmetrical air entry bilaterally. No crackles or wheezing Cardiovascular: NL S1/S2. No m/g/r Abdomen: no distension or tenderness Extremities: no edema. 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 -Admit to neuro ICU -BP control: SBP<160  -IVF -Echo -Sedation -Glycemic control -Avoid hyperthermia, hypoglycemia, and monitor for seizures -f/u repeat CT head (7 am), ? EVD -Neurology and neuroSx consult  HTN, systolic and diastolic CHF (Jan 2025 Echo: EF 30%, G3DD, RVSP 50.3 mmHg) -Echo -BP control  DM-2 -ISS -HbA1c -Glycemic control 140-180 mg/dl  Polysubstance/cocaine abuse -UDS -Counseling when appropriate   Mechanical ventilation for airway protection -VAP and PAD protocols -Lung protection measures -HOB elevation -Dietitian consult -ABG  New suspected right apical pleural parenchymal scarring -Consider follow-up CT chest in 3 months  Labs   CBC: Recent Labs  Lab 01/03/24 0145 01/03/24 0153  WBC 8.3  --   NEUTROABS 5.7  --   HGB 14.9 16.0*  HCT 45.6 47.0*  MCV 88.7  --   PLT 276  --     Basic Metabolic Panel: Recent Labs  Lab 01/03/24 0153 01/03/24 0245  NA 136 135  K 6.0* 4.5  CL 105 100  CO2  --  22  GLUCOSE 107* 98  BUN 16 13  CREATININE 1.00 1.04*  CALCIUM  --  9.0   GFR: CrCl cannot be calculated (Unknown ideal weight.). Recent Labs  Lab 01/03/24 0145  WBC 8.3    Liver Function Tests: Recent Labs  Lab 01/03/24 0245  AST 24  ALT 14  ALKPHOS 66  BILITOT 0.6  PROT 7.0  ALBUMIN 2.9*   No results for input(s): LIPASE, AMYLASE in the last 168 hours.  Recent Labs  Lab 01/03/24 0245  AMMONIA 33    ABG    Component Value Date/Time   HCO3 24.7 09/08/2021 2159   TCO2 26 01/03/2024 0153   O2SAT 99 09/08/2021 2159     Coagulation Profile: No results for input(s): INR, PROTIME in the last 168 hours.  Cardiac Enzymes: No results for input(s): CKTOTAL, CKMB, CKMBINDEX, TROPONINI in the last 168 hours.  HbA1C: Hgb A1c MFr Bld  Date/Time Value Ref Range Status  05/12/2023 06:25 PM 7.2 (H) 4.8 - 5.6 % Final    Comment:    (NOTE) Pre diabetes:           5.7%-6.4%  Diabetes:              >6.4%  Glycemic control for   <7.0% adults with diabetes   03/22/2022 01:42 AM 6.9 (H) 4.8 - 5.6 % Final    Comment:    (NOTE)         Prediabetes: 5.7 - 6.4         Diabetes: >6.4         Glycemic control for adults with diabetes: <7.0     CBG: No results for input(s): GLUCAP in the last 168 hours.  Review of Systems:   NA  Past Medical History:  She,  has a past medical history of Chronic cough, Chronic kidney disease, Congestive heart failure (CHF) (HCC), Elevated troponin (09/08/2021), Hypertension, Pulmonary edema (09/08/2021), Stress incontinence, and Type 2 diabetes mellitus (HCC).   Surgical History:   Past Surgical History:  Procedure Laterality Date   I & D EXTREMITY Right 01/21/2014   Procedure: IRRIGATION AND DEBRIDEMENT RIGHT INDEX FINGER;  Surgeon: Franky Curia, MD;  Location: [facility] OR;  Service: Orthopedics;  Laterality: Right;     Social History:   reports that she has been smoking cigarettes. She started smoking about 45 years ago. She has a 68.6 pack-year smoking history. She has never used smokeless tobacco. She reports current alcohol use. She reports that she does not currently use drugs after having used the following drugs: Crack cocaine.   Family History:  Her family history includes Heart disease in her mother.   Allergies No Known Allergies   Home Medications  Prior to Admission medications   Medication Sig Start Date End Date Taking? Authorizing Provider  acetaminophen  (TYLENOL ) 500 MG tablet Take 1 tablet (500 mg total) by mouth every 6 (six) hours as needed. 08/30/23   Johnie Flaming A, NP  amoxicillin -clavulanate (AUGMENTIN ) 875-125 MG tablet Take 1 tablet by mouth every 12 (twelve) hours. Patient not taking: Reported on 11/15/2023 08/30/23   Johnie Flaming A, NP  carvedilol  (COREG ) 3.125 MG tablet Take 1 tablet (3.125 mg total) by mouth 2 (two) times daily. 06/18/23 11/14/24  Rising, Asberry, PA-C   chlorhexidine  (PERIDEX ) 0.12 % solution Use as directed 15 mLs in the mouth or throat 2 (two) times daily. Patient not taking: Reported on 11/15/2023 08/30/23   Johnie Flaming A, NP  empagliflozin  (JARDIANCE ) 10 MG TABS tablet Take 10 mg by mouth. 07/16/23   [provider]  furosemide  (LASIX ) 20 MG tablet Take 1 tablet (20 mg total) by mouth as needed for fluid or edema. Weight gain of 3 lb in 24 hours or 5 lb in a week. 11/15/23 02/13/24  Zenaida Morene PARAS, MD  magic mouthwash (nystatin , lidocaine , diphenhydrAMINE, alum & mag hydroxide) suspension Swish and spit 5 mLs 4 (four) times daily as needed for mouth pain. Patient not taking: Reported  on 11/15/2023 08/30/23   Johnie Rumaldo LABOR, NP  Multiple Vitamins-Minerals (MULTIVITAMIN GUMMIES ADULT) CHEW Chew 2 each by mouth daily.    [provider]  sacubitril -valsartan  (ENTRESTO ) 49-51 MG Take 1 tablet by mouth 2 (two) times daily. 11/15/23   Zenaida Morene PARAS, MD  spironolactone  (ALDACTONE ) 25 MG tablet Take 1 tablet (25 mg total) by mouth daily. 06/18/23 11/14/24  Rising, Asberry, PA-C  VENTOLIN  HFA 108 (90 Base) MCG/ACT inhaler Inhale 2 puffs into the lungs every 6 (six) hours as needed.    [provider]     Critical care time: 55 min    Mancel Ply, MD [facility] Pager: see AMION

## 2024-01-03 NOTE — Consult Note (Signed)
 CC: ICH/IVH  HPI:     Mrs. Amanda [name] is a 63 y.o. female with with history of polysubstance abuse who developed a right thalamic ICH with IVH after recent cocaine use.  She presented with mild confusion but was alert.  Initial CT head showed early hydrocephalus as well as congenital cranial dysmorphia.  She was monitored in the ICU and per report no longer was following commands and required intubation.  On my examination, patient was intubated, briskly localizing on the right side with a strong cough, with eyes closed.  There was no family that could be contacted so we elected to proceed with emergent ventriculostomy placement as in the face of neurologic worsening, the  benefits of the procedure outweighed the potential risks     Patient Active Problem List   Diagnosis Date Noted   ICH (intracerebral hemorrhage) (HCC) 01/03/2024   Stroke, hemorrhagic (HCC) 01/03/2024   Severe hypertension 05/12/2023   Anemia associated with acute blood loss 05/30/2022   Symptomatic anemia 05/29/2022   Epistaxis 05/29/2022   HTN (hypertension) 05/29/2022   Chronic combined systolic and diastolic heart failure (HCC) 03/18/2022   Stage 3a chronic kidney disease (CKD) (HCC) 03/18/2022   Hypertensive emergency 09/08/2021   Acute pulmonary edema (HCC) 09/08/2021   Tobacco abuse 09/08/2021   Cocaine abuse (HCC) 09/08/2021   History of alcohol abuse 09/08/2021   Abnormal chest x-ray 09/08/2021   Past Medical History:  Diagnosis Date   Chronic cough    Chronic kidney disease    Congestive heart failure (CHF) (HCC)    Elevated troponin 09/08/2021   Hypertension    Pulmonary edema 09/08/2021   Stress incontinence    Type 2 diabetes mellitus (HCC)     Past Surgical History:  Procedure Laterality Date   I & D EXTREMITY Right 01/21/2014   Procedure: IRRIGATION AND DEBRIDEMENT RIGHT INDEX FINGER;  Surgeon: Franky Curia, MD;  Location: [facility] OR;  Service: Orthopedics;  Laterality: Right;    Medications  Prior to Admission  Medication Sig Dispense Refill Last Dose/Taking   acetaminophen  (TYLENOL ) 500 MG tablet Take 1 tablet (500 mg total) by mouth every 6 (six) hours as needed. 30 tablet 0    amoxicillin -clavulanate (AUGMENTIN ) 875-125 MG tablet Take 1 tablet by mouth every 12 (twelve) hours. (Patient not taking: Reported on 11/15/2023) 14 tablet 0    carvedilol  (COREG ) 3.125 MG tablet Take 1 tablet (3.125 mg total) by mouth 2 (two) times daily. 60 tablet 0    chlorhexidine  (PERIDEX ) 0.12 % solution Use as directed 15 mLs in the mouth or throat 2 (two) times daily. (Patient not taking: Reported on 11/15/2023) 120 mL 0    empagliflozin  (JARDIANCE ) 10 MG TABS tablet Take 10 mg by mouth.      furosemide  (LASIX ) 20 MG tablet Take 1 tablet (20 mg total) by mouth as needed for fluid or edema. Weight gain of 3 lb in 24 hours or 5 lb in a week. 30 tablet 3    magic mouthwash (nystatin , lidocaine , diphenhydrAMINE, alum & mag hydroxide) suspension Swish and spit 5 mLs 4 (four) times daily as needed for mouth pain. (Patient not taking: Reported on 11/15/2023) 180 mL 0    Multiple Vitamins-Minerals (MULTIVITAMIN GUMMIES ADULT) CHEW Chew 2 each by mouth daily.      sacubitril -valsartan  (ENTRESTO ) 49-51 MG Take 1 tablet by mouth 2 (two) times daily. 60 tablet 11    spironolactone  (ALDACTONE ) 25 MG tablet Take 1 tablet (25 mg total) by mouth daily.  60 tablet 0    VENTOLIN  HFA 108 (90 Base) MCG/ACT inhaler Inhale 2 puffs into the lungs every 6 (six) hours as needed.      No Known Allergies  Social History   Tobacco Use   Smoking status: Every Day    Current packs/day: 1.50    Average packs/day: 1.5 packs/day for 45.7 years (68.6 ttl pk-yrs)    Types: Cigarettes    Start date: 1980   Smokeless tobacco: Never  Substance Use Topics   Alcohol use: Yes    Comment: 3 times a week    Family History  Problem Relation Age of Onset   Heart disease Mother        had a pacemaker     Review of Systems Review of  systems not obtained due to patient factors.  Objective:   Patient Vitals for the past 8 hrs:  BP Temp Temp src Pulse Resp SpO2 Height Weight  01/03/24 0712 121/83 98.4 F (36.9 C) -- 71 (!) 30 99 % -- --  01/03/24 0709 127/87 98.4 F (36.9 C) -- 71 (!) 30 99 % -- --  01/03/24 0706 127/85 98.4 F (36.9 C) -- 71 (!) 30 99 % -- --  01/03/24 0703 130/86 98.4 F (36.9 C) -- 72 (!) 30 99 % -- --  01/03/24 0700 (!) 130/90 98.4 F (36.9 C) -- 72 (!) 30 100 % -- --  01/03/24 0657 128/86 98.4 F (36.9 C) -- 70 (!) 30 100 % -- --  01/03/24 0654 129/84 98.4 F (36.9 C) -- 70 (!) 30 100 % -- --  01/03/24 0651 133/87 98.2 F (36.8 C) -- 69 (!) 30 100 % -- --  01/03/24 0648 (!) 154/92 98.2 F (36.8 C) -- 70 (!) 30 100 % -- --  01/03/24 0645 (!) 164/103 98.2 F (36.8 C) -- 73 (!) 27 100 % -- --  01/03/24 0630 (!) 146/90 98.2 F (36.8 C) -- 71 (!) 22 99 % -- --  01/03/24 0627 (!) 151/90 98.2 F (36.8 C) -- 69 (!) 22 99 % -- --  01/03/24 0624 (!) 163/100 98.2 F (36.8 C) -- 69 (!) 22 100 % -- --  01/03/24 0621 (!) 162/102 98.2 F (36.8 C) -- 66 (!) 22 100 % -- --  01/03/24 0619 (!) 160/102 98.2 F (36.8 C) -- 64 (!) 22 100 % -- --  01/03/24 0615 (!) 161/98 98.2 F (36.8 C) -- 64 (!) 22 100 % -- --  01/03/24 0600 (!) 145/89 98.2 F (36.8 C) -- 64 (!) 22 100 % -- --  01/03/24 0557 (!) 138/94 98.2 F (36.8 C) -- 65 (!) 22 100 % -- --  01/03/24 0554 (!) 141/90 98.2 F (36.8 C) -- 65 (!) 22 100 % -- --  01/03/24 0551 (!) 133/91 98.2 F (36.8 C) -- 65 (!) 22 100 % -- --  01/03/24 0548 128/86 98.2 F (36.8 C) -- 65 (!) 22 100 % -- --  01/03/24 0545 129/84 98.2 F (36.8 C) -- 65 (!) 22 100 % -- --  01/03/24 0542 126/82 98.2 F (36.8 C) -- 65 (!) 22 100 % -- --  01/03/24 0539 116/81 98.2 F (36.8 C) -- 65 (!) 22 100 % -- --  01/03/24 0536 111/78 98.2 F (36.8 C) -- 65 (!) 22 100 % -- --  01/03/24 0533 107/77 98.2 F (36.8 C) -- 65 (!) 21 100 % -- --  01/03/24 0530 114/79 -- -- 65 20  100 % -- --  01/03/24 0527 115/76 -- -- -- -- -- -- --  01/03/24 0521 133/77 -- -- -- -- -- -- --  01/03/24 0518 134/81 -- -- -- -- -- -- --  01/03/24 0515 (!) 147/91 -- -- -- -- -- -- --  01/03/24 0512 135/87 -- -- -- -- -- -- --  01/03/24 0509 (!) 140/90 -- -- -- -- -- -- --  01/03/24 0506 (!) 148/94 -- -- 72 (!) 24 100 % -- --  01/03/24 0504 (!) 159/102 98.1 F (36.7 C) Bladder 77 (!) 21 93 % 5' 5 (1.651 m) 56.1 kg  01/03/24 0448 (!) 173/143 -- -- 72 15 100 % -- --  01/03/24 0440 122/76 98.5 F (36.9 C) -- 64 (!) 23 100 % -- --  01/03/24 0438 124/79 98.5 F (36.9 C) -- 63 (!) 23 100 % -- --  01/03/24 0436 138/82 98.6 F (37 C) -- 65 (!) 23 100 % -- --  01/03/24 0434 (!) 132/106 98.7 F (37.1 C) -- 64 (!) 24 100 % -- --  01/03/24 0430 (!) 126/101 98.7 F (37.1 C) -- 63 15 100 % -- --  01/03/24 0422 98/74 98.7 F (37.1 C) -- 66 18 100 % -- --  01/03/24 0420 100/70 98.6 F (37 C) -- 69 (!) 22 100 % -- --  01/03/24 0418 (!) 165/129 98.5 F (36.9 C) -- 71 (!) 21 100 % -- --  01/03/24 0417 (!) 120/97 98.4 F (36.9 C) -- 71 (!) 22 100 % -- --  01/03/24 0415 (!) 133/113 98.3 F (36.8 C) -- 69 19 100 % -- --  01/03/24 0402 -- -- -- -- -- 100 % -- --  01/03/24 0400 (!) 155/131 -- -- 79 16 100 % -- --  01/03/24 0354 (!) 176/110 -- -- 83 (!) 22 100 % -- --  01/03/24 0352 (!) 166/100 -- -- 84 17 100 % -- --  01/03/24 0350 (!) 168/111 -- -- 92 15 99 % -- --  01/03/24 0348 (!) 142/109 -- -- 77 (!) 24 100 % -- --  01/03/24 0345 131/85 -- -- 77 (!) 24 100 % -- --  01/03/24 0230 (!) 209/142 -- -- 80 (!) 21 100 % -- --  01/03/24 0130 (!) 219/126 -- -- 87 (!) 29 98 % -- --   I/O last 3 completed shifts: In: 116.3 [I.V.:116.3] Out: -  No intake/output data recorded.   Intubated Pupils pinpoint (recent fentanyl  administration) Eyes closed Minimal movement on left side, localizes on right side  Data ReviewCBC:  Lab Results  Component Value Date   WBC 8.3 01/03/2024   RBC 5.14 (H)  01/03/2024   Radiology review:   CT head, CTA and repeat CT head personally reviewed  Assessment:   Principal Problem:   ICH (intracerebral hemorrhage) (HCC) Active Problems:   Stroke, hemorrhagic (HCC)  ICH/IVH with hydrocephalus  Plan:  - EVD placed emergently at bedside - plan for repeat CT head tomorrow AM

## 2024-01-03 NOTE — Progress Notes (Signed)
 Echocardiogram 2D Echocardiogram has been performed.  Tinnie FORBES Gosling RDCS 01/03/2024, 11:23 AM

## 2024-01-03 NOTE — TOC CAGE-AID Note (Signed)
 Transition of Care [facility]) - CAGE-AID Screening   Patient Details  Name: Amanda [name] MRN: [id number] Date of Birth: 09/11/1960  [facility] CM/SW Contact:    Malacki Mcphearson E Lamar Meter, LCSW Phone Number: 01/03/2024, 10:14 AM   Clinical Narrative: Currently intubated.   CAGE-AID Screening: Substance Abuse Screening unable to be completed due to: : Patient unable to participate

## 2024-01-03 NOTE — Progress Notes (Signed)
 STROKE TEAM PROGRESS NOTE    SIGNIFICANT HOSPITAL EVENTS 9/21: Brought in by EMS due to confusion and lethargy.  Patient called EMS themselves.  Admitted to using cocaine approximately 3 hours before. 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  9/22.: Emergent Burr holes and right frontal ventriculostomy placed by neurosurgery due to neurological worsening.  INTERIM HISTORY/SUBJECTIVE No family at bedside during exam this morning.  RN at bedside reports patient with agitation this morning requiring sedation.  Poor exam related to sedating medications including propofol , fentanyl .  Patient remains on Cleviprex  drip as well.  As needed hydralazine  and labetalol  added for better blood pressure control.  Restart home meds. Ventriculostomy and draining well. OBJECTIVE CBC    Component Value Date/Time   WBC 8.3 01/03/2024 0145   RBC 5.14 (H) 01/03/2024 0145   HGB 13.3 01/03/2024 0440   HCT 39.0 01/03/2024 0440   PLT 276 01/03/2024 0145   MCV 88.7 01/03/2024 0145   MCH 29.0 01/03/2024 0145   MCHC 32.7 01/03/2024 0145   RDW 13.3 01/03/2024 0145   LYMPHSABS 1.9 01/03/2024 0145   MONOABS 0.6 01/03/2024 0145   EOSABS 0.1 01/03/2024 0145   BASOSABS 0.0 01/03/2024 0145   BMET    Component Value Date/Time   NA 139 01/03/2024 0440   K 4.0 01/03/2024 0440   CL 100 01/03/2024 0245   CO2 22 01/03/2024 0245   GLUCOSE 98 01/03/2024 0245   BUN 13 01/03/2024 0245   CREATININE 1.04 (H) 01/03/2024 0245   CREATININE 0.90 11/01/2013 1024   CALCIUM 9.0 01/03/2024 0245   GFRNONAA >60 01/03/2024 0245   GFRNONAA 74 11/01/2013 1024   Lab Results  Component Value Date   HGBA1C 6.6 (H) 01/03/2024   Lab Results  Component Value Date   CHOL 167 11/01/2013   HDL 48 11/01/2013   LDLCALC 68 11/01/2013   TRIG 255 (H) 11/01/2013   CHOLHDL 3.5 11/01/2013   Drugs of Abuse     Component Value Date/Time   LABOPIA NONE DETECTED 01/03/2024 0459   COCAINSCRNUR POSITIVE  (A) 01/03/2024 0459   LABBENZ NONE DETECTED 01/03/2024 0459   AMPHETMU NONE DETECTED 01/03/2024 0459   THCU NONE DETECTED 01/03/2024 0459   LABBARB NONE DETECTED 01/03/2024 0459    Urinalysis    Component Value Date/Time   COLORURINE YELLOW 01/03/2024 0459   APPEARANCEUR CLEAR 01/03/2024 0459   LABSPEC >1.046 (H) 01/03/2024 0459   PHURINE 6.0 01/03/2024 0459   GLUCOSEU 50 (A) 01/03/2024 0459   HGBUR NEGATIVE 01/03/2024 0459   BILIRUBINUR NEGATIVE 01/03/2024 0459   BILIRUBINUR Small 10/11/2013 1200   KETONESUR NEGATIVE 01/03/2024 0459   PROTEINUR 30 (A) 01/03/2024 0459   UROBILINOGEN 0.2 10/11/2013 1200   UROBILINOGEN 0.2 02/28/2008 1612   NITRITE NEGATIVE 01/03/2024 0459   LEUKOCYTESUR NEGATIVE 01/03/2024 0459   IMAGING past 24 hours CT HEAD WO CONTRAST ( ) Result Date: 01/03/2024 CLINICAL DATA:  63 year old female with altered mental status, lethargy, acute intracranial hemorrhage. EXAM: CT HEAD WITHOUT CONTRAST TECHNIQUE: Contiguous axial images were obtained from the base of the skull through the vertex without intravenous contrast. RADIATION DOSE REDUCTION: This exam was performed according to the departmental dose-optimization program which includes automated exposure control, adjustment of the mA and/or kV according to patient size and/or use of iterative reconstruction technique. COMPARISON:  Head CT and CTA head and neck earlier today. FINDINGS: Brain: Residual intravascular contrast. Hyperdense hemorrhage centered in the medial right deep gray nuclei at the thalamus. Intra-axial hematoma  roughly 32 x 25 x 33 mm (AP by transverse by CC), for estimated intra-axial blood volume of 13 mL. This compares to 11 mL 0220 hours today. Overall hematoma size and configuration not significantly changed. Surrounding edema. Stable regional mass effect. Small volume of intraventricular extension of blood redemonstrated. Increased conspicuity of small layering hematocrit in the occipital horns.  Lateral ventriculomegaly, stable. Third ventricle partially effaced, stable. Fourth ventricle is stable. No significant midline shift.  Basilar cisterns remain patent. Vascular related enhancement suspected in the contralateral left basal ganglia on series 2, image 16, appears stable from CTA images 0 234 hours today. And no mass effect or edema there. No convincing new intracranial hemorrhage. Vascular: Calcified atherosclerosis at the skull base. Residual intravascular contrast. Skull: Intact.  No acute osseous abnormality identified. Sinuses/Orbits: Visualized paranasal sinuses and mastoids are stable and well aerated. Other: Some retained secretions in the visible pharynx, intubated on chest radiograph today. No acute orbit or scalp soft tissue finding. IMPRESSION: 1. Medial Right thalamic hematoma, estimated 13 mL and slightly larger since 0220 hours today. Intraventricular blood volume not significantly changed. Stable ventricle size, and as before difficult to exclude a degree of acute lateral ventriculomegaly. 2. Residual intravascular contrast. Hyperdense left basal ganglia appears to be postcontrast enhancement and is stable from the CTA images 0234 hours today. 3. No new intracranial abnormality identified. Electronically Signed   By: VEAR Hurst M.D.   On: 01/03/2024 05:49   DG Chest Portable 1 View Result Date: 01/03/2024 CLINICAL DATA:  Ventilator dependent respiratory failure. Check intubation, NGT. EXAM: PORTABLE CHEST 1 VIEW COMPARISON:  CTA chest today at 2:27 a.m. FINDINGS: 3:59 a.m. ETT tip is 3.0 cm from the carina. NGT extends well into the stomach but neither the side-hole or tip are in view. Cardiomegaly.  No vascular congestion or edema are seen. Small right apical opacity consistent with infiltrate or scarring versus a subsolid nodule. Rest of the lungs are clear. The sulci are sharp. Mild aortic tortuosity and atherosclerosis. Otherwise unremarkable mediastinum.  Advanced thoracic  spondylosis. IMPRESSION: 1. ETT tip 3.0 cm from the carina. 2. NGT extends well into the stomach but neither the side-hole or tip are in view. 3. Small right apical opacity consistent with infiltrate or scarring versus a subsolid nodule. 4. Cardiomegaly. Electronically Signed   By: Francis Quam M.D.   On: 01/03/2024 04:14   CT Head Wo Contrast Result Date: 01/03/2024 EXAM: CTA Head and Neck with Intravenous Contrast. CT Head without Contrast. CLINICAL HISTORY: Mental status change, unknown cause. Near syncope. EMS called for lethargy and AMS. Pt reports cocaine use approx 3 hours ago. TECHNIQUE: Axial CTA images of the head and neck performed with intravenous contrast. MIP reconstructed images were created and reviewed. Axial computed tomography images of the head/brain performed without intravenous contrast. Note: Per PQRS, the description of internal carotid artery percent stenosis, including 0 percent or normal exam, is based on [place] Symptomatic Carotid Endarterectomy Trial (NASCET) criteria. Dose reduction technique was used including one or more of the following: automated exposure control, adjustment of mA and kV according to patient size, and/or iterative reconstruction. CONTRAST: 100 mL iohexol  (OMNIPAQUE ) 350 MG/ML injection. COMPARISON: None provided. 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 The hematoma measures 3.0 x 1.9 x 2.5 cm (approximately 7.5 mL). No herniation. VENTRICLES: Primarily hydrocephalus. ORBITS: The orbits are unremarkable. SINUSES AND MASTOIDS: The paranasal sinuses and mastoid air cells are clear. CTA NECK: COMMON CAROTID ARTERIES:  No significant stenosis. No dissection or occlusion. INTERNAL CAROTID ARTERIES: No hemodynamically significant stenosis by NASCET criteria. Mild atherosclerotic calcification of both carotid bifurcations. No dissection or occlusion. VERTEBRAL ARTERIES: Poor  visualization of the V1 segment of the left vertebral artery. Poor opacification of the V4 segment of the left vertebral artery. No significant stenosis. No dissection or occlusion. CTA HEAD: ANTERIOR CEREBRAL ARTERIES: No significant stenosis. No occlusion. No aneurysm. MIDDLE CEREBRAL ARTERIES: No significant stenosis. No occlusion. No aneurysm. POSTERIOR CEREBRAL ARTERIES: No significant stenosis. No occlusion. No aneurysm. BASILAR ARTERY: No significant stenosis. No occlusion. No aneurysm. OTHER: Focal opacity in the right lung apex better characterized on concomitant chest CT. SOFT TISSUES: No acute finding. No masses or lymphadenopathy. BONES: No acute osseous abnormality. IMPRESSION: 1. 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 No herniation. 2. No emergent large vessel occlusion. 3. Poor opacification of the v1 and v4 segments of the left vertebral artery. 4. Mild atherosclerotic calcification of both carotid bifurcations without hemodynamically significant stenosis. 5. Chronic ischemic white matter changes. Findings discussed with Dr. Garnette Halo at 2:58 AM on 01/03/2024. Electronically signed by: Franky Stanford MD 01/03/2024 02:59 AM EDT RP Workstation: [id number]   CT ANGIO HEAD NECK W WO CM Result Date: 01/03/2024 EXAM: CTA Head and Neck with Intravenous Contrast. CT Head without Contrast. CLINICAL HISTORY: Mental status change, unknown cause. Near syncope. EMS called for lethargy and AMS. Pt reports cocaine use approx 3 hours ago. TECHNIQUE: Axial CTA images of the head and neck performed with intravenous contrast. MIP reconstructed images were created and reviewed. Axial computed tomography images of the head/brain performed without intravenous contrast. Note: Per PQRS, the description of internal carotid artery percent stenosis, including 0 percent or normal  exam, is based on [place] Symptomatic Carotid Endarterectomy Trial (NASCET) criteria. Dose reduction technique was used including one or more of the following: automated exposure control, adjustment of mA and kV according to patient size, and/or iterative reconstruction. CONTRAST: 100 mL iohexol  (OMNIPAQUE ) 350 MG/ML injection. COMPARISON: None provided. 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 The hematoma measures 3.0 x 1.9 x 2.5 cm (approximately 7.5 mL). No herniation. VENTRICLES: Primarily hydrocephalus. ORBITS: The orbits are unremarkable. SINUSES AND MASTOIDS: The paranasal sinuses and mastoid air cells are clear. CTA NECK: COMMON CAROTID ARTERIES: No significant stenosis. No dissection or occlusion. INTERNAL CAROTID ARTERIES: No hemodynamically significant stenosis by NASCET criteria. Mild atherosclerotic calcification of both carotid bifurcations. No dissection or occlusion. VERTEBRAL ARTERIES: Poor visualization of the V1 segment of the left vertebral artery. Poor opacification of the V4 segment of the left vertebral artery. No significant stenosis. No dissection or occlusion. CTA HEAD: ANTERIOR CEREBRAL ARTERIES: No significant stenosis. No occlusion. No aneurysm. MIDDLE CEREBRAL ARTERIES: No significant stenosis. No occlusion. No aneurysm. POSTERIOR CEREBRAL ARTERIES: No significant stenosis. No occlusion. No aneurysm. BASILAR ARTERY: No significant stenosis. No occlusion. No aneurysm. OTHER: Focal opacity in the right lung apex better characterized on concomitant chest CT. SOFT TISSUES: No acute finding. No masses or lymphadenopathy. BONES: No acute osseous abnormality. IMPRESSION: 1. 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 No herniation. 2. No emergent large vessel occlusion. 3. Poor  opacification of the v1 and v4 segments of the left vertebral artery. 4. Mild atherosclerotic calcification of both carotid bifurcations without hemodynamically significant  stenosis. 5. Chronic ischemic white matter changes. Findings discussed with Dr. Garnette Halo at 2:58 AM on 01/03/2024. Electronically signed by: Franky Stanford MD 01/03/2024 02:59 AM EDT RP Workstation: [id number]   CT Angio Chest/Abd/Pel for Dissection W and/or Wo Contrast Result Date: 01/03/2024 EXAM: CTA CHEST, ABDOMEN AND PELVIS WITH AND WITHOUT CONTRAST 01/03/2024 02:45:00 AM TECHNIQUE: CTA of the chest was performed with and without the administration of intravenous contrast. CTA of the abdomen and pelvis was performed with and without the administration of intravenous contrast. Multiplanar reformatted images are provided for review. MIP images are provided for review. Automated exposure control, iterative reconstruction, and/or weight based adjustment of the mA/kV was utilized to reduce the radiation dose to as low as reasonably achievable. COMPARISON: CTA chest dated 09/09/2021. CLINICAL HISTORY: Acute aortic syndrome (AAS) suspected. Near syncope. EMS called for lethargy and AMS. Pt reports cocaine use approx 3 hours ago. FINDINGS: VASCULATURE: AORTA: No thoracoabdominal aortic aneurysm or dissection. PULMONARY ARTERIES: No pulmonary embolism within the limits of this exam. GREAT VESSELS OF AORTIC ARCH: No acute finding. No dissection. No arterial occlusion or significant stenosis. CELIAC TRUNK: No acute finding. No occlusion or significant stenosis. SUPERIOR MESENTERIC ARTERY: No acute finding. No occlusion or significant stenosis. INFERIOR MESENTERIC ARTERY: No acute finding. No occlusion or significant stenosis. RENAL ARTERIES: No acute finding. No occlusion or significant stenosis. ILIAC ARTERIES: No acute finding. No occlusion or significant stenosis. CHEST: MEDIASTINUM: Mild cardiomegaly. No mediastinal lymphadenopathy. The heart  and pericardium demonstrate no acute abnormality. LUNGS AND PLEURA: Mild patchy opacities in the posterior upper and lower lobes, favoring atelectasis. Mild centrilobular emphysematous changes, upper lung predominant. 2.4 cm right apical opacity (image 17), favoring pleural parenchymal scarring on coronal imaging, but new from remote prior. Correlate for radiation changes to this region. Otherwise, consider follow up CT chest in 3 months. No evidence of pleural effusion or pneumothorax. THORACIC BONES AND SOFT TISSUES: Mild degenerative changes of the lower thoracic spine. No acute soft tissue abnormality. ABDOMEN AND PELVIS: LIVER: The liver is unremarkable. GALLBLADDER AND BILE DUCTS: Gallbladder is unremarkable. No biliary ductal dilatation. SPLEEN: The spleen is unremarkable. PANCREAS: The pancreas is unremarkable. ADRENAL GLANDS: Bilateral adrenal glands demonstrate no acute abnormality. KIDNEYS, URETERS AND BLADDER: 3.0 cm simple left upper pole renal cyst, benign (Bosniak 1). No stones in the kidneys or ureters. No hydronephrosis. No perinephric or periureteral stranding. Urinary bladder is unremarkable. GI AND BOWEL: Normal appendix. Stomach and duodenal sweep demonstrate no acute abnormality. There is no bowel obstruction. No abnormal bowel wall thickening or distension. REPRODUCTIVE: Uterus is within normal limits. PERITONEUM AND RETROPERITONEUM: No ascites or free air. LYMPH NODES: No lymphadenopathy. ABDOMINAL BONES AND SOFT TISSUES: No acute abnormality of the bones. No acute soft tissue abnormality. IMPRESSION: 1. No thoracoabdominal aortic aneurysm or dissection. 2. Suspected right apical pleural parenchymal scarring, new from prior. Correlate for radiation changes to this region. Otherwise, consider follow-up CT chest in 3 months. Electronically signed by: Pinkie Pebbles MD 01/03/2024 02:56 AM EDT RP Workstation: [id number]   DG Chest Portable 1 View Result Date: 01/03/2024 EXAM: 1 VIEW XRAY OF  THE CHEST 01/03/2024 02:19:00 AM COMPARISON: 05/12/2023 CLINICAL HISTORY: cp. ams FINDINGS: LUNGS AND PLEURA: No focal pulmonary opacity. No frank interstitial edema. No pleural effusion. No pneumothorax. HEART AND MEDIASTINUM: Cardiomegaly. BONES AND SOFT TISSUES: No acute osseous abnormality. IMPRESSION: 1. No acute abnormalities. 2. Cardiomegaly. No frank interstitial edema. Electronically signed by: Pinkie Pebbles MD 01/03/2024 02:22 AM EDT RP Workstation: [id number]   Vitals:  01/03/24 0703 01/03/24 0706 01/03/24 0709 01/03/24 0712  BP: 130/86 127/85 127/87 121/83  Pulse: 72 71 71 71  Resp: (!) 30 (!) 30 (!) 30 (!) 30  Temp: 98.4 F (36.9 C) 98.4 F (36.9 C) 98.4 F (36.9 C) 98.4 F (36.9 C)  TempSrc:      SpO2: 99% 99% 99% 99%  Weight:      Height:       PHYSICAL EXAM General: Critically ill-appearing patient laying in ICU bed.  EVD in place.  Intubated and sedated. Psych: Unable to assess due to patient's condition CV: Regular rate and rhythm on monitor Respiratory: Respirations assisted via mechanical ventilation.  Oral ETT secured with securement of eyes. GI: Abdomen soft and nontender  NEURO:  Mental Status: Intubated and sedated.  Patient does not open eyes.  Patient does not participate with exam.  She does not follow commands.  Speech/Language: Unable to assess due to patient's condition.  Cranial Nerves:  II: Does not blink to threat throughout.  Pupils are pinpoint and sluggish III, IV, VI: Does not fixate or track. VII: Obscured due to ETT and securement device. VIII: Able to assess, patient does not respond to voice. IX, X: Gag are intact. XI: Head is grossly midline. XII: Does not protrude tongue to command. Motor/Sensory:  Patient withdraws to pain on the right upper and lower extremity in the left lower extremity.  Left upper extremity is flaccid.  Coordination: Does not perform Gait: Deferred for patient safety  Most Recent NIH 23    ASSESSMENT/PLAN Ms. CELINDA DETHLEFS is a 64 y.o. female with history of cocaine abuse, CHF, CKD, HTN and DM2 BIB EMS with confusion and lethargy. BP was 209/142 on arrival. CT head revealed an acute right gangliothalamic intraparenchymal hemorrhage with intraventricular extension and associated early hydrocephalus,  admitted for ICU monitoring and critical care.  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 No herniation. Chronic ischemic white matter changes.   CTA head & neck  No emergent large vessel occlusion. Poor opacification of the v1 and v4 segments of the left vertebral artery. Mild atherosclerotic calcification of both carotid bifurcations without hemodynamically significant stenosis.   Repeat CTH:  Medial Right thalamic hematoma, estimated 13 mL and slightly larger since 0220 hours today. Intraventricular blood volume not significantly changed. Stable ventricle size, and as before difficult to exclude a degree of acute lateral ventriculomegaly. MRI: PENDING UDS: Positive for Cocaine 2D Echo: PENDING LDL ordered, pending HgbA1c 6.6 VTE prophylaxis - SCDs No antithrombotic prior to admission, now on No antithrombotic due to ICH Therapy recommendations:  Pending Disposition:  PENDING  Hydrocephalus S/p ventriculostomy Emergent evd placed 9/22 am by Neurosurgery Repeat imaging pending- MRI AM  Acute respiratory failure secondary to ICH CCM management, appreciate assistance Currently on propofol  Wean sedation as tolerated  Hypertension Hx of CHF Hx of Pulmonary Edema Home meds: Carvedilol  3.125 mg twice daily, Lasix  20 mg as needed, Entresto  twice daily, spironolactone  25 mg  daily Unstable Continue Cleviprex  gtt, wean as tolerated Restart home antihypertensives: Entresto , spironolactone , Coreg  As needed hydralazine  and labetalol  added for better blood pressure control Blood Pressure Goal: SBP between 130-150 for 24 hours and then less than 160   Hyperlipidemia Home meds:  none LDL ordered, goal < 70 Consider  statin at discharge as needed  Diabetes type II,  Home meds:  Jardiance , no insulin  use HgbA1c 6.6, goal < 7.0 CBGs, SSI Recommend close follow-up with PCP   Tobacco Abuse Patient is a current smoker      Ready to quit? N/A Nicotine  replacement therapy provided  Substance Abuse Patient uses crack cocaine UDS positive for Cocaine      Ready to quit? N/A TOC consult for cessation placed  Dysphagia Patient has post-stroke dysphagia, SLP consulted    Diet   Diet NPO time specified   Advance diet as tolerated CorTrak to be placed today  Other Active Problems   Hospital day # 0  Mimi Ny, AGACNP-BC [facility] Pager: [phone]  I have personally obtained history,examined this patient, reviewed notes, independently viewed imaging studies, participated in medical decision making and plan of care.ROS completed by me personally and pertinent positives fully documented  I have made any additions or clarifications directly to the above note. Agree with note above.  Patient with hypertensive right thalamic hemorrhage with intraventricular extension with cocaine usage.  She required emergent ventriculostomy but neurological exam was not improved and patient has required IV sedation for agitation and ventilatory support for respiratory failure.  Neurological exam is compromised by sedation but patient has purposeful movements of the right dense left hemiplegia.  Continue strict blood pressure control with systolic goal between 130-150 for the first 24 hours and then below 160.  Wean sedation as tolerated ventilatory support as  per critical care team.  Discussed with Dr. Claudene critical care medicine.  No family available at bedside. This patient is critically ill and at significant risk of neurological worsening, death and care requires constant monitoring of vital signs, hemodynamics,respiratory and cardiac monitoring, extensive review of multiple databases, frequent neurological assessment, discussion with family, other specialists and medical decision making of high complexity.I have made any additions or clarifications directly to the above note.This critical care time does not reflect procedure time, or teaching time or supervisory time of PA/NP/Med Resident etc but could involve care discussion time.  I spent 30 minutes of neurocritical care time  in the care of  this patient.     Eather Popp, MD Medical Director [facility] Pager: [phone] 01/03/2024 4:22 PM  To contact Stroke Continuity provider, please refer to [email or web address]. After hours, contact General Neurology

## 2024-01-03 NOTE — Progress Notes (Signed)
 Patients EVD has decreased in out put over the last 2 hours. I reached out to [facility] and Neuro Surgery. Confirmed the EVD is still pulsating, and no neuro change noted. The medical team was also informed that the per tube BP meds have lowered the BP and now I am outside of the parameters. It was also relayed that the sedation is at a very low dose. NeuroSurg changed the BP parameters SBP 120-150. CCM placed orders for LEVO and PICC to keep BP within range.   Rian Bohr RN, Scientist, research (physical sciences), TCRN

## 2024-01-03 NOTE — Progress Notes (Signed)
 Peripherally Inserted Central Catheter Placement  The IV Nurse has discussed with the patient and/or persons authorized to consent for the patient, the purpose of this procedure and the potential benefits and risks involved with this procedure.  The benefits include less needle sticks, lab draws from the catheter, and the patient may be discharged home with the catheter. Risks include, but not limited to, infection, bleeding, blood clot (thrombus formation), and puncture of an artery; nerve damage and irregular heartbeat and possibility to perform a PICC exchange if needed/ordered by physician.  Alternatives to this procedure were also discussed.  Bard Power PICC patient education guide, fact sheet on infection prevention and patient information card has been provided to patient /or left at bedside.  PICC inserted by Maryalice Nephew, RN.  Telephone consent obtained from daughter due to altered mental status.  PICC Placement Documentation  PICC Triple Lumen 01/03/24 Right Brachial 37 cm 0 cm (Active)  Indication for Insertion or Continuance of Line Vasoactive infusions 01/03/24 1931  Exposed Catheter (cm) 0 cm 01/03/24 1931  Site Assessment Clean, Dry, Intact 01/03/24 1931  Lumen #1 Status Flushed;Saline locked;Blood return noted 01/03/24 1931  Lumen #2 Status Flushed;Saline locked;Blood return noted 01/03/24 1931  Lumen #3 Status Flushed;Saline locked;Blood return noted 01/03/24 1931  Dressing Type Transparent;Securing device 01/03/24 1931  Dressing Status Antimicrobial disc/dressing in place;Clean, Dry, Intact 01/03/24 1931  Line Care Connections checked and tightened 01/03/24 1931  Line Adjustment (NICU/IV Team Only) No 01/03/24 1931  Dressing Intervention New dressing;Adhesive placed at insertion site (IV team only) 01/03/24 1931  Dressing Change Due 01/10/24 01/03/24 1931       Letzy Gullickson, Cherene Place 01/03/2024, 7:31 PM

## 2024-01-03 NOTE — ED Notes (Addendum)
 Patient intubated by EDP Garnette Halo, MD. Lung sounds clear and color change.

## 2024-01-03 NOTE — H&P (Signed)
 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 female with a PMHx of cocaine abuse, CHF, CKD, chronic cough, HTN and DM2 who was BIB EMS with confusion and lethargy. The patient had called EMS. She told EMS that she used cocaine about 3 hours prior to her symptom onset. She was only oriented to person on arrival to the ED. Her mentation was worse on arrival to the ED relative to her level of communication when EMS had arrived to her residence. She was able to deny headache, CP, shortness of breath, abdominal pain, nausea and vomiting. Her SBP was 220 with EMS and 209/142 on arrival, with RR 22, HR 80 and 100% oxygen saturations.   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  Neurosurgery was called and did not feel that emergent placement of a ventricular drain was indicated. They requested a repeat CT at 7 AM.   The patient's mentation then worsened in the ED and she was emergently intubated. She has been transported to 4N. Exam reveals round and reactive from 3 mm to 2 mm bilaterally, left hemiplegia intact cough and gag reflexes, and weakly reactive left corneal reflex.    ROS  Unable to obtain due to sedation on baseline lethargy noted in the ED prior to intubation.    Past History   Past Medical History:  Diagnosis Date   Chronic cough    Chronic kidney disease    Congestive heart failure (CHF) (HCC)    Elevated troponin 09/08/2021   Hypertension    Pulmonary edema 09/08/2021   Stress incontinence    Type 2 diabetes mellitus (HCC)      Past Surgical History:  Procedure Laterality Date   I & D EXTREMITY Right 01/21/2014   Procedure: IRRIGATION AND DEBRIDEMENT RIGHT INDEX FINGER;  Surgeon: Franky Curia, MD;  Location: [facility] OR;  Service: Orthopedics;  Laterality: Right;    Family History: Family History  Problem Relation Age of Onset   Heart disease Mother        had a pacemaker    Social History  reports that she has been smoking cigarettes. She started smoking about 45 years ago. She has a 68.6 pack-year smoking history. She has never used smokeless tobacco. She reports current alcohol use. She reports that she does not currently use drugs after having used the following drugs: Crack cocaine.  No Known Allergies  Medications   Current Facility-Administered Medications:    clevidipine  (CLEVIPREX ) infusion 0.5 mg/mL, 0-21 mg/hr, Intravenous, Continuous, Rancour, Stephen, MD, Last Rate: 8 mL/hr at 01/03/24 0244, 4 mg/hr at 01/03/24 0244  Current Outpatient Medications:    acetaminophen  (TYLENOL ) 500 MG tablet, Take 1 tablet (500 mg total) by mouth every 6 (six) hours as needed., Disp: 30 tablet, Rfl: 0   amoxicillin -clavulanate (AUGMENTIN ) 875-125 MG tablet, Take 1 tablet by mouth every 12 (twelve) hours. (Patient not taking: Reported on 11/15/2023), Disp: 14 tablet, Rfl: 0   carvedilol  (COREG ) 3.125 MG tablet, Take 1 tablet (3.125 mg total) by mouth 2 (two) times daily., Disp: 60 tablet, Rfl: 0   chlorhexidine  (PERIDEX ) 0.12 % solution, Use as directed 15 mLs  in the mouth or throat 2 (two) times daily. (Patient not taking: Reported on 11/15/2023), Disp: 120 mL, Rfl: 0   empagliflozin  (JARDIANCE ) 10 MG TABS tablet, Take 10 mg by mouth., Disp: , Rfl:    furosemide  (LASIX ) 20 MG tablet, Take 1 tablet (20 mg total) by mouth as needed for fluid or edema. Weight gain of 3 lb in 24 hours or 5 lb in a week., Disp: 30 tablet, Rfl: 3   magic mouthwash (nystatin , lidocaine , diphenhydrAMINE, alum & mag hydroxide) suspension, Swish  and spit 5 mLs 4 (four) times daily as needed for mouth pain. (Patient not taking: Reported on 11/15/2023), Disp: 180 mL, Rfl: 0   Multiple Vitamins-Minerals (MULTIVITAMIN GUMMIES ADULT) CHEW, Chew 2 each by mouth daily., Disp: , Rfl:    sacubitril -valsartan  (ENTRESTO ) 49-51 MG, Take 1 tablet by mouth 2 (two) times daily., Disp: 60 tablet, Rfl: 11   spironolactone  (ALDACTONE ) 25 MG tablet, Take 1 tablet (25 mg total) by mouth daily., Disp: 60 tablet, Rfl: 0   VENTOLIN  HFA 108 (90 Base) MCG/ACT inhaler, Inhale 2 puffs into the lungs every 6 (six) hours as needed., Disp: , Rfl:   Vitals   Vitals:   January 19, 2024 0130 01/19/2024 0230  BP: (!) 219/126 (!) 209/142  Pulse: 87 80  Resp: (!) 29 (!) 21  SpO2: 98% 100%    There is no height or weight on file to calculate BMI.   Physical Exam   Constitutional: Appears well-developed and well-nourished.  Eyes: No scleral injection.  HENT: Intubated Head: Normocephalic. No neck stiffness Cardiac: RRR Respiratory: Mechanically ventilated.  GI: Soft.  No distension.  Skin: WDI.   Neurologic Examination  Exam while intubated with propofol  held reveals left hemiparesis and AMS. Pupils are 2 mm and sluggishly reactive. Brisk corneal on the right, weak corneal on the left, eyes are conjugate, coughs and overbreathes the vent.  Mental Status: Intubated and sedated on propofol  at a rate of 30. Propofol  held for exam. Patient is stuporous with residual sedation on board. Unable to follow any commands. Does not open eyes Alert, oriented x 5, thought content appropriate.  Speech fluent without evidence of aphasia.  Able to follow all commands without difficulty. Cranial Nerves: II: Temporal visual fields intact with no extinction to DSS. PERRL  III,IV, VI: No ptosis. EOMI.  V: Temp sensation equal bilaterally  VII: Smile symmetric VIII: Hearing intact to voice IX,X: No hoarseness XI: Symmetric shoulder shrug XII: Midline tongue extension Motor: BUE 5/5  proximally and distally BLE 5/5 proximally and distally  No pronator drift.  Sensory: Temp and light touch intact throughout, bilaterally. No extinction to DSS.  Deep Tendon Reflexes: 2+ and symmetric throughout Cerebellar: No ataxia with FNF bilaterally  Gait: Deferred    Labs/Imaging/Neurodiagnostic studies   CBC:  Recent Labs  Lab Jan 19, 2024 0145 01-19-24 0153  WBC 8.3  --   NEUTROABS 5.7  --   HGB 14.9 16.0*  HCT 45.6 47.0*  MCV 88.7  --   PLT 276  --    Basic Metabolic Panel:  Lab Results  Component Value Date   NA 136 Jan 19, 2024   K 6.0 (H) 01/19/2024   CO2 23 05/14/2023   GLUCOSE 107 (H) 2024/01/19   BUN 16 01-19-2024   CREATININE 1.00 01/19/2024   CALCIUM 9.2 05/14/2023   GFRNONAA >60 05/14/2023   GFRAA >90 01/21/2014   Lipid Panel:  Lab Results  Component Value Date   LDLCALC 68 11/01/2013   HgbA1c:  Lab Results  Component Value Date   HGBA1C 7.2 (H) 05/12/2023   Urine Drug Screen:     Component Value Date/Time   LABOPIA NONE DETECTED 05/12/2023 0510   COCAINSCRNUR POSITIVE (A) 05/12/2023 0510   LABBENZ NONE DETECTED 05/12/2023 0510   AMPHETMU NONE DETECTED 05/12/2023 0510   THCU NONE DETECTED 05/12/2023 0510   LABBARB NONE DETECTED 05/12/2023 0510    Alcohol Level     Component Value Date/Time   [facility] <15 01/03/2024 0145   INR  Lab Results  Component Value Date   INR 1.1 05/29/2022     ASSESSMENT  Amanda [name] is a 63 y.o. female with a PMHx of cocaine abuse, CHF, CKD, chronic cough, HTN and DM2 who was BIB EMS with confusion and lethargy. The patient had called EMS. She told EMS that she used cocaine about 3 hours prior to her symptom onset. She was only oriented to person on arrival to the ED. Her mentation was worse on arrival to the ED relative to her level of communication when EMS had arrived to her residence. She was able to deny headache, CP, shortness of breath, abdominal pain, nausea and vomiting. Her SBP was 220 with EMS and  209/142 on arrival, with RR 22, HR 80 and 100% oxygen saturations. 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 - Exam while intubated with propofol  held reveals left hemiparesis and AMS. Pupils are 2 mm and sluggishly reactive. Brisk corneal on the right, weak corneal on the left, eyes are conjugate, coughs and overbreathes the vent.  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 No herniation. Chronic ischemic white matter changes.  - CTA of head and neck: No emergent large vessel occlusion. Poor opacification of the v1 and v4 segments of the left vertebral artery. Mild atherosclerotic calcification of both carotid bifurcations without hemodynamically significant stenosis.   - EKG: Sinus rhythm; Biatrial enlargement; Left ventricular hypertrophy; Nonspecific T abnrm, anterolateral leads; Borderline prolonged QT interval - CXR: No acute abnormalities. Cardiomegaly. No frank interstitial edema. - CTA of C/A/P: No thoracoabdominal aortic aneurysm or dissection. Suspected right apical pleural parenchymal scarring, new from prior. - Labs:  - Na and repeat K normal. BUN and Cr normal on arrival. Glucose 107 - 98.  - BNP elevated at 370.8. Troponin elevated at 34 - WBC, platelets and Hgb normal.  - TSH low at 0.262 - UDS in January was positive for cocaine. UDS obtained at 0459 today also positive for cocaine.  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   - Malignant HTN.  - Both the hemorrhage and her HTN are interrelated, with initial ICH most likely due to a severe spike in her BP  secondary to cocaine use.    RECOMMENDATIONS  - Admitted to the ICU under Neurology service - CCM has been consulted for ventilator management - Monitoring and management decisions pertaining to her elevated BNP and troponin per CCM. Also requesting further assessment regarding her low TSH level.  - Neurosurgery has been consulted for possible ventricular drain placement. Contacted again after repeat CT and Neurosurgeon is coming in to place ventricular drain.  - No indication for hypertonic saline at this time as there is no significant  edema adjacent to the hemorrhage.  - MRI/MRA of head - Carotid ultrasound - TTE - PT consult, OT consult, Speech consult, when patient is able to participate - Cardiac telemetry - Frequent neuro checks. Assessment with special attention to pupil checks - the main concern is development of hydrocephalus and increased ICP with possible Cushing response (bradycardia, worsened HTN and Cheyne-Stokes breathing)   - BP management with clevidipine  drip  - No antiplatelet medications or anticoagulants - DVT prophylaxis with SCDs  Addendum: Repeat CT head at 0529: Medial Right thalamic hematoma, estimated 13 mL and slightly larger since 0220 hours today. Intraventricular blood volume not significantly changed. Stable ventricle size, and as before difficult to exclude a degree of acute lateral ventriculomegaly. Residual intravascular contrast. Hyperdense left basal ganglia appears to be postcontrast enhancement and is stable from the CTA images 0234 hours today.    CRITICAL CARE Performed by: MERRIANNE, Janita Camberos  Total critical care time: 70 minutes  Critical care time was exclusive of separately billable procedures and treating other patients.  Critical care was necessary to treat or prevent imminent or life-threatening deterioration.  Critical care was time spent personally by me on the following activities: development of treatment plan with patient and/or surrogate  as well as nursing, discussions with consultants, evaluation of patient's response to treatment, examination of patient, obtaining history from patient or surrogate, ordering and performing treatments and interventions, ordering and review of laboratory studies, ordering and review of radiographic studies, pulse oximetry and re-evaluation of patient's condition.  ______________________________________________________________________    Bonney MERRIANNE, Jad Johansson, MD [facility]

## 2024-01-03 NOTE — Progress Notes (Signed)
 eLink Physician-Brief Progress Note Patient Name: Amanda [name] DOB: Aug 17, 1960 MRN: [id number]   Date of Service  01/03/2024  HPI/Events of Note  81 F hx of combined HF EF 30%, HTN, DM, polysubstance abuse. She presented with altered sensorium. BP of 220/126. She then became more lethargic in the ED and was intubated. Head CT with right thalamic ICH with IVH and midline shift and early hydrocephalus.  eICU Interventions  Hypertensive emergency on Cleviprex  ICH secondary to cocaine use and uncontrolled hypertension. Head CT to be repeated. Neurosurgery consulted Discussed with [facility]     Intervention Category Evaluation Type: New Patient Evaluation  Amanda [name] 01/03/2024, 5:05 AM

## 2024-01-03 NOTE — Procedures (Signed)
 PREOP DX: Hydrocephalus  POSTOP DX: Hydrocephalus  PROCEDURE: Right frontal ventriculostomy   SURGEON: Dr. Dorn Ned, MD  ANESTHESIA: IV Sedation (versed  and fentanyl ) with Local  EBL: Minimal  SPECIMENS: None  COMPLICATIONS: None  CONDITION: Hemodynamically stable  INDICATIONS: Mrs. Amanda [name] is a 63 y.o. female with with history of polysubstance abuse who developed a right thalamic ICH with IVH after recent cocaine use.  She presented with mild confusion but was alert.  Initial CT head showed early hydrocephalus as well as congenital cranial dysmorphia.  She was monitored in the ICU and per report no longer was following commands and required intubation.  On my examination, patient was intubated, briskly localizing on the right side with a strong cough, with eyes closed.  There was no family that could be contacted so we elected to proceed with emergent ventriculostomy placement as in the face of neurologic worsening, the  benefits of the procedure outweighed the potential risks.  PROCEDURE IN DETAIL: After consent was obtained from the patient's family, skin of the right frontal scalp was clipped, prepped and draped in the usual sterile fashion.  Scalp was then infiltrated with local anesthetic with epinephrine.  Skin incision was made sharply, and twist drill burr hole was made.  The dura was then incised, and the ventricular catheter was passed into the area lateral ventricle cranial.  Good slightly blood-tinged CSF flow was obtained.  The catheter was then tunneled subcutaneously and connected to a drainage system and the skin incision closed.  The drain was then secured in place.  FINDINGS: Opening pressure 15 cm H2O

## 2024-01-03 NOTE — Procedures (Signed)
 Arterial Catheter Insertion Procedure Note  MYRON LONA  [id number]  1961-04-06  Date:01/03/24  Time:9:22 AM    Provider Performing: Toribio JAYSON Sharps    Procedure: Insertion of Arterial Line (63379) without US  guidance  Indication(s) Blood pressure monitoring and/or need for frequent ABGs  Consent Unable to obtain consent due to emergent nature of procedure.  Anesthesia None   Time Out Verified patient identification, verified procedure, site/side was marked, verified correct patient position, special equipment/implants available, medications/allergies/relevant history reviewed, required imaging and test results available.   Sterile Technique Maximal sterile technique including full sterile barrier drape, hand hygiene, sterile gown, sterile gloves, mask, hair covering, sterile ultrasound probe cover (if used).   Procedure Description Area of catheter insertion was cleaned with chlorhexidine  and draped in sterile fashion. Without real-time ultrasound guidance an arterial catheter was placed into the right radial artery.  Appropriate arterial tracings confirmed on monitor.     Complications/Tolerance None; patient tolerated the procedure well.   EBL Minimal   Specimen(s) None

## 2024-01-03 NOTE — Progress Notes (Signed)
 PT Cancellation Note  Patient Details Name: Amanda [name] MRN: [id number] DOB: [birth year]   Cancelled Treatment:    Reason Eval/Treat Not Completed: Active bedrest order;Patient not medically ready - will check back.  [name] S, PT DPT Acute Rehabilitation Services Secure Chat Preferred  Office [phone]    Amanda [name] [place] 01/03/2024, 9:30 AM

## 2024-01-03 NOTE — ED Notes (Signed)
 Patient transported to CT

## 2024-01-03 NOTE — Progress Notes (Signed)
 Pt transported from ED25 to 4N21 without complications.

## 2024-01-03 NOTE — Progress Notes (Signed)
 63 yo F hx of polysubstance abuse developed right thalamic ICH with IVH after recent cocaine use.  She has early HCP, though no baseline head imaging available.  She has confusion but is alert and awake, MAEs.  Recommend neuro ICU admission, blood pressure control, short-term f/u repeat CT  head (7 am). Would need EVD if neurologic worsening of level consciousness, and strong consideration for placement if worsening radiographically as at that point prophylactic benefit may exceed the risk of the procedure.

## 2024-01-03 NOTE — Progress Notes (Signed)
 Pt transported from 4N21 to CT and back without complications.

## 2024-01-03 NOTE — ED Triage Notes (Signed)
 Patient is coming from home. Patient did cocaine that was mixed with an unknown opioid approx 3 hours, patient has a history of this. Patient called EMS due to lethargy and near syncope. EMS VS 70 HR 98% RA 180/120 BP Hx of HTN 201 CBG 17 RR

## 2024-01-03 NOTE — Progress Notes (Signed)
 Initial Nutrition Assessment  DOCUMENTATION CODES:   Non-severe (moderate) malnutrition in context of social or environmental circumstances (polysubstance abuse)  INTERVENTION:  Initiate tube feeding via OG: initiate at 20ml/h and increase 10ml q10h until goal rate is reached Osmolite 1.5 at 40 ml/h (960 ml per day) Prosource TF20 60 ml 1x daily Provides 1520 kcal, 80 gm protein, 731 ml free water  daily  Monitor magnesium  and phosphorus daily x 4 occurrences, MD to replete as needed, as pt is at risk for refeeding syndrome given malnutrition and hx of substance abuse. 100 mg thiamine  daily  NUTRITION DIAGNOSIS:   Moderate Malnutrition related to social / environmental circumstances (polysubstance abuse) as evidenced by mild muscle depletion, moderate fat depletion, mild fat depletion, moderate muscle depletion.  GOAL:   Patient will meet greater than or equal to 90% of their needs  MONITOR:   TF tolerance, Vent status, Labs  REASON FOR ASSESSMENT:   Consult Enteral/tube feeding initiation and management  ASSESSMENT:   Pt with hx HTN, CHF, type 2 diabetes, polysubstance abuse, and pulmonary edema. Admitted with AMS, lethargy, and deteriorating condition requiring intubation; diagnosed ICH.  9/22 admitted, intubated, R frontal ventriculostomy placed  RD consulted to initiate enteral nutrition. No family at bedside during assessment. Unable to obtain diet/wt hx. Pt discussed during ICU rounds with RN and MD. Nutrition focused physical exam shows mild to moderate fat depletion and mild to moderate muscle depletion, indicative of malnutrition. Suspect malnutrition related to polysubstance abuse.   Patient is currently intubated on ventilator support MV: 13.1 L/min Temp (24hrs), Avg:98.7 F (37.1 C), Min:98.1 F (36.7 C), Max:100.6 F (38.1 C) MAP (cuff): 97-122 mmHg  Propofol : 13.46 ml/hr (provides 355 kcal per day at current rate)  Admit weight: 56.1 kg     Intake/Output Summary (Last 24 hours) at 01/03/2024 1542 Last data filed at 01/03/2024 1500 Gross per 24 hour  Intake 429.4 ml  Output 101 ml  Net 328.4 ml   Net IO Since Admission: 328.4 mL [01/03/24 1542]  Drains/Lines: R frontal ventriculostomy OG tube (gastric per xray) Urethral Catheter  Nutritionally Relevant Medications: Scheduled Meds:  famotidine   20 mg Per Tube QHS   insulin  aspart  0-9 Units Subcutaneous Q4H   pantoprazole  (PROTONIX ) IV  40 mg Intravenous QHS   senna-docusate  1 tablet Oral BID   Continuous Infusions:  clevidipine  8 mg/hr (01/03/24 0700)   propofol  (DIPRIVAN ) infusion 40 mcg/kg/min (01/03/24 0700)    Labs Reviewed: Sodium 139 CBG ranges from 124-129 mg/dL over the last 24 hours HgbA1c 6.6 UDS + cocaine   NUTRITION - FOCUSED PHYSICAL EXAM:  Flowsheet Row Most Recent Value  Orbital Region Mild depletion  Upper Arm Region Unable to assess  Thoracic and Lumbar Region Moderate depletion  Buccal Region Unable to assess  Temple Region Moderate depletion  Clavicle Bone Region Moderate depletion  Clavicle and Acromion Bone Region Moderate depletion  Scapular Bone Region Unable to assess  Dorsal Hand Mild depletion  Patellar Region Mild depletion  Anterior Thigh Region Mild depletion  Posterior Calf Region Mild depletion  Edema (RD Assessment) None  Hair Reviewed  Eyes Unable to assess  Mouth Unable to assess  Skin Reviewed  Nails Reviewed    Diet Order:   Diet Order             Diet NPO time specified  Diet effective now                   EDUCATION NEEDS:  Not appropriate for education at this time  Skin:  Skin Assessment: Reviewed RN Assessment  Last BM:  unknown  Height:   Ht Readings from Last 1 Encounters:  01/03/24 5' 5 (1.651 m)    Weight:   Wt Readings from Last 1 Encounters:  01/03/24 56.1 kg    Ideal Body Weight:  56.8 kg  BMI:  Body mass index is 20.58 kg/m.  Estimated Nutritional Needs:    Kcal:  1500-1700  Protein:  65-90g  Fluid:  1.5-1.7L    Josette Glance, MS, RDN, LDN Clinical Dietitian I Please reach out via secure chat

## 2024-01-03 NOTE — Progress Notes (Signed)
 01/03/2024 EVD being placed. Called friend and left VM. Will adjust vent etc once procedure completed.  Rolan Sharps MD PCCM

## 2024-01-03 NOTE — ED Provider Notes (Signed)
 [facility] AT [facility] Provider Note   CSN: [id number] Arrival date & time: 01/03/24  9886     Patient presents with: Altered Mental Status   Amanda [name] is a 63 y.o. female.   Level 5 caveat for altered mental status.  Patient brought in by EMS with confusion and lethargy.  She apparently called EMS herself.  Told EMS she used cocaine about 3 hours ago but denies this now.  States she did not do any drugs and cannot explain why she is in the hospital currently.  She is oriented to person only.  Denies using any drugs tonight and cannot explain who called EMS or why she came to the hospital.  She denies headache, chest pain, shortness of breath, abdominal pain, nausea and vomiting.  Hypertensive 220 systolic for EMS unclear if she took her medications today.  Does have a history of hypertension and heart failure.  Also history of diabetes and CKD.  Unable to give any meaningful history.  The history is provided by the patient and the EMS personnel. The history is limited by the condition of the patient.  Altered Mental Status      Prior to Admission medications   Medication Sig Start Date End Date Taking? Authorizing Provider  acetaminophen  (TYLENOL ) 500 MG tablet Take 1 tablet (500 mg total) by mouth every 6 (six) hours as needed. 08/30/23   Johnie Flaming A, NP  amoxicillin -clavulanate (AUGMENTIN ) 875-125 MG tablet Take 1 tablet by mouth every 12 (twelve) hours. Patient not taking: Reported on 11/15/2023 08/30/23   Johnie Flaming A, NP  carvedilol  (COREG ) 3.125 MG tablet Take 1 tablet (3.125 mg total) by mouth 2 (two) times daily. 06/18/23 11/14/24  Rising, Asberry, PA-C  chlorhexidine  (PERIDEX ) 0.12 % solution Use as directed 15 mLs in the mouth or throat 2 (two) times daily. Patient not taking: Reported on 11/15/2023 08/30/23   Johnie Flaming A, NP  empagliflozin  (JARDIANCE ) 10 MG TABS tablet Take 10 mg by mouth. 07/16/23   [provider]   furosemide  (LASIX ) 20 MG tablet Take 1 tablet (20 mg total) by mouth as needed for fluid or edema. Weight gain of 3 lb in 24 hours or 5 lb in a week. 11/15/23 02/13/24  Zenaida Morene PARAS, MD  magic mouthwash (nystatin , lidocaine , diphenhydrAMINE, alum & mag hydroxide) suspension Swish and spit 5 mLs 4 (four) times daily as needed for mouth pain. Patient not taking: Reported on 11/15/2023 08/30/23   Johnie Flaming LABOR, NP  Multiple Vitamins-Minerals (MULTIVITAMIN GUMMIES ADULT) CHEW Chew 2 each by mouth daily.    [provider]  sacubitril -valsartan  (ENTRESTO ) 49-51 MG Take 1 tablet by mouth 2 (two) times daily. 11/15/23   Zenaida Morene PARAS, MD  spironolactone  (ALDACTONE ) 25 MG tablet Take 1 tablet (25 mg total) by mouth daily. 06/18/23 11/14/24  Rising, Asberry, PA-C  VENTOLIN  HFA 108 (90 Base) MCG/ACT inhaler Inhale 2 puffs into the lungs every 6 (six) hours as needed.    [provider]    Allergies: Patient has no known allergies.    Review of Systems  Unable to perform ROS: Mental status change    Updated Vital Signs BP 121/83 Comment: clevi turned off  Pulse 71   Temp 98.4 F (36.9 C)   Resp (!) 30   Ht 5' 5 (1.651 m)   Wt 56.1 kg   LMP 12/24/2015 (Within Days)   SpO2 99%   BMI 20.58 kg/m   Physical Exam Vitals  and nursing note reviewed.  Constitutional:      General: She is not in acute distress.    Appearance: She is well-developed. She is not ill-appearing.     Comments: Awake, resting with eyes closed, minimal verbal response but able to state her name and know that she is in the hospital.  HENT:     Head: Normocephalic and atraumatic.     Mouth/Throat:     Pharynx: No oropharyngeal exudate.  Eyes:     Conjunctiva/sclera: Conjunctivae normal.     Pupils: Pupils are equal, round, and reactive to light.  Neck:     Comments: No meningismus. Cardiovascular:     Rate and Rhythm: Normal rate and regular rhythm.     Heart sounds: Normal heart sounds. No  murmur heard. Pulmonary:     Effort: Pulmonary effort is normal. No respiratory distress.     Breath sounds: Normal breath sounds.  Abdominal:     Palpations: Abdomen is soft.     Tenderness: There is no abdominal tenderness. There is no guarding or rebound.  Musculoskeletal:        General: No tenderness. Normal range of motion.     Cervical back: Normal range of motion and neck supple.  Skin:    General: Skin is warm.  Neurological:     Mental Status: She is alert.     Motor: No abnormal muscle tone.     Comments: Poorly cooperative with exam.  No obvious facial droop.  Able to lift arms and legs off the bed bilaterally. Moves all extremities equally but will not cooperate with formal testing.  Psychiatric:        Behavior: Behavior normal.     (all labs ordered are listed, but only abnormal results are displayed) Labs Reviewed  CBC WITH DIFFERENTIAL/PLATELET - Abnormal; Notable for the following components:      Result Value   RBC 5.14 (*)    All other components within normal limits  ACETAMINOPHEN  LEVEL - Abnormal; Notable for the following components:   Acetaminophen  (Tylenol ), Serum <10 (*)    All other components within normal limits  SALICYLATE LEVEL - Abnormal; Notable for the following components:   Salicylate Lvl <7.0 (*)    All other components within normal limits  TSH - Abnormal; Notable for the following components:   TSH 0.262 (*)    All other components within normal limits  BRAIN NATRIURETIC PEPTIDE - Abnormal; Notable for the following components:   B Natriuretic Peptide 370.8 (*)    All other components within normal limits  COMPREHENSIVE METABOLIC PANEL WITH GFR - Abnormal; Notable for the following components:   Creatinine, Ser 1.04 (*)    Albumin 2.9 (*)    All other components within normal limits  I-STAT CHEM 8, ED - Abnormal; Notable for the following components:   Potassium 6.0 (*)    Glucose, Bld 107 (*)    Calcium, Ion 1.01 (*)     Hemoglobin 16.0 (*)    HCT 47.0 (*)    All other components within normal limits  TROPONIN I (HIGH SENSITIVITY) - Abnormal; Notable for the following components:   Troponin I (High Sensitivity) 34 (*)    All other components within normal limits  MRSA NEXT GEN BY PCR, NASAL  ETHANOL  AMMONIA  URINALYSIS, ROUTINE W REFLEX MICROSCOPIC  RAPID URINE DRUG SCREEN, HOSP PERFORMED  LIPASE, BLOOD  BLOOD GAS, ARTERIAL  MAGNESIUM   PHOSPHORUS  HEMOGLOBIN A1C  I-STAT ARTERIAL BLOOD GAS, ED  TROPONIN I (HIGH SENSITIVITY)    EKG: EKG Interpretation Date/Time:  Monday January 03 2024 01:45:34 EDT Ventricular Rate:  85 PR Interval:  149 QRS Duration:  89 QT Interval:  415 QTC Calculation: 494 R Axis:   68  Text Interpretation: Sinus rhythm Biatrial enlargement Left ventricular hypertrophy Nonspecific T abnrm, anterolateral leads Borderline prolonged QT interval No significant change was found Confirmed by Carita Senior [phone]) on 01/03/2024 1:54:01 AM  Radiology: CT Head Wo Contrast Result Date: 01/03/2024 EXAM: CTA Head and Neck with Intravenous Contrast. CT Head without Contrast. CLINICAL HISTORY: Mental status change, unknown cause. Near syncope. EMS called for lethargy and AMS. Pt reports cocaine use approx 3 hours ago. TECHNIQUE: Axial CTA images of the head and neck performed with intravenous contrast. MIP reconstructed images were created and reviewed. Axial computed tomography images of the head/brain performed without intravenous contrast. Note: Per PQRS, the description of internal carotid artery percent stenosis, including 0 percent or normal exam, is based on [place] Symptomatic Carotid Endarterectomy Trial (NASCET) criteria. Dose reduction technique was used including one or more of the following: automated exposure control, adjustment of mA and kV according to patient size, and/or iterative reconstruction. CONTRAST: 100 mL iohexol  (OMNIPAQUE ) 350 MG/ML injection. COMPARISON:  None provided. 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 The hematoma measures 3.0 x 1.9 x 2.5 cm (approximately 7.5 mL). No herniation. VENTRICLES: Primarily hydrocephalus. ORBITS: The orbits are unremarkable. SINUSES AND MASTOIDS: The paranasal sinuses and mastoid air cells are clear. CTA NECK: COMMON CAROTID ARTERIES: No significant stenosis. No dissection or occlusion. INTERNAL CAROTID ARTERIES: No hemodynamically significant stenosis by NASCET criteria. Mild atherosclerotic calcification of both carotid bifurcations. No dissection or occlusion. VERTEBRAL ARTERIES: Poor visualization of the V1 segment of the left vertebral artery. Poor opacification of the V4 segment of the left vertebral artery. No significant stenosis. No dissection or occlusion. CTA HEAD: ANTERIOR CEREBRAL ARTERIES: No significant stenosis. No occlusion. No aneurysm. MIDDLE CEREBRAL ARTERIES: No significant stenosis. No occlusion. No aneurysm. POSTERIOR CEREBRAL ARTERIES: No significant stenosis. No occlusion. No aneurysm. BASILAR ARTERY: No significant stenosis. No occlusion. No aneurysm. OTHER: Focal opacity in the right lung apex better characterized on concomitant chest CT. SOFT TISSUES: No acute finding. No masses or lymphadenopathy. BONES: No acute osseous abnormality. IMPRESSION: 1. 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 No herniation. 2. No emergent large vessel occlusion. 3. Poor opacification of the v1 and v4 segments of the left vertebral artery. 4. Mild atherosclerotic calcification of both carotid bifurcations without hemodynamically significant stenosis. 5. Chronic ischemic white matter changes. Findings discussed with Dr. Senior Carita at 2:58 AM on 01/03/2024. Electronically signed by: Franky Stanford MD 01/03/2024  02:59 AM EDT RP Workstation: [id number]   CT ANGIO HEAD NECK W WO CM Result Date: 01/03/2024 EXAM: CTA Head and Neck with Intravenous Contrast. CT Head without Contrast. CLINICAL HISTORY: Mental status change, unknown cause. Near syncope. EMS called for lethargy and AMS. Pt reports cocaine use approx 3 hours ago. TECHNIQUE: Axial CTA images of the head and neck performed with intravenous contrast. MIP reconstructed images were created and reviewed. Axial computed tomography images of the head/brain performed without intravenous contrast. Note: Per PQRS, the description of internal carotid artery percent stenosis, including 0 percent or normal exam, is based on [place] Symptomatic Carotid Endarterectomy Trial (NASCET) criteria. Dose reduction technique was used including one or  more of the following: automated exposure control, adjustment of mA and kV according to patient size, and/or iterative reconstruction. CONTRAST: 100 mL iohexol  (OMNIPAQUE ) 350 MG/ML injection. COMPARISON: None provided. 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 The hematoma measures 3.0 x 1.9 x 2.5 cm (approximately 7.5 mL). No herniation. VENTRICLES: Primarily hydrocephalus. ORBITS: The orbits are unremarkable. SINUSES AND MASTOIDS: The paranasal sinuses and mastoid air cells are clear. CTA NECK: COMMON CAROTID ARTERIES: No significant stenosis. No dissection or occlusion. INTERNAL CAROTID ARTERIES: No hemodynamically significant stenosis by NASCET criteria. Mild atherosclerotic calcification of both carotid bifurcations. No dissection or occlusion. VERTEBRAL ARTERIES: Poor visualization of the V1 segment of the left vertebral artery. Poor opacification of the V4 segment of the left vertebral artery. No significant stenosis. No dissection or occlusion. CTA HEAD: ANTERIOR CEREBRAL ARTERIES: No significant stenosis. No occlusion. No aneurysm. MIDDLE CEREBRAL  ARTERIES: No significant stenosis. No occlusion. No aneurysm. POSTERIOR CEREBRAL ARTERIES: No significant stenosis. No occlusion. No aneurysm. BASILAR ARTERY: No significant stenosis. No occlusion. No aneurysm. OTHER: Focal opacity in the right lung apex better characterized on concomitant chest CT. SOFT TISSUES: No acute finding. No masses or lymphadenopathy. BONES: No acute osseous abnormality. IMPRESSION: 1. 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 No herniation. 2. No emergent large vessel occlusion. 3. Poor opacification of the v1 and v4 segments of the left vertebral artery. 4. Mild atherosclerotic calcification of both carotid bifurcations without hemodynamically significant stenosis. 5. Chronic ischemic white matter changes. Findings discussed with Dr. Garnette Halo at 2:58 AM on 01/03/2024. Electronically signed by: Franky Stanford MD 01/03/2024 02:59 AM EDT RP Workstation: [id number]   CT Angio Chest/Abd/Pel for Dissection W and/or Wo Contrast Result Date: 01/03/2024 EXAM: CTA CHEST, ABDOMEN AND PELVIS WITH AND WITHOUT CONTRAST 01/03/2024 02:45:00 AM TECHNIQUE: CTA of the chest was performed with and without the administration of intravenous contrast. CTA of the abdomen and pelvis was performed with and without the administration of intravenous contrast. Multiplanar reformatted images are provided for review. MIP images are provided for review. Automated exposure control, iterative reconstruction, and/or weight based adjustment of the mA/kV was utilized to reduce the radiation dose to as low as reasonably achievable. COMPARISON: CTA chest dated 09/09/2021. CLINICAL HISTORY: Acute aortic syndrome (AAS) suspected. Near syncope. EMS called for lethargy and AMS. Pt reports cocaine use approx 3 hours ago. FINDINGS: VASCULATURE: AORTA: No thoracoabdominal aortic aneurysm or  dissection. PULMONARY ARTERIES: No pulmonary embolism within the limits of this exam. GREAT VESSELS OF AORTIC ARCH: No acute finding. No dissection. No arterial occlusion or significant stenosis. CELIAC TRUNK: No acute finding. No occlusion or significant stenosis. SUPERIOR MESENTERIC ARTERY: No acute finding. No occlusion or significant stenosis. INFERIOR MESENTERIC ARTERY: No acute finding. No occlusion or significant stenosis. RENAL ARTERIES: No acute finding. No occlusion or significant stenosis. ILIAC ARTERIES: No acute finding. No occlusion or significant stenosis. CHEST: MEDIASTINUM: Mild cardiomegaly. No mediastinal lymphadenopathy. The heart and pericardium demonstrate no acute abnormality. LUNGS AND PLEURA: Mild patchy opacities in the posterior upper and lower lobes, favoring atelectasis. Mild centrilobular emphysematous changes, upper lung predominant. 2.4 cm right apical opacity (image 17), favoring pleural parenchymal scarring on coronal imaging, but new from remote prior. Correlate for radiation changes to this region. Otherwise, consider follow up CT chest in 3 months. No evidence of pleural effusion or pneumothorax. THORACIC BONES AND SOFT TISSUES: Mild degenerative changes of  the lower thoracic spine. No acute soft tissue abnormality. ABDOMEN AND PELVIS: LIVER: The liver is unremarkable. GALLBLADDER AND BILE DUCTS: Gallbladder is unremarkable. No biliary ductal dilatation. SPLEEN: The spleen is unremarkable. PANCREAS: The pancreas is unremarkable. ADRENAL GLANDS: Bilateral adrenal glands demonstrate no acute abnormality. KIDNEYS, URETERS AND BLADDER: 3.0 cm simple left upper pole renal cyst, benign (Bosniak 1). No stones in the kidneys or ureters. No hydronephrosis. No perinephric or periureteral stranding. Urinary bladder is unremarkable. GI AND BOWEL: Normal appendix. Stomach and duodenal sweep demonstrate no acute abnormality. There is no bowel obstruction. No abnormal bowel wall thickening or  distension. REPRODUCTIVE: Uterus is within normal limits. PERITONEUM AND RETROPERITONEUM: No ascites or free air. LYMPH NODES: No lymphadenopathy. ABDOMINAL BONES AND SOFT TISSUES: No acute abnormality of the bones. No acute soft tissue abnormality. IMPRESSION: 1. No thoracoabdominal aortic aneurysm or dissection. 2. Suspected right apical pleural parenchymal scarring, new from prior. Correlate for radiation changes to this region. Otherwise, consider follow-up CT chest in 3 months. Electronically signed by: Pinkie Pebbles MD 01/03/2024 02:56 AM EDT RP Workstation: [id number]   DG Chest Portable 1 View Result Date: 01/03/2024 EXAM: 1 VIEW XRAY OF THE CHEST 01/03/2024 02:19:00 AM COMPARISON: 05/12/2023 CLINICAL HISTORY: cp. ams FINDINGS: LUNGS AND PLEURA: No focal pulmonary opacity. No frank interstitial edema. No pleural effusion. No pneumothorax. HEART AND MEDIASTINUM: Cardiomegaly. BONES AND SOFT TISSUES: No acute osseous abnormality. IMPRESSION: 1. No acute abnormalities. 2. Cardiomegaly. No frank interstitial edema. Electronically signed by: Pinkie Pebbles MD 01/03/2024 02:22 AM EDT RP Workstation: [id number]     .Critical Care  Performed by: Carita Senior, MD Authorized by: Carita Senior, MD   Critical care provider statement:    Critical care time (minutes):  60   Critical care time was exclusive of:  Separately billable procedures and treating other patients   Critical care was necessary to treat or prevent imminent or life-threatening deterioration of the following conditions:  CNS failure or compromise and respiratory failure   Critical care was time spent personally by me on the following activities:  Development of treatment plan with patient or surrogate, discussions with consultants, evaluation of patient's response to treatment, examination of patient, ordering and review of laboratory studies, ordering and review of radiographic studies, ordering and performing treatments and  interventions, pulse oximetry, re-evaluation of patient's condition, review of old charts, blood draw for specimens and obtaining history from patient or surrogate   I assumed direction of critical care for this patient from another provider in my specialty: no     Care discussed with: admitting provider   Procedure Name: Intubation Date/Time: 01/03/2024 3:54 AM  Performed by: Carita Senior, MDPre-anesthesia Checklist: Patient identified, Patient being monitored, Emergency Drugs available, Timeout performed and Suction available Oxygen Delivery Method: Non-rebreather mask Preoxygenation: Pre-oxygenation with 100% oxygen Induction Type: Rapid sequence Ventilation: Mask ventilation without difficulty Laryngoscope Size: Glidescope, 4 and Mac Grade View: Grade I Tube size: 7.5 mm Number of attempts: 1 Airway Equipment and Method: Video-laryngoscopy and Bougie stylet Placement Confirmation: ETT inserted through vocal cords under direct vision, CO2 detector and Breath sounds checked- equal and bilateral Secured at: 25 cm Tube secured with: ETT holder Dental Injury: Teeth and Oropharynx as per pre-operative assessment  Difficulty Due To: Difficulty was anticipated Future Recommendations: Recommend- induction with short-acting agent, and alternative techniques readily available       Medications Ordered in the ED - No data to display  Medical Decision Making Amount and/or Complexity of Data Reviewed Independent Historian: EMS Labs: ordered. Decision-making details documented in ED Course. Radiology: ordered and independent interpretation performed. Decision-making details documented in ED Course. ECG/medicine tests: ordered and independent interpretation performed. Decision-making details documented in ED Course.  Risk Prescription drug management. Decision regarding hospitalization.   Mental status change with confusion and possible drug use.   Hypertensive on arrival but otherwise stable vitals.  Unclear last seen normal.  Will obtain CT head, labs and EKG.  Patient hypertensive but denies any cocaine use but did tell EMS she apparently did use cocaine.  CT head is positive for thalamic hemorrhage with some midline shift. Initiate Cleviprex .  She does not take any anticoagulation. Discussed with Dr. Alexa of radiology.  Approximately 7 mL intraparenchymal thalamic and basal ganglia hemorrhage with early hydrocephalus. No emergent large vessel occlusion. No thoracic aortic aneurysm or dissection.   Continues to protect her airway.  Will follow some commands. Discussed with Dr. Merrianne  Initialpotassium 6.0 on i-STAT.  Question accuracy of this.  Will check the lab  Potassium is normal on laboratory values.  Creatinine at baseline.  Discussed with Dr. Merrianne of neurology.  He agrees with Cleviprex  with goal of systolic blood pressure less than 130.  Patient placed in restraints for combativeness as she is not cooperative with care but she is maintaining her airway and answering questions.  Blood pressure has improved to 130 systolic. Dr. Merrianne requests consultation with neurosurgery given likely early hydrocephalus.  Discussed with PA-C Johnanna.  At 3 AM.  PA-C Johnanna has discussed with Dr. Debby who will defer EVD at this time but does recommend repeat head CT at 7 AM.  Admission discussed with Dr. Merrianne of neurology.  CTA of chest abdomen pelvis negative for aortic aneurysm or dissection.  Patient's mental status has started to worsen.  She is becoming more obtunded and not as awake as when she first arrived.  Will proceed with intubation protect her airway at this time.  Additional labs show elevated troponin 34, glucose normal, hemoglobin normal.  UDS was positive for cocaine in January.  Per neurosurgery plan is to repeat CT head at 7 AM.  Will continue with Cleviprex  and initiate propofol  sedation. Dr. Lindzen  updated.  Admission discussed with critical care team Dr. Dub.     Final diagnoses:  Hemorrhagic stroke [facility])  Hypertensive emergency    ED Discharge Orders     None          Wilver Tignor, Garnette, MD 01/03/24 [id number]

## 2024-01-03 NOTE — ED Notes (Signed)
 Report given to Marjorie, RN of [facility] ICU.

## 2024-01-03 NOTE — Progress Notes (Signed)
 SLP Cancellation Note  Patient Details Name: LAVREN LEWAN MRN: [id number] DOB: 03/11/1961   Cancelled treatment:       Reason Eval/Treat Not Completed: Patient not medically ready   Lulamae Skorupski, Consuelo Fitch 01/03/2024, 8:33 AM

## 2024-01-04 ENCOUNTER — Inpatient Hospital Stay (HOSPITAL_COMMUNITY)

## 2024-01-04 DIAGNOSIS — I610 Nontraumatic intracerebral hemorrhage in hemisphere, subcortical: Secondary | ICD-10-CM

## 2024-01-04 DIAGNOSIS — F17210 Nicotine dependence, cigarettes, uncomplicated: Secondary | ICD-10-CM

## 2024-01-04 DIAGNOSIS — G919 Hydrocephalus, unspecified: Secondary | ICD-10-CM | POA: Diagnosis not present

## 2024-01-04 DIAGNOSIS — R918 Other nonspecific abnormal finding of lung field: Secondary | ICD-10-CM

## 2024-01-04 DIAGNOSIS — N189 Chronic kidney disease, unspecified: Secondary | ICD-10-CM

## 2024-01-04 DIAGNOSIS — I509 Heart failure, unspecified: Secondary | ICD-10-CM

## 2024-01-04 DIAGNOSIS — F1410 Cocaine abuse, uncomplicated: Secondary | ICD-10-CM

## 2024-01-04 DIAGNOSIS — R29723 NIHSS score 23: Secondary | ICD-10-CM | POA: Diagnosis not present

## 2024-01-04 DIAGNOSIS — I615 Nontraumatic intracerebral hemorrhage, intraventricular: Secondary | ICD-10-CM

## 2024-01-04 DIAGNOSIS — I130 Hypertensive heart and chronic kidney disease with heart failure and stage 1 through stage 4 chronic kidney disease, or unspecified chronic kidney disease: Secondary | ICD-10-CM

## 2024-01-04 DIAGNOSIS — I69391 Dysphagia following cerebral infarction: Secondary | ICD-10-CM

## 2024-01-04 DIAGNOSIS — I619 Nontraumatic intracerebral hemorrhage, unspecified: Secondary | ICD-10-CM | POA: Diagnosis not present

## 2024-01-04 DIAGNOSIS — Z982 Presence of cerebrospinal fluid drainage device: Secondary | ICD-10-CM

## 2024-01-04 LAB — CBC
HCT: 44.9 % (ref 36.0–46.0)
Hemoglobin: 14.6 g/dL (ref 12.0–15.0)
MCH: 28.7 pg (ref 26.0–34.0)
MCHC: 32.5 g/dL (ref 30.0–36.0)
MCV: 88.4 fL (ref 80.0–100.0)
Platelets: 295 K/uL (ref 150–400)
RBC: 5.08 MIL/uL (ref 3.87–5.11)
RDW: 13.6 % (ref 11.5–15.5)
WBC: 12 K/uL — ABNORMAL HIGH (ref 4.0–10.5)
nRBC: 0 % (ref 0.0–0.2)

## 2024-01-04 LAB — GLUCOSE, CAPILLARY
Glucose-Capillary: 102 mg/dL — ABNORMAL HIGH (ref 70–99)
Glucose-Capillary: 116 mg/dL — ABNORMAL HIGH (ref 70–99)
Glucose-Capillary: 123 mg/dL — ABNORMAL HIGH (ref 70–99)
Glucose-Capillary: 132 mg/dL — ABNORMAL HIGH (ref 70–99)
Glucose-Capillary: 141 mg/dL — ABNORMAL HIGH (ref 70–99)
Glucose-Capillary: 183 mg/dL — ABNORMAL HIGH (ref 70–99)
Glucose-Capillary: 98 mg/dL (ref 70–99)

## 2024-01-04 LAB — BASIC METABOLIC PANEL WITH GFR
Anion gap: 12 (ref 5–15)
BUN: 16 mg/dL (ref 8–23)
CO2: 26 mmol/L (ref 22–32)
Calcium: 8.9 mg/dL (ref 8.9–10.3)
Chloride: 100 mmol/L (ref 98–111)
Creatinine, Ser: 1.07 mg/dL — ABNORMAL HIGH (ref 0.44–1.00)
GFR, Estimated: 59 mL/min — ABNORMAL LOW (ref >60)
Glucose, Bld: 151 mg/dL — ABNORMAL HIGH (ref 70–99)
Potassium: 3.6 mmol/L (ref 3.5–5.1)
Sodium: 138 mmol/L (ref 135–145)

## 2024-01-04 LAB — T3, FREE: T3, Free: 2.5 pg/mL (ref 2.0–4.4)

## 2024-01-04 LAB — MAGNESIUM: Magnesium: 2.4 mg/dL (ref 1.7–2.4)

## 2024-01-04 LAB — PHOSPHORUS: Phosphorus: 3.2 mg/dL (ref 2.5–4.6)

## 2024-01-04 LAB — TRIGLYCERIDES: Triglycerides: 167 mg/dL — ABNORMAL HIGH (ref <150)

## 2024-01-04 MED ORDER — POTASSIUM CHLORIDE 20 MEQ PO PACK
40.0000 meq | PACK | Freq: Once | ORAL | Status: AC
  Administered 2024-01-04: 40 meq
  Filled 2024-01-04: qty 2

## 2024-01-04 MED ORDER — CARVEDILOL 12.5 MG PO TABS
12.5000 mg | ORAL_TABLET | Freq: Two times a day (BID) | ORAL | Status: DC
  Administered 2024-01-04 – 2024-01-06 (×3): 12.5 mg
  Filled 2024-01-04 (×4): qty 1

## 2024-01-04 MED ORDER — ORAL CARE MOUTH RINSE: 15.0000 mL | OROMUCOSAL | Status: DC | PRN

## 2024-01-04 MED ORDER — LABETALOL HCL 5 MG/ML IV SOLN
0.5000 mg/min | Status: DC
  Filled 2024-01-04: qty 80

## 2024-01-04 MED ORDER — DEXMEDETOMIDINE HCL IN NACL 400 MCG/100ML IV SOLN
0.0000 ug/kg/h | INTRAVENOUS | Status: DC
  Administered 2024-01-04 – 2024-01-05 (×2): 0.4 ug/kg/h via INTRAVENOUS
  Filled 2024-01-04 (×2): qty 100

## 2024-01-04 MED ORDER — SACUBITRIL-VALSARTAN 97-103 MG PO TABS
1.0000 | ORAL_TABLET | Freq: Two times a day (BID) | ORAL | Status: DC
  Administered 2024-01-05 – 2024-01-09 (×9): 1
  Filled 2024-01-04 (×13): qty 1

## 2024-01-04 NOTE — Progress Notes (Signed)
   NAME:  Amanda [name], MRN:  [id number], DOB:  [birth year], LOS: 1 ADMISSION DATE:  01/03/2024, CONSULTATION DATE:  01/03/2024 REFERRING MD: Carita Senior, MD, CHIEF COMPLAINT:  AMS for one hour   History of Present Illness:  [name] with HTN, CHF (systolic and diastolic, EF 30%, G3DD, RVSP 50.3 mmHg), DM-2, and polysubstance/cocaine abuse 3 hrs before presentation, who called EMS for AMS, lethargy and near syncope. Her BP in ED 180/120, repeat BP 220/126. She was oriented to person only. She denied headache, chest pain, shortness of breath, abdominal pain, nausea and vomiting. Unable to give any meaningful history. Her condition deteriorated (unable to protect her airway and more lethargic) and got intubated. Clevidipine  was started for BP control and Propofol  for sedation. On NS 0.9% infusion.   Pertinent  Medical History  HTN, CHF systolic and diastolic (Jan 2025 Echo: EF 30%, G3DD, RVSP 50.3 mmHg), DM-2, polysubstance/cocaine abuse  Significant Hospital Events: Including procedures, antibiotic start and stop dates in addition to other pertinent events   01/03/2024: ED eval 9/22: PCCM called for admission   Interim History / Subjective:  Sedated on vent.  Objective    Blood pressure 130/84, pulse 81, temperature 99 F (37.2 C), resp. rate 18, height 5' 5 (1.651 m), weight 53.3 kg, last menstrual period 12/24/2015, SpO2 98%.    Vent Mode: PRVC FiO2 (%):  [40 %] 40 % Set Rate:  [30 bmp] 30 bmp Vt Set:  [440 mL] 440 mL PEEP:  [5 cmH20] 5 cmH20 Pressure Support:  [5 cmH20] 5 cmH20 Plateau Pressure:  [10 cmH20-17 cmH20] 17 cmH20   Intake/Output Summary (Last 24 hours) at 01/04/2024 0954 Last data filed at 01/04/2024 0800 Gross per 24 hour  Intake 1320.19 ml  Output 1580 ml  Net -259.81 ml   Filed Weights   01/03/24 0504 01/04/24 0500  Weight: 56.1 kg 53.3 kg    Examination: No distress Withdraws x 4 Not following commands Cough good EVD mostly clear  CSF Abd soft  Potassium repleted CBC looks okay  Resolved problem list   Assessment and Plan  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

## 2024-01-04 NOTE — Progress Notes (Signed)
    Providing Compassionate, Quality Care - Together   NEUROSURGERY PROGRESS NOTE     S: No issues overnight.    O: EXAM:  BP 123/81   Pulse 71   Temp 98.8 F (37.1 C)   Resp (!) 22   Ht 5' 5 (1.651 m)   Wt 53.3 kg   LMP 12/24/2015 (Within Days)   SpO2 96%   BMI 19.55 kg/m     Drowsy, eyes open spontaneously Follows commands RUE Localizes to pain BLE No w/d to pain LUE EVD tidaling, blood tinged   ASSESSMENT:  63 y.o. with IPH with IVH, s/p EVD    PLAN: -Continue EVD at 10 cm H2O -Call w/ questions/concerns.   Camie Pickle, [facility]

## 2024-01-04 NOTE — TOC CM/SW Note (Signed)
 Transition of Care [facility]) - Inpatient Brief Assessment   Patient Details  Name: Amanda [name] MRN: [id number] Date of Birth: 04/09/1961  Transition of [facility]) CM/SW Contact:    Chioke Noxon M, RN Phone Number: 01/04/2024, 3:56 PM   Clinical Narrative: Patient brought in by EMS due to confusion and lethargy, after calling 911 herself.   She admitted to using cocaine approximately 3 hours before. 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 Patient extubated with EVD; awaiting PT/OT evaluations to determine LOC at discharge. Inpatient Care Management will continue to follow.    Transition of Care Asessment: Insurance and Status: Insurance coverage has been reviewed Patient has primary care physician: No Home environment has been reviewed: ?Alone Prior level of function:: Independent Prior/Current Home Services: No current home services Social Drivers of Health Review: SDOH reviewed needs interventions Readmission risk has been reviewed: Yes Transition of care needs: transition of care needs identified, TOC will continue to follow]  Mliss MICAEL Fass, RN, BSN  Trauma/Neuro ICU Case Manager [phone]

## 2024-01-04 NOTE — Plan of Care (Signed)
  Problem: Safety: Goal: Non-violent Restraint(s) Outcome: Progressing   Problem: Skin Integrity: Goal: Risk for impaired skin integrity will decrease Outcome: Progressing   Problem: Tissue Perfusion: Goal: Adequacy of tissue perfusion will improve Outcome: Progressing

## 2024-01-04 NOTE — Progress Notes (Signed)
 PT Cancellation Note  Patient Details Name: Amanda [name] MRN: [id number] DOB: [birth year]   Cancelled Treatment:    Reason Eval/Treat Not Completed: Active bedrest order   Shalisa Mcquade S, PT DPT Acute Rehabilitation Services Secure Chat Preferred  Office [phone]   [name] Kingdom 01/04/2024, 8:48 AM

## 2024-01-04 NOTE — Progress Notes (Signed)
 Pt was transported to CT and back via ventilator with no apparent complications. Pt is now back in 4N21 and is VSS at this time.

## 2024-01-04 NOTE — Progress Notes (Signed)
 OT Cancellation Note  Patient Details Name: Amanda [name] MRN: [id number] DOB: [birth year]   Cancelled Treatment:    Reason Eval/Treat Not Completed: Active bedrest order, OT will continue efforts once patient is cleared.    Brix Brearley D Monicka Cyran 01/04/2024, 9:25 AM 01/04/2024  RP, OTR/L  Acute Rehabilitation Services  Office:  [phone]

## 2024-01-04 NOTE — Progress Notes (Signed)
 01/04/2024 Patient woke up hard coughing against tube. Given intracranial hemorrhage I elected to personally extubate to prevent further ICP issues. + following commands, + cuff leak, extubated, able to say name, placed on [place]

## 2024-01-04 NOTE — Progress Notes (Addendum)
 STROKE TEAM PROGRESS NOTE    SIGNIFICANT HOSPITAL EVENTS 9/21: Brought in by EMS due to confusion and lethargy.  Patient called EMS themselves.  Admitted to using cocaine approximately 3 hours before. 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  9/22.: Emergent [place] holes and right frontal ventriculostomy placed by neurosurgery due to neurological worsening.  INTERIM HISTORY/SUBJECTIVE Extubated today, neuroexam is stable.  Opens eyes spontaneously and following commands with right upper extremity.  No movement in the left and left upper extremity even with painful stimuli.  Withdraws in bilateral lower extremities.  Continue EVD at 10 cm H2O  OBJECTIVE CBC    Component Value Date/Time   WBC 12.0 (H) 01/04/2024 0504   RBC 5.08 01/04/2024 0504   HGB 14.6 01/04/2024 0504   HCT 44.9 01/04/2024 0504   PLT 295 01/04/2024 0504   MCV 88.4 01/04/2024 0504   MCH 28.7 01/04/2024 0504   MCHC 32.5 01/04/2024 0504   RDW 13.6 01/04/2024 0504   LYMPHSABS 1.9 01/03/2024 0145   MONOABS 0.6 01/03/2024 0145   EOSABS 0.1 01/03/2024 0145   BASOSABS 0.0 01/03/2024 0145   BMET    Component Value Date/Time   NA 138 01/04/2024 0504   K 3.6 01/04/2024 0504   CL 100 01/04/2024 0504   CO2 26 01/04/2024 0504   GLUCOSE 151 (H) 01/04/2024 0504   BUN 16 01/04/2024 0504   CREATININE 1.07 (H) 01/04/2024 0504   CREATININE 0.90 11/01/2013 1024   CALCIUM 8.9 01/04/2024 0504   GFRNONAA 59 (L) 01/04/2024 0504   GFRNONAA 74 11/01/2013 1024   Lab Results  Component Value Date   HGBA1C 6.6 (H) 01/03/2024   Lab Results  Component Value Date   CHOL 149 01/03/2024   HDL 60 01/03/2024   LDLCALC 77 01/03/2024   TRIG 167 (H) 01/04/2024   CHOLHDL 2.5 01/03/2024   Drugs of Abuse     Component Value Date/Time   LABOPIA NONE DETECTED 01/03/2024 0459   COCAINSCRNUR POSITIVE (A) 01/03/2024 0459   LABBENZ NONE DETECTED 01/03/2024 0459   AMPHETMU NONE DETECTED 01/03/2024 0459    THCU NONE DETECTED 01/03/2024 0459   LABBARB NONE DETECTED 01/03/2024 0459    Urinalysis    Component Value Date/Time   COLORURINE YELLOW 01/03/2024 0459   APPEARANCEUR CLEAR 01/03/2024 0459   LABSPEC >1.046 (H) 01/03/2024 0459   PHURINE 6.0 01/03/2024 0459   GLUCOSEU 50 (A) 01/03/2024 0459   HGBUR NEGATIVE 01/03/2024 0459   BILIRUBINUR NEGATIVE 01/03/2024 0459   BILIRUBINUR Small 10/11/2013 1200   KETONESUR NEGATIVE 01/03/2024 0459   PROTEINUR 30 (A) 01/03/2024 0459   UROBILINOGEN 0.2 10/11/2013 1200   UROBILINOGEN 0.2 02/28/2008 1612   NITRITE NEGATIVE 01/03/2024 0459   LEUKOCYTESUR NEGATIVE 01/03/2024 0459   IMAGING past 24 hours CT HEAD WO CONTRAST ( ) Result Date: 01/04/2024 EXAM: CT HEAD WITHOUT CONTRAST 01/04/2024 09:01:31 AM TECHNIQUE: CT of the head was performed without the administration of intravenous contrast. Automated exposure control, iterative reconstruction, and/or weight based adjustment of the mA/kV was utilized to reduce the radiation dose to as low as reasonably achievable. COMPARISON: CT of the head dated 01/03/2024. CLINICAL HISTORY: Headache, fever. FINDINGS: BRAIN AND VENTRICLES: Right frontal approach ventriculostomy catheter has been placed and its abuts the septum pellucidum. There is new hemorrhage along the posterior margin of the tract of the catheter, including within the caudate. The intrathalamic hemorrhage appears to have decreased in size in the interim from approximately 3.2 x 2.5 x 3.2 cm  to approximately 2.8 x 2.0 x 3.2 cm. There is slightly more blood layering dependently within the posterior horns of the lateral ventricles. The area of contrast enhancement within the left basal ganglia has diminished in the interim. ORBITS: No acute abnormality. SINUSES: No acute abnormality. SOFT TISSUES AND SKULL: No acute soft tissue abnormality. No skull fracture. IMPRESSION: 1. Right frontal approach ventriculostomy catheter in place, abutting the septum  pellucidum, with new hemorrhage along the posterior margin of the tract, including within the caudate. 2. Decreased size of intrathalamic hemorrhage. 3. Slightly increased blood layering dependently within the posterior horns of the lateral ventricles. 4. Diminished area of contrast enhancement within the left basal ganglia. Electronically signed by: Evalene Coho MD 01/04/2024 09:21 AM EDT RP Workstation: [id number]   MR BRAIN WO CONTRAST Result Date: 01/03/2024 CLINICAL DATA:  Stroke, hemorrhagic Assess for transependymal CSF flow. EXAM: MRI HEAD WITHOUT CONTRAST TECHNIQUE: Multiplanar, multiecho pulse sequences of the brain and surrounding structures were obtained without intravenous contrast. COMPARISON:  CT head earlier today. FINDINGS: Brain: When comparing across modalities, similar size of an intraparenchymal hemorrhage centered in the right thalamus with additional smaller intraparenchymal hemorrhage in the left basal ganglia. Right frontal approach ventriculostomy catheter with some hemorrhage and edema along the catheter tract. Now mild hydrocephalus appears slightly improved in comparison to recent CT head. No convincing transependymal flow of CSF. Moderate periventricular T2/FLAIR hyperintensities are nonspecific but most likely secondary to chronic microvascular ischemic disease. Mildly increased volume of intraventricular hemorrhage layering in the occipital horns. No evidence of acute infarct, mass lesion or substantial midline shift. Vascular: Major arterial flow voids are maintained skull base. Skull and upper cervical spine: Normal marrow signal. Sinuses/Orbits: No mastoid effusions.  No acute orbital findings. IMPRESSION: 1. When comparing across modalities, similar size of an intraparenchymal hemorrhage centered in the right thalamus and smaller intraparenchymal hemorrhage in the left basal ganglia. 2. Mildly increased volume of intraventricular hemorrhage layering in the occipital horns.  3. Right frontal approach ventriculostomy catheter with some hemorrhage and edema along the catheter tract. Now mild hydrocephalus appears slightly improved in comparison to recent CT head. No convincing transependymal flow of CSF. Electronically Signed   By: Gilmore GORMAN Molt M.D.   On: 01/03/2024 23:39   ECHOCARDIOGRAM COMPLETE Result Date: 01/03/2024    ECHOCARDIOGRAM REPORT   Patient Name:   ORCHID GLASSBERG Date of Exam: 01/03/2024 Medical Rec #:  [id number]      Height:       65.0 in Accession #:    [id number]     Weight:       123.7 lb Date of Birth:  18-Oct-1960      BSA:          1.613 m Patient Age:    63 years       BP:           136/89 mmHg Patient Gender: F              HR:           76 bpm. Exam Location:  Inpatient Procedure: 2D Echo, Color Doppler and Cardiac Doppler (Both Spectral and Color            Flow Doppler were utilized during procedure). Indications:    CHF- Acute Systolic I50.21  History:        Patient has prior history of Echocardiogram examinations, most                 recent 05/12/2023.  Sonographer:    Tinnie Gosling RDCS Referring Phys: [id number] OMAR M ALBUSTAMI IMPRESSIONS  1. Left ventricular ejection fraction, by estimation, is 40%. The left ventricle has mild to moderately decreased function. The left ventricle demonstrates global hypokinesis. There is mild concentric left ventricular hypertrophy. Left ventricular diastolic parameters are consistent with Grade I diastolic dysfunction (impaired relaxation).  2. Right ventricular systolic function is mildly reduced. The right ventricular size is mildly enlarged. Tricuspid regurgitation signal is inadequate for assessing PA pressure.  3. The mitral valve is normal in structure. No evidence of mitral valve regurgitation. No evidence of mitral stenosis.  4. The aortic valve is tricuspid. Aortic valve regurgitation is not visualized. No aortic stenosis is present.  5. The inferior vena cava is normal in size with <50% respiratory  variability, suggesting right atrial pressure of 8 mmHg.  6. A small pericardial effusion is present. The pericardial effusion is localized near the right ventricle. FINDINGS  Left Ventricle: Left ventricular ejection fraction, by estimation, is 40%. The left ventricle has mild to moderately decreased function. The left ventricle demonstrates global hypokinesis. The left ventricular internal cavity size was normal in size. There is mild concentric left ventricular hypertrophy. Left ventricular diastolic parameters are consistent with Grade I diastolic dysfunction (impaired relaxation). Right Ventricle: The right ventricular size is mildly enlarged. No increase in right ventricular wall thickness. Right ventricular systolic function is mildly reduced. Tricuspid regurgitation signal is inadequate for assessing PA pressure. Left Atrium: Left atrial size was normal in size. Right Atrium: Right atrial size was normal in size. Pericardium: A small pericardial effusion is present. The pericardial effusion is localized near the right ventricle. Mitral Valve: The mitral valve is normal in structure. Mild mitral annular calcification. No evidence of mitral valve regurgitation. No evidence of mitral valve stenosis. Tricuspid Valve: The tricuspid valve is normal in structure. Tricuspid valve regurgitation is trivial. Aortic Valve: The aortic valve is tricuspid. Aortic valve regurgitation is not visualized. No aortic stenosis is present. Pulmonic Valve: The pulmonic valve was normal in structure. Pulmonic valve regurgitation is not visualized. Aorta: The aortic root is normal in size and structure. Venous: The inferior vena cava is normal in size with less than 50% respiratory variability, suggesting right atrial pressure of 8 mmHg. IAS/Shunts: No atrial level shunt detected by color flow Doppler.  LEFT VENTRICLE PLAX 2D LVIDd:         3.90 cm   Diastology LVIDs:         3.20 cm   LV e' medial:    1.92 cm/s LV PW:         1.20  cm   LV E/e' medial:  45.1 LV IVS:        1.20 cm   LV e' lateral:   2.82 cm/s LVOT diam:     1.80 cm   LV E/e' lateral: 30.7 LV SV:         31 LV SV Index:   19 LVOT Area:     2.54 cm  RIGHT VENTRICLE            IVC RV S prime:     8.66 cm/s  IVC diam: 2.00 cm TAPSE (M-mode): 1.3 cm LEFT ATRIUM             Index        RIGHT ATRIUM           Index LA diam:        2.70 cm 1.67 cm/m  RA Area:     11.00 cm LA Vol (A2C):   29.3 ml 18.17 ml/m  RA Volume:   25.60 ml  15.87 ml/m LA Vol (A4C):   33.1 ml 20.53 ml/m LA Biplane Vol: 33.6 ml 20.84 ml/m  AORTIC VALVE LVOT Vmax:   111.00 cm/s LVOT Vmean:  75.100 cm/s LVOT VTI:    0.123 m  AORTA Ao Root diam: 2.80 cm Ao Asc diam:  3.00 cm MITRAL VALVE MV Area (PHT): 2.46 cm     SHUNTS MV Decel Time: 309 msec     Systemic VTI:  0.12 m MV E velocity: 86.60 cm/s   Systemic Diam: 1.80 cm MV A velocity: 120.00 cm/s MV E/A ratio:  0.72 Dalton McleanMD Electronically signed by Ezra Kanner Signature Date/Time: 01/03/2024/7:15:49 PM    Final    US  EKG SITE RITE Result Date: 01/03/2024 If Site Rite image not attached, placement could not be confirmed due to current cardiac rhythm.  Vitals:   01/04/24 0645 01/04/24 0700 01/04/24 0715 01/04/24 0810  BP:  130/84    Pulse: 81 80 81   Resp: (!) 26 19 18    Temp: 98.8 F (37.1 C) 99 F (37.2 C) 99 F (37.2 C)   TempSrc:      SpO2: 97% 97% 97% 98%  Weight:      Height:       PHYSICAL EXAM General: Critically ill-appearing patient laying in ICU bed.  EVD in place.  Intubated and sedated. Psych: Unable to assess due to patient's condition CV: Regular rate and rhythm on monitor Respiratory: Respirations assisted via mechanical ventilation.  Oral ETT secured with securement of eyes. GI: Abdomen soft and nontender  NEURO:  Mental Status: Intubated and sedated.  Patient does not open eyes.  Patient does not participate with exam.  She does not follow commands.  Speech/Language: Unable to assess due to patient's  condition.  Cranial Nerves:  II: Does not blink to threat throughout.  Pupils are pinpoint and sluggish III, IV, VI: Does not fixate or track. VII: Obscured due to ETT and securement device. VIII: Able to assess, patient does not respond to voice. IX, X: Gag are intact. XI: Head is grossly midline. XII: Does not protrude tongue to command. Motor/Sensory:  Patient withdraws to pain on the right upper and lower extremity in the left lower extremity.  Left upper extremity is flaccid.  Coordination: Does not perform Gait: Deferred for patient safety  Most Recent NIH 23   ASSESSMENT/PLAN [name] is a 63 y.o. female with history of cocaine abuse, CHF, CKD, HTN and DM2 BIB EMS with confusion and lethargy. BP was 209/142 on arrival. CT head revealed an acute right gangliothalamic intraparenchymal hemorrhage with intraventricular extension and associated early hydrocephalus,  admitted for ICU monitoring and critical care.  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 No herniation. Chronic ischemic white matter changes.   CTA head & neck  No emergent large vessel occlusion. Poor opacification of the v1 and v4 segments of the left vertebral artery. Mild atherosclerotic calcification of both carotid bifurcations without hemodynamically significant stenosis.   Repeat CTH:  Medial Right thalamic hematoma, estimated 13 mL and slightly larger since 0220 hours today. Intraventricular blood volume not significantly changed. Stable ventricle  size, and as before difficult to exclude a degree of acute lateral ventriculomegaly. MRI: When comparing across modalities, similar size of an  intraparenchymal hemorrhage centered in the right thalamus and smaller intraparenchymal hemorrhage in the left basal ganglia. Mildly increased volume of intraventricular hemorrhage layering in the occipital horns. Right frontal approach ventriculostomy catheter with some hemorrhage and edema along the catheter tract. Now mild hydrocephalus appears slightly improved in comparison to recent CT head. No convincing transependymal flow of CSF. CT head- Right frontal approach ventriculostomy catheter in place, abutting the septum pellucidum, with new hemorrhage along the posterior margin of the tract, including within the caudate. Decreased size of intrathalamic hemorrhage. UDS: Positive for Cocaine 2D Echo: PENDING LDL ordered, pending HgbA1c 6.6 VTE prophylaxis - SCDs No antithrombotic prior to admission, now on No antithrombotic due to ICH Therapy recommendations:  Pending Disposition:  PENDING  Hydrocephalus S/p ventriculostomy Emergent evd placed 9/22 am by Neurosurgery Repeat imaging pending- MRI AM  Acute respiratory failure secondary to ICH CCM management, appreciate assistance Extubated 9/23  Hypertension Hx of CHF Hx of Pulmonary Edema Home meds: Carvedilol  3.125 mg twice daily, Lasix  20 mg as needed, Entresto  twice daily, spironolactone  25 mg daily Unstable Continue Cleviprex  gtt, wean as tolerated Restart home antihypertensives: Entresto , spironolactone , Coreg  As needed hydralazine  and labetalol  added for better blood pressure control Blood Pressure Goal: SBP between 130-150 for 24 hours and then less than 160   Hyperlipidemia Home meds:  none LDL ordered, goal < 70 Consider statin at discharge as needed  Diabetes type II,  Home meds:  Jardiance , no insulin  use HgbA1c 6.6, goal < 7.0 CBGs, SSI Recommend close follow-up with PCP   Tobacco Abuse Patient is a current smoker      Ready to quit? N/A Nicotine  replacement therapy provided  Substance Abuse Patient uses  crack cocaine UDS positive for Cocaine      Ready to quit? N/A TOC consult for cessation placed  Dysphagia Patient has post-stroke dysphagia, SLP consulted    Diet   Diet NPO time specified   Advance diet as tolerated [facility] day # 1  Patient seen and examined by NP/APP with MD. MD to update note as needed.   Jorene Last, DNP, FNP-BC [facility] Pager: [phone]  I have personally obtained history,examined this patient, reviewed notes, independently viewed imaging studies, participated in medical decision making and plan of care.ROS completed by me personally and pertinent positives fully documented  I have made any additions or clarifications directly to the above note. Agree with note above.  Patient was extubated today.  Continue close neurological observation and strict blood pressure control with systolic goal below 160.  Ventriculostomy drainage and management as per neurosurgery.  Continue ongoing therapies.  No family available at bedside for discussion.  Discussed with Dr. Claudene critical care medicine.  This patient is critically ill and at significant risk of neurological worsening, death and care requires constant monitoring of vital signs, hemodynamics,respiratory and cardiac monitoring, extensive review of multiple databases, frequent neurological assessment, discussion with family, other specialists and medical decision making of high complexity.I have made any additions or clarifications directly to the above note.This critical care time does not reflect procedure time, or teaching time or supervisory time of PA/NP/Med Resident etc but could involve care discussion time.  I spent 30 minutes of neurocritical care time  in the care of  this patient.          Eather Popp, MD Medical  Director [facility] Pager: [phone] 01/04/2024 4:24 PM  01/04/2024 9:39 AM  To contact Stroke Continuity provider, please refer to  [email or web address]. After hours, contact General Neurology

## 2024-01-05 DIAGNOSIS — I5040 Unspecified combined systolic (congestive) and diastolic (congestive) heart failure: Secondary | ICD-10-CM | POA: Diagnosis not present

## 2024-01-05 DIAGNOSIS — I615 Nontraumatic intracerebral hemorrhage, intraventricular: Secondary | ICD-10-CM | POA: Diagnosis not present

## 2024-01-05 DIAGNOSIS — I610 Nontraumatic intracerebral hemorrhage in hemisphere, subcortical: Secondary | ICD-10-CM | POA: Diagnosis not present

## 2024-01-05 DIAGNOSIS — G919 Hydrocephalus, unspecified: Secondary | ICD-10-CM | POA: Diagnosis not present

## 2024-01-05 DIAGNOSIS — R29718 NIHSS score 18: Secondary | ICD-10-CM | POA: Diagnosis not present

## 2024-01-05 DIAGNOSIS — I130 Hypertensive heart and chronic kidney disease with heart failure and stage 1 through stage 4 chronic kidney disease, or unspecified chronic kidney disease: Secondary | ICD-10-CM | POA: Diagnosis not present

## 2024-01-05 DIAGNOSIS — I10 Essential (primary) hypertension: Secondary | ICD-10-CM | POA: Diagnosis not present

## 2024-01-05 LAB — GLUCOSE, CAPILLARY
Glucose-Capillary: 126 mg/dL — ABNORMAL HIGH (ref 70–99)
Glucose-Capillary: 129 mg/dL — ABNORMAL HIGH (ref 70–99)
Glucose-Capillary: 146 mg/dL — ABNORMAL HIGH (ref 70–99)
Glucose-Capillary: 91 mg/dL (ref 70–99)
Glucose-Capillary: 95 mg/dL (ref 70–99)
Glucose-Capillary: 99 mg/dL (ref 70–99)

## 2024-01-05 LAB — BASIC METABOLIC PANEL WITH GFR
Anion gap: 12 (ref 5–15)
BUN: 14 mg/dL (ref 8–23)
CO2: 21 mmol/L — ABNORMAL LOW (ref 22–32)
Calcium: 8.9 mg/dL (ref 8.9–10.3)
Chloride: 104 mmol/L (ref 98–111)
Creatinine, Ser: 0.84 mg/dL (ref 0.44–1.00)
GFR, Estimated: >60 mL/min (ref >60)
Glucose, Bld: 108 mg/dL — ABNORMAL HIGH (ref 70–99)
Potassium: 4.1 mmol/L (ref 3.5–5.1)
Sodium: 137 mmol/L (ref 135–145)

## 2024-01-05 LAB — CBC
HCT: 43.4 % (ref 36.0–46.0)
Hemoglobin: 14.6 g/dL (ref 12.0–15.0)
MCH: 29.4 pg (ref 26.0–34.0)
MCHC: 33.6 g/dL (ref 30.0–36.0)
MCV: 87.5 fL (ref 80.0–100.0)
Platelets: 267 K/uL (ref 150–400)
RBC: 4.96 MIL/uL (ref 3.87–5.11)
RDW: 13.4 % (ref 11.5–15.5)
WBC: 12.1 K/uL — ABNORMAL HIGH (ref 4.0–10.5)
nRBC: 0 % (ref 0.0–0.2)

## 2024-01-05 LAB — MAGNESIUM: Magnesium: 2.1 mg/dL (ref 1.7–2.4)

## 2024-01-05 LAB — PHOSPHORUS: Phosphorus: 3.1 mg/dL (ref 2.5–4.6)

## 2024-01-05 MED ORDER — LABETALOL HCL 5 MG/ML IV SOLN
10.0000 mg | INTRAVENOUS | Status: DC | PRN
  Administered 2024-01-05 – 2024-01-07 (×6): 20 mg via INTRAVENOUS
  Administered 2024-01-09: 10 mg via INTRAVENOUS
  Administered 2024-01-09 – 2024-01-20 (×2): 20 mg via INTRAVENOUS
  Filled 2024-01-05 (×10): qty 4

## 2024-01-05 MED ORDER — HEPARIN SODIUM (PORCINE) 5000 UNIT/ML IJ SOLN
5000.0000 [IU] | Freq: Three times a day (TID) | INTRAMUSCULAR | Status: DC
  Administered 2024-01-05 – 2024-02-21 (×119): 5000 [IU] via SUBCUTANEOUS
  Filled 2024-01-05 (×128): qty 1

## 2024-01-05 MED ORDER — HYDRALAZINE HCL 20 MG/ML IJ SOLN
10.0000 mg | INTRAMUSCULAR | Status: DC | PRN
  Administered 2024-01-06: 20 mg via INTRAVENOUS
  Administered 2024-01-06: 10 mg via INTRAVENOUS
  Administered 2024-01-07 – 2024-01-16 (×7): 20 mg via INTRAVENOUS
  Filled 2024-01-05 (×10): qty 1

## 2024-01-05 NOTE — Procedures (Signed)
 Cortrak  Person Inserting Tube:  Mady Dolly, RD Tube Type:  Cortrak - 43 inches Tube Size:  10 Tube Location:  Left nare Secured by: Bridle Initial Placement:  Gastric Technique Used to Measure Tube Placement:  Marking at nare/corner of mouth Cortrak Secured At:  79 cm   Cortrak Tube Team Note:  Consult received to place a Cortrak feeding tube.   No x-ray is required. RN may begin using tube.   If the tube becomes dislodged please keep the tube and contact the Cortrak team at [email or web address] for replacement.  If after hours and replacement cannot be delayed, place a NG tube and confirm placement with an abdominal x-ray.   Dolly Mady MS, RD, LDN Registered Dietitian Clinical Nutrition RD Inpatient Contact Info in Amion

## 2024-01-05 NOTE — Progress Notes (Signed)
  Inpatient Rehab Admissions Coordinator :  Per therapy recommendations, patient was screened for CIR candidacy by Ottie Glazier RN MSN.  At this time patient appears to be a potential candidate for CIR. I will place a rehab consult per protocol for full assessment. Please call me with any questions.  Ottie Glazier RN MSN Admissions Coordinator [phone]

## 2024-01-05 NOTE — Evaluation (Signed)
 Occupational Therapy Evaluation Patient Details Name: Amanda [name] MRN: [id number] DOB: March 09, 1961 Today's Date: 01/05/2024   History of Present Illness   63 yo female presents to [facility] on 9/22 with lethargy and near-syncope s/p cocaine and opioid ingestion. 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 S/p R frontal ventriculostomy, burr holes 9/22. ETT 9/22-9/23. PMHx of cocaine abuse, CHF, CKD, chronic cough, HTN and DM2.     Clinical Impressions Pt with minimal verbalizations during session, unable to obtain much PLOF/history. Pt reports ind and lives alone. Pt with impaired cognition, L inattention, R gaze and head turn preference. Pt noted to visually track slightly past midline to L but does not sustain gaze to L. Pt needs up to max A for ADLs, min-max A+2 for bed mobility and min +2 for transfer with 2 person HHA. Pt presenting with impairments listed below, will follow acutely. Patient will benefit from intensive inpatient follow-up therapy, >3 hours/day to maximize safety/ind with ADL/functional mobility.      If plan is discharge home, recommend the following:   Two people to help with walking and/or transfers;A lot of help with bathing/dressing/bathroom;Assistance with cooking/housework;Direct supervision/assist for medications management;Direct supervision/assist for financial management;Assist for transportation;Help with stairs or ramp for entrance     Functional Status Assessment   Patient has had a recent decline in their functional status and demonstrates the ability to make significant improvements in function in a reasonable and predictable amount of time.     Equipment Recommendations   Other (comment) (defer)     Recommendations for Other Services   PT consult;Rehab consult     Precautions/Restrictions   Precautions Precautions: Fall;Other (comment) Recall of  Precautions/Restrictions: Impaired Restrictions Weight Bearing Restrictions Per Provider Order: No     Mobility Bed Mobility Overal bed mobility: Needs Assistance Bed Mobility: Supine to Sit, Sit to Supine     Supine to sit: Min assist, +2 for safety/equipment Sit to supine: Max assist, +2 for physical assistance        Transfers Overall transfer level: Needs assistance Equipment used: 2 person hand held assist Transfers: Sit to/from Stand Sit to Stand: Min assist, +2 physical assistance                  Balance Overall balance assessment: Needs assistance Sitting-balance support: Feet supported Sitting balance-Leahy Scale: Poor Sitting balance - Comments: minA, anterior lean   Standing balance support: Bilateral upper extremity supported Standing balance-Leahy Scale: Poor Standing balance comment: reliant on external support from therapist                           ADL either performed or assessed with clinical judgement   ADL Overall ADL's : Needs assistance/impaired Eating/Feeding: NPO   Grooming: Moderate assistance   Upper Body Bathing: Moderate assistance   Lower Body Bathing: Maximal assistance   Upper Body Dressing : Moderate assistance   Lower Body Dressing: Maximal assistance   Toilet Transfer: Minimal assistance;+2 for physical assistance   Toileting- Clothing Manipulation and Hygiene: Moderate assistance       Functional mobility during ADLs: Minimal assistance;+2 for physical assistance       Vision   Vision Assessment?: Yes;Vision impaired- to be further tested in functional context Eye Alignment: Impaired (comment) Alignment/Gaze Preference: Gaze right;Head turned (head turn R) Tracking/Visual Pursuits: Other (comment) (turns head to L with cues and tracks slightly past midline but not all  the way to L) Visual Fields: Left visual field deficit (questionable)     Perception Perception: Impaired Preception Impairment  Details: Inattention/Neglect Perception-Other Comments: L inattn   Praxis Praxis: Not tested       Pertinent Vitals/Pain Pain Assessment Pain Assessment: Faces Pain Score: 2  Faces Pain Scale: Hurts a little bit Pain Location: R hand to touch Pain Descriptors / Indicators: Tender Pain Intervention(s): Limited activity within patient's tolerance, Monitored during session, Repositioned     Extremity/Trunk Assessment Upper Extremity Assessment Upper Extremity Assessment: Generalized weakness;RUE deficits/detail;LUE deficits/detail RUE Deficits / Details: painful to touch, moves spontaneously against gravity LUE Deficits / Details: 3/5, difficult assessment 2/2 cognition   Lower Extremity Assessment Lower Extremity Assessment: Defer to PT evaluation RLE Deficits / Details: Moves spontaneously, at least 3/5 strength LLE Deficits / Details: Weaker than RLE, difficult to assess due to cognition. Able to wiggle toes   Cervical / Trunk Assessment Cervical / Trunk Assessment: Normal   Communication Communication Communication: Impaired Factors Affecting Communication: Reduced clarity of speech (hoarse voice quality)   Cognition Arousal: Alert Behavior During Therapy: Flat affect Cognition: Cognition impaired   Orientation impairments: Situation, Time, Place Awareness: Online awareness impaired, Intellectual awareness impaired Memory impairment (select all impairments): Short-term memory Attention impairment (select first level of impairment): Sustained attention Executive functioning impairment (select all impairments): Problem solving, Reasoning OT - Cognition Comments: states Sep 18 for DOB,but does not answer other orientation questions, follows < 50% of commands, incr distraction on oral secretions and why they are happening when she talks                 Following commands: Impaired Following commands impaired: Follows one step commands with increased time      Cueing  General Comments   Cueing Techniques: Verbal cues;Gestural cues;Tactile cues;Visual cues  VSS on RA   Exercises     Shoulder Instructions      Home Living Family/patient expects to be discharged to:: Private residence Living Arrangements: Spouse/significant other                               Additional Comments: pt not verbalizing much during session, states she is ind and lives alone in a house      Prior Functioning/Environment Prior Level of Function : Independent/Modified Independent             Mobility Comments: Assume independent ADLs Comments: ind per pt, pt questionable historian    OT Problem List: Decreased strength;Decreased range of motion;Decreased activity tolerance;Impaired balance (sitting and/or standing);Decreased cognition;Decreased coordination;Decreased safety awareness;Cardiopulmonary status limiting activity   OT Treatment/Interventions: Self-care/ADL training;Therapeutic exercise;Energy conservation;DME and/or AE instruction;Therapeutic activities;Visual/perceptual remediation/compensation;Patient/family education;Balance training;Cognitive remediation/compensation;Neuromuscular education      OT Goals(Current goals can be found in the care plan section)   Acute Rehab OT Goals Patient Stated Goal: pt unable to state OT Goal Formulation: Patient unable to participate in goal setting Time For Goal Achievement: 01/19/24 Potential to Achieve Goals: Good ADL Goals Pt Will Perform Grooming: with supervision;sitting Pt Will Transfer to Toilet: with min assist;squat pivot transfer;stand pivot transfer;bedside commode Additional ADL Goal #1: pt will track and locate items on L side with min cues in prep for ADLs   OT Frequency:  Min 2X/week    Co-evaluation PT/OT/SLP Co-Evaluation/Treatment: Yes Reason for Co-Treatment: Complexity of the patient's impairments (multi-system involvement);Necessary to address cognition/behavior  during functional activity;For patient/therapist safety;To address functional/ADL transfers PT goals  addressed during session: Mobility/safety with mobility OT goals addressed during session: ADL's and self-care;Strengthening/ROM      AM-PAC OT 6 Clicks Daily Activity     Outcome Measure Help from another person eating meals?: Total Help from another person taking care of personal grooming?: A Lot Help from another person toileting, which includes using toliet, bedpan, or urinal?: A Lot Help from another person bathing (including washing, rinsing, drying)?: A Lot Help from another person to put on and taking off regular upper body clothing?: A Lot Help from another person to put on and taking off regular lower body clothing?: A Lot 6 Click Score: 11   End of Session Nurse Communication: Mobility status  Activity Tolerance: Patient tolerated treatment well Patient left: in bed;with call bell/phone within reach;with bed alarm set;with restraints reapplied (R wrist restraint, L mitt, posey belt restraint)  OT Visit Diagnosis: Unsteadiness on feet (R26.81);Other abnormalities of gait and mobility (R26.89);Muscle weakness (generalized) (M62.81)                Time: 8877-8848 OT Time Calculation (min): 29 min Charges:  OT General Charges $OT Visit: 1 Visit OT Evaluation $OT Eval Moderate Complexity: 1 Mod  Lonni Dirden K, OTD, OTR/L SecureChat Preferred Acute Rehab [phone]   Mikaella Escalona K Koonce 01/05/2024, 4:55 PM

## 2024-01-05 NOTE — Progress Notes (Addendum)
   NAME:  NEELY KAMMERER, MRN:  [id number], DOB:  11/08/1960, LOS: 2 ADMISSION DATE:  01/03/2024, CONSULTATION DATE:  01/03/2024 REFERRING MD: Carita Senior, MD, CHIEF COMPLAINT:  AMS for one hour   History of Present Illness:  [name] with HTN, CHF (systolic and diastolic, EF 30%, G3DD, RVSP 50.3 mmHg), DM-2, and polysubstance/cocaine abuse 3 hrs before presentation, who called EMS for AMS, lethargy and near syncope. Her BP in ED 180/120, repeat BP 220/126. She was oriented to person only. She denied headache, chest pain, shortness of breath, abdominal pain, nausea and vomiting. Unable to give any meaningful history. Her condition deteriorated (unable to protect her airway and more lethargic) and got intubated. Clevidipine  was started for BP control and Propofol  for sedation. On NS 0.9% infusion.   Pertinent  Medical History  HTN, CHF systolic and diastolic (Jan 2025 Echo: EF 30%, G3DD, RVSP 50.3 mmHg), DM-2, polysubstance/cocaine abuse  Significant Hospital Events: Including procedures, antibiotic start and stop dates in addition to other pertinent events   01/03/2024: ED eval 9/22: PCCM called for admission   Interim History / Subjective:  Sedated on vent.  Objective    Blood pressure 125/83, pulse 70, temperature (!) 100.4 F (38 C), temperature source Axillary, resp. rate (!) 24, height 5' 5 (1.651 m), weight 56.3 kg, last menstrual period 12/24/2015, SpO2 99%.    Vent Mode: PRVC FiO2 (%):  [40 %-45 %] 42 % PEEP:  [5 cmH20] 5 cmH20 Pressure Support:  [5 cmH20] 5 cmH20   Intake/Output Summary (Last 24 hours) at 01/05/2024 0717 Last data filed at 01/05/2024 0700 Gross per 24 hour  Intake 1113.1 ml  Output 831 ml  Net 282.1 ml   Filed Weights   01/03/24 0504 01/04/24 0500 01/05/24 0500  Weight: 56.1 kg 53.3 kg 56.3 kg   No acute events overnight, EVD at 10. Precedex  and Cleviprex  DC'd this a.m. 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

## 2024-01-05 NOTE — Progress Notes (Addendum)
 STROKE TEAM PROGRESS NOTE    SIGNIFICANT HOSPITAL EVENTS 9/21: Brought in by EMS due to confusion and lethargy.  Patient called EMS themselves.  Admitted to using cocaine approximately 3 hours before. 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  9/22.: Emergent Burr holes and right frontal ventriculostomy placed by neurosurgery due to neurological worsening. 9/23: Extubated  INTERIM HISTORY/SUBJECTIVE Patient is lying comfortably in bed.  Not on sedation.  Intermittently gets agitated, moves all extremities. Continue EVD at 10 cm H2O No family at the bedside.  Blood pressure adequately controlled OBJECTIVE CBC    Component Value Date/Time   WBC 12.1 (H) 01/05/2024 0428   RBC 4.96 01/05/2024 0428   HGB 14.6 01/05/2024 0428   HCT 43.4 01/05/2024 0428   PLT 267 01/05/2024 0428   MCV 87.5 01/05/2024 0428   MCH 29.4 01/05/2024 0428   MCHC 33.6 01/05/2024 0428   RDW 13.4 01/05/2024 0428   LYMPHSABS 1.9 01/03/2024 0145   MONOABS 0.6 01/03/2024 0145   EOSABS 0.1 01/03/2024 0145   BASOSABS 0.0 01/03/2024 0145   BMET    Component Value Date/Time   NA 137 01/05/2024 0428   K 4.1 01/05/2024 0428   CL 104 01/05/2024 0428   CO2 21 (L) 01/05/2024 0428   GLUCOSE 108 (H) 01/05/2024 0428   BUN 14 01/05/2024 0428   CREATININE 0.84 01/05/2024 0428   CREATININE 0.90 11/01/2013 1024   CALCIUM 8.9 01/05/2024 0428   GFRNONAA >60 01/05/2024 0428   GFRNONAA 74 11/01/2013 1024   Lab Results  Component Value Date   HGBA1C 6.6 (H) 01/03/2024   Lab Results  Component Value Date   CHOL 149 01/03/2024   HDL 60 01/03/2024   LDLCALC 77 01/03/2024   TRIG 167 (H) 01/04/2024   CHOLHDL 2.5 01/03/2024   Drugs of Abuse     Component Value Date/Time   LABOPIA NONE DETECTED 01/03/2024 0459   COCAINSCRNUR POSITIVE (A) 01/03/2024 0459   LABBENZ NONE DETECTED 01/03/2024 0459   AMPHETMU NONE DETECTED 01/03/2024 0459   THCU NONE DETECTED 01/03/2024 0459   LABBARB  NONE DETECTED 01/03/2024 0459    Urinalysis    Component Value Date/Time   COLORURINE YELLOW 01/03/2024 0459   APPEARANCEUR CLEAR 01/03/2024 0459   LABSPEC >1.046 (H) 01/03/2024 0459   PHURINE 6.0 01/03/2024 0459   GLUCOSEU 50 (A) 01/03/2024 0459   HGBUR NEGATIVE 01/03/2024 0459   BILIRUBINUR NEGATIVE 01/03/2024 0459   BILIRUBINUR Small 10/11/2013 1200   KETONESUR NEGATIVE 01/03/2024 0459   PROTEINUR 30 (A) 01/03/2024 0459   UROBILINOGEN 0.2 10/11/2013 1200   UROBILINOGEN 0.2 02/28/2008 1612   NITRITE NEGATIVE 01/03/2024 0459   LEUKOCYTESUR NEGATIVE 01/03/2024 0459   IMAGING past 24 hours No results found.  Vitals:   01/05/24 1300 01/05/24 1330 01/05/24 1345 01/05/24 1400  BP: (!) 154/95 (!) 156/104 (!) 151/101 (!) 138/90  Pulse: 74 75 69 65  Resp: (!) 24 (!) 28 20 (!) 22  Temp:      TempSrc:      SpO2: 96% 95% 97% 97%  Weight:      Height:       PHYSICAL EXAM General: Critically ill-appearing patient laying in ICU bed.  EVD in place. Psych: Unable to assess due to patient's condition CV: Regular rate and rhythm on monitor Respiratory: On 5L [place]  GI: Abdomen soft and nontender  NEURO:  Mental Status: Agitated, no verbal output, not following commands  Speech/Language:  agitated but no verbal output.  Cranial Nerves:  II: Does not blink to threat consistently  III, IV, VI: Does not fixate or track. VII:  VIII: Able to assess, patient does not respond to voice. IX, X: Gag are intact. XI: Head is grossly midline. XII: Does not protrude tongue to command. Motor/Sensory:  Moves all extremities but left upper extremity and left lower extremity weaker than right  Resists passive ROM Coordination: Does not perform Gait: Deferred for patient safety  Most Recent NIH 18   ASSESSMENT/PLAN [name] is a 63 y.o. female with history of cocaine abuse, CHF, CKD, HTN and DM2 BIB EMS with confusion and lethargy. BP was 209/142 on arrival. CT head revealed an acute  right gangliothalamic intraparenchymal hemorrhage with intraventricular extension and associated early hydrocephalus,  admitted for ICU monitoring and critical care.  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 No herniation. Chronic ischemic white matter changes.   CTA head & neck  No emergent large vessel occlusion. Poor opacification of the v1 and v4 segments of the left vertebral artery. Mild atherosclerotic calcification of both carotid bifurcations without hemodynamically significant stenosis.   Repeat CTH:  Medial Right thalamic hematoma, estimated 13 mL and slightly larger since 0220 hours today. Intraventricular blood volume not significantly changed. Stable ventricle size, and as before difficult to exclude a degree of acute lateral ventriculomegaly. MRI: When comparing across modalities, similar size of an intraparenchymal hemorrhage centered in the right thalamus and smaller intraparenchymal hemorrhage in the left basal ganglia. Mildly increased volume of intraventricular hemorrhage layering in the occipital horns. Right frontal approach ventriculostomy catheter with some hemorrhage and edema along the catheter tract. Now mild hydrocephalus appears slightly improved in comparison to recent CT head. No convincing transependymal flow of CSF. CT head- Right frontal approach ventriculostomy catheter in place, abutting the septum pellucidum, with new hemorrhage along the posterior margin of the tract, including within the caudate. Decreased size of intrathalamic hemorrhage. UDS: Positive for Cocaine 2D Echo: EF 40% LDL 77 HgbA1c 6.6 VTE prophylaxis - SCDs No antithrombotic prior to  admission, now on No antithrombotic due to ICH Therapy recommendations:  CIR Disposition:  PENDING  Hydrocephalus S/p ventriculostomy Emergent evd placed 9/22 am by Neurosurgery Repeat imaging pending- MRI AM  Acute respiratory failure secondary to ICH CCM management, appreciate assistance Extubated 9/23  Hypertension Hx of CHF Hx of Pulmonary Edema Home meds: Carvedilol  3.125 mg twice daily, Lasix  20 mg as needed, Entresto  twice daily, spironolactone  25 mg daily Unstable Continue Cleviprex  gtt, wean as tolerated Restart home antihypertensives: Entresto , spironolactone , Coreg  As needed hydralazine  and labetalol  added for better blood pressure control Blood Pressure Goal: SBP between 130-150 for 24 hours and then less than 160   Hyperlipidemia Home meds:  none LDL ordered, goal < 70 Consider statin at discharge as needed  Diabetes type II,  Home meds:  Jardiance , no insulin  use HgbA1c 6.6, goal < 7.0 CBGs, SSI Recommend close follow-up with PCP   Tobacco Abuse Patient is a current smoker      Ready to quit? N/A Nicotine  replacement therapy provided  Substance Abuse Patient uses crack cocaine UDS positive for Cocaine      Ready to quit? N/A TOC consult for cessation placed  Dysphagia Patient has post-stroke dysphagia, SLP consulted  Diet   Diet NPO time specified   Advance diet as tolerated CorTrak in place    Hospital day # 2  Patient seen and examined by NP/APP with MD. MD to update note as needed.   Jorene Last, DNP, FNP-BC [facility] Pager: [phone]  I have personally obtained history,examined this patient, reviewed notes, independently viewed imaging studies, participated in medical decision making and plan of care.ROS completed by me personally and pertinent positives fully documented  I have made any additions or clarifications directly to the above note. Agree with note above.  Continue ventriculostomy drainage and wean as  tolerated per neurosurgery.  Continue strict control of hypertension systolic goal below 160.  Speech therapy for swallow eval and if patient fails will need Cortrak tube for feeding.  Discussed with Dr. Venia critical care medicine. This patient is critically ill and at significant risk of neurological worsening, death and care requires constant monitoring of vital signs, hemodynamics,respiratory and cardiac monitoring, extensive review of multiple databases, frequent neurological assessment, discussion with family, other specialists and medical decision making of high complexity.I have made any additions or clarifications directly to the above note.This critical care time does not reflect procedure time, or teaching time or supervisory time of PA/NP/Med Resident etc but could involve care discussion time.  I spent 30 minutes of neurocritical care time  in the care of  this patient.     Eather Popp, MD Medical Director [facility] Pager: [phone] 01/05/2024 4:26 PM   To contact Stroke Continuity provider, please refer to [email or web address]. After hours, contact General Neurology

## 2024-01-05 NOTE — Evaluation (Addendum)
 Physical Therapy Evaluation Patient Details Name: Amanda [name] MRN: [id number] DOB: October 14, 1960 Today's Date: 01/05/2024  History of Present Illness  63 yo female presents to [facility] on 9/22 with lethargy and near-syncope s/p cocaine and opioid ingestion. 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 S/p R frontal ventriculostomy, burr holes 9/22. ETT 9/22-9/23. PMHx of cocaine abuse, CHF, CKD, chronic cough, HTN and DM2.  Clinical Impression  Pt admitted with above. Assume pt was independent prior to admission. Pt following < 50% of one step commands with intermittent verbalizations. Pt asking why she is having secretions, but seems to have decreased comprehension of situation and deficits. Pt requiring min assist for bed mobility and transfers to standing (+2 safety). Able to take 1-2 lateral steps edge of bed, but due to reports of dizziness, returned to supine. Pt demonstrates left sided weakness (upper extremity more impaired than lower), impaired cognition, balance, R gaze preference (able to cross midline briefly) and potential left inattention vs visual field deficit. Patient will benefit from intensive inpatient follow-up therapy, >3 hours/day.        If plan is discharge home, recommend the following: A lot of help with walking and/or transfers;A little help with bathing/dressing/bathroom   Can travel by private vehicle        Equipment Recommendations Other (comment) (TBA)  Recommendations for Other Services  Rehab consult    Functional Status Assessment Patient has had a recent decline in their functional status and demonstrates the ability to make significant improvements in function in a reasonable and predictable amount of time.     Precautions / Restrictions Precautions Precautions: Fall;Other (comment) Recall of Precautions/Restrictions: Impaired Precaution/Restrictions Comments: EVD  (clamp prior to mobility), SBP < 160, R wrist restraint, bilateral mitts, posey, a line Restrictions Weight Bearing Restrictions Per Provider Order: No      Mobility  Bed Mobility Overal bed mobility: Needs Assistance Bed Mobility: Supine to Sit, Sit to Supine     Supine to sit: Min assist, +2 for safety/equipment Sit to supine: Max assist, +2 for physical assistance   General bed mobility comments: Light minA to negotiate LLE off edge of bed, assist for lines/safety. MaxA + 2 to return to supine due to decreased initiation    Transfers Overall transfer level: Needs assistance Equipment used: 2 person hand held assist Transfers: Sit to/from Stand Sit to Stand: Min assist, +2 physical assistance           General transfer comment: MinA + 2 to rise to stand, no L knee buckle, able to take one lateral step towards R    Ambulation/Gait               General Gait Details: deferred this session  Stairs            Wheelchair Mobility     Tilt Bed    Modified Rankin (Stroke Patients Only) Modified Rankin (Stroke Patients Only) Pre-Morbid Rankin Score: No symptoms Modified Rankin: Moderately severe disability     Balance Overall balance assessment: Needs assistance Sitting-balance support: Feet supported Sitting balance-Leahy Scale: Poor Sitting balance - Comments: Requiring minA, anterior lean   Standing balance support: Bilateral upper extremity supported Standing balance-Leahy Scale: Poor Standing balance comment: reliant on external support from therapist                             Pertinent Vitals/Pain Pain Assessment Pain Assessment:  Faces Faces Pain Scale: Hurts a little bit Pain Location: R hand to touch Pain Descriptors / Indicators: Tender Pain Intervention(s): Monitored during session    Home Living Family/patient expects to be discharged to:: Private residence Living Arrangements: Spouse/significant other                  Additional Comments: pt not verbalizing much during session, states she is ind and lives alone in a house    Prior Function Prior Level of Function : Independent/Modified Independent             Mobility Comments: Assume independent ADLs Comments: ind per pt, pt questionable historian     Extremity/Trunk Assessment   Upper Extremity Assessment Upper Extremity Assessment: Generalized weakness    Lower Extremity Assessment Lower Extremity Assessment: Defer to PT evaluation RLE Deficits / Details: Moves spontaneously, at least 3/5 strength LLE Deficits / Details: Weaker than RLE, difficult to assess due to cognition. Able to wiggle toes    Cervical / Trunk Assessment Cervical / Trunk Assessment: Normal  Communication   Communication Communication: Impaired Factors Affecting Communication: Reduced clarity of speech (hoarse voice quality)    Cognition Arousal: Alert Behavior During Therapy: Flat affect   PT - Cognitive impairments: Orientation, Awareness, Memory, Attention, Initiation, Sequencing, Problem solving, Safety/Judgement   Orientation impairments: Place, Time, Situation                   PT - Cognition Comments: Pt with flat affect, intermittently responds to questions, able to state birthday is in September. Pt asking why she is having secretions and after answering, pt asking again. <50% command following Following commands: Impaired Following commands impaired: Follows one step commands with increased time     Cueing Cueing Techniques: Verbal cues, Gestural cues, Tactile cues, Visual cues     General Comments General comments (skin integrity, edema, etc.): VSS on RA    Exercises     Assessment/Plan    PT Assessment Patient needs continued PT services  PT Problem List Decreased strength;Decreased activity tolerance;Decreased balance;Decreased mobility;Decreased cognition;Decreased safety awareness       PT Treatment Interventions  DME instruction;Gait training;Stair training;Functional mobility training;Therapeutic activities;Therapeutic exercise;Balance training;Patient/family education    PT Goals (Current goals can be found in the Care Plan section)  Acute Rehab PT Goals Patient Stated Goal: unable PT Goal Formulation: With patient Time For Goal Achievement: 01/19/24 Potential to Achieve Goals: Fair    Frequency Min 3X/week     Co-evaluation PT/OT/SLP Co-Evaluation/Treatment: Yes Reason for Co-Treatment: Complexity of the patient's impairments (multi-system involvement);Necessary to address cognition/behavior during functional activity;For patient/therapist safety;To address functional/ADL transfers PT goals addressed during session: Mobility/safety with mobility OT goals addressed during session: ADL's and self-care;Strengthening/ROM       AM-PAC PT 6 Clicks Mobility  Outcome Measure Help needed turning from your back to your side while in a flat bed without using bedrails?: A Little Help needed moving from lying on your back to sitting on the side of a flat bed without using bedrails?: A Little Help needed moving to and from a bed to a chair (including a wheelchair)?: A Lot Help needed standing up from a chair using your arms (e.g., wheelchair or bedside chair)?: A Lot Help needed to walk in hospital room?: Total Help needed climbing 3-5 steps with a railing? : Total 6 Click Score: 12    End of Session Equipment Utilized During Treatment: Gait belt Activity Tolerance: Patient tolerated treatment well Patient left: in bed;with  call bell/phone within reach;with bed alarm set;with restraints reapplied Nurse Communication: Mobility status PT Visit Diagnosis: Unsteadiness on feet (R26.81);Difficulty in walking, not elsewhere classified (R26.2)    Time: 1120-1150 PT Time Calculation (min) (ACUTE ONLY): 30 min   Charges:   PT Evaluation $PT Eval High Complexity: 1 High   PT General Charges $$  ACUTE PT VISIT: 1 Visit         Aleck Daring, PT, DPT Acute Rehabilitation Services Office [phone]   Aleck ONEIDA Daring 01/05/2024, 2:51 PM

## 2024-01-05 NOTE — Progress Notes (Signed)
    Providing Compassionate, Quality Care - Together   NEUROSURGERY PROGRESS NOTE     S: NAEs o/n.    O: EXAM:  BP 125/83   Pulse 70   Temp (!) 97.5 F (36.4 C) (Axillary)   Resp (!) 24   Ht 5' 5 (1.651 m)   Wt 56.3 kg   LMP 12/24/2015 (Within Days)   SpO2 99%   BMI 20.65 kg/m   Drowsy, eyes open spontaneously Follows commands RUE Localizes to pain BLE No w/d to pain LUE EVD tidaling, blood tinged    ASSESSMENT:  63 y.o. with IPH with IVH, s/p EVD     PLAN: -Continue EVD at 10 cm H2O  -Call w/ questions/concerns.   Camie Pickle, [facility]

## 2024-01-05 NOTE — Plan of Care (Signed)
  Problem: Safety: Goal: Non-violent Restraint(s) Outcome: Progressing   Problem: Skin Integrity: Goal: Risk for impaired skin integrity will decrease Outcome: Progressing   Problem: Pain Managment: Goal: General experience of comfort will improve and/or be controlled Outcome: Progressing

## 2024-01-06 DIAGNOSIS — I5040 Unspecified combined systolic (congestive) and diastolic (congestive) heart failure: Secondary | ICD-10-CM | POA: Diagnosis not present

## 2024-01-06 DIAGNOSIS — R29718 NIHSS score 18: Secondary | ICD-10-CM | POA: Diagnosis not present

## 2024-01-06 DIAGNOSIS — I610 Nontraumatic intracerebral hemorrhage in hemisphere, subcortical: Secondary | ICD-10-CM | POA: Diagnosis not present

## 2024-01-06 DIAGNOSIS — G919 Hydrocephalus, unspecified: Secondary | ICD-10-CM | POA: Diagnosis not present

## 2024-01-06 DIAGNOSIS — I10 Essential (primary) hypertension: Secondary | ICD-10-CM | POA: Diagnosis not present

## 2024-01-06 DIAGNOSIS — I130 Hypertensive heart and chronic kidney disease with heart failure and stage 1 through stage 4 chronic kidney disease, or unspecified chronic kidney disease: Secondary | ICD-10-CM | POA: Diagnosis not present

## 2024-01-06 DIAGNOSIS — I615 Nontraumatic intracerebral hemorrhage, intraventricular: Secondary | ICD-10-CM | POA: Diagnosis not present

## 2024-01-06 LAB — PHOSPHORUS: Phosphorus: 3 mg/dL (ref 2.5–4.6)

## 2024-01-06 LAB — CBC
HCT: 46 % (ref 36.0–46.0)
Hemoglobin: 15.2 g/dL — ABNORMAL HIGH (ref 12.0–15.0)
MCH: 28.6 pg (ref 26.0–34.0)
MCHC: 33 g/dL (ref 30.0–36.0)
MCV: 86.5 fL (ref 80.0–100.0)
Platelets: 319 K/uL (ref 150–400)
RBC: 5.32 MIL/uL — ABNORMAL HIGH (ref 3.87–5.11)
RDW: 13.4 % (ref 11.5–15.5)
WBC: 9.1 K/uL (ref 4.0–10.5)
nRBC: 0 % (ref 0.0–0.2)

## 2024-01-06 LAB — GLUCOSE, CAPILLARY
Glucose-Capillary: 103 mg/dL — ABNORMAL HIGH (ref 70–99)
Glucose-Capillary: 149 mg/dL — ABNORMAL HIGH (ref 70–99)
Glucose-Capillary: 167 mg/dL — ABNORMAL HIGH (ref 70–99)
Glucose-Capillary: 103 mg/dL — ABNORMAL HIGH (ref 70–99)
Glucose-Capillary: 141 mg/dL — ABNORMAL HIGH (ref 70–99)

## 2024-01-06 LAB — BASIC METABOLIC PANEL WITH GFR
Anion gap: 10 (ref 5–15)
BUN: 20 mg/dL (ref 8–23)
CO2: 20 mmol/L — ABNORMAL LOW (ref 22–32)
Calcium: 8.7 mg/dL — ABNORMAL LOW (ref 8.9–10.3)
Chloride: 105 mmol/L (ref 98–111)
Creatinine, Ser: 0.92 mg/dL (ref 0.44–1.00)
GFR, Estimated: >60 mL/min (ref >60)
Glucose, Bld: 147 mg/dL — ABNORMAL HIGH (ref 70–99)
Potassium: 4.5 mmol/L (ref 3.5–5.1)
Sodium: 135 mmol/L (ref 135–145)

## 2024-01-06 LAB — MAGNESIUM: Magnesium: 2 mg/dL (ref 1.7–2.4)

## 2024-01-06 MED ORDER — POLYETHYLENE GLYCOL 3350 17 G PO PACK
17.0000 g | PACK | Freq: Two times a day (BID) | ORAL | Status: DC
  Administered 2024-01-08 – 2024-01-09 (×2): 17 g
  Filled 2024-01-06 (×2): qty 1

## 2024-01-06 MED ORDER — QUETIAPINE FUMARATE 25 MG PO TABS
50.0000 mg | ORAL_TABLET | Freq: Every day | ORAL | Status: DC
  Administered 2024-01-06 – 2024-01-08 (×3): 50 mg
  Filled 2024-01-06 (×3): qty 2

## 2024-01-06 MED ORDER — ORAL CARE MOUTH RINSE
15.0000 mL | OROMUCOSAL | Status: DC
  Administered 2024-01-06 – 2024-01-08 (×8): 15 mL via OROMUCOSAL

## 2024-01-06 MED ORDER — IPRATROPIUM-ALBUTEROL 0.5-2.5 (3) MG/3ML IN SOLN
3.0000 mL | RESPIRATORY_TRACT | Status: DC | PRN
  Administered 2024-01-06: 3 mL via RESPIRATORY_TRACT
  Filled 2024-01-06: qty 3

## 2024-01-06 MED ORDER — LORAZEPAM 2 MG/ML IJ SOLN
INTRAMUSCULAR | Status: AC
  Filled 2024-01-06: qty 1

## 2024-01-06 MED ORDER — IPRATROPIUM-ALBUTEROL 0.5-2.5 (3) MG/3ML IN SOLN
RESPIRATORY_TRACT | Status: AC
  Filled 2024-01-06: qty 3

## 2024-01-06 MED ORDER — CARVEDILOL 12.5 MG PO TABS
25.0000 mg | ORAL_TABLET | Freq: Two times a day (BID) | ORAL | Status: DC
  Administered 2024-01-06 – 2024-01-09 (×6): 25 mg
  Filled 2024-01-06 (×6): qty 2

## 2024-01-06 MED ORDER — HYDROXYZINE HCL 10 MG PO TABS
10.0000 mg | ORAL_TABLET | Freq: Three times a day (TID) | ORAL | Status: DC | PRN
Start: 2024-01-06 — End: 2024-01-11
  Administered 2024-01-09 – 2024-01-10 (×3): 10 mg via ORAL
  Filled 2024-01-06 (×5): qty 1

## 2024-01-06 MED ORDER — ORAL CARE MOUTH RINSE: 15.0000 mL | OROMUCOSAL | Status: DC | PRN

## 2024-01-06 MED ORDER — LORAZEPAM 2 MG/ML IJ SOLN
0.5000 mg | Freq: Once | INTRAMUSCULAR | Status: AC
  Administered 2024-01-06: 0.5 mg via INTRAVENOUS

## 2024-01-06 NOTE — Progress Notes (Signed)
   01/05/24 1100  SLP Visit Information  SLP Received On 01/05/24  General Information  HPI [name] with polysubstance/cocaine abuse 3 hrs before presentation, who called EMS for AMS, lethargy and near syncope. Her condition deteriorated (unable to protect her airway and more lethargic) and got intubated. Clevidipine  was started for BP control and Propofol  for sedation. On NS 0.9% infusion. Intubated 9/22-9/24. HTN, CHF (systolic and diastolic, EF 30%, G3DD, RVSP 50.3 mmHg), DM-2, and  Type of Study Bedside Swallow Evaluation  Previous Swallow Assessment none  Diet Prior to this Study NPO  Temperature Spikes Noted No  History of Recent Intubation Yes  Total duration of intubation (days) 2 days  Date extubated 01/05/24  Behavior/Cognition Alert;Doesn't follow directions;Uncooperative  Patient Positioning Upright in bed  Baseline Vocal Quality Normal  Oral Motor/Sensory Function  Overall Oral Motor/Sensory Function Other (comment) (does not f/c, no focal weakness)  Ice Chips  Ice chips NT  Thin Liquid  Thin Liquid NT  SLP Assessment  Clinical Impression Statement (ACUTE ONLY) Pt non participatory with exam. She was alert, upright, actively avoiding eye contact with therapist, looking at the TV. Resisted any assist to follow oral motor commands. When offered ice, she turned her head away. When offered water  she said no, stop, please stop. SLP attempted removing restraint for self feeding. pt also resisted this. Tried reasoning with pt providing rationale and offering sips of coffee or soda. Pt still resisted and said no. Given that vocal quality is clear and no focal oral motor weakness is observed, suspect pt will likely swallow well. Asked RN to continue to offer sips of whatever she may want today. If no signs of aspiration are seen and pt will accept medication, hopeful to avoid a cortrak. Pts participation and trust in staff needs to improve.  SLP Visit Diagnosis  Dysphagia, unspecified (R13.10)  Impact on safety and function Risk for inadequate nutrition/hydration  Swallow Evaluation Recommendations  SLP Diet Recommendations Other (Comment);Thin liquid (offer sips of thin)  Liquid Administration via Straw  Treatment Plan  Oral Care Recommendations Oral care BID  Treatment Recommendations Therapy as outlined in treatment plan below  Follow Up Recommendations Acute inpatient rehab (3hours/day)  Speech Therapy Frequency (ACUTE ONLY) min 2x/week  Treatment Duration 2 weeks  Individuals Consulted  Consulted and Agree with Results and Recommendations Patient  Progression Toward Goals  Progression toward goals Progressing toward goals  SLP Time Calculation  SLP Start Time (ACUTE ONLY) 1100  SLP Stop Time (ACUTE ONLY) 1110  SLP Time Calculation (min) (ACUTE ONLY) 10 min  SLP Evaluations  $ SLP Speech Visit 1 Visit  SLP Evaluations  $BSS Swallow 1 Procedure

## 2024-01-06 NOTE — Evaluation (Signed)
 Speech Language Pathology Evaluation Patient Details Name: Amanda [name] MRN: [id number] DOB: November 25, 1960 Today's Date: 01/06/2024 Time: 1025-1040 SLP Time Calculation (min) (ACUTE ONLY): 15 min  Problem List:  Patient Active Problem List   Diagnosis Date Noted   ICH (intracerebral hemorrhage) (HCC) 01/03/2024   Stroke, hemorrhagic (HCC) 01/03/2024   Malnutrition of moderate degree 01/03/2024   Severe hypertension 05/12/2023   Anemia associated with acute blood loss 05/30/2022   Symptomatic anemia 05/29/2022   Epistaxis 05/29/2022   HTN (hypertension) 05/29/2022   Chronic combined systolic and diastolic heart failure (HCC) 03/18/2022   Stage 3a chronic kidney disease (CKD) (HCC) 03/18/2022   Hypertensive emergency 09/08/2021   Acute pulmonary edema (HCC) 09/08/2021   Tobacco abuse 09/08/2021   Cocaine abuse (HCC) 09/08/2021   History of alcohol abuse 09/08/2021   Abnormal chest x-ray 09/08/2021   Past Medical History:  Past Medical History:  Diagnosis Date   Chronic cough    Chronic kidney disease    Congestive heart failure (CHF) (HCC)    Elevated troponin 09/08/2021   Hypertension    Pulmonary edema 09/08/2021   Stress incontinence    Type 2 diabetes mellitus (HCC)    Past Surgical History:  Past Surgical History:  Procedure Laterality Date   I & D EXTREMITY Right 01/21/2014   Procedure: IRRIGATION AND DEBRIDEMENT RIGHT INDEX FINGER;  Surgeon: Franky Curia, MD;  Location: [facility] OR;  Service: Orthopedics;  Laterality: Right;   HPI:  [name] with polysubstance/cocaine abuse 3 hrs before presentation, who called EMS for AMS, lethargy and near syncope. Her condition deteriorated (unable to protect her airway and more lethargic) and got intubated. Clevidipine  was started for BP control and Propofol  for sedation. On NS 0.9% infusion.   9/22.: Emergent Burr holes and right frontal ventriculostomy placed by neurosurgery due to neurological worsening   Intubated  9/22-9/24. HTN, CHF (systolic and diastolic, EF 30%, G3DD, RVSP 50.3 mmHg), DM-2, and   Assessment / Plan / Recommendation Clinical Impression  Pt demonstrates significant cognitive impairment including left inattention, poor awareness, severe impulsivity and poor funcitonal problem solving. Ability complicated by fatigue, headache and irritability. At baseline pt has a somewhat stubborn personality and is even more difficult to redirect given her inability to recognize and reason through her impairment. Briefly introduced these concepts to daughter at bedside. Will f/u in future sessions.    SLP Assessment  SLP Recommendation/Assessment: Patient needs continued Speech Language Pathology Services SLP Visit Diagnosis: Dysphagia, unspecified (R13.10)     Assistance Recommended at Discharge  Frequent or constant Supervision/Assistance  Functional Status Assessment    Frequency and Duration min 2x/week  2 weeks      SLP Evaluation Cognition  Overall Cognitive Status: Impaired/Different from baseline Arousal/Alertness: Awake/alert Orientation Level: Oriented to person;Oriented to time;Oriented to situation Attention: Focused;Sustained Focused Attention: Appears intact Sustained Attention: Impaired Sustained Attention Impairment: Verbal basic;Functional basic Memory: Impaired Memory Impairment: Storage deficit Awareness: Impaired Awareness Impairment: Intellectual impairment;Emergent impairment Problem Solving: Impaired Problem Solving Impairment: Verbal basic;Functional basic Behaviors: Impulsive;Verbal agitation Safety/Judgment: Impaired       Comprehension  Auditory Comprehension Overall Auditory Comprehension: Impaired Yes/No Questions: Not tested Commands: Impaired One Step Basic Commands: 50-74% accurate Two Step Basic Commands: 0-24% accurate    Expression Verbal Expression Overall Verbal Expression: Appears within functional limits for tasks assessed   Oral / Motor   Oral Motor/Sensory Function Overall Oral Motor/Sensory Function: Within functional limits Motor Speech Overall Motor Speech: Appears within functional limits  for tasks assessed            Jonmichael Beadnell, Consuelo Fitch 01/06/2024, 1:58 PM

## 2024-01-06 NOTE — Progress Notes (Addendum)
   NAME:  Amanda [name], MRN:  [id number], DOB:  [birth year], LOS: 3 ADMISSION DATE:  01/03/2024, CONSULTATION DATE:  01/03/2024 REFERRING MD: Carita Senior, MD, CHIEF COMPLAINT:  AMS for one hour   History of Present Illness:  [name] with HTN, CHF (systolic and diastolic, EF 30%, G3DD, RVSP 50.3 mmHg), DM-2, and polysubstance/cocaine abuse 3 hrs before presentation, who called EMS for AMS, lethargy and near syncope. Her BP in ED 180/120, repeat BP 220/126. She was oriented to person only. She denied headache, chest pain, shortness of breath, abdominal pain, nausea and vomiting. Unable to give any meaningful history. Her condition deteriorated (unable to protect her airway and more lethargic) and got intubated. Clevidipine  was started for BP control and Propofol  for sedation. On NS 0.9% infusion.   Pertinent  Medical History  HTN, CHF systolic and diastolic (Jan 2025 Echo: EF 30%, G3DD, RVSP 50.3 mmHg), DM-2, polysubstance/cocaine abuse  Significant Hospital Events: Including procedures, antibiotic start and stop dates in addition to other pertinent events   01/03/2024: ED eval 9/22: PCCM called for admission   Interim History / Subjective:  Sedated on vent.  Objective    Blood pressure (!) 148/103, pulse 79, temperature 98 F (36.7 C), temperature source Axillary, resp. rate 20, height 5' 5 (1.651 m), weight 55.5 kg, last menstrual period 12/24/2015, SpO2 94%.        Intake/Output Summary (Last 24 hours) at 01/06/2024 9277 Last data filed at 01/06/2024 0700 Gross per 24 hour  Intake 758.15 ml  Output 596 ml  Net 162.15 ml   Filed Weights   01/04/24 0500 01/05/24 0500 01/06/24 0500  Weight: 53.3 kg 56.3 kg 55.5 kg   No acute events overnight, weaned off Precedex , now on Seroquel . 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 - SBP goal now <160 - Repeat CT chest in 3 mo for RUL abnormality - Continue PTA GDMT -Tube feeds at goal   Dispo: EVD per neurosurgery  My critical care time: 35 minutes    Shakina Choy,MD CCM team

## 2024-01-06 NOTE — Progress Notes (Signed)
 Speech Language Pathology Treatment: Dysphagia  Patient Details Name: Amanda [name] MRN: [id number] DOB: 05-15-1960 Today's Date: 01/06/2024 Time: 1025-1040 SLP Time Calculation (min) (ACUTE ONLY): 15 min  Assessment / Plan / Recommendation Clinical Impression  Pt alert and asking for PO today. Vocal quality slightly hoarse, but improved after sips of water . No signs of dysphagia or aspiration with consecutive straw sips. Had to keep pt from drinking too much and pt was irritated by external pacing. Pt abel to self feed some pudding with min assist to find spoon, cup and mouth. No pocketing. Pt laid herself down after a few bits and sips and didn't want to participate anymore so solids weren't attempted. Left buccal pocketing is possible. Will start with dys 2 (minced and moist) with thin liquids and f/u for advancement if well tolerated.   HPI HPI: [name] with polysubstance/cocaine abuse 3 hrs before presentation, who called EMS for AMS, lethargy and near syncope. Her condition deteriorated (unable to protect her airway and more lethargic) and got intubated. Clevidipine  was started for BP control and Propofol  for sedation. On NS 0.9% infusion. Intubated 9/22-9/24. HTN, CHF (systolic and diastolic, EF 30%, G3DD, RVSP 50.3 mmHg), DM-2, and      SLP Plan  Continue with current plan of care          Recommendations  Diet recommendations: Dysphagia 2 (fine chop);Thin liquid Liquids provided via: Straw Medication Administration: Whole meds with liquid Supervision: Full supervision/cueing for compensatory strategies Compensations: Slow rate;Small sips/bites Postural Changes and/or Swallow Maneuvers: Seated upright 90 degrees                      Frequent or constant Supervision/Assistance       Continue with current plan of care     Anai Lipson, Consuelo Fitch  01/06/2024, 10:59 AM

## 2024-01-06 NOTE — Progress Notes (Signed)
  Inpatient Rehabilitation Admissions Coordinator   Met with patient at bedside and spoke with her daughter, [name], by phone for rehab assessment. Prior to admit patient lived with Rutha, her significant other of 18 years. Worked temp jobs. Rutha also works. We discussed goals and expectations of a possible CIR admit. I explained to [facility], that patient would need caregiver supports after a CIR admit when Rutha works. Her insurance would not provide that care, it would be family and friends. I encouraged [name] to talk to Rutha and her 2 siblings to begin those caregiver conversations. Unless that support could be arranged after a CIR admit, we would not pursue Cir, would instead recommend SNF. Please call me with any questions.   Heron Leavell, RN, MSN Rehab Admissions Coordinator [phone]

## 2024-01-06 NOTE — PMR Pre-admission (Shared)
 PMR Admission Coordinator Pre-Admission Assessment  Patient: Amanda [name] is an 63 y.o., female MRN: [id number] DOB: 1961-03-14 Height: 5' 5 (165.1 cm) Weight: 55.5 kg  Insurance Information HMO:     PPO:      PCP:      IPA:      80/20:      OTHER:  PRIMARY: [place] [employer] Healthy [employer]      Policy#: [id number]      Subscriber: pt CM Name: ***      Phone#: ***     Fax#: *** Pre-Cert#: ***      Employer: *** Benefits:  Phone #: ***     Name: *** Eff. Date: ***     Deduct: ***      Out of Pocket Max: ***      Life Max: *** CIR: ***      SNF: *** Outpatient: ***     Co-Pay: *** Home Health: ***      Co-Pay: *** DME: ***     Co-Pay: *** Providers: in network  SECONDARY: none      Policy#:      Phone#:   Artist:       Phone#:   The Data processing manager" for patients in Inpatient Rehabilitation Facilities with attached "Privacy Act Statement-Health Care Records" was provided and verbally reviewed with: Family  Emergency Contact Information Contact Information     Name Relation Home Work Mobile   [place] Daughter   [phone]   Vannie Jakob Significant other   [phone]      Other Contacts   None on File    Current Medical History  Patient Admitting Diagnosis: ICH  History of Present Illness: 63 yo female with history of HTN, CHF, EF 30%, type 2 DM and polysubstance abuse 3 hrs before presentation. She called herself EMS for AMS, lethargy and near syncope. Presented on 9/22. BP upon arrival 180/120. Condition deteriorated and intubated.   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 Emergent burr home and right frontal ventric placed by neurosurgeon on 9/22. Eventually extubated on 9/23.   Placed on precedex  due to restless and agitated. UDS positive for cocaine. 2 d echo 40 %. No antithrombotic.   Home meds for HTN CHF, and pulmonary edema of carvedilol , lasix , Entresto  and spironolactone . On cleviprex  gtt  initially. To restart antihypertensives as needed.   Type 2 DM on home meds of Jardiance . Hgb A1c 6.6. CBGS and SSI. Close follow up with PCP as OP. Current smoker.    *** medical updates form 9.25 ****  Complete NIHSS TOTAL: 7  Patient's medical record from [facility] has been reviewed by the rehabilitation admission coordinator and physician.  Past Medical History  Past Medical History:  Diagnosis Date   Chronic cough    Chronic kidney disease    Congestive heart failure (CHF) (HCC)    Elevated troponin 09/08/2021   Hypertension    Pulmonary edema 09/08/2021   Stress incontinence    Type 2 diabetes mellitus (HCC)    Has the patient had major surgery during 100 days prior to admission? Yes  Family History   family history includes Heart disease in her mother.  Current Medications  Current Facility-Administered Medications:    acetaminophen  (TYLENOL ) tablet 650 mg, 650 mg, Oral, Q4H PRN **OR** acetaminophen  (TYLENOL ) 160 MG/5ML solution 650 mg, 650 mg, Per Tube, Q4H PRN, 650 mg at 01/06/24 0633 **OR** acetaminophen  (TYLENOL ) suppository 650 mg, 650 mg, Rectal, Q4H PRN,  Lindzen, Eric, MD, 650 mg at 01/05/24 9641   carvedilol  (COREG ) tablet 25 mg, 25 mg, Per Tube, BID WC, Sharie Bourbon, MD   Chlorhexidine  Gluconate Cloth 2 % PADS 6 each, 6 each, Topical, Daily, Albustami, Omar M, MD, 6 each at 01/06/24 1531   dexmedetomidine  (PRECEDEX ) 400 MCG/100ML (4 mcg/mL) infusion, 0-1.2 mcg/kg/hr, Intravenous, Titrated, Claudene Toribio BROCKS, MD, Stopped at 01/06/24 1004   docusate (COLACE) 50 MG/5ML liquid 100 mg, 100 mg, Per Tube, BID PRN, Albustami, Omar M, MD   docusate (COLACE) 50 MG/5ML liquid 100 mg, 100 mg, Per Tube, BID, Claudene Toribio BROCKS, MD, 100 mg at 01/06/24 9095   feeding supplement (OSMOLITE 1.5 CAL) liquid 1,000 mL, 1,000 mL, Per Tube, Continuous, Claudene Toribio BROCKS, MD, Last Rate: 40 mL/hr at 01/06/24 1300, Infusion Verify at 01/06/24 1300   feeding supplement (PROSource  TF20) liquid 60 mL, 60 mL, Per Tube, Daily, Claudene Toribio BROCKS, MD, 60 mL at 01/06/24 0904   fentaNYL  (SUBLIMAZE ) injection 50 mcg, 50 mcg, Intravenous, Q15 min PRN, Rancour, Garnette, MD, 50 mcg at 01/03/24 0411   heparin  injection 5,000 Units, 5,000 Units, Subcutaneous, Q8H, Sethi, Pramod S, MD, 5,000 Units at 01/06/24 1432   hydrALAZINE  (APRESOLINE ) injection 10-20 mg, 10-20 mg, Intravenous, Q4H PRN, Remi Pippin, NP, 10 mg at 01/06/24 1432   labetalol  (NORMODYNE ) injection 10-20 mg, 10-20 mg, Intravenous, Q2H PRN, Remi Pippin, NP, 20 mg at 01/06/24 1531   Oral care mouth rinse, 15 mL, Mouth Rinse, 4 times per day, Sharie Bourbon, MD, 15 mL at 01/06/24 1531   Oral care mouth rinse, 15 mL, Mouth Rinse, PRN, Sharie Bourbon, MD   pantoprazole  (PROTONIX ) injection 40 mg, 40 mg, Intravenous, QHS, Lindzen, Eric, MD, 40 mg at 01/05/24 2228   polyethylene glycol (MIRALAX  / GLYCOLAX ) packet 17 g, 17 g, Per Tube, Daily PRN, Albustami, Omar M, MD   polyethylene glycol (MIRALAX  / GLYCOLAX ) packet 17 g, 17 g, Per Tube, BID, Hussein, Bourbon, MD   QUEtiapine  (SEROQUEL ) tablet 50 mg, 50 mg, Per Tube, QHS, Hussein, Bourbon, MD   sacubitril -valsartan  (ENTRESTO ) 97-103 mg per tablet, 1 tablet, Per Tube, BID, Sethi, Pramod S, MD, 1 tablet at 01/06/24 9082   sodium chloride  flush (NS) 0.9 % injection 10-40 mL, 10-40 mL, Intracatheter, Q12H, Lindzen, Eric, MD, 10 mL at 01/05/24 2251   sodium chloride  flush (NS) 0.9 % injection 10-40 mL, 10-40 mL, Intracatheter, PRN, Lindzen, Eric, MD   spironolactone  (ALDACTONE ) tablet 25 mg, 25 mg, Per Tube, Daily, Sethi, Pramod S, MD, 25 mg at 01/06/24 9094   thiamine  (VITAMIN B1) tablet 100 mg, 100 mg, Per Tube, Daily, Claudene Toribio BROCKS, MD, 100 mg at 01/06/24 9094  Patients Current Diet:  Diet Order             DIET DYS 2 Room service appropriate? No; Fluid consistency: Thin  Diet effective now                   Precautions /  Restrictions Precautions Precautions: Fall, Other (comment) Precaution/Restrictions Comments: EVD (clamp prior to mobility), SBP < 160, R wrist restraint, bilateral mitts, posey, a line Restrictions Weight Bearing Restrictions Per Provider Order: No   Has the patient had 2 or more falls or a fall with injury in the past year? No  Prior Activity Level Community (5-7x/wk): independent, worked temp jobs  Prior Functional Level Self Care: Did the patient need help bathing, dressing, using the toilet or eating? Independent  Indoor Mobility: Did the patient  need assistance with walking from room to room (with or without device)? Independent  Stairs: Did the patient need assistance with internal or external stairs (with or without device)? Independent  Functional Cognition: Did the patient need help planning regular tasks such as shopping or remembering to take medications? Independent  Patient Information Are you of Hispanic, Latino/a,or Spanish origin?: X. Patient unable to respond (dtr states no) What is your race?: X. Patient unable to respond (dtr states black) Do you need or want an interpreter to communicate with a doctor or health care staff?: 9. Unable to respond (dtr states no) Patient information obtained via proxy : obtained per dtr  Patient's Response To:  Health Literacy and Transportation Is the patient able to respond to health literacy and transportation needs?: No Health Literacy - How often do you need to have someone help you when you read instructions, pamphlets, or other written material from your doctor or pharmacy?: Patient unable to respond In the past 12 months, has lack of transportation kept you from medical appointments or from getting medications?:  (per dtr, no) In the past 12 months, has lack of transportation kept you from meetings, work, or from getting things needed for daily living?:  (per dtr, no) Health Literacy and Transportation obtained via proxy: pt  unable to respond, per dtr  Home Assistive Devices / Equipment    Prior Device Use: Indicate devices/aids used by the patient prior to current illness, exacerbation or injury? None of the above  Current Functional Level Cognition  Arousal/Alertness: Awake/alert Overall Cognitive Status: Impaired/Different from baseline Orientation Level: Oriented to person, Oriented to time, Oriented to situation Attention: Focused, Sustained Focused Attention: Appears intact Sustained Attention: Impaired Sustained Attention Impairment: Verbal basic, Functional basic Memory: Impaired Memory Impairment: Storage deficit Awareness: Impaired Awareness Impairment: Intellectual impairment, Emergent impairment Problem Solving: Impaired Problem Solving Impairment: Verbal basic, Functional basic Behaviors: Impulsive, Verbal agitation Safety/Judgment: Impaired    Extremity Assessment (includes Sensation/Coordination)  Upper Extremity Assessment: Generalized weakness, RUE deficits/detail, LUE deficits/detail RUE Deficits / Details: painful to touch, moves spontaneously against gravity LUE Deficits / Details: 3/5, difficult assessment 2/2 cognition  Lower Extremity Assessment: Defer to PT evaluation RLE Deficits / Details: Moves spontaneously, at least 3/5 strength LLE Deficits / Details: Weaker than RLE, difficult to assess due to cognition. Able to wiggle toes    ADLs  Overall ADL's : Needs assistance/impaired Eating/Feeding: NPO Grooming: Moderate assistance Upper Body Bathing: Moderate assistance Lower Body Bathing: Maximal assistance Upper Body Dressing : Moderate assistance Lower Body Dressing: Maximal assistance Toilet Transfer: Minimal assistance, +2 for physical assistance Toileting- Clothing Manipulation and Hygiene: Moderate assistance Functional mobility during ADLs: Minimal assistance, +2 for physical assistance    Mobility  Overal bed mobility: Needs Assistance Bed Mobility: Rolling,  Sidelying to Sit Rolling: Mod assist, +2 for safety/equipment Sidelying to sit: Mod assist, +2 for safety/equipment Supine to sit: Min assist, +2 for safety/equipment Sit to supine: Max assist, +2 for physical assistance General bed mobility comments: difficulty understanding to stay on her rt side for peri-care by RN and constantly rolling back into stool    Transfers  Overall transfer level: Needs assistance Equipment used: 2 person hand held assist Transfers: Sit to/from Stand, Bed to chair/wheelchair/BSC Sit to Stand: Min assist, +2 physical assistance, +2 safety/equipment Bed to/from chair/wheelchair/BSC transfer type:: Step pivot Step pivot transfers: Mod assist, +2 physical assistance, +2 safety/equipment General transfer comment: MinA + 2 to rise to stand, no L knee buckle, kept pivoting toward RN  to clean her bottom (and away from the chair we were instructing her to turn and sit in) required incr assist to pivot the correct direction    Ambulation / Gait / Stairs / Wheelchair Mobility  Ambulation/Gait General Gait Details: deferred this session due to elevated BP after transfer    Posture / Balance Dynamic Sitting Balance Sitting balance - Comments: minA, anterior lean Balance Overall balance assessment: Needs assistance Sitting-balance support: Feet supported Sitting balance-Leahy Scale: Poor Sitting balance - Comments: minA, anterior lean Standing balance support: Bilateral upper extremity supported Standing balance-Leahy Scale: Poor Standing balance comment: reliant on external support from therapist    Special considerations/life events  Hgb A1c 6.6 Uses crack   Previous Home Environment  Living Arrangements:  Kirt, boyfriend of 18 years)  Lives With: Significant other Available Help at Discharge: Family, Available 24 hours/day (dtr, [name], to arrange 24/7) Home Care Services: No Additional Comments: pt not verbalizing much during session, states she is ind  and lives alone in a house  Discharge Living Setting Does the patient have any problems obtaining your medications?: No  Social/Family/Support Systems 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, IT sales professional Information: daughter, Tanya and bf, Rutha Anticipated Caregiver: Rutha and family Anticipated Caregiver's Contact Information: see contacts Ability/Limitations of Caregiver: Rutha works Medical laboratory scientific officer: 24/7 Discharge Plan Discussed with Primary Caregiver: Yes Is Caregiver In Agreement with Plan?: Yes Does Caregiver/Family have Issues with Lodging/Transportation while Pt is in Rehab?: No  Goals Patient/Family Goal for Rehab: supervision PT, OT and SLP Expected length of stay: ELOS 14 to 20 days Pt/Family Agrees to Admission and willing to participate:  (unable) Program Orientation Provided & Reviewed with Pt/Caregiver Including Roles  & Responsibilities: Yes  Decrease burden of Care through IP rehab admission: n/a  Possible need for SNF placement upon discharge: not anticipated  Patient Condition: I have reviewed medical records from [facility], spoken with  patient and daughter. I met with patient at the bedside and discussed via phone for inpatient rehabilitation assessment.  Patient will benefit from ongoing PT, OT, and SLP, can actively participate in 3 hours of therapy a day 5 days of the week, and can make measurable gains during the admission.  Patient will also benefit from the coordinated team approach during an Inpatient Acute Rehabilitation admission.  The patient will receive intensive therapy as well as Rehabilitation physician, nursing, social worker, and care management interventions.  Due to bladder management, bowel management, safety, skin/wound care, disease management, medication administration, pain management, and patient education the patient requires 24 hour a day rehabilitation nursing.  The patient is currently *** with mobility and basic ADLs.  Discharge  setting and therapy post discharge at home with home health is anticipated.  Patient has agreed to participate in the Acute Inpatient Rehabilitation Program and will admit {Time; today/tomorrow:10263}.  Preadmission Screen Completed By:  Alison Heron Lot, RN MSN 01/06/2024 4:23 PM ______________________________________________________________________   Discussed status with Dr. PIERRETTE on *** at *** and received approval for admission today.  Admission Coordinator:  Alison Heron Lot, RN MSN time PIERRETTEPattricia ***   Assessment/Plan: Diagnosis: *** Does the need for close, 24 hr/day Medical supervision in concert with the patient's rehab needs make it unreasonable for this patient to be served in a less intensive setting? {yes_no_potentially:3041433} Co-Morbidities requiring supervision/potential complications: *** Due to {due un:6958565}, does the patient require 24 hr/day rehab nursing? {yes_no_potentially:3041433} Does the patient require coordinated care of a physician, rehab nurse, PT, OT, and SLP to address physical and  functional deficits in the context of the above medical diagnosis(es)? {yes_no_potentially:3041433} Addressing deficits in the following areas: {deficits:3041436} Can the patient actively participate in an intensive therapy program of at least 3 hrs of therapy 5 days a week? {yes_no_potentially:3041433} The potential for patient to make measurable gains while on inpatient rehab is {potential:3041437} Anticipated functional outcomes upon discharge from inpatient rehab: {functional outcomes:304600100} PT, {functional outcomes:304600100} OT, {functional outcomes:304600100} SLP Estimated rehab length of stay to reach the above functional goals is: *** Anticipated discharge destination: {anticipated dc setting:21604} 10. Overall Rehab/Functional Prognosis: {potential:3041437}   MD Signature: ***

## 2024-01-06 NOTE — Progress Notes (Signed)
 Physical Therapy Treatment Patient Details Name: Amanda [name] MRN: [id number] DOB: February 28, 1961 Today's Date: 01/06/2024   History of Present Illness 63 yo female presents [facility]/22 with lethargy and near-syncope s/p cocaine and opioid ingestion. 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 S/p R frontal ventriculostomy, burr holes 9/22. ETT 9/22-9/23. PMHx of cocaine abuse, CHF, CKD, chronic cough, HTN and DM2.    PT Comments  Patient very distracted by the fact she had pooped in the bed and wanted to be cleaned up. RN in to assist. Patient very restless and not following simple commands more than 50% of the time (too internally distracted and asking to be cleaned up even as RN was cleaning her). RN requested help pt stand and she could finish cleaning pt without her repeatedly turning back into the BM all over her bed. Pt stood ~2 minutes for pericare and then resisted turning to sit in chair. Once assisted to pivot, but very content to be up in chair. RN assisted with restraints and lap belt style alarm.     If plan is discharge home, recommend the following: A lot of help with walking and/or transfers;A little help with bathing/dressing/bathroom;Direct supervision/assist for medications management;Direct supervision/assist for financial management;Assist for transportation;Help with stairs or ramp for entrance;Supervision due to cognitive status   Can travel by private vehicle        Equipment Recommendations  Other (comment) (TBA)    Recommendations for Other Services       Precautions / Restrictions Precautions Precautions: Fall;Other (comment) Recall of Precautions/Restrictions: Impaired Precaution/Restrictions Comments: EVD (clamp prior to mobility), SBP < 160, R wrist restraint, bilateral mitts, posey, a line Restrictions Weight Bearing Restrictions Per Provider Order: No      Mobility  Bed Mobility Overal bed mobility: Needs Assistance Bed Mobility: Rolling, Sidelying to Sit Rolling: Mod assist, +2 for safety/equipment Sidelying to sit: Mod assist, +2 for safety/equipment       General bed mobility comments: difficulty understanding to stay on her rt side for peri-care by RN and constantly rolling back into stool    Transfers Overall transfer level: Needs assistance Equipment used: 2 person hand held assist Transfers: Sit to/from Stand, Bed to chair/wheelchair/BSC Sit to Stand: Min assist, +2 physical assistance, +2 safety/equipment   Step pivot transfers: Mod assist, +2 physical assistance, +2 safety/equipment       General transfer comment: MinA + 2 to rise to stand, no L knee buckle, kept pivoting toward RN to clean her bottom (and away from the chair we were instructing her to turn and sit in) required incr assist to pivot the correct direction    Ambulation/Gait               General Gait Details: deferred this session due to elevated BP after transfer   Stairs             Wheelchair Mobility     Tilt Bed    Modified Rankin (Stroke Patients Only) Modified Rankin (Stroke Patients Only) Pre-Morbid Rankin Score: No symptoms Modified Rankin: Moderately severe disability     Balance Overall balance assessment: Needs assistance Sitting-balance support: Feet supported Sitting balance-Leahy Scale: Poor Sitting balance - Comments: minA, anterior lean   Standing balance support: Bilateral upper extremity supported Standing balance-Leahy Scale: Poor Standing balance comment: reliant on external support from therapist  Communication Communication Communication: Impaired Factors Affecting Communication: Reduced clarity of speech (hoarse voice quality)  Cognition Arousal: Alert Behavior During Therapy: Restless, Impulsive   PT - Cognitive impairments: Orientation, Awareness,  Attention, Safety/Judgement   Orientation impairments: Place, Time, Situation                   PT - Cognition Comments: fixated on the fact she's had a BM and needs to be cleaned up (accurate) and difficult to direct her in how to move to assist with clean-up Following commands: Impaired Following commands impaired: Follows one step commands with increased time, Follows one step commands inconsistently    Cueing Cueing Techniques: Verbal cues, Gestural cues, Tactile cues, Visual cues  Exercises      General Comments General comments (skin integrity, edema, etc.): BP initially <160, but with pt restless and at times working against PT/RN by the time she settled in chair BP 183/113. RN present and aware      Pertinent Vitals/Pain Pain Assessment Pain Assessment: No/denies pain Faces Pain Scale: Hurts a little bit    Home Living                          Prior Function            PT Goals (current goals can now be found in the care plan section) Acute Rehab PT Goals Patient Stated Goal: unable Time For Goal Achievement: 01/19/24 Potential to Achieve Goals: Fair Progress towards PT goals: Progressing toward goals    Frequency    Min 3X/week      PT Plan      Co-evaluation              AM-PAC PT 6 Clicks Mobility   Outcome Measure  Help needed turning from your back to your side while in a flat bed without using bedrails?: A Little Help needed moving from lying on your back to sitting on the side of a flat bed without using bedrails?: A Little Help needed moving to and from a bed to a chair (including a wheelchair)?: A Lot Help needed standing up from a chair using your arms (e.g., wheelchair or bedside chair)?: A Lot Help needed to walk in hospital room?: Total Help needed climbing 3-5 steps with a railing? : Total 6 Click Score: 12    End of Session Equipment Utilized During Treatment: Gait belt Activity Tolerance: Treatment limited  secondary to medical complications (Comment) (perseveration on pericare; elevated BP) Patient left: with call bell/phone within reach;with restraints reapplied;in chair;with chair alarm set;with nursing/sitter in room (waist restraint, bil mitts, bil wrist) Nurse Communication: Mobility status PT Visit Diagnosis: Unsteadiness on feet (R26.81);Difficulty in walking, not elsewhere classified (R26.2)     Time: 8673-8648 PT Time Calculation (min) (ACUTE ONLY): 25 min  Charges:    $Therapeutic Activity: 23-37 mins PT General Charges $$ ACUTE PT VISIT: 1 Visit                      Macario RAMAN, PT Acute Rehabilitation Services  Office [phone]    Macario SHAUNNA Soja 01/06/2024, 2:17 PM

## 2024-01-06 NOTE — Progress Notes (Signed)
    Providing Compassionate, Quality Care - Together   NEUROSURGERY PROGRESS NOTE     S: No issues overnight.    O: EXAM:  BP 110/78   Pulse 74   Temp 98.2 F (36.8 C) (Axillary)   Resp (!) 25   Ht 5' 5 (1.651 m)   Wt 55.5 kg   LMP 12/24/2015 (Within Days)   SpO2 90%   BMI 20.36 kg/m     Awake, alert Answers simple questions Speech fluent CNs grossly intact  Fcx4 EVD draining blood tinged CSF   ASSESSMENT:  63 y.o. with IPH and IVH, s/p EVD    PLAN: -Continue EVD at 10cm H20 -Supportive care -Call w/ questions/concerns.   Camie Pickle, [facility]

## 2024-01-06 NOTE — TOC Initial Note (Signed)
 Transition of Care [facility]) - Initial/Assessment Note    Patient Details  Name: Amanda [name] MRN: [id number] Date of Birth: 31-Oct-1960  Transition of Care [facility]) CM/SW Contact:    Inocente GORMAN Kindle, LCSW Phone Number: 01/06/2024, 8:36 AM  Clinical Narrative:                 Patient currently with [facility]. CSW continuing to follow for rehab venue needs.   Expected Discharge Plan: IP Rehab Facility Barriers to Discharge: Continued Medical Work up, English as a second language teacher   Patient Goals and CMS Choice Patient states their goals for this hospitalization and ongoing recovery are:: Rehab          Expected Discharge Plan and Services       Living arrangements for the past 2 months: Single Family Home                                      Prior Living Arrangements/Services Living arrangements for the past 2 months: Single Family Home   Patient language and need for interpreter reviewed:: Yes Do you feel safe going back to the place where you live?: Yes      Need for Family Participation in Patient Care: Yes (Comment) Care giver support system in place?: Yes (comment)   Criminal Activity/Legal Involvement Pertinent to Current Situation/Hospitalization: No - Comment as needed  Activities of Daily Living   ADL Screening (condition at time of admission) Independently performs ADLs?: No Does the patient have a NEW difficulty with bathing/dressing/toileting/self-feeding that is expected to last >3 days?: No Does the patient have a NEW difficulty with getting in/out of bed, walking, or climbing stairs that is expected to last >3 days?: No Does the patient have a NEW difficulty with communication that is expected to last >3 days?: No Is the patient deaf or have difficulty hearing?: No Does the patient have difficulty seeing, even when wearing glasses/contacts?: No Does the patient have difficulty concentrating, remembering, or making decisions?: Yes  Permission  Sought/Granted Permission sought to share information with : Facility Medical sales representative, Family Supports                Emotional Assessment Appearance:: Appears stated age Attitude/Demeanor/Rapport: Unable to Assess Affect (typically observed): Unable to Assess Orientation: : Oriented to Self Alcohol / Substance Use: Not Applicable Psych Involvement: No (comment)  Admission diagnosis:  Hemorrhagic stroke (HCC) [I61.9] ICH (intracerebral hemorrhage) (HCC) [I61.9] Hypertensive emergency [I16.1] Stroke, hemorrhagic [facility]) [I61.9] Patient Active Problem List   Diagnosis Date Noted   ICH (intracerebral hemorrhage) (HCC) 01/03/2024   Stroke, hemorrhagic (HCC) 01/03/2024   Malnutrition of moderate degree 01/03/2024   Severe hypertension 05/12/2023   Anemia associated with acute blood loss 05/30/2022   Symptomatic anemia 05/29/2022   Epistaxis 05/29/2022   HTN (hypertension) 05/29/2022   Chronic combined systolic and diastolic heart failure (HCC) 03/18/2022   Stage 3a chronic kidney disease (CKD) (HCC) 03/18/2022   Hypertensive emergency 09/08/2021   Acute pulmonary edema (HCC) 09/08/2021   Tobacco abuse 09/08/2021   Cocaine abuse (HCC) 09/08/2021   History of alcohol abuse 09/08/2021   Abnormal chest x-ray 09/08/2021   PCP:  Patient, No Pcp Per Pharmacy:   [facility] [phone] - [place], [place] - [address] AT [facility] [address] [place] [address] Phone: [phone] Fax: [phone]     Social Drivers of  Health (SDOH) Social History: SDOH Screenings   Food Insecurity: No Food Insecurity (07/16/2023)   Received from [facility]  Housing: Low Risk  (07/16/2023)   Received from [facility]  Transportation Needs: No Transportation Needs (07/16/2023)   Received from [facility]  Utilities: Not At Risk (07/16/2023)   Received from [facility]  Alcohol Screen: Low Risk  (05/14/2023)  Financial Resource Strain:  Low Risk  (07/16/2023)   Received from [facility]  Social Connections: Unknown (10/19/2022)   Received from [facility]  Tobacco Use: High Risk (01/03/2024)   SDOH Interventions:     Readmission Risk Interventions    03/26/2022   10:50 AM  Readmission Risk Prevention Plan  Transportation Screening Complete  HRI or Home Care Consult Complete  Social Work Consult for Recovery Care Planning/Counseling Complete  Palliative Care Screening Not Applicable  Medication Review Oceanographer) Referral to Pharmacy

## 2024-01-06 NOTE — Progress Notes (Addendum)
 STROKE TEAM PROGRESS NOTE    SIGNIFICANT HOSPITAL EVENTS 9/21: Brought in by EMS due to confusion and lethargy.  Patient called EMS themselves.  Admitted to using cocaine approximately 3 hours before. 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  9/22.: Emergent Burr holes and right frontal ventriculostomy placed by neurosurgery due to neurological worsening. 9/23: Extubated  INTERIM HISTORY/SUBJECTIVE Daughter at the bedside. EVD in place. Currently on precedex , may need to consider seroquel  for longer term management  Patient is more alert and interactive.  She is dysarthric but can speak few short sentences and follows some simple commands.  She is moving all 4 extremities well purposefully.  Blood pressure adequately controlled.  Ventriculostomy still draining well OBJECTIVE CBC    Component Value Date/Time   WBC 9.1 01/06/2024 0617   RBC 5.32 (H) 01/06/2024 0617   HGB 15.2 (H) 01/06/2024 0617   HCT 46.0 01/06/2024 0617   PLT 319 01/06/2024 0617   MCV 86.5 01/06/2024 0617   MCH 28.6 01/06/2024 0617   MCHC 33.0 01/06/2024 0617   RDW 13.4 01/06/2024 0617   LYMPHSABS 1.9 01/03/2024 0145   MONOABS 0.6 01/03/2024 0145   EOSABS 0.1 01/03/2024 0145   BASOSABS 0.0 01/03/2024 0145   BMET    Component Value Date/Time   NA 135 01/06/2024 0617   K 4.5 01/06/2024 0617   CL 105 01/06/2024 0617   CO2 20 (L) 01/06/2024 0617   GLUCOSE 147 (H) 01/06/2024 0617   BUN 20 01/06/2024 0617   CREATININE 0.92 01/06/2024 0617   CREATININE 0.90 11/01/2013 1024   CALCIUM 8.7 (L) 01/06/2024 0617   GFRNONAA >60 01/06/2024 0617   GFRNONAA 74 11/01/2013 1024   Lab Results  Component Value Date   HGBA1C 6.6 (H) 01/03/2024   Lab Results  Component Value Date   CHOL 149 01/03/2024   HDL 60 01/03/2024   LDLCALC 77 01/03/2024   TRIG 167 (H) 01/04/2024   CHOLHDL 2.5 01/03/2024   Drugs of Abuse     Component Value Date/Time   LABOPIA NONE DETECTED 01/03/2024  0459   COCAINSCRNUR POSITIVE (A) 01/03/2024 0459   LABBENZ NONE DETECTED 01/03/2024 0459   AMPHETMU NONE DETECTED 01/03/2024 0459   THCU NONE DETECTED 01/03/2024 0459   LABBARB NONE DETECTED 01/03/2024 0459    Urinalysis    Component Value Date/Time   COLORURINE YELLOW 01/03/2024 0459   APPEARANCEUR CLEAR 01/03/2024 0459   LABSPEC >1.046 (H) 01/03/2024 0459   PHURINE 6.0 01/03/2024 0459   GLUCOSEU 50 (A) 01/03/2024 0459   HGBUR NEGATIVE 01/03/2024 0459   BILIRUBINUR NEGATIVE 01/03/2024 0459   BILIRUBINUR Small 10/11/2013 1200   KETONESUR NEGATIVE 01/03/2024 0459   PROTEINUR 30 (A) 01/03/2024 0459   UROBILINOGEN 0.2 10/11/2013 1200   UROBILINOGEN 0.2 02/28/2008 1612   NITRITE NEGATIVE 01/03/2024 0459   LEUKOCYTESUR NEGATIVE 01/03/2024 0459   IMAGING past 24 hours No results found.  Vitals:   01/06/24 0500 01/06/24 0600 01/06/24 0700 01/06/24 0800  BP: (!) 155/105 (!) 143/106 (!) 148/103 (!) 165/118  Pulse: 75 78 79 76  Resp: (!) 24 (!) 33 20 (!) 25  Temp:    98.2 F (36.8 C)  TempSrc:    Axillary  SpO2: 92% 92% 94% 97%  Weight: 55.5 kg     Height:       PHYSICAL EXAM General: Critically ill-appearing patient laying in ICU bed.  EVD in place. Psych: Unable to assess due to patient's condition CV: Regular rate and  rhythm on monitor Respiratory: On 5L [place]  GI: Abdomen soft and nontender  NEURO:  Mental Status: Agitated, no verbal output, not following commands  Speech/Language:  agitated but no verbal output.  Cranial Nerves:  II: Does not blink to threat consistently  III, IV, VI: Does not fixate or track. VII:  VIII: Able to assess, patient does not respond to voice. IX, X: Gag are intact. XI: Head is grossly midline. XII: Does not protrude tongue to command. Motor/Sensory:  Moves all extremities but left upper extremity and left lower extremity weaker than right  Resists passive ROM Coordination: Does not perform Gait: Deferred for patient safety  Most  Recent NIH 18   ASSESSMENT/PLAN Amanda [name] is a 63 y.o. female with history of cocaine abuse, CHF, CKD, HTN and DM2 BIB EMS with confusion and lethargy. BP was 209/142 on arrival. CT head revealed an acute right gangliothalamic intraparenchymal hemorrhage with intraventricular extension and associated early hydrocephalus,  admitted for ICU monitoring and critical care.  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 No herniation. Chronic ischemic white matter changes.   CTA head & neck  No emergent large vessel occlusion. Poor opacification of the v1 and v4 segments of the left vertebral artery. Mild atherosclerotic calcification of both carotid bifurcations without hemodynamically significant stenosis.   Repeat CTH:  Medial Right thalamic hematoma, estimated 13 mL and slightly larger since 0220 hours today. Intraventricular blood volume not significantly changed. Stable ventricle size, and as before difficult to exclude a degree of acute lateral ventriculomegaly. MRI: When comparing across modalities, similar size of an intraparenchymal hemorrhage centered in the right thalamus and smaller intraparenchymal hemorrhage in the left basal ganglia. Mildly increased volume of intraventricular hemorrhage layering in the occipital horns. Right frontal approach ventriculostomy catheter with some hemorrhage and edema along the catheter tract. Now mild hydrocephalus appears slightly improved in comparison to recent CT head. No convincing transependymal flow of CSF. CT head- Right frontal approach ventriculostomy catheter in place, abutting the septum pellucidum, with new hemorrhage along the  posterior margin of the tract, including within the caudate. Decreased size of intrathalamic hemorrhage. UDS: Positive for Cocaine 2D Echo: EF 40% LDL 77 HgbA1c 6.6 VTE prophylaxis - SCDs No antithrombotic prior to admission, now on No antithrombotic due to ICH Therapy recommendations:  CIR Disposition:  PENDING  Hydrocephalus S/p ventriculostomy Emergent evd placed 9/22 am by Neurosurgery Repeat imaging pending- MRI AM  Acute respiratory failure secondary to ICH CCM management, appreciate assistance Extubated 9/23 Precedex  for agitation   Hypertension Hx of CHF Hx of Pulmonary Edema Home meds: Carvedilol  3.125 mg twice daily, Lasix  20 mg as needed, Entresto  twice daily, spironolactone  25 mg daily Unstable Continue Cleviprex  gtt, wean as tolerated Restart home antihypertensives: Entresto , spironolactone , Coreg  As needed hydralazine  and labetalol  added for better blood pressure control Blood pressure goal less than 160  Hyperlipidemia Home meds:  none LDL ordered, goal < 70 Consider statin at discharge as needed  Diabetes type II,  Home meds:  Jardiance , no insulin  use HgbA1c 6.6, goal < 7.0 CBGs, SSI Recommend close follow-up with PCP   Tobacco Abuse Patient is a current smoker      Ready to quit? N/A Nicotine  replacement therapy provided  Substance Abuse Patient uses  crack cocaine UDS positive for Cocaine      Ready to quit? N/A TOC consult for cessation placed  Dysphagia Patient has post-stroke dysphagia, SLP consulted    Diet   Diet NPO time specified   Advance diet as tolerated [facility] in place    Hospital day # 3  Patient seen and examined by NP/APP with MD. MD to update note as needed.   Jorene Last, DNP, FNP-BC [facility] Pager: [phone]  I have personally obtained history,examined this patient, reviewed notes, independently viewed imaging studies, participated in medical decision making and plan of care.ROS completed by  me personally and pertinent positives fully documented  I have made any additions or clarifications directly to the above note. Agree with note above.  Continue strict blood pressure control systolic goal below 160.  Wean off Precedex  drip for sedation and try/ Haldol /Seroquel  instead.  Discussed with Dr. Mellie critical care medicine.  Continue ventriculostomy drainage as per neurosurgery.  Long discussion with patient and daughter at the bedside and answered questions. This patient is critically ill and at significant risk of neurological worsening, death and care requires constant monitoring of vital signs, hemodynamics,respiratory and cardiac monitoring, extensive review of multiple databases, frequent neurological assessment, discussion with family, other specialists and medical decision making of high complexity.I have made any additions or clarifications directly to the above note.This critical care time does not reflect procedure time, or teaching time or supervisory time of PA/NP/Med Resident etc but could involve care discussion time.  I spent 30 minutes of neurocritical care time  in the care of  this patient.     Eather Popp, MD Medical Director [facility] Pager: [phone] 01/06/2024 3:57 PM    To contact Stroke Continuity provider, please refer to [email or web address]. After hours, contact General Neurology

## 2024-01-07 ENCOUNTER — Inpatient Hospital Stay (HOSPITAL_COMMUNITY)

## 2024-01-07 DIAGNOSIS — F1410 Cocaine abuse, uncomplicated: Secondary | ICD-10-CM | POA: Diagnosis not present

## 2024-01-07 DIAGNOSIS — E440 Moderate protein-calorie malnutrition: Secondary | ICD-10-CM

## 2024-01-07 DIAGNOSIS — I610 Nontraumatic intracerebral hemorrhage in hemisphere, subcortical: Secondary | ICD-10-CM | POA: Diagnosis not present

## 2024-01-07 DIAGNOSIS — R29718 NIHSS score 18: Secondary | ICD-10-CM | POA: Diagnosis not present

## 2024-01-07 DIAGNOSIS — R4182 Altered mental status, unspecified: Secondary | ICD-10-CM

## 2024-01-07 DIAGNOSIS — I615 Nontraumatic intracerebral hemorrhage, intraventricular: Secondary | ICD-10-CM | POA: Diagnosis not present

## 2024-01-07 LAB — CBC
HCT: 47.2 % — ABNORMAL HIGH (ref 36.0–46.0)
Hemoglobin: 15.8 g/dL — ABNORMAL HIGH (ref 12.0–15.0)
MCH: 28.9 pg (ref 26.0–34.0)
MCHC: 33.5 g/dL (ref 30.0–36.0)
MCV: 86.4 fL (ref 80.0–100.0)
Platelets: 305 K/uL (ref 150–400)
RBC: 5.46 MIL/uL — ABNORMAL HIGH (ref 3.87–5.11)
RDW: 13.4 % (ref 11.5–15.5)
WBC: 11.2 K/uL — ABNORMAL HIGH (ref 4.0–10.5)
nRBC: 0 % (ref 0.0–0.2)

## 2024-01-07 LAB — TRIGLYCERIDES: Triglycerides: 95 mg/dL (ref <150)

## 2024-01-07 LAB — BASIC METABOLIC PANEL WITH GFR
Anion gap: 16 — ABNORMAL HIGH (ref 5–15)
BUN: 21 mg/dL (ref 8–23)
CO2: 19 mmol/L — ABNORMAL LOW (ref 22–32)
Calcium: 9.2 mg/dL (ref 8.9–10.3)
Chloride: 101 mmol/L (ref 98–111)
Creatinine, Ser: 1.15 mg/dL — ABNORMAL HIGH (ref 0.44–1.00)
GFR, Estimated: 54 mL/min — ABNORMAL LOW (ref >60)
Glucose, Bld: 108 mg/dL — ABNORMAL HIGH (ref 70–99)
Potassium: 4.5 mmol/L (ref 3.5–5.1)
Sodium: 136 mmol/L (ref 135–145)

## 2024-01-07 LAB — MAGNESIUM: Magnesium: 2.1 mg/dL (ref 1.7–2.4)

## 2024-01-07 LAB — GLUCOSE, CAPILLARY
Glucose-Capillary: 114 mg/dL — ABNORMAL HIGH (ref 70–99)
Glucose-Capillary: 114 mg/dL — ABNORMAL HIGH (ref 70–99)
Glucose-Capillary: 136 mg/dL — ABNORMAL HIGH (ref 70–99)
Glucose-Capillary: 138 mg/dL — ABNORMAL HIGH (ref 70–99)
Glucose-Capillary: 144 mg/dL — ABNORMAL HIGH (ref 70–99)
Glucose-Capillary: 167 mg/dL — ABNORMAL HIGH (ref 70–99)
Glucose-Capillary: 208 mg/dL — ABNORMAL HIGH (ref 70–99)

## 2024-01-07 LAB — PHOSPHORUS: Phosphorus: 3.6 mg/dL (ref 2.5–4.6)

## 2024-01-07 MED ORDER — INSULIN ASPART 100 UNIT/ML IJ SOLN
0.0000 [IU] | INTRAMUSCULAR | Status: DC
  Administered 2024-01-08 – 2024-01-10 (×9): 1 [IU] via SUBCUTANEOUS

## 2024-01-07 NOTE — Progress Notes (Signed)
 Speech Language Pathology Treatment: Dysphagia  Patient Details Name: Amanda [name] MRN: [id number] DOB: [birth year] Today's Date: 01/07/2024 Time: 8362-8349 SLP Time Calculation (min) (ACUTE ONLY): 13 min  Assessment / Plan / Recommendation Clinical Impression  Pt was made NPO earlier this date after episode of AMS, that has since resolved (CTH negative for acute changes). Pt eager to resume POs and drank 4 oz of juice followed by ~8 oz of water  without signs clinically concerning for aspiration. She declined trials of purees or solids. Discussed with RN and plan to resume more conservative diet of full liquids over the weekend given potential for fluctuating mentation. If previously documented coughing persists or pt's mentation becomes altered, she should be made NPO. SLP will f/u to advance as able.    HPI HPI: [name] with polysubstance/cocaine abuse 3 hrs before presentation, who called EMS for AMS, lethargy and near syncope. Her condition deteriorated (unable to protect her airway and more lethargic) and got intubated. Clevidipine  was started for BP control and Propofol  for sedation. On NS 0.9% infusion.   9/22.: Emergent Burr holes and right frontal ventriculostomy placed by neurosurgery due to neurological worsening   Intubated 9/22-9/24. HTN, CHF (systolic and diastolic, EF 30%, G3DD, RVSP 50.3 mmHg), DM-2, and      SLP Plan  Continue with current plan of care          Recommendations  Diet recommendations: Thin liquid Liquids provided via: Teaspoon;Cup;Straw Medication Administration: Whole meds with liquid Supervision: Full supervision/cueing for compensatory strategies Compensations: Slow rate;Small sips/bites Postural Changes and/or Swallow Maneuvers: Seated upright 90 degrees                  Oral care BID   Frequent or constant Supervision/Assistance Dysphagia, unspecified (R13.10)     Continue with current plan of care     Damien Blumenthal,  M.A., CCC-SLP Speech Language Pathology, Acute Rehabilitation Services  Secure Chat preferred [phone]   01/07/2024, 4:57 PM

## 2024-01-07 NOTE — Progress Notes (Signed)
 [address] Camie Pickle, Neurosurgery PA made aware of the need for EVD orders as there are none. Camie at bedside to assess patient by 0830 and orders placed. EVD has been maintained at 10cm of H20 per her notes, no issues with the drain yet this shift.   0900- Dr. Donzetta made aware pt has very deep, productive cough, bringing up greenish sputum. CXR ordered.

## 2024-01-07 NOTE — Progress Notes (Signed)
 Physical Therapy Treatment Patient Details Name: Amanda [name] MRN: [id number] DOB: Aug 29, 1960 Today's Date: 01/07/2024   History of Present Illness 63 yo female presents to [facility] on 9/22 with lethargy and near-syncope s/p cocaine and opioid ingestion. 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 S/p R frontal ventriculostomy, burr holes 9/22. ETT 9/22-9/23. PMHx of cocaine abuse, CHF, CKD, chronic cough, HTN and DM2.    PT Comments  Pt lethargic initially, pt states name and starts to follow commands to get to EOB. once EOB and during transfers, pt increasingly lethargic and not answering PT or RN questions with eye closing. Pt initially max A for mobility to EOB, progressing to total A for transfers. Pt's RN involved in session, and states this is a change vs pt baseline. PT to continue to follow.      If plan is discharge home, recommend the following: Direct supervision/assist for medications management;Direct supervision/assist for financial management;Assist for transportation;Help with stairs or ramp for entrance;Supervision due to cognitive status;Two people to help with walking and/or transfers;Two people to help with bathing/dressing/bathroom   Can travel by private vehicle        Equipment Recommendations  None recommended by PT    Recommendations for Other Services       Precautions / Restrictions Precautions Precautions: Fall;Other (comment) Recall of Precautions/Restrictions: Impaired Precaution/Restrictions Comments: EVD (clamp prior to mobility), SBP < 160, R mitt, posey, a line Restrictions Weight Bearing Restrictions Per Provider Order: No     Mobility  Bed Mobility Overal bed mobility: Needs Assistance Bed Mobility: Supine to Sit, Sit to Supine     Supine to sit: Max assist Sit to supine: Max assist, +2 for physical assistance   General bed mobility comments: assist  for trunk and LE management, +2 boost up in bed.    Transfers Overall transfer level: Needs assistance Equipment used: 2 person hand held assist Transfers: Sit to/from Stand, Bed to chair/wheelchair/BSC Sit to Stand: Max assist Stand pivot transfers: Total assist         General transfer comment: max-total for power up, rise, steady, and pivot to/from recliner. stand x2, worse effort and completely total assist for back to bed.    Ambulation/Gait               General Gait Details: unable given arousal level   Stairs             Wheelchair Mobility     Tilt Bed    Modified Rankin (Stroke Patients Only) Modified Rankin (Stroke Patients Only) Pre-Morbid Rankin Score: No symptoms Modified Rankin: Severe disability     Balance Overall balance assessment: Needs assistance Sitting-balance support: Feet supported Sitting balance-Leahy Scale: Fair     Standing balance support: Bilateral upper extremity supported Standing balance-Leahy Scale: Poor Standing balance comment: reliant on external support from therapist                            Communication Communication Communication: Impaired Factors Affecting Communication: Reduced clarity of speech (hoarse voice quality)  Cognition Arousal: Obtunded Behavior During Therapy: Flat affect   PT - Cognitive impairments: Awareness, Attention, Difficult to assess, Safety/Judgement, Sequencing, Orientation Difficult to assess due to: Level of arousal Orientation impairments: Place, Time, Situation                   PT - Cognition Comments: pt lethargic initially, states name and  starts to follow commands to get to EOB. once EOB and during transfers, pt increasingly lethargic and not answering PT or RN questions with eye closing. Following commands: Impaired Following commands impaired: Follows one step commands inconsistently    Cueing Cueing Techniques: Verbal cues, Gestural cues, Tactile  cues, Visual cues  Exercises      General Comments        Pertinent Vitals/Pain Pain Assessment Pain Assessment: No/denies pain Pain Intervention(s): Monitored during session    Home Living                          Prior Function            PT Goals (current goals can now be found in the care plan section) Acute Rehab PT Goals Patient Stated Goal: unable Time For Goal Achievement: 01/19/24 Potential to Achieve Goals: Fair Progress towards PT goals: Not progressing toward goals - comment (level of arousal)    Frequency    Min 3X/week      PT Plan      Co-evaluation              AM-PAC PT 6 Clicks Mobility   Outcome Measure  Help needed turning from your back to your side while in a flat bed without using bedrails?: A Lot Help needed moving from lying on your back to sitting on the side of a flat bed without using bedrails?: Total Help needed moving to and from a bed to a chair (including a wheelchair)?: Total Help needed standing up from a chair using your arms (e.g., wheelchair or bedside chair)?: Total Help needed to walk in hospital room?: Total Help needed climbing 3-5 steps with a railing? : Total 6 Click Score: 7    End of Session   Activity Tolerance: Treatment limited secondary to medical complications (Comment) (lethargy) Patient left: with call bell/phone within reach;with restraints reapplied;with nursing/sitter in room;with bed alarm set;in bed Nurse Communication: Mobility status PT Visit Diagnosis: Unsteadiness on feet (R26.81);Difficulty in walking, not elsewhere classified (R26.2)     Time: 8846-8786 PT Time Calculation (min) (ACUTE ONLY): 20 min  Charges:    $Therapeutic Activity: 8-22 mins PT General Charges $$ ACUTE PT VISIT: 1 Visit                     Amanda [name], PT DPT Acute Rehabilitation Services Secure Chat Preferred  Office [phone]    Amanda [name] 01/07/2024, 2:09 PM

## 2024-01-07 NOTE — TOC Progression Note (Signed)
 Transition of Care [facility]) - Progression Note    Patient Details  Name: Amanda [name] MRN: [id number] Date of Birth: [birth year]  [facility]) CM/SW Contact  Inocente GORMAN Kindle, LCSW Phone Number: 01/07/2024, 9:50 AM  Clinical Narrative:    CSW following for SNF referrals once medically appropriate. Patient still with [facility].    Expected Discharge Plan: Skilled Nursing Facility Barriers to Discharge: Continued Medical Work up, English as a second language teacher, SNF Pending bed offer               Expected Discharge Plan and Services In-house Referral: Clinical Social Work   Post Acute Care Choice: Skilled Nursing Facility Living arrangements for the past 2 months: Single Family Home                                       Social Drivers of Health (SDOH) Interventions SDOH Screenings   Food Insecurity: No Food Insecurity (07/16/2023)   Received from [facility]  Housing: Low Risk  (07/16/2023)   Received from [facility]  Transportation Needs: No Transportation Needs (07/16/2023)   Received from [facility]  Utilities: Not At Risk (07/16/2023)   Received from [facility]  Alcohol Screen: Low Risk  (05/14/2023)  Financial Resource Strain: Low Risk  (07/16/2023)   Received from [facility]  Social Connections: Unknown (10/19/2022)   Received from [facility]  Tobacco Use: High Risk (01/03/2024)    Readmission Risk Interventions    03/26/2022   10:50 AM  Readmission Risk Prevention Plan  Transportation Screening Complete  HRI or Home Care Consult Complete  Social Work Consult for Recovery Care Planning/Counseling Complete  Palliative Care Screening Not Applicable  Medication Review Oceanographer) Referral to Pharmacy

## 2024-01-07 NOTE — Progress Notes (Signed)
 SLP Cancellation Note  Patient Details Name: Amanda [name] MRN: [id number] DOB: 09/09/1960   Cancelled treatment:       Reason Eval/Treat Not Completed: Fatigue/lethargy limiting ability to participate. Pt just had a bath and is sleeping. RN reports she did not tolerate dys 2 solids well at all, had coughing after bite of minced meat. Had subsequent coughing water . Asked RN to just offer sips of water  later today and stop food for now - will put pt on full liquids.  If coughing still occurs with water  then pt should be made full NPO and SLP will reassess on Saturday.   [name], MA CCC-SLP  Acute Rehabilitation Services Secure Chat Preferred Office [phone]  [name] 01/07/2024, 10:49 AM

## 2024-01-07 NOTE — Progress Notes (Signed)
   NAME:  Amanda [name], MRN:  [id number], DOB:  [birth year], LOS: 4 ADMISSION DATE:  01/03/2024, CONSULTATION DATE:  01/03/2024 REFERRING MD: Carita Senior, MD, CHIEF COMPLAINT:  AMS for one hour   History of Present Illness:  [name] with HTN, CHF (systolic and diastolic, EF 30%, G3DD, RVSP 50.3 mmHg), DM-2, and polysubstance/cocaine abuse 3 hrs before presentation, who called EMS for AMS, lethargy and near syncope. Her BP in ED 180/120, repeat BP 220/126. She was oriented to person only. She denied headache, chest pain, shortness of breath, abdominal pain, nausea and vomiting. Unable to give any meaningful history. Her condition deteriorated (unable to protect her airway and more lethargic) and got intubated. Clevidipine  was started for BP control and Propofol  for sedation. On NS 0.9% infusion.   Pertinent  Medical History  HTN, CHF systolic and diastolic (Jan 2025 Echo: EF 30%, G3DD, RVSP 50.3 mmHg), DM-2, polysubstance/cocaine abuse  Significant Hospital Events: Including procedures, antibiotic start and stop dates in addition to other pertinent events   01/03/2024: ED eval 9/22: PCCM called for admission   Interim History / Subjective:  Sedated on vent.  Objective    Blood pressure (!) 164/95, pulse 89, temperature 99.2 F (37.3 C), temperature source Axillary, resp. rate (!) 23, height 5' 5 (1.651 m), weight 56.1 kg, last menstrual period 12/24/2015, SpO2 100%.        Intake/Output Summary (Last 24 hours) at 01/07/2024 0844 Last data filed at 01/07/2024 0800 Gross per 24 hour  Intake 1443.07 ml  Output 599 ml  Net 844.07 ml   Filed Weights   01/05/24 0500 01/06/24 0500 01/07/24 0500  Weight: 56.3 kg 55.5 kg 56.1 kg   No acute events overnight, weaned off Precedex , now on Seroquel . EVD at 10 SBP goal<160 Has a cortrak, tube feeds at goal   Examination: General: No distress, follows commands early in the day then stop following commands. 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  Chest x-ray without any clear infiltrate.  Recently passed SLP and started on a diet.  Noted to have tachypnea after eating.  Given change in mental status.  Stat CT head ordered.   - Follow-up head CT -Will keep n.p.o. for now - Continue EVD per neurosurg -weaned off Precedex , now on Seroquel . - SBP goal now <160 - Continue PTA GDMT -Tube feeds at goal - She will need a repeat CT chest in 3 months for her right apical lesion    Dispo: EVD per neurosurgery  [name], MD [facility] Office: [phone]   See Amion for personal pager PCCM on call pager [phone] until 7pm. Please call Elink 7p-7a. [phone]

## 2024-01-07 NOTE — Progress Notes (Signed)
 1210- Went to get patient OOB with Alexa, PT.  Pt appeared drowsy but wanted to see if neuro status would improve with mobility. Once OOB to chair Pt was still moving extremities and eyes open but no longer following commands or answering any questions. Noted to be febrile and tachypenic. BP and HR WNL. Camie Pickle, Neurosurgery PA, Dr. Rosemarie and Dr. Zaida made aware via secure chat at 1213, stat head CT ordered and completed. Upon return to the room the pt was able to speak in complete sentences, repeating what this RN said but still not answering questions.  Dr. Rosemarie at bedside around 1310, she was back to baseline, answering all his questions and following commands/moving appropriately. Monitoring neuro status closely.

## 2024-01-07 NOTE — Progress Notes (Signed)
 Inpatient Rehab Admissions Coordinator:   Following for my colleague, Heron Leavell.  Note pt still with EVD.   Reche Lowers, PT, DPT Admissions Coordinator [phone] 01/07/24  11:57 AM

## 2024-01-07 NOTE — Progress Notes (Signed)
 Nutrition Follow-up  DOCUMENTATION CODES:   Non-severe (moderate) malnutrition in context of social or environmental circumstances (polysubstance abuse)  INTERVENTION:   Continue tube feeding via Cortrak tube: Osmolite 1.5 at 40 ml/h (960 ml per day)  Prosource TF20 60 ml daily  Provides 1520 kcal, 80 gm protein, 731 ml free water  daily  Continue thiamine  daily  NUTRITION DIAGNOSIS:   Moderate Malnutrition related to social / environmental circumstances (polysubstance abuse) as evidenced by mild muscle depletion, moderate fat depletion, mild fat depletion, moderate muscle depletion. Ongoing.   GOAL:   Patient will meet greater than or equal to 90% of their needs Met with TF at goal   MONITOR:   TF tolerance, Vent status, Labs  REASON FOR ASSESSMENT:   Consult Enteral/tube feeding initiation and management  ASSESSMENT:   Pt with hx HTN, CHF, type 2 diabetes, polysubstance abuse, and pulmonary edema. Admitted with AMS, lethargy, and deteriorating condition requiring intubation; diagnosed ICH.  Pt discussed during ICU rounds and with RN and MD.  Per RN pt started on diet 9/25 but coughing this am, pt now NPO again.  EVD remains.  Spoke with pt who opened eyes to voice but did not respond.   9/23 - extubated 9/24 - s/p cortrak placement; tip gastric  9/25 - Diet advanced to Dysphagia 2/thin; TF stopped  Medications reviewed and include: colaced, protonix , spironolactone , thiamine  (last dose 9/29) Osmolite 1.5 @ 40  Labs reviewed:  CBG's: 114-167  EVD: 207 ml   Current weight: 56.1 kg Admission weight: 56.1 kg  Diet Order:   Diet Order             Diet full liquid Room service appropriate? No; Fluid consistency: Thin  Diet effective now                   EDUCATION NEEDS:   Not appropriate for education at this time  Skin:  Skin Assessment: Reviewed RN Assessment  Last BM:  9/25  Height:   Ht Readings from Last 1 Encounters:  01/03/24 5'  5 (1.651 m)    Weight:   Wt Readings from Last 1 Encounters:  01/07/24 56.1 kg    Ideal Body Weight:  56.8 kg  BMI:  Body mass index is 20.58 kg/m.  Estimated Nutritional Needs:   Kcal:  1500-1700  Protein:  80-90 grams  Fluid:  1.5-1.7L  Robel Wuertz P., RD, LDN, CNSC See AMiON for contact information

## 2024-01-07 NOTE — Progress Notes (Signed)
 Occupational Therapy Treatment Patient Details Name: Amanda [name] MRN: [id number] DOB: 12-19-1960 Today's Date: 01/07/2024   History of present illness 63 yo female presents to [facility] on 9/22 with lethargy and near-syncope s/p cocaine and opioid ingestion. 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 S/p R frontal ventriculostomy, burr holes 9/22. ETT 9/22-9/23. PMHx of cocaine abuse, CHF, CKD, chronic cough, HTN and DM2.   OT comments  Patient with brief episode of AMS with PT earlier, f/u CT with no changes and Neuro MD checked on with no deficits.  OT with simple transfer to recliner with Mod A.  AIR has been recommended for post acute rehab, and OT will follow in the acute setting to address deficits.        If plan is discharge home, recommend the following:  Two people to help with walking and/or transfers;A lot of help with bathing/dressing/bathroom;Assistance with cooking/housework;Direct supervision/assist for medications management;Direct supervision/assist for financial management;Assist for transportation;Help with stairs or ramp for entrance   Equipment Recommendations       Recommendations for Other Services      Precautions / Restrictions Precautions Precautions: Fall;Other (comment) Recall of Precautions/Restrictions: Impaired Precaution/Restrictions Comments: EVD (clamp prior to mobility), SBP < 160, R mitt, posey, a line Restrictions Weight Bearing Restrictions Per Provider Order: No       Mobility Bed Mobility Overal bed mobility: Needs Assistance Bed Mobility: Supine to Sit     Supine to sit: Mod assist          Transfers Overall transfer level: Needs assistance Equipment used: 1 person hand held assist Transfers: Sit to/from Stand, Bed to chair/wheelchair/BSC Sit to Stand: Mod assist Stand pivot transfers: Mod assist               Balance Overall  balance assessment: Needs assistance Sitting-balance support: Feet supported Sitting balance-Leahy Scale: Fair     Standing balance support: Bilateral upper extremity supported Standing balance-Leahy Scale: Poor                             ADL either performed or assessed with clinical judgement   ADL Overall ADL's : Needs assistance/impaired Eating/Feeding: NPO   Grooming: Moderate assistance;Sitting   Upper Body Bathing: Moderate assistance;Sitting   Lower Body Bathing: Maximal assistance;Bed level   Upper Body Dressing : Moderate assistance;Sitting   Lower Body Dressing: Maximal assistance;Bed level                      Extremity/Trunk Assessment Upper Extremity Assessment RUE Deficits / Details: painful to touch, moves spontaneously against gravity LUE Deficits / Details: 3/5, difficult assessment 2/2 cognition   Lower Extremity Assessment RLE Deficits / Details: Moves spontaneously, at least 3/5 strength LLE Deficits / Details: Weaker than RLE, difficult to assess due to cognition. Able to wiggle toes   Cervical / Trunk Assessment Cervical / Trunk Assessment: Normal    Vision   Vision Assessment?: Vision impaired- to be further tested in functional context   Perception Perception Perception: Impaired Preception Impairment Details: Inattention/Neglect   Praxis Praxis Praxis: Not tested   Communication Communication Communication: Impaired Factors Affecting Communication: Reduced clarity of speech   Cognition Arousal: Lethargic Behavior During Therapy: Flat affect Cognition: Cognition impaired   Orientation impairments: Situation, Time, Place Awareness: Online awareness impaired, Intellectual awareness impaired Memory impairment (select all impairments): Short-term memory Attention impairment (select first level of  impairment): Sustained attention Executive functioning impairment (select all impairments): Problem solving, Reasoning                    Following commands: Impaired Following commands impaired: Follows one step commands inconsistently      Cueing   Cueing Techniques: Verbal cues, Gestural cues, Tactile cues, Visual cues  Exercises      Shoulder Instructions       General Comments  VSS O2    Pertinent Vitals/ Pain       Pain Assessment Pain Assessment: No/denies pain Pain Intervention(s): Monitored during session                                                          Frequency  Min 2X/week        Progress Toward Goals  OT Goals(current goals can now be found in the care plan section)  Progress towards OT goals: Progressing toward goals  Acute Rehab OT Goals OT Goal Formulation: Patient unable to participate in goal setting Time For Goal Achievement: 01/19/24 Potential to Achieve Goals: Good  Plan      Co-evaluation                 AM-PAC OT 6 Clicks Daily Activity     Outcome Measure   Help from another person eating meals?: Total Help from another person taking care of personal grooming?: A Lot Help from another person toileting, which includes using toliet, bedpan, or urinal?: A Lot Help from another person bathing (including washing, rinsing, drying)?: A Lot Help from another person to put on and taking off regular upper body clothing?: A Lot Help from another person to put on and taking off regular lower body clothing?: A Lot 6 Click Score: 11    End of Session    OT Visit Diagnosis: Unsteadiness on feet (R26.81);Other abnormalities of gait and mobility (R26.89);Muscle weakness (generalized) (M62.81)   Activity Tolerance Patient tolerated treatment well   Patient Left in chair;with call bell/phone within reach;with restraints reapplied   Nurse Communication Mobility status        Time: 1410-1430 OT Time Calculation (min): 20 min  Charges: OT General Charges $OT Visit: 1 Visit OT Treatments $Self Care/Home  Management : 8-22 mins  01/07/2024  RP, OTR/L  Acute Rehabilitation Services  Office:  [phone]   Amanda [name] 01/07/2024, 3:03 PM

## 2024-01-07 NOTE — Progress Notes (Signed)
 STROKE TEAM PROGRESS NOTE    SIGNIFICANT HOSPITAL EVENTS 9/21: Brought in by EMS due to confusion and lethargy.  Patient called EMS themselves.  Admitted to using cocaine approximately 3 hours before. 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  9/22.: Emergent Burr holes and right frontal ventriculostomy placed by neurosurgery due to neurological worsening. 9/23: Extubated  INTERIM HISTORY/SUBJECTIVE Daughter at the bedside. EVD in place. Currently on precedex , may need to consider seroquel  for longer term management  Patient is more alert and interactive.  She is dysarthric but can speak few short sentences and follows some simple commands.  She is moving all 4 extremities well purposefully.  Blood pressure adequately controlled.  Ventriculostomy still draining well OBJECTIVE CBC    Component Value Date/Time   WBC 11.2 (H) 01/07/2024 0657   RBC 5.46 (H) 01/07/2024 0657   HGB 15.8 (H) 01/07/2024 0657   HCT 47.2 (H) 01/07/2024 0657   PLT 305 01/07/2024 0657   MCV 86.4 01/07/2024 0657   MCH 28.9 01/07/2024 0657   MCHC 33.5 01/07/2024 0657   RDW 13.4 01/07/2024 0657   LYMPHSABS 1.9 01/03/2024 0145   MONOABS 0.6 01/03/2024 0145   EOSABS 0.1 01/03/2024 0145   BASOSABS 0.0 01/03/2024 0145   BMET    Component Value Date/Time   NA 136 01/07/2024 0657   K 4.5 01/07/2024 0657   CL 101 01/07/2024 0657   CO2 19 (L) 01/07/2024 0657   GLUCOSE 108 (H) 01/07/2024 0657   BUN 21 01/07/2024 0657   CREATININE 1.15 (H) 01/07/2024 0657   CREATININE 0.90 11/01/2013 1024   CALCIUM 9.2 01/07/2024 0657   GFRNONAA 54 (L) 01/07/2024 0657   GFRNONAA 74 11/01/2013 1024   Lab Results  Component Value Date   HGBA1C 6.6 (H) 01/03/2024   Lab Results  Component Value Date   CHOL 149 01/03/2024   HDL 60 01/03/2024   LDLCALC 77 01/03/2024   TRIG 95 01/07/2024   CHOLHDL 2.5 01/03/2024   Drugs of Abuse     Component Value Date/Time   LABOPIA NONE DETECTED  01/03/2024 0459   COCAINSCRNUR POSITIVE (A) 01/03/2024 0459   LABBENZ NONE DETECTED 01/03/2024 0459   AMPHETMU NONE DETECTED 01/03/2024 0459   THCU NONE DETECTED 01/03/2024 0459   LABBARB NONE DETECTED 01/03/2024 0459    Urinalysis    Component Value Date/Time   COLORURINE YELLOW 01/03/2024 0459   APPEARANCEUR CLEAR 01/03/2024 0459   LABSPEC >1.046 (H) 01/03/2024 0459   PHURINE 6.0 01/03/2024 0459   GLUCOSEU 50 (A) 01/03/2024 0459   HGBUR NEGATIVE 01/03/2024 0459   BILIRUBINUR NEGATIVE 01/03/2024 0459   BILIRUBINUR Small 10/11/2013 1200   KETONESUR NEGATIVE 01/03/2024 0459   PROTEINUR 30 (A) 01/03/2024 0459   UROBILINOGEN 0.2 10/11/2013 1200   UROBILINOGEN 0.2 02/28/2008 1612   NITRITE NEGATIVE 01/03/2024 0459   LEUKOCYTESUR NEGATIVE 01/03/2024 0459   IMAGING past 24 hours CT HEAD WO CONTRAST ( ) Result Date: 01/07/2024 CLINICAL DATA:  Provided history: Stroke, follow-up; neuro change. EXAM: CT HEAD WITHOUT CONTRAST TECHNIQUE: Contiguous axial images were obtained from the base of the skull through the vertex without intravenous contrast. RADIATION DOSE REDUCTION: This exam was performed according to the departmental dose-optimization program which includes automated exposure control, adjustment of the mA and/or kV according to patient size and/or use of iterative reconstruction technique. COMPARISON:  Head CT 01/04/2024.  Brain MRI 01/03/2024. FINDINGS: Brain: Unchanged position of a right frontal approach ventricular catheter, terminating within the anterior body/frontal horn  of the right lateral ventricle (abutting the septum pellucidum). Hemorrhage along the catheter tract has slightly increased since the head CT of 01/04/2024. An acute parenchymal hemorrhage centered within the right thalamus has not significantly changed in size, measuring 2.8 x 2.0 x 3.6 cm (10 mL) (remeasured on prior). As before, the hemorrhage partially effaces the third ventricle. Hemorrhage within the  occipital horns of both lateral ventricles persists, but has diminished. Unchanged size and configuration of the ventricular system. Unchanged subcentimeter focus of hyperdensity within the left basal ganglia, which could reflect contrast staining or hemorrhage. Vascular: No hyperdense vessel.  Atherosclerotic calcifications. Skull: Right frontal burr hole with traversing ventricular catheter. Sinuses/Orbits: No mass or acute finding within the imaged orbits. Mild mucosal thickening within the frontal sinuses inferiorly. Mild-to-moderate mucosal thickening within bilateral ethmoid air cells. 11 mm mucous retention cyst, and mild background mucosal thickening, within the right sphenoid sinus. Minimal mucosal thickening within the left sphenoid sinus. Other: Partially imaged nasoenteric tube. Right frontal scalp staples. IMPRESSION: 1. Unchanged position of a right frontal approach ventricular catheter. Hemorrhage along the catheter tract has slightly increased since the head CT of 01/04/2024. 2. 10 mL acute parenchymal hemorrhage centered within the right thalamus, unchanged in size. As before, the hemorrhage partially effaces the third ventricle. 3. Hemorrhage within the occipital horns of both lateral ventricles, decreased. 4. Unchanged size and configuration of the ventricular system. 5. Unchanged subcentimeter focus of hyperdensity within the left basal ganglia, which could reflect contrast staining or hemorrhage. 6. Paranasal sinus disease as described. Electronically Signed   By: Rockey Childs D.O.   On: 01/07/2024 13:22    Vitals:   01/07/24 1132 01/07/24 1200 01/07/24 1235 01/07/24 1300  BP:  (!) 153/96  (!) 140/85  Pulse:  87  88  Resp:  19  (!) 32  Temp: 98.9 F (37.2 C)  99.7 F (37.6 C)   TempSrc: Axillary  Axillary   SpO2:    100%  Weight:      Height:       PHYSICAL EXAM General: Critically ill-appearing patient laying in ICU bed.  EVD in place. Psych: Unable to assess due to patient's  condition CV: Regular rate and rhythm on monitor Respiratory: On 5L Calvin  GI: Abdomen soft and nontender  NEURO:  Mental Status: Agitated, no verbal output, not following commands  Speech/Language:  agitated but no verbal output.  Cranial Nerves:  II: Does not blink to threat consistently  III, IV, VI: Does not fixate or track. VII:  VIII: Able to assess, patient does not respond to voice. IX, X: Gag are intact. XI: Head is grossly midline. XII: Does not protrude tongue to command. Motor/Sensory:  Moves all extremities but left upper extremity and left lower extremity weaker than right  Resists passive ROM Coordination: Does not perform Gait: Deferred for patient safety  Most Recent NIH 18   ASSESSMENT/PLAN [name] is a 63 y.o. female with history of cocaine abuse, CHF, CKD, HTN and DM2 BIB EMS with confusion and lethargy. BP was 209/142 on arrival. CT head revealed an acute right gangliothalamic intraparenchymal hemorrhage with intraventricular extension and associated early hydrocephalus,  admitted for ICU monitoring and critical care.  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 No  herniation. Chronic ischemic white matter changes.   CTA head & neck  No emergent large vessel occlusion. Poor opacification of the v1 and v4 segments of the left vertebral artery. Mild atherosclerotic calcification of both carotid bifurcations without hemodynamically significant stenosis.   Repeat CTH:  Medial Right thalamic hematoma, estimated 13 mL and slightly larger since 0220 hours today. Intraventricular blood volume not significantly changed. Stable ventricle size, and as  before difficult to exclude a degree of acute lateral ventriculomegaly. MRI: When comparing across modalities, similar size of an intraparenchymal hemorrhage centered in the right thalamus and smaller intraparenchymal hemorrhage in the left basal ganglia. Mildly increased volume of intraventricular hemorrhage layering in the occipital horns. Right frontal approach ventriculostomy catheter with some hemorrhage and edema along the catheter tract. Now mild hydrocephalus appears slightly improved in comparison to recent CT head. No convincing transependymal flow of CSF. CT head- Right frontal approach ventriculostomy catheter in place, abutting the septum pellucidum, with new hemorrhage along the posterior margin of the tract, including within the caudate. Decreased size of intrathalamic hemorrhage. UDS: Positive for Cocaine 2D Echo: EF 40% LDL 77 HgbA1c 6.6 VTE prophylaxis - SCDs No antithrombotic prior to admission, now on No antithrombotic due to ICH Therapy recommendations:  CIR Disposition:  PENDING  Hydrocephalus S/p ventriculostomy Emergent evd placed 9/22 am by Neurosurgery Repeat imaging pending- MRI AM  Acute respiratory failure secondary to ICH CCM management, appreciate assistance Extubated 9/23 Precedex  for agitation   Hypertension Hx of CHF Hx of Pulmonary Edema Home meds: Carvedilol  3.125 mg twice daily, Lasix  20 mg as needed, Entresto  twice daily, spironolactone  25 mg daily Unstable Continue Cleviprex  gtt, wean as tolerated Restart home antihypertensives: Entresto , spironolactone , Coreg  As needed hydralazine  and labetalol  added for better blood pressure control Blood pressure goal less than 160  Hyperlipidemia Home meds:  none LDL ordered, goal < 70 Consider statin at discharge as needed  Diabetes type II,  Home meds:  Jardiance , no insulin  use HgbA1c 6.6, goal < 7.0 CBGs, SSI Recommend close follow-up with PCP   Tobacco Abuse Patient is a current smoker       Ready to quit? N/A Nicotine  replacement therapy provided  Substance Abuse Patient uses crack cocaine UDS positive for Cocaine      Ready to quit? N/A TOC consult for cessation placed  Dysphagia Patient has post-stroke dysphagia, SLP consulted    Diet   Diet NPO time specified   Advance diet as tolerated CorTrak in place     Continue strict blood pressure control systolic goal below 160.  Ventriculostomy is draining well.  Drain ventriculostomy as per neurosurgery.  Continue Haldol  and Seroquel  for agitation..  No family at the bedside.  Stat CT head repeated shows unchanged appearance of the right thalamic hemorrhage with intraventricular hemorrhage.  This patient is critically ill and at significant risk of neurological worsening, death and care requires constant monitoring of vital signs, hemodynamics,respiratory and cardiac monitoring, extensive review of multiple databases, frequent neurological assessment, discussion with family, other specialists and medical decision making of high complexity.I have made any additions or clarifications directly to the above note.This critical care time does not reflect procedure time, or teaching time or supervisory time of PA/NP/Med Resident etc but could involve care discussion time.  I spent 30 minutes of neurocritical care time  in the care of  this patient.        Eather Popp, MD Medical Director [facility] Pager: [phone] 01/07/2024 1:56 PM  To contact Stroke Continuity provider, please refer to [email or web address]. After hours, contact General Neurology

## 2024-01-07 NOTE — Progress Notes (Signed)
    Providing Compassionate, Quality Care - Together   NEUROSURGERY PROGRESS NOTE     S: No issues overnight.    O: EXAM:  BP (!) 164/95 Comment: post hydralazine , just gave oral coreg   Pulse 89   Temp 99.2 F (37.3 C) (Axillary)   Resp (!) 23   Ht 5' 5 (1.651 m)   Wt 56.1 kg   LMP 12/24/2015 (Within Days)   SpO2 100%   BMI 20.58 kg/m     Awake, alert Answers simple questions Speech fluent CNs grossly intact  Fcx4 EVD draining blood tinged CSF   ASSESSMENT:  63 y.o. with IPH and IVH, s/p EVD     PLAN: -Continue EVD at 10 cmH2O.  -Anticipate weaning early next week.  -Call w/ questions/concerns.   Camie Pickle, [facility]

## 2024-01-08 DIAGNOSIS — I610 Nontraumatic intracerebral hemorrhage in hemisphere, subcortical: Secondary | ICD-10-CM | POA: Diagnosis not present

## 2024-01-08 DIAGNOSIS — E440 Moderate protein-calorie malnutrition: Secondary | ICD-10-CM | POA: Diagnosis not present

## 2024-01-08 LAB — CBC WITH DIFFERENTIAL/PLATELET
Abs Immature Granulocytes: 0.05 K/uL (ref 0.00–0.07)
Basophils Absolute: 0 K/uL (ref 0.0–0.1)
Basophils Relative: 0 %
Eosinophils Absolute: 0 K/uL (ref 0.0–0.5)
Eosinophils Relative: 0 %
HCT: 44.1 % (ref 36.0–46.0)
Hemoglobin: 14.8 g/dL (ref 12.0–15.0)
Immature Granulocytes: 1 %
Lymphocytes Relative: 19 %
Lymphs Abs: 1.7 K/uL (ref 0.7–4.0)
MCH: 28.7 pg (ref 26.0–34.0)
MCHC: 33.6 g/dL (ref 30.0–36.0)
MCV: 85.6 fL (ref 80.0–100.0)
Monocytes Absolute: 0.7 K/uL (ref 0.1–1.0)
Monocytes Relative: 8 %
Neutro Abs: 6.3 K/uL (ref 1.7–7.7)
Neutrophils Relative %: 72 %
Platelets: 342 K/uL (ref 150–400)
RBC: 5.15 MIL/uL — ABNORMAL HIGH (ref 3.87–5.11)
RDW: 13.3 % (ref 11.5–15.5)
WBC: 8.8 K/uL (ref 4.0–10.5)
nRBC: 0 % (ref 0.0–0.2)

## 2024-01-08 LAB — BASIC METABOLIC PANEL WITH GFR
Anion gap: 15 (ref 5–15)
BUN: 26 mg/dL — ABNORMAL HIGH (ref 8–23)
CO2: 19 mmol/L — ABNORMAL LOW (ref 22–32)
Calcium: 8.9 mg/dL (ref 8.9–10.3)
Chloride: 99 mmol/L (ref 98–111)
Creatinine, Ser: 1.13 mg/dL — ABNORMAL HIGH (ref 0.44–1.00)
GFR, Estimated: 55 mL/min — ABNORMAL LOW (ref >60)
Glucose, Bld: 182 mg/dL — ABNORMAL HIGH (ref 70–99)
Potassium: 4.1 mmol/L (ref 3.5–5.1)
Sodium: 133 mmol/L — ABNORMAL LOW (ref 135–145)

## 2024-01-08 LAB — GLUCOSE, CAPILLARY
Glucose-Capillary: 142 mg/dL — ABNORMAL HIGH (ref 70–99)
Glucose-Capillary: 160 mg/dL — ABNORMAL HIGH (ref 70–99)
Glucose-Capillary: 162 mg/dL — ABNORMAL HIGH (ref 70–99)
Glucose-Capillary: 107 mg/dL — ABNORMAL HIGH (ref 70–99)
Glucose-Capillary: 177 mg/dL — ABNORMAL HIGH (ref 70–99)
Glucose-Capillary: 126 mg/dL — ABNORMAL HIGH (ref 70–99)

## 2024-01-08 LAB — MAGNESIUM: Magnesium: 2 mg/dL (ref 1.7–2.4)

## 2024-01-08 LAB — PHOSPHORUS: Phosphorus: 2.9 mg/dL (ref 2.5–4.6)

## 2024-01-08 MED ORDER — ORAL CARE MOUTH RINSE
15.0000 mL | OROMUCOSAL | Status: DC
  Administered 2024-01-08 – 2024-03-10 (×195): 15 mL via OROMUCOSAL

## 2024-01-08 MED ORDER — ORAL CARE MOUTH RINSE: 15.0000 mL | OROMUCOSAL | Status: DC | PRN

## 2024-01-08 NOTE — Progress Notes (Signed)
 Subjective: Patient reports no complaints no headache  Objective: Vital signs in last 24 hours: Temp:  [98.2 F (36.8 C)-101.2 F (38.4 C)] 100.4 F (38 C) (09/27 0400) Pulse Rate:  [69-89] 72 (09/27 0700) Resp:  [19-35] 25 (09/27 0700) BP: (114-172)/(75-105) 135/84 (09/27 0700) SpO2:  [92 %-100 %] 100 % (09/27 0700) Weight:  [58.8 kg] 58.8 kg (09/27 0500)  Intake/Output from previous day: 09/26 0701 - 09/27 0700 In: 879 [I.V.:20; NG/GT:859] Out: 477 [Urine:300; Drains:177] Intake/Output this shift: No intake/output data recorded.  Patient awake left hemiparesis  Lab Results: Recent Labs    01/07/24 0657 01/08/24 0453  WBC 11.2* 8.8  HGB 15.8* 14.8  HCT 47.2* 44.1  PLT 305 342   BMET Recent Labs    01/07/24 0657 01/08/24 0453  NA 136 133*  K 4.5 4.1  CL 101 99  CO2 19* 19*  GLUCOSE 108* 182*  BUN 21 26*  CREATININE 1.15* 1.13*  CALCIUM 9.2 8.9    Studies/Results: DG CHEST PORT 1 VIEW Result Date: 01/07/2024 EXAM: 1 VIEW(S) XRAY OF THE CHEST 01/07/2024 09:23:24 AM COMPARISON: 01/03/2024 CLINICAL HISTORY: tachypnea FINDINGS: LINES, TUBES AND DEVICES: Enteric tube in place, coursing below diaphragm with tip outside field of view. Interval extubation. The endotracheal tube has been removed. LUNGS AND PLEURA: Upper zone pulmonary vascular prominence and indistinctness compatible with pulmonary venous hypertension. Emphysema noted. No overt edema. No pleural effusion. No pneumothorax. HEART AND MEDIASTINUM: Stable cardiomegaly. BONES AND SOFT TISSUES: Lower thoracic spondylosis noted. No acute osseous abnormality. IMPRESSION: 1. Upper zone pulmonary vascular prominence and indistinctness compatible with pulmonary venous hypertension. No overt edema. 2. Stable cardiomegaly. 3. Emphysema. 4. Lower thoracic spondylosis. Electronically signed by: Ryan Salvage MD 01/07/2024 02:56 PM EDT RP Workstation: [id number]   CT HEAD WO CONTRAST ( ) Result Date:  01/07/2024 CLINICAL DATA:  Provided history: Stroke, follow-up; neuro change. EXAM: CT HEAD WITHOUT CONTRAST TECHNIQUE: Contiguous axial images were obtained from the base of the skull through the vertex without intravenous contrast. RADIATION DOSE REDUCTION: This exam was performed according to the departmental dose-optimization program which includes automated exposure control, adjustment of the mA and/or kV according to patient size and/or use of iterative reconstruction technique. COMPARISON:  Head CT 01/04/2024.  Brain MRI 01/03/2024. FINDINGS: Brain: Unchanged position of a right frontal approach ventricular catheter, terminating within the anterior body/frontal horn of the right lateral ventricle (abutting the septum pellucidum). Hemorrhage along the catheter tract has slightly increased since the head CT of 01/04/2024. An acute parenchymal hemorrhage centered within the right thalamus has not significantly changed in size, measuring 2.8 x 2.0 x 3.6 cm (10 mL) (remeasured on prior). As before, the hemorrhage partially effaces the third ventricle. Hemorrhage within the occipital horns of both lateral ventricles persists, but has diminished. Unchanged size and configuration of the ventricular system. Unchanged subcentimeter focus of hyperdensity within the left basal ganglia, which could reflect contrast staining or hemorrhage. Vascular: No hyperdense vessel.  Atherosclerotic calcifications. Skull: Right frontal burr hole with traversing ventricular catheter. Sinuses/Orbits: No mass or acute finding within the imaged orbits. Mild mucosal thickening within the frontal sinuses inferiorly. Mild-to-moderate mucosal thickening within bilateral ethmoid air cells. 11 mm mucous retention cyst, and mild background mucosal thickening, within the right sphenoid sinus. Minimal mucosal thickening within the left sphenoid sinus. Other: Partially imaged nasoenteric tube. Right frontal scalp staples. IMPRESSION: 1. Unchanged  position of a right frontal approach ventricular catheter. Hemorrhage along the catheter tract has slightly increased since the head CT of  01/04/2024. 2. 10 mL acute parenchymal hemorrhage centered within the right thalamus, unchanged in size. As before, the hemorrhage partially effaces the third ventricle. 3. Hemorrhage within the occipital horns of both lateral ventricles, decreased. 4. Unchanged size and configuration of the ventricular system. 5. Unchanged subcentimeter focus of hyperdensity within the left basal ganglia, which could reflect contrast staining or hemorrhage. 6. Paranasal sinus disease as described. Electronically Signed   By: Rockey Childs D.O.   On: 01/07/2024 13:22    Assessment/Plan: EVD in place functioning well continue drain for now challenge per Dr. Debby  LOS: 5 days     Amanda [name] 01/08/2024, 7:45 AM

## 2024-01-08 NOTE — Progress Notes (Signed)
   NAME:  Amanda [name], MRN:  [id number], DOB:  February 18, 1961, LOS: 5 ADMISSION DATE:  01/03/2024, CONSULTATION DATE:  01/08/24 REFERRING MD: Carita Senior, MD, CHIEF COMPLAINT:  AMS for one hour   History of Present Illness:  [name] with HTN, CHF (systolic and diastolic, EF 30%, G3DD, RVSP 50.3 mmHg), DM-2, and polysubstance/cocaine abuse 3 hrs before presentation, who called EMS for AMS, lethargy and near syncope. Her BP in ED 180/120, repeat BP 220/126. She was oriented to person only. She denied headache, chest pain, shortness of breath, abdominal pain, nausea and vomiting. Unable to give any meaningful history. Her condition deteriorated (unable to protect her airway and more lethargic) and got intubated. Clevidipine  was started for BP control and Propofol  for sedation. On NS 0.9% infusion.   Pertinent  Medical History  HTN, CHF systolic and diastolic (Jan 2025 Echo: EF 30%, G3DD, RVSP 50.3 mmHg), DM-2, polysubstance/cocaine abuse  Significant Hospital Events: Including procedures, antibiotic start and stop dates in addition to other pertinent events   01/03/2024: ED eval 9/22: PCCM called for admission   Interim History / Subjective:   Objective    Blood pressure (!) 150/92, pulse 72, temperature 98.4 F (36.9 C), temperature source Axillary, resp. rate (!) 27, height 5' 5 (1.651 m), weight 58.8 kg, last menstrual period 12/24/2015, SpO2 100%.        Intake/Output Summary (Last 24 hours) at 01/08/2024 0837 Last data filed at 01/08/2024 0800 Gross per 24 hour  Intake 919 ml  Output 482 ml  Net 437 ml   Filed Weights   01/06/24 0500 01/07/24 0500 01/08/24 0500  Weight: 55.5 kg 56.1 kg 58.8 kg   No acute events overnight, weaned off Precedex , now on Seroquel . 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 - SBP goal now <160 - Continue PTA GDMT - She will need a repeat CT chest in 3 months for her right apical lesion    Dispo: EVD per neurosurgery  [name], MD [facility] Office: [phone]   See Amion for personal pager PCCM on call pager [phone] until 7pm. Please call Elink 7p-7a. [phone]

## 2024-01-08 NOTE — Progress Notes (Addendum)
 Attempted to complete 0900 neuro check, when asked to state name and DOB patient stated no, no, no, I'm not doing this shit again attempted to educate patient on importance of answering orientation questions and completing frequent neuro assessments while patient is in ICU with an EVD in place but patient refused. Still moving everything equally but continued to shake her head no or refuse to answer when asked a question. Dr. Onetha made aware when rounding this morning that Pt doesn't always participate in assessment. Dr. Zaida aware of the most recent development.    1300-Pt's children at bedside, Pt is upset that she needs to get a bath, despite being soiled, requesting a cookie and upset when offered chocolate pudding or ice cream instead. Yelling at staff and family to shut-up and not to touch her. Son, Delvron was able to reason with Pt, she brushed her own teeth and drank some tea, family was able to convince Pt to get washed up then eat as she will be more comfortable. Family left and pt was tearful and agitated during bath. Once clean pt was very quiet during feeding, before leaving the room asked if pt required anything else and Pt stated just leave. Safety maintined, VSS.

## 2024-01-09 DIAGNOSIS — E440 Moderate protein-calorie malnutrition: Secondary | ICD-10-CM | POA: Diagnosis not present

## 2024-01-09 DIAGNOSIS — I610 Nontraumatic intracerebral hemorrhage in hemisphere, subcortical: Secondary | ICD-10-CM | POA: Diagnosis not present

## 2024-01-09 LAB — BASIC METABOLIC PANEL WITH GFR
Anion gap: 9 (ref 5–15)
BUN: 22 mg/dL (ref 8–23)
CO2: 21 mmol/L — ABNORMAL LOW (ref 22–32)
Calcium: 9 mg/dL (ref 8.9–10.3)
Chloride: 102 mmol/L (ref 98–111)
Creatinine, Ser: 0.94 mg/dL (ref 0.44–1.00)
GFR, Estimated: >60 mL/min (ref >60)
Glucose, Bld: 137 mg/dL — ABNORMAL HIGH (ref 70–99)
Potassium: 4.5 mmol/L (ref 3.5–5.1)
Sodium: 132 mmol/L — ABNORMAL LOW (ref 135–145)

## 2024-01-09 LAB — CBC WITH DIFFERENTIAL/PLATELET
Abs Immature Granulocytes: 0.03 K/uL (ref 0.00–0.07)
Basophils Absolute: 0 K/uL (ref 0.0–0.1)
Basophils Relative: 0 %
Eosinophils Absolute: 0 K/uL (ref 0.0–0.5)
Eosinophils Relative: 0 %
HCT: 43.9 % (ref 36.0–46.0)
Hemoglobin: 14.7 g/dL (ref 12.0–15.0)
Immature Granulocytes: 0 %
Lymphocytes Relative: 23 %
Lymphs Abs: 2.2 K/uL (ref 0.7–4.0)
MCH: 28.5 pg (ref 26.0–34.0)
MCHC: 33.5 g/dL (ref 30.0–36.0)
MCV: 85.1 fL (ref 80.0–100.0)
Monocytes Absolute: 0.8 K/uL (ref 0.1–1.0)
Monocytes Relative: 8 %
Neutro Abs: 6.5 K/uL (ref 1.7–7.7)
Neutrophils Relative %: 69 %
Platelets: 346 K/uL (ref 150–400)
RBC: 5.16 MIL/uL — ABNORMAL HIGH (ref 3.87–5.11)
RDW: 13.2 % (ref 11.5–15.5)
WBC: 9.6 K/uL (ref 4.0–10.5)
nRBC: 0 % (ref 0.0–0.2)

## 2024-01-09 LAB — GLUCOSE, CAPILLARY
Glucose-Capillary: 131 mg/dL — ABNORMAL HIGH (ref 70–99)
Glucose-Capillary: 147 mg/dL — ABNORMAL HIGH (ref 70–99)
Glucose-Capillary: 79 mg/dL (ref 70–99)
Glucose-Capillary: 137 mg/dL — ABNORMAL HIGH (ref 70–99)
Glucose-Capillary: 138 mg/dL — ABNORMAL HIGH (ref 70–99)
Glucose-Capillary: 129 mg/dL — ABNORMAL HIGH (ref 70–99)

## 2024-01-09 LAB — MAGNESIUM: Magnesium: 1.9 mg/dL (ref 1.7–2.4)

## 2024-01-09 MED ORDER — SACUBITRIL-VALSARTAN 97-103 MG PO TABS
1.0000 | ORAL_TABLET | Freq: Two times a day (BID) | ORAL | Status: DC
Start: 2024-01-09 — End: 2024-01-11
  Administered 2024-01-09 – 2024-01-11 (×4): 1 via ORAL
  Filled 2024-01-09 (×4): qty 1

## 2024-01-09 MED ORDER — DOCUSATE SODIUM 100 MG PO CAPS
100.0000 mg | ORAL_CAPSULE | Freq: Two times a day (BID) | ORAL | Status: DC
  Filled 2024-01-09: qty 1

## 2024-01-09 MED ORDER — SPIRONOLACTONE 25 MG PO TABS
25.0000 mg | ORAL_TABLET | Freq: Every day | ORAL | Status: DC
  Administered 2024-01-10 – 2024-01-11 (×2): 25 mg via ORAL
  Filled 2024-01-09 (×2): qty 1

## 2024-01-09 MED ORDER — MAGNESIUM SULFATE 2 GM/50ML IV SOLN
2.0000 g | Freq: Once | INTRAVENOUS | Status: AC
  Administered 2024-01-09: 2 g via INTRAVENOUS
  Filled 2024-01-09: qty 50

## 2024-01-09 MED ORDER — DOCUSATE SODIUM 100 MG PO CAPS: 100.0000 mg | ORAL_CAPSULE | Freq: Two times a day (BID) | ORAL | Status: DC | PRN

## 2024-01-09 MED ORDER — CARVEDILOL 12.5 MG PO TABS
25.0000 mg | ORAL_TABLET | Freq: Two times a day (BID) | ORAL | Status: DC
  Administered 2024-01-09 – 2024-01-11 (×4): 25 mg via ORAL
  Filled 2024-01-09 (×4): qty 2

## 2024-01-09 MED ORDER — BENZONATATE 100 MG PO CAPS
200.0000 mg | ORAL_CAPSULE | Freq: Three times a day (TID) | ORAL | Status: DC | PRN
  Administered 2024-01-09 – 2024-04-10 (×8): 200 mg via ORAL
  Filled 2024-01-09 (×8): qty 2

## 2024-01-09 MED ORDER — MENTHOL 3 MG MT LOZG
1.0000 | LOZENGE | Freq: Once | OROMUCOSAL | Status: AC
  Administered 2024-01-09: 3 mg via ORAL
  Filled 2024-01-09: qty 9

## 2024-01-09 MED ORDER — POLYETHYLENE GLYCOL 3350 17 G PO PACK
17.0000 g | PACK | Freq: Two times a day (BID) | ORAL | Status: DC
  Filled 2024-01-09 (×2): qty 1

## 2024-01-09 MED ORDER — MENTHOL 3 MG MT LOZG
1.0000 | LOZENGE | Freq: Two times a day (BID) | OROMUCOSAL | Status: DC | PRN
  Filled 2024-01-09: qty 9

## 2024-01-09 MED ORDER — DEXTROSE IN LACTATED RINGERS 5 % IV SOLN: INTRAVENOUS | Status: DC

## 2024-01-09 MED ORDER — POLYETHYLENE GLYCOL 3350 17 G PO PACK: 17.0000 g | PACK | Freq: Every day | ORAL | Status: DC | PRN

## 2024-01-09 MED ORDER — QUETIAPINE FUMARATE 25 MG PO TABS
50.0000 mg | ORAL_TABLET | Freq: Every day | ORAL | Status: DC
  Administered 2024-01-09 – 2024-01-10 (×2): 50 mg via ORAL
  Filled 2024-01-09 (×2): qty 2

## 2024-01-09 NOTE — Progress Notes (Signed)
 eLink Physician-Brief Progress Note Patient Name: Amanda [name] DOB: 03-Nov-1960 MRN: [id number]   Date of Service  01/09/2024  HPI/Events of Note  Cough, asking for lozenges or spary for throat pain from coughing.   eICU Interventions  Cephacol spray lozenges once orfered     Intervention Category Minor Interventions: Other:  Amanda [name] 01/09/2024, 6:20 AM

## 2024-01-09 NOTE — Progress Notes (Signed)
 Patient ID: Amanda [name], female   DOB: March 10, 1961, 63 y.o.   MRN: [id number] Confusion agitation since drain was raised to 20 we will lower back down to 10 and see if we can see improvement.

## 2024-01-09 NOTE — Progress Notes (Signed)
 This RN called Dr. Onetha to notifiy him of Pts neuro status change. Pt had normal neuro assessments until 1300hrs. This RN told Dr. Onetha that Pt is confused, agitated and aggressive, EVD draining at the same rate before Provider changed it, approx 3-4ccs per hour. Dr. Onetha told this RN to return Pt to 10 cm H2O. See new orders. Monitoring closely.

## 2024-01-09 NOTE — Progress Notes (Signed)
 Patient ID: Amanda [name], female   DOB: 05-21-1960, 63 y.o.   MRN: [id number] Patient doing well without complaints no headache awake appropriate vocal moves all extremities well  EVD functioning well raised level to 20 will see if she tolerates the challenge CT scan of the head in the morning

## 2024-01-09 NOTE — Progress Notes (Signed)
   NAME:  Amanda [name], MRN:  [id number], DOB:  1960-11-12, LOS: 6 ADMISSION DATE:  01/03/2024, CONSULTATION DATE:  01/09/24 REFERRING MD: Carita Senior, MD, CHIEF COMPLAINT:  AMS for one hour   History of Present Illness:  [name] with HTN, CHF (systolic and diastolic, EF 30%, G3DD, RVSP 50.3 mmHg), DM-2, and polysubstance/cocaine abuse 3 hrs before presentation, who called EMS for AMS, lethargy and near syncope. Her BP in ED 180/120, repeat BP 220/126. She was oriented to person only. She denied headache, chest pain, shortness of breath, abdominal pain, nausea and vomiting. Unable to give any meaningful history. Her condition deteriorated (unable to protect her airway and more lethargic) and got intubated. Clevidipine  was started for BP control and Propofol  for sedation. On NS 0.9% infusion.   Pertinent  Medical History  HTN, CHF systolic and diastolic (Jan 2025 Echo: EF 30%, G3DD, RVSP 50.3 mmHg), DM-2, polysubstance/cocaine abuse  Significant Hospital Events: Including procedures, antibiotic start and stop dates in addition to other pertinent events   01/03/2024: ED eval 9/22: PCCM called for admission   Interim History / Subjective:   Objective    Blood pressure 132/85, pulse 73, temperature 98.8 F (37.1 C), temperature source Axillary, resp. rate (!) 36, height 5' 5 (1.651 m), weight 57.2 kg, last menstrual period 12/24/2015, SpO2 100%.        Intake/Output Summary (Last 24 hours) at 01/09/2024 1011 Last data filed at 01/09/2024 0900 Gross per 24 hour  Intake 725 ml  Output 634 ml  Net 91 ml   Filed Weights   01/07/24 0500 01/08/24 0500 01/09/24 0500  Weight: 56.1 kg 58.8 kg 57.2 kg   No acute events overnight, weaned off Precedex , now on Seroquel . 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 Repeat CT ordered -SLP reevaluated, NPO when poor mentation and can have a diet when awake as she had no evidence of aspiration  - Continue EVD per neurosurg -weaned off Precedex , now on Seroquel . - SBP goal now <160 - Continue PTA GDMT - She will need a repeat CT chest in 3 months for her right apical lesion    Dispo: EVD per neurosurgery  [name], MD [facility] Office: [phone]   See Amion for personal pager PCCM on call pager [phone] until 7pm. Please call Elink 7p-7a. [phone]

## 2024-01-10 ENCOUNTER — Inpatient Hospital Stay (HOSPITAL_COMMUNITY)

## 2024-01-10 LAB — BASIC METABOLIC PANEL WITH GFR
Anion gap: 12 (ref 5–15)
BUN: 25 mg/dL — ABNORMAL HIGH (ref 8–23)
CO2: 20 mmol/L — ABNORMAL LOW (ref 22–32)
Calcium: 9 mg/dL (ref 8.9–10.3)
Chloride: 102 mmol/L (ref 98–111)
Creatinine, Ser: 0.99 mg/dL (ref 0.44–1.00)
GFR, Estimated: >60 mL/min (ref >60)
Glucose, Bld: 130 mg/dL — ABNORMAL HIGH (ref 70–99)
Potassium: 4.5 mmol/L (ref 3.5–5.1)
Sodium: 134 mmol/L — ABNORMAL LOW (ref 135–145)

## 2024-01-10 LAB — CBC WITH DIFFERENTIAL/PLATELET
Abs Immature Granulocytes: 0.05 K/uL (ref 0.00–0.07)
Basophils Absolute: 0 K/uL (ref 0.0–0.1)
Basophils Relative: 0 %
Eosinophils Absolute: 0 K/uL (ref 0.0–0.5)
Eosinophils Relative: 0 %
HCT: 43.9 % (ref 36.0–46.0)
Hemoglobin: 14.6 g/dL (ref 12.0–15.0)
Immature Granulocytes: 1 %
Lymphocytes Relative: 18 %
Lymphs Abs: 1.8 K/uL (ref 0.7–4.0)
MCH: 28.5 pg (ref 26.0–34.0)
MCHC: 33.3 g/dL (ref 30.0–36.0)
MCV: 85.6 fL (ref 80.0–100.0)
Monocytes Absolute: 0.8 K/uL (ref 0.1–1.0)
Monocytes Relative: 8 %
Neutro Abs: 7.2 K/uL (ref 1.7–7.7)
Neutrophils Relative %: 73 %
Platelets: 361 K/uL (ref 150–400)
RBC: 5.13 MIL/uL — ABNORMAL HIGH (ref 3.87–5.11)
RDW: 13.2 % (ref 11.5–15.5)
WBC: 9.9 K/uL (ref 4.0–10.5)
nRBC: 0 % (ref 0.0–0.2)

## 2024-01-10 LAB — GLUCOSE, CAPILLARY
Glucose-Capillary: 123 mg/dL — ABNORMAL HIGH (ref 70–99)
Glucose-Capillary: 129 mg/dL — ABNORMAL HIGH (ref 70–99)
Glucose-Capillary: 147 mg/dL — ABNORMAL HIGH (ref 70–99)
Glucose-Capillary: 154 mg/dL — ABNORMAL HIGH (ref 70–99)
Glucose-Capillary: 156 mg/dL — ABNORMAL HIGH (ref 70–99)
Glucose-Capillary: 169 mg/dL — ABNORMAL HIGH (ref 70–99)

## 2024-01-10 LAB — MAGNESIUM: Magnesium: 2.4 mg/dL (ref 1.7–2.4)

## 2024-01-10 LAB — TRIGLYCERIDES: Triglycerides: 150 mg/dL — ABNORMAL HIGH (ref <150)

## 2024-01-10 NOTE — Plan of Care (Signed)
  Problem: Safety: Goal: Non-violent Restraint(s) Outcome: Progressing   Problem: Coping: Goal: Ability to adjust to condition or change in health will improve Outcome: Progressing   Problem: Fluid Volume: Goal: Ability to maintain a balanced intake and output will improve Outcome: Progressing   Problem: Nutritional: Goal: Maintenance of adequate nutrition will improve Outcome: Progressing   Problem: Skin Integrity: Goal: Risk for impaired skin integrity will decrease Outcome: Progressing   Problem: Tissue Perfusion: Goal: Adequacy of tissue perfusion will improve Outcome: Progressing   Problem: Education: Goal: Knowledge of disease or condition will improve Outcome: Progressing Goal: Knowledge of secondary prevention will improve (MUST DOCUMENT ALL) Outcome: Progressing Goal: Knowledge of patient specific risk factors will improve (DELETE if not current risk factor) Outcome: Progressing   Problem: Intracerebral Hemorrhage Tissue Perfusion: Goal: Complications of Intracerebral Hemorrhage will be minimized Outcome: Progressing   Problem: Coping: Goal: Will verbalize positive feelings about self Outcome: Progressing Goal: Will identify appropriate support needs Outcome: Progressing   Problem: Health Behavior/Discharge Planning: Goal: Goals will be collaboratively established with patient/family Outcome: Progressing   Problem: Nutrition: Goal: Risk of aspiration will decrease Outcome: Progressing Goal: Dietary intake will improve Outcome: Progressing

## 2024-01-10 NOTE — Progress Notes (Signed)
 Per Camie Pickle PA place EVD at 15 Cm H2O

## 2024-01-10 NOTE — Progress Notes (Signed)
    Providing Compassionate, Quality Care - Together   NEUROSURGERY PROGRESS NOTE     S: NAEs o/n. EVD increase to 20 cmH2O with agitation. Returned to baseline when moved back down to 10.    O: EXAM:  BP 105/68   Pulse 77   Temp 97.7 F (36.5 C) (Axillary)   Resp (!) 34   Ht 5' 5 (1.651 m)   Wt 59.6 kg   LMP 12/24/2015 (Within Days)   SpO2 91%   BMI 21.87 kg/m   Awake, alert.  Speech fluent FC RUE, BLE Minimal movement LUE  CT HEAD WO CONTRAST ( ) Result Date: 01/10/2024 CLINICAL DATA:  63 year old female with medial right thalamic hemorrhage presenting on 01/03/2024. Ventriculostomy. Subsequent encounter. EXAM: CT HEAD WITHOUT CONTRAST TECHNIQUE: Contiguous axial images were obtained from the base of the skull through the vertex without intravenous contrast. RADIATION DOSE REDUCTION: This exam was performed according to the departmental dose-optimization program which includes automated exposure control, adjustment of the mA and/or kV according to patient size and/or use of iterative reconstruction technique. COMPARISON:  01/07/2024 head CT and earlier. FINDINGS: Brain: Hyperdense right thalamic hemorrhage appearing more indistinct and mildly fading over this series of exams. Blood products there are now roughly 30 by 20 x 31 mm (AP by transverse by CC) for an estimated volume of 9 mL now, versus 13 mL on 01/03/2024. Subarachnoid blood and intraventricular hemorrhage also mildly decreased over this series of exams. And decreased small volume of blood along the course of right superior frontal approach ventriculostomy also. Stable ventricle size. Stable mild mass effect in the region of the right thalamus. No areas of progressive hemorrhage. Contralateral small left basal ganglia hemorrhage also noted, and regressed since 01/03/2024 (series 3, image 19 now). Stable gray-white matter differentiation throughout the brain. Basilar cisterns remain patent. Vascular: Calcified  atherosclerosis at the skull base. Skull: Stable right superior burr hole. Sinuses/Orbits: Partially visible left nasoenteric tube remaining in place. Stable paranasal sinus mucosal thickening and opacification. Tympanic cavities mastoids remain well aerated. Other: Stable postoperative changes at the right convexity related to EVD. Visualized orbit soft tissues are within normal limits. IMPRESSION: 1. Gradual regression of right thalamic hemorrhage, now estimated at 9 mL. With stable IVH and SAH both improved since presentation. Stable right superior approach EVD with decreased small volume of blood along the tract. And a contralateral small left basal ganglia hemorrhage also appears regressed since presentation (series 3, image 19 now). 2. Stable ventricle size. Mild residual intracranial mass effect. No new intracranial abnormality. Electronically Signed   By: VEAR Hurst M.D.   On: 01/10/2024 05:40     ASSESSMENT:  63 y.o. with IPH and IVH, s/p EVD. IPH improved on CTH this AM.     PLAN: -Will attempt to wean at 15 cmH2O -Call w/ questions/concerns.   Camie Pickle, [facility]

## 2024-01-10 NOTE — Progress Notes (Signed)
 Speech Language Pathology Treatment: Dysphagia  Patient Details Name: Amanda [name] MRN: [id number] DOB: May 21, 1960 Today's Date: 01/10/2024 Time: 1008-1020 SLP Time Calculation (min) (ACUTE ONLY): 12 min  Assessment / Plan / Recommendation Clinical Impression  Pt very drowsy this am, but wakes up and agrees to sips of water . More dysarthric today than in prior sessions and kept eyes closed. Despite this, pt consumed straw sips of water  and juice without coughing or signs of dysphagia this date. Agree with continuing full liquids until arousal improves given appearance of tolerance even when drowsy. Will f/u   HPI HPI: [name] with polysubstance/cocaine abuse 3 hrs before presentation, who called EMS for AMS, lethargy and near syncope. Her condition deteriorated (unable to protect her airway and more lethargic) and got intubated. Clevidipine  was started for BP control and Propofol  for sedation. On NS 0.9% infusion.   9/22.: Emergent Burr holes and right frontal ventriculostomy placed by neurosurgery due to neurological worsening   Intubated 9/22-9/24. HTN, CHF (systolic and diastolic, EF 30%, G3DD, RVSP 50.3 mmHg), DM-2, and      SLP Plan  Continue with current plan of care          Recommendations  Diet recommendations: Thin liquid Liquids provided via: Straw Medication Administration: Whole meds with liquid Supervision: Full supervision/cueing for compensatory strategies Compensations: Slow rate;Small sips/bites Postural Changes and/or Swallow Maneuvers: Seated upright 90 degrees                  Oral care BID   Frequent or constant Supervision/Assistance Dysphagia, unspecified (R13.10)     Continue with current plan of care     Pernell Dikes, [facility]  01/10/2024, 10:22 AM

## 2024-01-10 NOTE — Progress Notes (Signed)
   NAME:  Amanda [name], MRN:  [id number], DOB:  Jul 20, 1960, LOS: 7 ADMISSION DATE:  01/03/2024, CONSULTATION DATE:  01/10/24 REFERRING MD: Carita Senior, MD, CHIEF COMPLAINT:  AMS for one hour   History of Present Illness:  [name] with HTN, CHF (systolic and diastolic, EF 30%, G3DD, RVSP 50.3 mmHg), DM-2, and polysubstance/cocaine abuse 3 hrs before presentation, who called EMS for AMS, lethargy and near syncope. Her BP in ED 180/120, repeat BP 220/126. She was oriented to person only. She denied headache, chest pain, shortness of breath, abdominal pain, nausea and vomiting. Unable to give any meaningful history. Her condition deteriorated (unable to protect her airway and more lethargic) and got intubated. Clevidipine  was started for BP control and Propofol  for sedation. On NS 0.9% infusion.   Pertinent  Medical History  HTN, CHF systolic and diastolic (Jan 2025 Echo: EF 30%, G3DD, RVSP 50.3 mmHg), DM-2, polysubstance/cocaine abuse  Significant Hospital Events: Including procedures, antibiotic start and stop dates in addition to other pertinent events   01/03/2024: ED eval 9/22: PCCM called for admission   Interim History / Subjective:   Objective    Blood pressure (!) 138/96, pulse 90, temperature 98.3 F (36.8 C), temperature source Oral, resp. rate (!) 30, height 5' 5 (1.651 m), weight 59.6 kg, last menstrual period 12/24/2015, SpO2 96%.        Intake/Output Summary (Last 24 hours) at 01/10/2024 1521 Last data filed at 01/10/2024 1400 Gross per 24 hour  Intake 1050 ml  Output 645 ml  Net 405 ml   Filed Weights   01/08/24 0500 01/09/24 0500 01/10/24 0500  Weight: 58.8 kg 57.2 kg 59.6 kg   No acute events overnight, weaned off Precedex , now on Seroquel . EVD at 10 SBP goal<160 Has a cortrak, tube feeds at goal   Examination: General: ill apearing, sleeping but wakes up. 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 Repeat CT ordered -SLP reevaluated, NPO when poor mentation and can have a diet when awake as she had no evidence of aspiration  - Continue EVD per neurosurg - off Precedex , now on Seroquel . - SBP goal now <160 - Continue PTA GDMT - She will need a repeat CT chest in 3 months for her right apical lesion    Dispo: EVD per neurosurgery  5 minutes of critical care time spent caring for this patient excluding procedures.  Tamela Stakes, MD  Attending Physician, Critical Care Medicine [facility] See Amion for pager If no response to pager, please call [phone] until 7pm After 7pm, Please call E-link [phone]

## 2024-01-11 ENCOUNTER — Other Ambulatory Visit: Payer: Self-pay | Admitting: Neurosurgery

## 2024-01-11 ENCOUNTER — Inpatient Hospital Stay (HOSPITAL_COMMUNITY)

## 2024-01-11 DIAGNOSIS — G919 Hydrocephalus, unspecified: Secondary | ICD-10-CM | POA: Diagnosis not present

## 2024-01-11 DIAGNOSIS — I5040 Unspecified combined systolic (congestive) and diastolic (congestive) heart failure: Secondary | ICD-10-CM

## 2024-01-11 DIAGNOSIS — E119 Type 2 diabetes mellitus without complications: Secondary | ICD-10-CM

## 2024-01-11 DIAGNOSIS — I610 Nontraumatic intracerebral hemorrhage in hemisphere, subcortical: Secondary | ICD-10-CM | POA: Diagnosis not present

## 2024-01-11 LAB — PROCALCITONIN: Procalcitonin: 0.12 ng/mL

## 2024-01-11 LAB — URINALYSIS, ROUTINE W REFLEX MICROSCOPIC
Bacteria, UA: NONE SEEN
Bilirubin Urine: NEGATIVE
Glucose, UA: NEGATIVE mg/dL
Hgb urine dipstick: NEGATIVE
Ketones, ur: NEGATIVE mg/dL
Nitrite: NEGATIVE
Protein, ur: 100 mg/dL — AB
Specific Gravity, Urine: 1.032 — ABNORMAL HIGH (ref 1.005–1.030)
pH: 5 (ref 5.0–8.0)

## 2024-01-11 LAB — GLUCOSE, CAPILLARY
Glucose-Capillary: 134 mg/dL — ABNORMAL HIGH (ref 70–99)
Glucose-Capillary: 131 mg/dL — ABNORMAL HIGH (ref 70–99)

## 2024-01-11 MED ORDER — CHLORHEXIDINE GLUCONATE CLOTH 2 % EX PADS: 6.0000 | MEDICATED_PAD | Freq: Once | CUTANEOUS | Status: AC

## 2024-01-11 MED ORDER — CEFAZOLIN SODIUM-DEXTROSE 2-4 GM/100ML-% IV SOLN
2.0000 g | INTRAVENOUS | Status: AC
  Administered 2024-01-13: 2 g via INTRAVENOUS
  Filled 2024-01-11: qty 100

## 2024-01-11 MED ORDER — HYDROXYZINE HCL 10 MG PO TABS
10.0000 mg | ORAL_TABLET | Freq: Three times a day (TID) | ORAL | Status: DC | PRN
  Administered 2024-01-11 – 2024-01-15 (×4): 10 mg
  Filled 2024-01-11 (×3): qty 1

## 2024-01-11 MED ORDER — DOCUSATE SODIUM 50 MG/5ML PO LIQD
50.0000 mg | Freq: Two times a day (BID) | ORAL | Status: DC
  Administered 2024-01-12 – 2024-01-13 (×3): 50 mg
  Filled 2024-01-11 (×3): qty 10

## 2024-01-11 MED ORDER — SPIRONOLACTONE 25 MG PO TABS
25.0000 mg | ORAL_TABLET | Freq: Every day | ORAL | Status: DC
  Administered 2024-01-12 – 2024-01-16 (×5): 25 mg
  Filled 2024-01-11 (×5): qty 1

## 2024-01-11 MED ORDER — POLYETHYLENE GLYCOL 3350 17 G PO PACK
17.0000 g | PACK | Freq: Two times a day (BID) | ORAL | Status: DC
  Administered 2024-01-12 – 2024-01-13 (×3): 17 g
  Filled 2024-01-11 (×3): qty 1

## 2024-01-11 MED ORDER — QUETIAPINE FUMARATE 25 MG PO TABS
50.0000 mg | ORAL_TABLET | Freq: Every day | ORAL | Status: DC
  Administered 2024-01-11 – 2024-01-13 (×3): 50 mg
  Filled 2024-01-11 (×3): qty 2

## 2024-01-11 MED ORDER — SACUBITRIL-VALSARTAN 97-103 MG PO TABS
1.0000 | ORAL_TABLET | Freq: Two times a day (BID) | ORAL | Status: DC
  Administered 2024-01-11 – 2024-01-16 (×10): 1
  Filled 2024-01-11 (×12): qty 1

## 2024-01-11 MED ORDER — CARVEDILOL 12.5 MG PO TABS
25.0000 mg | ORAL_TABLET | Freq: Two times a day (BID) | ORAL | Status: DC
  Administered 2024-01-11 – 2024-01-16 (×11): 25 mg
  Filled 2024-01-11 (×11): qty 2

## 2024-01-11 NOTE — Progress Notes (Signed)
 Occupational Therapy Treatment Patient Details Name: Amanda [name] MRN: [id number] DOB: 04/13/1961 Today's Date: 01/11/2024   History of present illness 63 yo female [facility]/22 with lethargy and near-syncope s/p cocaine and opioid ingestion. 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 S/p R frontal ventriculostomy, burr holes 9/22. ETT 9/22-9/23. PMHx of cocaine abuse, CHF, CKD, chronic cough, HTN and DM2.   OT comments  Patient with fair progress toward patient focused goals.  Improved alertness and ability to participate this session.  Still needing +2 for basic transfers, but Min A with improved pivotal steps.  Patient able to perform seated grooming with supervision and able to grasp a cup and drink from a straw with CGA.  OT to continue efforts in the acute setting to address deficits, and Patient will benefit from intensive inpatient follow-up therapy, >3 hours/day.      If plan is discharge home, recommend the following:  Two people to help with walking and/or transfers;A lot of help with bathing/dressing/bathroom;Assistance with cooking/housework;Direct supervision/assist for medications management;Direct supervision/assist for financial management;Assist for transportation;Help with stairs or ramp for entrance   Equipment Recommendations       Recommendations for Other Services      Precautions / Restrictions Precautions Precautions: Fall;Other (comment) Recall of Precautions/Restrictions: Impaired Precaution/Restrictions Comments: EVD (clamp prior to mobility), SBP < 160, R mitt, posey, a line Restrictions Weight Bearing Restrictions Per Provider Order: No       Mobility Bed Mobility Overal bed mobility: Needs Assistance Bed Mobility: Supine to Sit     Supine to sit: Mod assist          Transfers Overall transfer level: Needs assistance Equipment used: 2  person hand held assist Transfers: Sit to/from Stand, Bed to chair/wheelchair/BSC Sit to Stand: Min assist, +2 physical assistance     Step pivot transfers: Mod assist, +2 physical assistance, +2 safety/equipment           Balance Overall balance assessment: Needs assistance Sitting-balance support: Feet supported Sitting balance-Leahy Scale: Fair     Standing balance support: Bilateral upper extremity supported Standing balance-Leahy Scale: Poor                             ADL either performed or assessed with clinical judgement   ADL Overall ADL's : Needs assistance/impaired Eating/Feeding: Moderate assistance;Sitting   Grooming: Moderate assistance;Sitting           Upper Body Dressing : Moderate assistance;Sitting   Lower Body Dressing: Maximal assistance;Sit to/from stand;+2 for physical assistance   Toilet Transfer: Minimal assistance;+2 for physical assistance                  Extremity/Trunk Assessment Upper Extremity Assessment Upper Extremity Assessment: Generalized weakness   Lower Extremity Assessment Lower Extremity Assessment: Defer to PT evaluation        Vision   Vision Assessment?: Vision impaired- to be further tested in functional context   Perception Perception Perception: Impaired Preception Impairment Details: Inattention/Neglect   Praxis Praxis Praxis: Not tested   Communication Communication Communication: Impaired Factors Affecting Communication: Reduced clarity of speech   Cognition Arousal: Lethargic Behavior During Therapy: Flat affect Cognition: Cognition impaired       Memory impairment (select all impairments): Short-term memory, Working memory Attention impairment (select first level of impairment): Sustained attention Executive functioning impairment (select all impairments): Initiation, Sequencing  Following commands: Impaired Following commands impaired: Follows one  step commands inconsistently      Cueing   Cueing Techniques: Verbal cues, Gestural cues, Tactile cues, Visual cues  Exercises      Shoulder Instructions       General Comments  VSS on RA    Pertinent Vitals/ Pain       Pain Assessment Pain Assessment: Faces Faces Pain Scale: Hurts a little bit Pain Location: R hand to touch Pain Descriptors / Indicators: Tender, Aching Pain Intervention(s): Monitored during session                                                          Frequency  Min 2X/week        Progress Toward Goals  OT Goals(current goals can now be found in the care plan section)     Acute Rehab OT Goals OT Goal Formulation: Patient unable to participate in goal setting Time For Goal Achievement: 01/19/24 Potential to Achieve Goals: Good  Plan      Co-evaluation    PT/OT/SLP Co-Evaluation/Treatment: Yes Reason for Co-Treatment: Complexity of the patient's impairments (multi-system involvement);Necessary to address cognition/behavior during functional activity;For patient/therapist safety;To address functional/ADL transfers   OT goals addressed during session: ADL's and self-care;Strengthening/ROM      AM-PAC OT 6 Clicks Daily Activity     Outcome Measure   Help from another person eating meals?: A Lot Help from another person taking care of personal grooming?: A Lot Help from another person toileting, which includes using toliet, bedpan, or urinal?: A Lot Help from another person bathing (including washing, rinsing, drying)?: A Lot Help from another person to put on and taking off regular upper body clothing?: A Lot Help from another person to put on and taking off regular lower body clothing?: A Lot 6 Click Score: 12    End of Session    OT Visit Diagnosis: Unsteadiness on feet (R26.81);Other abnormalities of gait and mobility (R26.89);Muscle weakness (generalized) (M62.81)   Activity Tolerance Patient tolerated  treatment well   Patient Left in chair;with call bell/phone within reach;with restraints reapplied   Nurse Communication Mobility status        Time: 8784-8757 OT Time Calculation (min): 27 min  Charges: OT General Charges $OT Visit: 1 Visit OT Treatments $Self Care/Home Management : 8-22 mins  01/11/2024  RP, OTR/L  Acute Rehabilitation Services  Office:  [phone]   Amanda [name] 01/11/2024, 1:40 PM

## 2024-01-11 NOTE — Progress Notes (Signed)
 Inpatient Rehab Admissions:  Still with EVD. Increased agitation with raising to 20 yesterday and lowered back to 10. Will follow.   Reche Lowers, PT, DPT CIR Admissions Coordinator  [phone]

## 2024-01-11 NOTE — TOC Progression Note (Signed)
 Transition of Care [facility]) - Progression Note    Patient Details  Name: Amanda [name] MRN: [id number] Date of Birth: 1960/07/12  [facility] CM/SW Contact  Inocente GORMAN Kindle, LCSW Phone Number: 01/11/2024, 9:44 AM  Clinical Narrative:    CSW continuing to follow for needs. Patient still with EVD.    Expected Discharge Plan: Skilled Nursing Facility Barriers to Discharge: Continued Medical Work up, English as a second language teacher, SNF Pending bed offer               Expected Discharge Plan and Services In-house Referral: Clinical Social Work   Post Acute Care Choice: Skilled Nursing Facility Living arrangements for the past 2 months: Single Family Home                                       Social Drivers of Health (SDOH) Interventions SDOH Screenings   Food Insecurity: No Food Insecurity (07/16/2023)   Received from [facility]  Housing: Low Risk  (07/16/2023)   Received from [facility]  Transportation Needs: No Transportation Needs (07/16/2023)   Received from [facility]  Utilities: Not At Risk (07/16/2023)   Received from [facility]  Alcohol Screen: Low Risk  (05/14/2023)  Financial Resource Strain: Low Risk  (07/16/2023)   Received from [facility]  Social Connections: Unknown (10/19/2022)   Received from [facility]  Tobacco Use: High Risk (01/03/2024)    Readmission Risk Interventions    03/26/2022   10:50 AM  Readmission Risk Prevention Plan  Transportation Screening Complete  HRI or Home Care Consult Complete  Social Work Consult for Recovery Care Planning/Counseling Complete  Palliative Care Screening Not Applicable  Medication Review Oceanographer) Referral to Pharmacy

## 2024-01-11 NOTE — Progress Notes (Signed)
    Providing Compassionate, Quality Care - Together   NEUROSURGERY PROGRESS NOTE     S: No issues overnight.    O: EXAM:  BP (!) 146/89   Pulse 84   Temp 100 F (37.8 C) (Axillary)   Resp (!) 24   Ht 5' 5 (1.651 m)   Wt 55.6 kg   LMP 12/24/2015 (Within Days)   SpO2 99%   BMI 20.40 kg/m     Awake, alert.  Speech fluent FC RUE, BLE Minimal movement LUE   ASSESSMENT:  63 y.o. with IPH and IVH, s/p EVD tolerating at 15cm H2O    PLAN: -Increase EVD to 20cm H2O. Likely clamp trial tmrw if she tolerates this increase today.  -Call w/ questions/concerns.   Camie Pickle, [facility]

## 2024-01-11 NOTE — Progress Notes (Signed)
   NAME:  Amanda [name], MRN:  [id number], DOB:  Nov 01, 1960, LOS: 8 ADMISSION DATE:  01/03/2024, CONSULTATION DATE:  01/11/24 REFERRING MD: Carita Senior, MD, CHIEF COMPLAINT:  AMS for one hour   History of Present Illness:  [name] with HTN, CHF (systolic and diastolic, EF 30%, G3DD, RVSP 50.3 mmHg), DM-2, and polysubstance/cocaine abuse 3 hrs before presentation, who called EMS for AMS, lethargy and near syncope. Her BP in ED 180/120, repeat BP 220/126. She was oriented to person only. She denied headache, chest pain, shortness of breath, abdominal pain, nausea and vomiting. Unable to give any meaningful history. Her condition deteriorated (unable to protect her airway and more lethargic) and got intubated. Clevidipine  was started for BP control and Propofol  for sedation. On NS 0.9% infusion.   Pertinent  Medical History  HTN, CHF systolic and diastolic (Jan 2025 Echo: EF 30%, G3DD, RVSP 50.3 mmHg), DM-2, polysubstance/cocaine abuse  Significant Hospital Events: Including procedures, antibiotic start and stop dates in addition to other pertinent events   01/03/2024: ED eval 9/22: PCCM called for admission   Interim History / Subjective:  No new issues. Patient is sleeping this am. EVD still draining around 9 cc/hr. EVD @ 15 cm.  Objective    Blood pressure (!) 156/109, pulse 90, temperature 100 F (37.8 C), temperature source Axillary, resp. rate (!) 31, height 5' 5 (1.651 m), weight 55.6 kg, last menstrual period 12/24/2015, SpO2 98%.        Intake/Output Summary (Last 24 hours) at 01/11/2024 0928 Last data filed at 01/11/2024 0908 Gross per 24 hour  Intake 900 ml  Output 140 ml  Net 760 ml   Filed Weights   01/09/24 0500 01/10/24 0500 01/11/24 0500  Weight: 57.2 kg 59.6 kg 55.6 kg   No acute events overnight, weaned off Precedex , now on Seroquel . EVD at 10 SBP goal<160 Has a cortrak, tube feeds at goal   Examination: General: ill apearing, sleeping  but wakes up. 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 Repeat CT ordered -SLP reevaluated, NPO when poor mentation and can have a diet when awake as she had no evidence of aspiration  - Continue EVD per neurosurg - off Precedex , now on Seroquel . - SBP goal now <160 - Continue PTA GDMT - She will need a repeat CT chest in 3 months for her right apical lesion    Dispo: EVD per neurosurgery  35 minutes of critical care time spent caring for this patient excluding procedures.  Tamela Stakes, MD  Attending Physician, Critical Care Medicine [facility] See Amion for pager If no response to pager, please call [phone] until 7pm After 7pm, Please call E-link [phone]

## 2024-01-11 NOTE — Plan of Care (Signed)
 Patient has been having low-grade fevers.  Could be central fevers. Will check chest x-ray urinalysis procalcitonin blood cultures and monitor white cell count. Hold off on antibiotics for now.  Lesa Vandall A Maddax Palinkas,MBBS Fellow,CCM

## 2024-01-11 NOTE — Progress Notes (Signed)
 Physical Therapy Treatment Patient Details Name: Amanda [name] MRN: [id number] DOB: 26-May-1960 Today's Date: 01/11/2024   History of Present Illness 63 yo female presents to [facility] on 9/22 with lethargy and near-syncope s/p cocaine and opioid ingestion. 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 S/p R frontal ventriculostomy, burr holes 9/22. ETT 9/22-9/23. PMHx of cocaine abuse, CHF, CKD, chronic cough, HTN and DM2.    PT Comments  Pt more conversive and responsive today vs previous PT session. Pt also requiring much less physical assist, overall requiring min +2 assist for transfer-level mobility. Pt tolerating repeated transfers into standing, but standing tolerance <10 seconds at a time so unable to progress to gait this date. VSS Throughout, plan remains appropriate.     If plan is discharge home, recommend the following: Direct supervision/assist for medications management;Direct supervision/assist for financial management;Assist for transportation;Help with stairs or ramp for entrance;Supervision due to cognitive status;A lot of help with walking and/or transfers;A lot of help with bathing/dressing/bathroom   Can travel by private vehicle        Equipment Recommendations  None recommended by PT    Recommendations for Other Services       Precautions / Restrictions Precautions Precautions: Fall;Other (comment) Recall of Precautions/Restrictions: Impaired Precaution/Restrictions Comments: EVD (have RN clamp prior to mobility), SBP < 160, R mitt, posey, a line Restrictions Weight Bearing Restrictions Per Provider Order: No     Mobility  Bed Mobility Overal bed mobility: Needs Assistance Bed Mobility: Supine to Sit     Supine to sit: Mod assist     General bed mobility comments: assist for trunk rise and LE progression over EOB, boost towards EOB with bed pad.     Transfers Overall transfer level: Needs assistance Equipment used: 2 person hand held assist Transfers: Sit to/from Stand, Bed to chair/wheelchair/BSC Sit to Stand: Min assist, +2 physical assistance   Step pivot transfers: Mod assist, +2 physical assistance, +2 safety/equipment       General transfer comment: assist for power up, rise, steady, and pivot OOB to recliner. repeated stands x3 from EOB and recliner.    Ambulation/Gait               General Gait Details: nt   Comptroller Bed    Modified Rankin (Stroke Patients Only) Modified Rankin (Stroke Patients Only) Pre-Morbid Rankin Score: No symptoms Modified Rankin: Moderately severe disability     Balance Overall balance assessment: Needs assistance Sitting-balance support: Feet supported Sitting balance-Leahy Scale: Fair     Standing balance support: Bilateral upper extremity supported Standing balance-Leahy Scale: Poor                 High Level Balance Comments: standing marches x3 bilat before fatiguing            Communication Communication Communication: Impaired Factors Affecting Communication: Reduced clarity of speech  Cognition Arousal: Lethargic Behavior During Therapy: Flat affect   PT - Cognitive impairments: Awareness, Attention, Difficult to assess, Safety/Judgement, Sequencing, Orientation                       PT - Cognition Comments: pt more responsive and vocal today vs previous PT session, following one-step commands with max multimodal cuing consistently Following commands: Impaired Following commands impaired: Follows one step commands with increased time  Cueing Cueing Techniques: Verbal cues, Gestural cues, Tactile cues, Visual cues  Exercises      General Comments General comments (skin integrity, edema, etc.): SBP 140s during session      Pertinent Vitals/Pain Pain Assessment Pain Assessment:  Faces Faces Pain Scale: Hurts a little bit Pain Location: R hand to touch Pain Descriptors / Indicators: Tender, Aching Pain Intervention(s): Limited activity within patient's tolerance, Monitored during session, Repositioned    Home Living                          Prior Function            PT Goals (current goals can now be found in the care plan section) Acute Rehab PT Goals Patient Stated Goal: unable PT Goal Formulation: With patient Time For Goal Achievement: 01/19/24 Potential to Achieve Goals: Fair Progress towards PT goals: Progressing toward goals    Frequency    Min 3X/week      PT Plan      Co-evaluation PT/OT/SLP Co-Evaluation/Treatment: Yes Reason for Co-Treatment: Complexity of the patient's impairments (multi-system involvement);Necessary to address cognition/behavior during functional activity;For patient/therapist safety;To address functional/ADL transfers PT goals addressed during session: Mobility/safety with mobility;Balance OT goals addressed during session: ADL's and self-care;Strengthening/ROM      AM-PAC PT 6 Clicks Mobility   Outcome Measure  Help needed turning from your back to your side while in a flat bed without using bedrails?: A Lot Help needed moving from lying on your back to sitting on the side of a flat bed without using bedrails?: A Lot Help needed moving to and from a bed to a chair (including a wheelchair)?: A Lot Help needed standing up from a chair using your arms (e.g., wheelchair or bedside chair)?: A Lot Help needed to walk in hospital room?: Total Help needed climbing 3-5 steps with a railing? : Total 6 Click Score: 10    End of Session Equipment Utilized During Treatment: Gait belt Activity Tolerance: Patient tolerated treatment well;Patient limited by fatigue Patient left: with call bell/phone within reach;with nursing/sitter in room;in chair;with chair alarm set;with restraints reapplied (posey alarm  belt) Nurse Communication: Mobility status PT Visit Diagnosis: Unsteadiness on feet (R26.81);Difficulty in walking, not elsewhere classified (R26.2)     Time: 8784-8757 PT Time Calculation (min) (ACUTE ONLY): 27 min  Charges:    $Therapeutic Activity: 8-22 mins PT General Charges $$ ACUTE PT VISIT: 1 Visit                     Johana RAMAN, PT DPT Acute Rehabilitation Services Secure Chat Preferred  Office [phone]    Alessio Bogan E Stroup 01/11/2024, 4:05 PM

## 2024-01-12 DIAGNOSIS — I5040 Unspecified combined systolic (congestive) and diastolic (congestive) heart failure: Secondary | ICD-10-CM | POA: Diagnosis not present

## 2024-01-12 DIAGNOSIS — I610 Nontraumatic intracerebral hemorrhage in hemisphere, subcortical: Secondary | ICD-10-CM | POA: Diagnosis not present

## 2024-01-12 DIAGNOSIS — G919 Hydrocephalus, unspecified: Secondary | ICD-10-CM | POA: Diagnosis not present

## 2024-01-12 DIAGNOSIS — E119 Type 2 diabetes mellitus without complications: Secondary | ICD-10-CM | POA: Diagnosis not present

## 2024-01-12 LAB — GLUCOSE, CAPILLARY
Glucose-Capillary: 103 mg/dL — ABNORMAL HIGH (ref 70–99)
Glucose-Capillary: 116 mg/dL — ABNORMAL HIGH (ref 70–99)
Glucose-Capillary: 144 mg/dL — ABNORMAL HIGH (ref 70–99)
Glucose-Capillary: 152 mg/dL — ABNORMAL HIGH (ref 70–99)

## 2024-01-12 NOTE — Progress Notes (Signed)
    Providing Compassionate, Quality Care - Together   NEUROSURGERY PROGRESS NOTE     S: No issues overnight.    O: EXAM:  BP 136/77   Pulse 78   Temp 99.1 F (37.3 C) (Axillary)   Resp (!) 28   Ht 5' 5 (1.651 m)   Wt 55.9 kg   LMP 12/24/2015 (Within Days)   SpO2 96%   BMI 20.51 kg/m    Awake, alert.  Speech fluent FC RUE, BLE Minimal movement LUE  ASSESSMENT:  63 y.o. with  IPH and IVH, s/p EVD tolerating at 20cm H2O     PLAN: -Clamp EVD -NPO/tube m/n -CTH in the AM -Likely to OR tmrw for shunt if pt fails clamp trial -Call w/ questions/concerns.   Camie Pickle, [facility]

## 2024-01-12 NOTE — Progress Notes (Signed)
 Physical Therapy Treatment Patient Details Name: Amanda [name] MRN: [id number] DOB: 09-Jul-1960 Today's Date: 01/12/2024   History of Present Illness 63 yo female presents to [facility] on 9/22 with lethargy and near-syncope s/p cocaine and opioid ingestion. 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 S/p R frontal ventriculostomy, burr holes 9/22. ETT 9/22-9/23. PMHx of cocaine abuse, CHF, CKD, chronic cough, HTN and DM2.    PT Comments  The pt was not very talkative today. She displays deficits in comprehension vs motor planning as she often either resisted or did not actively move when cued. Per discussion with RN, some may be behavioral, but there are concerns for comprehension vs motor planning deficits as pt asked to me? when therapist cued pt to move her legs towards therapist to sit up EOB and then still did not initiate. Due to poor command following (followed multi-modal simple single step commands inconsistently, <50% of the time) today, the pt required maxA for bed mobility and modA for repeated sit <> stand transfers and step pivot transfers with HHA. Unable to advance gait safely this date due to poor command following and pt sitting prematurely, displaying poor safety awareness. Will continue to follow acutely.    If plan is discharge home, recommend the following: Direct supervision/assist for medications management;Direct supervision/assist for financial management;Assist for transportation;Help with stairs or ramp for entrance;Supervision due to cognitive status;A lot of help with walking and/or transfers;A lot of help with bathing/dressing/bathroom;Assistance with cooking/housework   Can travel by private vehicle        Equipment Recommendations  Hospital bed;BSC/3in1;Wheelchair (measurements PT);Wheelchair cushion (measurements PT);Rolling walker (2 wheels) (pending progress)     Recommendations for Other Services       Precautions / Restrictions Precautions Precautions: Fall;Other (comment) Recall of Precautions/Restrictions: Impaired Precaution/Restrictions Comments: EVD (have RN clamp prior to mobility), SBP < 160, bil mitt, posey, cortrak Restrictions Weight Bearing Restrictions Per Provider Order: No     Mobility  Bed Mobility Overal bed mobility: Needs Assistance Bed Mobility: Supine to Sit     Supine to sit: Max assist, HOB elevated     General bed mobility comments: Pt had a lot of difficulty comprehending and initiating movement to assist with bed mobility, not seeming to understand cues to bring legs off EOB as she often resisted and asked to me? when cued to bring legs to therapist. Pt did grab therapist's hand to pull trunk up to sit though, maxA.    Transfers Overall transfer level: Needs assistance Equipment used: 1 person hand held assist Transfers: Sit to/from Stand, Bed to chair/wheelchair/BSC Sit to Stand: Mod assist   Step pivot transfers: Mod assist       General transfer comment: Pt needed multi-modal cues to initiate transferring to stand, x2 reps from EOB and x1 rep from recliner. ModA needed to power up to stand and gain balance with pt often maintaining knees and hips flexed despite cues to correct. modA needed for balance and shifting weight to advance steps to step pivot to R from bed to recliner with HHA    Ambulation/Gait Ambulation/Gait assistance: Mod assist Gait Distance (Feet): 2 Feet Assistive device: 1 person hand held assist Gait Pattern/deviations: Step-to pattern, Decreased step length - right, Decreased step length - left, Decreased stride length, Decreased weight shift to right, Decreased weight shift to left, Knee flexed in stance - right, Knee flexed in stance - left, Trunk flexed Gait velocity: reduced Gait velocity interpretation: <1.31  ft/sec, indicative of household ambulator   General Gait  Details: Pt maintained a flexed trunk and legs when standing, needing max cues to shift weight to advance steps to step pivot to R with HHA. ModA needed for balance and to progress steps. Attempted to have pt work on marching in place to advance gait after transfer to chair but pt not following cues well upon repeated standing bout and sat herself prematurely in chair.   Stairs             Wheelchair Mobility     Tilt Bed    Modified Rankin (Stroke Patients Only) Modified Rankin (Stroke Patients Only) Pre-Morbid Rankin Score: No symptoms Modified Rankin: Moderately severe disability     Balance Overall balance assessment: Needs assistance Sitting-balance support: Feet supported Sitting balance-Leahy Scale: Fair Sitting balance - Comments: Pt leans to the L, needing min-modA for static sitting balance Postural control: Left lateral lean Standing balance support: Bilateral upper extremity supported, During functional activity Standing balance-Leahy Scale: Poor Standing balance comment: reliant on UE support and modA                            Communication Communication Communication: Impaired Factors Affecting Communication: Reduced clarity of speech;Difficulty expressing self  Cognition Arousal: Alert Behavior During Therapy: Flat affect   PT - Cognitive impairments: Awareness, Attention, Difficult to assess, Safety/Judgement, Sequencing                       PT - Cognition Comments: Pt not answering orientation questions and not very vocal today. Pt did get agitated briefly and swat PT's thigh but then quickly calmed down and stopped. Noted poor motor planning, despite max multi-modal cues repeated often. Pt asking to me? when cued to move leg towards therapist, then pt still unable to initiate correct movement on her own. Following commands: Impaired Following commands impaired: Follows one step commands inconsistently, Follows one step commands  with increased time    Cueing Cueing Techniques: Verbal cues, Tactile cues, Visual cues, Gestural cues  Exercises      General Comments General comments (skin integrity, edema, etc.): BP 150/99 supine start of session, 179/107 with arm moving sitting up, 168/92 (112) repeat sitting BP with arm resting, 136/107 sitting in chair after standing, 154/91 reclined end of session      Pertinent Vitals/Pain Pain Assessment Pain Assessment: Faces Faces Pain Scale: No hurt Pain Intervention(s): Monitored during session    Home Living                          Prior Function            PT Goals (current goals can now be found in the care plan section) Acute Rehab PT Goals Patient Stated Goal: to walk PT Goal Formulation: With patient Time For Goal Achievement: 01/19/24 Potential to Achieve Goals: Fair Progress towards PT goals: Progressing toward goals    Frequency    Min 3X/week      PT Plan      Co-evaluation              AM-PAC PT 6 Clicks Mobility   Outcome Measure  Help needed turning from your back to your side while in a flat bed without using bedrails?: A Lot Help needed moving from lying on your back to sitting on the side of a flat bed without using  bedrails?: A Lot Help needed moving to and from a bed to a chair (including a wheelchair)?: A Lot Help needed standing up from a chair using your arms (e.g., wheelchair or bedside chair)?: A Lot Help needed to walk in hospital room?: Total Help needed climbing 3-5 steps with a railing? : Total 6 Click Score: 10    End of Session   Activity Tolerance: Patient tolerated treatment well;Patient limited by fatigue;Other (comment) (limited by cognition) Patient left: with call bell/phone within reach;in chair;with chair alarm set;with restraints reapplied Nurse Communication: Mobility status;Other (comment) (BP, pt swatted PT, pt with poor comprehension/motor planning) PT Visit Diagnosis: Unsteadiness  on feet (R26.81);Difficulty in walking, not elsewhere classified (R26.2);Other abnormalities of gait and mobility (R26.89);Other symptoms and signs involving the nervous system (R29.898)     Time: 8780-8751 PT Time Calculation (min) (ACUTE ONLY): 29 min  Charges:    $Therapeutic Activity: 23-37 mins PT General Charges $$ ACUTE PT VISIT: 1 Visit                     Theo Ferretti, PT, DPT Acute Rehabilitation Services  Office: [phone]    Theo CHRISTELLA Ferretti 01/12/2024, 1:31 PM

## 2024-01-12 NOTE — Progress Notes (Signed)
 Nutrition Follow-up  DOCUMENTATION CODES:   Non-severe (moderate) malnutrition in context of social or environmental circumstances (polysubstance abuse)  INTERVENTION:   Continue tube feeding via Cortrak tube: Osmolite 1.5 at 40 ml/h (960 ml per day) Prosource TF20 60 ml daily  Provides 1520 kcal, 80 gm protein, 731 ml free water  daily  Continue thiamine  daily  Encourage PO intake as able    NUTRITION DIAGNOSIS:   Moderate Malnutrition related to social / environmental circumstances (polysubstance abuse) as evidenced by mild muscle depletion, moderate fat depletion, mild fat depletion, moderate muscle depletion. Ongoing.   GOAL:   Patient will meet greater than or equal to 90% of their needs Met with TF at goal   MONITOR:   TF tolerance, Vent status, Labs  REASON FOR ASSESSMENT:   Consult Enteral/tube feeding initiation and management  ASSESSMENT:   Pt with hx HTN, CHF, type 2 diabetes, polysubstance abuse, and pulmonary edema. Admitted with AMS, lethargy, and deteriorating condition requiring intubation; diagnosed ICH.  Pt discussed during ICU rounds and with RN and MD.  Per RN pt started on diet 9/25 but coughing this am, pt now NPO again.  EVD remains.  Spoke with pt who opened eyes to voice but did not respond to questions. Per RN pt not participating with care. Pt in mittens and posey.  Plan for shunt placement on 10/2.    9/23 - extubated 9/24 - s/p cortrak placement; tip gastric  9/25 - Diet advanced to Dysphagia 2/thin; TF stopped 9/26 - back down to Full liquids, TF resumed  Medications reviewed and include: colace, miralax , spironolactone  Osmolite 1.5 @ 40 60 ml ProSource TF20 daily  Labs reviewed:  CBG's: 131-156  EVD: 99 ml   Current weight: 55.9 kg Admission weight: 56.1 kg  Diet Order:   Diet Order             Diet NPO time specified  Diet effective midnight           Diet full liquid Room service appropriate? No; Fluid consistency:  Thin  Diet effective now                   EDUCATION NEEDS:   Not appropriate for education at this time  Skin:  Skin Assessment: Reviewed RN Assessment  Last BM:  9/30  Height:   Ht Readings from Last 1 Encounters:  01/03/24 5' 5 (1.651 m)    Weight:   Wt Readings from Last 1 Encounters:  01/12/24 55.9 kg    Ideal Body Weight:  56.8 kg  BMI:  Body mass index is 20.51 kg/m.  Estimated Nutritional Needs:   Kcal:  1500-1700  Protein:  80-90 grams  Fluid:  1.5-1.7L  Latrena Benegas P., RD, LDN, CNSC See AMiON for contact information

## 2024-01-12 NOTE — Progress Notes (Signed)
   NAME:  Amanda [name], MRN:  [id number], DOB:  1961-03-03, LOS: 9 ADMISSION DATE:  01/03/2024, CONSULTATION DATE:  01/12/24 REFERRING MD: Carita Senior, MD, CHIEF COMPLAINT:  AMS for one hour   History of Present Illness:  [name] with HTN, CHF (systolic and diastolic, EF 30%, G3DD, RVSP 50.3 mmHg), DM-2, and polysubstance/cocaine abuse 3 hrs before presentation, who called EMS for AMS, lethargy and near syncope. Her BP in ED 180/120, repeat BP 220/126. She was oriented to person only. She denied headache, chest pain, shortness of breath, abdominal pain, nausea and vomiting. Unable to give any meaningful history. Her condition deteriorated (unable to protect her airway and more lethargic) and got intubated. Clevidipine  was started for BP control and Propofol  for sedation. On NS 0.9% infusion.   Pertinent  Medical History  HTN, CHF systolic and diastolic (Jan 2025 Echo: EF 30%, G3DD, RVSP 50.3 mmHg), DM-2, polysubstance/cocaine abuse  Significant Hospital Events: Including procedures, antibiotic start and stop dates in addition to other pertinent events   01/03/2024: ED eval 9/22: PCCM called for admission  9/30 - EVD Clamped.  Interim History / Subjective:  No new issues. Patient is sleeping this am. EVD still draining around 9 cc/hr. EVD @ 15 cm.  Objective    Blood pressure (!) 150/94, pulse 85, temperature 98.9 F (37.2 C), temperature source Axillary, resp. rate (!) 29, height 5' 5 (1.651 m), weight 55.9 kg, last menstrual period 12/24/2015, SpO2 90%.        Intake/Output Summary (Last 24 hours) at 01/12/2024 1602 Last data filed at 01/12/2024 1300 Gross per 24 hour  Intake 847.33 ml  Output 824 ml  Net 23.33 ml   Filed Weights   01/10/24 0500 01/11/24 0500 01/12/24 0500  Weight: 59.6 kg 55.6 kg 55.9 kg   No acute events overnight, weaned off Precedex , now on Seroquel . EVD at 10 SBP goal<160 Has a cortrak, tube feeds at goal   Examination: General:  ill apearing, sleeping but wakes up. 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 Repeat CT ordered -SLP reevaluated, NPO when poor mentation and can have a diet when awake as she had no evidence of aspiration  - Continue EVD per neurosurg - off Precedex , now on Seroquel . - SBP goal now <160 - Continue PTA GDMT - EVD is clamped.  Neurosurgery is considering shunt if clamp trial fails -CT head in the morning pending - She will need a repeat CT chest in 3 months for her right apical lesion    Dispo: EVD per neurosurgery  35 minutes of critical care time spent caring for this patient excluding procedures.  Tamela Stakes, MD  Attending Physician, Critical Care Medicine [facility] See Amion for pager If no response to pager, please call [phone] until 7pm After 7pm, Please call E-link [phone]

## 2024-01-12 NOTE — Progress Notes (Signed)
 Drain clamped per Neurosurgery PA

## 2024-01-12 NOTE — Progress Notes (Signed)
   Inpatient Rehabilitation Admissions Coordinator   Met briefly with patient at bedside. I will follow up with her daughter, [name], who I spoke with on 9/25 to clarify caregivers supports at home.  Heron Leavell, RN, MSN Rehab Admissions Coordinator [phone] 01/12/2024 3:26 PM

## 2024-01-12 NOTE — Plan of Care (Signed)
  Problem: Safety: Goal: Non-violent Restraint(s) Outcome: Progressing   Problem: Fluid Volume: Goal: Ability to maintain a balanced intake and output will improve Outcome: Progressing   Problem: Nutritional: Goal: Maintenance of adequate nutrition will improve Outcome: Progressing   Problem: Skin Integrity: Goal: Risk for impaired skin integrity will decrease Outcome: Progressing   Problem: Tissue Perfusion: Goal: Adequacy of tissue perfusion will improve Outcome: Progressing   Problem: Education: Goal: Knowledge of disease or condition will improve Outcome: Progressing Goal: Knowledge of secondary prevention will improve (MUST DOCUMENT ALL) Outcome: Progressing Goal: Knowledge of patient specific risk factors will improve (DELETE if not current risk factor) Outcome: Progressing   Problem: Intracerebral Hemorrhage Tissue Perfusion: Goal: Complications of Intracerebral Hemorrhage will be minimized Outcome: Progressing   Problem: Nutrition: Goal: Risk of aspiration will decrease Outcome: Progressing Goal: Dietary intake will improve Outcome: Progressing

## 2024-01-13 ENCOUNTER — Inpatient Hospital Stay (HOSPITAL_COMMUNITY): Payer: Self-pay

## 2024-01-13 ENCOUNTER — Inpatient Hospital Stay (HOSPITAL_COMMUNITY)

## 2024-01-13 ENCOUNTER — Encounter (HOSPITAL_COMMUNITY)
Admission: EM | Disposition: A | Discharge status: Home / Self Care | Payer: Self-pay | Source: Home / Self Care | Attending: Family Medicine

## 2024-01-13 DIAGNOSIS — I610 Nontraumatic intracerebral hemorrhage in hemisphere, subcortical: Secondary | ICD-10-CM | POA: Diagnosis not present

## 2024-01-13 DIAGNOSIS — I5040 Unspecified combined systolic (congestive) and diastolic (congestive) heart failure: Secondary | ICD-10-CM | POA: Diagnosis not present

## 2024-01-13 DIAGNOSIS — G919 Hydrocephalus, unspecified: Secondary | ICD-10-CM | POA: Diagnosis not present

## 2024-01-13 DIAGNOSIS — E119 Type 2 diabetes mellitus without complications: Secondary | ICD-10-CM | POA: Diagnosis not present

## 2024-01-13 LAB — GLUCOSE, CAPILLARY
Glucose-Capillary: 106 mg/dL — ABNORMAL HIGH (ref 70–99)
Glucose-Capillary: 117 mg/dL — ABNORMAL HIGH (ref 70–99)
Glucose-Capillary: 122 mg/dL — ABNORMAL HIGH (ref 70–99)
Glucose-Capillary: 129 mg/dL — ABNORMAL HIGH (ref 70–99)
Glucose-Capillary: 78 mg/dL (ref 70–99)
Glucose-Capillary: 94 mg/dL (ref 70–99)

## 2024-01-13 SURGERY — SHUNT INSERTION VENTRICULAR-PERITONEAL
Anesthesia: General | Laterality: Left

## 2024-01-13 NOTE — Progress Notes (Signed)
    Providing Compassionate, Quality Care - Together   NEUROSURGERY PROGRESS NOTE     S: Brief period of decreased responsiveness early this AM. Returned to baseline shortly after. CTH this AM without acute change.    O: EXAM:  BP (!) 140/94   Pulse 85   Temp 98.4 F (36.9 C) (Axillary)   Resp (!) 30   Ht 5' 5 (1.651 m)   Wt 55.9 kg   LMP 12/24/2015 (Within Days)   SpO2 96%   BMI 20.51 kg/m     Awake, alert.  Speech fluent FC RUE, BLE Minimal movement LUE   ASSESSMENT:  63 y.o. with IPH and IVH, s/p EVD tolerating clamp trial well     PLAN: -Anticipate EVD removal tmrw -Call w/ questions/concerns.   Camie Pickle, [facility]

## 2024-01-13 NOTE — TOC Progression Note (Signed)
 Transition of Care [facility]) - Progression Note    Patient Details  Name: Amanda [name] MRN: [id number] Date of Birth: October 20, 1960  Transition of Care [facility]) CM/SW Contact  Inocente GORMAN Kindle, LCSW Phone Number: 01/13/2024, 8:49 AM  Clinical Narrative:    CSW continuing to follow for needs.    Expected Discharge Plan: Skilled Nursing Facility Barriers to Discharge: Continued Medical Work up, English as a second language teacher, SNF Pending bed offer               Expected Discharge Plan and Services In-house Referral: Clinical Social Work   Post Acute Care Choice: Skilled Nursing Facility Living arrangements for the past 2 months: Single Family Home                                       Social Drivers of Health (SDOH) Interventions SDOH Screenings   Food Insecurity: No Food Insecurity (07/16/2023)   Received from [facility]  Housing: Low Risk  (07/16/2023)   Received from [facility]  Transportation Needs: No Transportation Needs (07/16/2023)   Received from [facility]  Utilities: Not At Risk (07/16/2023)   Received from [facility]  Alcohol Screen: Low Risk  (05/14/2023)  Financial Resource Strain: Low Risk  (07/16/2023)   Received from [facility]  Social Connections: Unknown (10/19/2022)   Received from [facility]  Tobacco Use: High Risk (01/03/2024)    Readmission Risk Interventions    03/26/2022   10:50 AM  Readmission Risk Prevention Plan  Transportation Screening Complete  HRI or Home Care Consult Complete  Social Work Consult for Recovery Care Planning/Counseling Complete  Palliative Care Screening Not Applicable  Medication Review Oceanographer) Referral to Pharmacy

## 2024-01-13 NOTE — Plan of Care (Signed)
  Problem: Safety: Goal: Non-violent Restraint(s) Outcome: Progressing   Problem: Education: Goal: Knowledge of General Education information will improve Description: Including pain rating scale, medication(s)/side effects and non-pharmacologic comfort measures Outcome: Progressing   Problem: Clinical Measurements: Goal: Will remain free from infection Outcome: Progressing Goal: Diagnostic test results will improve Outcome: Progressing Goal: Respiratory complications will improve Outcome: Progressing   Problem: Activity: Goal: Risk for activity intolerance will decrease Outcome: Progressing   Problem: Nutrition: Goal: Adequate nutrition will be maintained Outcome: Progressing   Problem: Intracerebral Hemorrhage Tissue Perfusion: Goal: Complications of Intracerebral Hemorrhage will be minimized Outcome: Progressing   Problem: Coping: Goal: Will verbalize positive feelings about self Outcome: Progressing Goal: Will identify appropriate support needs Outcome: Progressing   Problem: Health Behavior/Discharge Planning: Goal: Ability to manage health-related needs will improve Outcome: Progressing Goal: Goals will be collaboratively established with patient/family Outcome: Progressing   Problem: Nutrition: Goal: Risk of aspiration will decrease Outcome: Progressing

## 2024-01-13 NOTE — Progress Notes (Signed)
 Physical Therapy Treatment Patient Details Name: Amanda [name] MRN: [id number] DOB: [birth year] Today's Date: 01/13/2024   History of Present Illness 63 yo female presents to [facility] on 9/22 with lethargy and near-syncope s/p cocaine and opioid ingestion. 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 S/p R frontal ventriculostomy, burr holes 9/22. ETT 9/22-9/23. PMHx of cocaine abuse, CHF, CKD, chronic cough, HTN and DM2.    PT Comments  The pt was more lethargic this session, keeping her eyes closed often and needing increased assistance for all functional mobility. While keeping her eyes closed often, she would intermittently verbally respond. She seemed to be more verbal than yesterday, often yelling What?! when her name was repeatedly called to gain her attention. She was unable to maintain attention long though, resulting in poor command following and initiation. Pt actively engaged once provided maxA to initiate all movements. Per report from family to nursing, some of this appears to be behavioral but it does appear like she does not comprehend the cues and has poor motor planning capabilities at this time. Will continue to follow acutely. At this time, she does not appear like she will be able to tolerate the intensity of AIR, but if her ability to participate improves then it would be worth considering AIR again. At this time, short-term inpatient rehab at a less intense level, < 3 hours/day, does appear to be more appropriate.    If plan is discharge home, recommend the following: Direct supervision/assist for medications management;Direct supervision/assist for financial management;Assist for transportation;Help with stairs or ramp for entrance;Supervision due to cognitive status;A lot of help with bathing/dressing/bathroom;Assistance with cooking/housework;Two people to help with walking and/or transfers    Can travel by private vehicle     No  Equipment Recommendations  Hospital bed;BSC/3in1;Wheelchair (measurements PT);Wheelchair cushion (measurements PT);Rolling walker (2 wheels);Hoyer lift (pending progress)    Recommendations for Other Services       Precautions / Restrictions Precautions Precautions: Fall;Other (comment) Recall of Precautions/Restrictions: Impaired Precaution/Restrictions Comments: EVD (have RN clamp prior to mobility), SBP < 160, bil mitt, posey, cortrak Restrictions Weight Bearing Restrictions Per Provider Order: No     Mobility  Bed Mobility Overal bed mobility: Needs Assistance Bed Mobility: Supine to Sit     Supine to sit: Max assist, HOB elevated     General bed mobility comments: Pt often keeping eyes closed and yelling What?! when name called followed by cues to sit up. Pt not initiating with VCs alone, needing maxA to initiate and sit up with pt actively engaging mid transition.    Transfers Overall transfer level: Needs assistance Equipment used: 1 person hand held assist Transfers: Sit to/from Stand, Bed to chair/wheelchair/BSC Sit to Stand: Max assist Stand pivot transfers: Max assist         General transfer comment: Pt with less trunk control today, often leaning posteriorly and to the L when sitting and needing maxA to shift weight anteriorly and midline pre and during transfer. When maxA was provided to begin transfer to stand then pt would actively engage her legs to stand, maintaining a flexed trunk and knees though. Pt stood 1x from EOB and 5x from recliner with maxA and bil knee block. Pt not following commands to take steps today, needing maxA to stand pivot to R from bed to recliner.    Ambulation/Gait             Pre-gait activities: Lateral weight shifting in standing with  maxA, bil knee block, and max cues and assistance to shift weight bil General Gait Details: Pt not following cues to take steps  today   Stairs             Wheelchair Mobility     Tilt Bed    Modified Rankin (Stroke Patients Only) Modified Rankin (Stroke Patients Only) Pre-Morbid Rankin Score: No symptoms Modified Rankin: Severe disability     Balance Overall balance assessment: Needs assistance Sitting-balance support: Feet supported, Single extremity supported, No upper extremity supported Sitting balance-Leahy Scale: Poor Sitting balance - Comments: Pt leaning to the L and posteriorly with poor and delayed reactional strategies, needing mod-maxA for static sitting balance at EOB Postural control: Left lateral lean, Posterior lean Standing balance support: Bilateral upper extremity supported, During functional activity Standing balance-Leahy Scale: Poor Standing balance comment: reliant on UE support and mod-maxA to stand, pt maintaining knees flexed and trunk flexed                            Communication Communication Communication: Impaired Factors Affecting Communication: Reduced clarity of speech;Difficulty expressing self  Cognition Arousal: Lethargic Behavior During Therapy: Flat affect   PT - Cognitive impairments: Awareness, Attention, Difficult to assess, Safety/Judgement, Sequencing, Initiation, Problem solving                       PT - Cognition Comments: While lethargic and not opening eyes much, pt a little more vocal today, often getting irritated with the repeated cues/questions/calling her name. Pt not able to maintain attention long though. Poor initiation and only following multi-modal simple cues occasionally. Poor awareness of her deficits to maintain her safety. Pt answered yes to her name but also to an incorrect name, unable to assess orientation further as pt often did not answer questions. Following commands: Impaired Following commands impaired: Follows one step commands inconsistently, Follows one step commands with increased time    Cueing  Cueing Techniques: Verbal cues, Tactile cues, Visual cues, Gestural cues  Exercises      General Comments General comments (skin integrity, edema, etc.): VSS on RA      Pertinent Vitals/Pain Pain Assessment Pain Assessment: Faces Faces Pain Scale: Hurts a little bit Pain Location: generalized with mobility Pain Descriptors / Indicators: Discomfort, Grimacing, Guarding Pain Intervention(s): Limited activity within patient's tolerance, Monitored during session, Repositioned    Home Living                          Prior Function            PT Goals (current goals can now be found in the care plan section) Acute Rehab PT Goals Patient Stated Goal: to walk PT Goal Formulation: With patient Time For Goal Achievement: 01/19/24 Potential to Achieve Goals: Fair Progress towards PT goals: Not progressing toward goals - comment (lethargy resulting in functional decline)    Frequency    Min 3X/week      PT Plan      Co-evaluation              AM-PAC PT 6 Clicks Mobility   Outcome Measure  Help needed turning from your back to your side while in a flat bed without using bedrails?: A Lot Help needed moving from lying on your back to sitting on the side of a flat bed without using bedrails?: A Lot Help needed moving to  and from a bed to a chair (including a wheelchair)?: A Lot Help needed standing up from a chair using your arms (e.g., wheelchair or bedside chair)?: A Lot Help needed to walk in hospital room?: Total Help needed climbing 3-5 steps with a railing? : Total 6 Click Score: 10    End of Session   Activity Tolerance: Other (comment);Patient limited by lethargy (limited by cognition) Patient left: with call bell/phone within reach;in chair;with chair alarm set;with restraints reapplied Nurse Communication: Mobility status;Other (comment) (pt with poor comprehension/motor planning) PT Visit Diagnosis: Unsteadiness on feet (R26.81);Difficulty in  walking, not elsewhere classified (R26.2);Other abnormalities of gait and mobility (R26.89);Other symptoms and signs involving the nervous system (R29.898)     Time: 8841-8774 PT Time Calculation (min) (ACUTE ONLY): 27 min  Charges:    $Therapeutic Activity: 23-37 mins PT General Charges $$ ACUTE PT VISIT: 1 Visit                     Theo Ferretti, PT, DPT Acute Rehabilitation Services  Office: [phone]    Theo CHRISTELLA Ferretti 01/13/2024, 1:48 PM

## 2024-01-13 NOTE — Progress Notes (Signed)
 RN spoke with Dr Debby, plans to cancel procedure for VP shunt at this time as CT is looking better. NPO order discontinued, previous diet reordered and TF restarted.

## 2024-01-13 NOTE — Progress Notes (Signed)
   Inpatient Rehabilitation Admissions Coordinator   Patient not at a level to pursue CIR rehab. Recommend other rehab venues to be pursued. TOC aware. We will sign off.  Heron Leavell, RN, MSN Rehab Admissions Coordinator [phone] 01/13/2024 11:52 AM

## 2024-01-13 NOTE — Progress Notes (Signed)
 Speech Language Pathology Treatment: Dysphagia;Cognitive-Linguistic  Patient Details Name: Amanda [name] MRN: [id number] DOB: 10-24-1960 Today's Date: 01/13/2024 Time: 8974-8965 SLP Time Calculation (min) (ACUTE ONLY): 9 min  Assessment / Plan / Recommendation Clinical Impression  Pt remains only briefly alert and attentive to PO. RN confirms that intake is negligible. Prior efforts to offer upgraded more appetizing textures were not tolerated during waxing and waning arousal, which persists. Pt willing to wake up briefly for sip of water  today, which she took with free hand and fed herself. Pt laid back down immediately after and did cough. She was unwilling to interact with me in any other way. Did not respond to my offers or questions. Continue full liquids when alert.    HPI HPI: [name] with polysubstance/cocaine abuse 3 hrs before presentation, who called EMS for AMS, lethargy and near syncope. Her condition deteriorated (unable to protect her airway and more lethargic) and got intubated. Clevidipine  was started for BP control and Propofol  for sedation. On NS 0.9% infusion.   9/22.: Emergent Burr holes and right frontal ventriculostomy placed by neurosurgery due to neurological worsening   Intubated 9/22-9/24. HTN, CHF (systolic and diastolic, EF 30%, G3DD, RVSP 50.3 mmHg), DM-2, and      SLP Plan  Continue with current plan of care          Recommendations  Diet recommendations: Thin liquid Liquids provided via: Straw Medication Administration: Whole meds with liquid Supervision: Full supervision/cueing for compensatory strategies Compensations: Slow rate;Small sips/bites Postural Changes and/or Swallow Maneuvers: Seated upright 90 degrees                              Continue with current plan of care     Yvett Rossel, Consuelo Fitch  01/13/2024, 12:48 PM

## 2024-01-13 NOTE — Progress Notes (Signed)
 Upon 0400 NIH & neuro assessment, this patient required a substantial increase in stimulation to participate in formal assessments. Pupils were noted to be disproportionate (R:3, L:5) and this RN called down to CT to expedite the patient's 0600 CT scan. NeuroSx provider Jennetta made aware of neuro change and updated scan time by 4NICU charge nurse while this RN took patient to CT. 0500 NIH is improved and pupils are noted as equal, continuing to monitor neuro status closely.

## 2024-01-13 NOTE — Progress Notes (Signed)
 NAME:  Amanda [name], MRN:  [id number], DOB:  [birth year], LOS: 10 ADMISSION DATE:  01/03/2024, CONSULTATION DATE:  01/13/24 REFERRING MD: Carita Senior, MD, CHIEF COMPLAINT:  AMS for one hour   History of Present Illness:  [name] with HTN, CHF (systolic and diastolic, EF 30%, G3DD, RVSP 50.3 mmHg), DM-2, and polysubstance/cocaine abuse 3 hrs before presentation, who called EMS for AMS, lethargy and near syncope. Her BP in ED 180/120, repeat BP 220/126. She was oriented to person only. She denied headache, chest pain, shortness of breath, abdominal pain, nausea and vomiting. Unable to give any meaningful history. Her condition deteriorated (unable to protect her airway and more lethargic) and got intubated. Clevidipine  was started for BP control and Propofol  for sedation. On NS 0.9% infusion.   Pertinent  Medical History  HTN, CHF systolic and diastolic (Jan 2025 Echo: EF 30%, G3DD, RVSP 50.3 mmHg), DM-2, polysubstance/cocaine abuse  Significant Hospital Events: Including procedures, antibiotic start and stop dates in addition to other pertinent events   01/03/2024: ED eval 9/22: PCCM called for admission  10/1 - EVD Clamped. 10/2 - Tolerated EVD Clamping. Plans for EVD removal on 10/3 CT head this morning no acute changes after EVD clamping.  Interim History / Subjective:  Patient tolerated EVD clamping.  No acute changes.  CT head this morning did not show any worsening.  Plans for EVD removal tomorrow.  Otherwise no new issues.  Objective    Blood pressure (!) 140/94, pulse 85, temperature 98.4 F (36.9 C), temperature source Axillary, resp. rate (!) 30, height 5' 5 (1.651 m), weight 55.9 kg, last menstrual period 12/24/2015, SpO2 96%.        Intake/Output Summary (Last 24 hours) at 01/13/2024 1324 Last data filed at 01/13/2024 1100 Gross per 24 hour  Intake 680 ml  Output 1000 ml  Net -320 ml   Filed Weights   01/10/24 0500 01/11/24 0500 01/12/24 0500   Weight: 59.6 kg 55.6 kg 55.9 kg   No acute events overnight, weaned off Precedex , now on Seroquel . EVD at 10 SBP goal<160 Has a cortrak, tube feeds at goal   Examination: General: ill apearing, sleeping but wakes up. 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 - SBP goal now <160 - Continue PTA GDMT - EVD is clamped.  Neurosurgery is considering EVD removal tomorrow. -CT head in the morning interval improvement of right thalamic left basal ganglia and intraventricular hemorrhages. - She will need a repeat CT chest in 3 months for her right apical lesion    Dispo: EVD per neurosurgery  35 minutes of critical care time spent caring for this patient excluding procedures.  Tamela Stakes, MD  Attending Physician, Critical Care Medicine [place] [facility] See Amion for pager If no response to pager,  please call [phone] until 7pm After 7pm, Please call E-link [phone]

## 2024-01-14 DIAGNOSIS — I10 Essential (primary) hypertension: Secondary | ICD-10-CM | POA: Diagnosis not present

## 2024-01-14 DIAGNOSIS — G919 Hydrocephalus, unspecified: Secondary | ICD-10-CM | POA: Diagnosis not present

## 2024-01-14 DIAGNOSIS — I5042 Chronic combined systolic (congestive) and diastolic (congestive) heart failure: Secondary | ICD-10-CM | POA: Diagnosis not present

## 2024-01-14 DIAGNOSIS — E871 Hypo-osmolality and hyponatremia: Secondary | ICD-10-CM

## 2024-01-14 DIAGNOSIS — I615 Nontraumatic intracerebral hemorrhage, intraventricular: Secondary | ICD-10-CM | POA: Diagnosis not present

## 2024-01-14 LAB — GLUCOSE, CAPILLARY
Glucose-Capillary: 200 mg/dL — ABNORMAL HIGH (ref 70–99)
Glucose-Capillary: 166 mg/dL — ABNORMAL HIGH (ref 70–99)
Glucose-Capillary: 183 mg/dL — ABNORMAL HIGH (ref 70–99)
Glucose-Capillary: 133 mg/dL — ABNORMAL HIGH (ref 70–99)
Glucose-Capillary: 119 mg/dL — ABNORMAL HIGH (ref 70–99)

## 2024-01-14 MED ORDER — QUETIAPINE FUMARATE 25 MG PO TABS
25.0000 mg | ORAL_TABLET | Freq: Two times a day (BID) | ORAL | Status: DC
  Administered 2024-01-14 – 2024-01-16 (×4): 25 mg
  Filled 2024-01-14 (×4): qty 1

## 2024-01-14 NOTE — Progress Notes (Signed)
   NAME:  Amanda [name], MRN:  [id number], DOB:  [birth year], LOS: 11 ADMISSION DATE:  01/03/2024, CONSULTATION DATE:  01/14/24 REFERRING MD: Carita Senior, MD, CHIEF COMPLAINT:  AMS for one hour   History of Present Illness:  [name] with HTN, CHF (systolic and diastolic, EF 30%, G3DD, RVSP 50.3 mmHg), DM-2, and polysubstance/cocaine abuse 3 hrs before presentation, who called EMS for AMS, lethargy and near syncope. Her BP in ED 180/120, repeat BP 220/126. She was oriented to person only. She denied headache, chest pain, shortness of breath, abdominal pain, nausea and vomiting. Unable to give any meaningful history. Her condition deteriorated (unable to protect her airway and more lethargic) and got intubated. Clevidipine  was started for BP control and Propofol  for sedation. On NS 0.9% infusion.   Pertinent  Medical History  HTN, CHF systolic and diastolic (Jan 2025 Echo: EF 30%, G3DD, RVSP 50.3 mmHg), DM-2, polysubstance/cocaine abuse  Significant Hospital Events: Including procedures, antibiotic start and stop dates in addition to other pertinent events   01/03/2024: ED eval 9/22: PCCM called for admission  10/1 - EVD Clamped. 10/2 - Tolerated EVD Clamping. Plans for EVD removal on 10/3 CT head this morning no acute changes after EVD clamping  Interim History / Subjective:  Patient tolerated EVD clamp trial, EVD was removed  Still spiking fever but fever spike is trending down  Objective    Blood pressure (!) 139/52, pulse 80, temperature 99.2 F (37.3 C), temperature source Oral, resp. rate (!) 32, height 5' 5 (1.651 m), weight 53.8 kg, last menstrual period 12/24/2015, SpO2 97%.        Intake/Output Summary (Last 24 hours) at 01/14/2024 1026 Last data filed at 01/14/2024 0945 Gross per 24 hour  Intake 830 ml  Output 508 ml  Net 322 ml   Filed Weights   01/11/24 0500 01/12/24 0500 01/14/24 0714  Weight: 55.6 kg 55.9 kg 53.8 kg    Examination: General:  Acute on chronically ill-appearing female, lying on the bed HEENT: [place]/AT, eyes anicteric.  moist mucus membranes Neuro: Lethargic, opens eyes with verbal stimuli, following simple commands antigravity on right side weaker on left Chest: Coarse breath sounds, no wheezes or rhonchi Heart: Regular rate and rhythm, no murmurs or gallops Abdomen: Soft, nontender, nondistended, bowel sounds present  Labs and images reviewed   Resolved problem list   Assessment and Plan  Acute right thalamic intraparenchymal hemorrhage with IVH in the setting of cocaine abuse Obstructive hydrocephalus status post EVD, now removed Hypertension Chronic combined biventricular HFrEF and HFpEF Diabetes type 2 Polysubstance abuse Right apical pleural scarring versus density Hyponatremia  Continue neuro watch Maintain SBP less than 160 Continue oral antihypertensive meds Monitor intake and output GDMT as tolerated EVD was removed Continue sliding scale insulin  with CBG goal 140-180 Watch for signs of withdrawal Counseling provided regarding quit substances Patient will need repeat CT chest in 3 months Monitor electrolytes     Valinda Novas, MD [facility] See Amion for pager If no response to pager, please call [phone] until 7pm After 7pm, Please call E-link [phone]

## 2024-01-14 NOTE — Progress Notes (Signed)
 OT Cancellation Note  Patient Details Name: Amanda [name] MRN: [id number] DOB: Apr 23, 1960   Cancelled Treatment:    Reason Eval/Treat Not Completed: Patient at procedure or test/ unavailable.  Being transported to 3W.  Continue efforts.    Juri Dinning D Liston Thum 01/14/2024, 2:09 PM 01/14/2024  RP, OTR/L  Acute Rehabilitation Services  Office:  [phone]

## 2024-01-14 NOTE — Progress Notes (Signed)
    Providing Compassionate, Quality Care - Together   NEUROSURGERY PROGRESS NOTE     S: No issues overnight.    O: EXAM:  BP (!) 139/52   Pulse 80   Temp 99.2 F (37.3 C) (Oral)   Resp (!) 32   Ht 5' 5 (1.651 m)   Wt 53.8 kg   LMP 12/24/2015 (Within Days)   SpO2 97%   BMI 19.74 kg/m     Awake, alert.  Speech fluent, answers simple questions FC RUE, BLE Minimal movement LUE   ASSESSMENT:  63 y.o. with IPH and IVH, s/p EVD tolerating clamp trial well without subsequent neurologic change.      PLAN: -EVD removed at bedside without acute complication -Continue supportive care per primary team.    Camie Pickle, [facility]

## 2024-01-14 NOTE — Progress Notes (Signed)
 Physical Therapy Treatment Patient Details Name: Amanda [name] MRN: [id number] DOB: 14-Aug-1960 Today's Date: 01/14/2024   History of Present Illness 63 yo female presents to [facility] on 9/22 with lethargy and near-syncope s/p cocaine and opioid ingestion. 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 S/p R frontal ventriculostomy, burr holes 9/22. ETT 9/22-9/23. PMHx of cocaine abuse, CHF, CKD, chronic cough, HTN and DM2.    PT Comments  Pt still lethargic, but more verbally responsive and following ~25% of cues today with delay. Pt did demonstrate some improved initiation with ascending trunk to sit up and extending legs to initiate transferring sit to stand. She displays poor motor planning and initiation to step though, needing maxA to step pivot along EOB x2 bouts this date. She tends to lean to the L when sitting EOB. Will continue to follow acutely.    If plan is discharge home, recommend the following: Direct supervision/assist for medications management;Direct supervision/assist for financial management;Assist for transportation;Help with stairs or ramp for entrance;Supervision due to cognitive status;A lot of help with bathing/dressing/bathroom;Assistance with cooking/housework;Two people to help with walking and/or transfers   Can travel by private vehicle     No  Equipment Recommendations  Hospital bed;BSC/3in1;Wheelchair (measurements PT);Wheelchair cushion (measurements PT);Rolling walker (2 wheels);Hoyer lift (pending progress)    Recommendations for Other Services       Precautions / Restrictions Precautions Precautions: Fall;Other (comment) Recall of Precautions/Restrictions: Impaired Precaution/Restrictions Comments: SBP < 160, bil mitt, posey, cortrak Restrictions Weight Bearing Restrictions Per Provider Order: No     Mobility  Bed Mobility Overal bed mobility: Needs  Assistance Bed Mobility: Supine to Sit, Sit to Supine     Supine to sit: Max assist, HOB elevated Sit to supine: Total assist   General bed mobility comments: Pt needed maxA holding onto therapist's hands to pull up to sit R EOB, some initiation noted by pt through UEs and trunk. Total assist to manage legs and trunk back to supine.    Transfers Overall transfer level: Needs assistance Equipment used: 1 person hand held assist Transfers: Sit to/from Stand, Bed to chair/wheelchair/BSC Sit to Stand: Mod assist   Step pivot transfers: Max assist       General transfer comment: Pt with better initiation through legs to transfer sit to stand from EOB 2x today, but needed heavy assistance to extend hips and repeated cues to extend trunk, modA. MaxA needed for balance, weight shifting, and advancing feet to step pivot to R along EOB towards HOB, x2 reps.    Ambulation/Gait Ambulation/Gait assistance: Max assist Gait Distance (Feet): 1 Feet (x2 bouts of ~1 ft each bout) Assistive device: 1 person hand held assist Gait Pattern/deviations: Step-to pattern, Decreased step length - right, Decreased step length - left, Decreased stride length, Decreased weight shift to right, Decreased weight shift to left, Knee flexed in stance - right, Knee flexed in stance - left, Trunk flexed Gait velocity: reduced Gait velocity interpretation: <1.31 ft/sec, indicative of household ambulator   General Gait Details: Pt maintained a flexed posture with flexed knees, often needing blocking. Poor initiation to step when cued, needing maxA to shift weight and advance contralateral leg to step pivot along EOB to R, x2 bouts.   Stairs             Wheelchair Mobility     Tilt Bed    Modified Rankin (Stroke Patients Only) Modified Rankin (Stroke Patients Only) Pre-Morbid Rankin Score: No symptoms Modified  Rankin: Severe disability     Balance Overall balance assessment: Needs  assistance Sitting-balance support: Feet supported, Single extremity supported, No upper extremity supported Sitting balance-Leahy Scale: Poor Sitting balance - Comments: Pt leaning to the L and posteriorly with poor and delayed reactional strategies, needing mod-maxA for static sitting balance at EOB with intermittent R UE support Postural control: Left lateral lean, Posterior lean Standing balance support: Bilateral upper extremity supported, During functional activity Standing balance-Leahy Scale: Poor Standing balance comment: reliant on UE support and mod-maxA to stand, pt maintaining knees flexed and trunk flexed                            Communication Communication Communication: Impaired Factors Affecting Communication: Reduced clarity of speech;Difficulty expressing self  Cognition Arousal: Lethargic Behavior During Therapy: Flat affect   PT - Cognitive impairments: Awareness, Attention, Difficult to assess, Safety/Judgement, Sequencing, Initiation, Problem solving                       PT - Cognition Comments: While lethargic and not opening eyes much, pt a little more vocal today. Did not seem irritatetd but kept flat affect. Pt not able to maintain attention long though. Poor initiation and only following multi-modal simple cues occasionally, but more than prior session (~25% of time). Poor awareness of her deficits to maintain her safety. Following commands: Impaired Following commands impaired: Follows one step commands inconsistently, Follows one step commands with increased time    Cueing Cueing Techniques: Verbal cues, Tactile cues, Visual cues, Gestural cues  Exercises      General Comments        Pertinent Vitals/Pain Pain Assessment Pain Assessment: Faces Faces Pain Scale: Hurts a little bit Pain Location: generalized with mobility Pain Descriptors / Indicators: Discomfort, Grimacing, Guarding Pain Intervention(s): Limited activity within  patient's tolerance, Monitored during session, Repositioned    Home Living                          Prior Function            PT Goals (current goals can now be found in the care plan section) Acute Rehab PT Goals Patient Stated Goal: to walk PT Goal Formulation: With patient Time For Goal Achievement: 01/19/24 Potential to Achieve Goals: Fair Progress towards PT goals: Progressing toward goals    Frequency    Min 3X/week      PT Plan      Co-evaluation              AM-PAC PT 6 Clicks Mobility   Outcome Measure  Help needed turning from your back to your side while in a flat bed without using bedrails?: A Lot Help needed moving from lying on your back to sitting on the side of a flat bed without using bedrails?: A Lot Help needed moving to and from a bed to a chair (including a wheelchair)?: A Lot Help needed standing up from a chair using your arms (e.g., wheelchair or bedside chair)?: A Lot Help needed to walk in hospital room?: Total Help needed climbing 3-5 steps with a railing? : Total 6 Click Score: 10    End of Session   Activity Tolerance: Other (comment);Patient limited by lethargy (limited by cognition) Patient left: with call bell/phone within reach;with restraints reapplied;in bed;with bed alarm set   PT Visit Diagnosis: Unsteadiness on feet (R26.81);Difficulty in walking,  not elsewhere classified (R26.2);Other abnormalities of gait and mobility (R26.89);Other symptoms and signs involving the nervous system (R29.898);Muscle weakness (generalized) (M62.81)     Time: 8281-8270 PT Time Calculation (min) (ACUTE ONLY): 11 min  Charges:    $Therapeutic Activity: 8-22 mins PT General Charges $$ ACUTE PT VISIT: 1 Visit                     Theo Ferretti, PT, DPT Acute Rehabilitation Services  Office: [phone]    Theo CHRISTELLA Ferretti 01/14/2024, 5:36 PM

## 2024-01-14 NOTE — Progress Notes (Signed)
 Received from 4N in bed accompanied by nurse.

## 2024-01-15 DIAGNOSIS — I610 Nontraumatic intracerebral hemorrhage in hemisphere, subcortical: Secondary | ICD-10-CM | POA: Diagnosis not present

## 2024-01-15 LAB — CBC WITH DIFFERENTIAL/PLATELET
Abs Immature Granulocytes: 0.04 K/uL (ref 0.00–0.07)
Basophils Absolute: 0 K/uL (ref 0.0–0.1)
Basophils Relative: 0 %
Eosinophils Absolute: 0 K/uL (ref 0.0–0.5)
Eosinophils Relative: 0 %
HCT: 39.7 % (ref 36.0–46.0)
Hemoglobin: 13.1 g/dL (ref 12.0–15.0)
Immature Granulocytes: 1 %
Lymphocytes Relative: 21 %
Lymphs Abs: 1.8 K/uL (ref 0.7–4.0)
MCH: 29 pg (ref 26.0–34.0)
MCHC: 33 g/dL (ref 30.0–36.0)
MCV: 88 fL (ref 80.0–100.0)
Monocytes Absolute: 0.9 K/uL (ref 0.1–1.0)
Monocytes Relative: 11 %
Neutro Abs: 6 K/uL (ref 1.7–7.7)
Neutrophils Relative %: 67 %
Platelets: 408 K/uL — ABNORMAL HIGH (ref 150–400)
RBC: 4.51 MIL/uL (ref 3.87–5.11)
RDW: 13.5 % (ref 11.5–15.5)
WBC: 8.9 K/uL (ref 4.0–10.5)
nRBC: 0 % (ref 0.0–0.2)

## 2024-01-15 LAB — BASIC METABOLIC PANEL WITH GFR
Anion gap: 11 (ref 5–15)
BUN: 37 mg/dL — ABNORMAL HIGH (ref 8–23)
CO2: 21 mmol/L — ABNORMAL LOW (ref 22–32)
Calcium: 9.3 mg/dL (ref 8.9–10.3)
Chloride: 103 mmol/L (ref 98–111)
Creatinine, Ser: 0.99 mg/dL (ref 0.44–1.00)
GFR, Estimated: >60 mL/min (ref >60)
Glucose, Bld: 130 mg/dL — ABNORMAL HIGH (ref 70–99)
Potassium: 4.6 mmol/L (ref 3.5–5.1)
Sodium: 135 mmol/L (ref 135–145)

## 2024-01-15 LAB — GLUCOSE, CAPILLARY
Glucose-Capillary: 101 mg/dL — ABNORMAL HIGH (ref 70–99)
Glucose-Capillary: 117 mg/dL — ABNORMAL HIGH (ref 70–99)
Glucose-Capillary: 124 mg/dL — ABNORMAL HIGH (ref 70–99)
Glucose-Capillary: 129 mg/dL — ABNORMAL HIGH (ref 70–99)
Glucose-Capillary: 134 mg/dL — ABNORMAL HIGH (ref 70–99)
Glucose-Capillary: 158 mg/dL — ABNORMAL HIGH (ref 70–99)
Glucose-Capillary: 170 mg/dL — ABNORMAL HIGH (ref 70–99)

## 2024-01-15 LAB — PHOSPHORUS: Phosphorus: 4.2 mg/dL (ref 2.5–4.6)

## 2024-01-15 LAB — MAGNESIUM: Magnesium: 2.2 mg/dL (ref 1.7–2.4)

## 2024-01-15 MED ORDER — INSULIN ASPART 100 UNIT/ML IJ SOLN
0.0000 [IU] | INTRAMUSCULAR | Status: DC
  Administered 2024-01-15: 2 [IU] via SUBCUTANEOUS
  Administered 2024-01-15 (×2): 1 [IU] via SUBCUTANEOUS
  Administered 2024-01-16 (×2): 2 [IU] via SUBCUTANEOUS
  Administered 2024-01-16: 1 [IU] via SUBCUTANEOUS
  Administered 2024-01-16 – 2024-01-17 (×5): 2 [IU] via SUBCUTANEOUS
  Administered 2024-01-17: 3 [IU] via SUBCUTANEOUS
  Administered 2024-01-17 – 2024-01-18 (×5): 2 [IU] via SUBCUTANEOUS
  Administered 2024-01-18 – 2024-01-19 (×7): 1 [IU] via SUBCUTANEOUS
  Administered 2024-01-20: 2 [IU] via SUBCUTANEOUS
  Administered 2024-01-20: 1 [IU] via SUBCUTANEOUS
  Administered 2024-01-20: 2 [IU] via SUBCUTANEOUS
  Administered 2024-01-20 (×2): 1 [IU] via SUBCUTANEOUS
  Administered 2024-01-21: 2 [IU] via SUBCUTANEOUS
  Administered 2024-01-21: 1 [IU] via SUBCUTANEOUS
  Administered 2024-01-21 – 2024-01-22 (×5): 2 [IU] via SUBCUTANEOUS
  Administered 2024-01-22: 1 [IU] via SUBCUTANEOUS
  Administered 2024-01-22: 2 [IU] via SUBCUTANEOUS
  Administered 2024-01-22: 1 [IU] via SUBCUTANEOUS
  Administered 2024-01-22: 2 [IU] via SUBCUTANEOUS
  Administered 2024-01-23: 1 [IU] via SUBCUTANEOUS
  Administered 2024-01-23: 2 [IU] via SUBCUTANEOUS
  Administered 2024-01-23: 1 [IU] via SUBCUTANEOUS
  Administered 2024-01-23 (×2): 2 [IU] via SUBCUTANEOUS
  Administered 2024-01-24 – 2024-01-25 (×5): 1 [IU] via SUBCUTANEOUS
  Administered 2024-01-25: 2 [IU] via SUBCUTANEOUS
  Administered 2024-01-26 – 2024-01-28 (×7): 1 [IU] via SUBCUTANEOUS
  Administered 2024-01-28: 2 [IU] via SUBCUTANEOUS
  Administered 2024-01-28: 1 [IU] via SUBCUTANEOUS
  Administered 2024-01-28 (×3): 2 [IU] via SUBCUTANEOUS
  Administered 2024-01-29: 1 [IU] via SUBCUTANEOUS
  Administered 2024-01-29: 2 [IU] via SUBCUTANEOUS
  Administered 2024-01-29: 1 [IU] via SUBCUTANEOUS
  Administered 2024-01-29 – 2024-01-30 (×5): 2 [IU] via SUBCUTANEOUS
  Administered 2024-01-30 – 2024-01-31 (×3): 1 [IU] via SUBCUTANEOUS
  Administered 2024-01-31: 2 [IU] via SUBCUTANEOUS
  Administered 2024-02-01: 1 [IU] via SUBCUTANEOUS
  Administered 2024-02-01: 2 [IU] via SUBCUTANEOUS
  Administered 2024-02-01 – 2024-02-05 (×13): 1 [IU] via SUBCUTANEOUS
  Administered 2024-02-06 (×2): 2 [IU] via SUBCUTANEOUS
  Administered 2024-02-07 (×3): 1 [IU] via SUBCUTANEOUS
  Administered 2024-02-08: 2 [IU] via SUBCUTANEOUS
  Administered 2024-02-09 (×3): 1 [IU] via SUBCUTANEOUS
  Administered 2024-02-09: 2 [IU] via SUBCUTANEOUS
  Administered 2024-02-10 – 2024-02-11 (×5): 1 [IU] via SUBCUTANEOUS
  Administered 2024-02-11 – 2024-02-12 (×2): 2 [IU] via SUBCUTANEOUS
  Administered 2024-02-14 – 2024-02-16 (×4): 1 [IU] via SUBCUTANEOUS
  Administered 2024-02-17: 2 [IU] via SUBCUTANEOUS
  Administered 2024-02-17: 1 [IU] via SUBCUTANEOUS
  Administered 2024-02-18: 2 [IU] via SUBCUTANEOUS
  Administered 2024-02-19 – 2024-02-20 (×4): 1 [IU] via SUBCUTANEOUS
  Filled 2024-01-15 (×6): qty 1
  Filled 2024-01-15: qty 2
  Filled 2024-01-15 (×2): qty 1
  Filled 2024-01-15: qty 2
  Filled 2024-01-15 (×2): qty 1

## 2024-01-15 MED ORDER — OLANZAPINE 10 MG IM SOLR
5.0000 mg | Freq: Three times a day (TID) | INTRAMUSCULAR | Status: DC | PRN
  Administered 2024-01-17 – 2024-02-19 (×8): 5 mg via INTRAVENOUS
  Filled 2024-01-15 (×18): qty 10

## 2024-01-15 NOTE — Progress Notes (Signed)
 eLink Physician-Brief Progress Note Patient Name: Amanda [name] DOB: [birth year] MRN: [id number]   Date of Service  01/15/2024  HPI/Events of Note  Patient remains verbally and physically aggressive towards staff.  Has a Posey belt in place and scheduled Seroquel  twice daily.  QTc 494.  eICU Interventions  Extend restraints to wrists and ankles as needed  Maintain Atarax  as needed  Add breakthrough olanzapine  for severe agitated delirium     Intervention Category Minor Interventions: Agitation / anxiety - evaluation and management  Rande Dario 01/15/2024, 4:21 AM

## 2024-01-15 NOTE — Plan of Care (Signed)
 Her mood and behavior is labile.  She will be calm and cooperative one moment then will, all of a sudden, will kick or scratch you and try try to climb out of the bed.  She does that quite often then will laugh and apologize, just to do it again.  She did start to eat a small amount of her diet today.    Problem: Safety: Goal: Non-violent Restraint(s) Outcome: Progressing   Problem: Education: Goal: Ability to describe self-care measures that may prevent or decrease complications (Diabetes Survival Skills Education) will improve Outcome: Progressing   Problem: Coping: Goal: Ability to adjust to condition or change in health will improve Outcome: Progressing   Problem: Metabolic: Goal: Ability to maintain appropriate glucose levels will improve Outcome: Progressing   Problem: Nutritional: Goal: Maintenance of adequate nutrition will improve Outcome: Progressing   Problem: Skin Integrity: Goal: Risk for impaired skin integrity will decrease Outcome: Progressing   Problem: Clinical Measurements: Goal: Will remain free from infection Outcome: Progressing   Problem: Coping: Goal: Level of anxiety will decrease Outcome: Progressing   Problem: Skin Integrity: Goal: Risk for impaired skin integrity will decrease Outcome: Progressing   Problem: Intracerebral Hemorrhage Tissue Perfusion: Goal: Complications of Intracerebral Hemorrhage will be minimized Outcome: Progressing   Problem: Nutrition: Goal: Risk of aspiration will decrease Outcome: Progressing   Problem: Education: Goal: Knowledge of secondary prevention will improve (MUST DOCUMENT ALL) Outcome: Progressing

## 2024-01-15 NOTE — Progress Notes (Signed)
 NEUROSURGERY PROGRESS NOTE  Doing ok, no acute events over night. Continue supportive care.   Temp:  [97.8 F (36.6 C)-99.1 F (37.3 C)] 98 F (36.7 C) (10/04 0735) Pulse Rate:  [69-82] 74 (10/04 0855) Resp:  [19-27] 19 (10/04 0735) BP: (146-152)/(72-93) 146/72 (10/04 0855) SpO2:  [94 %-100 %] 94 % (10/04 0735) Weight:  [54.2 kg] 54.2 kg (10/04 0500)    Suzen Chiquita Pean, NP 01/15/2024 10:15 AM

## 2024-01-15 NOTE — Progress Notes (Signed)
 PROGRESS NOTE    Amanda [name]  FMW:[id number] DOB: [birth year] DOA: 01/03/2024 PCP: Patient, No Pcp Per     Brief Narrative:  Amanda [name] is a 63 yr old female patient with HTN, CHF (systolic and diastolic, EF 30%, G3DD, RVSP 50.3 mmHg), DM-2, and polysubstance/cocaine abuse 3 hrs before presentation, who called EMS for AMS, lethargy and near syncope. Her BP in ED 180/120, repeat BP 220/126. She was oriented to person only. Her condition deteriorated (unable to protect her airway and more lethargic) and got intubated. Clevidipine  was started for BP control and Propofol  for sedation.   9/22: Admitted to ICU.  Intubated in ED.  Right frontal ventriculostomy by Dr. Debby 9/23: Extubated 9/24: Cortrak placed for nutrition  9/30: Fevers, workup ordered 10/1: EVD clamped 10/3: EVD removed 10/4: TRH assumed care  New events last 24 hours / Subjective: Has been having intermittent episodes of agitation, confusion with episodes of calm.  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  Remains on tube feed through cortrak until p.o. sufficient  Acute metabolic encephalopathy - In setting of above - Atarax  as needed for anxiety, Zyprexa  as needed for agitation, Seroquel  twice daily  Chronic combined systolic and diastolic CHF - Entresto , Aldactone , Coreg   Diabetes type 2 -  A1c 6.6 - Sliding scale insulin   Polysubstance abuse/cocaine abuse - Cessation counseling  Right apical pleural scarring versus density - Need repeat CT chest in 3 months    DVT prophylaxis:  SCD's Start: 01/11/24 2301 heparin  injection 5,000 Units Start: 01/05/24 1015 Place and maintain sequential compression device Start: 01/03/24 0611 SCDs Start: 01/03/24 0429  Code Status: Full code Family Communication: None at bedside Disposition Plan: SNF placement Status is: Inpatient Remains inpatient appropriate because: Remains altered    Antimicrobials:  Anti-infectives (From admission, onward)    Start     Dose/Rate Route Frequency Ordered Stop   01/13/24 0600  ceFAZolin (ANCEF) IVPB 2g/100 mL premix        2 g 200 mL/hr over 30 Minutes Intravenous On call to O.R. 01/11/24 2300 01/13/24 0557        Objective: Vitals:   01/14/24 2003 01/15/24 0500 01/15/24 0735 01/15/24 0855  BP: (!) 152/82  (!) 147/84 (!) 146/72  Pulse: 77  69 74  Resp: 20  19   Temp: 97.8 F (36.6 C)  98 F (36.7 C)   TempSrc: Oral  Oral   SpO2: 97%  94%   Weight:  54.2 kg    Height:        Intake/Output Summary (Last 24 hours) at 01/15/2024 1129 Last data filed at 01/15/2024 1000 Gross per 24 hour  Intake --  Output 500 ml  Net -500 ml   Filed Weights   01/12/24 0500 01/14/24 0714 01/15/24 0500  Weight: 55.9 kg 53.8 kg 54.2 kg    Examination:  General exam: Appears somnolent this  morning  Data Reviewed: I have personally reviewed following labs and imaging studies  CBC: Recent Labs  Lab 01/09/24 0550 01/10/24 0544 01/15/24 0623  WBC 9.6 9.9 8.9  NEUTROABS 6.5 7.2 6.0  HGB 14.7 14.6 13.1  HCT 43.9 43.9 39.7  MCV 85.1 85.6 88.0  PLT 346 361 408*   Basic Metabolic Panel: Recent Labs  Lab 01/09/24 0550 01/10/24 0544 01/15/24 0623  NA 132* 134* 135  K 4.5 4.5 4.6  CL 102 102 103  CO2 21* 20* 21*  GLUCOSE 137* 130* 130*  BUN 22 25* 37*  CREATININE 0.94 0.99 0.99   CALCIUM 9.0 9.0 9.3  MG 1.9 2.4 2.2  PHOS  --   --  4.2   GFR: Estimated Creatinine Clearance: 50.4 mL/min (by C-G formula based on SCr of 0.99 mg/dL). Liver Function Tests: No results for input(s): AST, ALT, ALKPHOS, BILITOT, PROT, ALBUMIN in the last 168 hours. No results for input(s): LIPASE, AMYLASE in the last 168 hours. No results for input(s): AMMONIA in the last 168 hours. Coagulation Profile: No results for input(s): INR, PROTIME in the last 168 hours. Cardiac Enzymes: No results for input(s): CKTOTAL, CKMB, CKMBINDEX, TROPONINI in the last 168 hours. BNP (last 3 results) No results for input(s): PROBNP in the last 8760 hours. HbA1C: No results for input(s): HGBA1C in the last 72 hours. CBG: Recent Labs  Lab 01/14/24 1556 01/14/24 2005 01/15/24 0100 01/15/24 0437 01/15/24 0813  GLUCAP 133* 119* 170* 117* 101*   Lipid Profile: No results for input(s): CHOL, HDL, LDLCALC, TRIG, CHOLHDL, LDLDIRECT in the last 72 hours. Thyroid Function Tests: No results for input(s): TSH, T4TOTAL, FREET4, T3FREE, THYROIDAB in the last 72 hours. Anemia Panel: No results for input(s): VITAMINB12, FOLATE, FERRITIN, TIBC, IRON, RETICCTPCT in the last 72 hours. Sepsis Labs: Recent Labs  Lab 01/11/24 2015  PROCALCITON 0.12    Recent Results (from the past 240 hours)  Culture, blood (Routine X 2) w Reflex to ID Panel     Status: None (Preliminary result)   Collection Time: 01/11/24  8:15 PM   Specimen: BLOOD  Result Value Ref Range Status   Specimen Description BLOOD SITE NOT SPECIFIED  Final   Special Requests   Final    BOTTLES DRAWN AEROBIC ONLY Blood Culture adequate volume   Culture   Final    NO GROWTH 4 DAYS Performed at [facility], [address], [place], [place] [address]    Report Status PENDING  Incomplete  Culture, blood (Routine X 2) w Reflex to ID Panel     Status: None (Preliminary  result)   Collection Time: 01/11/24  8:22 PM   Specimen: BLOOD  Result Value Ref Range Status   Specimen Description BLOOD SITE NOT SPECIFIED  Final   Special Requests   Final    BOTTLES DRAWN AEROBIC ONLY Blood Culture adequate volume   Culture   Final    NO GROWTH 4 DAYS Performed at [facility], [address] [place], [place] [address]    Report Status PENDING  Incomplete      Radiology Studies: No results found.    Scheduled Meds:  carvedilol   25 mg Per Tube BID WC   Chlorhexidine  Gluconate Cloth  6 each Topical Daily   feeding supplement (PROSource TF20)  60 mL Per Tube Daily   heparin  injection (subcutaneous)  5,000 Units Subcutaneous Q8H   mouth rinse  15 mL Mouth Rinse 4 times per day  QUEtiapine   25 mg Per Tube BID   sacubitril -valsartan   1 tablet Per Tube BID   spironolactone   25 mg Per Tube Daily   Continuous Infusions:  feeding supplement (OSMOLITE 1.5 CAL) 40 mL/hr at 01/14/24 0800     LOS: 12 days   Time spent: 40 minutes   Delon Hoe, DO [facility] 01/15/2024, 11:29 AM   Available via Epic secure chat 7am-7pm After these hours, please refer to coverage provider listed on [email or web address]

## 2024-01-16 DIAGNOSIS — I610 Nontraumatic intracerebral hemorrhage in hemisphere, subcortical: Secondary | ICD-10-CM | POA: Diagnosis not present

## 2024-01-16 LAB — CULTURE, BLOOD (ROUTINE X 2)
Culture: NO GROWTH
Culture: NO GROWTH
Special Requests: ADEQUATE
Special Requests: ADEQUATE

## 2024-01-16 LAB — GLUCOSE, CAPILLARY
Glucose-Capillary: 91 mg/dL (ref 70–99)
Glucose-Capillary: 163 mg/dL — ABNORMAL HIGH (ref 70–99)
Glucose-Capillary: 194 mg/dL — ABNORMAL HIGH (ref 70–99)
Glucose-Capillary: 193 mg/dL — ABNORMAL HIGH (ref 70–99)
Glucose-Capillary: 177 mg/dL — ABNORMAL HIGH (ref 70–99)

## 2024-01-16 MED ORDER — HYDRALAZINE HCL 20 MG/ML IJ SOLN: 10.0000 mg | INTRAMUSCULAR | Status: DC | PRN

## 2024-01-16 MED ORDER — AMLODIPINE BESYLATE 5 MG PO TABS
10.0000 mg | ORAL_TABLET | Freq: Every day | ORAL | Status: DC
  Administered 2024-01-17 – 2024-01-21 (×5): 10 mg via ORAL
  Filled 2024-01-16 (×5): qty 2

## 2024-01-16 MED ORDER — SACUBITRIL-VALSARTAN 97-103 MG PO TABS
1.0000 | ORAL_TABLET | Freq: Two times a day (BID) | ORAL | Status: DC
  Administered 2024-01-16 – 2024-01-21 (×10): 1 via ORAL
  Filled 2024-01-16 (×11): qty 1

## 2024-01-16 MED ORDER — AMLODIPINE BESYLATE 5 MG PO TABS: 5.0000 mg | ORAL_TABLET | Freq: Every day | ORAL | Status: DC

## 2024-01-16 MED ORDER — QUETIAPINE FUMARATE 25 MG PO TABS
25.0000 mg | ORAL_TABLET | Freq: Two times a day (BID) | ORAL | Status: DC
  Administered 2024-01-16 – 2024-01-17 (×2): 25 mg via ORAL
  Filled 2024-01-16 (×2): qty 1

## 2024-01-16 MED ORDER — HYDROXYZINE HCL 10 MG PO TABS
10.0000 mg | ORAL_TABLET | Freq: Three times a day (TID) | ORAL | Status: DC | PRN
  Administered 2024-01-20 – 2024-01-21 (×2): 10 mg via ORAL
  Filled 2024-01-16 (×2): qty 1

## 2024-01-16 MED ORDER — CARVEDILOL 12.5 MG PO TABS
25.0000 mg | ORAL_TABLET | Freq: Two times a day (BID) | ORAL | Status: DC
  Administered 2024-01-17 – 2024-01-21 (×9): 25 mg via ORAL
  Filled 2024-01-16 (×9): qty 2

## 2024-01-16 MED ORDER — SPIRONOLACTONE 25 MG PO TABS
25.0000 mg | ORAL_TABLET | Freq: Every day | ORAL | Status: DC
  Administered 2024-01-17 – 2024-01-21 (×5): 25 mg via ORAL
  Filled 2024-01-16 (×5): qty 1

## 2024-01-16 MED ORDER — AMLODIPINE BESYLATE 5 MG PO TABS
10.0000 mg | ORAL_TABLET | Freq: Every day | ORAL | Status: DC
  Administered 2024-01-16: 10 mg
  Filled 2024-01-16: qty 2

## 2024-01-16 MED ORDER — HYDRALAZINE HCL 20 MG/ML IJ SOLN
10.0000 mg | INTRAMUSCULAR | Status: DC | PRN
  Administered 2024-01-17 – 2024-01-27 (×5): 10 mg via INTRAVENOUS
  Filled 2024-01-16 (×6): qty 1

## 2024-01-16 NOTE — Progress Notes (Signed)
 PROGRESS NOTE    BRIAN KOCOUREK  FMW:[id number] DOB: 13-Nov-1960 DOA: 01/03/2024 PCP: Patient, No Pcp Per     Brief Narrative:  Amanda [name] is a 63 yr old female patient with HTN, CHF (systolic and diastolic, EF 30%, G3DD, RVSP 50.3 mmHg), DM-2, and polysubstance/cocaine abuse 3 hrs before presentation, who called EMS for AMS, lethargy and near syncope. Her BP in ED 180/120, repeat BP 220/126. She was oriented to person only. Her condition deteriorated (unable to protect her airway and more lethargic) and got intubated. Clevidipine  was started for BP control and Propofol  for sedation.   9/22: Admitted to ICU.  Intubated in ED.  Right frontal ventriculostomy by Dr. Debby 9/23: Extubated 9/24: Cortrak placed for nutrition  9/30: Fevers, workup ordered 10/1: EVD clamped 10/3: EVD removed 10/4: TRH assumed care  New events last 24 hours / Subjective: Much more calm this morning. She is out of restraints. 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  Remains on tube feed through cortrak until p.o. sufficient  Acute metabolic encephalopathy - In setting of above - Atarax  as needed for anxiety, Zyprexa  as needed for agitation, Seroquel  twice daily  Chronic combined systolic and diastolic CHF HTN - Entresto , Aldactone , Coreg , Norvasc    Diabetes type 2 - A1c 6.6 - Sliding scale  insulin   Polysubstance abuse/cocaine abuse - Cessation counseling  Right apical pleural scarring versus density - Need repeat CT chest in 3 months    DVT prophylaxis:  SCD's Start: 01/11/24 2301 heparin  injection 5,000 Units Start: 01/05/24 1015 Place and maintain sequential compression device Start: 01/03/24 0611 SCDs Start: 01/03/24 0429  Code Status: Full code Family Communication: None at bedside Disposition Plan: SNF placement Status is: Inpatient Remains inpatient appropriate because: Need increase PO intake     Antimicrobials:  Anti-infectives (From admission, onward)    Start     Dose/Rate Route Frequency Ordered Stop   01/13/24 0600  ceFAZolin (ANCEF) IVPB 2g/100 mL premix        2 g 200 mL/hr over 30 Minutes Intravenous On call to O.R. 01/11/24 2300 01/13/24 0557        Objective: Vitals:   01/16/24 0555 01/16/24 0600 01/16/24 0800 01/16/24 1117  BP: (!) 192/94 (!) 176/88 (!) 156/88 139/86  Pulse:  97 (!) 103 85  Resp:   16 18  Temp:  99.4 F (37.4 C) (!) 97.4 F (36.3 C) 99 F (37.2 C)  TempSrc:  Oral Oral Oral  SpO2:  94% 98% 93%  Weight:      Height:        Intake/Output Summary (Last 24 hours) at 01/16/2024 1229 Last data filed at 01/16/2024 0346 Gross per 24 hour  Intake 120 ml  Output 450 ml  Net -330 ml   Filed Weights   01/14/24 0714 01/15/24 0500 01/16/24 0500  Weight: 53.8 kg 54.2 kg 57.2 kg    Examination: General exam:  Appears calm and comfortable, easily arousable  Respiratory system: Clear to auscultation. Respiratory effort normal. Cardiovascular system: S1 & S2 heard, RRR.   Data Reviewed: I have personally reviewed following labs and imaging studies  CBC: Recent Labs  Lab 01/10/24 0544 01/15/24 0623  WBC 9.9 8.9  NEUTROABS 7.2 6.0  HGB 14.6 13.1  HCT 43.9 39.7  MCV 85.6 88.0  PLT 361 408*   Basic Metabolic Panel: Recent Labs  Lab 01/10/24 0544 01/15/24 0623  NA 134* 135  K 4.5 4.6  CL 102 103  CO2 20*  21*  GLUCOSE 130* 130*  BUN 25* 37*  CREATININE 0.99 0.99  CALCIUM 9.0 9.3  MG 2.4 2.2  PHOS  --  4.2   GFR: Estimated Creatinine Clearance: 53 mL/min (by C-G formula based on SCr of 0.99 mg/dL). Liver Function Tests: No results for input(s): AST, ALT, ALKPHOS, BILITOT, PROT, ALBUMIN in the last 168 hours. No results for input(s): LIPASE, AMYLASE in the last 168 hours. No results for input(s): AMMONIA in the last 168 hours. Coagulation Profile: No results for input(s): INR, PROTIME in the last 168 hours. Cardiac Enzymes: No results for input(s): CKTOTAL, CKMB, CKMBINDEX, TROPONINI in the last 168 hours. BNP (last 3 results) No results for input(s): PROBNP in the last 8760 hours. HbA1C: No results for input(s): HGBA1C in the last 72 hours. CBG: Recent Labs  Lab 01/15/24 2007 01/15/24 2341 01/16/24 0353 01/16/24 0813 01/16/24 1118  GLUCAP 124* 129* 91 163* 194*   Lipid Profile: No results for input(s): CHOL, HDL, LDLCALC, TRIG, CHOLHDL, LDLDIRECT in the last 72 hours. Thyroid Function Tests: No results for input(s): TSH, T4TOTAL, FREET4, T3FREE, THYROIDAB in the last 72 hours. Anemia Panel: No results for input(s): VITAMINB12, FOLATE, FERRITIN, TIBC, IRON, RETICCTPCT in the last 72 hours. Sepsis Labs: Recent Labs  Lab 01/11/24 2015  PROCALCITON 0.12    Recent Results (from the past 240 hours)  Culture, blood (Routine X 2) w Reflex to ID Panel     Status: None   Collection Time: 01/11/24  8:15 PM   Specimen: BLOOD  Result Value Ref Range Status   Specimen Description BLOOD SITE NOT SPECIFIED  Final   Special Requests   Final    BOTTLES DRAWN AEROBIC ONLY Blood Culture adequate volume   Culture   Final    NO GROWTH 5 DAYS Performed at [facility], [address] [place], [place] [address]    Report Status 01/16/2024 FINAL  Final  Culture, blood (Routine X 2) w Reflex to ID Panel      Status: None   Collection Time: 01/11/24  8:22 PM   Specimen: BLOOD  Result Value Ref Range Status   Specimen Description BLOOD SITE NOT SPECIFIED  Final   Special Requests   Final    BOTTLES DRAWN AEROBIC ONLY Blood Culture adequate volume   Culture   Final    NO GROWTH 5 DAYS Performed at [facility], [address], [place], [place] [address]    Report Status 01/16/2024 FINAL  Final      Radiology Studies: No results found.    Scheduled Meds:  amLODipine   10 mg Per Tube Daily   carvedilol   25 mg Per Tube BID WC   Chlorhexidine  Gluconate Cloth  6 each Topical Daily   feeding supplement (PROSource TF20)  60 mL Per Tube Daily   heparin  injection (subcutaneous)  5,000 Units Subcutaneous Q8H   insulin  aspart  0-9 Units Subcutaneous  Q4H   mouth rinse  15 mL Mouth Rinse 4 times per day   QUEtiapine   25 mg Per Tube BID   sacubitril -valsartan   1 tablet Per Tube BID   spironolactone   25 mg Per Tube Daily   Continuous Infusions:  feeding supplement (OSMOLITE 1.5 CAL) 40 mL/hr at 01/16/24 0346     LOS: 13 days   Time spent: 25 minutes   Delon Hoe, DO [facility] 01/16/2024, 12:29 PM   Available via Epic secure chat 7am-7pm After these hours, please refer to coverage provider listed on [email or web address]

## 2024-01-16 NOTE — Plan of Care (Signed)
 Calmer and more cooperative today but gets easily irritated. Continues to refuse to take food by mouth.  Problem: Coping: Goal: Ability to adjust to condition or change in health will improve Outcome: Progressing   Problem: Health Behavior/Discharge Planning: Goal: Ability to identify and utilize available resources and services will improve Outcome: Progressing   Problem: Health Behavior/Discharge Planning: Goal: Ability to manage health-related needs will improve Outcome: Progressing   Problem: Metabolic: Goal: Ability to maintain appropriate glucose levels will improve Outcome: Progressing   Problem: Nutritional: Goal: Maintenance of adequate nutrition will improve Outcome: Progressing   Problem: Skin Integrity: Goal: Risk for impaired skin integrity will decrease Outcome: Progressing   Problem: Education: Goal: Knowledge of patient specific risk factors will improve (DELETE if not current risk factor) Outcome: Progressing   Problem: Intracerebral Hemorrhage Tissue Perfusion: Goal: Complications of Intracerebral Hemorrhage will be minimized Outcome: Progressing

## 2024-01-17 DIAGNOSIS — I610 Nontraumatic intracerebral hemorrhage in hemisphere, subcortical: Secondary | ICD-10-CM | POA: Diagnosis not present

## 2024-01-17 LAB — GLUCOSE, CAPILLARY
Glucose-Capillary: 120 mg/dL — ABNORMAL HIGH (ref 70–99)
Glucose-Capillary: 158 mg/dL — ABNORMAL HIGH (ref 70–99)
Glucose-Capillary: 168 mg/dL — ABNORMAL HIGH (ref 70–99)
Glucose-Capillary: 175 mg/dL — ABNORMAL HIGH (ref 70–99)
Glucose-Capillary: 185 mg/dL — ABNORMAL HIGH (ref 70–99)
Glucose-Capillary: 198 mg/dL — ABNORMAL HIGH (ref 70–99)
Glucose-Capillary: 221 mg/dL — ABNORMAL HIGH (ref 70–99)

## 2024-01-17 MED ORDER — QUETIAPINE FUMARATE 25 MG PO TABS
25.0000 mg | ORAL_TABLET | Freq: Every day | ORAL | Status: DC
  Administered 2024-01-18 – 2024-01-20 (×3): 25 mg via ORAL
  Filled 2024-01-17 (×3): qty 1

## 2024-01-17 NOTE — Progress Notes (Addendum)
 PROGRESS NOTE    [name]  FMW:[id number] DOB: [birth year] DOA: 01/03/2024 PCP: Patient, No Pcp Per     Brief Narrative:  Amanda [name] is a 63 yr old female patient with HTN, CHF (systolic and diastolic, EF 30%, G3DD, RVSP 50.3 mmHg), DM-2, and polysubstance/cocaine abuse 3 hrs before presentation, who called EMS for AMS, lethargy and near syncope. Her BP in ED 180/120, repeat BP 220/126. She was oriented to person only. Her condition deteriorated (unable to protect her airway and more lethargic) and got intubated. Clevidipine  was started for BP control and Propofol  for sedation.   9/22: Admitted to ICU.  Intubated in ED.  Right frontal ventriculostomy by Dr. Debby 9/23: Extubated 9/24: Cortrak placed for nutrition  9/30: Fevers, workup ordered 10/1: EVD clamped 10/3: EVD removed 10/4: TRH assumed care  New events last 24 hours / Subjective: Has had intermittent LOC. This morning, she is alert and calm, asks for coffee, does not have appetite and declines her grits on tray. She does not answer questions regarding orientation/place. Seems to have loss of train of thought in answering questions. 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  Remains on tube feed through cortrak until p.o. sufficient  Acute metabolic encephalopathy - In setting of above - Atarax  as needed for anxiety, Zyprexa  as needed for  agitation, Seroquel  twice daily  Chronic combined systolic and diastolic CHF HTN - Entresto , Aldactone , Coreg , Norvasc    Diabetes type 2 - A1c 6.6 - Sliding scale insulin   Polysubstance abuse/cocaine abuse - Cessation counseling  Right apical pleural scarring versus density - Need repeat CT chest in 3 months  Severe malnutrition - Estimated body mass index is 20.47 kg/m as calculated from the following:   Height as of this encounter: 5' 5 (1.651 m).   Weight as of this encounter: 55.8 kg. - Temporal muscle wasting seen - Continue TF    DVT prophylaxis:  SCD's Start: 01/11/24 2301 heparin  injection 5,000 Units Start: 01/05/24 1015 Place and maintain sequential compression device Start: 01/03/24 0611 SCDs Start: 01/03/24 0429  Code Status: Full code Family Communication: None at bedside Disposition Plan: SNF placement, ?LTACH candidate  Status is: Inpatient Remains inpatient appropriate because: Need increase PO intake, remains on cortrak     Antimicrobials:  Anti-infectives (From admission, onward)    Start     Dose/Rate Route Frequency Ordered Stop   01/13/24 0600  ceFAZolin (ANCEF) IVPB 2g/100 mL premix        2 g 200 mL/hr over 30 Minutes Intravenous On call to O.R. 01/11/24 2300 01/13/24 0557        Objective: Vitals:   01/17/24 0022 01/17/24 0400 01/17/24 0500 01/17/24 0804  BP: 114/69 137/87  136/84  Pulse: 73 78  75  Resp: 18 18  20   Temp: 98 F (36.7 C) 97.7 F (36.5 C)  98 F (36.7 C)  TempSrc: Axillary Oral  SpO2: 100% 95%  96%  Weight:   55.8 kg   Height:        Intake/Output Summary (Last 24 hours) at 01/17/2024 0920 Last data filed at 01/16/2024 2000 Gross per 24 hour  Intake --  Output 200 ml  Net -200 ml   Filed Weights   01/15/24 0500 01/16/24 0500 01/17/24 0500  Weight: 54.2 kg 57.2 kg 55.8 kg    Examination: General exam: Appears calm and comfortable Respiratory system: Clear to auscultation. Respiratory effort  normal. Cardiovascular system: S1 & S2 heard, RRR.   Data Reviewed: I have personally reviewed following labs and imaging studies  CBC: Recent Labs  Lab 01/15/24 0623  WBC 8.9  NEUTROABS 6.0  HGB 13.1  HCT 39.7  MCV 88.0  PLT 408*   Basic Metabolic Panel: Recent Labs  Lab 01/15/24 0623  NA 135  K 4.6  CL 103  CO2 21*  GLUCOSE 130*  BUN 37*  CREATININE 0.99  CALCIUM 9.3  MG 2.2  PHOS 4.2   GFR: Estimated Creatinine Clearance: 51.9 mL/min (by C-G formula based on SCr of 0.99 mg/dL). Liver Function Tests: No results for input(s): AST, ALT, ALKPHOS, BILITOT, PROT, ALBUMIN in the last 168 hours. No results for input(s): LIPASE, AMYLASE in the last 168 hours. No results for input(s): AMMONIA in the last 168 hours. Coagulation Profile: No results for input(s): INR, PROTIME in the last 168 hours. Cardiac Enzymes: No results for input(s): CKTOTAL, CKMB, CKMBINDEX, TROPONINI in the last 168 hours. BNP (last 3 results) No results for input(s): PROBNP in the last 8760 hours. HbA1C: No results for input(s): HGBA1C in the last 72 hours. CBG: Recent Labs  Lab 01/16/24 1556 01/16/24 2015 01/17/24 0019 01/17/24 0424 01/17/24 0800  GLUCAP 193* 177* 221* 185* 168*   Lipid Profile: No results for input(s): CHOL, HDL, LDLCALC, TRIG, CHOLHDL, LDLDIRECT in the last 72 hours. Thyroid Function Tests: No results for input(s): TSH, T4TOTAL, FREET4, T3FREE, THYROIDAB in the last 72 hours. Anemia Panel: No results for input(s): VITAMINB12, FOLATE, FERRITIN, TIBC, IRON, RETICCTPCT in the last 72 hours. Sepsis Labs: Recent Labs  Lab 01/11/24 2015  PROCALCITON 0.12    Recent Results (from the past 240 hours)  Culture, blood (Routine X 2) w Reflex to ID Panel     Status: None   Collection Time: 01/11/24  8:15 PM   Specimen: BLOOD  Result Value Ref Range Status   Specimen Description BLOOD SITE NOT SPECIFIED   Final   Special Requests   Final    BOTTLES DRAWN AEROBIC ONLY Blood Culture adequate volume   Culture   Final    NO GROWTH 5 DAYS Performed at [facility], [address] [place], [place] [address]    Report Status 01/16/2024 FINAL  Final  Culture, blood (Routine X 2) w Reflex to ID Panel     Status: None   Collection Time: 01/11/24  8:22 PM   Specimen: BLOOD  Result Value Ref Range Status   Specimen Description BLOOD SITE NOT SPECIFIED  Final   Special Requests   Final    BOTTLES DRAWN AEROBIC ONLY Blood Culture adequate volume   Culture   Final    NO GROWTH 5 DAYS Performed at [facility], [address], [place], [place] [address]    Report Status 01/16/2024 FINAL  Final      Radiology Studies: No results found.    Scheduled Meds:  amLODipine   10 mg Oral Daily  carvedilol   25 mg Oral BID WC   feeding supplement (PROSource TF20)  60 mL Per Tube Daily   heparin  injection (subcutaneous)  5,000 Units Subcutaneous Q8H   insulin  aspart  0-9 Units Subcutaneous Q4H   mouth rinse  15 mL Mouth Rinse 4 times per day   QUEtiapine   25 mg Oral BID   sacubitril -valsartan   1 tablet Oral BID   spironolactone   25 mg Oral Daily   Continuous Infusions:  feeding supplement (OSMOLITE 1.5 CAL) 1,000 mL (01/16/24 2218)     LOS: 14 days   Time spent: 25 minutes   Delon Hoe, DO [facility] 01/17/2024, 9:20 AM   Available via Epic secure chat 7am-7pm After these hours, please refer to coverage provider listed on [email or web address]

## 2024-01-17 NOTE — Progress Notes (Signed)
 Physical Therapy Treatment Patient Details Name: Amanda [name] MRN: [id number] DOB: 1960/10/07 Today's Date: 01/17/2024   History of Present Illness 63 yo female presents to [facility] on 9/22 with lethargy and near-syncope s/p cocaine and opioid ingestion. 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 S/p R frontal ventriculostomy, burr holes 9/22. ETT 9/22-9/23. PMHx of cocaine abuse, CHF, CKD, chronic cough, HTN and DM2.    PT Comments  Pt with regression towards her physical therapy goals. Had previously received bed bath and now received with lethargy and following < 10% of commands. Pt requiring +2 assist for bed mobility and transfers to partial standing. Pt with mild agitation when attempting to modify posture sitting edge of bed due to heavy anterior lean. Attempted to use music to increase engagement/arousal without success. Patient will benefit from continued inpatient follow up therapy, <3 hours/day.   If plan is discharge home, recommend the following: Two people to help with walking and/or transfers;Two people to help with bathing/dressing/bathroom;Supervision due to cognitive status   Can travel by private vehicle     No  Equipment Recommendations  Hospital bed;Wheelchair (measurements PT);Wheelchair cushion (measurements PT);Hoyer lift    Recommendations for Other Services       Precautions / Restrictions Precautions Precautions: Fall;Other (comment) Recall of Precautions/Restrictions: Impaired Precaution/Restrictions Comments: SBP < 160, bil mitt, NGT Restrictions Weight Bearing Restrictions Per Provider Order: No     Mobility  Bed Mobility Overal bed mobility: Needs Assistance Bed Mobility: Rolling, Sidelying to Sit, Sit to Supine Rolling: Max assist Sidelying to sit: Total assist, +2 for physical assistance   Sit to supine: +2 for physical assistance, Max assist   General  bed mobility comments: Use of bed pad to shift hips into left sidelying, hand over hand guidance for locating bed rail and pt able to grasp with R hand, assist for BLE and trunk management into and out of bed    Transfers Overall transfer level: Needs assistance Equipment used: 2 person hand held assist Transfers: Sit to/from Stand Sit to Stand: Total assist, +2 physical assistance           General transfer comment: TotalA + 2 to stand from edge of bed with left knee block, pt with significantly increased trunk/hip flexion and anterior weight shift, partially stood for brief time period    Ambulation/Gait               General Gait Details: unable   Stairs             Wheelchair Mobility     Tilt Bed    Modified Rankin (Stroke Patients Only) Modified Rankin (Stroke Patients Only) Pre-Morbid Rankin Score: No symptoms Modified Rankin: Severe disability     Balance Overall balance assessment: Needs assistance Sitting-balance support: Feet supported Sitting balance-Leahy Scale: Poor Sitting balance - Comments: Mod-maxA for static sitting balance with RUE vs BUE support with increased anterior lean Postural control:  (anterior) Standing balance support: Bilateral upper extremity supported Standing balance-Leahy Scale: Zero                              Communication Communication Communication: Impaired Factors Affecting Communication: Reduced clarity of speech;Difficulty expressing self  Cognition Arousal: Lethargic Behavior During Therapy: Flat affect   PT - Cognitive impairments: Awareness, Attention, Difficult to assess, Safety/Judgement, Sequencing, Initiation, Problem solving Difficult to assess due to: Level of arousal Orientation impairments: Place,  Time, Situation                   PT - Cognition Comments: Pt lethargic and when assisting with modifying posture would get mildly agitated. Little to no command following today,  would say some phrases like stop playing with me. Following commands: Impaired Following commands impaired: Follows one step commands inconsistently (<10%)    Cueing Cueing Techniques: Verbal cues, Tactile cues, Visual cues, Gestural cues  Exercises Other Exercises Other Exercises: Sitting EOB: Chair slides with LUE AAROM    General Comments        Pertinent Vitals/Pain Pain Assessment Pain Assessment: Faces Faces Pain Scale: No hurt    Home Living                          Prior Function            PT Goals (current goals can now be found in the care plan section) Acute Rehab PT Goals Patient Stated Goal: did not state Time For Goal Achievement: 01/31/24 Potential to Achieve Goals: Fair Progress towards PT goals: Not progressing toward goals - comment    Frequency    Min 2X/week      PT Plan      Co-evaluation PT/OT/SLP Co-Evaluation/Treatment: Yes Reason for Co-Treatment: Complexity of the patient's impairments (multi-system involvement);Necessary to address cognition/behavior during functional activity;For patient/therapist safety;To address functional/ADL transfers PT goals addressed during session: Mobility/safety with mobility;Balance        AM-PAC PT 6 Clicks Mobility   Outcome Measure  Help needed turning from your back to your side while in a flat bed without using bedrails?: A Lot Help needed moving from lying on your back to sitting on the side of a flat bed without using bedrails?: Total Help needed moving to and from a bed to a chair (including a wheelchair)?: Total Help needed standing up from a chair using your arms (e.g., wheelchair or bedside chair)?: Total Help needed to walk in hospital room?: Total Help needed climbing 3-5 steps with a railing? : Total 6 Click Score: 7    End of Session Equipment Utilized During Treatment: Gait belt Activity Tolerance: Patient limited by lethargy Patient left: in bed;with call  bell/phone within reach;with bed alarm set Nurse Communication: Mobility status PT Visit Diagnosis: Unsteadiness on feet (R26.81);Difficulty in walking, not elsewhere classified (R26.2);Other abnormalities of gait and mobility (R26.89);Other symptoms and signs involving the nervous system (R29.898);Muscle weakness (generalized) (M62.81)     Time: 8887-8859 PT Time Calculation (min) (ACUTE ONLY): 28 min  Charges:    $Therapeutic Activity: 8-22 mins PT General Charges $$ ACUTE PT VISIT: 1 Visit                     Amanda [name], PT, DPT Acute Rehabilitation Services Office [phone]    Amanda [name] 01/17/2024, 12:38 PM

## 2024-01-17 NOTE — Progress Notes (Signed)
 Occupational Therapy Treatment Patient Details Name: Amanda [name] MRN: [id number] DOB: 1961-02-17 Today's Date: 01/17/2024   History of present illness 63 yo female presents to [facility] on 9/22 with lethargy and near-syncope s/p cocaine and opioid ingestion. 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 S/p R frontal ventriculostomy, burr holes 9/22. ETT 9/22-9/23. PMHx of cocaine abuse, CHF, CKD, chronic cough, HTN and DM2.   OT comments  Pt greeted in supine, lethargic and very drowsy. Wakefulness mildly improved with multisensory stim (including music) and  mobility to EOB. Pt requiring mod-max A to sustain seated balance EOB with heavy anterior lean. Needs total A for most ADLs and total A +2 to stand at EOB, very flexed posture. Follows 1-step commands ~10% of the time, intermittently becoming agitated when OT attempting to assist falling off EOB. Mitts re-applied at end of session. AMPAC 8/24 today, demonstrating functional regression from prior visits. Updating recommendation to post-acute rehab (< 3 hours/day); will continue to follow.      If plan is discharge home, recommend the following:  Two people to help with walking and/or transfers;A lot of help with bathing/dressing/bathroom;Assistance with cooking/housework;Direct supervision/assist for medications management;Direct supervision/assist for financial management;Assist for transportation;Help with stairs or ramp for entrance   Equipment Recommendations  Other (comment) (defer)    Recommendations for Other Services      Precautions / Restrictions Precautions Precautions: Fall;Other (comment) Recall of Precautions/Restrictions: Impaired Precaution/Restrictions Comments: SBP < 160, b/l mitts, NGT Restrictions Weight Bearing Restrictions Per Provider Order: No       Mobility Bed Mobility Overal bed mobility: Needs Assistance Bed  Mobility: Rolling, Sidelying to Sit, Sit to Supine Rolling: Max assist, +2 for physical assistance Sidelying to sit: Total assist, +2 for physical assistance   Sit to supine: Max assist, +2 for physical assistance   General bed mobility comments: pt instinctively reaching for bed rail while rolling to the L, poor initiation of remainder of bed mobility, limited by behaviors & lethargy    Transfers Overall transfer level: Needs assistance Equipment used: 2 person hand held assist Transfers: Sit to/from Stand Sit to Stand: Total assist, +2 physical assistance           General transfer comment: Poor stance, B knees blocked, flexed posture of trunk, hips, knees, cues for forward gaze with poor improvement/volitional movement     Balance Overall balance assessment: Needs assistance Sitting-balance support: Single extremity supported, Feet supported Sitting balance-Leahy Scale: Poor Sitting balance - Comments: severe anterior lean as pt fatigued, occasional R lateral lean, facilitating BUE support on mattress, pt preferring to use R>L hand for stability. Requirin mod-max A to sustain seated balance.   Standing balance support: Bilateral upper extremity supported Standing balance-Leahy Scale: Zero Standing balance comment: heavily reliant on therapists to maintain stance                           ADL either performed or assessed with clinical judgement   ADL Overall ADL's : Needs assistance/impaired Eating/Feeding: Total assistance Eating/Feeding Details (indicate cue type and reason): Cortrak in place             Upper Body Dressing : Maximal assistance           Toileting- Clothing Manipulation and Hygiene: Total assistance              Extremity/Trunk Assessment Upper Extremity Assessment LUE Deficits / Details: difficult to assess  2/2 cognition/behaviors during session            Vision       Perception     Praxis     Communication  Communication Communication: Impaired Factors Affecting Communication: Reduced clarity of speech;Difficulty expressing self (intermittently verbally communicative)   Cognition Arousal: Lethargic Behavior During Therapy: Flat affect, Agitated (fluctuating mood) Cognition: Cognition impaired   Orientation impairments: Situation, Time, Place (re-orientation provided) Awareness: Intellectual awareness impaired   Attention impairment (select first level of impairment): Focused attention Executive functioning impairment (select all impairments): Initiation, Sequencing, Reasoning, Problem solving OT - Cognition Comments: pt with fluctuations in increasin agitation as session progressed and as therapist attempting to maintain pt safety/prevent falling forward off EOB; intermittently swatting at OT with R elbow                 Following commands: Impaired Following commands impaired: Follows one step commands inconsistently (~10% of the time)      Cueing   Cueing Techniques: Verbal cues, Tactile cues, Visual cues, Gestural cues  Exercises Other Exercises Other Exercises: Facilitating chair slides with LUE - AAROM while engaging core and RUE for trunk support. Cognitive cueing for sustaining attention and following along.    Shoulder Instructions       General Comments      Pertinent Vitals/ Pain       Pain Assessment Pain Assessment: Faces Faces Pain Scale: No hurt  Home Living                                          Prior Functioning/Environment              Frequency  Min 2X/week        Progress Toward Goals  OT Goals(current goals can now be found in the care plan section)  Progress towards OT goals: Progressing toward goals     Plan      Co-evaluation    PT/OT/SLP Co-Evaluation/Treatment: Yes Reason for Co-Treatment: Complexity of the patient's impairments (multi-system involvement);Necessary to address cognition/behavior during  functional activity;For patient/therapist safety;To address functional/ADL transfers PT goals addressed during session: Mobility/safety with mobility;Balance OT goals addressed during session: ADL's and self-care;Strengthening/ROM      AM-PAC OT 6 Clicks Daily Activity     Outcome Measure   Help from another person eating meals?: Total Help from another person taking care of personal grooming?: A Lot Help from another person toileting, which includes using toliet, bedpan, or urinal?: Total Help from another person bathing (including washing, rinsing, drying)?: Total Help from another person to put on and taking off regular upper body clothing?: A Lot Help from another person to put on and taking off regular lower body clothing?: Total 6 Click Score: 8    End of Session    OT Visit Diagnosis: Unsteadiness on feet (R26.81);Other abnormalities of gait and mobility (R26.89);Muscle weakness (generalized) (M62.81)   Activity Tolerance Patient limited by lethargy   Patient Left in bed;with call bell/phone within reach;with bed alarm set;with restraints reapplied   Nurse Communication          Time: 8876-8851 OT Time Calculation (min): 25 min  Charges: OT General Charges $OT Visit: 1 Visit OT Treatments $Self Care/Home Management : 8-22 mins  Shreyansh Tiffany D., MSOT, OTR/L Acute Rehabilitation Services [phone] Secure Chat Preferred  [name] 01/17/2024, 1:41 PM

## 2024-01-17 NOTE — Progress Notes (Addendum)
 Pt getting agitated. Used right hand to hit staff. 5mg  zyprexa  iv administered.   Amado GORMAN Arabia, RN

## 2024-01-17 NOTE — Progress Notes (Signed)
    Providing Compassionate, Quality Care - Together   NEUROSURGERY PROGRESS NOTE     S: No issues overnight.    O: EXAM:  BP 136/84 (BP Location: Right Arm)   Pulse 75   Temp 98 F (36.7 C)   Resp 20   Ht 5' 5 (1.651 m)   Wt 55.8 kg   LMP 12/24/2015 (Within Days)   SpO2 96%   BMI 20.47 kg/m     Awake, alert Answers simple questions MAEs, some LUE neglect    ASSESSMENT:  63 y.o. with IPH w/ IVH, s/p EVD removal 10/3 doing well    PLAN: -Continue POC per primary team -NSGY will sign off.  -Call w/ questions/concerns.   Camie Pickle, [facility]

## 2024-01-18 DIAGNOSIS — I610 Nontraumatic intracerebral hemorrhage in hemisphere, subcortical: Secondary | ICD-10-CM | POA: Diagnosis not present

## 2024-01-18 LAB — GLUCOSE, CAPILLARY
Glucose-Capillary: 114 mg/dL — ABNORMAL HIGH (ref 70–99)
Glucose-Capillary: 139 mg/dL — ABNORMAL HIGH (ref 70–99)
Glucose-Capillary: 140 mg/dL — ABNORMAL HIGH (ref 70–99)
Glucose-Capillary: 161 mg/dL — ABNORMAL HIGH (ref 70–99)
Glucose-Capillary: 165 mg/dL — ABNORMAL HIGH (ref 70–99)
Glucose-Capillary: 169 mg/dL — ABNORMAL HIGH (ref 70–99)

## 2024-01-18 NOTE — Progress Notes (Signed)
 Speech Language Pathology Treatment: Dysphagia  Patient Details Name: Amanda [name] MRN: [id number] DOB: Jul 05, 1960 Today's Date: 01/18/2024 Time: 1521-1530 SLP Time Calculation (min) (ACUTE ONLY): 9 min  Assessment / Plan / Recommendation Clinical Impression  Pt alert initially during session, talking and singing; cues needed to focus and sustain attention in order to follow basic commands.  Pt accepted several sips of water  and several spoons of pudding with adequate toleration, then closed her eyes and no longer participated. Nurse reports limited intake continues; cortrak remains and soft mitt restraints are in place.   SLP will assess for readiness to advance diet again - sustained arousal is primary obstacle to PO intake.   HPI HPI: [name] with polysubstance/cocaine abuse 3 hrs before presentation, who called EMS for AMS, lethargy and near syncope. Her condition deteriorated (unable to protect her airway and more lethargic) and got intubated. 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 HTN, CHF, DM-2.      SLP Plan  Continue with current plan of care          Recommendations  Diet recommendations: Other(comment) (full liquids) Liquids provided via: Straw Medication Administration: Whole meds with liquid Supervision: Full supervision/cueing for compensatory strategies Compensations: Slow rate;Small sips/bites Postural Changes and/or Swallow Maneuvers: Seated upright 90 degrees                  Oral care BID   Frequent or constant Supervision/Assistance Dysphagia, unspecified (R13.10)     Continue with current plan of care   Amanda [name] L. Vona, MA CCC/SLP Clinical Specialist - Acute Care SLP Acute Rehabilitation Services Office number [phone]   Amanda [name] Amanda [name]  01/18/2024, 3:35 PM

## 2024-01-18 NOTE — TOC Progression Note (Signed)
 Transition of Care [facility]) - Progression Note    Patient Details  Name: QUANDA PAVLICEK MRN: [id number] Date of Birth: Apr 19, 1960  Transition of [facility]) CM/SW Contact  Almarie CHRISTELLA Goodie, [place] Phone Number: 01/18/2024, 11:02 AM  Clinical Narrative:   CSW following for SNF workup when medically stable. Workup ongoing at this time.    Expected Discharge Plan: Skilled Nursing Facility Barriers to Discharge: English as a second language teacher, Continued Medical Work up, Inadequate or no insurance, Facility will not accept until restraint criteria met, SNF Pending bed offer               Expected Discharge Plan and Services In-house Referral: Clinical Social Work   Post Acute Care Choice: Skilled Nursing Facility Living arrangements for the past 2 months: Single Family Home                                       Social Drivers of Health (SDOH) Interventions SDOH Screenings   Food Insecurity: No Food Insecurity (07/16/2023)   Received from [facility]  Housing: Low Risk  (07/16/2023)   Received from [facility]  Transportation Needs: No Transportation Needs (07/16/2023)   Received from [facility]  Utilities: Not At Risk (07/16/2023)   Received from [facility]  Alcohol Screen: Low Risk  (05/14/2023)  Financial Resource Strain: Low Risk  (07/16/2023)   Received from [facility]  Social Connections: Unknown (10/19/2022)   Received from [facility]  Tobacco Use: High Risk (01/03/2024)    Readmission Risk Interventions    03/26/2022   10:50 AM  Readmission Risk Prevention Plan  Transportation Screening Complete  HRI or Home Care Consult Complete  Social Work Consult for Recovery Care Planning/Counseling Complete  Palliative Care Screening Not Applicable  Medication Review Oceanographer) Referral to Pharmacy

## 2024-01-18 NOTE — Progress Notes (Signed)
 PROGRESS NOTE    [name]  FMW:[id number] DOB: 07-03-1960 DOA: 01/03/2024 PCP: Patient, No Pcp Per     Brief Narrative:  Amanda [name] is a 63 yr old female patient with HTN, CHF (systolic and diastolic, EF 30%, G3DD, RVSP 50.3 mmHg), DM-2, and polysubstance/cocaine abuse 3 hrs before presentation, who called EMS for AMS, lethargy and near syncope. Her BP in ED 180/120, repeat BP 220/126. She was oriented to person only. Her condition deteriorated (unable to protect her airway and more lethargic) and got intubated. Clevidipine  was started for BP control and Propofol  for sedation.   9/22: Admitted to ICU.  Intubated in ED.  Right frontal ventriculostomy by Dr. Debby 9/23: Extubated 9/24: Cortrak placed for nutrition  9/30: Fevers, workup ordered 10/1: EVD clamped 10/3: EVD removed 10/4: TRH assumed care  New events last 24 hours / Subjective: Patient alert to voice and calm and interactive this morning.  Her mentation has waxed and waned during her hospital stay however, ranging from calm and interactive to agitated and fighting.  She has required soft restraints at times.  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  Remains on tube feed through cortrak until p.o. sufficient  Acute metabolic encephalopathy - In setting of above - Atarax  as needed for anxiety, Zyprexa  as needed for agitation, Seroquel  twice daily  Chronic  combined systolic and diastolic CHF HTN - Entresto , Aldactone , Coreg , Norvasc    Diabetes type 2 - A1c 6.6 - Sliding scale insulin   Polysubstance abuse/cocaine abuse - Cessation counseling  Right apical pleural scarring versus density - Need repeat CT chest in 3 months  Severe malnutrition - Estimated body mass index is 20.14 kg/m as calculated from the following:   Height as of this encounter: 5' 5 (1.651 m).   Weight as of this encounter: 54.9 kg. - Temporal muscle wasting seen - Continue TF    DVT prophylaxis:  SCD's Start: 01/11/24 2301 heparin  injection 5,000 Units Start: 01/05/24 1015 Place and maintain sequential compression device Start: 01/03/24 0611 SCDs Start: 01/03/24 0429  Code Status: Full code Family Communication: None at bedside Disposition Plan: SNF placement, ?LTACH candidate  Status is: Inpatient Remains inpatient appropriate because: Need increase PO intake, remains on cortrak     Antimicrobials:  Anti-infectives (From admission, onward)    Start     Dose/Rate Route Frequency Ordered Stop   01/13/24 0600  ceFAZolin (ANCEF) IVPB 2g/100 mL premix        2 g 200 mL/hr over 30 Minutes Intravenous On call to O.R. 01/11/24 2300 01/13/24 0557        Objective: Vitals:   01/17/24 2321 01/18/24 0330 01/18/24 0407 01/18/24 0748  BP: 130/78  (!) 151/83 (!) 152/86  Pulse: 72  75 80  Resp: 18  20 16   Temp: (!) 97.5 F (36.4 C)  97.6 F (36.4 C) 97.9 F (36.6 C)  TempSrc: Axillary  Axillary Oral  SpO2: 96%  94% 98%  Weight:  54.9 kg    Height:        Intake/Output Summary (Last 24 hours) at 01/18/2024 1131 Last data filed at 01/17/2024 2022 Gross per 24 hour  Intake 120 ml  Output 200 ml  Net -80 ml   Filed Weights   01/16/24 0500 01/17/24 0500 01/18/24 0330  Weight: 57.2 kg 55.8 kg 54.9 kg   Examination: General exam: Appears calm and comfortable  Respiratory system: Clear to auscultation. Respiratory effort normal. Cardiovascular  system: S1 & S2 heard, RRR. No pedal edema. Gastrointestinal system: Abdomen is nondistended, soft and nontender. Normal bowel sounds heard. Central nervous system: Alert  Extremities: Symmetric in appearance bilaterally  Skin: No rashes, lesions or ulcers on exposed skin     Data Reviewed: I have personally reviewed following labs and imaging studies  CBC: Recent Labs  Lab 01/15/24 0623  WBC 8.9  NEUTROABS 6.0  HGB 13.1  HCT 39.7  MCV 88.0  PLT 408*   Basic Metabolic Panel: Recent Labs  Lab 01/15/24 0623  NA 135  K 4.6  CL 103  CO2 21*  GLUCOSE 130*  BUN 37*  CREATININE 0.99  CALCIUM 9.3  MG 2.2  PHOS 4.2   GFR: Estimated Creatinine Clearance: 51.1 mL/min (by C-G formula based on SCr of 0.99 mg/dL). Liver Function Tests: No results for input(s): AST, ALT, ALKPHOS, BILITOT, PROT, ALBUMIN in the last 168 hours. No results for input(s): LIPASE, AMYLASE in the last 168 hours. No results for input(s): AMMONIA in the last 168 hours. Coagulation Profile: No results for input(s): INR, PROTIME in the last 168 hours. Cardiac Enzymes: No results for input(s): CKTOTAL, CKMB, CKMBINDEX, TROPONINI in the last 168 hours. BNP (last 3 results) No results for input(s): PROBNP in the last 8760 hours. HbA1C: No results for input(s): HGBA1C in the last 72 hours. CBG: Recent Labs  Lab 01/17/24 1540 01/17/24 1959 01/17/24 2324 01/18/24 0331 01/18/24 0759  GLUCAP 175* 158* 198* 165* 140*   Lipid Profile: No results for input(s): CHOL, HDL, LDLCALC, TRIG, CHOLHDL, LDLDIRECT in the last 72 hours. Thyroid Function Tests: No results for input(s): TSH, T4TOTAL, FREET4, T3FREE, THYROIDAB in the last 72 hours. Anemia Panel: No results for input(s): VITAMINB12, FOLATE, FERRITIN, TIBC, IRON, RETICCTPCT in the last 72 hours. Sepsis Labs: Recent Labs  Lab 01/11/24 2015  PROCALCITON 0.12    Recent Results (from  the past 240 hours)  Culture, blood (Routine X 2) w Reflex to ID Panel     Status: None   Collection Time: 01/11/24  8:15 PM   Specimen: BLOOD  Result Value Ref Range Status   Specimen Description BLOOD SITE NOT SPECIFIED  Final   Special Requests   Final    BOTTLES DRAWN AEROBIC ONLY Blood Culture adequate volume   Culture   Final    NO GROWTH 5 DAYS Performed at [facility], [address] [place], [place] [address]    Report Status 01/16/2024 FINAL  Final  Culture, blood (Routine X 2) w Reflex to ID Panel     Status: None   Collection Time: 01/11/24  8:22 PM   Specimen: BLOOD  Result Value Ref Range Status   Specimen Description BLOOD SITE NOT SPECIFIED  Final   Special Requests   Final    BOTTLES DRAWN AEROBIC ONLY Blood Culture adequate volume   Culture   Final    NO GROWTH 5 DAYS Performed at [facility], [address] [place], [place]  [address]    Report Status 01/16/2024 FINAL  Final      Radiology Studies: No results found.    Scheduled Meds:  amLODipine   10 mg Oral Daily   carvedilol   25 mg Oral BID WC   feeding supplement (PROSource TF20)  60 mL Per Tube Daily   heparin  injection (subcutaneous)  5,000 Units Subcutaneous Q8H   insulin  aspart  0-9 Units Subcutaneous Q4H   mouth rinse  15 mL Mouth Rinse 4 times per day   QUEtiapine   25 mg Oral QHS   sacubitril -valsartan   1 tablet Oral BID   spironolactone   25 mg Oral Daily   Continuous Infusions:  feeding supplement (OSMOLITE 1.5 CAL) 1,000 mL (01/17/24 2343)     LOS: 15 days   Time spent: 25 minutes   Delon Hoe, DO [facility] 01/18/2024, 11:31 AM   Available via Epic secure chat 7am-7pm After these hours, please refer to coverage provider listed on [email or web address]

## 2024-01-18 NOTE — Plan of Care (Signed)
 Assisted with incontinence. Tried giving food by mouth. Meds with applesauce taken. Drinking water  from straw.    Problem: Safety: Goal: Non-violent Restraint(s) Outcome: Progressing   Problem: Nutritional: Goal: Maintenance of adequate nutrition will improve Outcome: Progressing   Problem: Pain Managment: Goal: General experience of comfort will improve and/or be controlled Outcome: Progressing   Problem: Skin Integrity: Goal: Risk for impaired skin integrity will decrease Outcome: Progressing

## 2024-01-19 ENCOUNTER — Inpatient Hospital Stay (HOSPITAL_COMMUNITY)

## 2024-01-19 DIAGNOSIS — I10 Essential (primary) hypertension: Secondary | ICD-10-CM

## 2024-01-19 DIAGNOSIS — F1410 Cocaine abuse, uncomplicated: Secondary | ICD-10-CM | POA: Diagnosis not present

## 2024-01-19 DIAGNOSIS — I5042 Chronic combined systolic (congestive) and diastolic (congestive) heart failure: Secondary | ICD-10-CM

## 2024-01-19 DIAGNOSIS — I619 Nontraumatic intracerebral hemorrhage, unspecified: Secondary | ICD-10-CM | POA: Diagnosis not present

## 2024-01-19 DIAGNOSIS — I161 Hypertensive emergency: Secondary | ICD-10-CM

## 2024-01-19 DIAGNOSIS — I610 Nontraumatic intracerebral hemorrhage in hemisphere, subcortical: Secondary | ICD-10-CM | POA: Diagnosis not present

## 2024-01-19 DIAGNOSIS — R0682 Tachypnea, not elsewhere classified: Secondary | ICD-10-CM

## 2024-01-19 LAB — GLUCOSE, CAPILLARY
Glucose-Capillary: 131 mg/dL — ABNORMAL HIGH (ref 70–99)
Glucose-Capillary: 141 mg/dL — ABNORMAL HIGH (ref 70–99)
Glucose-Capillary: 132 mg/dL — ABNORMAL HIGH (ref 70–99)
Glucose-Capillary: 137 mg/dL — ABNORMAL HIGH (ref 70–99)
Glucose-Capillary: 138 mg/dL — ABNORMAL HIGH (ref 70–99)

## 2024-01-19 LAB — BASIC METABOLIC PANEL WITH GFR
Anion gap: 12 (ref 5–15)
BUN: 32 mg/dL — ABNORMAL HIGH (ref 8–23)
CO2: 22 mmol/L (ref 22–32)
Calcium: 9.3 mg/dL (ref 8.9–10.3)
Chloride: 103 mmol/L (ref 98–111)
Creatinine, Ser: 0.83 mg/dL (ref 0.44–1.00)
GFR, Estimated: >60 mL/min (ref >60)
Glucose, Bld: 145 mg/dL — ABNORMAL HIGH (ref 70–99)
Potassium: 4.5 mmol/L (ref 3.5–5.1)
Sodium: 137 mmol/L (ref 135–145)

## 2024-01-19 NOTE — Hospital Course (Addendum)
 Amanda [name] is a 63 y.o. female with a history of hypertension, heart failure, cocaine abuse,d diabetes mellitus type 2. Patient presented secondary to lethargy and near syncope, found to have significantly elevated blood pressure and CT evidence of acute intraparenchymal hemorrhage with intraventricular hemorrhage and hydrocephalus. Patient admitted to ICU for close blood pressure management and Neurosurgery consulted, performing a right frontal ventriculostomy. EVD eventually removed on 10/3.  PEG tube required on 10/16 and removed on 12/2.

## 2024-01-19 NOTE — Progress Notes (Addendum)
 PROGRESS NOTE    Amanda [name]  FMW:[id number] DOB: [birth year] DOA: 01/03/2024 PCP: Patient, No Pcp Per   Brief Narrative: Amanda [name] is a 63 y.o. female with a history of hypertension, heart failure, cocaine abuse,d diabetes mellitus type 2.  Patient presented secondary to lethargy and near syncope, found to have significantly elevated blood pressure and CT evidence of acute intraparenchymal hemorrhage with intraventricular hemorrhage and hydrocephalus. Patient admitted to ICU for close blood pressure management and Neurosurgery consulted, performing a right frontal ventriculostomy. EVD eventually removed on 10/3.   Assessment and Plan:  Acute right thalamic intraparenchymal hemorrhage Intraventricular hemorrhage Obstructive hydrocephalus In setting of cocaine use. 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 Neurosurgery consulted while the patient was in the ED, with neurosurgery recommendation for no emergent placement of a ventricular drain. Patient intubated and transferred to the ICU on admission. Blood pressure management with Cleviprex . Repeat CT head significant for increase in size of medial right thalamic hematoma. Patient underwent right frontal ventriculostomy on 9/22 which was eventual clamped on 10/1 and removed on 10/3.   Acute metabolic encephalopathy Present on presentation and related to hydrocephalus and IVH. Waxing and waning.  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  -Continue Coreg , spironolactone  and Entresto   Acute respiratory failure with hypoxia Patient with worsening mental status and concern for inability to protect her airway on admission. Patient was intubated successfully on 9/22 and managed via mechanical ventilation. Patient extubated successfully on 9/23. On room air.  Tachypnea Unclear etiology. Per patient, no dyspnea.  Possibly related to aspiration in setting of dysphagia and tube feeds. -Continuous pulse ox -Chest x-ray  Primary hypertension Hypertensive emergency Hypertensive emergency in setting of stroke and severely elevated blood pressure. Patient managed on Cleviprex  drip while in ICU. Cleviprex  weaned off. -Continue amlodipine , Coreg , spironolactone  and Entresto   Diabetes mellitus type 2 Well controlled based on hemoglobin A1C of 6.6%. -Continue SSI  Cocaine abuse Noted. Patient with a history of cocaine use, most recently 3 hours prior to presentation. Cocaine positive on UDS.  Abnormal CT chest Imaging significant for right apical pleural scarring versus density. Recommendation for repeat CT scan in 3 months from 01/03/2024.  Moderate malnutrition Diagnosis per dietitian assessment on 10/1. -Dietitian recommendations (10/1): Continue tube feeding via Cortrak tube: Osmolite 1.5 at 40 ml/h (960 ml per day) Prosource TF20 60 ml daily Provides 1520 kcal, 80 gm protein, 731 ml free water  daily Continue thiamine  daily Encourage PO intake as able   DVT prophylaxis: Heparin  subq Code Status:   Code Status: Full Code Family Communication: None at bedside Disposition Plan: Discharge to SNF pending bed availability and increased nutrition intake   Consultants:  Neurology PCCM Neurosurgery  Procedures:  Right frontal ventriculostomy Cortrak placement  Antimicrobials: None    Subjective: Patient not answering my questions.  Objective: BP (!) 148/88 (BP Location: Right Arm)   Pulse 80   Temp 98.9 F (37.2 C) (Oral)   Resp 19   Ht 5' 5 (1.651 m)   Wt 54.9 kg   LMP 12/24/2015 (Within Days)   SpO2 92%   BMI 20.14 kg/m   Examination:  General exam: Appears calm and comfortable. Respiratory system: Clear to auscultation. Tachypnea. Cardiovascular system: S1 & S2 heard, RRR. No murmur. Gastrointestinal system: Abdomen is nondistended, soft and nontender. Normal bowel  sounds heard. Central nervous system: Alert. Responds to her name. Musculoskeletal: No edema. No calf tenderness  Data Reviewed: I have personally reviewed following labs and imaging studies  CBC Lab Results  Component Value Date   WBC 8.9 01/15/2024   RBC 4.51 01/15/2024   HGB 13.1 01/15/2024   HCT 39.7 01/15/2024   MCV 88.0 01/15/2024   MCH 29.0 01/15/2024   PLT 408 (H) 01/15/2024   MCHC 33.0 01/15/2024   RDW 13.5 01/15/2024   LYMPHSABS 1.8 01/15/2024   MONOABS 0.9 01/15/2024   EOSABS 0.0 01/15/2024   BASOSABS 0.0 01/15/2024     Last metabolic panel Lab Results  Component Value Date   NA 137 01/19/2024   K 4.5 01/19/2024   CL 103 01/19/2024   CO2 22 01/19/2024   BUN 32 (H) 01/19/2024   CREATININE 0.83 01/19/2024   GLUCOSE 145 (H) 01/19/2024   GFRNONAA >60 01/19/2024   GFRAA >90 01/21/2014   CALCIUM 9.3 01/19/2024   PHOS 4.2 01/15/2024   PROT 7.0 01/03/2024   ALBUMIN 2.9 (L) 01/03/2024   LABGLOB 3.6 11/15/2023   BILITOT 0.6 01/03/2024   ALKPHOS 66 01/03/2024   AST 24 01/03/2024   ALT 14 01/03/2024   ANIONGAP 12 01/19/2024    GFR: Estimated Creatinine Clearance: 60.9 mL/min (by C-G formula based on SCr of 0.83 mg/dL).  Recent Results (from the past 240 hours)  Culture, blood (Routine X 2) w Reflex to ID Panel     Status: None   Collection Time: 01/11/24  8:15 PM   Specimen: BLOOD  Result Value Ref Range Status   Specimen Description BLOOD SITE NOT SPECIFIED  Final   Special Requests   Final    BOTTLES DRAWN AEROBIC ONLY Blood Culture adequate volume   Culture   Final    NO GROWTH 5 DAYS Performed at [facility], [address], [place], [place] [address]    Report Status 01/16/2024 FINAL  Final  Culture, blood (Routine X 2) w Reflex to ID Panel     Status: None   Collection Time: 01/11/24  8:22 PM   Specimen: BLOOD  Result Value Ref Range Status   Specimen Description BLOOD SITE NOT SPECIFIED  Final   Special Requests   Final    BOTTLES  DRAWN AEROBIC ONLY Blood Culture adequate volume   Culture   Final    NO GROWTH 5 DAYS Performed at [facility], [address] [place], [place] [address]    Report Status 01/16/2024 FINAL  Final      Radiology Studies: No results found.    LOS: 16 days    Elgin Lam, MD [facility] 01/19/2024, 2:48 PM   If 7PM-7AM, please contact night-coverage [email or web address]

## 2024-01-19 NOTE — Plan of Care (Signed)
  Problem: Safety: Goal: Non-violent Restraint(s) Outcome: Progressing   Problem: Education: Goal: Ability to describe self-care measures that may prevent or decrease complications (Diabetes Survival Skills Education) will improve Outcome: Progressing   Problem: Coping: Goal: Ability to adjust to condition or change in health will improve Outcome: Progressing   Problem: Fluid Volume: Goal: Ability to maintain a balanced intake and output will improve Outcome: Progressing   Problem: Health Behavior/Discharge Planning: Goal: Ability to identify and utilize available resources and services will improve Outcome: Progressing Goal: Ability to manage health-related needs will improve Outcome: Progressing

## 2024-01-19 NOTE — Progress Notes (Signed)
 Nutrition Follow-up  DOCUMENTATION CODES:  Non-severe (moderate) malnutrition in context of social or environmental circumstances (polysubstance abuse)  INTERVENTION:  Continue tube feeding via cortrak: Osmolite 1.5 at 40 ml/h (960 ml per day) Prosource TF20 60 ml 1x/d Free water : 30mL every 4 hours Provides 1520 kcal, 80 gm protein, 731 ml free water  daily (911mL flush + TF) Continue thiamine  daily 100mg  daily Continue current diet as ordered Encourage PO intake as able    NUTRITION DIAGNOSIS:  Moderate Malnutrition related to social / environmental circumstances (polysubstance abuse) as evidenced by mild muscle depletion, moderate fat depletion, mild fat depletion, moderate muscle depletion. - remains applicable  GOAL:  Patient will meet greater than or equal to 90% of their needs - progressing  MONITOR:  TF tolerance, Vent status, Labs  REASON FOR ASSESSMENT:  Consult Enteral/tube feeding initiation and management  ASSESSMENT:  Pt with hx HTN, CHF, type 2 diabetes, polysubstance abuse, and pulmonary edema. Admitted with AMS, lethargy, and deteriorating condition requiring intubation; diagnosed ICH.  9/22 - admitted, intubated, EVD placed 9/23 - extubated 9/24 - s/p cortrak placement; tip gastric  9/25 - Diet advanced to Dysphagia 2/thin; TF stopped 9/26 - back down to Full liquids, TF resumed   Pt resting in bed at the time of assessment. Opens her eyes but does not interact or answer questions. TF infusing at goal rate and appear to be well tolerated. Per SLP is able to have full liquids when fully awake, but at this time not taking in adequate PO.   Mittens still in place. Will continue TF as ordered and monitor for an improvement in mentation and PO intake to adjust.   Admit weight: 56.1 kg  Current weight: 54.9 kg    Average Meal Intake: 10/6-10/8: 0% intake x 4 recorded meals  Nutritionally Relevant Medications: Scheduled Meds:  PROSource TF20  60 mL Per  Tube Daily   insulin  aspart  0-9 Units Subcutaneous Q4H   spironolactone   25 mg Oral Daily   Continuous Infusions:  feeding supplement (OSMOLITE 1.5 CAL) 1,000 mL (01/19/24 0244)   PRN Meds: polyethylene glycol  Labs Reviewed: CBG ranges from 114-169 mg/dL over the last 24 hours HgbA1c 6.6%  NUTRITION - FOCUSED PHYSICAL EXAM: Flowsheet Row Most Recent Value  Orbital Region Mild depletion  Upper Arm Region Unable to assess  Thoracic and Lumbar Region Moderate depletion  Buccal Region Unable to assess  Temple Region Moderate depletion  Clavicle Bone Region Moderate depletion  Clavicle and Acromion Bone Region Moderate depletion  Scapular Bone Region Unable to assess  Dorsal Hand Mild depletion  Patellar Region Mild depletion  Anterior Thigh Region Mild depletion  Posterior Calf Region Mild depletion  Edema (RD Assessment) None  Hair Reviewed  Eyes Unable to assess  Mouth Unable to assess  Skin Reviewed  Nails Reviewed    Diet Order:   Diet Order             Diet full liquid Room service appropriate? Yes with Assist; Fluid consistency: Thin  Diet effective now                  EDUCATION NEEDS:  Not appropriate for education at this time  Skin:  Skin Assessment: Reviewed RN Assessment  Last BM:  10/9 - type 6  Height:  Ht Readings from Last 1 Encounters:  01/03/24 5' 5 (1.651 m)    Weight:  Wt Readings from Last 1 Encounters:  01/18/24 54.9 kg    Ideal Body Weight:  56.8 kg  BMI:  Body mass index is 20.14 kg/m.  Estimated Nutritional Needs:  Kcal:  1500-1700 Protein:  80-90 grams Fluid:  1.5-1.7L    Vernell Lukes, RD, LDN, CNSC Registered Dietitian II Please reach out via secure chat

## 2024-01-19 NOTE — Plan of Care (Signed)
  Problem: Fluid Volume: Goal: Ability to maintain a balanced intake and output will improve Outcome: Progressing   Problem: Health Behavior/Discharge Planning: Goal: Ability to manage health-related needs will improve Outcome: Progressing   Problem: Nutritional: Goal: Maintenance of adequate nutrition will improve Outcome: Progressing   Problem: Skin Integrity: Goal: Risk for impaired skin integrity will decrease Outcome: Progressing   Problem: Activity: Goal: Risk for activity intolerance will decrease Outcome: Progressing   Problem: Nutrition: Goal: Adequate nutrition will be maintained Outcome: Progressing   Problem: Coping: Goal: Level of anxiety will decrease Outcome: Progressing   Problem: Skin Integrity: Goal: Risk for impaired skin integrity will decrease Outcome: Progressing   Problem: Safety: Goal: Ability to remain free from injury will improve Outcome: Progressing

## 2024-01-20 ENCOUNTER — Inpatient Hospital Stay (HOSPITAL_COMMUNITY)

## 2024-01-20 DIAGNOSIS — I610 Nontraumatic intracerebral hemorrhage in hemisphere, subcortical: Secondary | ICD-10-CM | POA: Diagnosis not present

## 2024-01-20 LAB — GLUCOSE, CAPILLARY
Glucose-Capillary: 114 mg/dL — ABNORMAL HIGH (ref 70–99)
Glucose-Capillary: 123 mg/dL — ABNORMAL HIGH (ref 70–99)
Glucose-Capillary: 135 mg/dL — ABNORMAL HIGH (ref 70–99)
Glucose-Capillary: 138 mg/dL — ABNORMAL HIGH (ref 70–99)
Glucose-Capillary: 161 mg/dL — ABNORMAL HIGH (ref 70–99)
Glucose-Capillary: 168 mg/dL — ABNORMAL HIGH (ref 70–99)

## 2024-01-20 NOTE — Progress Notes (Signed)
 PROGRESS NOTE    Amanda [name]  FMW:[id number] DOB: 05-22-1960 DOA: 01/03/2024 PCP: Patient, No Pcp Per   Brief Narrative: Amanda [name] is a 63 y.o. female with a history of hypertension, heart failure, cocaine abuse,d diabetes mellitus type 2.  Patient presented secondary to lethargy and near syncope, found to have significantly elevated blood pressure and CT evidence of acute intraparenchymal hemorrhage with intraventricular hemorrhage and hydrocephalus. Patient admitted to ICU for close blood pressure management and Neurosurgery consulted, performing a right frontal ventriculostomy. EVD eventually removed on 10/3.   Assessment and Plan:  Acute right thalamic intraparenchymal hemorrhage Intraventricular hemorrhage Obstructive hydrocephalus In setting of cocaine use. 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 Neurosurgery consulted while the patient was in the ED, with neurosurgery recommendation for no emergent placement of a ventricular drain. Patient intubated and transferred to the ICU on admission. Blood pressure management with Cleviprex . Repeat CT head significant for increase in size of medial right thalamic hematoma. Patient underwent right frontal ventriculostomy on 9/22 which was eventual clamped on 10/1 and removed on 10/3.   Acute metabolic encephalopathy Present on presentation and related to hydrocephalus and IVH. Waxing and waning.  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  -Continue Coreg , spironolactone  and Entresto   Acute respiratory failure with hypoxia Patient with worsening mental status and concern for inability to protect her airway on admission. Patient was intubated successfully on 9/22 and managed via mechanical ventilation. Patient extubated successfully on 9/23. On room air.  Tachypnea Unclear etiology. Per patient, no dyspnea.  Possibly related to aspiration in setting of dysphagia and tube feeds. Chest x-ray unremarkable. Patient remains with SpO2 of 100% on room air. Possibly anxiety related.  Primary hypertension Hypertensive emergency Hypertensive emergency in setting of stroke and severely elevated blood pressure. Patient managed on Cleviprex  drip while in ICU. Cleviprex  weaned off. -Continue amlodipine , Coreg , spironolactone  and Entresto   Diabetes mellitus type 2 Well controlled based on hemoglobin A1C of 6.6%. -Continue SSI  Cocaine abuse Noted. Patient with a history of cocaine use, most recently 3 hours prior to presentation. Cocaine positive on UDS.  Abnormal CT chest Imaging significant for right apical pleural scarring versus density. Recommendation for repeat CT scan in 3 months from 01/03/2024.  Moderate malnutrition Diagnosis per dietitian assessment on 10/1. -Dietitian recommendations (10/1): Continue tube feeding via Cortrak tube: Osmolite 1.5 at 40 ml/h (960 ml per day) Prosource TF20 60 ml daily Provides 1520 kcal, 80 gm protein, 731 ml free water  daily Continue thiamine  daily Encourage PO intake as able   DVT prophylaxis: Heparin  subq Code Status:   Code Status: Full Code Family Communication: None at bedside Disposition Plan: Discharge to SNF pending bed availability and increased nutrition intake   Consultants:  Neurology PCCM Neurosurgery  Procedures:  Right frontal ventriculostomy Cortrak placement  Antimicrobials: None    Subjective: No issues this morning.  Objective: BP (!) 139/93 (BP Location: Right Arm)   Pulse 83   Temp 97.8 F (36.6 C) (Oral)   Resp 19   Ht 5' 5 (1.651 m)   Wt 54.9 kg   LMP 12/24/2015 (Within Days)   SpO2 95%   BMI 20.14 kg/m   Examination:  General exam: Appears calm and comfortable. Respiratory system: Clear to auscultation. Respiratory effort normal. Cardiovascular system: S1 & S2 heard, RRR. No murmur. Gastrointestinal  system: Abdomen is nondistended, soft and nontender. Normal bowel sounds heard. Central nervous system: Alert.  Data Reviewed: I have personally reviewed following labs and imaging studies  CBC Lab Results  Component Value Date   WBC 8.9 01/15/2024   RBC 4.51 01/15/2024   HGB 13.1 01/15/2024   HCT 39.7 01/15/2024   MCV 88.0 01/15/2024   MCH 29.0 01/15/2024   PLT 408 (H) 01/15/2024   MCHC 33.0 01/15/2024   RDW 13.5 01/15/2024   LYMPHSABS 1.8 01/15/2024   MONOABS 0.9 01/15/2024   EOSABS 0.0 01/15/2024   BASOSABS 0.0 01/15/2024     Last metabolic panel Lab Results  Component Value Date   NA 137 01/19/2024   K 4.5 01/19/2024   CL 103 01/19/2024   CO2 22 01/19/2024   BUN 32 (H) 01/19/2024   CREATININE 0.83 01/19/2024   GLUCOSE 145 (H) 01/19/2024   GFRNONAA >60 01/19/2024   GFRAA >90 01/21/2014   CALCIUM 9.3 01/19/2024   PHOS 4.2 01/15/2024   PROT 7.0 01/03/2024   ALBUMIN 2.9 (L) 01/03/2024   LABGLOB 3.6 11/15/2023   BILITOT 0.6 01/03/2024   ALKPHOS 66 01/03/2024   AST 24 01/03/2024   ALT 14 01/03/2024   ANIONGAP 12 01/19/2024    GFR: Estimated Creatinine Clearance: 60.9 mL/min (by C-G formula based on SCr of 0.83 mg/dL).  Recent Results (from the past 240 hours)  Culture, blood (Routine X 2) w Reflex to ID Panel     Status: None   Collection Time: 01/11/24  8:15 PM   Specimen: BLOOD  Result Value Ref Range Status   Specimen Description BLOOD SITE NOT SPECIFIED  Final   Special Requests   Final    BOTTLES DRAWN AEROBIC ONLY Blood Culture adequate volume   Culture   Final    NO GROWTH 5 DAYS Performed at [facility], [address] [place], [place] [address]    Report Status 01/16/2024 FINAL  Final  Culture, blood (Routine X 2) w Reflex to ID Panel     Status: None   Collection Time: 01/11/24  8:22 PM   Specimen: BLOOD  Result Value Ref Range Status   Specimen Description BLOOD SITE NOT SPECIFIED  Final   Special Requests   Final    BOTTLES  DRAWN AEROBIC ONLY Blood Culture adequate volume   Culture   Final    NO GROWTH 5 DAYS Performed at [facility], [address], [place], [place] [address]    Report Status 01/16/2024 FINAL  Final      Radiology Studies: DG CHEST PORT 1 VIEW Result Date: 01/19/2024 CLINICAL DATA:  Tachypnea. EXAM: PORTABLE CHEST 1 VIEW COMPARISON:  01/11/2024. FINDINGS: The heart size and mediastinal contours are unchanged. Feeding tube courses below the left hemidiaphragm, beyond the field of view. Suggestion of increased central interstitial coarsening, which could reflect pulmonary vascular congestion. No focal consolidation, pleural effusion, or pneumothorax. Visualized osseous structures are unchanged. IMPRESSION: Suggestion of increased central interstitial coarsening, which could reflect pulmonary vascular congestion. Otherwise, no significant change from the prior exam. Electronically Signed   By: Harrietta Sherry M.D.   On: 01/19/2024 15:09      LOS: 17 days    Elgin Lam, MD [facility] 01/20/2024, 9:20 AM   If 7PM-7AM, please contact night-coverage [email or web address]

## 2024-01-20 NOTE — Progress Notes (Signed)
 Physical Therapy Treatment Patient Details Name: Amanda [name] MRN: [id number] DOB: [birth year] Today's Date: 01/20/2024   History of Present Illness 63 yo female presents to [facility] on 9/22 with lethargy and near-syncope s/p cocaine and opioid ingestion. 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 S/p R frontal ventriculostomy, burr holes 9/22. ETT 9/22-9/23. PMHx of cocaine abuse, CHF, CKD, chronic cough, HTN and DM2.    PT Comments  Upon arrival, L mitt was off in bed with pt. Confirmed with RN that pt was to wear bil mitts and must have pulled it off. Donned both mitts at end of session. The pt is continuing to functionally decline. The pt kept her eyes open and was able to track bil, but did not follow any commands today. Pt would only state what? to her name but otherwise was nonverbal. She displayed a tendency to push herself to her L with her R UE when sitting EOB. Minimal active movement was noted in the L UE, but it does withdraw to noxious stimuli. Currently, due to poor initiation and command following, the pt is now requiring total assist for all bed mobility and transfers. Pt also often needed max-total assist for static sitting balance. Notified RN and MD of concerns of pt's functional decline, decreased L UE use, increased L lean, increased drooling, and decreased verbal response and ability to follow commands. Her RR was also elevated at 36 today. Will continue to follow acutely.     If plan is discharge home, recommend the following: Two people to help with walking and/or transfers;Two people to help with bathing/dressing/bathroom;Supervision due to cognitive status;Direct supervision/assist for financial management;Assistance with feeding;Direct supervision/assist for medications management;Assist for transportation;Help with stairs or ramp for entrance   Can travel by private vehicle     No   Equipment Recommendations  Hospital bed;Wheelchair (measurements PT);Wheelchair cushion (measurements PT);Hoyer lift;BSC/3in1    Recommendations for Other Services       Precautions / Restrictions Precautions Precautions: Fall;Other (comment) Recall of Precautions/Restrictions: Impaired Precaution/Restrictions Comments: SBP < 160, bil mitt, NGT, pushes to L Restrictions Weight Bearing Restrictions Per Provider Order: No     Mobility  Bed Mobility Overal bed mobility: Needs Assistance Bed Mobility: Sit to Supine, Supine to Sit, Rolling Rolling: Total assist   Supine to sit: Total assist, HOB elevated Sit to supine: Total assist   General bed mobility comments: Pt not following any commands or initiating movement, thereby requiring total assist for all bed mobility aspects. Upon sitting up, pt quickly began to push herself to her L with her R UE    Transfers Overall transfer level: Needs assistance Equipment used: 1 person hand held assist Transfers: Sit to/from Stand, Bed to chair/wheelchair/BSC Sit to Stand: Total assist     Squat pivot transfers: Total assist     General transfer comment: Bil knees blocked and use of pad as sling to power pt up to stand. Pt unable to extend hips and trunk to stand fully upright. Pt with no initiation during transfers, needing total assist to stand and to squat pivot to L towards HOB, x2 reps each.    Ambulation/Gait               General Gait Details: unable   Stairs             Wheelchair Mobility     Tilt Bed    Modified Rankin (Stroke Patients Only) Modified Rankin (Stroke Patients Only) Pre-Morbid  Rankin Score: No symptoms Modified Rankin: Severe disability     Balance Overall balance assessment: Needs assistance Sitting-balance support: Feet supported, Single extremity supported, Bilateral upper extremity supported Sitting balance-Leahy Scale: Zero Sitting balance - Comments: Pt pushes herself to the  L with her R UE. When hands were placed in lap, pt would have immediate LOB posteriorly or laterally to L, needing total assist for sitting balance. When propped on R elbow, she was able to progress to CGA-minA but once sitting back upright pt would lean to the L again and need max-total assist. Postural control: Posterior lean, Left lateral lean Standing balance support: Bilateral upper extremity supported Standing balance-Leahy Scale: Zero Standing balance comment: Total assist to stand with bil knees blocked, pt unable to extend hips and trunk to stand fully upright                            Communication Communication Communication: Impaired Factors Affecting Communication: Reduced clarity of speech;Difficulty expressing self (only stating what? in response to her name today)  Cognition Arousal: Alert Behavior During Therapy: Flat affect   PT - Cognitive impairments: Awareness, Attention, Difficult to assess, Safety/Judgement, Sequencing, Initiation, Problem solving                       PT - Cognition Comments: Pt able to track bil, but not following any commands today. Pt with tendency to push to her L with her R, not aware of her balance issues impacting her safety. Pt only stating what? in response to her name but otherwise nonverbal. Following commands: Impaired Following commands impaired:  (not following any commands)    Cueing Cueing Techniques: Verbal cues, Tactile cues, Visual cues, Gestural cues  Exercises      General Comments General comments (skin integrity, edema, etc.): Notified RN and MD of concerns of pt's functional decline, decreased L UE use, increased L lean, and decreased verbal response and ability to follow commands. RR noted to be 36, SpO2 >/= 92% on RA      Pertinent Vitals/Pain Pain Assessment Pain Assessment: Faces Faces Pain Scale: Hurts a little bit Pain Location: generalized; unable to identify Pain Descriptors /  Indicators: Grimacing Pain Intervention(s): Limited activity within patient's tolerance, Monitored during session, Repositioned    Home Living                          Prior Function            PT Goals (current goals can now be found in the care plan section) Acute Rehab PT Goals Patient Stated Goal: did not state PT Goal Formulation: Patient unable to participate in goal setting Time For Goal Achievement: 01/31/24 Potential to Achieve Goals: Fair Progress towards PT goals: Not progressing toward goals - comment (functional decline)    Frequency    Min 2X/week      PT Plan      Co-evaluation              AM-PAC PT 6 Clicks Mobility   Outcome Measure  Help needed turning from your back to your side while in a flat bed without using bedrails?: Total Help needed moving from lying on your back to sitting on the side of a flat bed without using bedrails?: Total Help needed moving to and from a bed to a chair (including a wheelchair)?: Total Help needed standing up  from a chair using your arms (e.g., wheelchair or bedside chair)?: Total Help needed to walk in hospital room?: Total Help needed climbing 3-5 steps with a railing? : Total 6 Click Score: 6    End of Session   Activity Tolerance: Other (comment) (limited by impaired cognition and command following) Patient left: in bed;with call bell/phone within reach;with bed alarm set;with restraints reapplied Nurse Communication: Mobility status;Other (comment) (Notified RN and MD of concerns of pt's functional decline, decreased L UE use, increased L lean, and decreased verbal response and ability to follow commands.) PT Visit Diagnosis: Unsteadiness on feet (R26.81);Difficulty in walking, not elsewhere classified (R26.2);Other abnormalities of gait and mobility (R26.89);Other symptoms and signs involving the nervous system (R29.898);Muscle weakness (generalized) (M62.81)     Time: 8751-8689 PT Time  Calculation (min) (ACUTE ONLY): 22 min  Charges:    $Therapeutic Activity: 8-22 mins PT General Charges $$ ACUTE PT VISIT: 1 Visit                     Theo Ferretti, PT, DPT Acute Rehabilitation Services  Office: [phone]    Theo CHRISTELLA Ferretti 01/20/2024, 1:33 PM

## 2024-01-20 NOTE — Plan of Care (Signed)
  Problem: Coping: Goal: Ability to adjust to condition or change in health will improve Outcome: Progressing   Problem: Fluid Volume: Goal: Ability to maintain a balanced intake and output will improve Outcome: Progressing   Problem: Health Behavior/Discharge Planning: Goal: Ability to manage health-related needs will improve Outcome: Progressing   Problem: Skin Integrity: Goal: Risk for impaired skin integrity will decrease Outcome: Progressing   Problem: Clinical Measurements: Goal: Will remain free from infection Outcome: Progressing   Problem: Safety: Goal: Ability to remain free from injury will improve Outcome: Progressing   Problem: Skin Integrity: Goal: Risk for impaired skin integrity will decrease Outcome: Progressing

## 2024-01-21 DIAGNOSIS — I610 Nontraumatic intracerebral hemorrhage in hemisphere, subcortical: Secondary | ICD-10-CM | POA: Diagnosis not present

## 2024-01-21 LAB — BLOOD GAS, VENOUS
Acid-Base Excess: 1.1 mmol/L (ref 0.0–2.0)
Bicarbonate: 25.1 mmol/L (ref 20.0–28.0)
Drawn by: 67894
O2 Saturation: 88.5 %
Patient temperature: 36.9
pCO2, Ven: 37 mmHg — ABNORMAL LOW (ref 44–60)
pH, Ven: 7.44 — ABNORMAL HIGH (ref 7.25–7.43)
pO2, Ven: 56 mmHg — ABNORMAL HIGH (ref 32–45)

## 2024-01-21 LAB — BASIC METABOLIC PANEL WITH GFR
Anion gap: 15 (ref 5–15)
BUN: 33 mg/dL — ABNORMAL HIGH (ref 8–23)
CO2: 20 mmol/L — ABNORMAL LOW (ref 22–32)
Calcium: 9.3 mg/dL (ref 8.9–10.3)
Chloride: 104 mmol/L (ref 98–111)
Creatinine, Ser: 0.83 mg/dL (ref 0.44–1.00)
GFR, Estimated: >60 mL/min (ref >60)
Glucose, Bld: 152 mg/dL — ABNORMAL HIGH (ref 70–99)
Potassium: 4.2 mmol/L (ref 3.5–5.1)
Sodium: 139 mmol/L (ref 135–145)

## 2024-01-21 LAB — GLUCOSE, CAPILLARY
Glucose-Capillary: 154 mg/dL — ABNORMAL HIGH (ref 70–99)
Glucose-Capillary: 164 mg/dL — ABNORMAL HIGH (ref 70–99)
Glucose-Capillary: 175 mg/dL — ABNORMAL HIGH (ref 70–99)
Glucose-Capillary: 176 mg/dL — ABNORMAL HIGH (ref 70–99)
Glucose-Capillary: 184 mg/dL — ABNORMAL HIGH (ref 70–99)
Glucose-Capillary: 185 mg/dL — ABNORMAL HIGH (ref 70–99)

## 2024-01-21 LAB — PHOSPHORUS: Phosphorus: 4.1 mg/dL (ref 2.5–4.6)

## 2024-01-21 LAB — D-DIMER, QUANTITATIVE: D-Dimer, Quant: <0.27 ug{FEU}/mL (ref 0.00–0.50)

## 2024-01-21 LAB — MAGNESIUM: Magnesium: 2.3 mg/dL (ref 1.7–2.4)

## 2024-01-21 MED ORDER — SPIRONOLACTONE 25 MG PO TABS
25.0000 mg | ORAL_TABLET | Freq: Every day | ORAL | Status: DC
  Administered 2024-01-22 – 2024-02-11 (×20): 25 mg
  Filled 2024-01-21 (×21): qty 1

## 2024-01-21 MED ORDER — CARVEDILOL 12.5 MG PO TABS
25.0000 mg | ORAL_TABLET | Freq: Two times a day (BID) | ORAL | Status: DC
  Administered 2024-01-21 – 2024-02-11 (×40): 25 mg
  Filled 2024-01-21 (×42): qty 2

## 2024-01-21 MED ORDER — HYDROXYZINE HCL 10 MG PO TABS
10.0000 mg | ORAL_TABLET | Freq: Three times a day (TID) | ORAL | Status: DC | PRN
  Administered 2024-01-22 – 2024-02-08 (×16): 10 mg
  Filled 2024-01-21 (×18): qty 1

## 2024-01-21 MED ORDER — SACUBITRIL-VALSARTAN 97-103 MG PO TABS
1.0000 | ORAL_TABLET | Freq: Two times a day (BID) | ORAL | Status: DC
  Administered 2024-01-21 – 2024-02-11 (×41): 1
  Filled 2024-01-21 (×45): qty 1

## 2024-01-21 MED ORDER — QUETIAPINE FUMARATE 25 MG PO TABS
25.0000 mg | ORAL_TABLET | Freq: Every day | ORAL | Status: DC
  Administered 2024-01-21 – 2024-02-10 (×21): 25 mg
  Filled 2024-01-21 (×21): qty 1

## 2024-01-21 MED ORDER — AMLODIPINE BESYLATE 5 MG PO TABS
10.0000 mg | ORAL_TABLET | Freq: Every day | ORAL | Status: DC
  Administered 2024-01-22 – 2024-02-02 (×11): 10 mg
  Filled 2024-01-21 (×12): qty 2

## 2024-01-21 MED ORDER — POLYETHYLENE GLYCOL 3350 17 G PO PACK
17.0000 g | PACK | Freq: Every day | ORAL | Status: DC | PRN
  Administered 2024-01-30: 17 g
  Filled 2024-01-21: qty 1

## 2024-01-21 NOTE — Plan of Care (Signed)
 Progressing

## 2024-01-21 NOTE — Progress Notes (Signed)
 PROGRESS NOTE    Amanda [name]  FMW:[id number] DOB: 06/13/1960 DOA: 01/03/2024 PCP: Patient, No Pcp Per   Brief Narrative: Amanda [name] is a 63 y.o. female with a history of hypertension, heart failure, cocaine abuse,d diabetes mellitus type 2.  Patient presented secondary to lethargy and near syncope, found to have significantly elevated blood pressure and CT evidence of acute intraparenchymal hemorrhage with intraventricular hemorrhage and hydrocephalus. Patient admitted to ICU for close blood pressure management and Neurosurgery consulted, performing a right frontal ventriculostomy. EVD eventually removed on 10/3.   Assessment and Plan:  Acute right thalamic intraparenchymal hemorrhage Intraventricular hemorrhage Obstructive hydrocephalus In setting of cocaine use. 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 Neurosurgery consulted while the patient was in the ED, with neurosurgery recommendation for no emergent placement of a ventricular drain. Patient intubated and transferred to the ICU on admission. Blood pressure management with Cleviprex . Repeat CT head significant for increase in size of medial right thalamic hematoma. Patient underwent right frontal ventriculostomy on 9/22 which was eventual clamped on 10/1 and removed on 10/3.   Acute metabolic encephalopathy Present on presentation and related to hydrocephalus and IVH. Waxing and waning.  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  -Continue Coreg , spironolactone  and Entresto   Acute respiratory failure with hypoxia Patient with worsening mental status and concern for inability to protect her airway on admission. Patient was intubated successfully on 9/22 and managed via mechanical ventilation. Patient extubated successfully on 9/23. On room air.  Tachypnea Unclear etiology. Per patient, no dyspnea.  Possibly related to aspiration in setting of dysphagia and tube feeds. Chest x-ray unremarkable. Patient remains with SpO2 of 100% on room air. Possibly anxiety related. -Check VBG -Pending VBG, will consider chest CT  Primary hypertension Hypertensive emergency Hypertensive emergency in setting of stroke and severely elevated blood pressure. Patient managed on Cleviprex  drip while in ICU. Cleviprex  weaned off. -Continue amlodipine , Coreg , spironolactone  and Entresto   Diabetes mellitus type 2 Well controlled based on hemoglobin A1C of 6.6%. -Continue SSI  Cocaine abuse Noted. Patient with a history of cocaine use, most recently 3 hours prior to presentation. Cocaine positive on UDS.  Abnormal CT chest Imaging significant for right apical pleural scarring versus density. Recommendation for repeat CT scan in 3 months from 01/03/2024.  Moderate malnutrition Diagnosis per dietitian assessment on 10/1. -Dietitian recommendations (10/1): Continue tube feeding via Cortrak tube: Osmolite 1.5 at 40 ml/h (960 ml per day) Prosource TF20 60 ml daily Provides 1520 kcal, 80 gm protein, 731 ml free water  daily Continue thiamine  daily Encourage PO intake as able   DVT prophylaxis: Heparin  subq Code Status:   Code Status: Full Code Family Communication: None at bedside. Called daughter but no response Disposition Plan: Discharge to SNF pending bed availability and increased nutrition intake   Consultants:  Neurology PCCM Neurosurgery  Procedures:  Right frontal ventriculostomy Cortrak placement  Antimicrobials: None    Subjective: Patient is less interactive  Objective: BP 120/75 (BP Location: Left Arm)   Pulse 77   Temp 97.6 F (36.4 C) (Oral)   Resp 19   Ht 5' 5 (1.651 m)   Wt 55.3 kg   LMP 12/24/2015 (Within Days)   SpO2 95%   BMI 20.29 kg/m   Examination:  General exam: Appears calm and comfortable. Respiratory system: Clear to auscultation. Respiratory effort  normal. Cardiovascular system: S1 & S2 heard, RRR. No murmur. Gastrointestinal system: Abdomen is  nondistended, soft and nontender. Normal bowel sounds heard. Central nervous system: Asleep but arouses somewhat to voice. Musculoskeletal: No edema. No calf tenderness   Data Reviewed: I have personally reviewed following labs and imaging studies  CBC Lab Results  Component Value Date   WBC 8.9 01/15/2024   RBC 4.51 01/15/2024   HGB 13.1 01/15/2024   HCT 39.7 01/15/2024   MCV 88.0 01/15/2024   MCH 29.0 01/15/2024   PLT 408 (H) 01/15/2024   MCHC 33.0 01/15/2024   RDW 13.5 01/15/2024   LYMPHSABS 1.8 01/15/2024   MONOABS 0.9 01/15/2024   EOSABS 0.0 01/15/2024   BASOSABS 0.0 01/15/2024     Last metabolic panel Lab Results  Component Value Date   NA 139 01/21/2024   K 4.2 01/21/2024   CL 104 01/21/2024   CO2 20 (L) 01/21/2024   BUN 33 (H) 01/21/2024   CREATININE 0.83 01/21/2024   GLUCOSE 152 (H) 01/21/2024   GFRNONAA >60 01/21/2024   GFRAA >90 01/21/2014   CALCIUM 9.3 01/21/2024   PHOS 4.1 01/21/2024   PROT 7.0 01/03/2024   ALBUMIN 2.9 (L) 01/03/2024   LABGLOB 3.6 11/15/2023   BILITOT 0.6 01/03/2024   ALKPHOS 66 01/03/2024   AST 24 01/03/2024   ALT 14 01/03/2024   ANIONGAP 15 01/21/2024    GFR: Estimated Creatinine Clearance: 61.4 mL/min (by C-G formula based on SCr of 0.83 mg/dL).  Recent Results (from the past 240 hours)  Culture, blood (Routine X 2) w Reflex to ID Panel     Status: None   Collection Time: 01/11/24  8:15 PM   Specimen: BLOOD  Result Value Ref Range Status   Specimen Description BLOOD SITE NOT SPECIFIED  Final   Special Requests   Final    BOTTLES DRAWN AEROBIC ONLY Blood Culture adequate volume   Culture   Final    NO GROWTH 5 DAYS Performed at [facility], [address], [place], [place] [address]    Report Status 01/16/2024 FINAL  Final  Culture, blood (Routine X 2) w Reflex to ID Panel     Status: None   Collection Time:  01/11/24  8:22 PM   Specimen: BLOOD  Result Value Ref Range Status   Specimen Description BLOOD SITE NOT SPECIFIED  Final   Special Requests   Final    BOTTLES DRAWN AEROBIC ONLY Blood Culture adequate volume   Culture   Final    NO GROWTH 5 DAYS Performed at [facility], [address] [place], [place] [address]    Report Status 01/16/2024 FINAL  Final      Radiology Studies: CT HEAD WO CONTRAST ( ) Result Date: 01/20/2024 CLINICAL DATA:  Mental status change, unknown cause. EXAM: CT HEAD WITHOUT CONTRAST TECHNIQUE: Contiguous axial images were obtained from the base of the skull through the vertex without intravenous contrast. RADIATION DOSE REDUCTION: This exam was performed according to the departmental dose-optimization program which includes automated exposure control, adjustment of the mA and/or kV according to patient size and/or use of iterative reconstruction technique. COMPARISON:  Head CT 01/13/2024 FINDINGS: Brain: The right thalamic hemorrhage has decreased in size and density, with the residual hyperdense central component measuring approximately 2 cm. Regional mass effect has improved with minimal residual leftward midline shift at the level of the thalami. A subcentimeter hemorrhage in the left basal ganglia has not significantly changed based on coronal and sagittal reformats. Trace hemorrhage in the occipital horns of the lateral ventricles on the prior  CT is no longer clearly present. The right frontal approach ventriculostomy catheter on the prior CT has been removed. The lateral ventricles are mildly enlarged and have slightly increased in size from the prior CT. The third ventricle remains partially effaced, and the fourth ventricle is unchanged. More discrete focal hypodensity in the right caudate nucleus and peripherally involving right frontal cortex/subcortical white matter corresponds to areas of hemorrhage adjacent to the catheter on the 01/10/2024 head CT. No  new intracranial hemorrhage, acute infarct, or extra-axial fluid collection is identified. Vascular: Calcified atherosclerosis at the skull base. No hyperdense vessel. Skull: Right frontal burr hole. No acute fracture or suspicious lesion. Sinuses/Orbits: Mild mucosal thickening in the included portions of the paranasal sinuses. Clear mastoid air cells. Unremarkable orbits. Other: Right frontal scalp skin staples. IMPRESSION: 1. Evolving right thalamic hemorrhage with decreased mass effect. 2. Unchanged subcentimeter left basal ganglia hemorrhage. 3. Interval removal of the ventriculostomy catheter with slight enlargement of the lateral ventricles. Electronically Signed   By: Dasie Hamburg M.D.   On: 01/20/2024 18:51      LOS: 18 days    Elgin Lam, MD [facility] 01/21/2024, 12:53 PM   If 7PM-7AM, please contact night-coverage [email or web address]

## 2024-01-21 NOTE — TOC Progression Note (Signed)
 Transition of Care [facility]) - Progression Note    Patient Details  Name: Amanda [name] MRN: [id number] Date of Birth: [birth year]  Transition of Care [facility] CM/SW Contact  Lauraine FORBES Saa, LCSWA Phone Number: 01/21/2024, 10:08 AM  Clinical Narrative:     10:08 AM Per chart review, patient remains with cortrak and restraints. CSW will send patient's FL2 to SNFs in [facility] upon cortrak and restraint removal. CSW will continue to follow.  Expected Discharge Plan: Skilled Nursing Facility Barriers to Discharge: English as a second language teacher, Continued Medical Work up, Inadequate or no insurance, Facility will not accept until restraint criteria met, SNF Pending bed offer               Expected Discharge Plan and Services In-house Referral: Clinical Social Work   Post Acute Care Choice: Skilled Nursing Facility Living arrangements for the past 2 months: Single Family Home                                       Social Drivers of Health (SDOH) Interventions SDOH Screenings   Food Insecurity: No Food Insecurity (07/16/2023)   Received from [facility]  Housing: Low Risk  (07/16/2023)   Received from [facility]  Transportation Needs: No Transportation Needs (07/16/2023)   Received from [facility]  Utilities: Not At Risk (07/16/2023)   Received from [facility]  Alcohol Screen: Low Risk  (05/14/2023)  Financial Resource Strain: Low Risk  (07/16/2023)   Received from [facility]  Social Connections: Unknown (10/19/2022)   Received from [facility]  Tobacco Use: High Risk (01/03/2024)    Readmission Risk Interventions    03/26/2022   10:50 AM  Readmission Risk Prevention Plan  Transportation Screening Complete  HRI or Home Care Consult Complete  Social Work Consult for Recovery Care Planning/Counseling Complete  Palliative Care Screening Not Applicable  Medication Review Oceanographer) Referral to Pharmacy

## 2024-01-21 NOTE — Progress Notes (Signed)
 Occupational Therapy Treatment Patient Details Name: Amanda [name] MRN: [id number] DOB: 09/07/1960 Today's Date: 01/21/2024   History of present illness 63 yo female presents to [facility] on 9/22 with lethargy and near-syncope s/p cocaine and opioid ingestion. 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 S/p R frontal ventriculostomy, burr holes 9/22. ETT 9/22-9/23. PMHx of cocaine abuse, CHF, CKD, chronic cough, HTN and DM2.   OT comments  Pt greeted in supine, nodding head to OT entering room. Observed pt with RLE hanging off EOB and but scooted down. Needs total A for supine scooting & repositioning. She needed total A for UB grooming, attempted feeding via hand-over-hand, and max A for AAROM/PROM to BUE as detailed below. Follows 1-step commands ~10% of the time; perseverative on wanting water  but made zero efforts to initiate sipping from straw. Current recommendations for post-acute rehab (< 3 hours/day) remain appropriate, will continue to follow.      If plan is discharge home, recommend the following:  Two people to help with walking and/or transfers;A lot of help with bathing/dressing/bathroom;Assistance with cooking/housework;Direct supervision/assist for medications management;Direct supervision/assist for financial management;Assist for transportation;Help with stairs or ramp for entrance   Equipment Recommendations  Other (comment) (defer)    Recommendations for Other Services      Precautions / Restrictions Precautions Precautions: Fall;Other (comment) Recall of Precautions/Restrictions: Impaired Precaution/Restrictions Comments: SBP < 160, bil mitt, NGT, pushes to L; delirium prevention Restrictions Weight Bearing Restrictions Per Provider Order: No       Mobility Bed Mobility               General bed mobility comments: total A for scooting hips/repositioning in supine     Transfers                   General transfer comment: deferred to EOB 2/2 mentation and confusion     Balance                                           ADL either performed or assessed with clinical judgement   ADL Overall ADL's : Needs assistance/impaired     Grooming: Maximal assistance;Wash/dry Therapist, sports and Hygiene: Total assistance Toileting - Clothing Manipulation Details (indicate cue type and reason): purewick            Extremity/Trunk Assessment              Vision   Alignment/Gaze Preference: Gaze right   Perception     Praxis     Communication Communication Communication: Impaired Factors Affecting Communication: Reduced clarity of speech;Difficulty expressing self   Cognition Arousal: Alert Behavior During Therapy: Flat affect Cognition: Cognition impaired             OT - Cognition Comments: pt not oriented, although intermittently responding to therapist; difficult to discern behaviors vs cognitive deficits at times; perseverating on wanting water  but then made no effort to initiate sipping water  from straw                 Following commands: Impaired Following commands impaired: Follows one step commands inconsistently (< 10%)      Cueing   Cueing Techniques: Verbal cues, Tactile cues,  Visual cues, Gestural cues  Exercises Exercises: General Upper Extremity General Exercises - Upper Extremity Shoulder Flexion: PROM, AAROM, 10 reps, Prone, Both Elbow Flexion: PROM, AAROM, Both, 10 reps, Supine Elbow Extension: PROM, AAROM, Both, 10 reps, Supine    Shoulder Instructions       General Comments      Pertinent Vitals/ Pain       Pain Assessment Pain Assessment: Faces Faces Pain Scale: Hurts whole lot Pain Location: RUE during arterial blood draw Pain Descriptors / Indicators: Grimacing Pain Intervention(s): Monitored during session,  Repositioned  Home Living                                          Prior Functioning/Environment              Frequency  Min 1X/week        Progress Toward Goals  OT Goals(current goals can now be found in the care plan section)  Progress towards OT goals: Progressing toward goals     Plan      Co-evaluation                 AM-PAC OT 6 Clicks Daily Activity     Outcome Measure   Help from another person eating meals?: Total Help from another person taking care of personal grooming?: Total Help from another person toileting, which includes using toliet, bedpan, or urinal?: Total Help from another person bathing (including washing, rinsing, drying)?: Total Help from another person to put on and taking off regular upper body clothing?: A Lot Help from another person to put on and taking off regular lower body clothing?: Total 6 Click Score: 7    End of Session    OT Visit Diagnosis: Unsteadiness on feet (R26.81);Other abnormalities of gait and mobility (R26.89);Muscle weakness (generalized) (M62.81)   Activity Tolerance Other (comment) (limited by cognitive & mentation status)   Patient Left in bed;with call bell/phone within reach;with bed alarm set;with restraints reapplied   Nurse Communication Other (comment) (status of session)        Time: 8390-8376 OT Time Calculation (min): 14 min  Charges: OT General Charges $OT Visit: 1 Visit OT Treatments $Therapeutic Exercise: 8-22 mins  Amanda [name] D., MSOT, OTR/L Acute Rehabilitation Services [phone] Secure Chat Preferred  [name] 01/21/2024, 4:36 PM

## 2024-01-22 DIAGNOSIS — I610 Nontraumatic intracerebral hemorrhage in hemisphere, subcortical: Secondary | ICD-10-CM | POA: Diagnosis not present

## 2024-01-22 LAB — GLUCOSE, CAPILLARY
Glucose-Capillary: 114 mg/dL — ABNORMAL HIGH (ref 70–99)
Glucose-Capillary: 128 mg/dL — ABNORMAL HIGH (ref 70–99)
Glucose-Capillary: 145 mg/dL — ABNORMAL HIGH (ref 70–99)
Glucose-Capillary: 145 mg/dL — ABNORMAL HIGH (ref 70–99)
Glucose-Capillary: 161 mg/dL — ABNORMAL HIGH (ref 70–99)
Glucose-Capillary: 166 mg/dL — ABNORMAL HIGH (ref 70–99)

## 2024-01-22 NOTE — Plan of Care (Signed)
  Problem: Tissue Perfusion: Goal: Adequacy of tissue perfusion will improve Outcome: Progressing   Problem: Elimination: Goal: Will not experience complications related to urinary retention Outcome: Progressing   Problem: Safety: Goal: Ability to remain free from injury will improve Outcome: Progressing   Problem: Intracerebral Hemorrhage Tissue Perfusion: Goal: Complications of Intracerebral Hemorrhage will be minimized Outcome: Progressing   Problem: Education: Goal: Knowledge of General Education information will improve Description: Including pain rating scale, medication(s)/side effects and non-pharmacologic comfort measures Outcome: Not Progressing   Problem: Education: Goal: Knowledge of disease or condition will improve Outcome: Not Progressing Goal: Knowledge of secondary prevention will improve (MUST DOCUMENT ALL) Outcome: Not Progressing Goal: Knowledge of patient specific risk factors will improve (DELETE if not current risk factor) Outcome: Not Progressing   Problem: Safety: Goal: Non-violent Restraint(s) Outcome: Completed/Met

## 2024-01-22 NOTE — Progress Notes (Signed)
 PROGRESS NOTE    MOE BRIER  FMW:[id number] DOB: 08/16/1960 DOA: 01/03/2024 PCP: Patient, No Pcp Per   Brief Narrative: Amanda [name] is a 63 y.o. female with a history of hypertension, heart failure, cocaine abuse,d diabetes mellitus type 2.  Patient presented secondary to lethargy and near syncope, found to have significantly elevated blood pressure and CT evidence of acute intraparenchymal hemorrhage with intraventricular hemorrhage and hydrocephalus. Patient admitted to ICU for close blood pressure management and Neurosurgery consulted, performing a right frontal ventriculostomy. EVD eventually removed on 10/3.   Assessment and Plan:  Acute right thalamic intraparenchymal hemorrhage Intraventricular hemorrhage Obstructive hydrocephalus In setting of cocaine use. 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 Neurosurgery consulted while the patient was in the ED, with neurosurgery recommendation for no emergent placement of a ventricular drain. Patient intubated and transferred to the ICU on admission. Blood pressure management with Cleviprex . Repeat CT head significant for increase in size of medial right thalamic hematoma. Patient underwent right frontal ventriculostomy on 9/22 which was eventual clamped on 10/1 and removed on 10/3.   Acute metabolic encephalopathy Present on presentation and related to hydrocephalus and IVH. Waxing and waning.  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  -Continue Coreg , spironolactone  and Entresto   Acute respiratory failure with hypoxia Patient with worsening mental status and concern for inability to protect her airway on admission. Patient was intubated successfully on 9/22 and managed via mechanical ventilation. Patient extubated successfully on 9/23. On room air.  Tachypnea Unclear etiology. Per patient, no dyspnea.  Possibly related to aspiration in setting of dysphagia and tube feeds. Chest x-ray unremarkable. Patient remains with SpO2 of 100% on room air. Possibly anxiety related. VBG consistent with respiratory alkalosis. D-dimer negative. Symptoms have improved today.  Primary hypertension Hypertensive emergency Hypertensive emergency in setting of stroke and severely elevated blood pressure. Patient managed on Cleviprex  drip while in ICU. Cleviprex  weaned off. -Continue amlodipine , Coreg , spironolactone  and Entresto   Diabetes mellitus type 2 Well controlled based on hemoglobin A1C of 6.6%. -Continue SSI  Cocaine abuse Noted. Patient with a history of cocaine use, most recently 3 hours prior to presentation. Cocaine positive on UDS.  Abnormal CT chest Imaging significant for right apical pleural scarring versus density. Recommendation for repeat CT scan in 3 months from 01/03/2024.  Moderate malnutrition Diagnosis per dietitian assessment on 10/1. -Dietitian recommendations (10/1): Continue tube feeding via Cortrak tube: Osmolite 1.5 at 40 ml/h (960 ml per day) Prosource TF20 60 ml daily Provides 1520 kcal, 80 gm protein, 731 ml free water  daily Continue thiamine  daily Encourage PO intake as able   DVT prophylaxis: Heparin  subq Code Status:   Code Status: Full Code Family Communication: Daughter at bedside Disposition Plan: Discharge to SNF pending bed availability and increased nutrition intake   Consultants:  Neurology PCCM Neurosurgery  Procedures:  Right frontal ventriculostomy Cortrak placement  Antimicrobials: None    Subjective: No specific concerns today.  Objective: BP 134/89 (BP Location: Right Arm)   Pulse 80   Temp 97.7 F (36.5 C) (Axillary)   Resp 18   Ht 5' 5 (1.651 m)   Wt 56.1 kg   LMP 12/24/2015 (Within Days)   SpO2 92%   BMI 20.58 kg/m   Examination:  General exam: Appears calm and comfortable. Respiratory system: Clear to auscultation.  Respiratory effort normal. Cardiovascular system: S1 & S2 heard, RRR. No murmur. Gastrointestinal system: Abdomen is nondistended, soft  and nontender. Normal bowel sounds heard. Central nervous system: Alert.   Data Reviewed: I have personally reviewed following labs and imaging studies  CBC Lab Results  Component Value Date   WBC 8.9 01/15/2024   RBC 4.51 01/15/2024   HGB 13.1 01/15/2024   HCT 39.7 01/15/2024   MCV 88.0 01/15/2024   MCH 29.0 01/15/2024   PLT 408 (H) 01/15/2024   MCHC 33.0 01/15/2024   RDW 13.5 01/15/2024   LYMPHSABS 1.8 01/15/2024   MONOABS 0.9 01/15/2024   EOSABS 0.0 01/15/2024   BASOSABS 0.0 01/15/2024     Last metabolic panel Lab Results  Component Value Date   NA 139 01/21/2024   K 4.2 01/21/2024   CL 104 01/21/2024   CO2 20 (L) 01/21/2024   BUN 33 (H) 01/21/2024   CREATININE 0.83 01/21/2024   GLUCOSE 152 (H) 01/21/2024   GFRNONAA >60 01/21/2024   GFRAA >90 01/21/2014   CALCIUM 9.3 01/21/2024   PHOS 4.1 01/21/2024   PROT 7.0 01/03/2024   ALBUMIN 2.9 (L) 01/03/2024   LABGLOB 3.6 11/15/2023   BILITOT 0.6 01/03/2024   ALKPHOS 66 01/03/2024   AST 24 01/03/2024   ALT 14 01/03/2024   ANIONGAP 15 01/21/2024    GFR: Estimated Creatinine Clearance: 62.2 mL/min (by C-G formula based on SCr of 0.83 mg/dL).  No results found for this or any previous visit (from the past 240 hours).     Radiology Studies: CT HEAD WO CONTRAST ( ) Result Date: 01/20/2024 CLINICAL DATA:  Mental status change, unknown cause. EXAM: CT HEAD WITHOUT CONTRAST TECHNIQUE: Contiguous axial images were obtained from the base of the skull through the vertex without intravenous contrast. RADIATION DOSE REDUCTION: This exam was performed according to the departmental dose-optimization program which includes automated exposure control, adjustment of the mA and/or kV according to patient size and/or use of iterative reconstruction technique. COMPARISON:  Head CT 01/13/2024  FINDINGS: Brain: The right thalamic hemorrhage has decreased in size and density, with the residual hyperdense central component measuring approximately 2 cm. Regional mass effect has improved with minimal residual leftward midline shift at the level of the thalami. A subcentimeter hemorrhage in the left basal ganglia has not significantly changed based on coronal and sagittal reformats. Trace hemorrhage in the occipital horns of the lateral ventricles on the prior CT is no longer clearly present. The right frontal approach ventriculostomy catheter on the prior CT has been removed. The lateral ventricles are mildly enlarged and have slightly increased in size from the prior CT. The third ventricle remains partially effaced, and the fourth ventricle is unchanged. More discrete focal hypodensity in the right caudate nucleus and peripherally involving right frontal cortex/subcortical white matter corresponds to areas of hemorrhage adjacent to the catheter on the 01/10/2024 head CT. No new intracranial hemorrhage, acute infarct, or extra-axial fluid collection is identified. Vascular: Calcified atherosclerosis at the skull base. No hyperdense vessel. Skull: Right frontal burr hole. No acute fracture or suspicious lesion. Sinuses/Orbits: Mild mucosal thickening in the included portions of the paranasal sinuses. Clear mastoid air cells. Unremarkable orbits. Other: Right frontal scalp skin staples. IMPRESSION: 1. Evolving right thalamic hemorrhage with decreased mass effect. 2. Unchanged subcentimeter left basal ganglia hemorrhage. 3. Interval removal of the ventriculostomy catheter with slight enlargement of the lateral ventricles. Electronically Signed   By: Dasie Hamburg M.D.   On: 01/20/2024 18:51      LOS: 19 days    Elgin Lam, MD [facility] 01/22/2024, 1:13 PM   If 7PM-7AM,  please contact night-coverage [email or web address]

## 2024-01-23 DIAGNOSIS — I610 Nontraumatic intracerebral hemorrhage in hemisphere, subcortical: Secondary | ICD-10-CM | POA: Diagnosis not present

## 2024-01-23 LAB — GLUCOSE, CAPILLARY
Glucose-Capillary: 134 mg/dL — ABNORMAL HIGH (ref 70–99)
Glucose-Capillary: 139 mg/dL — ABNORMAL HIGH (ref 70–99)
Glucose-Capillary: 147 mg/dL — ABNORMAL HIGH (ref 70–99)
Glucose-Capillary: 156 mg/dL — ABNORMAL HIGH (ref 70–99)
Glucose-Capillary: 158 mg/dL — ABNORMAL HIGH (ref 70–99)
Glucose-Capillary: 175 mg/dL — ABNORMAL HIGH (ref 70–99)

## 2024-01-23 NOTE — Plan of Care (Signed)
  Problem: Coping: Goal: Ability to adjust to condition or change in health will improve Outcome: Not Progressing   Problem: Health Behavior/Discharge Planning: Goal: Ability to identify and utilize available resources and services will improve Outcome: Not Progressing   Problem: Metabolic: Goal: Ability to maintain appropriate glucose levels will improve Outcome: Progressing   Problem: Nutritional: Goal: Progress toward achieving an optimal weight will improve Outcome: Not Progressing   Problem: Skin Integrity: Goal: Risk for impaired skin integrity will decrease Outcome: Progressing   Problem: Education: Goal: Knowledge of General Education information will improve Description: Including pain rating scale, medication(s)/side effects and non-pharmacologic comfort measures Outcome: Not Progressing   Problem: Health Behavior/Discharge Planning: Goal: Ability to manage health-related needs will improve Outcome: Not Progressing

## 2024-01-23 NOTE — Plan of Care (Signed)
  Problem: Tissue Perfusion: Goal: Adequacy of tissue perfusion will improve Outcome: Progressing   Problem: Clinical Measurements: Goal: Will remain free from infection Outcome: Progressing   Problem: Education: Goal: Ability to describe self-care measures that may prevent or decrease complications (Diabetes Survival Skills Education) will improve Outcome: Not Progressing   Problem: Coping: Goal: Ability to adjust to condition or change in health will improve Outcome: Not Progressing   Problem: Education: Goal: Knowledge of General Education information will improve Description: Including pain rating scale, medication(s)/side effects and non-pharmacologic comfort measures Outcome: Not Progressing

## 2024-01-23 NOTE — Progress Notes (Signed)
 During the visitation, the patient's visitor exhibited disruptive behavior, including harassing the nurse in the hallway and making false accusations to the family over the phone. Security was notified and responded to the situation.

## 2024-01-23 NOTE — Progress Notes (Addendum)
 PROGRESS NOTE    Amanda [name]  FMW:[id number] DOB: Jan 28, 1961 DOA: 01/03/2024 PCP: Patient, No Pcp Per   Brief Narrative: Amanda [name] is a 63 y.o. female with a history of hypertension, heart failure, cocaine abuse,d diabetes mellitus type 2.  Patient presented secondary to lethargy and near syncope, found to have significantly elevated blood pressure and CT evidence of acute intraparenchymal hemorrhage with intraventricular hemorrhage and hydrocephalus. Patient admitted to ICU for close blood pressure management and Neurosurgery consulted, performing a right frontal ventriculostomy. EVD eventually removed on 10/3.   Assessment and Plan:  Acute right thalamic intraparenchymal hemorrhage Intraventricular hemorrhage Obstructive hydrocephalus In setting of cocaine use. 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 Neurosurgery consulted while the patient was in the ED, with neurosurgery recommendation for no emergent placement of a ventricular drain. Patient intubated and transferred to the ICU on admission. Blood pressure management with Cleviprex . Repeat CT head significant for increase in size of medial right thalamic hematoma. Patient underwent right frontal ventriculostomy on 9/22 which was eventual clamped on 10/1 and removed on 10/3.   Acute metabolic encephalopathy Present on presentation and related to hydrocephalus and IVH. Waxing and waning.  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  -Continue Coreg , spironolactone  and Entresto   Acute respiratory failure with hypoxia Patient with worsening mental status and concern for inability to protect her airway on admission. Patient was intubated successfully on 9/22 and managed via mechanical ventilation. Patient extubated successfully on 9/23. On room air.  Tachypnea Unclear etiology. Per patient, no dyspnea.  Possibly related to aspiration in setting of dysphagia and tube feeds. Chest x-ray unremarkable. Patient remains with SpO2 of 100% on room air. Possibly anxiety related. VBG consistent with respiratory alkalosis. D-dimer negative. Symptoms have improved.  Primary hypertension Hypertensive emergency Hypertensive emergency in setting of stroke and severely elevated blood pressure. Patient managed on Cleviprex  drip while in ICU. Cleviprex  weaned off. -Continue amlodipine , Coreg , spironolactone  and Entresto   Diabetes mellitus type 2 Well controlled based on hemoglobin A1C of 6.6%. -Continue SSI  Cocaine abuse Noted. Patient with a history of cocaine use, most recently 3 hours prior to presentation. Cocaine positive on UDS.  Abnormal CT chest Imaging significant for right apical pleural scarring versus density. Recommendation for repeat CT scan in 3 months from 01/03/2024.  Moderate malnutrition Diagnosis per dietitian assessment on 10/1. -Dietitian recommendations (10/9): Continue tube feeding via cortrak: Osmolite 1.5 at 40 ml/h (960 ml per day) Prosource TF20 60 ml 1x/d Free water : 30mL every 4 hours Provides 1520 kcal, 80 gm protein, 731 ml free water  daily (911mL flush + TF) Continue thiamine  daily 100mg  daily Continue current diet as ordered Encourage PO intake as able -Will need to encourage PO intake. Discussed with daughter on 10/11 that if unsuccessful in advancing diet, may need to consider a PEG tube  DVT prophylaxis: Heparin  subq Code Status:   Code Status: Full Code Family Communication: None at bedside Disposition Plan: Discharge to SNF pending bed availability and increased nutrition intake   Consultants:  Neurology PCCM Neurosurgery  Procedures:  Right frontal ventriculostomy Cortrak placement  Antimicrobials: None    Subjective: No issues overnight.  Objective: BP (!) 141/82 (BP Location: Right Arm)   Pulse 72   Temp 97.9 F (36.6 C) (Oral)   Resp  18   Ht 5' 5 (1.651 m)   Wt 56.1 kg   LMP 12/24/2015 (Within Days)   SpO2 95%  BMI 20.58 kg/m   Examination:  General: Appears calm and comfortable. Respiratory: Respiratory effort normal. No retractions noted. No tachypnea. Gastrointestinal: Non-distended   Data Reviewed: I have personally reviewed following labs and imaging studies  CBC Lab Results  Component Value Date   WBC 8.9 01/15/2024   RBC 4.51 01/15/2024   HGB 13.1 01/15/2024   HCT 39.7 01/15/2024   MCV 88.0 01/15/2024   MCH 29.0 01/15/2024   PLT 408 (H) 01/15/2024   MCHC 33.0 01/15/2024   RDW 13.5 01/15/2024   LYMPHSABS 1.8 01/15/2024   MONOABS 0.9 01/15/2024   EOSABS 0.0 01/15/2024   BASOSABS 0.0 01/15/2024     Last metabolic panel Lab Results  Component Value Date   NA 139 01/21/2024   K 4.2 01/21/2024   CL 104 01/21/2024   CO2 20 (L) 01/21/2024   BUN 33 (H) 01/21/2024   CREATININE 0.83 01/21/2024   GLUCOSE 152 (H) 01/21/2024   GFRNONAA >60 01/21/2024   GFRAA >90 01/21/2014   CALCIUM 9.3 01/21/2024   PHOS 4.1 01/21/2024   PROT 7.0 01/03/2024   ALBUMIN 2.9 (L) 01/03/2024   LABGLOB 3.6 11/15/2023   BILITOT 0.6 01/03/2024   ALKPHOS 66 01/03/2024   AST 24 01/03/2024   ALT 14 01/03/2024   ANIONGAP 15 01/21/2024    GFR: Estimated Creatinine Clearance: 62.2 mL/min (by C-G formula based on SCr of 0.83 mg/dL).  No results found for this or any previous visit (from the past 240 hours).     Radiology Studies: No results found.     LOS: 20 days    Elgin Lam, MD [facility] 01/23/2024, 10:56 AM   If 7PM-7AM, please contact night-coverage [email or web address]

## 2024-01-23 NOTE — Progress Notes (Signed)
 Patient noted aggressive verbally and physically during ADLs, RN and tech able to provide peri care, oral care, mittens maintained in placed.

## 2024-01-24 DIAGNOSIS — I610 Nontraumatic intracerebral hemorrhage in hemisphere, subcortical: Secondary | ICD-10-CM | POA: Diagnosis not present

## 2024-01-24 LAB — GLUCOSE, CAPILLARY
Glucose-Capillary: 117 mg/dL — ABNORMAL HIGH (ref 70–99)
Glucose-Capillary: 127 mg/dL — ABNORMAL HIGH (ref 70–99)
Glucose-Capillary: 144 mg/dL — ABNORMAL HIGH (ref 70–99)
Glucose-Capillary: 147 mg/dL — ABNORMAL HIGH (ref 70–99)
Glucose-Capillary: 149 mg/dL — ABNORMAL HIGH (ref 70–99)
Glucose-Capillary: 151 mg/dL — ABNORMAL HIGH (ref 70–99)

## 2024-01-24 LAB — BASIC METABOLIC PANEL WITH GFR
Anion gap: 11 (ref 5–15)
BUN: 29 mg/dL — ABNORMAL HIGH (ref 8–23)
CO2: 24 mmol/L (ref 22–32)
Calcium: 9.2 mg/dL (ref 8.9–10.3)
Chloride: 106 mmol/L (ref 98–111)
Creatinine, Ser: 0.71 mg/dL (ref 0.44–1.00)
GFR, Estimated: >60 mL/min (ref >60)
Glucose, Bld: 137 mg/dL — ABNORMAL HIGH (ref 70–99)
Potassium: 4.2 mmol/L (ref 3.5–5.1)
Sodium: 141 mmol/L (ref 135–145)

## 2024-01-24 LAB — CBC
HCT: 39.4 % (ref 36.0–46.0)
Hemoglobin: 12.7 g/dL (ref 12.0–15.0)
MCH: 28.8 pg (ref 26.0–34.0)
MCHC: 32.2 g/dL (ref 30.0–36.0)
MCV: 89.3 fL (ref 80.0–100.0)
Platelets: 444 K/uL — ABNORMAL HIGH (ref 150–400)
RBC: 4.41 MIL/uL (ref 3.87–5.11)
RDW: 14.3 % (ref 11.5–15.5)
WBC: 7.4 K/uL (ref 4.0–10.5)
nRBC: 0 % (ref 0.0–0.2)

## 2024-01-24 NOTE — Plan of Care (Signed)
  Problem: Skin Integrity: Goal: Risk for impaired skin integrity will decrease Outcome: Progressing   Problem: Tissue Perfusion: Goal: Adequacy of tissue perfusion will improve Outcome: Progressing   Problem: Clinical Measurements: Goal: Respiratory complications will improve Outcome: Progressing   Problem: Elimination: Goal: Will not experience complications related to bowel motility Outcome: Progressing Goal: Will not experience complications related to urinary retention Outcome: Progressing   Problem: Education: Goal: Ability to describe self-care measures that may prevent or decrease complications (Diabetes Survival Skills Education) will improve Outcome: Not Progressing   Problem: Education: Goal: Knowledge of General Education information will improve Description: Including pain rating scale, medication(s)/side effects and non-pharmacologic comfort measures Outcome: Not Progressing

## 2024-01-24 NOTE — Progress Notes (Signed)
 Speech Language Pathology Treatment: Dysphagia;Cognitive-Linguistic  Patient Details Name: Amanda [name] MRN: [id number] DOB: [birth year] Today's Date: 01/24/2024 Time: 9084-9059 SLP Time Calculation (min) (ACUTE ONLY): 25 min  Assessment / Plan / Recommendation Clinical Impression  Cognition still impacting ability to consume PO. Today pt was initially lethargic, but fully awake by end of session. Even when awake she is apathetic, doesn't initiate often, has no interest in PO. SLP was able to total assist feed her ice cream and a graham cracker, which she masticated well despite being poorly attentive. When SLP tried to give water  pt had to be fully attentive, holding the cup herself and taking sips, otherwise she didn't seal lips to straw to draw up liquid.  We can upgrade diet texture to offer more palatable foods, but given severity of right hemisphere disorder, expect pt to require supplemented nutrition into the coming months. Will upgrade die to mechanical soft - dys 3/thin liquids.    HPI HPI: [name] with polysubstance/cocaine abuse 3 hrs before presentation, who called EMS for AMS, lethargy and near syncope. Her condition deteriorated (unable to protect her airway and more lethargic) and got intubated. 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 HTN, CHF, DM-2.      SLP Plan  Continue with current plan of care          Recommendations  Diet recommendations: Dysphagia 3 (mechanical soft);Thin liquid Liquids provided via: Cup;Straw Medication Administration: Crushed with puree Supervision: Full supervision/cueing for compensatory strategies Compensations: Slow rate;Small sips/bites Postural Changes and/or Swallow Maneuvers: Seated upright 90 degrees                  Oral  care BID   Frequent or constant Supervision/Assistance Dysphagia, unspecified (R13.10)     Continue with current plan of care     Katelyn Kohlmeyer, Consuelo Fitch  01/24/2024, 10:23 AM

## 2024-01-24 NOTE — Progress Notes (Signed)
 PROGRESS NOTE  Amanda [name]  FMW:[id number] DOB: [birth year] DOA: 01/03/2024 PCP: Patient, No Pcp Per   Brief Narrative: Patient is a 63 year old female with history of hypertension, cocaine abuse, diabetes type 2 who presented with lethargy, near syncope.  Found to be hypotensive on presentation.  CT showed acute intraparenchymal hemorrhage , intraventricular hemorrhage and hydrocephalus.  Patient was admitted to ICU for close blood pressure management.  Neurosurgery consulted.  Status post right frontal ventriculostomy.  EVD eventually removed on 10/3.  Hospital course remarkable for dysphagia.  Speech therapy following.  Possible plan for PEG placement.  PT/OT recommending SNF on discharge. . 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  Neurosurgery consulted.  Repeat CT head significant for increase in the size of medial right thalamic hematoma.  Status post right frontal ventriculostomy on 9/22 which was eventually clamped on 10/1 and removed on 10/3.  Has left hemiparesis  Hypertensive urgency: Currently blood pressure stable.  Severely hypertensive on presentation.  Managed with Cleviprex  drip in ICU.  Currently on amlodipine , Coreg , aspirin , Entresto   Acute metabolic encephalopathy: Secondary to intraventricular hemorrhage, hydrocephalus.  Monitor mentation.  Chronic combined systolic/diastolic CHF: Echo showed EF of 40% with associated grade 1 diastolic dysfunction.   Continue current medications  Acute hypoxic respiratory failure: Was intubated on 9/22 for airway protection.  Extubated on 9/23.  Currently on room air. Was tachypneic few days ago, chest x-ray unremarkable.  Possibly anxiety related.  Type 2 diabetes: Recent A1c of 6.6.  Currently on sliding scale  Cocaine abuse: UDS positive for cocaine  Dysphagia: Started on feeding via core track.  Also on dysphagia 3 diet.  Speech therapy following and recommended PEG placement.  Will discuss with family.  Abnormal CT chest:Imaging significant for right apical pleural scarring versus density. Recommendation for repeat CT scan in 3 months from 01/03/2024.    Nutrition Problem: Moderate Malnutrition Etiology: social / environmental circumstances (polysubstance abuse)    DVT prophylaxis:SCD's Start: 01/11/24 2301 heparin  injection 5,000 Units Start: 01/05/24 1015 Place and maintain sequential compression device Start: 01/03/24 0611 SCDs Start: 01/03/24 0429     Code Status: Full Code  Family Communication: None at the bedside  Patient status:Inpatient  Patient is from :Home  Anticipated discharge to:SNF  Estimated DC date: Not sure   Consultants: Neurosurgery ,pccm  Procedures: Intubation/extubation, right frontal ventriculostomy, core track placement  Antimicrobials:  Anti-infectives (From admission, onward)    Start     Dose/Rate Route Frequency Ordered Stop   01/13/24 0600  ceFAZolin (ANCEF) IVPB 2g/100 mL premix        2 g 200 mL/hr over 30 Minutes Intravenous On call to O.R. 01/11/24 2300 01/13/24 0557       Subjective:  Patient seen and examined at bedside today.  Lying in bed.  Has core track in place.  Awake but not oriented.  Follows commands intermittently.  Has left hemiplegia.  After discussion of speech therapy, we may need to put PEG.  I discussed with her daughter [name] about requirement of the PEG, she requested to  call back in an hour so that she can discuss  with her family   Objective: Vitals:   01/23/24 2200 01/24/24 0400 01/24/24 0500 01/24/24 0801  BP: (!) 155/93 127/89  (!) 130/92  Pulse:    73  Resp:    18  Temp: 98.6 F (37 C) 98.1 F (36.7 C)  (!) 97.4 F (36.3 C)  TempSrc: Axillary Axillary  Oral  SpO2: 91% 92%  96%  Weight:   56 kg   Height:        Intake/Output Summary (Last 24 hours) at 01/24/2024 1124 Last data filed at 01/23/2024 1643 Gross per 24 hour  Intake 1630.51 ml  Output --  Net 1630.51 ml   Filed Weights   01/21/24 0500 01/22/24 0500 01/24/24 0500  Weight: 55.3 kg 56.1 kg 56 kg    Examination:  General exam: Overall comfortable, not in distress HEENT: PERRL, Cortrack Respiratory system:  no wheezes or crackles  Cardiovascular system: S1 & S2 heard, RRR.  Gastrointestinal system: Abdomen is nondistended, soft and nontender. Central nervous system: Alert and awake but not oriented, left hemiplegia, follows commands Extremities: No edema, no clubbing ,no cyanosis Skin: No rashes, no ulcers,no icterus     Data Reviewed: I have personally reviewed following labs and imaging studies  CBC: Recent Labs  Lab 01/24/24 0137  WBC 7.4  HGB 12.7  HCT 39.4  MCV 89.3  PLT 444*   Basic Metabolic Panel: Recent Labs  Lab 01/19/24 1212 01/21/24 0603 01/24/24 0137  NA 137 139 141  K 4.5 4.2 4.2  CL 103 104 106  CO2 22 20* 24  GLUCOSE 145* 152* 137*  BUN 32* 33* 29*  CREATININE 0.83 0.83 0.71  CALCIUM 9.3 9.3 9.2  MG  --  2.3  --   PHOS  --  4.1  --      No results found for this or any previous visit (from the past 240 hours).   Radiology Studies: No results found.  Scheduled Meds:  amLODipine   10 mg Per Tube Daily   carvedilol   25 mg Per Tube BID WC   feeding supplement (PROSource TF20)  60 mL Per Tube Daily   heparin  injection (subcutaneous)  5,000 Units Subcutaneous Q8H   insulin  aspart  0-9 Units Subcutaneous Q4H   mouth rinse  15 mL Mouth Rinse 4 times per day   QUEtiapine   25  mg Per Tube QHS   sacubitril -valsartan   1 tablet Per Tube BID   spironolactone   25 mg Per Tube Daily   Continuous Infusions:  feeding supplement (OSMOLITE 1.5 CAL) 1,000 mL (01/23/24 1656)     LOS: 21 days   Ivonne Mustache, MD [facility] P10/13/2025, 11:24 AM

## 2024-01-24 NOTE — Plan of Care (Signed)
  Problem: Education: Goal: Ability to describe self-care measures that may prevent or decrease complications (Diabetes Survival Skills Education) will improve Outcome: Not Progressing   Problem: Health Behavior/Discharge Planning: Goal: Ability to identify and utilize available resources and services will improve Outcome: Not Progressing   Problem: Health Behavior/Discharge Planning: Goal: Ability to manage health-related needs will improve Outcome: Not Progressing   Problem: Nutritional: Goal: Maintenance of adequate nutrition will improve Outcome: Not Progressing

## 2024-01-25 ENCOUNTER — Inpatient Hospital Stay (HOSPITAL_COMMUNITY)

## 2024-01-25 DIAGNOSIS — I610 Nontraumatic intracerebral hemorrhage in hemisphere, subcortical: Secondary | ICD-10-CM | POA: Diagnosis not present

## 2024-01-25 LAB — GLUCOSE, CAPILLARY
Glucose-Capillary: 101 mg/dL — ABNORMAL HIGH (ref 70–99)
Glucose-Capillary: 114 mg/dL — ABNORMAL HIGH (ref 70–99)
Glucose-Capillary: 116 mg/dL — ABNORMAL HIGH (ref 70–99)
Glucose-Capillary: 118 mg/dL — ABNORMAL HIGH (ref 70–99)
Glucose-Capillary: 123 mg/dL — ABNORMAL HIGH (ref 70–99)
Glucose-Capillary: 153 mg/dL — ABNORMAL HIGH (ref 70–99)

## 2024-01-25 NOTE — Plan of Care (Signed)
 Eating with assistance today. Sat on bedside today. Down for CT of ABD. [name] Benders called to give permission for feeding tube this am.    Problem: Education: Goal: Ability to describe self-care measures that may prevent or decrease complications (Diabetes Survival Skills Education) will improve Outcome: Progressing   Problem: Nutritional: Goal: Maintenance of adequate nutrition will improve Outcome: Progressing   Problem: Safety: Goal: Ability to remain free from injury will improve Outcome: Progressing   Problem: Skin Integrity: Goal: Risk for impaired skin integrity will decrease Outcome: Progressing

## 2024-01-25 NOTE — Progress Notes (Signed)
 Nutrition Follow-up  DOCUMENTATION CODES:  Non-severe (moderate) malnutrition in context of social or environmental circumstances (polysubstance abuse)  INTERVENTION:  Continue tube feeding via cortrak: Osmolite 1.5 at 40 ml/h (960 ml per day) Prosource TF20 60 ml 1x/d Free water : 30mL every 4 hours Provides 1520 kcal, 80 gm protein, 731 ml free water  daily (911mL flush + TF)  Continue current diet as ordered Encourage PO intake as able   Recommend PEG placement as pt appears to need long term nutrition support and continues to eat 0% and be lethargic   NUTRITION DIAGNOSIS:  Moderate Malnutrition related to social / environmental circumstances (polysubstance abuse) as evidenced by mild muscle depletion, moderate fat depletion, mild fat depletion, moderate muscle depletion. - remains applicable  GOAL:  Patient will meet greater than or equal to 90% of their needs - met with TF at goal  MONITOR:  TF tolerance, Vent status, Labs  REASON FOR ASSESSMENT:  Consult Enteral/tube feeding initiation and management  ASSESSMENT:  Pt with hx HTN, CHF, type 2 diabetes, polysubstance abuse, and pulmonary edema. Admitted with AMS, lethargy, and deteriorating condition requiring intubation; diagnosed ICH.  9/22 - admitted, intubated, EVD placed 9/23 - extubated 9/24 - s/p cortrak placement; tip gastric  9/25 - Diet advanced to Dysphagia 2/thin; TF stopped 9/26 - back down to Full liquids, TF resumed  10/13- upgraded to DYS 3, but SLP recommends PEG  Pt resting in bed at the time of assessment. Opens her eyes but does not interact or answer questions. TF infusing at goal rate and appear to be well tolerated. Per SLP, pt has been upgraded to DYS 3 thin liquids to offer more palatable foods but believes pt will continue to need nutrition support long term given very limited interaction and PO intake. Pt remains very lethargic and not participating in therapy.   Mittens still in place. Will  continue TF as ordered and monitor for an improvement in mentation and PO intake to adjust. Recommend PEG placement due to long term need for nutrition support, MD has discussed with pt's daughter who agrees. IR has been consulted to place PEG. Once PEG placed, will evaluate need to transition to bolus feeds.  Admit weight: 56.1 kg  Current weight: 53.5 kg    Average Meal Intake: 10/6-10/8: 0% intake x 4 recorded meals 10/6-10/12: 0% intake x 7 recorded meals  Nutritionally Relevant Medications: Scheduled Meds:  PROSource TF20  60 mL Per Tube Daily   insulin  aspart  0-9 Units Subcutaneous Q4H   spironolactone   25 mg Oral Daily   Continuous Infusions:  feeding supplement (OSMOLITE 1.5 CAL) 1,000 mL (01/19/24 0244)   PRN Meds: polyethylene glycol  Labs Reviewed: CBG ranges from 101-153 mg/dL over the last 24 hours HgbA1c 6.6%   Diet Order:   Diet Order             DIET DYS 3 Room service appropriate? No; Fluid consistency: Thin  Diet effective now                  EDUCATION NEEDS:  Not appropriate for education at this time  Skin:  Skin Assessment: Reviewed RN Assessment  Last BM:  10/14 type 6  Height:  Ht Readings from Last 1 Encounters:  01/03/24 5' 5 (1.651 m)    Weight:  Wt Readings from Last 1 Encounters:  01/25/24 53.5 kg    Ideal Body Weight:  56.8 kg  BMI:  Body mass index is 19.63 kg/m.  Estimated Nutritional Needs:  Kcal:  1500-1700 Protein:  80-90 grams Fluid:  1.5-1.7L    Josette Glance, MS, RDN, LDN Clinical Dietitian I Please reach out via secure chat

## 2024-01-25 NOTE — Progress Notes (Signed)
 Occupational Therapy Treatment Patient Details Name: Amanda [name] MRN: [id number] DOB: December 18, 1960 Today's Date: 01/25/2024   History of present illness 63 yo female [facility]/22 with lethargy and near-syncope s/p cocaine and opioid ingestion. 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 S/p R frontal ventriculostomy, burr holes 9/22. ETT 9/22-9/23. PMHx of cocaine abuse, CHF, CKD, chronic cough, HTN and DM2.   OT comments  Pt received in supine, alert, agreeable for session. Pt preferring to engage with RN, although more participatory compared to previous session. AOX2. Require max-total A +2 for bed mobility and up to mod A to sustain sitting balance (posterior & L lateral lean at EOB). Needs total A for feeding and LB dressing, pt with poor initiation and task execution. Highly distractible and tangential; mood fluctuating during session. Poor selective attention. O2 ~90-92% on RA, otherwise tolerated session well. Will continue to follow and progress as able.       If plan is discharge home, recommend the following:  Two people to help with walking and/or transfers;A lot of help with bathing/dressing/bathroom;Assistance with cooking/housework;Direct supervision/assist for medications management;Direct supervision/assist for financial management;Assist for transportation;Help with stairs or ramp for entrance   Equipment Recommendations  Other (comment) (defer)    Recommendations for Other Services      Precautions / Restrictions Precautions Precautions: Fall;Other (comment) Recall of Precautions/Restrictions: Impaired Precaution/Restrictions Comments: SBP < 160, bil mitt, NGT, pushes to L; delirium prevention Restrictions Weight Bearing Restrictions Per Provider Order: No       Mobility Bed Mobility Overal bed mobility: Needs Assistance Bed Mobility: Sit to Supine, Supine to  Sit     Supine to sit: Max assist, +2 for physical assistance Sit to supine: Total assist, +2 for physical assistance   General bed mobility comments: Maximal cueing for transitioning to EOB, assist for trunk elevation, exited to the L side.    Transfers Overall transfer level: Needs assistance Equipment used: 2 person hand held assist Transfers: Sit to/from Stand Sit to Stand: Mod assist, +2 physical assistance           General transfer comment: Stood from bed with cues for initiation/sequencing. LLE hyperextended with poor correction.     Balance Overall balance assessment: Needs assistance Sitting-balance support: Feet supported, Single extremity supported Sitting balance-Leahy Scale: Poor Sitting balance - Comments: pt requiring min-mod A to sustain sitting balance, pt with moderate posterior lean at EOB, cues to correct; intermittent L lateral lean Postural control: Posterior lean, Left lateral lean Standing balance support: Bilateral upper extremity supported, Reliant on assistive device for balance Standing balance-Leahy Scale: Zero Standing balance comment: B HHA in stance, heavy anterior pelvic tilt in stance.                           ADL either performed or assessed with clinical judgement   ADL   Eating/Feeding: Total assistance;Bed level Eating/Feeding Details (indicate cue type and reason): Cortrak; assist for bringing spoon to mouth and attempting to bring cup to mouth, poor initiation                 Lower Body Dressing: Total assistance;Sitting/lateral leans Lower Body Dressing Details (indicate cue type and reason): pt picking up sock but with poor motor planning/execution of task despite max prompting to don sock     Toileting- Clothing Manipulation and Hygiene: Total assistance Toileting - Clothing Manipulation Details (indicate cue type  and reason): purewick            Extremity/Trunk Assessment Upper Extremity  Assessment Upper Extremity Assessment: Generalized weakness LUE Deficits / Details: moving LUE when prompted with tactile cues inconsistently, prefers to use RUE            Vision   Additional Comments: difficult to formally assess vision 2/2 cog/behaviors - pt's head stopping just before midline in attempt to track stimuli to the L side   Perception Perception Perception-Other Comments: L neglect   Praxis     Communication Communication Communication: Impaired Factors Affecting Communication: Reduced clarity of speech;Difficulty expressing self (also has poor dentition)   Cognition Arousal: Alert Behavior During Therapy: Flat affect, Agitated Cognition: Cognition impaired   Orientation impairments: Situation, Time (re-oriented appropriately) Awareness: Intellectual awareness impaired Memory impairment (select all impairments): Short-term memory, Working memory Attention impairment (select first level of impairment): Sustained attention Executive functioning impairment (select all impairments): Initiation, Sequencing, Reasoning, Problem solving OT - Cognition Comments: mood labile during session, extremely tangential & disorganized, easily distractible                 Following commands: Impaired Following commands impaired: Follows one step commands inconsistently      Cueing   Cueing Techniques: Verbal cues, Tactile cues, Visual cues, Gestural cues  Exercises Other Exercises Other Exercises: WB through B forearms, needing mod A to facilitate 1x4 to R side.    Shoulder Instructions       General Comments SpO2 ~90-92% on RA during session    Pertinent Vitals/ Pain       Pain Assessment Pain Assessment: Faces Pain Score: 0-No pain  Home Living                                          Prior Functioning/Environment              Frequency  Min 1X/week        Progress Toward Goals  OT Goals(current goals can now be found in  the care plan section)  Progress towards OT goals: Progressing toward goals (slowly, inconsistently)     Plan      Co-evaluation    PT/OT/SLP Co-Evaluation/Treatment: Yes Reason for Co-Treatment: Complexity of the patient's impairments (multi-system involvement);Necessary to address cognition/behavior during functional activity;For patient/therapist safety;To address functional/ADL transfers PT goals addressed during session: Mobility/safety with mobility;Balance;Strengthening/ROM OT goals addressed during session: ADL's and self-care;Strengthening/ROM (cognition)      AM-PAC OT 6 Clicks Daily Activity     Outcome Measure   Help from another person eating meals?: Total Help from another person taking care of personal grooming?: A Little Help from another person toileting, which includes using toliet, bedpan, or urinal?: Total Help from another person bathing (including washing, rinsing, drying)?: Total Help from another person to put on and taking off regular upper body clothing?: A Lot Help from another person to put on and taking off regular lower body clothing?: Total 6 Click Score: 9    End of Session    OT Visit Diagnosis: Unsteadiness on feet (R26.81);Other abnormalities of gait and mobility (R26.89);Muscle weakness (generalized) (M62.81)   Activity Tolerance Patient tolerated treatment well (limited by cog impairments and behavior)   Patient Left in bed;with call bell/phone within reach;with bed alarm set;with restraints reapplied   Nurse Communication Other (comment) (status of session)  Time: 9042-8969 OT Time Calculation (min): 33 min  Charges: OT General Charges $OT Visit: 1 Visit OT Treatments $Therapeutic Activity: 8-22 mins  Ilah Boule D., MSOT, OTR/L Acute Rehabilitation Services [phone] Secure Chat Preferred  [name] 01/25/2024, 12:55 PM

## 2024-01-25 NOTE — Progress Notes (Signed)
 PROGRESS NOTE  Amanda [name]  FMW:[id number] DOB: 02-Oct-1960 DOA: 01/03/2024 PCP: Amanda [name], No Pcp Per   Brief Narrative: Amanda [name] is a 63 year old female with history of hypertension, cocaine abuse, diabetes type 2 who presented with lethargy, near syncope.  Found to be hypotensive on presentation.  CT showed acute intraparenchymal hemorrhage , intraventricular hemorrhage and hydrocephalus.  Amanda [name] was admitted to ICU for close blood pressure management.  Neurosurgery consulted.  Status post right frontal ventriculostomy.  EVD eventually removed on 10/3.  Hospital course remarkable for dysphagia.  Speech therapy following.  Dietitian/speech therapy recommending PEG placement because she is not able to tolerate anything by mouth.  PT/OT recommending SNF on discharge.  Plan for PEG placement.  IR consulted . 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  Neurosurgery consulted.  Repeat CT head significant for increase in the size of medial right thalamic hematoma.  Status post right frontal ventriculostomy on 9/22 which was eventually clamped on 10/1 and removed on 10/3.  Has left hemiparesis  Hypertensive urgency: Currently blood pressure stable.  Severely hypertensive on presentation.  Managed with Cleviprex  drip in ICU.  Currently on amlodipine , Coreg , aspirin , Entresto   Acute metabolic encephalopathy: Secondary to intraventricular hemorrhage, hydrocephalus.  Monitor  mentation.  Chronic combined systolic/diastolic CHF: Echo showed EF of 40% with associated grade 1 diastolic dysfunction.  Continue current medications  Acute hypoxic respiratory failure: Was intubated on 9/22 for airway protection.  Extubated on 9/23.  Currently on room air. Was tachypneic few days ago, chest x-ray unremarkable.  Possibly anxiety related.  Type 2 diabetes: Recent A1c of 6.6.  Currently on sliding scale  Cocaine abuse: UDS positive for cocaine  Dysphagia: Started on feeding via core track.  Also on dysphagia 3 diet.  Speech therapy following and recommended PEG placement because she has poor oral intake.  Daughter agreeable.  IR consulted for PEG placement  Abnormal CT chest:Imaging significant for right apical pleural scarring versus density. Recommendation for repeat CT scan in 3 months from 01/03/2024.    Nutrition Problem: Moderate Malnutrition Etiology: social / environmental circumstances (polysubstance abuse)    DVT prophylaxis:SCD's Start: 01/11/24 2301 heparin  injection 5,000 Units Start: 01/05/24 1015 Place and maintain sequential compression device Start: 01/03/24 0611 SCDs Start: 01/03/24 0429     Code Status: Full Code  Family Communication: Daughter [name] on phone on 10/13, 10/14  Amanda [name] status:Inpatient  Amanda [name] is from :Home  Anticipated discharge to:SNF  Estimated DC date: Not sure   Consultants: Neurosurgery ,pccm  Procedures: Intubation/extubation, right frontal ventriculostomy, core track placement  Antimicrobials:  Anti-infectives (From admission, onward)    Start     Dose/Rate Route Frequency Ordered Stop   01/13/24 0600  ceFAZolin (ANCEF) IVPB 2g/100 mL premix        2 g 200 mL/hr over 30 Minutes Intravenous On call to O.R. 01/11/24 2300 01/13/24 0557       Subjective:  Amanda [name] seen and examined at bedside today.  She was working with OT/PT.  She is alert and awake but inconsistent with holding commands.  Continues  to  have poor oral intake.  Not in any current distress.  Plan to go ahead with PEG placement   Objective: Vitals:   01/25/24 0430 01/25/24 0434 01/25/24 0743 01/25/24 1125  BP: 125/85  (!) 145/84 (!) 141/80  Pulse: 68  66 70  Resp: 18  14 19   Temp: 97.8 F (36.6 C)  98 F (36.7 C) 97.9 F (36.6 C)  TempSrc: Axillary  Axillary Oral  SpO2: 95%  95% 98%  Weight:  53.5 kg    Height:        Intake/Output Summary (Last 24 hours) at 01/25/2024 1142 Last data filed at 01/25/2024 0034 Gross per 24 hour  Intake 1460.67 ml  Output --  Net 1460.67 ml   Filed Weights   01/22/24 0500 01/24/24 0500 01/25/24 0434  Weight: 56.1 kg 56 kg 53.5 kg    Examination:   General exam: Overall comfortable, not in distress HEENT: PERRL,cortrack Respiratory system:  no wheezes or crackles  Cardiovascular system: S1 & S2 heard, RRR.  Gastrointestinal system: Abdomen is nondistended, soft and nontender. Central nervous system: Alert and awake but not oriented, left hemiplegia, inconsistent with following commands Extremities: No edema, no clubbing ,no cyanosis Skin: No rashes, no ulcers,no icterus     Data Reviewed: I have personally reviewed following labs and imaging studies  CBC: Recent Labs  Lab 01/24/24 0137  WBC 7.4  HGB 12.7  HCT 39.4  MCV 89.3  PLT 444*   Basic Metabolic Panel: Recent Labs  Lab 01/19/24 1212 01/21/24 0603 01/24/24 0137  NA 137 139 141  K 4.5 4.2 4.2  CL 103 104 106  CO2 22 20* 24  GLUCOSE 145* 152* 137*  BUN 32* 33* 29*  CREATININE 0.83 0.83 0.71  CALCIUM 9.3 9.3 9.2  MG  --  2.3  --   PHOS  --  4.1  --      No results found for this or any previous visit (from the past 240 hours).   Radiology Studies: No results found.  Scheduled Meds:  amLODipine   10 mg Per Tube Daily   carvedilol   25 mg Per Tube BID WC   feeding supplement (PROSource TF20)  60 mL Per Tube Daily   heparin  injection (subcutaneous)  5,000 Units Subcutaneous Q8H   insulin   aspart  0-9 Units Subcutaneous Q4H   mouth rinse  15 mL Mouth Rinse 4 times per day   QUEtiapine   25 mg Per Tube QHS   sacubitril -valsartan   1 tablet Per Tube BID   spironolactone   25 mg Per Tube Daily   Continuous Infusions:  feeding supplement (OSMOLITE 1.5 CAL) 40 mL/hr at 01/25/24 0034     LOS: 22 days   Ivonne Mustache, MD [facility] P10/14/2025, 11:42 AM

## 2024-01-25 NOTE — Progress Notes (Signed)
 Going to CT of ABD now

## 2024-01-25 NOTE — Plan of Care (Signed)
  Problem: Nutritional: Goal: Maintenance of adequate nutrition will improve Outcome: Progressing Goal: Progress toward achieving an optimal weight will improve Outcome: Progressing   Problem: Skin Integrity: Goal: Risk for impaired skin integrity will decrease Outcome: Progressing   Problem: Clinical Measurements: Goal: Ability to maintain clinical measurements within normal limits will improve Outcome: Progressing Goal: Will remain free from infection Outcome: Progressing Goal: Diagnostic test results will improve Outcome: Progressing Goal: Respiratory complications will improve Outcome: Progressing Goal: Cardiovascular complication will be avoided Outcome: Progressing   Problem: Elimination: Goal: Will not experience complications related to bowel motility Outcome: Progressing Goal: Will not experience complications related to urinary retention Outcome: Progressing   Problem: Pain Managment: Goal: General experience of comfort will improve and/or be controlled Outcome: Progressing   Problem: Safety: Goal: Ability to remain free from injury will improve Outcome: Progressing

## 2024-01-25 NOTE — Progress Notes (Signed)
 Physical Therapy Treatment Patient Details Name: Amanda [name] MRN: [id number] DOB: Nov 30, 1960 Today's Date: 01/25/2024   History of Present Illness 63 yo female presents to [facility] on 9/22 with lethargy and near-syncope s/p cocaine and opioid ingestion. 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 S/p R frontal ventriculostomy, burr holes 9/22. ETT 9/22-9/23. PMHx of cocaine abuse, CHF, CKD, chronic cough, HTN and DM2.    PT Comments  Pt received in supine and appears more alert and responsive this session. Pt able to participate in mobility tasks with increased cues and assist due to impaired cognition. Pt reports back pain while sitting EOB requiring encouragement to remain sitting. Pt demonstrates posterior lean, but does not demonstrate strong pushing with RUE this session. Pt with R gaze and does not visually track to or past midline despite max verbal and visual cues. Pt demonstrates L inattention, but is able to perform LLE LAQ with initial assist and max cues. Attempted to engage pt in reaching tasks with pt able to perform with RUE initially, but pt stops participating despite encouragement. Pt able to stand x3 with 2 short steps towards [facility] with assist to advance BLE. Pt demonstrates good initiation, but short standing tolerance due to fatigue. Pt continues to benefit from PT services to progress toward functional mobility goals.    If plan is discharge home, recommend the following: Two people to help with walking and/or transfers;Two people to help with bathing/dressing/bathroom;Supervision due to cognitive status;Direct supervision/assist for financial management;Assistance with feeding;Direct supervision/assist for medications management;Assist for transportation;Help with stairs or ramp for entrance   Can travel by private vehicle     No  Equipment Recommendations  Hospital bed;Wheelchair  (measurements PT);Wheelchair cushion (measurements PT);Hoyer lift;BSC/3in1    Recommendations for Other Services       Precautions / Restrictions Precautions Precautions: Fall;Other (comment) Recall of Precautions/Restrictions: Impaired Precaution/Restrictions Comments: SBP < 160, bil mitt, NGT, pushes to L; delirium prevention Restrictions Weight Bearing Restrictions Per Provider Order: No     Mobility  Bed Mobility Overal bed mobility: Needs Assistance Bed Mobility: Sit to Supine, Supine to Sit     Supine to sit: Max assist, +2 for physical assistance Sit to supine: +2 for physical assistance, Mod assist   General bed mobility comments: Pt able to bend knees, but requires assist to advance. Increased cues for use of bedrail and sequencing. ultimately max A +2 to sit to EOB. Pt initiates return to supine with assist to elevate BLE to EOB and guide trunk for safety    Transfers Overall transfer level: Needs assistance Equipment used: 2 person hand held assist Transfers: Sit to/from Stand, Bed to chair/wheelchair/BSC Sit to Stand: Mod assist, +2 physical assistance           General transfer comment: STS from EOB x3 with mod A +2 for power up and balance. Facilitation of hip extension to reach an upright posture. Pt able to take 2 small steps at EOB with assist to advance feet during final trial. Pt with poor awareness of B feet position    Ambulation/Gait                   Stairs             Wheelchair Mobility     Tilt Bed    Modified Rankin (Stroke Patients Only) Modified Rankin (Stroke Patients Only) Pre-Morbid Rankin Score: No symptoms Modified Rankin: Severe disability     Balance  Overall balance assessment: Needs assistance Sitting-balance support: Feet supported, Single extremity supported Sitting balance-Leahy Scale: Poor Sitting balance - Comments: Sitting EOB with posterior lean requiring min-mod A to maintain balance and cues for  RUE placement.   Standing balance support: Bilateral upper extremity supported, Reliant on assistive device for balance Standing balance-Leahy Scale: Zero Standing balance comment: Pt reliant on external support                            Communication Communication Communication: Impaired Factors Affecting Communication: Reduced clarity of speech;Difficulty expressing self  Cognition Arousal: Alert Behavior During Therapy: Flat affect, Agitated   PT - Cognitive impairments: Awareness, Attention, Safety/Judgement, Sequencing, Initiation, Problem solving                       PT - Cognition Comments: Pt alert and responding, but requires increased cues to follow commands inconsistently. Pt easily distracted throughout requiring frequent redirection. Pt becomes agitated, but is able to redirect. Following commands: Impaired Following commands impaired: Follows one step commands inconsistently    Cueing Cueing Techniques: Verbal cues, Tactile cues, Visual cues, Gestural cues  Exercises General Exercises - Lower Extremity Long Arc Quad: AROM, Seated, Both, 5 reps    General Comments General comments (skin integrity, edema, etc.): SpO2 ~90-92% on RA throughout session      Pertinent Vitals/Pain Pain Assessment Pain Assessment: Faces Faces Pain Scale: Hurts little more Pain Location: back while sitting EOB Pain Descriptors / Indicators: Grimacing, Sore Pain Intervention(s): Monitored during session, Repositioned     PT Goals (current goals can now be found in the care plan section) Acute Rehab PT Goals Patient Stated Goal: did not state PT Goal Formulation: Patient unable to participate in goal setting Time For Goal Achievement: 01/31/24 Progress towards PT goals: Progressing toward goals    Frequency    Min 2X/week      PT Plan      Co-evaluation PT/OT/SLP Co-Evaluation/Treatment: Yes Reason for Co-Treatment: Complexity of the patient's  impairments (multi-system involvement);Necessary to address cognition/behavior during functional activity;For patient/therapist safety;To address functional/ADL transfers PT goals addressed during session: Mobility/safety with mobility;Balance;Strengthening/ROM        AM-PAC PT 6 Clicks Mobility   Outcome Measure  Help needed turning from your back to your side while in a flat bed without using bedrails?: Total Help needed moving from lying on your back to sitting on the side of a flat bed without using bedrails?: Total Help needed moving to and from a bed to a chair (including a wheelchair)?: Total Help needed standing up from a chair using your arms (e.g., wheelchair or bedside chair)?: Total Help needed to walk in hospital room?: Total Help needed climbing 3-5 steps with a railing? : Total 6 Click Score: 6    End of Session   Activity Tolerance: Patient tolerated treatment well Patient left: in bed;with call bell/phone within reach;with bed alarm set;with restraints reapplied Nurse Communication: Mobility status PT Visit Diagnosis: Unsteadiness on feet (R26.81);Difficulty in walking, not elsewhere classified (R26.2);Other abnormalities of gait and mobility (R26.89);Other symptoms and signs involving the nervous system (R29.898);Muscle weakness (generalized) (M62.81)     Time: 9042-8969 PT Time Calculation (min) (ACUTE ONLY): 33 min  Charges:    $Therapeutic Activity: 8-22 mins PT General Charges $$ ACUTE PT VISIT: 1 Visit                     Darryle  Ferdie, PTA Acute Rehabilitation Services Secure Chat Preferred  [phone]    Darryle Ferdie 01/25/2024, 12:04 PM

## 2024-01-26 ENCOUNTER — Encounter (HOSPITAL_COMMUNITY): Payer: Self-pay | Admitting: Neurology

## 2024-01-26 DIAGNOSIS — I610 Nontraumatic intracerebral hemorrhage in hemisphere, subcortical: Secondary | ICD-10-CM | POA: Diagnosis not present

## 2024-01-26 LAB — GLUCOSE, CAPILLARY
Glucose-Capillary: 105 mg/dL — ABNORMAL HIGH (ref 70–99)
Glucose-Capillary: 122 mg/dL — ABNORMAL HIGH (ref 70–99)
Glucose-Capillary: 131 mg/dL — ABNORMAL HIGH (ref 70–99)
Glucose-Capillary: 135 mg/dL — ABNORMAL HIGH (ref 70–99)
Glucose-Capillary: 139 mg/dL — ABNORMAL HIGH (ref 70–99)
Glucose-Capillary: 146 mg/dL — ABNORMAL HIGH (ref 70–99)

## 2024-01-26 LAB — PROTIME-INR
INR: 1.1 (ref 0.8–1.2)
Prothrombin Time: 14.7 s (ref 11.4–15.2)

## 2024-01-26 NOTE — Plan of Care (Signed)
  Problem: Health Behavior/Discharge Planning: Goal: Ability to identify and utilize available resources and services will improve Outcome: Progressing Goal: Ability to manage health-related needs will improve Outcome: Progressing   Problem: Nutritional: Goal: Maintenance of adequate nutrition will improve Outcome: Progressing Goal: Progress toward achieving an optimal weight will improve Outcome: Progressing   Problem: Skin Integrity: Goal: Risk for impaired skin integrity will decrease Outcome: Progressing   Problem: Clinical Measurements: Goal: Ability to maintain clinical measurements within normal limits will improve Outcome: Progressing Goal: Will remain free from infection Outcome: Progressing Goal: Diagnostic test results will improve Outcome: Progressing Goal: Respiratory complications will improve Outcome: Progressing Goal: Cardiovascular complication will be avoided Outcome: Progressing   Problem: Nutrition: Goal: Adequate nutrition will be maintained Outcome: Progressing   Problem: Elimination: Goal: Will not experience complications related to bowel motility Outcome: Progressing Goal: Will not experience complications related to urinary retention Outcome: Progressing   Problem: Pain Managment: Goal: General experience of comfort will improve and/or be controlled Outcome: Progressing   Problem: Safety: Goal: Ability to remain free from injury will improve Outcome: Progressing

## 2024-01-26 NOTE — TOC Progression Note (Signed)
 Transition of Care [facility]) - Progression Note    Patient Details  Name: Amanda [name] MRN: [id number] Date of Birth: December 13, 1960  [facility] CM/SW Contact  Almarie CHRISTELLA [place]ie, [place] Phone Number: 01/26/2024, 12:31 PM  Clinical Narrative:   CSW noting that consult placed for peg placement. CSW completed referral and faxed out for SNF, awaiting bed offers.     Expected Discharge Plan: Skilled Nursing Facility Barriers to Discharge: English as a second language teacher, Continued Medical Work up, Inadequate or no insurance, Facility will not accept until restraint criteria met, SNF Pending bed offer               Expected Discharge Plan and Services In-house Referral: Clinical Social Work   Post Acute Care Choice: Skilled Nursing Facility Living arrangements for the past 2 months: Single Family Home                                       Social Drivers of Health (SDOH) Interventions SDOH Screenings   Food Insecurity: No Food Insecurity (07/16/2023)   Received from [facility]  Housing: Low Risk  (07/16/2023)   Received from [facility]  Transportation Needs: No Transportation Needs (07/16/2023)   Received from [facility]  Utilities: Not At Risk (07/16/2023)   Received from [facility]  Alcohol Screen: Low Risk  (05/14/2023)  Financial Resource Strain: Low Risk  (07/16/2023)   Received from [facility]  Social Connections: Unknown (10/19/2022)   Received from [facility]  Tobacco Use: High Risk (01/03/2024)    Readmission Risk Interventions    03/26/2022   10:50 AM  Readmission Risk Prevention Plan  Transportation Screening Complete  HRI or Home Care Consult Complete  Social Work Consult for Recovery Care Planning/Counseling Complete  Palliative Care Screening Not Applicable  Medication Review Oceanographer) Referral to Pharmacy

## 2024-01-26 NOTE — Consult Note (Signed)
 Chief Complaint: Patient was seen in consultation today for dysphagia  Referring Physician(s): Dr. Ivonne Mustache  Supervising Physician: [name] Simmonds  Patient Status: [facility] - In-pt  History of Present Illness: Amanda [name] is a 63 y.o. female with past medical history of CKD, HTN, DM2, polysubstance abuse admitted with acute right thalamic intraparenchymal hemorrhage, intraventricular hemorrhage, obstructive hydrocephalus now with ongoing neurological deficits including dysphagia.  She does have a Dysphagia 3 diet, however with poor stamina and attention leading to poor oral intake.  IR consutled for percutaneous gastrostomy tube placement.   CT Abdomen reviewed by Dr. Karalee who approves patient for the procedure.   Patient assessed at bedside.  Untouched D3 tray at bedside.  Patient sleeping soundly.  Arouses but does not interact and falls back to sleep.  In mittens with NGT in place.   Past Medical History:  Diagnosis Date   Chronic cough    Chronic kidney disease    Congestive heart failure (CHF) (HCC)    Elevated troponin 09/08/2021   Hypertension    Pulmonary edema 09/08/2021   Stress incontinence    Type 2 diabetes mellitus (HCC)     Past Surgical History:  Procedure Laterality Date   I & D EXTREMITY Right 01/21/2014   Procedure: IRRIGATION AND DEBRIDEMENT RIGHT INDEX FINGER;  Surgeon: Franky Curia, MD;  Location: [facility] OR;  Service: Orthopedics;  Laterality: Right;    Allergies: Patient has no known allergies.  Medications: Prior to Admission medications   Medication Sig Start Date End Date Taking? Authorizing Provider  acetaminophen  (TYLENOL ) 500 MG tablet Take 1 tablet (500 mg total) by mouth every 6 (six) hours as needed. 08/30/23   Johnie Flaming A, NP  carvedilol  (COREG ) 3.125 MG tablet Take 1 tablet (3.125 mg total) by mouth 2 (two) times daily. 06/18/23 11/14/24  Rising, Asberry, PA-C  empagliflozin  (JARDIANCE ) 10 MG TABS tablet Take 10 mg by mouth  daily. 07/16/23   [provider]  furosemide  (LASIX ) 20 MG tablet Take 1 tablet (20 mg total) by mouth as needed for fluid or edema. Weight gain of 3 lb in 24 hours or 5 lb in a week. 11/15/23 02/13/24  Zenaida Morene PARAS, MD  isosorbide -hydrALAZINE  (BIDIL ) 20-37.5 MG tablet Take 1 tablet by mouth 3 (three) times daily.    [provider]  Multiple Vitamins-Minerals (MULTIVITAMIN GUMMIES ADULT) CHEW Chew 2 each by mouth daily.    [provider]  sacubitril -valsartan  (ENTRESTO ) 49-51 MG Take 1 tablet by mouth 2 (two) times daily. 11/15/23   Zenaida Morene PARAS, MD  spironolactone  (ALDACTONE ) 25 MG tablet Take 1 tablet (25 mg total) by mouth daily. 06/18/23 11/14/24  Rising, Asberry RIGGERS  VENTOLIN  HFA 108 (90 Base) MCG/ACT inhaler Inhale 2 puffs into the lungs every 6 (six) hours as needed.    [provider]     Family History  Problem Relation Age of Onset   Heart disease Mother        had a pacemaker    Social History   Socioeconomic History   Marital status: Single    Spouse name: Not on file   Number of children: 4   Years of education: Not on file   Highest education level: High school graduate  Occupational History    Comment: Not working right now   Occupation: unemployed  Tobacco Use   Smoking status: Every Day    Current packs/day: 1.50    Average packs/day: 1.5 packs/day for 45.8 years (68.7 ttl pk-yrs)  Types: Cigarettes    Start date: 88   Smokeless tobacco: Never  Vaping Use   Vaping status: Never Used  Substance and Sexual Activity   Alcohol use: Yes    Comment: 3 times a week   Drug use: Not Currently    Types: Crack cocaine   Sexual activity: Yes  Other Topics Concern   Not on file  Social History Narrative   Not on file   Social Drivers of Health   Financial Resource Strain: Low Risk  (07/16/2023)   Received from [facility]   Overall Financial Resource Strain (CARDIA)    Difficulty of Paying Living Expenses: Not hard  at all  Food Insecurity: No Food Insecurity (07/16/2023)   Received from [facility]   Hunger Vital Sign    Within the past 12 months, you worried that your food would run out before you got the money to buy more.: Never true    Within the past 12 months, the food you bought just didn't last and you didn't have money to get more.: Never true  Transportation Needs: No Transportation Needs (07/16/2023)   Received from [facility] - Transportation    Lack of Transportation (Medical): No    Lack of Transportation (Non-Medical): No  Physical Activity: Not on file  Stress: Not on file  Social Connections: Unknown (10/19/2022)   Received from [facility]   Social Network    Social Network: Not on file     Review of Systems: A 12 point ROS discussed and pertinent positives are indicated in the HPI above.  All other systems are negative.  Review of Systems  Unable to perform ROS: Mental status change    Vital Signs: BP 111/78 (BP Location: Left Arm)   Pulse 72   Temp 97.8 F (36.6 C) (Axillary)   Resp 14   Ht 5' 5 (1.651 m)   Wt 120 lb 13 oz (54.8 kg)   LMP 12/24/2015 (Within Days)   SpO2 99%   BMI 20.10 kg/m   Physical Exam Vitals and nursing note reviewed.  Constitutional:      Appearance: She is ill-appearing.  HENT:     Nose:     Comments: NGT in place.  Cardiovascular:     Rate and Rhythm: Normal rate.  Pulmonary:     Effort: Pulmonary effort is normal.  Abdominal:     General: Abdomen is flat. There is no distension.     Palpations: Abdomen is soft.  Skin:    General: Skin is warm and dry.  Neurological:     General: No focal deficit present.     Mental Status: She is alert and oriented to person, place, and time. Mental status is at baseline.  Psychiatric:        Mood and Affect: Mood normal.        Behavior: Behavior normal.        Thought Content: Thought content normal.        Judgment: Judgment normal.      MD Evaluation Airway:  WNL Heart: WNL Abdomen: WNL Chest/ Lungs: WNL ASA  Classification: 3 Mallampati/Airway Score: Two   Imaging: CT ABDOMEN WO CONTRAST Result Date: 01/25/2024 CLINICAL DATA:  Evaluate anatomy for possible gastrostomy tube placement EXAM: CT ABDOMEN WITHOUT CONTRAST TECHNIQUE: Multidetector CT imaging of the abdomen was performed following the standard protocol without IV contrast. RADIATION DOSE REDUCTION: This exam was performed according to the departmental dose-optimization program which includes automated exposure  control, adjustment of the mA and/or kV according to patient size and/or use of iterative reconstruction technique. COMPARISON:  Recent prior CT scan of the chest, abdomen and pelvis 01/03/2024 FINDINGS: Lower chest: Cardiomegaly. Mild dependent atelectasis versus infiltrate in both lower lobes. Hepatobiliary: No focal liver abnormality is seen. No gallstones, gallbladder wall thickening, or biliary dilatation. Pancreas: Unremarkable. No pancreatic ductal dilatation or surrounding inflammatory changes. Spleen: Normal in size without focal abnormality. Adrenals/Urinary Tract: Normal adrenal glands. No hydronephrosis or nephrolithiasis. Simple cyst in the interpolar collecting system of the left kidney again noted. Stomach/Bowel: Colonic interposition. The splenic flexure of the colon overlies a portion of the stomach. However, the gastric antrum does extend to the anterior abdominal wall above the redundant transverse colon. This should allow for percutaneous gastrostomy tube placement. A post pyloric feeding tube is present with the tip in the descending duodenum. No evidence of bowel obstruction. Vascular/Lymphatic: Limited evaluation in the absence of intravenous contrast. Scattered atherosclerotic calcifications. No suspicious lymphadenopathy. Other: No evidence of ascites or large abdominal wall hernia. Musculoskeletal: No acute fracture or aggressive appearing lytic or blastic osseous  lesion. IMPRESSION: 1. Anatomy is suitable for percutaneous gastrostomy tube placement. 2. Additional ancillary findings as above. Electronically Signed   By: Wilkie Lent M.D.   On: 01/25/2024 15:55   CT HEAD WO CONTRAST ( ) Result Date: 01/20/2024 CLINICAL DATA:  Mental status change, unknown cause. EXAM: CT HEAD WITHOUT CONTRAST TECHNIQUE: Contiguous axial images were obtained from the base of the skull through the vertex without intravenous contrast. RADIATION DOSE REDUCTION: This exam was performed according to the departmental dose-optimization program which includes automated exposure control, adjustment of the mA and/or kV according to patient size and/or use of iterative reconstruction technique. COMPARISON:  Head CT 01/13/2024 FINDINGS: Brain: The right thalamic hemorrhage has decreased in size and density, with the residual hyperdense central component measuring approximately 2 cm. Regional mass effect has improved with minimal residual leftward midline shift at the level of the thalami. A subcentimeter hemorrhage in the left basal ganglia has not significantly changed based on coronal and sagittal reformats. Trace hemorrhage in the occipital horns of the lateral ventricles on the prior CT is no longer clearly present. The right frontal approach ventriculostomy catheter on the prior CT has been removed. The lateral ventricles are mildly enlarged and have slightly increased in size from the prior CT. The third ventricle remains partially effaced, and the fourth ventricle is unchanged. More discrete focal hypodensity in the right caudate nucleus and peripherally involving right frontal cortex/subcortical white matter corresponds to areas of hemorrhage adjacent to the catheter on the 01/10/2024 head CT. No new intracranial hemorrhage, acute infarct, or extra-axial fluid collection is identified. Vascular: Calcified atherosclerosis at the skull base. No hyperdense vessel. Skull: Right frontal burr  hole. No acute fracture or suspicious lesion. Sinuses/Orbits: Mild mucosal thickening in the included portions of the paranasal sinuses. Clear mastoid air cells. Unremarkable orbits. Other: Right frontal scalp skin staples. IMPRESSION: 1. Evolving right thalamic hemorrhage with decreased mass effect. 2. Unchanged subcentimeter left basal ganglia hemorrhage. 3. Interval removal of the ventriculostomy catheter with slight enlargement of the lateral ventricles. Electronically Signed   By: Dasie Hamburg M.D.   On: 01/20/2024 18:51   DG CHEST PORT 1 VIEW Result Date: 01/19/2024 CLINICAL DATA:  Tachypnea. EXAM: PORTABLE CHEST 1 VIEW COMPARISON:  01/11/2024. FINDINGS: The heart size and mediastinal contours are unchanged. Feeding tube courses below the left hemidiaphragm, beyond the field of view. Suggestion of increased  central interstitial coarsening, which could reflect pulmonary vascular congestion. No focal consolidation, pleural effusion, or pneumothorax. Visualized osseous structures are unchanged. IMPRESSION: Suggestion of increased central interstitial coarsening, which could reflect pulmonary vascular congestion. Otherwise, no significant change from the prior exam. Electronically Signed   By: Harrietta Sherry M.D.   On: 01/19/2024 15:09   CT HEAD WO CONTRAST ( ) Result Date: 01/13/2024 EXAM: CT HEAD WITHOUT CONTRAST 01/13/2024 04:34:33 AM TECHNIQUE: CT of the head was performed without the administration of intravenous contrast. Automated exposure control, iterative reconstruction, and/or weight based adjustment of the mA/kV was utilized to reduce the radiation dose to as low as reasonably achievable. COMPARISON: CT of the head dated 01/10/2024. CLINICAL HISTORY: Subarachnoid hemorrhage [facility]). FINDINGS: BRAIN AND VENTRICLES: The patient's right thalamic hemorrhage has improved in the interim decreasing in size from 30 x 20 x 31 mm to approximately 25 x 17 x 24 mm. Focal hemorrhage within the left basal  ganglia has also improved. A right frontal approach ventriculostomy catheter remains within the frontal horn of the right lateral ventricle. There has been interval near complete resolution of hemorrhage along the tract of the catheter. Intraventricular hemorrhage has also nearly resolved bilaterally. There is no evidence of interval hemorrhage. The lateral ventricles remain mildly prominent, but similar in size to the prior study. No evidence of acute infarct. No hydrocephalus. No extra-axial collection. No mass effect or midline shift. ORBITS: No acute abnormality. SINUSES: No acute abnormality. SOFT TISSUES AND SKULL: No acute soft tissue abnormality. No skull fracture. IMPRESSION: 1. Interval improvement of right thalamic, left basal ganglia, and intraventricular hemorrhages. 2. No new or increasing hemorrhage. 3. Mild ventriculomegaly, unchanged. Electronically signed by: Evalene Coho MD 01/13/2024 04:53 AM EDT RP Workstation: [id number]   DG CHEST PORT 1 VIEW Result Date: 01/11/2024 CLINICAL DATA:  Fever. EXAM: PORTABLE CHEST 1 VIEW COMPARISON:  January 07, 2024. FINDINGS: Stable cardiomediastinal silhouette. Feeding tube is seen entering stomach. Lungs are clear. Bony thorax is unremarkable. IMPRESSION: No active disease. Electronically Signed   By: Lynwood Landy Raddle M.D.   On: 01/11/2024 17:29   CT HEAD WO CONTRAST ( ) Result Date: 01/10/2024 CLINICAL DATA:  63 year old female with medial right thalamic hemorrhage presenting on 01/03/2024. Ventriculostomy. Subsequent encounter. EXAM: CT HEAD WITHOUT CONTRAST TECHNIQUE: Contiguous axial images were obtained from the base of the skull through the vertex without intravenous contrast. RADIATION DOSE REDUCTION: This exam was performed according to the departmental dose-optimization program which includes automated exposure control, adjustment of the mA and/or kV according to patient size and/or use of iterative reconstruction technique. COMPARISON:   01/07/2024 head CT and earlier. FINDINGS: Brain: Hyperdense right thalamic hemorrhage appearing more indistinct and mildly fading over this series of exams. Blood products there are now roughly 30 by 20 x 31 mm (AP by transverse by CC) for an estimated volume of 9 mL now, versus 13 mL on 01/03/2024. Subarachnoid blood and intraventricular hemorrhage also mildly decreased over this series of exams. And decreased small volume of blood along the course of right superior frontal approach ventriculostomy also. Stable ventricle size. Stable mild mass effect in the region of the right thalamus. No areas of progressive hemorrhage. Contralateral small left basal ganglia hemorrhage also noted, and regressed since 01/03/2024 (series 3, image 19 now). Stable gray-white matter differentiation throughout the brain. Basilar cisterns remain patent. Vascular: Calcified atherosclerosis at the skull base. Skull: Stable right superior burr hole. Sinuses/Orbits: Partially visible left nasoenteric tube remaining in place. Stable paranasal sinus mucosal thickening and  opacification. Tympanic cavities mastoids remain well aerated. Other: Stable postoperative changes at the right convexity related to EVD. Visualized orbit soft tissues are within normal limits. IMPRESSION: 1. Gradual regression of right thalamic hemorrhage, now estimated at 9 mL. With stable IVH and SAH both improved since presentation. Stable right superior approach EVD with decreased small volume of blood along the tract. And a contralateral small left basal ganglia hemorrhage also appears regressed since presentation (series 3, image 19 now). 2. Stable ventricle size. Mild residual intracranial mass effect. No new intracranial abnormality. Electronically Signed   By: VEAR Hurst M.D.   On: 01/10/2024 05:40   DG CHEST PORT 1 VIEW Result Date: 01/07/2024 EXAM: 1 VIEW(S) XRAY OF THE CHEST 01/07/2024 09:23:24 AM COMPARISON: 01/03/2024 CLINICAL HISTORY: tachypnea FINDINGS:  LINES, TUBES AND DEVICES: Enteric tube in place, coursing below diaphragm with tip outside field of view. Interval extubation. The endotracheal tube has been removed. LUNGS AND PLEURA: Upper zone pulmonary vascular prominence and indistinctness compatible with pulmonary venous hypertension. Emphysema noted. No overt edema. No pleural effusion. No pneumothorax. HEART AND MEDIASTINUM: Stable cardiomegaly. BONES AND SOFT TISSUES: Lower thoracic spondylosis noted. No acute osseous abnormality. IMPRESSION: 1. Upper zone pulmonary vascular prominence and indistinctness compatible with pulmonary venous hypertension. No overt edema. 2. Stable cardiomegaly. 3. Emphysema. 4. Lower thoracic spondylosis. Electronically signed by: Ryan Salvage MD 01/07/2024 02:56 PM EDT RP Workstation: [id number]   CT HEAD WO CONTRAST ( ) Result Date: 01/07/2024 CLINICAL DATA:  Provided history: Stroke, follow-up; neuro change. EXAM: CT HEAD WITHOUT CONTRAST TECHNIQUE: Contiguous axial images were obtained from the base of the skull through the vertex without intravenous contrast. RADIATION DOSE REDUCTION: This exam was performed according to the departmental dose-optimization program which includes automated exposure control, adjustment of the mA and/or kV according to patient size and/or use of iterative reconstruction technique. COMPARISON:  Head CT 01/04/2024.  Brain MRI 01/03/2024. FINDINGS: Brain: Unchanged position of a right frontal approach ventricular catheter, terminating within the anterior body/frontal horn of the right lateral ventricle (abutting the septum pellucidum). Hemorrhage along the catheter tract has slightly increased since the head CT of 01/04/2024. An acute parenchymal hemorrhage centered within the right thalamus has not significantly changed in size, measuring 2.8 x 2.0 x 3.6 cm (10 mL) (remeasured on prior). As before, the hemorrhage partially effaces the third ventricle. Hemorrhage within the occipital  horns of both lateral ventricles persists, but has diminished. Unchanged size and configuration of the ventricular system. Unchanged subcentimeter focus of hyperdensity within the left basal ganglia, which could reflect contrast staining or hemorrhage. Vascular: No hyperdense vessel.  Atherosclerotic calcifications. Skull: Right frontal burr hole with traversing ventricular catheter. Sinuses/Orbits: No mass or acute finding within the imaged orbits. Mild mucosal thickening within the frontal sinuses inferiorly. Mild-to-moderate mucosal thickening within bilateral ethmoid air cells. 11 mm mucous retention cyst, and mild background mucosal thickening, within the right sphenoid sinus. Minimal mucosal thickening within the left sphenoid sinus. Other: Partially imaged nasoenteric tube. Right frontal scalp staples. IMPRESSION: 1. Unchanged position of a right frontal approach ventricular catheter. Hemorrhage along the catheter tract has slightly increased since the head CT of 01/04/2024. 2. 10 mL acute parenchymal hemorrhage centered within the right thalamus, unchanged in size. As before, the hemorrhage partially effaces the third ventricle. 3. Hemorrhage within the occipital horns of both lateral ventricles, decreased. 4. Unchanged size and configuration of the ventricular system. 5. Unchanged subcentimeter focus of hyperdensity within the left basal ganglia, which could reflect contrast staining or hemorrhage.  6. Paranasal sinus disease as described. Electronically Signed   By: Rockey Childs D.O.   On: 01/07/2024 13:22   CT HEAD WO CONTRAST ( ) Result Date: 01/04/2024 EXAM: CT HEAD WITHOUT CONTRAST 01/04/2024 09:01:31 AM TECHNIQUE: CT of the head was performed without the administration of intravenous contrast. Automated exposure control, iterative reconstruction, and/or weight based adjustment of the mA/kV was utilized to reduce the radiation dose to as low as reasonably achievable. COMPARISON: CT of the head  dated 01/03/2024. CLINICAL HISTORY: Headache, fever. FINDINGS: BRAIN AND VENTRICLES: Right frontal approach ventriculostomy catheter has been placed and its abuts the septum pellucidum. There is new hemorrhage along the posterior margin of the tract of the catheter, including within the caudate. The intrathalamic hemorrhage appears to have decreased in size in the interim from approximately 3.2 x 2.5 x 3.2 cm to approximately 2.8 x 2.0 x 3.2 cm. There is slightly more blood layering dependently within the posterior horns of the lateral ventricles. The area of contrast enhancement within the left basal ganglia has diminished in the interim. ORBITS: No acute abnormality. SINUSES: No acute abnormality. SOFT TISSUES AND SKULL: No acute soft tissue abnormality. No skull fracture. IMPRESSION: 1. Right frontal approach ventriculostomy catheter in place, abutting the septum pellucidum, with new hemorrhage along the posterior margin of the tract, including within the caudate. 2. Decreased size of intrathalamic hemorrhage. 3. Slightly increased blood layering dependently within the posterior horns of the lateral ventricles. 4. Diminished area of contrast enhancement within the left basal ganglia. Electronically signed by: Evalene Coho MD 01/04/2024 09:21 AM EDT RP Workstation: [id number]   MR BRAIN WO CONTRAST Result Date: 01/03/2024 CLINICAL DATA:  Stroke, hemorrhagic Assess for transependymal CSF flow. EXAM: MRI HEAD WITHOUT CONTRAST TECHNIQUE: Multiplanar, multiecho pulse sequences of the brain and surrounding structures were obtained without intravenous contrast. COMPARISON:  CT head earlier today. FINDINGS: Brain: When comparing across modalities, similar size of an intraparenchymal hemorrhage centered in the right thalamus with additional smaller intraparenchymal hemorrhage in the left basal ganglia. Right frontal approach ventriculostomy catheter with some hemorrhage and edema along the catheter tract. Now mild  hydrocephalus appears slightly improved in comparison to recent CT head. No convincing transependymal flow of CSF. Moderate periventricular T2/FLAIR hyperintensities are nonspecific but most likely secondary to chronic microvascular ischemic disease. Mildly increased volume of intraventricular hemorrhage layering in the occipital horns. No evidence of acute infarct, mass lesion or substantial midline shift. Vascular: Major arterial flow voids are maintained skull base. Skull and upper cervical spine: Normal marrow signal. Sinuses/Orbits: No mastoid effusions.  No acute orbital findings. IMPRESSION: 1. When comparing across modalities, similar size of an intraparenchymal hemorrhage centered in the right thalamus and smaller intraparenchymal hemorrhage in the left basal ganglia. 2. Mildly increased volume of intraventricular hemorrhage layering in the occipital horns. 3. Right frontal approach ventriculostomy catheter with some hemorrhage and edema along the catheter tract. Now mild hydrocephalus appears slightly improved in comparison to recent CT head. No convincing transependymal flow of CSF. Electronically Signed   By: Gilmore GORMAN Molt M.D.   On: 01/03/2024 23:39   ECHOCARDIOGRAM COMPLETE Result Date: 01/03/2024    ECHOCARDIOGRAM REPORT   Patient Name:   NAUTICA HOTZ Date of Exam: 01/03/2024 Medical Rec #:  [id number]      Height:       65.0 in Accession #:    [id number]     Weight:       123.7 lb Date of Birth:  1960/08/22  BSA:          1.613 m Patient Age:    63 years       BP:           136/89 mmHg Patient Gender: F              HR:           76 bpm. Exam Location:  Inpatient Procedure: 2D Echo, Color Doppler and Cardiac Doppler (Both Spectral and Color            Flow Doppler were utilized during procedure). Indications:    CHF- Acute Systolic I50.21  History:        Patient has prior history of Echocardiogram examinations, most                 recent 05/12/2023.  Sonographer:    Tinnie Gosling RDCS  Referring Phys: [id number] OMAR M [place]I IMPRESSIONS  1. Left ventricular ejection fraction, by estimation, is 40%. The left ventricle has mild to moderately decreased function. The left ventricle demonstrates global hypokinesis. There is mild concentric left ventricular hypertrophy. Left ventricular diastolic parameters are consistent with Grade I diastolic dysfunction (impaired relaxation).  2. Right ventricular systolic function is mildly reduced. The right ventricular size is mildly enlarged. Tricuspid regurgitation signal is inadequate for assessing PA pressure.  3. The mitral valve is normal in structure. No evidence of mitral valve regurgitation. No evidence of mitral stenosis.  4. The aortic valve is tricuspid. Aortic valve regurgitation is not visualized. No aortic stenosis is present.  5. The inferior vena cava is normal in size with <50% respiratory variability, suggesting right atrial pressure of 8 mmHg.  6. A small pericardial effusion is present. The pericardial effusion is localized near the right ventricle. FINDINGS  Left Ventricle: Left ventricular ejection fraction, by estimation, is 40%. The left ventricle has mild to moderately decreased function. The left ventricle demonstrates global hypokinesis. The left ventricular internal cavity size was normal in size. There is mild concentric left ventricular hypertrophy. Left ventricular diastolic parameters are consistent with Grade I diastolic dysfunction (impaired relaxation). Right Ventricle: The right ventricular size is mildly enlarged. No increase in right ventricular wall thickness. Right ventricular systolic function is mildly reduced. Tricuspid regurgitation signal is inadequate for assessing PA pressure. Left Atrium: Left atrial size was normal in size. Right Atrium: Right atrial size was normal in size. Pericardium: A small pericardial effusion is present. The pericardial effusion is localized near the right ventricle. Mitral Valve: The  mitral valve is normal in structure. Mild mitral annular calcification. No evidence of mitral valve regurgitation. No evidence of mitral valve stenosis. Tricuspid Valve: The tricuspid valve is normal in structure. Tricuspid valve regurgitation is trivial. Aortic Valve: The aortic valve is tricuspid. Aortic valve regurgitation is not visualized. No aortic stenosis is present. Pulmonic Valve: The pulmonic valve was normal in structure. Pulmonic valve regurgitation is not visualized. Aorta: The aortic root is normal in size and structure. Venous: The inferior vena cava is normal in size with less than 50% respiratory variability, suggesting right atrial pressure of 8 mmHg. IAS/Shunts: No atrial level shunt detected by color flow Doppler.  LEFT VENTRICLE PLAX 2D LVIDd:         3.90 cm   Diastology LVIDs:         3.20 cm   LV e' medial:    1.92 cm/s LV PW:         1.20 cm   LV E/e' medial:  45.1 LV IVS:        1.20 cm   LV e' lateral:   2.82 cm/s LVOT diam:     1.80 cm   LV E/e' lateral: 30.7 LV SV:         31 LV SV Index:   19 LVOT Area:     2.54 cm  RIGHT VENTRICLE            IVC RV S prime:     8.66 cm/s  IVC diam: 2.00 cm TAPSE (M-mode): 1.3 cm LEFT ATRIUM             Index        RIGHT ATRIUM           Index LA diam:        2.70 cm 1.67 cm/m   RA Area:     11.00 cm LA Vol (A2C):   29.3 ml 18.17 ml/m  RA Volume:   25.60 ml  15.87 ml/m LA Vol (A4C):   33.1 ml 20.53 ml/m LA Biplane Vol: 33.6 ml 20.84 ml/m  AORTIC VALVE LVOT Vmax:   111.00 cm/s LVOT Vmean:  75.100 cm/s LVOT VTI:    0.123 m  AORTA Ao Root diam: 2.80 cm Ao Asc diam:  3.00 cm MITRAL VALVE MV Area (PHT): 2.46 cm     SHUNTS MV Decel Time: 309 msec     Systemic VTI:  0.12 m MV E velocity: 86.60 cm/s   Systemic Diam: 1.80 cm MV A velocity: 120.00 cm/s MV E/A ratio:  0.72 Dalton McleanMD Electronically signed by Ezra Kanner Signature Date/Time: 01/03/2024/7:15:49 PM    Final    US  EKG SITE RITE Result Date: 01/03/2024 If Site Rite image not  attached, placement could not be confirmed due to current cardiac rhythm.  CT HEAD WO CONTRAST ( ) Result Date: 01/03/2024 CLINICAL DATA:  63 year old female with altered mental status, lethargy, acute intracranial hemorrhage. EXAM: CT HEAD WITHOUT CONTRAST TECHNIQUE: Contiguous axial images were obtained from the base of the skull through the vertex without intravenous contrast. RADIATION DOSE REDUCTION: This exam was performed according to the departmental dose-optimization program which includes automated exposure control, adjustment of the mA and/or kV according to patient size and/or use of iterative reconstruction technique. COMPARISON:  Head CT and CTA head and neck earlier today. FINDINGS: Brain: Residual intravascular contrast. Hyperdense hemorrhage centered in the medial right deep gray nuclei at the thalamus. Intra-axial hematoma roughly 32 x 25 x 33 mm (AP by transverse by CC), for estimated intra-axial blood volume of 13 mL. This compares to 11 mL 0220 hours today. Overall hematoma size and configuration not significantly changed. Surrounding edema. Stable regional mass effect. Small volume of intraventricular extension of blood redemonstrated. Increased conspicuity of small layering hematocrit in the occipital horns. Lateral ventriculomegaly, stable. Third ventricle partially effaced, stable. Fourth ventricle is stable. No significant midline shift.  Basilar cisterns remain patent. Vascular related enhancement suspected in the contralateral left basal ganglia on series 2, image 16, appears stable from CTA images 0 234 hours today. And no mass effect or edema there. No convincing new intracranial hemorrhage. Vascular: Calcified atherosclerosis at the skull base. Residual intravascular contrast. Skull: Intact.  No acute osseous abnormality identified. Sinuses/Orbits: Visualized paranasal sinuses and mastoids are stable and well aerated. Other: Some retained secretions in the visible pharynx,  intubated on chest radiograph today. No acute orbit or scalp soft tissue finding. IMPRESSION: 1. Medial Right thalamic hematoma, estimated 13 mL and slightly larger since  0220 hours today. Intraventricular blood volume not significantly changed. Stable ventricle size, and as before difficult to exclude a degree of acute lateral ventriculomegaly. 2. Residual intravascular contrast. Hyperdense left basal ganglia appears to be postcontrast enhancement and is stable from the CTA images 0234 hours today. 3. No new intracranial abnormality identified. Electronically Signed   By: VEAR Hurst M.D.   On: 01/03/2024 05:49   DG Chest Portable 1 View Result Date: 01/03/2024 CLINICAL DATA:  Ventilator dependent respiratory failure. Check intubation, NGT. EXAM: PORTABLE CHEST 1 VIEW COMPARISON:  CTA chest today at 2:27 a.m. FINDINGS: 3:59 a.m. ETT tip is 3.0 cm from the carina. NGT extends well into the stomach but neither the side-hole or tip are in view. Cardiomegaly.  No vascular congestion or edema are seen. Small right apical opacity consistent with infiltrate or scarring versus a subsolid nodule. Rest of the lungs are clear. The sulci are sharp. Mild aortic tortuosity and atherosclerosis. Otherwise unremarkable mediastinum.  Advanced thoracic spondylosis. IMPRESSION: 1. ETT tip 3.0 cm from the carina. 2. NGT extends well into the stomach but neither the side-hole or tip are in view. 3. Small right apical opacity consistent with infiltrate or scarring versus a subsolid nodule. 4. Cardiomegaly. Electronically Signed   By: Francis Quam M.D.   On: 01/03/2024 04:14   CT Head Wo Contrast Result Date: 01/03/2024 EXAM: CTA Head and Neck with Intravenous Contrast. CT Head without Contrast. CLINICAL HISTORY: Mental status change, unknown cause. Near syncope. EMS called for lethargy and AMS. Pt reports cocaine use approx 3 hours ago. TECHNIQUE: Axial CTA images of the head and neck performed with intravenous contrast. MIP  reconstructed images were created and reviewed. Axial computed tomography images of the head/brain performed without intravenous contrast. Note: Per PQRS, the description of internal carotid artery percent stenosis, including 0 percent or normal exam, is based on [place] [employer] Symptomatic Carotid Endarterectomy Trial (NASCET) criteria. Dose reduction technique was used including one or more of the following: automated exposure control, adjustment of mA and kV according to patient size, and/or iterative reconstruction. CONTRAST: 100 mL iohexol  (OMNIPAQUE ) 350 MG/ML injection. COMPARISON: None provided. 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 The hematoma measures 3.0 x 1.9 x 2.5 cm (approximately 7.5 mL). No herniation. VENTRICLES: Primarily hydrocephalus. ORBITS: The orbits are unremarkable. SINUSES AND MASTOIDS: The paranasal sinuses and mastoid air cells are clear. CTA NECK: COMMON CAROTID ARTERIES: No significant stenosis. No dissection or occlusion. INTERNAL CAROTID ARTERIES: No hemodynamically significant stenosis by NASCET criteria. Mild atherosclerotic calcification of both carotid bifurcations. No dissection or occlusion. VERTEBRAL ARTERIES: Poor visualization of the V1 segment of the left vertebral artery. Poor opacification of the V4 segment of the left vertebral artery. No significant stenosis. No dissection or occlusion. CTA HEAD: ANTERIOR CEREBRAL ARTERIES: No significant stenosis. No occlusion. No aneurysm. MIDDLE CEREBRAL ARTERIES: No significant stenosis. No occlusion. No aneurysm. POSTERIOR CEREBRAL ARTERIES: No significant stenosis. No occlusion. No aneurysm. BASILAR ARTERY: No significant stenosis. No occlusion. No aneurysm. OTHER: Focal opacity in the right lung apex better characterized on concomitant chest CT. SOFT TISSUES: No acute finding. No masses or lymphadenopathy. BONES: No acute osseous abnormality. IMPRESSION:  1. 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 No herniation. 2. No emergent large vessel occlusion. 3. Poor opacification of the v1 and v4 segments of the left vertebral artery. 4. Mild atherosclerotic calcification of both  carotid bifurcations without hemodynamically significant stenosis. 5. Chronic ischemic white matter changes. Findings discussed with Dr. Garnette Halo at 2:58 AM on 01/03/2024. Electronically signed by: Franky Stanford MD 01/03/2024 02:59 AM EDT RP Workstation: [id number]   CT ANGIO HEAD NECK W WO CM Result Date: 01/03/2024 EXAM: CTA Head and Neck with Intravenous Contrast. CT Head without Contrast. CLINICAL HISTORY: Mental status change, unknown cause. Near syncope. EMS called for lethargy and AMS. Pt reports cocaine use approx 3 hours ago. TECHNIQUE: Axial CTA images of the head and neck performed with intravenous contrast. MIP reconstructed images were created and reviewed. Axial computed tomography images of the head/brain performed without intravenous contrast. Note: Per PQRS, the description of internal carotid artery percent stenosis, including 0 percent or normal exam, is based on [place] Symptomatic Carotid Endarterectomy Trial (NASCET) criteria. Dose reduction technique was used including one or more of the following: automated exposure control, adjustment of mA and kV according to patient size, and/or iterative reconstruction. CONTRAST: 100 mL iohexol  (OMNIPAQUE ) 350 MG/ML injection. COMPARISON: None provided. 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 The hematoma measures 3.0 x 1.9 x 2.5 cm (approximately 7.5 mL). No herniation. VENTRICLES: Primarily hydrocephalus. ORBITS: The orbits are unremarkable. SINUSES AND MASTOIDS: The paranasal sinuses and mastoid air cells are  clear. CTA NECK: COMMON CAROTID ARTERIES: No significant stenosis. No dissection or occlusion. INTERNAL CAROTID ARTERIES: No hemodynamically significant stenosis by NASCET criteria. Mild atherosclerotic calcification of both carotid bifurcations. No dissection or occlusion. VERTEBRAL ARTERIES: Poor visualization of the V1 segment of the left vertebral artery. Poor opacification of the V4 segment of the left vertebral artery. No significant stenosis. No dissection or occlusion. CTA HEAD: ANTERIOR CEREBRAL ARTERIES: No significant stenosis. No occlusion. No aneurysm. MIDDLE CEREBRAL ARTERIES: No significant stenosis. No occlusion. No aneurysm. POSTERIOR CEREBRAL ARTERIES: No significant stenosis. No occlusion. No aneurysm. BASILAR ARTERY: No significant stenosis. No occlusion. No aneurysm. OTHER: Focal opacity in the right lung apex better characterized on concomitant chest CT. SOFT TISSUES: No acute finding. No masses or lymphadenopathy. BONES: No acute osseous abnormality. IMPRESSION: 1. 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 No herniation. 2. No emergent large vessel occlusion. 3. Poor opacification of the v1 and v4 segments of the left vertebral artery. 4. Mild atherosclerotic calcification of both carotid bifurcations without hemodynamically significant stenosis. 5. Chronic ischemic white matter changes. Findings discussed with Dr. Garnette Halo at 2:58 AM on 01/03/2024. Electronically signed by: Franky Stanford MD 01/03/2024 02:59 AM EDT RP Workstation: [id number]   CT Angio Chest/Abd/Pel for Dissection W and/or Wo Contrast Result Date: 01/03/2024 EXAM: CTA CHEST, ABDOMEN AND PELVIS WITH AND WITHOUT CONTRAST 01/03/2024 02:45:00 AM TECHNIQUE: CTA of the chest was performed with and without the administration of intravenous contrast. CTA of the abdomen and pelvis was  performed with and without the administration of intravenous contrast. Multiplanar reformatted images are provided for review. MIP images are provided for review. Automated exposure control, iterative reconstruction, and/or weight based adjustment of the mA/kV was utilized to reduce the radiation dose to as low as reasonably achievable. COMPARISON: CTA chest dated 09/09/2021. CLINICAL HISTORY: Acute aortic syndrome (AAS) suspected. Near syncope. EMS called for lethargy and AMS. Pt reports cocaine use approx 3 hours ago. FINDINGS: VASCULATURE: AORTA: No thoracoabdominal aortic aneurysm or dissection. PULMONARY ARTERIES: No pulmonary embolism within the limits of this exam. GREAT VESSELS OF AORTIC ARCH: No  acute finding. No dissection. No arterial occlusion or significant stenosis. CELIAC TRUNK: No acute finding. No occlusion or significant stenosis. SUPERIOR MESENTERIC ARTERY: No acute finding. No occlusion or significant stenosis. INFERIOR MESENTERIC ARTERY: No acute finding. No occlusion or significant stenosis. RENAL ARTERIES: No acute finding. No occlusion or significant stenosis. ILIAC ARTERIES: No acute finding. No occlusion or significant stenosis. CHEST: MEDIASTINUM: Mild cardiomegaly. No mediastinal lymphadenopathy. The heart and pericardium demonstrate no acute abnormality. LUNGS AND PLEURA: Mild patchy opacities in the posterior upper and lower lobes, favoring atelectasis. Mild centrilobular emphysematous changes, upper lung predominant. 2.4 cm right apical opacity (image 17), favoring pleural parenchymal scarring on coronal imaging, but new from remote prior. Correlate for radiation changes to this region. Otherwise, consider follow up CT chest in 3 months. No evidence of pleural effusion or pneumothorax. THORACIC BONES AND SOFT TISSUES: Mild degenerative changes of the lower thoracic spine. No acute soft tissue abnormality. ABDOMEN AND PELVIS: LIVER: The liver is unremarkable. GALLBLADDER AND BILE DUCTS:  Gallbladder is unremarkable. No biliary ductal dilatation. SPLEEN: The spleen is unremarkable. PANCREAS: The pancreas is unremarkable. ADRENAL GLANDS: Bilateral adrenal glands demonstrate no acute abnormality. KIDNEYS, URETERS AND BLADDER: 3.0 cm simple left upper pole renal cyst, benign (Bosniak 1). No stones in the kidneys or ureters. No hydronephrosis. No perinephric or periureteral stranding. Urinary bladder is unremarkable. GI AND BOWEL: Normal appendix. Stomach and duodenal sweep demonstrate no acute abnormality. There is no bowel obstruction. No abnormal bowel wall thickening or distension. REPRODUCTIVE: Uterus is within normal limits. PERITONEUM AND RETROPERITONEUM: No ascites or free air. LYMPH NODES: No lymphadenopathy. ABDOMINAL BONES AND SOFT TISSUES: No acute abnormality of the bones. No acute soft tissue abnormality. IMPRESSION: 1. No thoracoabdominal aortic aneurysm or dissection. 2. Suspected right apical pleural parenchymal scarring, new from prior. Correlate for radiation changes to this region. Otherwise, consider follow-up CT chest in 3 months. Electronically signed by: Pinkie Pebbles MD 01/03/2024 02:56 AM EDT RP Workstation: [id number]   DG Chest Portable 1 View Result Date: 01/03/2024 EXAM: 1 VIEW XRAY OF THE CHEST 01/03/2024 02:19:00 AM COMPARISON: 05/12/2023 CLINICAL HISTORY: cp. ams FINDINGS: LUNGS AND PLEURA: No focal pulmonary opacity. No frank interstitial edema. No pleural effusion. No pneumothorax. HEART AND MEDIASTINUM: Cardiomegaly. BONES AND SOFT TISSUES: No acute osseous abnormality. IMPRESSION: 1. No acute abnormalities. 2. Cardiomegaly. No frank interstitial edema. Electronically signed by: Pinkie Pebbles MD 01/03/2024 02:22 AM EDT RP Workstation: [id number]    Labs:  CBC: Recent Labs    01/09/24 0550 01/10/24 0544 01/15/24 0623 01/24/24 0137  WBC 9.6 9.9 8.9 7.4  HGB 14.7 14.6 13.1 12.7  HCT 43.9 43.9 39.7 39.4  PLT 346 361 408* 444*    COAGS: No  results for input(s): INR, APTT in the last 8760 hours.  BMP: Recent Labs    01/15/24 0623 01/19/24 1212 01/21/24 0603 01/24/24 0137  NA 135 137 139 141  K 4.6 4.5 4.2 4.2  CL 103 103 104 106  CO2 21* 22 20* 24  GLUCOSE 130* 145* 152* 137*  BUN 37* 32* 33* 29*  CALCIUM 9.3 9.3 9.3 9.2  CREATININE 0.99 0.83 0.83 0.71  GFRNONAA >60 >60 >60 >60    LIVER FUNCTION TESTS: Recent Labs    05/12/23 0653 05/13/23 0619 01/03/24 0245  BILITOT 0.8 0.7 0.6  AST 41 34 24  ALT 49* 43 14  ALKPHOS 162* 159* 66  PROT 6.4* 6.6 7.0  ALBUMIN 2.9* 2.8* 2.9*    TUMOR MARKERS: No results for input(s): AFPTM,  CEA, CA199, CHROMGRNA in the last 8760 hours.  Assessment and Plan: Dysphagia in the setting of recent hemorrhagic stroke with persistent deficits, poor PO intake. IR consulted for gastrostomy tube placement.  Imaging reviewed by Dr. Karalee who approves for the procedure.  She currently has an NGT in place.  On SQ heparin  but no antiplatelets given hemorrhagic stroke.    Will hold TF and morning heparin  for possible procedure 10/16 as schedule allows.   Risks and benefits image guided gastrostomy tube placement was discussed with the patient including, but not limited to the need for a barium enema during the procedure, bleeding, infection, peritonitis and/or damage to adjacent structures.  All of the patient's questions were answered, patient is agreeable to proceed.  Consent signed and in chart.   Thank you for this interesting consult.  I greatly enjoyed meeting Amanda [name] and look forward to participating in their care.  A copy of this report was sent to the requesting provider on this date.  Electronically Signed: Toni Demo Sue-Ellen Dorrance Sellick, PA 01/26/2024, 1:22 PM   I spent a total of 40 Minutes    in face to face in clinical consultation, greater than 50% of which was counseling/coordinating care for dysphagia.

## 2024-01-26 NOTE — NC FL2 (Signed)
 [place]  MEDICAID FL2 LEVEL OF CARE FORM     IDENTIFICATION  Patient Name: Amanda [name] Birthdate: May 13, 1960 Sex: female Admission Date (Current Location): 01/03/2024  [facility] and [place] Number:  Producer, television/film/video and Address:  [facility], [address] [place], [place] [address]      Provider Number: [id number]  Attending Physician Name and Address:  Jillian Buttery, MD  Relative Name and Phone Number:       Current Level of Care: Hospital Recommended Level of Care: Skilled Nursing Facility Prior Approval Number:    Date Approved/Denied:   PASRR Number: [id number]  Discharge Plan: SNF    Current Diagnoses: Patient Active Problem List   Diagnosis Date Noted   ICH (intracerebral hemorrhage) (HCC) 01/03/2024   Hemorrhagic stroke (HCC) 01/03/2024   Malnutrition of moderate degree 01/03/2024   Severe hypertension 05/12/2023   Anemia associated with acute blood loss 05/30/2022   Symptomatic anemia 05/29/2022   Epistaxis 05/29/2022   HTN (hypertension) 05/29/2022   Chronic combined systolic and diastolic heart failure (HCC) 03/18/2022   Hypertensive emergency 09/08/2021   Acute pulmonary edema (HCC) 09/08/2021   Tobacco abuse 09/08/2021   Cocaine abuse (HCC) 09/08/2021   History of alcohol abuse 09/08/2021   Abnormal chest x-ray 09/08/2021    Orientation RESPIRATION BLADDER Height & Weight     Self  Normal Incontinent Weight: 120 lb 13 oz (54.8 kg) Height:  5' 5 (165.1 cm)  BEHAVIORAL SYMPTOMS/MOOD NEUROLOGICAL BOWEL NUTRITION STATUS      Incontinent Feeding tube (Osmolite 1.5 at 40 mL/hr)  AMBULATORY STATUS COMMUNICATION OF NEEDS Skin   Extensive Assist Verbally Surgical wounds (closed head, staples)                       Personal Care Assistance Level of Assistance  Bathing, Feeding, Dressing Bathing Assistance: Maximum assistance Feeding assistance: Maximum assistance Dressing Assistance: Maximum assistance      Functional Limitations Info  Speech     Speech Info: Impaired (dysarthria)    SPECIAL CARE FACTORS FREQUENCY  PT (By licensed PT), OT (By licensed OT), Speech therapy     PT Frequency: 5x/wk OT Frequency: 5x/wk     Speech Therapy Frequency: 5x/wk      Contractures Contractures Info: Not present    Additional Factors Info  Code Status, Allergies, Psychotropic, Insulin  Sliding Scale Code Status Info: Full Allergies Info: NKA Psychotropic Info: Seroquel  25mg  daily at bed Insulin  Sliding Scale Info: see DC summary       Current Medications (01/26/2024):  This is the current hospital active medication list Current Facility-Administered Medications  Medication Dose Route Frequency Provider Last Rate Last Admin   acetaminophen  (TYLENOL ) tablet 650 mg  650 mg Oral Q4H PRN Lindzen, Eric, MD   650 mg at 01/18/24 0841   Or   acetaminophen  (TYLENOL ) 160 MG/5ML solution 650 mg  650 mg Per Tube Q4H PRN Lindzen, Eric, MD   650 mg at 01/23/24 9176   Or   acetaminophen  (TYLENOL ) suppository 650 mg  650 mg Rectal Q4H PRN Merrianne Locus, MD   650 mg at 01/05/24 0358   amLODipine  (NORVASC ) tablet 10 mg  10 mg Per Tube Daily Briana Elgin LABOR, MD   10 mg at 01/26/24 0940   benzonatate  (TESSALON ) capsule 200 mg  200 mg Oral TID PRN Hattar, Laith N, MD   200 mg at 01/26/24 0545   carvedilol  (COREG ) tablet 25 mg  25 mg Per  Tube BID WC Briana Elgin LABOR, MD   25 mg at 01/26/24 0940   feeding supplement (OSMOLITE 1.5 CAL) liquid 1,000 mL  1,000 mL Per Tube Continuous Smith, Daniel C, MD 40 mL/hr at 01/25/24 0034 Infusion Verify at 01/25/24 0034   feeding supplement (PROSource TF20) liquid 60 mL  60 mL Per Tube Daily Claudene Toribio BROCKS, MD   60 mL at 01/26/24 0941   heparin  injection 5,000 Units  5,000 Units Subcutaneous Q8H Sethi, Pramod S, MD   5,000 Units at 01/26/24 0545   hydrALAZINE  (APRESOLINE ) injection 10 mg  10 mg Intravenous Q4H PRN Rojelio Nest, DO   10 mg at 01/26/24 0036   hydrOXYzine   (ATARAX ) tablet 10 mg  10 mg Per Tube TID PRN Briana Elgin LABOR, MD   10 mg at 01/26/24 9051   insulin  aspart (novoLOG ) injection 0-9 Units  0-9 Units Subcutaneous Q4H Rojelio Nest, DO   1 Units at 01/26/24 9062   ipratropium-albuterol  (DUONEB) 0.5-2.5 (3) MG/3ML nebulizer solution 3 mL  3 mL Nebulization Q4H PRN Sharie Bourbon, MD   3 mL at 01/06/24 1647   labetalol  (NORMODYNE ) injection 10-20 mg  10-20 mg Intravenous Q2H PRN Remi Pippin, NP   20 mg at 01/20/24 1733   menthol (CEPACOL) lozenge 3 mg  1 lozenge Oral BID PRN Hattar, Zola SAILOR, MD       OLANZapine  (ZYPREXA ) injection 5 mg  5 mg Intravenous Q8H PRN Paliwal, Aditya, MD   5 mg at 01/17/24 1505   Oral care mouth rinse  15 mL Mouth Rinse 4 times per day Zaida Zola SAILOR, MD   15 mL at 01/26/24 0940   Oral care mouth rinse  15 mL Mouth Rinse PRN Hattar, Zola SAILOR, MD       polyethylene glycol (MIRALAX  / GLYCOLAX ) packet 17 g  17 g Per Tube Daily PRN Briana Elgin LABOR, MD       QUEtiapine  (SEROQUEL ) tablet 25 mg  25 mg Per Tube QHS Briana Elgin LABOR, MD   25 mg at 01/25/24 2020   sacubitril -valsartan  (ENTRESTO ) 97-103 mg per tablet  1 tablet Per Tube BID Briana Elgin LABOR, MD   1 tablet at 01/26/24 9060   spironolactone  (ALDACTONE ) tablet 25 mg  25 mg Per Tube Daily Briana Elgin LABOR, MD   25 mg at 01/26/24 9060     Discharge Medications: Please see discharge summary for a list of discharge medications.  Relevant Imaging Results:  Relevant Lab Results:   Additional Information SS#: [id number]  Almarie CHRISTELLA Goodie, LCSW

## 2024-01-26 NOTE — Plan of Care (Signed)
  Problem: Skin Integrity: Goal: Risk for impaired skin integrity will decrease Outcome: Progressing   Problem: Nutrition: Goal: Adequate nutrition will be maintained Outcome: Progressing   

## 2024-01-26 NOTE — Progress Notes (Signed)
 PROGRESS NOTE  Amanda [name]  FMW:[id number] DOB: May 05, 1960 DOA: 01/03/2024 PCP: Patient, No Pcp Per   Brief Narrative: Patient is a 63 year old female with history of hypertension, cocaine abuse, diabetes type 2 who presented with lethargy, near syncope.  Found to be hypotensive on presentation.  CT showed acute intraparenchymal hemorrhage , intraventricular hemorrhage and hydrocephalus.  Patient was admitted to ICU for close blood pressure management.  Neurosurgery consulted.  Status post right frontal ventriculostomy.  EVD eventually removed on 10/3.  Hospital course remarkable for dysphagia.  Speech therapy following.  Dietitian/speech therapy recommending PEG placement because she is not able to tolerate anything by mouth.  PT/OT recommending SNF on discharge.  Plan for PEG placement.  IR consulted . 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  Neurosurgery consulted.  Repeat CT head significant for increase in the size of medial right thalamic hematoma.  Status post right frontal ventriculostomy on 9/22 which was eventually clamped on 10/1 and removed on 10/3.  Has left hemiparesis  Hypertensive urgency: Currently blood pressure stable.  Severely hypertensive on presentation.  Managed with Cleviprex  drip in ICU.  Currently on amlodipine , Coreg , aspirin , Entresto   Acute metabolic encephalopathy: Secondary to intraventricular hemorrhage, hydrocephalus.  Monitor  mentation.  Chronic combined systolic/diastolic CHF: Echo showed EF of 40% with associated grade 1 diastolic dysfunction.  Continue current medications  Acute hypoxic respiratory failure: Was intubated on 9/22 for airway protection.  Extubated on 9/23.  Currently on room air. Was tachypneic few days ago, chest x-ray unremarkable.  Possibly anxiety related.  Type 2 diabetes: Recent A1c of 6.6.  Currently on sliding scale  Cocaine abuse: UDS positive for cocaine  Dysphagia: Started on feeding via core track.  Also on dysphagia 3 diet.  Speech therapy following and recommended PEG placement because she has poor oral intake.  Daughter agreeable.  IR consulted for PEG placement  Abnormal CT chest:Imaging significant for right apical pleural scarring versus density. Recommendation for repeat CT scan in 3 months from 01/03/2024.    Nutrition Problem: Moderate Malnutrition Etiology: social / environmental circumstances (polysubstance abuse)    DVT prophylaxis:SCD's Start: 01/11/24 2301 heparin  injection 5,000 Units Start: 01/05/24 1015 Place and maintain sequential compression device Start: 01/03/24 0611 SCDs Start: 01/03/24 0429     Code Status: Full Code  Family Communication: Daughter [name] on phone on 10/13, 10/14  Patient status:Inpatient  Patient is from :Home  Anticipated discharge to:SNF  Estimated DC date: Not sure   Consultants: Neurosurgery ,pccm  Procedures: Intubation/extubation, right frontal ventriculostomy, core track placement  Antimicrobials:  Anti-infectives (From admission, onward)    Start     Dose/Rate Route Frequency Ordered Stop   01/13/24 0600  ceFAZolin (ANCEF) IVPB 2g/100 mL premix        2 g 200 mL/hr over 30 Minutes Intravenous On call to O.R. 01/11/24 2300 01/13/24 0557       Subjective:  Patient seen and examined at bedside today.  Lying in bed.  Comfortable.  Opens her eyes on calling her name.  Does not follow much  commands.  Continues  to have poor oral intake.  Objective: Vitals:   01/26/24 0418 01/26/24 0551 01/26/24 0809 01/26/24 1103  BP: 135/88  (!) 156/83 111/78  Pulse: 77  75 72  Resp: 19  20 14   Temp: 98.7 F (37.1 C)  97.9 F (36.6 C) 97.8 F (36.6 C)  TempSrc:   Oral Axillary  SpO2: 100%  96% 99%  Weight:  54.8 kg    Height:        Intake/Output Summary (Last 24 hours) at 01/26/2024 1125 Last data filed at 01/25/2024 1700 Gross per 24 hour  Intake 80 ml  Output 500 ml  Net -420 ml   Filed Weights   01/24/24 0500 01/25/24 0434 01/26/24 0551  Weight: 56 kg 53.5 kg 54.8 kg    Examination:   General exam: Overall comfortable, not in distress HEENT: PERRL,cortrack Respiratory system:  no wheezes or crackles  Cardiovascular system: S1 & S2 heard, RRR.  Gastrointestinal system: Abdomen is nondistended, soft and nontender. Central nervous system: Awake but not oriented, left hemiplegia, inconsistent with following commands Extremities: No edema, no clubbing ,no cyanosis Skin: No rashes, no ulcers,no icterus     Data Reviewed: I have personally reviewed following labs and imaging studies  CBC: Recent Labs  Lab 01/24/24 0137  WBC 7.4  HGB 12.7  HCT 39.4  MCV 89.3  PLT 444*   Basic Metabolic Panel: Recent Labs  Lab 01/19/24 1212 01/21/24 0603 01/24/24 0137  NA 137 139 141  K 4.5 4.2 4.2  CL 103 104 106  CO2 22 20* 24  GLUCOSE 145* 152* 137*  BUN 32* 33* 29*  CREATININE 0.83 0.83 0.71  CALCIUM 9.3 9.3 9.2  MG  --  2.3  --   PHOS  --  4.1  --      No results found for this or any previous visit (from the past 240 hours).   Radiology Studies: CT ABDOMEN WO CONTRAST Result Date: 01/25/2024 CLINICAL DATA:  Evaluate anatomy for possible gastrostomy tube placement EXAM: CT ABDOMEN WITHOUT CONTRAST TECHNIQUE: Multidetector CT imaging of the abdomen was performed following the standard protocol without IV contrast. RADIATION DOSE REDUCTION: This exam was performed according to  the departmental dose-optimization program which includes automated exposure control, adjustment of the mA and/or kV according to patient size and/or use of iterative reconstruction technique. COMPARISON:  Recent prior CT scan of the chest, abdomen and pelvis 01/03/2024 FINDINGS: Lower chest: Cardiomegaly. Mild dependent atelectasis versus infiltrate in both lower lobes. Hepatobiliary: No focal liver abnormality is seen. No gallstones, gallbladder wall thickening, or biliary dilatation. Pancreas: Unremarkable. No pancreatic ductal dilatation or surrounding inflammatory changes. Spleen: Normal in size without focal abnormality. Adrenals/Urinary Tract: Normal adrenal glands. No hydronephrosis or nephrolithiasis. Simple cyst in the interpolar collecting system of the left kidney again noted. Stomach/Bowel: Colonic interposition. The splenic flexure of the colon overlies a portion of the stomach. However, the gastric antrum does extend to the anterior abdominal wall above the redundant transverse colon. This should allow for percutaneous gastrostomy tube placement. A post pyloric feeding tube is present with the tip in the descending duodenum. No evidence of bowel obstruction. Vascular/Lymphatic: Limited evaluation in the absence of intravenous contrast. Scattered atherosclerotic calcifications. No suspicious lymphadenopathy. Other: No evidence of ascites or large abdominal wall hernia. Musculoskeletal: No acute fracture or aggressive appearing lytic or blastic osseous lesion. IMPRESSION: 1. Anatomy is suitable for percutaneous gastrostomy tube placement. 2. Additional ancillary findings as above. Electronically Signed  By: Wilkie Lent M.D.   On: 01/25/2024 15:55    Scheduled Meds:  amLODipine   10 mg Per Tube Daily   carvedilol   25 mg Per Tube BID WC   feeding supplement (PROSource TF20)  60 mL Per Tube Daily   heparin  injection (subcutaneous)  5,000 Units Subcutaneous Q8H   insulin  aspart  0-9 Units  Subcutaneous Q4H   mouth rinse  15 mL Mouth Rinse 4 times per day   QUEtiapine   25 mg Per Tube QHS   sacubitril -valsartan   1 tablet Per Tube BID   spironolactone   25 mg Per Tube Daily   Continuous Infusions:  feeding supplement (OSMOLITE 1.5 CAL) 40 mL/hr at 01/25/24 0034     LOS: 23 days   Ivonne Mustache, MD [facility] P10/15/2025, 11:25 AM

## 2024-01-27 ENCOUNTER — Inpatient Hospital Stay (HOSPITAL_COMMUNITY)

## 2024-01-27 DIAGNOSIS — I610 Nontraumatic intracerebral hemorrhage in hemisphere, subcortical: Secondary | ICD-10-CM | POA: Diagnosis not present

## 2024-01-27 HISTORY — PX: IR GASTROSTOMY TUBE MOD SED: IMG625

## 2024-01-27 LAB — GLUCOSE, CAPILLARY
Glucose-Capillary: 115 mg/dL — ABNORMAL HIGH (ref 70–99)
Glucose-Capillary: 118 mg/dL — ABNORMAL HIGH (ref 70–99)
Glucose-Capillary: 132 mg/dL — ABNORMAL HIGH (ref 70–99)
Glucose-Capillary: 139 mg/dL — ABNORMAL HIGH (ref 70–99)
Glucose-Capillary: 85 mg/dL (ref 70–99)
Glucose-Capillary: 94 mg/dL (ref 70–99)

## 2024-01-27 MED ORDER — FENTANYL CITRATE (PF) 100 MCG/2ML IJ SOLN
INTRAMUSCULAR | Status: AC | PRN
  Administered 2024-01-27 (×2): 25 ug via INTRAVENOUS

## 2024-01-27 MED ORDER — IOHEXOL 300 MG/ML  SOLN
50.0000 mL | Freq: Once | INTRAMUSCULAR | Status: AC | PRN
  Administered 2024-01-27: 20 mL

## 2024-01-27 MED ORDER — CEFAZOLIN SODIUM-DEXTROSE 2-4 GM/100ML-% IV SOLN
INTRAVENOUS | Status: AC | PRN
Start: 2024-01-27 — End: 2024-01-27
  Administered 2024-01-27: 2 g via INTRAVENOUS

## 2024-01-27 MED ORDER — CEFAZOLIN SODIUM-DEXTROSE 2-4 GM/100ML-% IV SOLN
INTRAVENOUS | Status: AC
Start: 2024-01-27 — End: 2024-01-27
  Filled 2024-01-27: qty 100

## 2024-01-27 MED ORDER — MIDAZOLAM HCL 2 MG/2ML IJ SOLN
INTRAMUSCULAR | Status: AC | PRN
  Administered 2024-01-27: 1 mg via INTRAVENOUS
  Administered 2024-01-27: .5 mg via INTRAVENOUS

## 2024-01-27 MED ORDER — FENTANYL CITRATE (PF) 100 MCG/2ML IJ SOLN
INTRAMUSCULAR | Status: AC
  Filled 2024-01-27: qty 2

## 2024-01-27 MED ORDER — MIDAZOLAM HCL 2 MG/2ML IJ SOLN
INTRAMUSCULAR | Status: AC
  Filled 2024-01-27: qty 2

## 2024-01-27 MED ORDER — LIDOCAINE-EPINEPHRINE 1 %-1:100000 IJ SOLN
INTRAMUSCULAR | Status: AC
  Filled 2024-01-27: qty 1

## 2024-01-27 NOTE — Procedures (Signed)
 Interventional Radiology Procedure Note  Procedure: Placement of percutaneous 50F balloon-retention gastrostomy tube.  Complications: None  EBL:  < 5 mL  Recommendations: - NPO for 4 hours after placement - Maintain G-tube to low wall suction for 4 hours after placement - May advance diet as tolerated and begin using tube 4 hours after placement - Gastropexy sutures are absorbable and do not require removal  Marliss Coots, MD Pager: [phone]

## 2024-01-27 NOTE — Progress Notes (Signed)
 Off unit for peg placement

## 2024-01-27 NOTE — TOC Progression Note (Signed)
 Transition of Care [facility]) - Progression Note    Patient Details  Name: MIEKA LEATON MRN: [id number] Date of Birth: 1960-04-21  [facility] CM/SW Contact  Almarie CHRISTELLA [place]ie, [place] Phone Number: 01/27/2024, 9:44 AM  Clinical Narrative:   CSW following for disposition. Patient with no local SNF offers, referral faxed out further. CSW to follow.    Expected Discharge Plan: Skilled Nursing Facility Barriers to Discharge: English as a second language teacher, Continued Medical Work up, Inadequate or no insurance, Facility will not accept until restraint criteria met, SNF Pending bed offer               Expected Discharge Plan and Services In-house Referral: Clinical Social Work   Post Acute Care Choice: Skilled Nursing Facility Living arrangements for the past 2 months: Single Family Home                                       Social Drivers of Health (SDOH) Interventions SDOH Screenings   Food Insecurity: No Food Insecurity (07/16/2023)   Received from [facility]  Housing: Low Risk  (07/16/2023)   Received from [facility]  Transportation Needs: No Transportation Needs (07/16/2023)   Received from [facility]  Utilities: Not At Risk (07/16/2023)   Received from [facility]  Alcohol Screen: Low Risk  (05/14/2023)  Financial Resource Strain: Low Risk  (07/16/2023)   Received from [facility]  Social Connections: Unknown (10/19/2022)   Received from [facility]  Tobacco Use: High Risk (01/26/2024)    Readmission Risk Interventions    03/26/2022   10:50 AM  Readmission Risk Prevention Plan  Transportation Screening Complete  HRI or Home Care Consult Complete  Social Work Consult for Recovery Care Planning/Counseling Complete  Palliative Care Screening Not Applicable  Medication Review [employer]) Referral to Pharmacy

## 2024-01-27 NOTE — Progress Notes (Signed)
 PROGRESS NOTE  Amanda [name]  FMW:[id number] DOB: 12/10/1960 DOA: 01/03/2024 PCP: Patient, No Pcp Per   Brief Narrative: Patient is a 63 year old female with history of hypertension, cocaine abuse, diabetes type 2 who presented with lethargy, near syncope.  Found to be hypotensive on presentation.  CT showed acute intraparenchymal hemorrhage , intraventricular hemorrhage and hydrocephalus.  Patient was admitted to ICU for close blood pressure management.  Neurosurgery consulted.  Status post right frontal ventriculostomy.  EVD eventually removed on 10/3.  Hospital course remarkable for dysphagia.  Speech therapy following.  Dietitian/speech therapy recommending PEG placement because she is not able to tolerate anything by mouth.  PT/OT recommending SNF on discharge.  Underwent PEG placement by IR on 10/16 . 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  Neurosurgery consulted.  Repeat CT head significant for increase in the size of medial right thalamic hematoma.  Status post right frontal ventriculostomy on 9/22 which was eventually clamped on 10/1 and removed on 10/3.  Has left hemiparesis  Hypertensive urgency: Currently blood pressure stable.  Severely hypertensive on presentation.  Managed with Cleviprex  drip in ICU.  Currently on amlodipine , Coreg , aspirin , Entresto   Acute metabolic encephalopathy: Secondary to intraventricular hemorrhage, hydrocephalus.  Monitor  mentation.  Chronic combined systolic/diastolic CHF: Echo showed EF of 40% with associated grade 1 diastolic dysfunction.  Continue current medications  Acute hypoxic respiratory failure: Was intubated on 9/22 for airway protection.  Extubated on 9/23.  Currently on room air. Was tachypneic few days ago, chest x-ray unremarkable.  Possibly anxiety related.  Type 2 diabetes: Recent A1c of 6.6.  Currently on sliding scale  Cocaine abuse: UDS positive for cocaine  Dysphagia: Started on feeding via core track.  Also on dysphagia 3 diet.  Speech therapy following and recommended PEG placement because she has poor oral intake.  Daughter agreeable.  S/P PEG placement on 10/16  Abnormal CT chest:Imaging significant for right apical pleural scarring versus density. Recommendation for repeat CT scan in 3 months from 01/03/2024.    Nutrition Problem: Moderate Malnutrition Etiology: social / environmental circumstances (polysubstance abuse)    DVT prophylaxis:SCD's Start: 01/11/24 2301 heparin  injection 5,000 Units Start: 01/05/24 1015 Place and maintain sequential compression device Start: 01/03/24 0611 SCDs Start: 01/03/24 0429     Code Status: Full Code  Family Communication: Daughter [name] on phone on 10/13, 10/14  Patient status:Inpatient  Patient is from :Home  Anticipated discharge to:SNF  Estimated DC date: Not sure   Consultants: Neurosurgery ,pccm  Procedures: Intubation/extubation, right frontal ventriculostomy, core track placement  Antimicrobials:  Anti-infectives (From admission, onward)    Start     Dose/Rate Route Frequency Ordered Stop   01/27/24 0807  ceFAZolin (ANCEF) IVPB 2g/100 mL premix        over 30 Minutes Intravenous Continuous PRN 01/27/24 0807 01/27/24 0807   01/13/24 0600  ceFAZolin (ANCEF) IVPB 2g/100 mL premix        2 g 200 mL/hr over 30 Minutes Intravenous On call to O.R. 01/11/24 2300 01/13/24 0557  Subjective:  Patient seen and  examined at the bedside today before she went for PEG.  She was comfortable.  Lying in bed.  Inconsistent with following commands.  Not in any Distress.  Objective: Vitals:   01/27/24 0805 01/27/24 0810 01/27/24 0815 01/27/24 1126  BP: (!) 124/92 136/79 121/79 (!) 150/99  Pulse: 70 71 71 87  Resp: (!) 25 (!) 24 (!) 25 (!) 22  Temp:    (!) 97.5 F (36.4 C)  TempSrc:    Oral  SpO2: 93% 92% 91% 94%  Weight:      Height:        Intake/Output Summary (Last 24 hours) at 01/27/2024 1136 Last data filed at 01/27/2024 0400 Gross per 24 hour  Intake 1934 ml  Output 750 ml  Net 1184 ml   Filed Weights   01/24/24 0500 01/25/24 0434 01/26/24 0551  Weight: 56 kg 53.5 kg 54.8 kg    Examination:   General exam: Overall comfortable, not in distress HEENT: PERRL,coretrack Respiratory system:  no wheezes or crackles  Cardiovascular system: S1 & S2 heard, RRR.  Gastrointestinal system: Abdomen is nondistended, soft and nontender. Central nervous system: Alert and but not oriented, left hemiplegia, inconsistent with following commands Extremities: No edema, no clubbing ,no cyanosis Skin: No rashes, no ulcers,no icterus     Data Reviewed: I have personally reviewed following labs and imaging studies  CBC: Recent Labs  Lab 01/24/24 0137  WBC 7.4  HGB 12.7  HCT 39.4  MCV 89.3  PLT 444*   Basic Metabolic Panel: Recent Labs  Lab 01/21/24 0603 01/24/24 0137  NA 139 141  K 4.2 4.2  CL 104 106  CO2 20* 24  GLUCOSE 152* 137*  BUN 33* 29*  CREATININE 0.83 0.71  CALCIUM 9.3 9.2  MG 2.3  --   PHOS 4.1  --      No results found for this or any previous visit (from the past 240 hours).   Radiology Studies: IR GASTROSTOMY TUBE MOD SED Result Date: 01/27/2024 INDICATION: 63 year old female with history of stroke and subsequent dysphagia. EXAM: PERC PLACEMENT GASTROSTOMY MEDICATIONS: Ancef 2 gm IV; Antibiotics were administered within 1 hour of the procedure.  ANESTHESIA/SEDATION: Versed  1.5 mg IV; Fentanyl  50 mcg IV Moderate Sedation Time:  10 The patient was continuously monitored during the procedure by the interventional radiology nurse under my direct supervision. CONTRAST:  20mL OMNIPAQUE  IOHEXOL  300 MG/ML SOLN - administered into the gastric lumen. FLUOROSCOPY TIME:  Two mGy reference air kerma COMPLICATIONS: None immediate. PROCEDURE: Informed written consent was obtained from the patient after a thorough discussion of the procedural risks, benefits and alternatives. All questions were addressed. Maximal Sterile barrier Technique was utilized including caps, mask, sterile gowns, sterile gloves, sterile drape, hand hygiene and skin antiseptic. A timeout was performed prior to the initiation of the procedure. The patient was placed on the procedure table in the supine position. Pre-procedure abdominal film confirmed visualization of the transverse colon. The patient was prepped and draped in usual sterile fashion. The stomach was insufflated with air via the indwelling nasogastric tube. Under fluoroscopy, a puncture site was selected and local analgesia achieved with 1% lidocaine  infiltrated subcutaneously. Under fluoroscopic guidance, a gastropexy needle was passed into the stomach and the T-bar suture was released. Entry into the stomach was confirmed with fluoroscopy, aspiration of air, and injection of contrast material. This was repeated with an additional gastropexy suture (for a total of 2 fasteners). At the center of these gastropexy  sutures, a dermatotomy was performed. An 18 gauge needle was passed into the stomach at the site of this dermatotomy, and position within the gastric lumen again confirmed under fluoroscopy using aspiration of air and contrast injection. An Amplatz guidewire was passed through this needle and intraluminal placement within the stomach was confirmed by fluoroscopy. The needle was removed. Over the guidewire, the percutaneous tract  was dilated using a 10 mm non-compliant balloon. The balloon was deflated, then pushed into the gastric lumen followed in concert by the 20 Fr gastrostomy tube. The retention balloon of the percutaneous gastrostomy tube was inflated with 10 mL of sterile water . The tube was withdrawn until the retention balloon was at the edge of the gastric lumen. The external bumper was brought to the abdominal wall. Contrast was injected through the gastrostomy tube, confirming intraluminal positioning. The patient tolerated the procedure well without any immediate post-procedural complications. IMPRESSION: Technically successful placement of 20 Fr gastrostomy tube. Ester Sides, MD [facility] Radiology Electronically Signed   By: Ester Sides M.D.   On: 01/27/2024 09:11   CT ABDOMEN WO CONTRAST Result Date: 01/25/2024 CLINICAL DATA:  Evaluate anatomy for possible gastrostomy tube placement EXAM: CT ABDOMEN WITHOUT CONTRAST TECHNIQUE: Multidetector CT imaging of the abdomen was performed following the standard protocol without IV contrast. RADIATION DOSE REDUCTION: This exam was performed according to the departmental dose-optimization program which includes automated exposure control, adjustment of the mA and/or kV according to patient size and/or use of iterative reconstruction technique. COMPARISON:  Recent prior CT scan of the chest, abdomen and pelvis 01/03/2024 FINDINGS: Lower chest: Cardiomegaly. Mild dependent atelectasis versus infiltrate in both lower lobes. Hepatobiliary: No focal liver abnormality is seen. No gallstones, gallbladder wall thickening, or biliary dilatation. Pancreas: Unremarkable. No pancreatic ductal dilatation or surrounding inflammatory changes. Spleen: Normal in size without focal abnormality. Adrenals/Urinary Tract: Normal adrenal glands. No hydronephrosis or nephrolithiasis. Simple cyst in the interpolar collecting system of the left kidney  again noted. Stomach/Bowel: Colonic interposition. The splenic flexure of the colon overlies a portion of the stomach. However, the gastric antrum does extend to the anterior abdominal wall above the redundant transverse colon. This should allow for percutaneous gastrostomy tube placement. A post pyloric feeding tube is present with the tip in the descending duodenum. No evidence of bowel obstruction. Vascular/Lymphatic: Limited evaluation in the absence of intravenous contrast. Scattered atherosclerotic calcifications. No suspicious lymphadenopathy. Other: No evidence of ascites or large abdominal wall hernia. Musculoskeletal: No acute fracture or aggressive appearing lytic or blastic osseous lesion. IMPRESSION: 1. Anatomy is suitable for percutaneous gastrostomy tube placement. 2. Additional ancillary findings as above. Electronically Signed   By: Wilkie Lent M.D.   On: 01/25/2024 15:55    Scheduled Meds:  amLODipine   10 mg Per Tube Daily   carvedilol   25 mg Per Tube BID WC   feeding supplement (PROSource TF20)  60 mL Per Tube Daily   heparin  injection (subcutaneous)  5,000 Units Subcutaneous Q8H   insulin  aspart  0-9 Units Subcutaneous Q4H   mouth rinse  15 mL Mouth Rinse 4 times per day   QUEtiapine   25 mg Per Tube QHS   sacubitril -valsartan   1 tablet Per Tube BID   spironolactone   25 mg Per Tube Daily   Continuous Infusions:  feeding supplement (OSMOLITE 1.5 CAL) 1,000 mL (01/27/24 0000)     LOS: 24 days   Ivonne Mustache, MD [facility] P10/16/2025, 11:36 AM

## 2024-01-27 NOTE — Plan of Care (Signed)
  Problem: Education: Goal: Ability to describe self-care measures that may prevent or decrease complications (Diabetes Survival Skills Education) will improve Outcome: Progressing   Problem: Fluid Volume: Goal: Ability to maintain a balanced intake and output will improve Outcome: Progressing   Problem: Health Behavior/Discharge Planning: Goal: Ability to identify and utilize available resources and services will improve Outcome: Progressing   Problem: Skin Integrity: Goal: Risk for impaired skin integrity will decrease Outcome: Progressing   Problem: Tissue Perfusion: Goal: Adequacy of tissue perfusion will improve Outcome: Progressing

## 2024-01-28 DIAGNOSIS — I610 Nontraumatic intracerebral hemorrhage in hemisphere, subcortical: Secondary | ICD-10-CM | POA: Diagnosis not present

## 2024-01-28 LAB — GLUCOSE, CAPILLARY
Glucose-Capillary: 156 mg/dL — ABNORMAL HIGH (ref 70–99)
Glucose-Capillary: 182 mg/dL — ABNORMAL HIGH (ref 70–99)
Glucose-Capillary: 172 mg/dL — ABNORMAL HIGH (ref 70–99)
Glucose-Capillary: 179 mg/dL — ABNORMAL HIGH (ref 70–99)
Glucose-Capillary: 122 mg/dL — ABNORMAL HIGH (ref 70–99)

## 2024-01-28 MED ORDER — FREE WATER
60.0000 mL | Status: DC
  Administered 2024-01-28 – 2024-02-18 (×128): 60 mL

## 2024-01-28 NOTE — Plan of Care (Signed)
  Problem: Coping: Goal: Ability to adjust to condition or change in health will improve Outcome: Progressing   Problem: Fluid Volume: Goal: Ability to maintain a balanced intake and output will improve Outcome: Progressing   Problem: Nutritional: Goal: Progress toward achieving an optimal weight will improve Outcome: Progressing

## 2024-01-28 NOTE — Progress Notes (Signed)
  IR Note   G tube placed in IR 10/16 Pt resting- no response  Site of G tube in clean and dry NT No bleeding T tacs intact No sign of infection Not in use at this time  Call us  if you need anything

## 2024-01-28 NOTE — Progress Notes (Signed)
 Speech Language Pathology Treatment: Dysphagia  Patient Details Name: Amanda [name] MRN: [id number] DOB: [birth year] Today's Date: 01/28/2024 Time: 0900-0915 SLP Time Calculation (min) (ACUTE ONLY): 15 min  Assessment / Plan / Recommendation Clinical Impression  Pt lethargic, verbally greeted me and only says yes if offered water . Otherwise pt doesn't respond to any efforts to elicit language or social interaction.  She is moaning, if asked if she is in pain, pt doesn't verbally respond, but did gesture to her stomach. Pt accepts straw sips of water  without difficulty. When offered pudding she allowed herself to be fed once, but did not seem to want it and moaning increased. Continue to offer PO, particularly water , when alert. Mechanical soft and thin liquid diet can continue. Hopeful that as pts cognition improves she will show more interest in food.   HPI HPI: [name] with polysubstance/cocaine abuse 3 hrs before presentation, who called EMS for AMS, lethargy and near syncope. Her condition deteriorated (unable to protect her airway and more lethargic) and got intubated. 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 HTN, CHF, DM-2.      SLP Plan  Continue with current plan of care          Recommendations  Diet recommendations: Thin liquid;Dysphagia 3 (mechanical soft) Liquids provided via: Cup;Straw Medication Administration: Via alternative means Supervision: Full supervision/cueing for compensatory strategies Compensations: Slow rate;Small sips/bites;Follow solids with liquid                  Oral care BID   Frequent or constant Supervision/Assistance Dysphagia, unspecified (R13.10)     Continue with current plan of care     Narayan Scull, Consuelo Fitch  01/28/2024,  12:59 PM

## 2024-01-28 NOTE — Progress Notes (Signed)
 Physical Therapy Treatment Patient Details Name: Amanda [name] MRN: [id number] DOB: 02-08-1961 Today's Date: 01/28/2024   History of Present Illness 63 yo female presents to [facility] on 9/22 with lethargy and near-syncope s/p cocaine and opioid ingestion. 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 S/p R frontal ventriculostomy, burr holes 9/22. ETT 9/22-9/23. PMHx of cocaine abuse, CHF, CKD, chronic cough, HTN and DM2.    PT Comments  Pt received in supine, smiling, and agreeable to session. Pt demonstrates improved alertness and participation this session with pt stating I want to walk. Pt able to sit to EOB and stand with mod A due to L inattention and impaired balance. Pt continues to demonstrate L lateral lean in sitting and standing requiring assist to correct. Pt able to perform 3 stands, but demonstrates difficulty with RW management. Pt states I'm hungry and when asked if she wants to eat something she states yeah, RN notified. Pt continues to benefit from PT services to progress toward functional mobility goals.     If plan is discharge home, recommend the following: Two people to help with walking and/or transfers;Two people to help with bathing/dressing/bathroom;Supervision due to cognitive status;Direct supervision/assist for financial management;Assistance with feeding;Direct supervision/assist for medications management;Assist for transportation;Help with stairs or ramp for entrance   Can travel by private vehicle     No  Equipment Recommendations  Hospital bed;Wheelchair (measurements PT);Wheelchair cushion (measurements PT);Hoyer lift;BSC/3in1    Recommendations for Other Services       Precautions / Restrictions Precautions Precautions: Fall;Other (comment) Recall of Precautions/Restrictions: Impaired Precaution/Restrictions Comments: PEG, SBP < 160, bil mitt, NGT, pushes to  L Restrictions Weight Bearing Restrictions Per Provider Order: No     Mobility  Bed Mobility Overal bed mobility: Needs Assistance Bed Mobility: Sit to Supine, Supine to Sit     Supine to sit: Mod assist, HOB elevated, Used rails Sit to supine: Max assist   General bed mobility comments: Cues for sequencing and technique. Assist for L extremities and trunk elevation to sit EOB. Assist for BLE elevation back to bed to return to supine    Transfers Overall transfer level: Needs assistance Equipment used: Rolling walker (2 wheels) Transfers: Sit to/from Stand Sit to Stand: Mod assist, From elevated surface           General transfer comment: STS from EOB x3 with mod A to power up and assist to facilitate hip extension for upright posture. Cues and assist for RW management due to pt unable to grip with LUE and continues to try to pick up RW. Pt able to take 2 small lateral steps towards HOB with max A for stability and weight shifting    Ambulation/Gait               General Gait Details: unable to progress   Stairs             Wheelchair Mobility     Tilt Bed    Modified Rankin (Stroke Patients Only) Modified Rankin (Stroke Patients Only) Pre-Morbid Rankin Score: No symptoms Modified Rankin: Severe disability     Balance Overall balance assessment: Needs assistance Sitting-balance support: Feet supported, Single extremity supported, Bilateral upper extremity supported Sitting balance-Leahy Scale: Poor Sitting balance - Comments: Pt initially min A with pt maintaining close to midline with posterior lean, but increases L lateral and posterior leans with fatigue requiring mod-max A Postural control: Posterior lean, Left lateral lean Standing balance support: Bilateral  upper extremity supported, Reliant on assistive device for balance Standing balance-Leahy Scale: Poor Standing balance comment: reliant on UE and external support                             Communication Communication Communication: Impaired Factors Affecting Communication: Reduced clarity of speech;Difficulty expressing self  Cognition Arousal: Alert Behavior During Therapy: WFL for tasks assessed/performed   PT - Cognitive impairments: Awareness, Attention, Safety/Judgement, Sequencing, Initiation, Problem solving                       PT - Cognition Comments: improved verbalization and participation this session. Cues for awareness and L inattention Following commands: Impaired Following commands impaired: Follows one step commands inconsistently, Follows one step commands with increased time    Cueing Cueing Techniques: Verbal cues, Tactile cues, Visual cues, Gestural cues  Exercises      General Comments        Pertinent Vitals/Pain Pain Assessment Pain Assessment: Faces Faces Pain Scale: Hurts little more Pain Location: stomach (PEG site) Pain Descriptors / Indicators: Grimacing, Sore Pain Intervention(s): Monitored during session, Repositioned     PT Goals (current goals can now be found in the care plan section) Acute Rehab PT Goals Patient Stated Goal: did not state PT Goal Formulation: Patient unable to participate in goal setting Time For Goal Achievement: 01/31/24 Progress towards PT goals: Progressing toward goals    Frequency    Min 2X/week       AM-PAC PT 6 Clicks Mobility   Outcome Measure  Help needed turning from your back to your side while in a flat bed without using bedrails?: A Lot Help needed moving from lying on your back to sitting on the side of a flat bed without using bedrails?: A Lot Help needed moving to and from a bed to a chair (including a wheelchair)?: Total Help needed standing up from a chair using your arms (e.g., wheelchair or bedside chair)?: A Lot Help needed to walk in hospital room?: Total Help needed climbing 3-5 steps with a railing? : Total 6 Click Score: 9    End of Session  Equipment Utilized During Treatment: Gait belt Activity Tolerance: Patient tolerated treatment well Patient left: in bed;with call bell/phone within reach;with bed alarm set;with restraints reapplied Nurse Communication: Mobility status PT Visit Diagnosis: Unsteadiness on feet (R26.81);Difficulty in walking, not elsewhere classified (R26.2);Other abnormalities of gait and mobility (R26.89);Other symptoms and signs involving the nervous system (R29.898);Muscle weakness (generalized) (M62.81)     Time: 8459-8385 PT Time Calculation (min) (ACUTE ONLY): 34 min  Charges:    $Therapeutic Activity: 23-37 mins PT General Charges $$ ACUTE PT VISIT: 1 Visit                     Darryle George, PTA Acute Rehabilitation Services Secure Chat Preferred  [phone]    Darryle George 01/28/2024, 4:28 PM

## 2024-01-28 NOTE — Progress Notes (Signed)
 Nutrition Follow-up  DOCUMENTATION CODES:  Non-severe (moderate) malnutrition in context of social or environmental circumstances (polysubstance abuse)  INTERVENTION:  Continue tube feeding via PEG: Osmolite 1.5 at 40 ml/h (960 ml per day) Prosource TF20 60 ml 1x/d Free water : 60mL every 4 hours Provides 1520 kcal, 80 gm protein, 731 ml free water  daily (1091mL flush + TF) Continue current diet as ordered per SLP Encourage PO intake as able  When ready to discharge, adjust to bolus feeds: Osmolite 1.5, administer 1 carton 5x/d (5 cartons total per day) Administer 1/2 carton of formula for first two feeds and then increase to a full carton Free water : 50mL before and after each feed ( 5x/d) This will provide 1775 kcal, 75g protein, and of water  per day ( free water  TF+flush)  NUTRITION DIAGNOSIS:  Moderate Malnutrition related to social / environmental circumstances (polysubstance abuse) as evidenced by mild muscle depletion, moderate fat depletion, mild fat depletion, moderate muscle depletion. - remains applicable  GOAL:  Patient will meet greater than or equal to 90% of their needs - met with TF at goal  MONITOR:  TF tolerance, Vent status, Labs  REASON FOR ASSESSMENT:  Consult Enteral/tube feeding initiation and management  ASSESSMENT:  Pt with hx HTN, CHF, type 2 diabetes, polysubstance abuse, and pulmonary edema. Admitted with AMS, lethargy, and deteriorating condition requiring intubation; diagnosed ICH.  9/22 - admitted, intubated, EVD placed 9/23 - extubated 9/24 - s/p cortrak placement; tip gastric  9/25 - Diet advanced to Dysphagia 2/thin; TF stopped 9/26 - back down to Full liquids, TF resumed  10/13- upgraded to DYS 3, but SLP recommends PEG 10/16 - PEG placed in IR  Pt resting in bed at the time of assessment. Does not interact when spoken to. Had PEG placed yesterday, tube feeds currently infusing at 49mL/h which is goal. CM working on  placement, no bed offers yet. When pt is getting close to discharge will adjust to bolus feeds.   Admit weight: 56.1 kg  Current weight: 53 kg    Average Meal Intake: 10/6-10/8: 0% intake x 4 recorded meals 10/6-10/12: 0% intake x 7 recorded meals 10/14-10/15: 14% x 4 recorded meals  Nutritionally Relevant Medications: Scheduled Meds:  PROSource TF20  60 mL Per Tube Daily   free water   60 mL Per Tube Q4H   insulin  aspart  0-9 Units Subcutaneous Q4H   spironolactone   25 mg Per Tube Daily   Continuous Infusions:  feeding supplement (OSMOLITE 1.5 CAL) 40 mL/hr at 01/28/24 0446   PRN Meds: polyethylene glycol  Labs Reviewed: CBG ranges from 85-182 mg/dL over the last 24 hours HgbA1c 6.6%   Diet Order:   Diet Order             DIET DYS 3 Fluid consistency: Thin  Diet effective now                  EDUCATION NEEDS:  Not appropriate for education at this time  Skin:  Skin Assessment: Reviewed RN Assessment  Last BM:  10/15 - type 6  Height:  Ht Readings from Last 1 Encounters:  01/03/24 5' 5 (1.651 m)    Weight:  Wt Readings from Last 1 Encounters:  01/28/24 53 kg    Ideal Body Weight:  56.8 kg  BMI:  Body mass index is 19.44 kg/m.  Estimated Nutritional Needs:  Kcal:  1500-1700 Protein:  80-90 grams Fluid:  1.5-1.7L   Vernell Lukes, RD, LDN, CNSC Registered Dietitian II Please  reach out via secure chat

## 2024-01-28 NOTE — Progress Notes (Addendum)
 PROGRESS NOTE  HOANG REICH  FMW:[id number] DOB: [birth year] DOA: 01/03/2024 PCP: Patient, No Pcp Per   Brief Narrative: Patient is a 63 year old female with history of hypertension, cocaine abuse, diabetes type 2 who presented with lethargy, near syncope.  Found to be hypotensive on presentation.  CT showed acute intraparenchymal hemorrhage , intraventricular hemorrhage and hydrocephalus.  Patient was admitted to ICU for close blood pressure management.  Neurosurgery consulted.  Status post right frontal ventriculostomy.  EVD eventually removed on 10/3.  Hospital course remarkable for dysphagia.  Speech therapy following.  Dietitian/speech therapy recommending PEG placement because she is not able to tolerate anything by mouth.  PT/OT recommending SNF on discharge.  Underwent PEG placement by IR on 10/16.TOC following . 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  Neurosurgery consulted.  Repeat CT head significant for increase in the size of medial right thalamic hematoma.  Status post right frontal ventriculostomy on 9/22 which was eventually clamped on 10/1 and removed on 10/3.  Has left hemiparesis  Hypertensive urgency: Currently blood pressure stable.  Severely hypertensive on presentation.  Managed with Cleviprex  drip in ICU.  Currently on amlodipine , Coreg , aspirin , Entresto   Acute metabolic encephalopathy: Secondary to intraventricular hemorrhage, hydrocephalus.   Monitor mentation.  Chronic combined systolic/diastolic CHF: Echo showed EF of 40% with associated grade 1 diastolic dysfunction.  Continue current medications  Acute hypoxic respiratory failure: Was intubated on 9/22 for airway protection.  Extubated on 9/23.  Currently on room air. Was tachypneic few days ago, chest x-ray unremarkable.  Possibly anxiety related.  Type 2 diabetes: Recent A1c of 6.6.  Currently on sliding scale  Cocaine abuse: UDS positive for cocaine  Dysphagia: Started on feeding via core track.  Speech therapy following and recommended PEG placement because she had poor oral intake.  Daughter agreeable.  S/P PEG placement on 10/16  Abnormal CT chest:Imaging significant for right apical pleural scarring versus density. Recommendation for repeat CT scan in 3 months from 01/03/2024.    Nutrition Problem: Moderate Malnutrition Etiology: social / environmental circumstances (polysubstance abuse)    DVT prophylaxis:SCD's Start: 01/11/24 2301 heparin  injection 5,000 Units Start: 01/05/24 1015 Place and maintain sequential compression device Start: 01/03/24 0611 SCDs Start: 01/03/24 0429     Code Status: Full Code  Family Communication: Daughter [name] on phone on 10/13, 10/14  Patient status:Inpatient  Patient is from :Home  Anticipated discharge to:SNF  Estimated DC date: Not sure   Consultants: Neurosurgery ,pccm  Procedures: Intubation/extubation, right frontal ventriculostomy, core track placement  Antimicrobials:  Anti-infectives (From admission, onward)    Start     Dose/Rate Route Frequency Ordered Stop   01/27/24 0807  ceFAZolin (ANCEF) IVPB 2g/100 mL premix        over 30 Minutes Intravenous Continuous PRN 01/27/24 0807 01/27/24 0807   01/13/24 0600  ceFAZolin (ANCEF) IVPB 2g/100 mL premix        2 g 200 mL/hr over 30 Minutes Intravenous On call to O.R. 01/11/24 2300 01/13/24 0557       Subjective:  Patient seen and  examined at bedside  today.  Underwent PEG yesterday.  PEG functioning well.  Not in any current distress.  Sleeping comfortably in bed  Objective: Vitals:   01/27/24 2339 01/28/24 0336 01/28/24 0500 01/28/24 0821  BP: (!) 115/99 107/81  120/80  Pulse: 99 95  92  Resp: 20 17  20   Temp: 99 F (37.2 C) 98.9 F (37.2 C)  (!) 97.4 F (36.3 C)  TempSrc:  Oral  Oral  SpO2: 96% 94%  92%  Weight:   53 kg   Height:        Intake/Output Summary (Last 24 hours) at 01/28/2024 1133 Last data filed at 01/28/2024 0500 Gross per 24 hour  Intake 1093.67 ml  Output 700 ml  Net 393.67 ml   Filed Weights   01/25/24 0434 01/26/24 0551 01/28/24 0500  Weight: 53.5 kg 54.8 kg 53 kg    Examination:      General exam: Overall comfortable, not in distress HEENT: PERRL, coretrack Respiratory system:  no wheezes or crackles  Cardiovascular system: S1 & S2 heard, RRR.  Gastrointestinal system: Abdomen is nondistended, soft and nontender.PEG Central nervous system: Awake but not fully oriented, left hemiplegia, inconsistent with following commands Extremities: No edema, no clubbing ,no cyanosis Skin: No rashes, no ulcers,no icterus     Data Reviewed: I have personally reviewed following labs and imaging studies  CBC: Recent Labs  Lab 01/24/24 0137  WBC 7.4  HGB 12.7  HCT 39.4  MCV 89.3  PLT 444*   Basic Metabolic Panel: Recent Labs  Lab 01/24/24 0137  NA 141  K 4.2  CL 106  CO2 24  GLUCOSE 137*  BUN 29*  CREATININE 0.71  CALCIUM 9.2     No results found for this or any previous visit (from the past 240 hours).   Radiology Studies: IR GASTROSTOMY TUBE MOD SED Result Date: 01/27/2024 INDICATION: 63 year old female with history of stroke and subsequent dysphagia. EXAM: PERC PLACEMENT GASTROSTOMY MEDICATIONS: Ancef 2 gm IV; Antibiotics were administered within 1 hour of the procedure. ANESTHESIA/SEDATION: Versed  1.5 mg IV; Fentanyl  50 mcg IV Moderate Sedation Time:  10 The patient was  continuously monitored during the procedure by the interventional radiology nurse under my direct supervision. CONTRAST:  20mL OMNIPAQUE  IOHEXOL  300 MG/ML SOLN - administered into the gastric lumen. FLUOROSCOPY TIME:  Two mGy reference air kerma COMPLICATIONS: None immediate. PROCEDURE: Informed written consent was obtained from the patient after a thorough discussion of the procedural risks, benefits and alternatives. All questions were addressed. Maximal Sterile barrier Technique was utilized including caps, mask, sterile gowns, sterile gloves, sterile drape, hand hygiene and skin antiseptic. A timeout was performed prior to the initiation of the procedure. The patient was placed on the procedure table in the supine position. Pre-procedure abdominal film confirmed visualization of the transverse colon. The patient was prepped and draped in usual sterile fashion. The stomach was insufflated with air via the indwelling nasogastric tube. Under fluoroscopy, a puncture site was selected and local analgesia achieved with 1% lidocaine  infiltrated subcutaneously. Under fluoroscopic guidance, a gastropexy needle was passed into the stomach and the T-bar suture was released. Entry into the stomach was confirmed with fluoroscopy, aspiration of air, and injection of contrast material. This was repeated with an additional gastropexy suture (for a total of 2 fasteners). At the center of these gastropexy sutures, a dermatotomy was performed. An 18 gauge needle was passed into the stomach at the site of this dermatotomy, and position within the gastric lumen  again confirmed under fluoroscopy using aspiration of air and contrast injection. An Amplatz guidewire was passed through this needle and intraluminal placement within the stomach was confirmed by fluoroscopy. The needle was removed. Over the guidewire, the percutaneous tract was dilated using a 10 mm non-compliant balloon. The balloon was deflated, then pushed into the  gastric lumen followed in concert by the 20 Fr gastrostomy tube. The retention balloon of the percutaneous gastrostomy tube was inflated with 10 mL of sterile water . The tube was withdrawn until the retention balloon was at the edge of the gastric lumen. The external bumper was brought to the abdominal wall. Contrast was injected through the gastrostomy tube, confirming intraluminal positioning. The patient tolerated the procedure well without any immediate post-procedural complications. IMPRESSION: Technically successful placement of 20 Fr gastrostomy tube. Ester Sides, MD [facility] Radiology Electronically Signed   By: Ester Sides M.D.   On: 01/27/2024 09:11    Scheduled Meds:  amLODipine   10 mg Per Tube Daily   carvedilol   25 mg Per Tube BID WC   feeding supplement (PROSource TF20)  60 mL Per Tube Daily   free water   60 mL Per Tube Q4H   heparin  injection (subcutaneous)  5,000 Units Subcutaneous Q8H   insulin  aspart  0-9 Units Subcutaneous Q4H   mouth rinse  15 mL Mouth Rinse 4 times per day   QUEtiapine   25 mg Per Tube QHS   sacubitril -valsartan   1 tablet Per Tube BID   spironolactone   25 mg Per Tube Daily   Continuous Infusions:  feeding supplement (OSMOLITE 1.5 CAL) 40 mL/hr at 01/28/24 0446     LOS: 25 days   Ivonne Mustache, MD [facility] P10/17/2025, 11:33 AM

## 2024-01-29 DIAGNOSIS — I610 Nontraumatic intracerebral hemorrhage in hemisphere, subcortical: Secondary | ICD-10-CM | POA: Diagnosis not present

## 2024-01-29 LAB — CBC
HCT: 40.1 % (ref 36.0–46.0)
Hemoglobin: 13.3 g/dL (ref 12.0–15.0)
MCH: 28.7 pg (ref 26.0–34.0)
MCHC: 33.2 g/dL (ref 30.0–36.0)
MCV: 86.6 fL (ref 80.0–100.0)
Platelets: 284 K/uL (ref 150–400)
RBC: 4.63 MIL/uL (ref 3.87–5.11)
RDW: 14.2 % (ref 11.5–15.5)
WBC: 7.2 K/uL (ref 4.0–10.5)
nRBC: 0 % (ref 0.0–0.2)

## 2024-01-29 LAB — GLUCOSE, CAPILLARY
Glucose-Capillary: 137 mg/dL — ABNORMAL HIGH (ref 70–99)
Glucose-Capillary: 143 mg/dL — ABNORMAL HIGH (ref 70–99)
Glucose-Capillary: 152 mg/dL — ABNORMAL HIGH (ref 70–99)
Glucose-Capillary: 157 mg/dL — ABNORMAL HIGH (ref 70–99)
Glucose-Capillary: 163 mg/dL — ABNORMAL HIGH (ref 70–99)
Glucose-Capillary: 175 mg/dL — ABNORMAL HIGH (ref 70–99)

## 2024-01-29 LAB — BASIC METABOLIC PANEL WITH GFR
Anion gap: 12 (ref 5–15)
BUN: 36 mg/dL — ABNORMAL HIGH (ref 8–23)
CO2: 24 mmol/L (ref 22–32)
Calcium: 9.9 mg/dL (ref 8.9–10.3)
Chloride: 99 mmol/L (ref 98–111)
Creatinine, Ser: 0.86 mg/dL (ref 0.44–1.00)
GFR, Estimated: >60 mL/min (ref >60)
Glucose, Bld: 173 mg/dL — ABNORMAL HIGH (ref 70–99)
Potassium: 4 mmol/L (ref 3.5–5.1)
Sodium: 135 mmol/L (ref 135–145)

## 2024-01-29 NOTE — Plan of Care (Signed)
  Problem: Coping: Goal: Ability to adjust to condition or change in health will improve Outcome: Progressing   Problem: Fluid Volume: Goal: Ability to maintain a balanced intake and output will improve Outcome: Progressing   Problem: Metabolic: Goal: Ability to maintain appropriate glucose levels will improve Outcome: Progressing   Problem: Skin Integrity: Goal: Risk for impaired skin integrity will decrease Outcome: Progressing

## 2024-01-29 NOTE — Progress Notes (Signed)
 Progress Note   Patient: Amanda [name] FMW:[id number] DOB: 07-Aug-1960 DOA: 01/03/2024     26 DOS: the patient was seen and examined on 01/29/2024    Brief hospital course: Patient is a 63 year old female with history of hypertension, cocaine abuse, diabetes type 2 who presented with lethargy, near syncope.  Found to be severely hypertensive, UDS positive for cocaine, with acute intraparenchymal hemorrhage , intraventricular hemorrhage and hydrocephalus.  Patient was admitted to ICU for close blood pressure management.  Neurosurgery consulted.  Status post right frontal ventriculostomy.  EVD eventually removed on 10/3.  Hospital course remarkable for dysphagia.  Speech therapy following.  Dietitian/speech therapy recommending PEG placement because she is not able to tolerate anything by mouth.  PT/OT recommending SNF on discharge.  Underwent PEG placement by IR on 10/16. TOC following   Significant events: 9/22 Admitted and intubated, emergent burr holes and right frontal ventriculostomy placed by neurosurgery due to neurological worsening 9/23 extubated 10/1 EVD clamped 10/3 EVD removed 10/16 PEG placement by IR for worsening dysphagia  Assessment and Plan: Principal Problem:   ICH (intracerebral hemorrhage) (HCC) Active Problems:   Hypertensive emergency   Tobacco abuse   Cocaine abuse (HCC)   History of alcohol abuse   Chronic combined systolic and diastolic heart failure (HCC)   HTN (hypertension)   Hemorrhagic stroke (HCC)   Malnutrition of moderate degree   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 Continues to receive PT. Plan for discharge to SNF.  - DC continuous cardiac monitoring - Still has skin staples in from 10/3 EVD removal; would contact [facility] Monday 10/20 to inquire about removal timeline   Hypertension: Stable.  Continue  medications: Amlodipine  10 mg daily, carvedilol  5 mg twice daily, Entresto .   Dysphagia:  S/P PEG placement on 10/16.  Now has progressed to mechanical soft and thin liquid diet; can offer p.o. particularly water  when alert.  Will continue to monitor.  Chronic combined systolic/diastolic CHF: Stable, continue Entresto  and spironolactone .  Not on aspirin  due to recent hemorrhage.    Type 2 diabetes: Recent A1c of 6.6.  Glucose controlled on sliding scale insulin , needed total 11 units insulin  aspart on 10/17.   Cocaine abuse: Consult TOC for cocaine cessation    Subjective: Patient found laying in bed with nurse at the bedside.  Sleeping and will get me for questions, but does not answer.  Physical Exam: BP 110/72 (BP Location: Left Arm)   Pulse 77   Temp 97.9 F (36.6 C) (Oral)   Resp 18   Ht 5' 5 (1.651 m)   Wt 53.8 kg   LMP 12/24/2015 (Within Days)   SpO2 92%   BMI 19.74 kg/m    General appearance: no distress Cor:  RRR, without murmurs, rubs.no LE edema Resp:  Normal respiratory rate and rhythm.  CTAB without rales or wheezes. Abd:  No TTP or rebound all quadrants.  No masses or organomegaly.   No ascites, distension.No scars.  No striae, dilated veins, rashes, or lesions.  Skin:  cap refill normal, Skin intact without significant rashes or lesions. Neuro: Patient not following commands Psych: Unable to assess  Data Reviewed: BMP, CBC normal 10/18  Recent Labs  Lab 01/24/24 0137 01/29/24 0633  NA 141 135  K 4.2 4.0  CO2 24 24  BUN 29* 36*  CREATININE 0.71 0.86  WBC 7.4 7.2  HGB 12.7 13.3  PLT 444* 284  Author: Betsey CHRISTELLA Helling, MD 01/29/2024 6:57 PM  For on call review [email or web address].

## 2024-01-30 DIAGNOSIS — I5042 Chronic combined systolic (congestive) and diastolic (congestive) heart failure: Secondary | ICD-10-CM | POA: Diagnosis not present

## 2024-01-30 DIAGNOSIS — I619 Nontraumatic intracerebral hemorrhage, unspecified: Secondary | ICD-10-CM | POA: Diagnosis not present

## 2024-01-30 DIAGNOSIS — F1410 Cocaine abuse, uncomplicated: Secondary | ICD-10-CM | POA: Diagnosis not present

## 2024-01-30 DIAGNOSIS — I10 Essential (primary) hypertension: Secondary | ICD-10-CM | POA: Diagnosis not present

## 2024-01-30 LAB — GLUCOSE, CAPILLARY
Glucose-Capillary: 102 mg/dL — ABNORMAL HIGH (ref 70–99)
Glucose-Capillary: 133 mg/dL — ABNORMAL HIGH (ref 70–99)
Glucose-Capillary: 135 mg/dL — ABNORMAL HIGH (ref 70–99)
Glucose-Capillary: 136 mg/dL — ABNORMAL HIGH (ref 70–99)
Glucose-Capillary: 155 mg/dL — ABNORMAL HIGH (ref 70–99)
Glucose-Capillary: 157 mg/dL — ABNORMAL HIGH (ref 70–99)

## 2024-01-30 NOTE — Progress Notes (Addendum)
 Progress Note   Patient: Amanda [name] FMW:[id number] DOB: 1960/11/30 DOA: 01/03/2024     27 DOS: the patient was seen and examined on 01/30/2024    Brief hospital course: Patient is a 63 year old female with history of hypertension, cocaine abuse, diabetes type 2 who presented with lethargy, near syncope.  Found to be severely hypertensive, UDS positive for cocaine, with acute intraparenchymal hemorrhage , intraventricular hemorrhage and hydrocephalus.  Patient was admitted to ICU for close blood pressure management.  Neurosurgery consulted.  Status post right frontal ventriculostomy.  EVD eventually removed on 10/3.  Hospital course remarkable for dysphagia.  Speech therapy following.  Dietitian/speech therapy recommending PEG placement because she is not able to tolerate anything by mouth.  PT/OT recommending SNF on discharge.  Underwent PEG placement by IR on 10/16. TOC following   Significant events: 9/22 Admitted and intubated, emergent burr holes and right frontal ventriculostomy placed by neurosurgery due to neurological worsening 9/23 extubated 10/1 EVD clamped 10/3 EVD removed 10/16 PEG placement by IR for worsening dysphagia  Assessment and Plan: Principal Problem:   ICH (intracerebral hemorrhage) (HCC) Active Problems:   Hypertensive emergency   Tobacco abuse   Cocaine abuse (HCC)   History of alcohol abuse   Chronic combined systolic and diastolic heart failure (HCC)   HTN (hypertension)   Hemorrhagic stroke (HCC)   Malnutrition of moderate degree   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 Continues to receive PT. Plan for discharge to SNF.  - DC continuous cardiac monitoring - Still has skin staples in from 10/3 EVD removal; would contact [facility] Monday 10/20 to inquire about removal timeline   Hypertension: Stable.  Continue  medications: Amlodipine  10 mg daily, carvedilol  5 mg twice daily, Entresto .   Dysphagia:  S/P PEG placement on 10/16.  Now has progressed to mechanical soft and thin liquid diet; can offer p.o. particularly water  when alert.  Will continue to monitor.  Chronic combined systolic/diastolic CHF: Stable, continue Entresto  and spironolactone .  Not on aspirin  due to recent hemorrhage.    Type 2 diabetes: Recent A1c of 6.6.  Glucose controlled on sliding scale insulin , needed total 11 units insulin  aspart on 10/17.   Cocaine abuse: Consult TOC for cocaine cessation    Subjective: Patient found laying in bed, No reported acute issues overnight. VSS   Physical Exam: BP 119/77 (BP Location: Left Arm)   Pulse 77   Temp (P) 97.6 F (36.4 C) (Axillary)   Resp 18   Ht 5' 5 (1.651 m)   Wt 53.8 kg   LMP 12/24/2015 (Within Days)   SpO2 94%   BMI 19.74 kg/m    General appearance: no distress Cor:  RRR, without murmurs, rubs.no LE edema Resp:  Normal respiratory rate and rhythm.  CTAB without rales or wheezes. Abd:  No TTP or rebound all quadrants.  No masses or organomegaly.   No ascites, distension.No scars.  No striae, dilated veins, rashes, or lesions.  Skin:  cap refill normal, Skin intact without significant rashes or lesions. Neuro: Patient not following commands Psych: Unable to assess  Data Reviewed: BMP, CBC normal 10/18  Recent Labs  Lab 01/24/24 0137 01/29/24 0633  NA 141 135  K 4.2 4.0  CO2 24 24  BUN 29* 36*  CREATININE 0.71 0.86  WBC 7.4 7.2  HGB 12.7 13.3  PLT 444* 284    Author: Landon FORBES Baller, MD 01/30/2024 2:42 PM  For on call review [email or web address].

## 2024-01-31 DIAGNOSIS — I610 Nontraumatic intracerebral hemorrhage in hemisphere, subcortical: Secondary | ICD-10-CM | POA: Diagnosis not present

## 2024-01-31 LAB — GLUCOSE, CAPILLARY
Glucose-Capillary: 126 mg/dL — ABNORMAL HIGH (ref 70–99)
Glucose-Capillary: 144 mg/dL — ABNORMAL HIGH (ref 70–99)
Glucose-Capillary: 96 mg/dL (ref 70–99)
Glucose-Capillary: 97 mg/dL (ref 70–99)
Glucose-Capillary: 155 mg/dL — ABNORMAL HIGH (ref 70–99)
Glucose-Capillary: 120 mg/dL — ABNORMAL HIGH (ref 70–99)

## 2024-01-31 NOTE — Progress Notes (Signed)
 Occupational Therapy Treatment Patient Details Name: Amanda [name] MRN: [id number] DOB: 1961-03-11 Today's Date: 01/31/2024   History of present illness 63 yo female presents to [facility] on 9/22 with lethargy and near-syncope s/p cocaine and opioid ingestion. 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 S/p R frontal ventriculostomy, burr holes 9/22. ETT 9/22-9/23. PMHx of cocaine abuse, CHF, CKD, chronic cough, HTN and DM2.   OT comments  Patient seen in order to progress with functional mobility and ADL management. Patent motivated and alert upon arrival, but soiled and requiring bed level peri-care. Patient requiring increased assist for bed mobility (up to max A with rolling) but benefitting from tactile cues for completion. Patient with one instance of swatting at RN, however easily redirected. OT recommendation remains appropriate, will continue to follow.       If plan is discharge home, recommend the following:  Two people to help with walking and/or transfers;A lot of help with bathing/dressing/bathroom;Assistance with cooking/housework;Direct supervision/assist for medications management;Direct supervision/assist for financial management;Assist for transportation;Help with stairs or ramp for entrance   Equipment Recommendations  Other (comment) (defer to next level)    Recommendations for Other Services      Precautions / Restrictions Precautions Precautions: Fall;Other (comment) Recall of Precautions/Restrictions: Impaired Precaution/Restrictions Comments: PEG, SBP < 160, bil mitt, pushes to L Restrictions Weight Bearing Restrictions Per Provider Order: No       Mobility Bed Mobility Overal bed mobility: Needs Assistance Bed Mobility: Rolling Rolling: Max assist         General bed mobility comments: improved initiation at end of session, however up to max A to roll due to  decreased initiation, attention and motor planning    Transfers Overall transfer level: Needs assistance                       Balance                                           ADL either performed or assessed with clinical judgement   ADL                   Upper Body Dressing : Moderate assistance Upper Body Dressing Details (indicate cue type and reason): donning and doffing gown     Toilet Transfer: Maximal assistance Toilet Transfer Details (indicate cue type and reason): bed level rolling Toileting- Clothing Manipulation and Hygiene: Total assistance Toileting - Clothing Manipulation Details (indicate cue type and reason): soiled upon arrival requiring increased assist     Functional mobility during ADLs: Moderate assistance;+2 for physical assistance;+2 for safety/equipment;Cueing for safety;Cueing for sequencing General ADL Comments: Patient seen in order to progress with functional mobility and ADL management. Patent motivated and alert upon arrival, but soiled and requiring bed level peri-care. Patient requiring increased assist for bed mobility (up to max A with rolling) but benefitting from tactile cues for completion. Patient with one instance of swatting at RN, however easily redirected. OT recommendation remains appropriate, will continue to follow.    Extremity/Trunk Assessment              Vision       Perception     Praxis     Communication Communication Communication: Impaired Factors Affecting Communication: Reduced clarity of speech;Difficulty expressing self   Cognition Arousal: Alert Behavior  During Therapy: Restless Cognition: Cognition impaired   Orientation impairments: Situation, Time Awareness: Intellectual awareness impaired Memory impairment (select all impairments): Short-term memory, Working memory Attention impairment (select first level of impairment): Sustained attention Executive functioning  impairment (select all impairments): Initiation, Sequencing, Reasoning, Problem solving OT - Cognition Comments: decreased command following from previous session, requiring multi-modal cues and still inconsistent, swatted at RN one time during session, easily redirected                 Following commands: Impaired Following commands impaired: Follows one step commands inconsistently, Follows one step commands with increased time      Cueing   Cueing Techniques: Verbal cues, Tactile cues, Visual cues, Gestural cues  Exercises      Shoulder Instructions       General Comments VSS on RA    Pertinent Vitals/ Pain       Pain Assessment Pain Assessment: Faces Faces Pain Scale: Hurts a little bit Pain Location: generalized with movement Pain Descriptors / Indicators: Grimacing, Sore Pain Intervention(s): Limited activity within patient's tolerance, Monitored during session, Repositioned  Home Living                                          Prior Functioning/Environment              Frequency  Min 1X/week        Progress Toward Goals  OT Goals(current goals can now be found in the care plan section)  Progress towards OT goals: Progressing toward goals  Acute Rehab OT Goals Patient Stated Goal: did not state OT Goal Formulation: Patient unable to participate in goal setting Time For Goal Achievement: 02/08/24 Potential to Achieve Goals: Fair  Plan      Co-evaluation                 AM-PAC OT 6 Clicks Daily Activity     Outcome Measure   Help from another person eating meals?: Total Help from another person taking care of personal grooming?: A Little Help from another person toileting, which includes using toliet, bedpan, or urinal?: Total Help from another person bathing (including washing, rinsing, drying)?: Total Help from another person to put on and taking off regular upper body clothing?: A Lot Help from another person to  put on and taking off regular lower body clothing?: Total 6 Click Score: 9    End of Session    OT Visit Diagnosis: Unsteadiness on feet (R26.81);Other abnormalities of gait and mobility (R26.89);Muscle weakness (generalized) (M62.81)   Activity Tolerance Patient tolerated treatment well   Patient Left in bed;with call bell/phone within reach;with bed alarm set;with restraints reapplied   Nurse Communication Mobility status        Time: 8561-8540 OT Time Calculation (min): 21 min  Charges: OT General Charges $OT Visit: 1 Visit OT Treatments $Self Care/Home Management : 8-22 mins  Ronal Gift E. Mussa Groesbeck, OTR/L Acute Rehabilitation Services [phone]   Ronal Gift Salt 01/31/2024, 4:00 PM

## 2024-01-31 NOTE — Progress Notes (Signed)
 PROGRESS NOTE    Amanda [name]  FMW:[id number] DOB: Mar 06, 1961 DOA: 01/03/2024 PCP: Amanda [name]   Brief Narrative:  Patient is a 63 year old female with history of hypertension, cocaine abuse, diabetes type 2 who presented with lethargy, near syncope.  Found to be severely hypertensive, UDS positive for cocaine, with acute intraparenchymal hemorrhage , intraventricular hemorrhage and hydrocephalus.  Patient was admitted to ICU for close blood pressure management.  Neurosurgery consulted.  Status post right frontal ventriculostomy.  EVD eventually removed on 10/3.  Hospital course remarkable for dysphagia.  Speech therapy following.  Dietitian/speech therapy recommending PEG placement because she is not able to tolerate anything by mouth.  PT/OT recommending SNF on discharge.  Underwent PEG placement by IR on 10/16. TOC following    Significant events: 9/22 Admitted and intubated, emergent burr holes and right frontal ventriculostomy placed by neurosurgery due to neurological worsening 9/23 extubated 10/1 EVD clamped 10/3 EVD removed 10/16 PEG placement by IR for worsening dysphagia  Assessment & Plan:   Principal Problem:   ICH (intracerebral hemorrhage) (HCC) Active Problems:   Hypertensive emergency   Tobacco abuse   Cocaine abuse (HCC)   History of alcohol abuse   Chronic combined systolic and diastolic heart failure (HCC)   HTN (hypertension)   Hemorrhagic stroke (HCC)   Malnutrition of moderate degree  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 Continues to receive PT. Plan for discharge to SNF.  Still has skin staples in from 10/3 EVD removal; defer to [facility] about removal timeline.   Hypertension: Stable.  Continue medications: Amlodipine  10 mg daily, carvedilol  5 mg twice daily, Entresto .   Dysphagia:  S/P PEG placement on 10/16.  Now has  progressed to mechanical soft and thin liquid diet; can offer p.o. particularly water  when alert.  Will continue to monitor.   Chronic combined systolic/diastolic CHF: Stable, continue Entresto  and spironolactone .  Not on aspirin  due to recent hemorrhage.    Type 2 diabetes: Recent A1c of 6.6.  Glucose controlled on sliding scale insulin .   Cocaine abuse: Consulted TOC for cocaine cessation resources.  DVT prophylaxis: SCD's Start: 01/11/24 2301 heparin  injection 5,000 Units Start: 01/05/24 1015 Place and maintain sequential compression device Start: 01/03/24 0611 SCDs Start: 01/03/24 0429   Code Status: Full Code  Family Communication:  None present at bedside.   Status is: Inpatient Remains inpatient appropriate because: Medically stable, pending placement.   Estimated body mass index is 19.74 kg/m as calculated from the following:   Height as of this encounter: 5' 5 (1.651 m).   Weight as of this encounter: 53.8 kg.    Nutritional Assessment: Body mass index is 19.74 kg/m.[name] [name].  I agree with the assessment and plan as outlined below: Nutrition Status: Nutrition Problem: Moderate Malnutrition Etiology: social / environmental circumstances (polysubstance abuse) Signs/Symptoms: mild muscle depletion, moderate fat depletion, mild fat depletion, moderate muscle depletion Interventions: Prostat, Tube feeding  . Skin Assessment: I have examined the patient's skin and I agree with the wound assessment as performed by the wound care RN as outlined below:    Consultants:  Neurosurgery and IR  Procedures:  As above  Antimicrobials:  Anti-infectives (From admission, onward)    Start     Dose/Rate Route Frequency Ordered Stop   01/27/24 0807  ceFAZolin (ANCEF) IVPB 2g/100 mL premix        over 30 Minutes Intravenous Continuous PRN 01/27/24 0807 01/27/24  9192   01/13/24 0600  ceFAZolin (ANCEF) IVPB 2g/100 mL premix        2 g 200 mL/hr over 30 Minutes  Intravenous On call to O.R. 01/11/24 2300 01/13/24 0557         Subjective: Patient seen and examined, she was found laying in the bed in the horizontal position.  She had hand mittens in the left hand.  She was alert and oriented to self.  Was not oriented to time, date, month or location.  She had no complaints.  Objective: Vitals:   01/30/24 1123 01/30/24 1159 01/30/24 1544 01/30/24 2000  BP: 133/72 119/77 (!) 120/90 118/87  Pulse: 83 77 79 72  Resp: 18  18 18   Temp:  97.6 F (36.4 C) 97.8 F (36.6 C) 98 F (36.7 C)  TempSrc:  Axillary Oral Oral  SpO2: 93% 94% 94% 93%  Weight:      Height:        Intake/Output Summary (Last 24 hours) at 01/31/2024 0755 Last data filed at 01/31/2024 0400 Gross [name] 24 hour  Intake 380 ml  Output 500 ml  Net -120 ml   Filed Weights   01/26/24 0551 01/28/24 0500 01/29/24 0738  Weight: 54.8 kg 53 kg 53.8 kg    Examination:  General exam: Appears calm and comfortable  Respiratory system: Clear to auscultation. Respiratory effort normal. Cardiovascular system: S1 & S2 heard, RRR. No JVD, murmurs, rubs, gallops or clicks. No pedal edema. Gastrointestinal system: Abdomen is nondistended, soft and nontender. No organomegaly or masses felt. Normal bowel sounds heard. Central nervous system: Alert and oriented x 1.  Left hemiplegia.  Data Reviewed: I have personally reviewed following labs and imaging studies  CBC: Recent Labs  Lab 01/29/24 0633  WBC 7.2  HGB 13.3  HCT 40.1  MCV 86.6  PLT 284   Basic Metabolic Panel: Recent Labs  Lab 01/29/24 0633  NA 135  K 4.0  CL 99  CO2 24  GLUCOSE 173*  BUN 36*  CREATININE 0.86  CALCIUM 9.9   GFR: Estimated Creatinine Clearance: 57.6 mL/min (by C-G formula based on SCr of 0.86 mg/dL). Liver Function Tests: No results for input(s): AST, ALT, ALKPHOS, BILITOT, PROT, ALBUMIN in the last 168 hours. No results for input(s): LIPASE, AMYLASE in the last 168 hours. No  results for input(s): AMMONIA in the last 168 hours. Coagulation Profile: Recent Labs  Lab 01/26/24 1507  INR 1.1   Cardiac Enzymes: No results for input(s): CKTOTAL, CKMB, CKMBINDEX, TROPONINI in the last 168 hours. BNP (last 3 results) No results for input(s): PROBNP in the last 8760 hours. HbA1C: No results for input(s): HGBA1C in the last 72 hours. CBG: Recent Labs  Lab 01/30/24 1200 01/30/24 1600 01/30/24 1940 01/31/24 0651 01/31/24 0751  GLUCAP 102* 157* 155* 126* 144*   Lipid Profile: No results for input(s): CHOL, HDL, LDLCALC, TRIG, CHOLHDL, LDLDIRECT in the last 72 hours. Thyroid Function Tests: No results for input(s): TSH, T4TOTAL, FREET4, T3FREE, THYROIDAB in the last 72 hours. Anemia Panel: No results for input(s): VITAMINB12, FOLATE, FERRITIN, TIBC, IRON, RETICCTPCT in the last 72 hours. Sepsis Labs: No results for input(s): PROCALCITON, LATICACIDVEN in the last 168 hours.  No results found for this or any previous visit (from the past 240 hours).   Radiology Studies: No results found.  Scheduled Meds:  amLODipine   10 mg [name] Tube Daily   carvedilol   25 mg [name] Tube BID WC   feeding supplement (PROSource TF20)  60  mL [name] Tube Daily   free water   60 mL [name] Tube Q4H   heparin  injection (subcutaneous)  5,000 Units Subcutaneous Q8H   insulin  aspart  0-9 Units Subcutaneous Q4H   mouth rinse  15 mL Mouth Rinse 4 times [name] day   QUEtiapine   25 mg [name] Tube QHS   sacubitril -valsartan   1 tablet [name] Tube BID   spironolactone   25 mg [name] Tube Daily   Continuous Infusions:  feeding supplement (OSMOLITE 1.5 CAL) 1,000 mL (01/29/24 0029)     LOS: 28 days   Fredia Skeeter, MD [facility]  01/31/2024, 7:55 AM   *Please note that this is a verbal dictation therefore any spelling or grammatical errors are due to the Dragon Medical One system interpretation.  Please page via Amion and do not message via  secure chat for urgent patient care matters. Secure chat can be used for non urgent patient care matters.  How to contact the [facility] Attending or Consulting provider 7A - 7P or covering provider during after hours 7P -7A, for this patient?  Check the care team in [facility] and look for a) attending/consulting [facility] provider listed and b) the [facility] team listed. Page or secure chat 7A-7P. Log into [email or web address] and use [place]'s universal password to access. If you do not have the password, please contact the hospital operator. Locate the [facility] provider you are looking for under [facility] and page to a number that you can be directly reached. If you still have difficulty reaching the provider, please page the [facility] (Director on Call) for the Hospitalists listed on amion for assistance.

## 2024-01-31 NOTE — Plan of Care (Signed)
 Problem: Education: Goal: Ability to describe self-care measures that may prevent or decrease complications (Diabetes Survival Skills Education) will improve 01/31/2024 1517 by Jenel Bobetta SAILOR, RN Outcome: Progressing 01/31/2024 1516 by Jenel Bobetta SAILOR, RN Outcome: Progressing   Problem: Coping: Goal: Ability to adjust to condition or change in health will improve 01/31/2024 1517 by Jenel Bobetta SAILOR, RN Outcome: Progressing 01/31/2024 1516 by Jenel Bobetta SAILOR, RN Outcome: Progressing   Problem: Fluid Volume: Goal: Ability to maintain a balanced intake and output will improve 01/31/2024 1517 by Jenel Bobetta SAILOR, RN Outcome: Progressing 01/31/2024 1516 by Jenel Bobetta SAILOR, RN Outcome: Progressing   Problem: Health Behavior/Discharge Planning: Goal: Ability to identify and utilize available resources and services will improve 01/31/2024 1517 by Jenel Bobetta SAILOR, RN Outcome: Progressing 01/31/2024 1516 by Jenel Bobetta SAILOR, RN Outcome: Progressing Goal: Ability to manage health-related needs will improve 01/31/2024 1517 by Jenel Bobetta SAILOR, RN Outcome: Progressing 01/31/2024 1516 by Jenel Bobetta SAILOR, RN Outcome: Progressing   Problem: Metabolic: Goal: Ability to maintain appropriate glucose levels will improve 01/31/2024 1517 by Jenel Bobetta SAILOR, RN Outcome: Progressing 01/31/2024 1516 by Jenel Bobetta SAILOR, RN Outcome: Progressing   Problem: Nutritional: Goal: Maintenance of adequate nutrition will improve 01/31/2024 1517 by Jenel Bobetta SAILOR, RN Outcome: Progressing 01/31/2024 1516 by Jenel Bobetta SAILOR, RN Outcome: Progressing Goal: Progress toward achieving an optimal weight will improve 01/31/2024 1517 by Jenel Bobetta SAILOR, RN Outcome: Progressing 01/31/2024 1516 by Jenel Bobetta SAILOR, RN Outcome: Progressing   Problem: Skin Integrity: Goal: Risk for impaired skin integrity will decrease 01/31/2024 1517 by Jenel Bobetta SAILOR, RN Outcome: Progressing 01/31/2024 1516 by Jenel Bobetta SAILOR, RN Outcome: Progressing   Problem: Tissue Perfusion: Goal: Adequacy of tissue perfusion will improve 01/31/2024 1517 by Jenel Bobetta SAILOR, RN Outcome: Progressing 01/31/2024 1516 by Jenel Bobetta SAILOR, RN Outcome: Progressing   Problem: Education: Goal: Knowledge of General Education information will improve Description: Including pain rating scale, medication(s)/side effects and non-pharmacologic comfort measures 01/31/2024 1517 by Jenel Bobetta SAILOR, RN Outcome: Progressing 01/31/2024 1516 by Jenel Bobetta SAILOR, RN Outcome: Progressing   Problem: Health Behavior/Discharge Planning: Goal: Ability to manage health-related needs will improve 01/31/2024 1517 by Jenel Bobetta SAILOR, RN Outcome: Progressing 01/31/2024 1516 by Jenel Bobetta SAILOR, RN Outcome: Progressing   Problem: Clinical Measurements: Goal: Ability to maintain clinical measurements within normal limits will improve 01/31/2024 1517 by Jenel Bobetta SAILOR, RN Outcome: Progressing 01/31/2024 1516 by Jenel Bobetta SAILOR, RN Outcome: Progressing Goal: Will remain free from infection 01/31/2024 1517 by Jenel Bobetta SAILOR, RN Outcome: Progressing 01/31/2024 1516 by Jenel Bobetta SAILOR, RN Outcome: Progressing Goal: Diagnostic test results will improve 01/31/2024 1517 by Jenel Bobetta SAILOR, RN Outcome: Progressing 01/31/2024 1516 by Jenel Bobetta SAILOR, RN Outcome: Progressing Goal: Respiratory complications will improve 01/31/2024 1517 by Jenel Bobetta SAILOR, RN Outcome: Progressing 01/31/2024 1516 by Jenel Bobetta SAILOR, RN Outcome: Progressing Goal: Cardiovascular complication will be avoided 01/31/2024 1517 by Jenel Bobetta SAILOR, RN Outcome: Progressing 01/31/2024 1516 by Jenel Bobetta SAILOR, RN Outcome: Progressing   Problem: Activity: Goal: Risk for activity intolerance will decrease 01/31/2024 1517 by Jenel Bobetta SAILOR, RN Outcome: Progressing 01/31/2024 1516 by Jenel Bobetta SAILOR, RN Outcome: Progressing   Problem: Nutrition: Goal: Adequate  nutrition will be maintained 01/31/2024 1517 by Jenel Bobetta SAILOR, RN Outcome: Progressing 01/31/2024 1516 by Jenel Bobetta SAILOR, RN Outcome: Progressing   Problem: Coping: Goal: Level of anxiety will decrease 01/31/2024 1517 by Jenel Bobetta SAILOR, RN Outcome: Progressing 01/31/2024 1516 by Jenel,  Bobetta SAILOR, RN Outcome: Progressing   Problem: Elimination: Goal: Will not experience complications related to bowel motility 01/31/2024 1517 by Jenel Bobetta SAILOR, RN Outcome: Progressing 01/31/2024 1516 by Jenel Bobetta SAILOR, RN Outcome: Progressing Goal: Will not experience complications related to urinary retention 01/31/2024 1517 by Jenel Bobetta SAILOR, RN Outcome: Progressing 01/31/2024 1516 by Jenel Bobetta SAILOR, RN Outcome: Progressing   Problem: Pain Managment: Goal: General experience of comfort will improve and/or be controlled 01/31/2024 1517 by Jenel Bobetta SAILOR, RN Outcome: Progressing 01/31/2024 1516 by Jenel Bobetta SAILOR, RN Outcome: Progressing   Problem: Safety: Goal: Ability to remain free from injury will improve 01/31/2024 1517 by Jenel Bobetta SAILOR, RN Outcome: Progressing 01/31/2024 1516 by Jenel Bobetta SAILOR, RN Outcome: Progressing   Problem: Skin Integrity: Goal: Risk for impaired skin integrity will decrease 01/31/2024 1517 by Jenel Bobetta SAILOR, RN Outcome: Progressing 01/31/2024 1516 by Jenel Bobetta SAILOR, RN Outcome: Progressing   Problem: Education: Goal: Knowledge of disease or condition will improve 01/31/2024 1517 by Jenel Bobetta SAILOR, RN Outcome: Progressing 01/31/2024 1516 by Jenel Bobetta SAILOR, RN Outcome: Progressing Goal: Knowledge of secondary prevention will improve (MUST DOCUMENT ALL) 01/31/2024 1517 by Jenel Bobetta SAILOR, RN Outcome: Progressing 01/31/2024 1516 by Jenel Bobetta SAILOR, RN Outcome: Progressing Goal: Knowledge of patient specific risk factors will improve (DELETE if not current risk factor) 01/31/2024 1517 by Jenel Bobetta SAILOR, RN Outcome:  Progressing 01/31/2024 1516 by Jenel Bobetta SAILOR, RN Outcome: Progressing   Problem: Intracerebral Hemorrhage Tissue Perfusion: Goal: Complications of Intracerebral Hemorrhage will be minimized 01/31/2024 1517 by Jenel Bobetta SAILOR, RN Outcome: Progressing 01/31/2024 1516 by Jenel Bobetta SAILOR, RN Outcome: Progressing   Problem: Coping: Goal: Will verbalize positive feelings about self 01/31/2024 1517 by Jenel Bobetta SAILOR, RN Outcome: Progressing 01/31/2024 1516 by Jenel Bobetta SAILOR, RN Outcome: Progressing Goal: Will identify appropriate support needs 01/31/2024 1517 by Jenel Bobetta SAILOR, RN Outcome: Progressing 01/31/2024 1516 by Jenel Bobetta SAILOR, RN Outcome: Progressing   Problem: Health Behavior/Discharge Planning: Goal: Ability to manage health-related needs will improve 01/31/2024 1517 by Jenel Bobetta SAILOR, RN Outcome: Progressing 01/31/2024 1516 by Jenel Bobetta SAILOR, RN Outcome: Progressing Goal: Goals will be collaboratively established with patient/family 01/31/2024 1517 by Jenel Bobetta SAILOR, RN Outcome: Progressing 01/31/2024 1516 by Jenel Bobetta SAILOR, RN Outcome: Progressing   Problem: Self-Care: Goal: Ability to participate in self-care as condition permits will improve 01/31/2024 1517 by Jenel Bobetta SAILOR, RN Outcome: Progressing 01/31/2024 1516 by Jenel Bobetta SAILOR, RN Outcome: Progressing Goal: Verbalization of feelings and concerns over difficulty with self-care will improve 01/31/2024 1517 by Jenel Bobetta SAILOR, RN Outcome: Progressing 01/31/2024 1516 by Jenel Bobetta SAILOR, RN Outcome: Progressing Goal: Ability to communicate needs accurately will improve 01/31/2024 1517 by Jenel Bobetta SAILOR, RN Outcome: Progressing 01/31/2024 1516 by Jenel Bobetta SAILOR, RN Outcome: Progressing   Problem: Nutrition: Goal: Risk of aspiration will decrease 01/31/2024 1517 by Jenel Bobetta SAILOR, RN Outcome: Progressing 01/31/2024 1516 by Jenel Bobetta SAILOR, RN Outcome: Progressing Goal: Dietary intake  will improve 01/31/2024 1517 by Jenel Bobetta SAILOR, RN Outcome: Progressing 01/31/2024 1516 by Jenel Bobetta SAILOR, RN Outcome: Progressing

## 2024-01-31 NOTE — Plan of Care (Signed)
  Problem: Education: Goal: Ability to describe self-care measures that may prevent or decrease complications (Diabetes Survival Skills Education) will improve Outcome: Progressing   Problem: Coping: Goal: Ability to adjust to condition or change in health will improve Outcome: Progressing   Problem: Fluid Volume: Goal: Ability to maintain a balanced intake and output will improve Outcome: Progressing   Problem: Health Behavior/Discharge Planning: Goal: Ability to identify and utilize available resources and services will improve Outcome: Progressing Goal: Ability to manage health-related needs will improve Outcome: Progressing   Problem: Metabolic: Goal: Ability to maintain appropriate glucose levels will improve Outcome: Progressing   Problem: Nutritional: Goal: Maintenance of adequate nutrition will improve Outcome: Progressing Goal: Progress toward achieving an optimal weight will improve Outcome: Progressing   Problem: Skin Integrity: Goal: Risk for impaired skin integrity will decrease Outcome: Progressing   Problem: Tissue Perfusion: Goal: Adequacy of tissue perfusion will improve Outcome: Progressing   Problem: Education: Goal: Knowledge of General Education information will improve Description: Including pain rating scale, medication(s)/side effects and non-pharmacologic comfort measures Outcome: Progressing   Problem: Health Behavior/Discharge Planning: Goal: Ability to manage health-related needs will improve Outcome: Progressing   Problem: Clinical Measurements: Goal: Ability to maintain clinical measurements within normal limits will improve Outcome: Progressing Goal: Will remain free from infection Outcome: Progressing Goal: Diagnostic test results will improve Outcome: Progressing Goal: Respiratory complications will improve Outcome: Progressing Goal: Cardiovascular complication will be avoided Outcome: Progressing   Problem: Activity: Goal:  Risk for activity intolerance will decrease Outcome: Progressing   Problem: Nutrition: Goal: Adequate nutrition will be maintained Outcome: Progressing   Problem: Coping: Goal: Level of anxiety will decrease Outcome: Progressing   Problem: Elimination: Goal: Will not experience complications related to bowel motility Outcome: Progressing Goal: Will not experience complications related to urinary retention Outcome: Progressing   Problem: Pain Managment: Goal: General experience of comfort will improve and/or be controlled Outcome: Progressing   Problem: Safety: Goal: Ability to remain free from injury will improve Outcome: Progressing   Problem: Skin Integrity: Goal: Risk for impaired skin integrity will decrease Outcome: Progressing   Problem: Education: Goal: Knowledge of disease or condition will improve Outcome: Progressing Goal: Knowledge of secondary prevention will improve (MUST DOCUMENT ALL) Outcome: Progressing Goal: Knowledge of patient specific risk factors will improve (DELETE if not current risk factor) Outcome: Progressing   Problem: Intracerebral Hemorrhage Tissue Perfusion: Goal: Complications of Intracerebral Hemorrhage will be minimized Outcome: Progressing   Problem: Coping: Goal: Will verbalize positive feelings about self Outcome: Progressing Goal: Will identify appropriate support needs Outcome: Progressing   Problem: Health Behavior/Discharge Planning: Goal: Ability to manage health-related needs will improve Outcome: Progressing Goal: Goals will be collaboratively established with patient/family Outcome: Progressing   Problem: Self-Care: Goal: Ability to participate in self-care as condition permits will improve Outcome: Progressing Goal: Verbalization of feelings and concerns over difficulty with self-care will improve Outcome: Progressing Goal: Ability to communicate needs accurately will improve Outcome: Progressing   Problem:  Nutrition: Goal: Risk of aspiration will decrease Outcome: Progressing Goal: Dietary intake will improve Outcome: Progressing

## 2024-02-01 DIAGNOSIS — I610 Nontraumatic intracerebral hemorrhage in hemisphere, subcortical: Secondary | ICD-10-CM | POA: Diagnosis not present

## 2024-02-01 LAB — GLUCOSE, CAPILLARY
Glucose-Capillary: 118 mg/dL — ABNORMAL HIGH (ref 70–99)
Glucose-Capillary: 119 mg/dL — ABNORMAL HIGH (ref 70–99)
Glucose-Capillary: 132 mg/dL — ABNORMAL HIGH (ref 70–99)
Glucose-Capillary: 137 mg/dL — ABNORMAL HIGH (ref 70–99)
Glucose-Capillary: 149 mg/dL — ABNORMAL HIGH (ref 70–99)
Glucose-Capillary: 174 mg/dL — ABNORMAL HIGH (ref 70–99)

## 2024-02-01 NOTE — Progress Notes (Signed)
 PROGRESS NOTE    Amanda [name]  FMW:[id number] DOB: [birth year] DOA: 01/03/2024 PCP: Patient, No Pcp Per   Brief Narrative:  Patient is a 63 year old female with history of hypertension, cocaine abuse, diabetes type 2 who presented with lethargy, near syncope.  Found to be severely hypertensive, UDS positive for cocaine, with acute intraparenchymal hemorrhage , intraventricular hemorrhage and hydrocephalus.  Patient was admitted to ICU for close blood pressure management.  Neurosurgery consulted.  Status post right frontal ventriculostomy.  EVD eventually removed on 10/3.  Hospital course remarkable for dysphagia.  Speech therapy following.  Dietitian/speech therapy recommending PEG placement because she is not able to tolerate anything by mouth.  PT/OT recommending SNF on discharge.  Underwent PEG placement by IR on 10/16. TOC following    Significant events: 9/22 Admitted and intubated, emergent burr holes and right frontal ventriculostomy placed by neurosurgery due to neurological worsening 9/23 extubated 10/1 EVD clamped 10/3 EVD removed 10/16 PEG placement by IR for worsening dysphagia  Assessment & Plan:   Principal Problem:   ICH (intracerebral hemorrhage) (HCC) Active Problems:   Hypertensive emergency   Tobacco abuse   Cocaine abuse (HCC)   History of alcohol abuse   Chronic combined systolic and diastolic heart failure (HCC)   HTN (hypertension)   Hemorrhagic stroke (HCC)   Malnutrition of moderate degree  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 Continues to receive PT. Plan for discharge to SNF.  Still has skin staples in from 10/3 EVD removal; defer to [place] neurosurgery about removal timeline.  I have sent a secure chat message to neurosurgery NP Amanda [name].   Hypertension: Stable.  Continue medications: Amlodipine  10 mg daily, carvedilol  5 mg twice  daily, Entresto .   Dysphagia:  S/P PEG placement on 10/16.  Now has progressed to mechanical soft and thin liquid diet; can offer p.o. particularly water  when alert.  Will continue to monitor.   Chronic combined systolic/diastolic CHF: Stable, continue Entresto  and spironolactone .  Not on aspirin  due to recent hemorrhage.    Type 2 diabetes: Recent A1c of 6.6.  Glucose controlled on sliding scale insulin .   Cocaine abuse: Consulted TOC for cocaine cessation resources.  DVT prophylaxis: SCD's Start: 01/11/24 2301 heparin  injection 5,000 Units Start: 01/05/24 1015 Place and maintain sequential compression device Start: 01/03/24 0611 SCDs Start: 01/03/24 0429   Code Status: Full Code  Family Communication:  None present at bedside.   Status is: Inpatient Remains inpatient appropriate because: Medically stable, pending placement.   Estimated body mass index is 19.74 kg/m as calculated from the following:   Height as of this encounter: 5' 5 (1.651 m).   Weight as of this encounter: 53.8 kg.    Nutritional Assessment: Body mass index is 19.74 kg/m.Amanda [name] [name].  I agree with the assessment and plan as outlined below: Nutrition Status: Nutrition Problem: Moderate Malnutrition Etiology: social / environmental circumstances (polysubstance abuse) Signs/Symptoms: mild muscle depletion, moderate fat depletion, mild fat depletion, moderate muscle depletion Interventions: Prostat, Tube feeding  . Skin Assessment: I have examined the patient's skin and I agree with the wound assessment as performed by the wound care RN as outlined below:    Consultants:  Neurosurgery and IR  Procedures:  As above  Antimicrobials:  Anti-infectives (From admission, onward)    Start     Dose/Rate Route Frequency Ordered Stop   01/27/24 0807  ceFAZolin (ANCEF) IVPB 2g/100 mL premix  over 30 Minutes Intravenous Continuous PRN 01/27/24 0807 01/27/24 0807   01/13/24 0600  ceFAZolin  (ANCEF) IVPB 2g/100 mL premix        2 g 200 mL/hr over 30 Minutes Intravenous On call to O.R. 01/11/24 2300 01/13/24 0557         Subjective: Patient seen and examined, she has no complaints.  Appears in good mood today.  Objective: Vitals:   01/31/24 2015 02/01/24 0000 02/01/24 0410 02/01/24 0730  BP: 120/77 116/75 110/74 90/62  Pulse: 68 71  69  Resp: 20 20 18 16   Temp: 98 F (36.7 C) 98 F (36.7 C)  98 F (36.7 C)  TempSrc: Oral Oral  Axillary  SpO2: 96% 95% 95% 96%  Weight:      Height:        Intake/Output Summary (Last 24 hours) at 02/01/2024 1026 Last data filed at 02/01/2024 0900 Gross per 24 hour  Intake 360 ml  Output 1000 ml  Net -640 ml   Filed Weights   01/26/24 0551 01/28/24 0500 01/29/24 0738  Weight: 54.8 kg 53 kg 53.8 kg    Examination:  General exam: Appears calm and comfortable  Respiratory system: Clear to auscultation. Respiratory effort normal. Cardiovascular system: S1 & S2 heard, RRR. No JVD, murmurs, rubs, gallops or clicks. No pedal edema. Gastrointestinal system: Abdomen is nondistended, soft and nontender. No organomegaly or masses felt. Normal bowel sounds heard. Central nervous system: Alert and oriented x 1.  Left hemiparesis.  Data Reviewed: I have personally reviewed following labs and imaging studies  CBC: Recent Labs  Lab 01/29/24 0633  WBC 7.2  HGB 13.3  HCT 40.1  MCV 86.6  PLT 284   Basic Metabolic Panel: Recent Labs  Lab 01/29/24 0633  NA 135  K 4.0  CL 99  CO2 24  GLUCOSE 173*  BUN 36*  CREATININE 0.86  CALCIUM 9.9   GFR: Estimated Creatinine Clearance: 57.6 mL/min (by C-G formula based on SCr of 0.86 mg/dL). Liver Function Tests: No results for input(s): AST, ALT, ALKPHOS, BILITOT, PROT, ALBUMIN in the last 168 hours. No results for input(s): LIPASE, AMYLASE in the last 168 hours. No results for input(s): AMMONIA in the last 168 hours. Coagulation Profile: Recent Labs  Lab  01/26/24 1507  INR 1.1   Cardiac Enzymes: No results for input(s): CKTOTAL, CKMB, CKMBINDEX, TROPONINI in the last 168 hours. BNP (last 3 results) No results for input(s): PROBNP in the last 8760 hours. HbA1C: No results for input(s): HGBA1C in the last 72 hours. CBG: Recent Labs  Lab 01/31/24 1535 01/31/24 2006 02/01/24 0033 02/01/24 0330 02/01/24 0730  GLUCAP 155* 120* 119* 149* 174*   Lipid Profile: No results for input(s): CHOL, HDL, LDLCALC, TRIG, CHOLHDL, LDLDIRECT in the last 72 hours. Thyroid Function Tests: No results for input(s): TSH, T4TOTAL, FREET4, T3FREE, THYROIDAB in the last 72 hours. Anemia Panel: No results for input(s): VITAMINB12, FOLATE, FERRITIN, TIBC, IRON, RETICCTPCT in the last 72 hours. Sepsis Labs: No results for input(s): PROCALCITON, LATICACIDVEN in the last 168 hours.  No results found for this or any previous visit (from the past 240 hours).   Radiology Studies: No results found.  Scheduled Meds:  amLODipine   10 mg Per Tube Daily   carvedilol   25 mg Per Tube BID WC   feeding supplement (PROSource TF20)  60 mL Per Tube Daily   free water   60 mL Per Tube Q4H   heparin  injection (subcutaneous)  5,000 Units Subcutaneous Q8H  insulin  aspart  0-9 Units Subcutaneous Q4H   mouth rinse  15 mL Mouth Rinse 4 times per day   QUEtiapine   25 mg Per Tube QHS   sacubitril -valsartan   1 tablet Per Tube BID   spironolactone   25 mg Per Tube Daily   Continuous Infusions:  feeding supplement (OSMOLITE 1.5 CAL) 1,000 mL (01/29/24 0029)     LOS: 29 days   Fredia Skeeter, MD [facility]  02/01/2024, 10:26 AM   *Please note that this is a verbal dictation therefore any spelling or grammatical errors are due to the Dragon Medical One system interpretation.  Please page via Amion and do not message via secure chat for urgent patient care matters. Secure chat can be used for non urgent patient care  matters.  How to contact the [facility] Attending or Consulting provider 7A - 7P or covering provider during after hours 7P -7A, for this patient?  Check the care team in [facility] and look for a) attending/consulting [facility] provider listed and b) the [facility] team listed. Page or secure chat 7A-7P. Log into [email or web address] and use [facility]'s universal password to access. If you do not have the password, please contact the hospital operator. Locate the [facility] provider you are looking for under [facility] and page to a number that you can be directly reached. If you still have difficulty reaching the provider, please page the [facility] (Director on Call) for the Hospitalists listed on amion for assistance.

## 2024-02-01 NOTE — Plan of Care (Signed)
 Problem: Education: Goal: Ability to describe self-care measures that may prevent or decrease complications (Diabetes Survival Skills Education) will improve 02/01/2024 1625 by Jenel Bobetta SAILOR, RN Outcome: Progressing 02/01/2024 0906 by Jenel Bobetta SAILOR, RN Outcome: Progressing   Problem: Coping: Goal: Ability to adjust to condition or change in health will improve 02/01/2024 1625 by Jenel Bobetta SAILOR, RN Outcome: Progressing 02/01/2024 0906 by Jenel Bobetta SAILOR, RN Outcome: Progressing   Problem: Fluid Volume: Goal: Ability to maintain a balanced intake and output will improve 02/01/2024 1625 by Jenel Bobetta SAILOR, RN Outcome: Progressing 02/01/2024 0906 by Jenel Bobetta SAILOR, RN Outcome: Progressing   Problem: Health Behavior/Discharge Planning: Goal: Ability to identify and utilize available resources and services will improve 02/01/2024 1625 by Jenel Bobetta SAILOR, RN Outcome: Progressing 02/01/2024 0906 by Jenel Bobetta SAILOR, RN Outcome: Progressing Goal: Ability to manage health-related needs will improve 02/01/2024 1625 by Jenel Bobetta SAILOR, RN Outcome: Progressing 02/01/2024 0906 by Jenel Bobetta SAILOR, RN Outcome: Progressing   Problem: Metabolic: Goal: Ability to maintain appropriate glucose levels will improve 02/01/2024 1625 by Jenel Bobetta SAILOR, RN Outcome: Progressing 02/01/2024 0906 by Jenel Bobetta SAILOR, RN Outcome: Progressing   Problem: Nutritional: Goal: Maintenance of adequate nutrition will improve 02/01/2024 1625 by Jenel Bobetta SAILOR, RN Outcome: Progressing 02/01/2024 0906 by Jenel Bobetta SAILOR, RN Outcome: Progressing Goal: Progress toward achieving an optimal weight will improve 02/01/2024 1625 by Jenel Bobetta SAILOR, RN Outcome: Progressing 02/01/2024 0906 by Jenel Bobetta SAILOR, RN Outcome: Progressing   Problem: Skin Integrity: Goal: Risk for impaired skin integrity will decrease 02/01/2024 1625 by Jenel Bobetta SAILOR, RN Outcome: Progressing 02/01/2024 0906 by Jenel Bobetta SAILOR, RN Outcome: Progressing   Problem: Tissue Perfusion: Goal: Adequacy of tissue perfusion will improve 02/01/2024 1625 by Jenel Bobetta SAILOR, RN Outcome: Progressing 02/01/2024 0906 by Jenel Bobetta SAILOR, RN Outcome: Progressing   Problem: Education: Goal: Knowledge of General Education information will improve Description: Including pain rating scale, medication(s)/side effects and non-pharmacologic comfort measures 02/01/2024 1625 by Jenel Bobetta SAILOR, RN Outcome: Progressing 02/01/2024 0906 by Jenel Bobetta SAILOR, RN Outcome: Progressing   Problem: Health Behavior/Discharge Planning: Goal: Ability to manage health-related needs will improve 02/01/2024 1625 by Jenel Bobetta SAILOR, RN Outcome: Progressing 02/01/2024 0906 by Jenel Bobetta SAILOR, RN Outcome: Progressing   Problem: Clinical Measurements: Goal: Ability to maintain clinical measurements within normal limits will improve 02/01/2024 1625 by Jenel Bobetta SAILOR, RN Outcome: Progressing 02/01/2024 0906 by Jenel Bobetta SAILOR, RN Outcome: Progressing Goal: Will remain free from infection 02/01/2024 1625 by Jenel Bobetta SAILOR, RN Outcome: Progressing 02/01/2024 0906 by Jenel Bobetta SAILOR, RN Outcome: Progressing Goal: Diagnostic test results will improve 02/01/2024 1625 by Jenel Bobetta SAILOR, RN Outcome: Progressing 02/01/2024 0906 by Jenel Bobetta SAILOR, RN Outcome: Progressing Goal: Respiratory complications will improve 02/01/2024 1625 by Jenel Bobetta SAILOR, RN Outcome: Progressing 02/01/2024 0906 by Jenel Bobetta SAILOR, RN Outcome: Progressing Goal: Cardiovascular complication will be avoided 02/01/2024 1625 by Jenel Bobetta SAILOR, RN Outcome: Progressing 02/01/2024 0906 by Jenel Bobetta SAILOR, RN Outcome: Progressing   Problem: Activity: Goal: Risk for activity intolerance will decrease 02/01/2024 1625 by Jenel Bobetta SAILOR, RN Outcome: Progressing 02/01/2024 0906 by Jenel Bobetta SAILOR, RN Outcome: Progressing   Problem: Nutrition: Goal: Adequate  nutrition will be maintained 02/01/2024 1625 by Jenel Bobetta SAILOR, RN Outcome: Progressing 02/01/2024 0906 by Jenel Bobetta SAILOR, RN Outcome: Progressing   Problem: Coping: Goal: Level of anxiety will decrease 02/01/2024 1625 by Jenel Bobetta SAILOR, RN Outcome: Progressing 02/01/2024 0906 by Jenel,  Bobetta SAILOR, RN Outcome: Progressing   Problem: Elimination: Goal: Will not experience complications related to bowel motility 02/01/2024 1625 by Jenel Bobetta SAILOR, RN Outcome: Progressing 02/01/2024 0906 by Jenel Bobetta SAILOR, RN Outcome: Progressing Goal: Will not experience complications related to urinary retention 02/01/2024 1625 by Jenel Bobetta SAILOR, RN Outcome: Progressing 02/01/2024 0906 by Jenel Bobetta SAILOR, RN Outcome: Progressing   Problem: Pain Managment: Goal: General experience of comfort will improve and/or be controlled 02/01/2024 1625 by Jenel Bobetta SAILOR, RN Outcome: Progressing 02/01/2024 0906 by Jenel Bobetta SAILOR, RN Outcome: Progressing   Problem: Safety: Goal: Ability to remain free from injury will improve 02/01/2024 1625 by Jenel Bobetta SAILOR, RN Outcome: Progressing 02/01/2024 0906 by Jenel Bobetta SAILOR, RN Outcome: Progressing   Problem: Skin Integrity: Goal: Risk for impaired skin integrity will decrease 02/01/2024 1625 by Jenel Bobetta SAILOR, RN Outcome: Progressing 02/01/2024 0906 by Jenel Bobetta SAILOR, RN Outcome: Progressing   Problem: Education: Goal: Knowledge of disease or condition will improve 02/01/2024 1625 by Jenel Bobetta SAILOR, RN Outcome: Progressing 02/01/2024 0906 by Jenel Bobetta SAILOR, RN Outcome: Progressing Goal: Knowledge of secondary prevention will improve (MUST DOCUMENT ALL) 02/01/2024 1625 by Jenel Bobetta SAILOR, RN Outcome: Progressing 02/01/2024 0906 by Jenel Bobetta SAILOR, RN Outcome: Progressing Goal: Knowledge of patient specific risk factors will improve (DELETE if not current risk factor) 02/01/2024 1625 by Jenel Bobetta SAILOR, RN Outcome:  Progressing 02/01/2024 0906 by Jenel Bobetta SAILOR, RN Outcome: Progressing   Problem: Intracerebral Hemorrhage Tissue Perfusion: Goal: Complications of Intracerebral Hemorrhage will be minimized 02/01/2024 1625 by Jenel Bobetta SAILOR, RN Outcome: Progressing 02/01/2024 0906 by Jenel Bobetta SAILOR, RN Outcome: Progressing   Problem: Coping: Goal: Will verbalize positive feelings about self 02/01/2024 1625 by Jenel Bobetta SAILOR, RN Outcome: Progressing 02/01/2024 0906 by Jenel Bobetta SAILOR, RN Outcome: Progressing Goal: Will identify appropriate support needs 02/01/2024 1625 by Jenel Bobetta SAILOR, RN Outcome: Progressing 02/01/2024 0906 by Jenel Bobetta SAILOR, RN Outcome: Progressing   Problem: Health Behavior/Discharge Planning: Goal: Ability to manage health-related needs will improve 02/01/2024 1625 by Jenel Bobetta SAILOR, RN Outcome: Progressing 02/01/2024 0906 by Jenel Bobetta SAILOR, RN Outcome: Progressing Goal: Goals will be collaboratively established with patient/family 02/01/2024 1625 by Jenel Bobetta SAILOR, RN Outcome: Progressing 02/01/2024 0906 by Jenel Bobetta SAILOR, RN Outcome: Progressing   Problem: Self-Care: Goal: Ability to participate in self-care as condition permits will improve 02/01/2024 1625 by Jenel Bobetta SAILOR, RN Outcome: Progressing 02/01/2024 0906 by Jenel Bobetta SAILOR, RN Outcome: Progressing Goal: Verbalization of feelings and concerns over difficulty with self-care will improve 02/01/2024 1625 by Jenel Bobetta SAILOR, RN Outcome: Progressing 02/01/2024 0906 by Jenel Bobetta SAILOR, RN Outcome: Progressing Goal: Ability to communicate needs accurately will improve 02/01/2024 1625 by Jenel Bobetta SAILOR, RN Outcome: Progressing 02/01/2024 0906 by Jenel Bobetta SAILOR, RN Outcome: Progressing   Problem: Nutrition: Goal: Risk of aspiration will decrease 02/01/2024 1625 by Jenel Bobetta SAILOR, RN Outcome: Progressing 02/01/2024 0906 by Jenel Bobetta SAILOR, RN Outcome: Progressing Goal: Dietary intake  will improve 02/01/2024 1625 by Jenel Bobetta SAILOR, RN Outcome: Progressing 02/01/2024 0906 by Jenel Bobetta SAILOR, RN Outcome: Progressing

## 2024-02-01 NOTE — Plan of Care (Signed)
  Problem: Education: Goal: Ability to describe self-care measures that may prevent or decrease complications (Diabetes Survival Skills Education) will improve Outcome: Progressing   Problem: Coping: Goal: Ability to adjust to condition or change in health will improve Outcome: Progressing   Problem: Fluid Volume: Goal: Ability to maintain a balanced intake and output will improve Outcome: Progressing   Problem: Health Behavior/Discharge Planning: Goal: Ability to identify and utilize available resources and services will improve Outcome: Progressing Goal: Ability to manage health-related needs will improve Outcome: Progressing   Problem: Metabolic: Goal: Ability to maintain appropriate glucose levels will improve Outcome: Progressing   Problem: Nutritional: Goal: Maintenance of adequate nutrition will improve Outcome: Progressing Goal: Progress toward achieving an optimal weight will improve Outcome: Progressing   Problem: Skin Integrity: Goal: Risk for impaired skin integrity will decrease Outcome: Progressing   Problem: Tissue Perfusion: Goal: Adequacy of tissue perfusion will improve Outcome: Progressing   Problem: Education: Goal: Knowledge of General Education information will improve Description: Including pain rating scale, medication(s)/side effects and non-pharmacologic comfort measures Outcome: Progressing   Problem: Health Behavior/Discharge Planning: Goal: Ability to manage health-related needs will improve Outcome: Progressing   Problem: Clinical Measurements: Goal: Ability to maintain clinical measurements within normal limits will improve Outcome: Progressing Goal: Will remain free from infection Outcome: Progressing Goal: Diagnostic test results will improve Outcome: Progressing Goal: Respiratory complications will improve Outcome: Progressing Goal: Cardiovascular complication will be avoided Outcome: Progressing   Problem: Activity: Goal:  Risk for activity intolerance will decrease Outcome: Progressing   Problem: Nutrition: Goal: Adequate nutrition will be maintained Outcome: Progressing   Problem: Coping: Goal: Level of anxiety will decrease Outcome: Progressing   Problem: Elimination: Goal: Will not experience complications related to bowel motility Outcome: Progressing Goal: Will not experience complications related to urinary retention Outcome: Progressing   Problem: Pain Managment: Goal: General experience of comfort will improve and/or be controlled Outcome: Progressing   Problem: Safety: Goal: Ability to remain free from injury will improve Outcome: Progressing   Problem: Skin Integrity: Goal: Risk for impaired skin integrity will decrease Outcome: Progressing   Problem: Education: Goal: Knowledge of disease or condition will improve Outcome: Progressing Goal: Knowledge of secondary prevention will improve (MUST DOCUMENT ALL) Outcome: Progressing Goal: Knowledge of patient specific risk factors will improve (DELETE if not current risk factor) Outcome: Progressing   Problem: Intracerebral Hemorrhage Tissue Perfusion: Goal: Complications of Intracerebral Hemorrhage will be minimized Outcome: Progressing   Problem: Coping: Goal: Will verbalize positive feelings about self Outcome: Progressing Goal: Will identify appropriate support needs Outcome: Progressing   Problem: Health Behavior/Discharge Planning: Goal: Ability to manage health-related needs will improve Outcome: Progressing Goal: Goals will be collaboratively established with patient/family Outcome: Progressing   Problem: Self-Care: Goal: Ability to participate in self-care as condition permits will improve Outcome: Progressing Goal: Verbalization of feelings and concerns over difficulty with self-care will improve Outcome: Progressing Goal: Ability to communicate needs accurately will improve Outcome: Progressing   Problem:  Nutrition: Goal: Risk of aspiration will decrease Outcome: Progressing Goal: Dietary intake will improve Outcome: Progressing

## 2024-02-01 NOTE — Progress Notes (Signed)
 Speech Language Pathology Treatment: Dysphagia;Cognitive-Linguistic  Patient Details Name: Amanda [name] MRN: [id number] DOB: 27-Jul-1960 Today's Date: 02/01/2024 Time: 0950-1015 SLP Time Calculation (min) (ACUTE ONLY): 25 min  Assessment / Plan / Recommendation Clinical Impression  Pt demonstrates improvement in interest in PO, initiation of communication and functional tasks. Very distractible and needs quiet environment and few visual distractions to stay on task with a meal. Initiates but to does not complete bringing napkin to mouth, making requests or comments, using fork to self feed. Discussed improvements with daughter. Will continue efforts.   HPI HPI: [name] with polysubstance/cocaine abuse 3 hrs before presentation, who called EMS for AMS, lethargy and near syncope. Her condition deteriorated (unable to protect her airway and more lethargic) and got intubated. 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 HTN, CHF, DM-2.      SLP Plan  Continue with current plan of care          Recommendations  Diet recommendations: Dysphagia 3 (mechanical soft);Thin liquid Liquids provided via: Cup;Straw Medication Administration: Via alternative means Supervision: Full supervision/cueing for compensatory strategies Compensations: Slow rate;Small sips/bites;Follow solids with liquid Postural Changes and/or Swallow Maneuvers: Seated upright 90 degrees                              Continue with current plan of care     Amanda [name], Amanda [name]  02/01/2024, 11:22 AM

## 2024-02-01 NOTE — Progress Notes (Signed)
 Physical Therapy Progress Note Patient Details Name: Amanda [name] MRN: [id number] DOB: 05-Nov-1960 Today's Date: 02/01/2024   History of Present Illness 63 yo female presents to [facility] on 9/22 with lethargy and near-syncope s/p cocaine and opioid ingestion. 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 S/p R frontal ventriculostomy, burr holes 9/22. ETT 9/22-9/23. PMHx of cocaine abuse, CHF, CKD, chronic cough, HTN and DM2.    PT Comments  Goals assessed and updated as appropriate this session. Pt is progressing slowly towards goals. Pt has met some of her goals including bed mobility. Currently pt is Max A to Mod A for bed mobility and Max A for sit to stand. Pt with slow progress progressing to gait or transfers to begin initiating w/c mobility. Due to pt current functional status, home set up and available assistance at home recommending skilled physical therapy services < 3 hours/day in order to address strength, balance and functional mobility to decrease risk for falls, injury, immobility, skin break down and re-hospitalization.      If plan is discharge home, recommend the following: Two people to help with walking and/or transfers;Supervision due to cognitive status;Assist for transportation;Help with stairs or ramp for entrance;Assistance with cooking/housework     Equipment Recommendations  Hospital bed;Wheelchair (measurements PT);Wheelchair cushion (measurements PT);Hoyer lift;BSC/3in1       Precautions / Restrictions Precautions Precautions: Fall;Other (comment) Recall of Precautions/Restrictions: Impaired Precaution/Restrictions Comments: PEG, SBP < 160, bil mitt, pushes to L Restrictions Weight Bearing Restrictions Per Provider Order: No     Mobility  Bed Mobility Overal bed mobility: Needs Assistance Bed Mobility: Supine to Sit, Sit to Supine     Supine to sit: Max assist Sit to  supine: Mod assist   General bed mobility comments: Pt requires multi modal cues. Tactile cues for initiation; initially pt started to move LE toward EOB but stopped movement halfway through and required Max A to finish getting to EOB, Pt after heavy cueing was able to lay on the pillow and required Mod a to get LE to EOB    Transfers Overall transfer level: Needs assistance Equipment used: Rolling walker (2 wheels) Transfers: Sit to/from Stand Sit to Stand: Max assist           General transfer comment: sit to stand from EOB 3x with Max A and increased time. Pt with difficulty following directions for safe and placement, L foot blocked due to sliding but increased grip on the L hand at RW. Pt with difficulty maintaining attention to keep R hand on RW due to trying to scrath PEG tube.    Ambulation/Gait     General Gait Details: unable to progress   Modified Rankin (Stroke Patients Only) Modified Rankin (Stroke Patients Only) Pre-Morbid Rankin Score: No symptoms Modified Rankin: Severe disability     Balance Overall balance assessment: Needs assistance Sitting-balance support: Feet supported, Single extremity supported, Bilateral upper extremity supported Sitting balance-Leahy Scale: Poor Sitting balance - Comments: Max A initially progressing to Min A sitting EOB due to L lean and posterior lean. Heavy multi modal cues for mid line with ~ 50% correction with cues from patient. Postural control: Posterior lean, Left lateral lean Standing balance support: Bilateral upper extremity supported, Reliant on assistive device for balance Standing balance-Leahy Scale: Poor Standing balance comment: reliant on UE and external support      Communication Communication Communication: Impaired Factors Affecting Communication: Reduced clarity of speech;Difficulty expressing self  Cognition Arousal: Alert  Behavior During Therapy: Restless   PT - Cognitive impairments: Awareness,  Attention, Safety/Judgement, Sequencing, Initiation, Problem solving   Orientation impairments: Place, Time, Situation           PT - Cognition Comments: Cues for awareness and L inattention Following commands: Impaired Following commands impaired: Follows one step commands inconsistently, Follows one step commands with increased time    Cueing Cueing Techniques: Verbal cues, Tactile cues, Visual cues, Gestural cues         Pertinent Vitals/Pain Pain Assessment Pain Assessment: Faces Faces Pain Scale: Hurts a little bit Pain Location: PEG site trying to scratch Pain Descriptors / Indicators: Discomfort Pain Intervention(s): Limited activity within patient's tolerance, Monitored during session, Repositioned     PT Goals (current goals can now be found in the care plan section) Acute Rehab PT Goals Patient Stated Goal: did not state PT Goal Formulation: Patient unable to participate in goal setting Time For Goal Achievement: 02/15/24 Potential to Achieve Goals: Fair Progress towards PT goals: Progressing toward goals;Goals updated    Frequency    Min 1X/week      PT Plan  continue with current POC       AM-PAC PT 6 Clicks Mobility   Outcome Measure  Help needed turning from your back to your side while in a flat bed without using bedrails?: A Lot Help needed moving from lying on your back to sitting on the side of a flat bed without using bedrails?: A Lot Help needed moving to and from a bed to a chair (including a wheelchair)?: Total Help needed standing up from a chair using your arms (e.g., wheelchair or bedside chair)?: A Lot Help needed to walk in hospital room?: Total Help needed climbing 3-5 steps with a railing? : Total 6 Click Score: 9    End of Session Equipment Utilized During Treatment: Gait belt Activity Tolerance: Patient tolerated treatment well Patient left: in bed;with call bell/phone within reach;with bed alarm set;with restraints  reapplied Nurse Communication: Mobility status PT Visit Diagnosis: Unsteadiness on feet (R26.81);Difficulty in walking, not elsewhere classified (R26.2);Other abnormalities of gait and mobility (R26.89);Other symptoms and signs involving the nervous system (R29.898);Muscle weakness (generalized) (M62.81)     Time: 8359-8293 PT Time Calculation (min) (ACUTE ONLY): 26 min  Charges:    $Therapeutic Activity: 23-37 mins PT General Charges $$ ACUTE PT VISIT: 1 Visit                    Dorothyann Maier, DPT, CLT  Acute Rehabilitation Services Office: [phone] (Secure chat preferred)    Dorothyann VEAR Maier 02/01/2024, 5:24 PM

## 2024-02-02 DIAGNOSIS — I610 Nontraumatic intracerebral hemorrhage in hemisphere, subcortical: Secondary | ICD-10-CM | POA: Diagnosis not present

## 2024-02-02 LAB — GLUCOSE, CAPILLARY
Glucose-Capillary: 112 mg/dL — ABNORMAL HIGH (ref 70–99)
Glucose-Capillary: 115 mg/dL — ABNORMAL HIGH (ref 70–99)
Glucose-Capillary: 129 mg/dL — ABNORMAL HIGH (ref 70–99)
Glucose-Capillary: 137 mg/dL — ABNORMAL HIGH (ref 70–99)
Glucose-Capillary: 145 mg/dL — ABNORMAL HIGH (ref 70–99)
Glucose-Capillary: 73 mg/dL (ref 70–99)
Glucose-Capillary: 94 mg/dL (ref 70–99)

## 2024-02-02 NOTE — Plan of Care (Signed)
  Problem: Education: Goal: Ability to describe self-care measures that may prevent or decrease complications (Diabetes Survival Skills Education) will improve Outcome: Progressing   Problem: Coping: Goal: Ability to adjust to condition or change in health will improve Outcome: Progressing   Problem: Fluid Volume: Goal: Ability to maintain a balanced intake and output will improve Outcome: Progressing   Problem: Health Behavior/Discharge Planning: Goal: Ability to identify and utilize available resources and services will improve Outcome: Progressing Goal: Ability to manage health-related needs will improve Outcome: Progressing   Problem: Metabolic: Goal: Ability to maintain appropriate glucose levels will improve Outcome: Progressing   Problem: Nutritional: Goal: Maintenance of adequate nutrition will improve Outcome: Progressing Goal: Progress toward achieving an optimal weight will improve Outcome: Progressing   Problem: Skin Integrity: Goal: Risk for impaired skin integrity will decrease Outcome: Progressing   Problem: Tissue Perfusion: Goal: Adequacy of tissue perfusion will improve Outcome: Progressing   Problem: Education: Goal: Knowledge of General Education information will improve Description: Including pain rating scale, medication(s)/side effects and non-pharmacologic comfort measures Outcome: Progressing   Problem: Health Behavior/Discharge Planning: Goal: Ability to manage health-related needs will improve Outcome: Progressing   Problem: Clinical Measurements: Goal: Ability to maintain clinical measurements within normal limits will improve Outcome: Progressing Goal: Will remain free from infection Outcome: Progressing Goal: Diagnostic test results will improve Outcome: Progressing Goal: Respiratory complications will improve Outcome: Progressing Goal: Cardiovascular complication will be avoided Outcome: Progressing   Problem: Activity: Goal:  Risk for activity intolerance will decrease Outcome: Progressing   Problem: Nutrition: Goal: Adequate nutrition will be maintained Outcome: Progressing   Problem: Coping: Goal: Level of anxiety will decrease Outcome: Progressing   Problem: Elimination: Goal: Will not experience complications related to bowel motility Outcome: Progressing Goal: Will not experience complications related to urinary retention Outcome: Progressing   Problem: Pain Managment: Goal: General experience of comfort will improve and/or be controlled Outcome: Progressing   Problem: Safety: Goal: Ability to remain free from injury will improve Outcome: Progressing   Problem: Skin Integrity: Goal: Risk for impaired skin integrity will decrease Outcome: Progressing   Problem: Education: Goal: Knowledge of disease or condition will improve Outcome: Progressing Goal: Knowledge of secondary prevention will improve (MUST DOCUMENT ALL) Outcome: Progressing Goal: Knowledge of patient specific risk factors will improve (DELETE if not current risk factor) Outcome: Progressing   Problem: Intracerebral Hemorrhage Tissue Perfusion: Goal: Complications of Intracerebral Hemorrhage will be minimized Outcome: Progressing   Problem: Coping: Goal: Will verbalize positive feelings about self Outcome: Progressing Goal: Will identify appropriate support needs Outcome: Progressing   Problem: Health Behavior/Discharge Planning: Goal: Ability to manage health-related needs will improve Outcome: Progressing Goal: Goals will be collaboratively established with patient/family Outcome: Progressing   Problem: Self-Care: Goal: Ability to participate in self-care as condition permits will improve Outcome: Progressing Goal: Verbalization of feelings and concerns over difficulty with self-care will improve Outcome: Progressing Goal: Ability to communicate needs accurately will improve Outcome: Progressing   Problem:  Nutrition: Goal: Risk of aspiration will decrease Outcome: Progressing Goal: Dietary intake will improve Outcome: Progressing

## 2024-02-02 NOTE — Plan of Care (Signed)
  Problem: Fluid Volume: Goal: Ability to maintain a balanced intake and output will improve Outcome: Progressing   Problem: Metabolic: Goal: Ability to maintain appropriate glucose levels will improve Outcome: Progressing   Problem: Skin Integrity: Goal: Risk for impaired skin integrity will decrease Outcome: Progressing   Problem: Tissue Perfusion: Goal: Adequacy of tissue perfusion will improve Outcome: Progressing   Problem: Education: Goal: Ability to describe self-care measures that may prevent or decrease complications (Diabetes Survival Skills Education) will improve Outcome: Not Progressing

## 2024-02-02 NOTE — Progress Notes (Signed)
 PROGRESS NOTE    Amanda [name]  FMW:[id number] DOB: 07-Apr-1961 DOA: 01/03/2024 PCP: Patient, No Pcp Per   Brief Narrative:  Patient is a 63 year old female with history of hypertension, cocaine abuse, diabetes type 2 who presented with lethargy, near syncope.  Found to be severely hypertensive, UDS positive for cocaine, with acute intraparenchymal hemorrhage , intraventricular hemorrhage and hydrocephalus.  Patient was admitted to ICU for close blood pressure management.  Neurosurgery consulted.  Status post right frontal ventriculostomy.  EVD eventually removed on 10/3.  Hospital course remarkable for dysphagia.  Speech therapy following.  Dietitian/speech therapy recommending PEG placement because she is not able to tolerate anything by mouth.  PT/OT recommending SNF on discharge.  Underwent PEG placement by IR on 10/16. TOC following    Significant events: 9/22 Admitted and intubated, emergent burr holes and right frontal ventriculostomy placed by neurosurgery due to neurological worsening 9/23 extubated 10/1 EVD clamped 10/3 EVD removed 10/16 PEG placement by IR for worsening dysphagia  Assessment & Plan:   Principal Problem:   ICH (intracerebral hemorrhage) (HCC) Active Problems:   Hypertensive emergency   Tobacco abuse   Cocaine abuse (HCC)   History of alcohol abuse   Chronic combined systolic and diastolic heart failure (HCC)   HTN (hypertension)   Hemorrhagic stroke (HCC)   Malnutrition of moderate degree  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 Continues to receive PT. Plan for discharge to SNF.  EVD removed 01/14/2024, scalp staples removed 02/01/2024.   Hypertension: Patient has been on amlodipine  10 mg daily, carvedilol  25 mg twice daily, Entresto .  Patient's systolic blood pressure has been mostly under 120 and diastolic under 80.  She does not need all those  medications.  Due to the fact that patient already has systolic heart failure history, I will discontinue amlodipine .   Dysphagia:  S/P PEG placement on 10/16.  Now has progressed to dysphagia 3 diet but not having enough p.o. intake to meet nutritional demands of the body and thus she is still on PEG tube feedings as well.    Chronic combined systolic/diastolic CHF: Stable, continue Entresto  and spironolactone .  Not on aspirin  due to recent hemorrhage.    Type 2 diabetes: Recent A1c of 6.6.  Glucose controlled on sliding scale insulin .   Cocaine abuse: Consulted TOC for cocaine cessation resources.  DVT prophylaxis: SCD's Start: 01/11/24 2301 heparin  injection 5,000 Units Start: 01/05/24 1015 Place and maintain sequential compression device Start: 01/03/24 0611 SCDs Start: 01/03/24 0429   Code Status: Full Code  Family Communication:  None present at bedside.   Status is: Inpatient Remains inpatient appropriate because: Medically stable, pending placement.   Estimated body mass index is 20.4 kg/m as calculated from the following:   Height as of this encounter: 5' 5 (1.651 m).   Weight as of this encounter: 55.6 kg.    Nutritional Assessment: Body mass index is 20.4 kg/m.[name] [name].  I agree with the assessment and plan as outlined below: Nutrition Status: Nutrition Problem: Moderate Malnutrition Etiology: social / environmental circumstances (polysubstance abuse) Signs/Symptoms: mild muscle depletion, moderate fat depletion, mild fat depletion, moderate muscle depletion Interventions: Prostat, Tube feeding  . Skin Assessment: I have examined the patient's skin and I agree with the wound assessment as performed by the wound care RN as outlined below:    Consultants:  Neurosurgery and IR  Procedures:  As above  Antimicrobials:  Anti-infectives (From admission, onward)  Start     Dose/Rate Route Frequency Ordered Stop   01/27/24 0807  ceFAZolin (ANCEF)  IVPB 2g/100 mL premix        over 30 Minutes Intravenous Continuous PRN 01/27/24 0807 01/27/24 0807   01/13/24 0600  ceFAZolin (ANCEF) IVPB 2g/100 mL premix        2 g 200 mL/hr over 30 Minutes Intravenous On call to O.R. 01/11/24 2300 01/13/24 0557         Subjective: Patient seen and examined.  No complaints.  Objective: Vitals:   02/01/24 1523 02/01/24 1949 02/02/24 0500 02/02/24 0745  BP: 126/80 108/83  101/83  Pulse: 75 68  69  Resp: 15 19  18   Temp: 98 F (36.7 C) 98.8 F (37.1 C)  97.8 F (36.6 C)  TempSrc: Oral     SpO2: 91% 92%  100%  Weight:   55.6 kg   Height:        Intake/Output Summary (Last 24 hours) at 02/02/2024 0913 Last data filed at 02/02/2024 0856 Gross per 24 hour  Intake 390 ml  Output 450 ml  Net -60 ml   Filed Weights   01/28/24 0500 01/29/24 0738 02/02/24 0500  Weight: 53 kg 53.8 kg 55.6 kg    Examination:  General exam: Appears calm and comfortable  Respiratory system: Clear to auscultation. Respiratory effort normal. Cardiovascular system: S1 & S2 heard, RRR. No JVD, murmurs, rubs, gallops or clicks. No pedal edema. Gastrointestinal system: Abdomen is nondistended, soft and nontender. No organomegaly or masses felt. Normal bowel sounds heard. Central nervous system: Alert and oriented x 2.  Left hemiparesis.  Data Reviewed: I have personally reviewed following labs and imaging studies  CBC: Recent Labs  Lab 01/29/24 0633  WBC 7.2  HGB 13.3  HCT 40.1  MCV 86.6  PLT 284   Basic Metabolic Panel: Recent Labs  Lab 01/29/24 0633  NA 135  K 4.0  CL 99  CO2 24  GLUCOSE 173*  BUN 36*  CREATININE 0.86  CALCIUM 9.9   GFR: Estimated Creatinine Clearance: 59.5 mL/min (by C-G formula based on SCr of 0.86 mg/dL). Liver Function Tests: No results for input(s): AST, ALT, ALKPHOS, BILITOT, PROT, ALBUMIN in the last 168 hours. No results for input(s): LIPASE, AMYLASE in the last 168 hours. No results for input(s):  AMMONIA in the last 168 hours. Coagulation Profile: Recent Labs  Lab 01/26/24 1507  INR 1.1   Cardiac Enzymes: No results for input(s): CKTOTAL, CKMB, CKMBINDEX, TROPONINI in the last 168 hours. BNP (last 3 results) No results for input(s): PROBNP in the last 8760 hours. HbA1C: No results for input(s): HGBA1C in the last 72 hours. CBG: Recent Labs  Lab 02/01/24 1529 02/01/24 2130 02/02/24 0013 02/02/24 0405 02/02/24 0746  GLUCAP 118* 137* 94 137* 129*   Lipid Profile: No results for input(s): CHOL, HDL, LDLCALC, TRIG, CHOLHDL, LDLDIRECT in the last 72 hours. Thyroid Function Tests: No results for input(s): TSH, T4TOTAL, FREET4, T3FREE, THYROIDAB in the last 72 hours. Anemia Panel: No results for input(s): VITAMINB12, FOLATE, FERRITIN, TIBC, IRON, RETICCTPCT in the last 72 hours. Sepsis Labs: No results for input(s): PROCALCITON, LATICACIDVEN in the last 168 hours.  No results found for this or any previous visit (from the past 240 hours).   Radiology Studies: No results found.  Scheduled Meds:  amLODipine   10 mg Per Tube Daily   carvedilol   25 mg Per Tube BID WC   feeding supplement (PROSource TF20)  60 mL Per Tube  Daily   free water   60 mL Per Tube Q4H   heparin  injection (subcutaneous)  5,000 Units Subcutaneous Q8H   insulin  aspart  0-9 Units Subcutaneous Q4H   mouth rinse  15 mL Mouth Rinse 4 times per day   QUEtiapine   25 mg Per Tube QHS   sacubitril -valsartan   1 tablet Per Tube BID   spironolactone   25 mg Per Tube Daily   Continuous Infusions:  feeding supplement (OSMOLITE 1.5 CAL) 1,000 mL (01/29/24 0029)     LOS: 30 days   Fredia Skeeter, MD [facility]  02/02/2024, 9:13 AM   *Please note that this is a verbal dictation therefore any spelling or grammatical errors are due to the Dragon Medical One system interpretation.  Please page via Amion and do not message via secure chat for urgent  patient care matters. Secure chat can be used for non urgent patient care matters.  How to contact the [facility] Attending or Consulting provider 7A - 7P or covering provider during after hours 7P -7A, for this patient?  Check the care team in [facility] and look for a) attending/consulting [facility] provider listed and b) the [facility] team listed. Page or secure chat 7A-7P. Log into [email or web address] and use [place]'s universal password to access. If you do not have the password, please contact the hospital operator. Locate the [facility] provider you are looking for under [facility] and page to a number that you can be directly reached. If you still have difficulty reaching the provider, please page the [facility] (Director on Call) for the Hospitalists listed on amion for assistance.

## 2024-02-03 DIAGNOSIS — I610 Nontraumatic intracerebral hemorrhage in hemisphere, subcortical: Secondary | ICD-10-CM | POA: Diagnosis not present

## 2024-02-03 LAB — GLUCOSE, CAPILLARY
Glucose-Capillary: 129 mg/dL — ABNORMAL HIGH (ref 70–99)
Glucose-Capillary: 105 mg/dL — ABNORMAL HIGH (ref 70–99)
Glucose-Capillary: 128 mg/dL — ABNORMAL HIGH (ref 70–99)
Glucose-Capillary: 132 mg/dL — ABNORMAL HIGH (ref 70–99)
Glucose-Capillary: 112 mg/dL — ABNORMAL HIGH (ref 70–99)

## 2024-02-03 MED ORDER — STERILE WATER FOR INJECTION IJ SOLN
INTRAMUSCULAR | Status: AC
Start: 2024-02-03 — End: 2024-02-03
  Administered 2024-02-03: 10 mL
  Filled 2024-02-03: qty 10

## 2024-02-03 MED ORDER — STERILE WATER FOR INJECTION IJ SOLN
INTRAMUSCULAR | Status: AC
  Administered 2024-02-03: 10 mL
  Filled 2024-02-03: qty 10

## 2024-02-03 NOTE — TOC Progression Note (Signed)
 Transition of Care [facility]) - Progression Note    Patient Details  Name: Amanda [name] MRN: [id number] Date of Birth: Jan 20, 1961  [facility] CM/SW Contact  Almarie CHRISTELLA Goodie, [place] Phone Number: 02/03/2024, 10:11 AM  Clinical Narrative:   CSW continuing to follow for disposition. Patient with no bed offers for SNF at this time. CSW to follow.    Expected Discharge Plan: Skilled Nursing Facility Barriers to Discharge: English as a second language teacher, Continued Medical Work up, Inadequate or no insurance, Facility will not accept until restraint criteria met, SNF Pending bed offer               Expected Discharge Plan and Services In-house Referral: Clinical Social Work   Post Acute Care Choice: Skilled Nursing Facility Living arrangements for the past 2 months: Single Family Home                                       Social Drivers of Health (SDOH) Interventions SDOH Screenings   Food Insecurity: No Food Insecurity (07/16/2023)   Received from [facility]  Housing: Low Risk  (07/16/2023)   Received from [facility]  Transportation Needs: No Transportation Needs (07/16/2023)   Received from [facility]  Utilities: Not At Risk (07/16/2023)   Received from [facility]  Alcohol Screen: Low Risk  (05/14/2023)  Financial Resource Strain: Low Risk  (07/16/2023)   Received from [facility]  Social Connections: Unknown (10/19/2022)   Received from [facility]  Tobacco Use: High Risk (01/26/2024)    Readmission Risk Interventions    03/26/2022   10:50 AM  Readmission Risk Prevention Plan  Transportation Screening Complete  HRI or Home Care Consult Complete  Social Work Consult for Recovery Care Planning/Counseling Complete  Palliative Care Screening Not Applicable  Medication Review Oceanographer) Referral to Pharmacy

## 2024-02-03 NOTE — Plan of Care (Signed)
  Problem: Metabolic: Goal: Ability to maintain appropriate glucose levels will improve Outcome: Progressing   Problem: Skin Integrity: Goal: Risk for impaired skin integrity will decrease Outcome: Progressing   Problem: Tissue Perfusion: Goal: Adequacy of tissue perfusion will improve Outcome: Progressing   Problem: Safety: Goal: Ability to remain free from injury will improve Outcome: Progressing   Problem: Education: Goal: Ability to describe self-care measures that may prevent or decrease complications (Diabetes Survival Skills Education) will improve Outcome: Not Progressing

## 2024-02-03 NOTE — Progress Notes (Signed)
 Nutrition Follow-up  DOCUMENTATION CODES:  Non-severe (moderate) malnutrition in context of social or environmental circumstances (polysubstance abuse)  INTERVENTION:  Continue tube feeding via PEG: Osmolite 1.5 at 40 ml/h (960 ml per day) Prosource TF20 60 ml 1x/d Free water : 60mL every 4 hours Provides 1520 kcal, 80 gm protein, 731 ml free water  daily (1091mL flush + TF) Continue current diet as ordered per SLP Encourage PO intake as able  When ready to discharge, adjust to bolus feeds: Osmolite 1.5, administer 1 carton 5x/d (5 cartons total per day) Administer 1/2 carton of formula for first two feeds and then increase to a full carton Free water : 50mL before and after each feed ( 5x/d) This will provide 1775 kcal, 75g protein, and of water  per day ( free water  TF+flush)  NUTRITION DIAGNOSIS:  Moderate Malnutrition related to social / environmental circumstances (polysubstance abuse) as evidenced by mild muscle depletion, moderate fat depletion, mild fat depletion, moderate muscle depletion. - remains applicable  GOAL:  Patient will meet greater than or equal to 90% of their needs - met with TF at goal  MONITOR:  TF tolerance, Vent status, Labs  REASON FOR ASSESSMENT:  Consult Enteral/tube feeding initiation and management  ASSESSMENT:  Pt with hx HTN, CHF, type 2 diabetes, polysubstance abuse, and pulmonary edema. Admitted with AMS, lethargy, and deteriorating condition requiring intubation; diagnosed ICH.  9/22 - admitted, intubated, EVD placed 9/23 - extubated 9/24 - s/p cortrak placement; tip gastric  9/25 - Diet advanced to Dysphagia 2/thin; TF stopped 9/26 - back down to Full liquids, TF resumed  10/13- upgraded to DYS 3, but SLP recommends PEG 10/16 - PEG placed in IR  Patient seen in room resting, would no interact when spoken to. Tube feeds continue via PEG, Osmolite 1.5 @ 40 ml/hr with prosource BID.  Poor po intake continues. Breakfast tray  at bedside, untouched. Current disposition is SNF, pending bed offer.     Admit weight: 56.1 kg  Current weight: 52 kg    Average Meal Intake: 10/6-10/8: 0% intake x 4 recorded meals 10/6-10/12: 0% intake x 7 recorded meals 10/14-10/15: 14% x 4 recorded meals 10/17-10/23: 15% x 4 recorded meals   Nutritionally Relevant Medications:  SSI 0-9 units q 4 hrs    Labs Reviewed: CBG 73-132   Diet Order:   Diet Order             DIET DYS 3 Fluid consistency: Thin  Diet effective now                  EDUCATION NEEDS:  Not appropriate for education at this time  Skin:  Skin Assessment: Reviewed RN Assessment  Last BM:  10/21  Height:  Ht Readings from Last 1 Encounters:  01/03/24 5' 5 (1.651 m)    Weight:  Wt Readings from Last 1 Encounters:  02/03/24 52 kg    Ideal Body Weight:  56.8 kg  BMI:  Body mass index is 19.08 kg/m.  Estimated Nutritional Needs:  Kcal:  1500-1700 Protein:  80-90 grams Fluid:  1.5-1.7L   Amanda Muldoon, MS, RD, LDN Clinical Dietitian  Contact via secure chat. If unavailable, use group chat RD Inpatient.

## 2024-02-03 NOTE — Progress Notes (Signed)
 Physical Therapy Treatment Patient Details Name: Amanda [name] MRN: [id number] DOB: July 21, 1960 Today's Date: 02/03/2024   History of Present Illness 63 yo female presents to [facility] on 9/22 with lethargy and near-syncope s/p cocaine and opioid ingestion. 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 S/p R frontal ventriculostomy, burr holes 9/22. ETT 9/22-9/23. PMHx of cocaine abuse, CHF, CKD, chronic cough, HTN and DM2.    PT Comments  The pt is alert and is participating in conversation, even initially making jokes at the start of the session. However, she is restless, impulsive, and has little to no awareness of her deficits and safety. She insisted on trying to do things on her own. However, she would lean so far to the L she would be resting and moving her trunk and head along the L bed rails when trying to sit up EOB (pillow placed under head throughout to ensure safety). While sitting EOB she maintained a L lateral lean and often would have LOB posteriorly and to the L, needing mod-maxA to recover. Attempted to educate pt on her injuries impacting her midline alignment but pt not very receptive. She did progress to sitting statically with just a mild L lean with CGA-minA, but as the session progressed and the pt became increasing irritated at the therapist for providing her with assistance to ensure her safety she began to threaten to whoop your butt and began to threaten to throw herself over. She actually did throw herself over to the L and R while sitting EOB (pillow placed strategically around in bed to ensure her safety though). Thus, instead of transferring pt to sit up in her recliner, pt was returned to bed for her safety. Before this, she was able to transfer to stand 3x with modA and RW, but continued to lean posteriorly with her L foot sliding anteriorly. As the pt continued to display L inattention  and weakness, she had difficulty lifting her L leg to attempt to march in place. Will continue to follow acutely. At this time, recommend +2 assist using the stedy and for pt to have the head start chair alarm belt on with fall pads down if pt is transferred OOB to the chair.     If plan is discharge home, recommend the following: Two people to help with walking and/or transfers;Supervision due to cognitive status;Assist for transportation;Help with stairs or ramp for entrance;Assistance with cooking/housework;Direct supervision/assist for medications management;Direct supervision/assist for financial management   Can travel by private vehicle     No  Equipment Recommendations  Hospital bed;Wheelchair (measurements PT);Wheelchair cushion (measurements PT);Hoyer lift;BSC/3in1;Rolling walker (2 wheels)    Recommendations for Other Services       Precautions / Restrictions Precautions Precautions: Fall;Other (comment) Recall of Precautions/Restrictions: Impaired Precaution/Restrictions Comments: PEG, SBP < 160, pushes to L, impulsive, irritable Restrictions Weight Bearing Restrictions Per Provider Order: No     Mobility  Bed Mobility Overal bed mobility: Needs Assistance Bed Mobility: Supine to Sit     Supine to sit: Max assist Sit to supine: Total assist   General bed mobility comments: Pt requesting R HHA to pull up to sit up, maxA. However, upon sitting up pt then leaned posteriorly and to the L. Placed pillow along L rails of bed as pt insisted on no help being provided by therapist and she inched her trunk and head towards the end of bed rail. However, pt unable to push adequately through L UE to ascend  trunk to sit upright, needing modA to do so. Pt resisting movement and becoming irritated end of session and thus required total assist to return to supine.    Transfers Overall transfer level: Needs assistance Equipment used: Rolling walker (2 wheels) Transfers: Sit to/from  Stand Sit to Stand: Mod assist           General transfer comment: Pt stood 3x from EOB to RW, leaning posteriorly with L foot sliding anteriorly and needing blocking. Max multi-modal cues provided for pt to extend hips and stand upright to reduce posterior lean, momentary success noted intermittently. ModA needed to power up to stand each rep. Unable to progress to transferring to chair to her L as planned though as pt often began to shift her hips to her R instead and was resistive to therapist assisting her and began to threaten to throw herself over if therapist helped her. returned pt to sit on EOB and then supine for her safety    Ambulation/Gait             Pre-gait activities: Lateral weight shifting and cues to march in place in RW, but pt unable to shift weight adequately to offload L and unable to lift L leg when cued. Pt pivoted on R foot intermittently. Pt needed cues and assistance to keep her RW close and to extend her hips to reduce her posterior lean, mod-maxA for standing balance     Stairs             Wheelchair Mobility     Tilt Bed    Modified Rankin (Stroke Patients Only) Modified Rankin (Stroke Patients Only) Pre-Morbid Rankin Score: No symptoms Modified Rankin: Severe disability     Balance Overall balance assessment: Needs assistance Sitting-balance support: Feet supported, Single extremity supported, Bilateral upper extremity supported Sitting balance-Leahy Scale: Poor Sitting balance - Comments: Pt with L lateral lean, often losing balance posteriorly and to the L, sometimes due to actively throwing herself over due to irritation with therapist assisting her (pillow placed posterior and L to ensure safety when pt would do this). Pt would need mod-maxA to recover LOB. Pt needing CGA-minA to maintain static sitting balance otherwise. Postural control: Posterior lean, Left lateral lean Standing balance support: Bilateral upper extremity  supported, During functional activity, Reliant on assistive device for balance Standing balance-Leahy Scale: Poor Standing balance comment: reliant on RW and mod-maxA, posterior lean noted                            Communication Communication Communication: Impaired Factors Affecting Communication: Reduced clarity of speech  Cognition Arousal: Alert Behavior During Therapy: Impulsive, Agitated, Restless   PT - Cognitive impairments: Awareness, Attention, Safety/Judgement, Sequencing, Initiation, Problem solving, Memory                       PT - Cognition Comments: Pt alert and joking initially, oriented to self and that she is in a hospital. Pt unaware of her deficits impacting her safety as she is restlessly throwing her R leg over R EOB and impulsively trying to mobilize, insisting on not having any assistance from therapist. Pt unaware of her L lean and safety, insisting to sit up on her own yet gradually running her trunk and head along the L rail even when sitting EOB (pillow placed along rail as she went to ensure safety). Pt became increased irritated at therapist for providing assistance despite  max education from therapist on her deficits and safety concerns. Pt threatening to throw herself over anytime therapist attempted to assist her, thus instead following the plan to go to the chair the pt was returned to supine for her safety Following commands: Impaired Following commands impaired: Follows one step commands inconsistently, Follows one step commands with increased time    Cueing Cueing Techniques: Verbal cues, Tactile cues, Visual cues, Gestural cues  Exercises      General Comments General comments (skin integrity, edema, etc.): educated pt on her injuries resulting in poor safety awareness and midline awareness but pt unreceptive to education      Pertinent Vitals/Pain Pain Assessment Pain Assessment: Faces Faces Pain Scale: No hurt Pain  Intervention(s): Monitored during session    Home Living                          Prior Function            PT Goals (current goals can now be found in the care plan section) Acute Rehab PT Goals Patient Stated Goal: to go home PT Goal Formulation: With patient Time For Goal Achievement: 02/15/24 Potential to Achieve Goals: Fair Progress towards PT goals: Progressing toward goals    Frequency    Min 1X/week      PT Plan      Co-evaluation              AM-PAC PT 6 Clicks Mobility   Outcome Measure  Help needed turning from your back to your side while in a flat bed without using bedrails?: A Lot Help needed moving from lying on your back to sitting on the side of a flat bed without using bedrails?: A Lot Help needed moving to and from a bed to a chair (including a wheelchair)?: Total Help needed standing up from a chair using your arms (e.g., wheelchair or bedside chair)?: A Lot Help needed to walk in hospital room?: Total Help needed climbing 3-5 steps with a railing? : Total 6 Click Score: 9    End of Session Equipment Utilized During Treatment: Gait belt Activity Tolerance: Treatment limited secondary to agitation (pt began to threaten and actively attempt to throw herself over when therapist tried to assist her) Patient left: in bed;with call bell/phone within reach;with bed alarm set Nurse Communication: Mobility status;Other (comment) (pt's poor safety awareness and threats) PT Visit Diagnosis: Unsteadiness on feet (R26.81);Difficulty in walking, not elsewhere classified (R26.2);Other abnormalities of gait and mobility (R26.89);Other symptoms and signs involving the nervous system (R29.898);Muscle weakness (generalized) (M62.81)     Time: 8368-8294 PT Time Calculation (min) (ACUTE ONLY): 34 min  Charges:    $Therapeutic Activity: 8-22 mins $Neuromuscular Re-education: 8-22 mins PT General Charges $$ ACUTE PT VISIT: 1 Visit                      Theo Ferretti, PT, DPT Acute Rehabilitation Services  Office: [phone]    Theo CHRISTELLA Ferretti 02/03/2024, 5:23 PM

## 2024-02-03 NOTE — Progress Notes (Signed)
 PROGRESS NOTE    Amanda [name]  FMW:[id number] DOB: Jul 15, 1960 DOA: 01/03/2024 PCP: Patient, No Pcp Per   Brief Narrative:  Patient is a 63 year old female with history of hypertension, cocaine abuse, diabetes type 2 who presented with lethargy, near syncope.  Found to be severely hypertensive, UDS positive for cocaine, with acute intraparenchymal hemorrhage , intraventricular hemorrhage and hydrocephalus.  Patient was admitted to ICU for close blood pressure management.  Neurosurgery consulted.  Status post right frontal ventriculostomy.  EVD eventually removed on 10/3.  Hospital course remarkable for dysphagia.  Speech therapy following.  Dietitian/speech therapy recommending PEG placement because she is not able to tolerate anything by mouth.  PT/OT recommending SNF on discharge.  Underwent PEG placement by IR on 10/16. TOC following    Significant events: 9/22 Admitted and intubated, emergent burr holes and right frontal ventriculostomy placed by neurosurgery due to neurological worsening 9/23 extubated 10/1 EVD clamped 10/3 EVD removed 10/16 PEG placement by IR for worsening dysphagia  Assessment & Plan:   Principal Problem:   ICH (intracerebral hemorrhage) (HCC) Active Problems:   Hypertensive emergency   Tobacco abuse   Cocaine abuse (HCC)   History of alcohol abuse   Chronic combined systolic and diastolic heart failure (HCC)   HTN (hypertension)   Hemorrhagic stroke (HCC)   Malnutrition of moderate degree  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 Continues to receive PT. Plan for discharge to SNF.  EVD removed 01/14/2024, scalp staples removed 02/01/2024.   Hypertension: Patient has been on amlodipine  10 mg daily, carvedilol  25 mg twice daily, Entresto .  Patient's systolic blood pressure has been mostly under 120 and diastolic under 80.  She does not need all those  medications.  Due to the fact that patient already has systolic heart failure history, I will discontinue amlodipine .   Dysphagia:  S/P PEG placement on 10/16.  Now has progressed to dysphagia 3 diet but not having enough p.o. intake to meet nutritional demands of the body and thus she is still on PEG tube feedings as well.    Chronic combined systolic/diastolic CHF: Stable, continue Entresto  and spironolactone .  Not on aspirin  due to recent hemorrhage.    Type 2 diabetes: Recent A1c of 6.6.  Glucose controlled on sliding scale insulin .   Cocaine abuse: Consulted TOC for cocaine cessation resources.  DVT prophylaxis: SCD's Start: 01/11/24 2301 heparin  injection 5,000 Units Start: 01/05/24 1015 Place and maintain sequential compression device Start: 01/03/24 0611 SCDs Start: 01/03/24 0429   Code Status: Full Code  Family Communication:  None present at bedside.   Status is: Inpatient Remains inpatient appropriate because: Medically stable, pending placement.   Estimated body mass index is 19.08 kg/m as calculated from the following:   Height as of this encounter: 5' 5 (1.651 m).   Weight as of this encounter: 52 kg.    Nutritional Assessment: Body mass index is 19.08 kg/m.Amanda [name] [name].  I agree with the assessment and plan as outlined below: Nutrition Status: Nutrition Problem: Moderate Malnutrition Etiology: social / environmental circumstances (polysubstance abuse) Signs/Symptoms: mild muscle depletion, moderate fat depletion, mild fat depletion, moderate muscle depletion Interventions: Prostat, Tube feeding  . Skin Assessment: I have examined the patient's skin and I agree with the wound assessment as performed by the wound care RN as outlined below:    Consultants:  Neurosurgery and IR  Procedures:  As above  Antimicrobials:  Anti-infectives (From admission, onward)  Start     Dose/Rate Route Frequency Ordered Stop   01/27/24 0807  ceFAZolin (ANCEF)  IVPB 2g/100 mL premix        over 30 Minutes Intravenous Continuous PRN 01/27/24 0807 01/27/24 0807   01/13/24 0600  ceFAZolin (ANCEF) IVPB 2g/100 mL premix        2 g 200 mL/hr over 30 Minutes Intravenous On call to O.R. 01/11/24 2300 01/13/24 0557         Subjective: Seen and examined.  Alert and oriented x 2.  No complaints.  Objective: Vitals:   02/03/24 0000 02/03/24 0356 02/03/24 0500 02/03/24 0750  BP: 134/74 124/87  125/69  Pulse: 63 (!) 52  62  Resp: 18   19  Temp: 97.8 F (36.6 C) 97.8 F (36.6 C)  98.7 F (37.1 C)  TempSrc: Oral Oral    SpO2: 95% 97%  98%  Weight:   52 kg   Height:        Intake/Output Summary (Last 24 hours) at 02/03/2024 1124 Last data filed at 02/03/2024 0821 Gross per 24 hour  Intake 360 ml  Output --  Net 360 ml   Filed Weights   01/29/24 0738 02/02/24 0500 02/03/24 0500  Weight: 53.8 kg 55.6 kg 52 kg    Examination:  General exam: Appears calm and comfortable  Respiratory system: Clear to auscultation. Respiratory effort normal. Cardiovascular system: S1 & S2 heard, RRR. No JVD, murmurs, rubs, gallops or clicks. No pedal edema. Gastrointestinal system: Abdomen is nondistended, soft and nontender. No organomegaly or masses felt. Normal bowel sounds heard. Central nervous system: Alert and oriented x 2.  Left hemiparesis.  Data Reviewed: I have personally reviewed following labs and imaging studies  CBC: Recent Labs  Lab 01/29/24 0633  WBC 7.2  HGB 13.3  HCT 40.1  MCV 86.6  PLT 284   Basic Metabolic Panel: Recent Labs  Lab 01/29/24 0633  NA 135  K 4.0  CL 99  CO2 24  GLUCOSE 173*  BUN 36*  CREATININE 0.86  CALCIUM 9.9   GFR: Estimated Creatinine Clearance: 55.7 mL/min (by C-G formula based on SCr of 0.86 mg/dL). Liver Function Tests: No results for input(s): AST, ALT, ALKPHOS, BILITOT, PROT, ALBUMIN in the last 168 hours. No results for input(s): LIPASE, AMYLASE in the last 168 hours. No  results for input(s): AMMONIA in the last 168 hours. Coagulation Profile: No results for input(s): INR, PROTIME in the last 168 hours.  Cardiac Enzymes: No results for input(s): CKTOTAL, CKMB, CKMBINDEX, TROPONINI in the last 168 hours. BNP (last 3 results) No results for input(s): PROBNP in the last 8760 hours. HbA1C: No results for input(s): HGBA1C in the last 72 hours. CBG: Recent Labs  Lab 02/02/24 1552 02/02/24 1958 02/02/24 2352 02/03/24 0400 02/03/24 0752  GLUCAP 73 112* 115* 129* 105*   Lipid Profile: No results for input(s): CHOL, HDL, LDLCALC, TRIG, CHOLHDL, LDLDIRECT in the last 72 hours. Thyroid Function Tests: No results for input(s): TSH, T4TOTAL, FREET4, T3FREE, THYROIDAB in the last 72 hours. Anemia Panel: No results for input(s): VITAMINB12, FOLATE, FERRITIN, TIBC, IRON, RETICCTPCT in the last 72 hours. Sepsis Labs: No results for input(s): PROCALCITON, LATICACIDVEN in the last 168 hours.  No results found for this or any previous visit (from the past 240 hours).   Radiology Studies: No results found.  Scheduled Meds:  carvedilol   25 mg Per Tube BID WC   feeding supplement (PROSource TF20)  60 mL Per Tube Daily  free water   60 mL Per Tube Q4H   heparin  injection (subcutaneous)  5,000 Units Subcutaneous Q8H   insulin  aspart  0-9 Units Subcutaneous Q4H   mouth rinse  15 mL Mouth Rinse 4 times per day   QUEtiapine   25 mg Per Tube QHS   sacubitril -valsartan   1 tablet Per Tube BID   spironolactone   25 mg Per Tube Daily   Continuous Infusions:  feeding supplement (OSMOLITE 1.5 CAL) 1,000 mL (01/29/24 0029)     LOS: 31 days   Fredia Skeeter, MD [facility]  02/03/2024, 11:24 AM   *Please note that this is a verbal dictation therefore any spelling or grammatical errors are due to the Dragon Medical One system interpretation.  Please page via Amion and do not message via secure chat for  urgent patient care matters. Secure chat can be used for non urgent patient care matters.  How to contact the [facility] Attending or Consulting provider 7A - 7P or covering provider during after hours 7P -7A, for this patient?  Check the care team in [facility] and look for a) attending/consulting TR[facility] provider listed and b) the [facility] team listed. Page or secure chat 7A-7P. Log into [email or web address] and use [place]'s universal password to access. If you do not have the password, please contact the hospital operator. Locate the [facility] provider you are looking for under [facility] and page to a number that you can be directly reached. If you still have difficulty reaching the provider, please page the [facility] (Director on Call) for the Hospitalists listed on amion for assistance.

## 2024-02-04 DIAGNOSIS — I610 Nontraumatic intracerebral hemorrhage in hemisphere, subcortical: Secondary | ICD-10-CM | POA: Diagnosis not present

## 2024-02-04 DIAGNOSIS — I161 Hypertensive emergency: Secondary | ICD-10-CM | POA: Diagnosis not present

## 2024-02-04 DIAGNOSIS — I619 Nontraumatic intracerebral hemorrhage, unspecified: Secondary | ICD-10-CM | POA: Diagnosis not present

## 2024-02-04 LAB — GLUCOSE, CAPILLARY
Glucose-Capillary: 110 mg/dL — ABNORMAL HIGH (ref 70–99)
Glucose-Capillary: 122 mg/dL — ABNORMAL HIGH (ref 70–99)
Glucose-Capillary: 134 mg/dL — ABNORMAL HIGH (ref 70–99)
Glucose-Capillary: 141 mg/dL — ABNORMAL HIGH (ref 70–99)
Glucose-Capillary: 89 mg/dL (ref 70–99)
Glucose-Capillary: 91 mg/dL (ref 70–99)

## 2024-02-04 NOTE — Plan of Care (Signed)
  Problem: Metabolic: Goal: Ability to maintain appropriate glucose levels will improve Outcome: Progressing   Problem: Skin Integrity: Goal: Risk for impaired skin integrity will decrease Outcome: Progressing   Problem: Tissue Perfusion: Goal: Adequacy of tissue perfusion will improve Outcome: Progressing   Problem: Education: Goal: Ability to describe self-care measures that may prevent or decrease complications (Diabetes Survival Skills Education) will improve Outcome: Not Progressing

## 2024-02-04 NOTE — Progress Notes (Signed)
 PROGRESS NOTE    Amanda [name]  FMW:[id number] DOB: Jan 24, 1961 DOA: 01/03/2024 PCP: Patient, No Pcp Per   Brief Narrative:  Patient is a 63 year old female with history of hypertension, cocaine abuse, diabetes type 2 who presented with lethargy, near syncope.  Found to be severely hypertensive, UDS positive for cocaine, with acute intraparenchymal hemorrhage , intraventricular hemorrhage and hydrocephalus.  Patient was admitted to ICU for close blood pressure management.  Neurosurgery consulted.  Status post right frontal ventriculostomy.  EVD eventually removed on 10/3.  Hospital course remarkable for dysphagia.  Speech therapy following.  Dietitian/speech therapy recommending PEG placement because she is not able to tolerate anything by mouth.  PT/OT recommending SNF on discharge.  Underwent PEG placement by IR on 10/16. TOC following    Significant events: 9/22 Admitted and intubated, emergent burr holes and right frontal ventriculostomy placed by neurosurgery due to neurological worsening 9/23 extubated 10/1 EVD clamped 10/3 EVD removed 10/16 PEG placement by IR for worsening dysphagia  Assessment & Plan:   Principal Problem:   ICH (intracerebral hemorrhage) (HCC) Active Problems:   Hypertensive emergency   Tobacco abuse   Cocaine abuse (HCC)   History of alcohol abuse   Chronic combined systolic and diastolic heart failure (HCC)   HTN (hypertension)   Hemorrhagic stroke (HCC)   Malnutrition of moderate degree  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 Continues to receive PT. Plan for discharge to SNF.  EVD removed 01/14/2024, scalp staples removed 02/01/2024.   Hypertension: Patient has been on amlodipine  10 mg daily, carvedilol  25 mg twice daily, Entresto .  Patient's systolic blood pressure has been mostly under 120 and diastolic under 80.  She does not need all those  medications.  Due to the fact that patient already has systolic heart failure history, amlodipine  discontinued.  BP remains stable.   Dysphagia:  S/P PEG placement on 10/16.  Now has progressed to dysphagia 3 diet but not having enough p.o. intake to meet nutritional demands of the body and thus she is still on PEG tube feedings as well.    Chronic combined systolic/diastolic CHF: Stable, continue Entresto  and spironolactone .  Not on aspirin  due to recent hemorrhage.    Type 2 diabetes: Recent A1c of 6.6.  Glucose controlled on sliding scale insulin .   Cocaine abuse: Consulted TOC for cocaine cessation resources.  DVT prophylaxis: SCD's Start: 01/11/24 2301 heparin  injection 5,000 Units Start: 01/05/24 1015 Place and maintain sequential compression device Start: 01/03/24 0611 SCDs Start: 01/03/24 0429   Code Status: Full Code  Family Communication:  None present at bedside.   Status is: Inpatient Remains inpatient appropriate because: Medically stable, pending placement.   Estimated body mass index is 19 kg/m as calculated from the following:   Height as of this encounter: 5' 5 (1.651 m).   Weight as of this encounter: 51.8 kg.    Nutritional Assessment: Body mass index is 19 kg/m.[name] [name].  I agree with the assessment and plan as outlined below: Nutrition Status: Nutrition Problem: Moderate Malnutrition Etiology: social / environmental circumstances (polysubstance abuse) Signs/Symptoms: mild muscle depletion, moderate fat depletion, mild fat depletion, moderate muscle depletion Interventions: Prostat, Tube feeding  . Skin Assessment: I have examined the patient's skin and I agree with the wound assessment as performed by the wound care RN as outlined below:    Consultants:  Neurosurgery and IR  Procedures:  As above  Antimicrobials:  Anti-infectives (From  admission, onward)    Start     Dose/Rate Route Frequency Ordered Stop   01/27/24 0807  ceFAZolin  (ANCEF) IVPB 2g/100 mL premix        over 30 Minutes Intravenous Continuous PRN 01/27/24 0807 01/27/24 0807   01/13/24 0600  ceFAZolin (ANCEF) IVPB 2g/100 mL premix        2 g 200 mL/hr over 30 Minutes Intravenous On call to O.R. 01/11/24 2300 01/13/24 0557         Subjective: Patient evaluated laying comfortably in bed. States she feels relaxed and has no acute complaints.  Denies any abdominal pain, nausea or vomiting.  Objective: Vitals:   02/03/24 1601 02/03/24 2011 02/04/24 0500 02/04/24 0814  BP: 121/88 (!) 140/83  136/86  Pulse: 74 73  71  Resp: 18 18  19   Temp: 97.8 F (36.6 C) 97.6 F (36.4 C)  97.8 F (36.6 C)  TempSrc: Oral Oral  Oral  SpO2: 96% 96%  97%  Weight:   51.8 kg   Height:        Intake/Output Summary (Last 24 hours) at 02/04/2024 9171 Last data filed at 02/04/2024 0400 Gross per 24 hour  Intake 300 ml  Output --  Net 300 ml   Filed Weights   02/02/24 0500 02/03/24 0500 02/04/24 0500  Weight: 55.6 kg 52 kg 51.8 kg    Examination:  General: Pleasant, well-appearing woman laying in bed. No acute distress. HEENT: [place]/AT. Anicteric sclera CV: RRR. No murmurs, rubs, or gallops. No LE edema Pulmonary: Lungs CTAB. Normal effort. No wheezing or rales. Abdominal: Soft, nontender. Mild distention. Normal bowel sounds. Extremities: Palpable radial and DP pulses. Normal ROM. Skin: Warm and dry. No obvious rash or lesions. Neuro: A&Ox2. Left hemiparesis. Normal sensation to light touch.  Psych: Normal mood and affect   Data Reviewed: I have personally reviewed following labs and imaging studies  CBC: Recent Labs  Lab 01/29/24 0633  WBC 7.2  HGB 13.3  HCT 40.1  MCV 86.6  PLT 284   Basic Metabolic Panel: Recent Labs  Lab 01/29/24 0633  NA 135  K 4.0  CL 99  CO2 24  GLUCOSE 173*  BUN 36*  CREATININE 0.86  CALCIUM 9.9   GFR: Estimated Creatinine Clearance: 55.5 mL/min (by C-G formula based on SCr of 0.86 mg/dL). Liver Function  Tests: No results for input(s): AST, ALT, ALKPHOS, BILITOT, PROT, ALBUMIN in the last 168 hours. No results for input(s): LIPASE, AMYLASE in the last 168 hours. No results for input(s): AMMONIA in the last 168 hours. Coagulation Profile: No results for input(s): INR, PROTIME in the last 168 hours.  Cardiac Enzymes: No results for input(s): CKTOTAL, CKMB, CKMBINDEX, TROPONINI in the last 168 hours. BNP (last 3 results) No results for input(s): PROBNP in the last 8760 hours. HbA1C: No results for input(s): HGBA1C in the last 72 hours. CBG: Recent Labs  Lab 02/03/24 1601 02/03/24 2011 02/04/24 0011 02/04/24 0452 02/04/24 0813  GLUCAP 132* 112* 141* 134* 91   Lipid Profile: No results for input(s): CHOL, HDL, LDLCALC, TRIG, CHOLHDL, LDLDIRECT in the last 72 hours. Thyroid Function Tests: No results for input(s): TSH, T4TOTAL, FREET4, T3FREE, THYROIDAB in the last 72 hours. Anemia Panel: No results for input(s): VITAMINB12, FOLATE, FERRITIN, TIBC, IRON, RETICCTPCT in the last 72 hours. Sepsis Labs: No results for input(s): PROCALCITON, LATICACIDVEN in the last 168 hours.  No results found for this or any previous visit (from the past 240 hours).   Radiology  Studies: No results found.  Scheduled Meds:  carvedilol   25 mg Per Tube BID WC   feeding supplement (PROSource TF20)  60 mL Per Tube Daily   free water   60 mL Per Tube Q4H   heparin  injection (subcutaneous)  5,000 Units Subcutaneous Q8H   insulin  aspart  0-9 Units Subcutaneous Q4H   mouth rinse  15 mL Mouth Rinse 4 times per day   QUEtiapine   25 mg Per Tube QHS   sacubitril -valsartan   1 tablet Per Tube BID   spironolactone   25 mg Per Tube Daily   Continuous Infusions:  feeding supplement (OSMOLITE 1.5 CAL) 1,000 mL (02/03/24 1418)     LOS: 32 days   Claretta CHRISTELLA Alderman, MD [facility]  02/04/2024, 8:28 AM   *Please note that this is  a verbal dictation therefore any spelling or grammatical errors are due to the Dragon Medical One system interpretation.  Please page via Amion and do not message via secure chat for urgent patient care matters. Secure chat can be used for non urgent patient care matters.  How to contact the [facility] Attending or Consulting provider 7A - 7P or covering provider during after hours 7P -7A, for this patient?  Check the care team in [facility] and look for a) attending/consulting TR[facility] provider listed and b) the [facility] team listed. Page or secure chat 7A-7P. Log into [email or web address] and use [place]'s universal password to access. If you do not have the password, please contact the hospital operator. Locate the [facility] provider you are looking for under [facility] and page to a number that you can be directly reached. If you still have difficulty reaching the provider, please page the [facility] (Director on Call) for the Hospitalists listed on amion for assistance.

## 2024-02-05 DIAGNOSIS — I610 Nontraumatic intracerebral hemorrhage in hemisphere, subcortical: Secondary | ICD-10-CM | POA: Diagnosis not present

## 2024-02-05 DIAGNOSIS — I619 Nontraumatic intracerebral hemorrhage, unspecified: Secondary | ICD-10-CM | POA: Diagnosis not present

## 2024-02-05 LAB — GLUCOSE, CAPILLARY
Glucose-Capillary: 122 mg/dL — ABNORMAL HIGH (ref 70–99)
Glucose-Capillary: 126 mg/dL — ABNORMAL HIGH (ref 70–99)
Glucose-Capillary: 126 mg/dL — ABNORMAL HIGH (ref 70–99)
Glucose-Capillary: 108 mg/dL — ABNORMAL HIGH (ref 70–99)
Glucose-Capillary: 111 mg/dL — ABNORMAL HIGH (ref 70–99)

## 2024-02-05 MED ORDER — WHITE PETROLATUM EX OINT: TOPICAL_OINTMENT | CUTANEOUS | Status: DC | PRN

## 2024-02-05 NOTE — Plan of Care (Signed)
  Problem: Education: Goal: Ability to describe self-care measures that may prevent or decrease complications (Diabetes Survival Skills Education) will improve Outcome: Not Progressing   Problem: Coping: Goal: Ability to adjust to condition or change in health will improve Outcome: Not Progressing   Problem: Fluid Volume: Goal: Ability to maintain a balanced intake and output will improve Outcome: Not Progressing   Problem: Health Behavior/Discharge Planning: Goal: Ability to identify and utilize available resources and services will improve Outcome: Not Progressing Goal: Ability to manage health-related needs will improve Outcome: Not Progressing   Problem: Metabolic: Goal: Ability to maintain appropriate glucose levels will improve Outcome: Not Progressing   Problem: Nutritional: Goal: Maintenance of adequate nutrition will improve Outcome: Not Progressing Goal: Progress toward achieving an optimal weight will improve Outcome: Not Progressing   Problem: Skin Integrity: Goal: Risk for impaired skin integrity will decrease Outcome: Not Progressing   Problem: Tissue Perfusion: Goal: Adequacy of tissue perfusion will improve Outcome: Not Progressing   Problem: Education: Goal: Knowledge of General Education information will improve Description: Including pain rating scale, medication(s)/side effects and non-pharmacologic comfort measures Outcome: Not Progressing   Problem: Health Behavior/Discharge Planning: Goal: Ability to manage health-related needs will improve Outcome: Not Progressing   Problem: Clinical Measurements: Goal: Ability to maintain clinical measurements within normal limits will improve Outcome: Not Progressing Goal: Will remain free from infection Outcome: Not Progressing Goal: Diagnostic test results will improve Outcome: Not Progressing Goal: Respiratory complications will improve Outcome: Not Progressing Goal: Cardiovascular complication will  be avoided Outcome: Not Progressing   Problem: Activity: Goal: Risk for activity intolerance will decrease Outcome: Not Progressing   Problem: Nutrition: Goal: Adequate nutrition will be maintained Outcome: Not Progressing   Problem: Coping: Goal: Level of anxiety will decrease Outcome: Not Progressing   Problem: Elimination: Goal: Will not experience complications related to bowel motility Outcome: Not Progressing Goal: Will not experience complications related to urinary retention Outcome: Not Progressing   Problem: Pain Managment: Goal: General experience of comfort will improve and/or be controlled Outcome: Not Progressing   Problem: Safety: Goal: Ability to remain free from injury will improve Outcome: Not Progressing   Problem: Skin Integrity: Goal: Risk for impaired skin integrity will decrease Outcome: Not Progressing   Problem: Education: Goal: Knowledge of disease or condition will improve Outcome: Not Progressing Goal: Knowledge of secondary prevention will improve (MUST DOCUMENT ALL) Outcome: Not Progressing Goal: Knowledge of patient specific risk factors will improve (DELETE if not current risk factor) Outcome: Not Progressing   Problem: Intracerebral Hemorrhage Tissue Perfusion: Goal: Complications of Intracerebral Hemorrhage will be minimized Outcome: Not Progressing   Problem: Coping: Goal: Will verbalize positive feelings about self Outcome: Not Progressing Goal: Will identify appropriate support needs Outcome: Not Progressing   Problem: Health Behavior/Discharge Planning: Goal: Ability to manage health-related needs will improve Outcome: Not Progressing Goal: Goals will be collaboratively established with patient/family Outcome: Not Progressing   Problem: Self-Care: Goal: Ability to participate in self-care as condition permits will improve Outcome: Not Progressing Goal: Verbalization of feelings and concerns over difficulty with  self-care will improve Outcome: Not Progressing Goal: Ability to communicate needs accurately will improve Outcome: Not Progressing   Problem: Nutrition: Goal: Risk of aspiration will decrease Outcome: Not Progressing Goal: Dietary intake will improve Outcome: Not Progressing

## 2024-02-05 NOTE — Progress Notes (Signed)
 PROGRESS NOTE    Amanda [name]  FMW:[id number] DOB: 01/28/1961 DOA: 01/03/2024 PCP: Patient, No Pcp Per   Brief Narrative:  Patient is a 63 year old female with history of hypertension, cocaine abuse, diabetes type 2 who presented with lethargy, near syncope.  Found to be severely hypertensive, UDS positive for cocaine, with acute intraparenchymal hemorrhage , intraventricular hemorrhage and hydrocephalus.  Patient was admitted to ICU for close blood pressure management.  Neurosurgery consulted.  Status post right frontal ventriculostomy. EVD eventually removed on 10/3. Hospital course remarkable for dysphagia.  Speech therapy following.  Dietitian/speech therapy recommending PEG placement because she is not able to tolerate anything by mouth.  PT/OT recommending SNF on discharge.  Underwent PEG placement by IR on 10/16. TOC following    Significant events: 9/22 Admitted and intubated, emergent burr holes and right frontal ventriculostomy placed by neurosurgery due to neurological worsening 9/23 extubated 10/1 EVD clamped 10/3 EVD removed 10/16 PEG placement by IR for worsening dysphagia  Assessment & Plan:   Principal Problem:   ICH (intracerebral hemorrhage) (HCC) Active Problems:   Hypertensive emergency   Tobacco abuse   Cocaine abuse (HCC)   History of alcohol abuse   Chronic combined systolic and diastolic heart failure (HCC)   HTN (hypertension)   Hemorrhagic stroke (HCC)   Malnutrition of moderate degree  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 Continues to receive PT. Plan for discharge to SNF.  EVD removed 01/14/2024, scalp staples removed 02/01/2024.   Hypertension: Patient has been on amlodipine  10 mg daily, carvedilol  25 mg twice daily, Entresto .  Patient's systolic blood pressure has been mostly under 120 and diastolic under 80.  She does not need all those  medications.  Due to the fact that patient already has systolic heart failure history, amlodipine  discontinued. BP remains stable.   Dysphagia: S/P PEG placement on 10/16.  Now has progressed to dysphagia 3 diet but not having enough p.o. intake to meet nutritional demands of the body and thus she is still on PEG tube feedings as well.    Chronic combined systolic/diastolic CHF: Stable, continue Entresto  and spironolactone .  Not on aspirin  due to recent hemorrhage.    Type 2 diabetes: Recent A1c of 6.6. Glucose controlled on sliding scale insulin .   Cocaine abuse: Consulted TOC for cocaine cessation resources.  DVT prophylaxis: SCD's Start: 01/11/24 2301 heparin  injection 5,000 Units Start: 01/05/24 1015 Place and maintain sequential compression device Start: 01/03/24 0611 SCDs Start: 01/03/24 0429   Code Status: Full Code  Family Communication:  None present at bedside.   Status is: Inpatient Remains inpatient appropriate because: Medically stable, pending placement.   Estimated body mass index is 19.59 kg/m as calculated from the following:   Height as of this encounter: 5' 5 (1.651 m).   Weight as of this encounter: 53.4 kg.    Nutritional Assessment: Body mass index is 19.59 kg/m.Amanda [name] [name].  I agree with the assessment and plan as outlined below: Nutrition Status: Nutrition Problem: Moderate Malnutrition Etiology: social / environmental circumstances (polysubstance abuse) Signs/Symptoms: mild muscle depletion, moderate fat depletion, mild fat depletion, moderate muscle depletion Interventions: Prostat, Tube feeding  . Skin Assessment: I have examined the patient's skin and I agree with the wound assessment as performed by the wound care RN as outlined below:    Consultants:  Neurosurgery and IR  Procedures:  As above  Antimicrobials:  Anti-infectives (From admission, onward)  Start     Dose/Rate Route Frequency Ordered Stop   01/27/24 0807   ceFAZolin (ANCEF) IVPB 2g/100 mL premix        over 30 Minutes Intravenous Continuous PRN 01/27/24 0807 01/27/24 0807   01/13/24 0600  ceFAZolin (ANCEF) IVPB 2g/100 mL premix        2 g 200 mL/hr over 30 Minutes Intravenous On call to O.R. 01/11/24 2300 01/13/24 0557         Subjective: Patient evaluated sitting in bed and eating lunch. She has no acute complaints.  Objective: Vitals:   02/04/24 2343 02/05/24 0354 02/05/24 0500 02/05/24 0807  BP: 116/72 106/77  130/74  Pulse: 65 65  70  Resp: 18 16  16   Temp: 97.6 F (36.4 C) 97.7 F (36.5 C)  97.6 F (36.4 C)  TempSrc: Oral Axillary  Oral  SpO2: 95% 94%  95%  Weight:   53.4 kg   Height:        Intake/Output Summary (Last 24 hours) at 02/05/2024 0815 Last data filed at 02/05/2024 0400 Gross per 24 hour  Intake 420 ml  Output 700 ml  Net -280 ml   Filed Weights   02/03/24 0500 02/04/24 0500 02/05/24 0500  Weight: 52 kg 51.8 kg 53.4 kg    Examination:  General: Pleasant, well-appearing woman laying in bed. No acute distress. HEENT: [place]/AT. Anicteric sclera CV: RRR. No murmurs, rubs, or gallops. No LE edema Pulmonary: Lungs CTAB. Normal effort. No wheezing or rales. Abdominal: Soft, nontender. Mild distention. Normal bowel sounds. Extremities: Palpable radial and DP pulses. Normal ROM. Skin: Warm and dry. No obvious rash or lesions. Neuro: A&Ox2. Left hemiparesis. Normal sensation to light touch.  Psych: Normal mood and affect   Data Reviewed: I have personally reviewed following labs and imaging studies  CBC: No results for input(s): WBC, NEUTROABS, HGB, HCT, MCV, PLT in the last 168 hours.  Basic Metabolic Panel: No results for input(s): NA, K, CL, CO2, GLUCOSE, BUN, CREATININE, CALCIUM, MG, PHOS in the last 168 hours.  GFR: Estimated Creatinine Clearance: 57.2 mL/min (by C-G formula based on SCr of 0.86 mg/dL). Liver Function Tests: No results for input(s): AST, ALT,  ALKPHOS, BILITOT, PROT, ALBUMIN in the last 168 hours. No results for input(s): LIPASE, AMYLASE in the last 168 hours. No results for input(s): AMMONIA in the last 168 hours. Coagulation Profile: No results for input(s): INR, PROTIME in the last 168 hours.  Cardiac Enzymes: No results for input(s): CKTOTAL, CKMB, CKMBINDEX, TROPONINI in the last 168 hours. BNP (last 3 results) No results for input(s): PROBNP in the last 8760 hours. HbA1C: No results for input(s): HGBA1C in the last 72 hours. CBG: Recent Labs  Lab 02/04/24 1233 02/04/24 2016 02/04/24 2351 02/05/24 0358 02/05/24 0806  GLUCAP 89 122* 110* 122* 126*   Lipid Profile: No results for input(s): CHOL, HDL, LDLCALC, TRIG, CHOLHDL, LDLDIRECT in the last 72 hours. Thyroid Function Tests: No results for input(s): TSH, T4TOTAL, FREET4, T3FREE, THYROIDAB in the last 72 hours. Anemia Panel: No results for input(s): VITAMINB12, FOLATE, FERRITIN, TIBC, IRON, RETICCTPCT in the last 72 hours. Sepsis Labs: No results for input(s): PROCALCITON, LATICACIDVEN in the last 168 hours.  No results found for this or any previous visit (from the past 240 hours).   Radiology Studies: No results found.  Scheduled Meds:  carvedilol   25 mg Per Tube BID WC   feeding supplement (PROSource TF20)  60 mL Per Tube Daily   free water   60  mL Per Tube Q4H   heparin  injection (subcutaneous)  5,000 Units Subcutaneous Q8H   insulin  aspart  0-9 Units Subcutaneous Q4H   mouth rinse  15 mL Mouth Rinse 4 times per day   QUEtiapine   25 mg Per Tube QHS   sacubitril -valsartan   1 tablet Per Tube BID   spironolactone   25 mg Per Tube Daily   Continuous Infusions:  feeding supplement (OSMOLITE 1.5 CAL) 1,000 mL (02/04/24 1638)     LOS: 33 days   Claretta CHRISTELLA Alderman, MD [facility]  02/05/2024, 8:15 AM   *Please note that this is a verbal dictation therefore any spelling or  grammatical errors are due to the Dragon Medical One system interpretation.  Please page via Amion and do not message via secure chat for urgent patient care matters. Secure chat can be used for non urgent patient care matters.  How to contact the [facility] Attending or Consulting provider 7A - 7P or covering provider during after hours 7P -7A, for this patient?  Check the care team in [facility] and look for a) attending/consulting TR[facility] provider listed and b) the [facility] team listed. Page or secure chat 7A-7P. Log into [email or web address] and use 's universal password to access. If you do not have the password, please contact the hospital operator. Locate the [facility] provider you are looking for under [facility] and page to a number that you can be directly reached. If you still have difficulty reaching the provider, please page the [facility] (Director on Call) for the Hospitalists listed on amion for assistance.

## 2024-02-05 NOTE — Plan of Care (Signed)
  Problem: Fluid Volume: Goal: Ability to maintain a balanced intake and output will improve Outcome: Progressing   Problem: Health Behavior/Discharge Planning: Goal: Ability to manage health-related needs will improve Outcome: Progressing   Problem: Nutritional: Goal: Maintenance of adequate nutrition will improve Outcome: Progressing   Problem: Skin Integrity: Goal: Risk for impaired skin integrity will decrease Outcome: Progressing   Problem: Clinical Measurements: Goal: Will remain free from infection Outcome: Progressing   Problem: Activity: Goal: Risk for activity intolerance will decrease Outcome: Progressing   Problem: Nutrition: Goal: Adequate nutrition will be maintained Outcome: Progressing   Problem: Coping: Goal: Level of anxiety will decrease Outcome: Progressing   Problem: Elimination: Goal: Will not experience complications related to urinary retention Outcome: Progressing   Problem: Skin Integrity: Goal: Risk for impaired skin integrity will decrease Outcome: Progressing   Problem: Intracerebral Hemorrhage Tissue Perfusion: Goal: Complications of Intracerebral Hemorrhage will be minimized Outcome: Progressing

## 2024-02-06 DIAGNOSIS — I610 Nontraumatic intracerebral hemorrhage in hemisphere, subcortical: Secondary | ICD-10-CM | POA: Diagnosis not present

## 2024-02-06 LAB — GLUCOSE, CAPILLARY
Glucose-Capillary: 100 mg/dL — ABNORMAL HIGH (ref 70–99)
Glucose-Capillary: 101 mg/dL — ABNORMAL HIGH (ref 70–99)
Glucose-Capillary: 113 mg/dL — ABNORMAL HIGH (ref 70–99)
Glucose-Capillary: 151 mg/dL — ABNORMAL HIGH (ref 70–99)
Glucose-Capillary: 152 mg/dL — ABNORMAL HIGH (ref 70–99)
Glucose-Capillary: 97 mg/dL (ref 70–99)

## 2024-02-06 MED ORDER — AQUAPHOR EX OINT
TOPICAL_OINTMENT | Freq: Two times a day (BID) | CUTANEOUS | Status: DC
  Administered 2024-02-14 – 2024-03-09 (×6): 1 via TOPICAL
  Filled 2024-02-06 (×8): qty 50

## 2024-02-06 NOTE — Progress Notes (Signed)
 PROGRESS NOTE    Amanda [name]  FMW:[id number] DOB: Aug 16, 1960 DOA: 01/03/2024 PCP: Patient, No Pcp Per   Brief Narrative:  Patient is a 63 year old female with history of hypertension, cocaine abuse, diabetes type 2 who presented with lethargy, near syncope.  Found to be severely hypertensive, UDS positive for cocaine, with acute intraparenchymal hemorrhage , intraventricular hemorrhage and hydrocephalus.  Patient was admitted to ICU for close blood pressure management.  Neurosurgery consulted.  Status post right frontal ventriculostomy. EVD eventually removed on 10/3. Hospital course remarkable for dysphagia.  Speech therapy following.  Dietitian/speech therapy recommending PEG placement because she is not able to tolerate anything by mouth.  PT/OT recommending SNF on discharge.  Underwent PEG placement by IR on 10/16. TOC following    Significant events: 9/22 Admitted and intubated, emergent burr holes and right frontal ventriculostomy placed by neurosurgery due to neurological worsening 9/23 extubated 10/1 EVD clamped 10/3 EVD removed 10/16 PEG placement by IR for worsening dysphagia  Assessment & Plan:   Principal Problem:   ICH (intracerebral hemorrhage) (HCC) Active Problems:   Hypertensive emergency   Tobacco abuse   Cocaine abuse (HCC)   History of alcohol abuse   Chronic combined systolic and diastolic heart failure (HCC)   HTN (hypertension)   Hemorrhagic stroke (HCC)   Malnutrition of moderate degree  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 Continues to receive PT. Plan for discharge to SNF.  EVD removed 01/14/2024, scalp staples removed 02/01/2024. Still pending bed offer.   Hypertension: Patient has been on amlodipine  10 mg daily, carvedilol  25 mg twice daily, Entresto .  Patient's systolic blood pressure has been mostly under 120 and diastolic under 80.  She  does not need all those medications. Due to the fact that patient already has systolic heart failure history, amlodipine  discontinued. BP remains stable.   Dysphagia: S/P PEG placement on 10/16. Now has progressed to dysphagia 3 diet but not having enough p.o. intake to meet nutritional demands of the body and thus she is still on PEG tube feedings as well.    Chronic combined systolic/diastolic CHF: Stable, continue Entresto  and spironolactone .  Not on aspirin  due to recent hemorrhage.    Type 2 diabetes: Recent A1c of 6.6. Glucose controlled on sliding scale insulin .   Cocaine abuse: Consulted TOC for cocaine cessation resources.  DVT prophylaxis: SCD's Start: 01/11/24 2301 heparin  injection 5,000 Units Start: 01/05/24 1015 Place and maintain sequential compression device Start: 01/03/24 0611 SCDs Start: 01/03/24 0429   Code Status: Full Code  Family Communication:  None present at bedside.   Status is: Inpatient Remains inpatient appropriate because: Medically stable, pending placement.   Estimated body mass index is 19.88 kg/m as calculated from the following:   Height as of this encounter: 5' 5 (1.651 m).   Weight as of this encounter: 54.2 kg.    Nutritional Assessment: Body mass index is 19.88 kg/m.[name] [name].  I agree with the assessment and plan as outlined below: Nutrition Status: Nutrition Problem: Moderate Malnutrition Etiology: social / environmental circumstances (polysubstance abuse) Signs/Symptoms: mild muscle depletion, moderate fat depletion, mild fat depletion, moderate muscle depletion Interventions: Prostat, Tube feeding  . Skin Assessment: I have examined the patient's skin and I agree with the wound assessment as performed by the wound care RN as outlined below:    Consultants:  Neurosurgery and IR  Procedures:  As above  Antimicrobials:  Anti-infectives (From admission, onward)  Start     Dose/Rate Route Frequency Ordered Stop    01/27/24 0807  ceFAZolin (ANCEF) IVPB 2g/100 mL premix        over 30 Minutes Intravenous Continuous PRN 01/27/24 0807 01/27/24 0807   01/13/24 0600  ceFAZolin (ANCEF) IVPB 2g/100 mL premix        2 g 200 mL/hr over 30 Minutes Intravenous On call to O.R. 01/11/24 2300 01/13/24 0557         Subjective: Patient evaluated laying comfortably in bed.  She has no acute complaints.  Objective: Vitals:   02/05/24 1946 02/06/24 0004 02/06/24 0347 02/06/24 0500  BP: 105/72 102/69 98/85   Pulse: 77 69 69   Resp: 18 16 18    Temp: 98.4 F (36.9 C) 98.5 F (36.9 C) 99 F (37.2 C)   TempSrc: Oral Oral Axillary   SpO2: 97% 95% 94%   Weight:    54.2 kg  Height:        Intake/Output Summary (Last 24 hours) at 02/06/2024 1123 Last data filed at 02/06/2024 0827 Gross per 24 hour  Intake 420 ml  Output 1300 ml  Net -880 ml   Filed Weights   02/04/24 0500 02/05/24 0500 02/06/24 0500  Weight: 51.8 kg 53.4 kg 54.2 kg    Examination:  General: Pleasant, weak-appearing woman  laying in bed. No acute distress. CV: RRR. No murmurs, rubs, or gallops. No LE edema Pulmonary: Anterior lung sounds clear to auscultation. Normal effort. Abdominal: Soft, nontender.  PEG tube stable in place. Normal bowel sounds. Extremities: Palpable radial and DP pulses. Normal ROM. Skin: Warm and dry. No obvious rash or lesions. Neuro: A&Ox2. Left hemiparesis. Normal sensation to light touch.  Psych: Normal mood and affect   Data Reviewed: I have personally reviewed following labs and imaging studies  CBC: No results for input(s): WBC, NEUTROABS, HGB, HCT, MCV, PLT in the last 168 hours.  Basic Metabolic Panel: No results for input(s): NA, K, CL, CO2, GLUCOSE, BUN, CREATININE, CALCIUM, MG, PHOS in the last 168 hours.  GFR: Estimated Creatinine Clearance: 58 mL/min (by C-G formula based on SCr of 0.86 mg/dL). Liver Function Tests: No results for input(s): AST, ALT,  ALKPHOS, BILITOT, PROT, ALBUMIN in the last 168 hours. No results for input(s): LIPASE, AMYLASE in the last 168 hours. No results for input(s): AMMONIA in the last 168 hours. Coagulation Profile: No results for input(s): INR, PROTIME in the last 168 hours.  Cardiac Enzymes: No results for input(s): CKTOTAL, CKMB, CKMBINDEX, TROPONINI in the last 168 hours. BNP (last 3 results) No results for input(s): PROBNP in the last 8760 hours. HbA1C: No results for input(s): HGBA1C in the last 72 hours. CBG: Recent Labs  Lab 02/05/24 1526 02/05/24 1938 02/06/24 0035 02/06/24 0304 02/06/24 0842  GLUCAP 108* 111* 101* 151* 113*   Lipid Profile: No results for input(s): CHOL, HDL, LDLCALC, TRIG, CHOLHDL, LDLDIRECT in the last 72 hours. Thyroid Function Tests: No results for input(s): TSH, T4TOTAL, FREET4, T3FREE, THYROIDAB in the last 72 hours. Anemia Panel: No results for input(s): VITAMINB12, FOLATE, FERRITIN, TIBC, IRON, RETICCTPCT in the last 72 hours. Sepsis Labs: No results for input(s): PROCALCITON, LATICACIDVEN in the last 168 hours.  No results found for this or any previous visit (from the past 240 hours).   Radiology Studies: No results found.  Scheduled Meds:  carvedilol   25 mg Per Tube BID WC   feeding supplement (PROSource TF20)  60 mL Per Tube Daily   free water   60  mL Per Tube Q4H   heparin  injection (subcutaneous)  5,000 Units Subcutaneous Q8H   insulin  aspart  0-9 Units Subcutaneous Q4H   mouth rinse  15 mL Mouth Rinse 4 times per day   QUEtiapine   25 mg Per Tube QHS   sacubitril -valsartan   1 tablet Per Tube BID   spironolactone   25 mg Per Tube Daily   Continuous Infusions:  feeding supplement (OSMOLITE 1.5 CAL) 1,000 mL (02/05/24 1622)     LOS: 34 days   Claretta CHRISTELLA Alderman, MD [facility]  02/06/2024, 11:23 AM   *Please note that this is a verbal dictation therefore any spelling or  grammatical errors are due to the Dragon Medical One system interpretation.  Please page via Amion and do not message via secure chat for urgent patient care matters. Secure chat can be used for non urgent patient care matters.  How to contact the [facility] Attending or Consulting provider 7A - 7P or covering provider during after hours 7P -7A, for this patient?  Check the care team in [facility] and look for a) attending/consulting TR[facility] provider listed and b) the [facility] team listed. Page or secure chat 7A-7P. Log into [email or web address] and use [place]'s universal password to access. If you do not have the password, please contact the hospital operator. Locate the [facility] provider you are looking for under [facility] and page to a number that you can be directly reached. If you still have difficulty reaching the provider, please page the [facility] (Director on Call) for the Hospitalists listed on amion for assistance.

## 2024-02-06 NOTE — Progress Notes (Signed)
 Patient very agitated and confused. Had another BM and was found by staff smearing it on bed. Patient continued cursing at staff while staff cleaned up patient. PEG tube required a dressing change as stool was smeared across it.

## 2024-02-06 NOTE — Progress Notes (Signed)
 Patient very agitated and confused. Speaking nonsensically. RN and NT at bedside to clean up patient after BM. Patient verbally abusive towards staff. MD notified.

## 2024-02-06 NOTE — TOC Progression Note (Signed)
 Transition of Care [facility]) - Progression Note    Patient Details  Name: CORALIE STANKE MRN: [id number] Date of Birth: [birth year]  Transition of [facility]) CM/SW Contact  Lauraine FORBES Saa, LCSWA Phone Number: 02/06/2024, 1:49 PM  Clinical Narrative:     1:49 PM CSW expanded SNF search in efforts to obtain a SNF bed offer. TOC will continue to follow.  Expected Discharge Plan: Skilled Nursing Facility Barriers to Discharge: English As A Second Language Teacher, Continued Medical Work up, Inadequate or no insurance, Facility will not accept until restraint criteria met, SNF Pending bed offer               Expected Discharge Plan and Services In-house Referral: Clinical Social Work   Post Acute Care Choice: Skilled Nursing Facility Living arrangements for the past 2 months: Single Family Home                                       Social Drivers of Health (SDOH) Interventions SDOH Screenings   Food Insecurity: No Food Insecurity (07/16/2023)   Received from [facility]  Housing: Low Risk  (07/16/2023)   Received from [facility]  Transportation Needs: No Transportation Needs (07/16/2023)   Received from [facility]  Utilities: Not At Risk (07/16/2023)   Received from [facility]  Alcohol Screen: Low Risk  (05/14/2023)  Financial Resource Strain: Low Risk  (07/16/2023)   Received from [facility]  Social Connections: Unknown (10/19/2022)   Received from [facility]  Tobacco Use: High Risk (01/26/2024)    Readmission Risk Interventions    03/26/2022   10:50 AM  Readmission Risk Prevention Plan  Transportation Screening Complete  HRI or Home Care Consult Complete  Social Work Consult for Recovery Care Planning/Counseling Complete  Palliative Care Screening Not Applicable  Medication Review Oceanographer) Referral to Pharmacy

## 2024-02-06 NOTE — Consult Note (Addendum)
 WOC team consulted for recommendations for dry peeling/cracking skin to fingers/hands.  Orders placed for Aquaphor 2 times a day and have patient wear gloves after applying (at least 30 minutes) to help hold in moisture and allow Aquaphor to penetrate skin.   Secure chat sent to primary nurse regarding above.  WOC team will not follow. RE-consult if further needs arise.   Thank you,    Powell Bar MSN, RN-BC, [employer]

## 2024-02-06 NOTE — Plan of Care (Signed)
  Problem: Coping: Goal: Ability to adjust to condition or change in health will improve Outcome: Progressing   Problem: Health Behavior/Discharge Planning: Goal: Ability to manage health-related needs will improve Outcome: Progressing   Problem: Nutritional: Goal: Maintenance of adequate nutrition will improve Outcome: Progressing   Problem: Skin Integrity: Goal: Risk for impaired skin integrity will decrease Outcome: Progressing   Problem: Clinical Measurements: Goal: Will remain free from infection Outcome: Progressing   Problem: Activity: Goal: Risk for activity intolerance will decrease Outcome: Progressing   Problem: Elimination: Goal: Will not experience complications related to bowel motility Outcome: Progressing   Problem: Safety: Goal: Ability to remain free from injury will improve Outcome: Progressing   Problem: Skin Integrity: Goal: Risk for impaired skin integrity will decrease Outcome: Progressing

## 2024-02-06 NOTE — Plan of Care (Signed)
  Problem: Education: Goal: Ability to describe self-care measures that may prevent or decrease complications (Diabetes Survival Skills Education) will improve Outcome: Not Progressing   Problem: Coping: Goal: Ability to adjust to condition or change in health will improve Outcome: Not Progressing   Problem: Fluid Volume: Goal: Ability to maintain a balanced intake and output will improve Outcome: Not Progressing   Problem: Health Behavior/Discharge Planning: Goal: Ability to identify and utilize available resources and services will improve Outcome: Not Progressing Goal: Ability to manage health-related needs will improve Outcome: Not Progressing   Problem: Metabolic: Goal: Ability to maintain appropriate glucose levels will improve Outcome: Not Progressing   Problem: Nutritional: Goal: Maintenance of adequate nutrition will improve Outcome: Not Progressing Goal: Progress toward achieving an optimal weight will improve Outcome: Not Progressing   Problem: Skin Integrity: Goal: Risk for impaired skin integrity will decrease Outcome: Not Progressing   Problem: Tissue Perfusion: Goal: Adequacy of tissue perfusion will improve Outcome: Not Progressing   Problem: Education: Goal: Knowledge of General Education information will improve Description: Including pain rating scale, medication(s)/side effects and non-pharmacologic comfort measures Outcome: Not Progressing   Problem: Health Behavior/Discharge Planning: Goal: Ability to manage health-related needs will improve Outcome: Not Progressing   Problem: Clinical Measurements: Goal: Ability to maintain clinical measurements within normal limits will improve Outcome: Not Progressing Goal: Will remain free from infection Outcome: Not Progressing Goal: Diagnostic test results will improve Outcome: Not Progressing Goal: Respiratory complications will improve Outcome: Not Progressing Goal: Cardiovascular complication will  be avoided Outcome: Not Progressing   Problem: Activity: Goal: Risk for activity intolerance will decrease Outcome: Not Progressing   Problem: Nutrition: Goal: Adequate nutrition will be maintained Outcome: Not Progressing   Problem: Coping: Goal: Level of anxiety will decrease Outcome: Not Progressing   Problem: Elimination: Goal: Will not experience complications related to bowel motility Outcome: Not Progressing Goal: Will not experience complications related to urinary retention Outcome: Not Progressing   Problem: Pain Managment: Goal: General experience of comfort will improve and/or be controlled Outcome: Not Progressing   Problem: Safety: Goal: Ability to remain free from injury will improve Outcome: Not Progressing   Problem: Skin Integrity: Goal: Risk for impaired skin integrity will decrease Outcome: Not Progressing   Problem: Education: Goal: Knowledge of disease or condition will improve Outcome: Not Progressing Goal: Knowledge of secondary prevention will improve (MUST DOCUMENT ALL) Outcome: Not Progressing Goal: Knowledge of patient specific risk factors will improve (DELETE if not current risk factor) Outcome: Not Progressing   Problem: Intracerebral Hemorrhage Tissue Perfusion: Goal: Complications of Intracerebral Hemorrhage will be minimized Outcome: Not Progressing   Problem: Coping: Goal: Will verbalize positive feelings about self Outcome: Not Progressing Goal: Will identify appropriate support needs Outcome: Not Progressing   Problem: Health Behavior/Discharge Planning: Goal: Ability to manage health-related needs will improve Outcome: Not Progressing Goal: Goals will be collaboratively established with patient/family Outcome: Not Progressing   Problem: Self-Care: Goal: Ability to participate in self-care as condition permits will improve Outcome: Not Progressing Goal: Verbalization of feelings and concerns over difficulty with  self-care will improve Outcome: Not Progressing Goal: Ability to communicate needs accurately will improve Outcome: Not Progressing   Problem: Nutrition: Goal: Risk of aspiration will decrease Outcome: Not Progressing Goal: Dietary intake will improve Outcome: Not Progressing

## 2024-02-07 DIAGNOSIS — I610 Nontraumatic intracerebral hemorrhage in hemisphere, subcortical: Secondary | ICD-10-CM | POA: Diagnosis not present

## 2024-02-07 LAB — GLUCOSE, CAPILLARY
Glucose-Capillary: 137 mg/dL — ABNORMAL HIGH (ref 70–99)
Glucose-Capillary: 136 mg/dL — ABNORMAL HIGH (ref 70–99)
Glucose-Capillary: 112 mg/dL — ABNORMAL HIGH (ref 70–99)
Glucose-Capillary: 122 mg/dL — ABNORMAL HIGH (ref 70–99)
Glucose-Capillary: 111 mg/dL — ABNORMAL HIGH (ref 70–99)
Glucose-Capillary: 99 mg/dL (ref 70–99)

## 2024-02-07 MED ORDER — QUETIAPINE FUMARATE 25 MG PO TABS
25.0000 mg | ORAL_TABLET | Freq: Every day | ORAL | Status: DC
  Administered 2024-02-07: 25 mg via ORAL
  Filled 2024-02-07: qty 1

## 2024-02-07 NOTE — Progress Notes (Signed)
 Patient ID: Amanda [name], female   DOB: 22-Jun-1960, 63 y.o.   MRN: [id number]   02/07/24 1745  What Happened  Was fall witnessed? Yes  Who witnessed fall? UNit Tour Manager (Tish and Thai)  Patients activity before fall other (comment) (bed bound, moved with lift)  Point of contact  (knees)  Was patient injured? No  Provider Notification  Provider Name/Title Pahwani  Date Provider Notified 02/07/24  Time Provider Notified 1750  Method of Notification Page  Notification Reason Fall  Provider response See new orders  Date of Provider Response 02/07/24  Time of Provider Response 1751  Follow Up  Family notified Yes - comment  Time family notified 1800  Additional tests No  Progress note created (see row info) Yes  Adult Fall Risk Assessment  Risk Factor Category (scoring not indicated)  (confused and combative)  Patient Fall Risk Level High fall risk  Adult Fall Risk Interventions  Required Bundle Interventions *See Row Information* High fall risk  Additional Interventions Reorient/diversional activities with confused patients;Use of appropriate toileting equipment (bedpan, BSC, etc.)  Fall intervention(s) refused/Patient educated regarding refusal Yellow bracelet  Screening for Fall Injury Risk (To be completed on HIGH fall risk patients) - Assessing Need for Floor Mats  Risk For Fall Injury- Criteria for Floor Mats Confusion/dementia (+NuDESC, CIWA, TBI, etc.)  Will Implement Floor Mats Yes  Pain Assessment  Pain Scale 0-10  Pain Score 0  PAINAD (Pain Assessment in Advanced Dementia)  Breathing 0  Negative Vocalization 0  Facial Expression 0  Body Language 0  Consolability 0  PAINAD Score 0  PCA/Epidural/Spinal Assessment  Respiratory Pattern Regular  Neurological  Neuro (WDL) X  Level of Consciousness Alert  Orientation Level Disoriented to place;Disoriented to time;Disoriented to situation;Oriented to person  Chief Strategy Officer  Slurred/Dysarthria  R Pupil Size (mm) 3  R Pupil Shape Round  R Pupil Reaction Brisk  L Pupil Size (mm) 3  L Pupil Shape Round  L Pupil Reaction Brisk  R Extraocular Movement 2  L Extraocular Movement 2  Motor Function/Sensation Assessment Grip;Motor response  R Hand Grip Weak  L Hand Grip Weak   R Foot Dorsiflexion Weak  L Foot Dorsiflexion Absent  RUE Motor Response Purposeful movement  RUE Sensation Full sensation  RUE Motor Strength 4  LUE Motor Response Purposeful movement  LUE Sensation Decreased  LUE Motor Strength 4  RLE Motor Response Purposeful movement  RLE Sensation Full sensation  RLE Motor Strength 4  LLE Motor Response Purposeful movement  LLE Sensation Decreased  LLE Motor Strength 4  Neuro Symptoms Fatigue  Neuro symptoms relieved by Anti-anxiety medication  Neuro Additional Assessments Glasgow Coma Scale  Glasgow Coma Scale  Eye Opening 4  Best Verbal Response (NON-intubated) 5  Best Motor Response 6  Glasgow Coma Scale Score 15  NIH Stroke Scale   Dizziness Present No  Headache Present No  Interval Other (Comment)  Level of Consciousness (1a.)    1  LOC Questions (1b. )    1  LOC Commands (1c. )    1  Best Gaze (2. )   0  Visual (3. )   1  Facial Palsy (4. )     0  Motor Arm, Left (5a. )    1  Motor Arm, Right (5b. )  1  Motor Leg, Left (6a. )   2  Motor Leg, Right (6b. )  2  Limb Ataxia (7. ) 0  Sensory (  8. )   1  Best Language (9. )   1  Dysarthria (10. ) 1  Extinction/Inattention (11.)    0  Complete NIHSS TOTAL 13  Musculoskeletal  Musculoskeletal (WDL) X  Assistive Device MaxiMove  Generalized Weakness Yes  Weight Bearing Restrictions Per Provider Order No  Musculoskeletal Details  RUE Full movement  LUE Weakness  RLE Full movement  LLE Weakness  Integumentary  Integumentary (WDL) X  Skin Color Appropriate for ethnicity  Skin Condition Dry;Flaky  Skin Integrity Intact  Ecchymosis Location Arm  Ecchymosis Location  Orientation Bilateral  Skin Turgor Non-tenting    Bed alarm rang out. Estate Manager/land Agent first into room. Stated patient had leg over railing and was trying to get up so they assisted her to rest with her knees on the floor. Patient is nonambulatory. MD and family notified.  Verdie JONETTA Collier, RN

## 2024-02-07 NOTE — Progress Notes (Signed)
 PROGRESS NOTE    Amanda [name]  FMW:[id number] DOB: 1960/12/13 DOA: 01/03/2024 PCP: Patient, No Pcp Per   Brief Narrative:  Patient is a 63 year old female with history of hypertension, cocaine abuse, diabetes type 2 who presented with lethargy, near syncope.  Found to be severely hypertensive, UDS positive for cocaine, with acute intraparenchymal hemorrhage , intraventricular hemorrhage and hydrocephalus.  Patient was admitted to ICU for close blood pressure management.  Neurosurgery consulted.  Status post right frontal ventriculostomy. EVD eventually removed on 10/3. Hospital course remarkable for dysphagia.  Speech therapy following.  Dietitian/speech therapy recommending PEG placement because she is not able to tolerate anything by mouth.  PT/OT recommending SNF on discharge.  Underwent PEG placement by IR on 10/16. TOC following    Significant events: 9/22 Admitted and intubated, emergent burr holes and right frontal ventriculostomy placed by neurosurgery due to neurological worsening 9/23 extubated 10/1 EVD clamped 10/3 EVD removed 10/16 PEG placement by IR for worsening dysphagia  Assessment & Plan:   Principal Problem:   ICH (intracerebral hemorrhage) (HCC) Active Problems:   Hypertensive emergency   Tobacco abuse   Cocaine abuse (HCC)   History of alcohol abuse   Chronic combined systolic and diastolic heart failure (HCC)   HTN (hypertension)   Hemorrhagic stroke (HCC)   Malnutrition of moderate degree  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 Continues to receive PT. Plan for discharge to SNF.  EVD removed 01/14/2024, scalp staples removed 02/01/2024. Still pending bed offer.   Hypertension: Currently on carvedilol  25 mg twice daily, Entresto .  Patient's systolic blood pressure has been mostly under 120 and diastolic under 80.  Amlodipine  discontinued due to  history of systolic CHF and blood pressure remaining at low normal.   Dysphagia: S/P PEG placement on 10/16. Now has progressed to dysphagia 3 diet but not having enough p.o. intake to meet nutritional demands of the body and thus she is still on PEG tube feedings as well.    Chronic combined systolic/diastolic CHF: Stable, continue Entresto  and spironolactone .  Not on aspirin  due to recent hemorrhage.    Type 2 diabetes: Recent A1c of 6.6. Glucose controlled on sliding scale insulin .   Cocaine abuse: Consulted TOC for cocaine cessation resources.  DVT prophylaxis: SCD's Start: 01/11/24 2301 heparin  injection 5,000 Units Start: 01/05/24 1015 Place and maintain sequential compression device Start: 01/03/24 0611 SCDs Start: 01/03/24 0429   Code Status: Full Code  Family Communication:  None present at bedside.   Status is: Inpatient Remains inpatient appropriate because: Medically stable, pending placement.   Estimated body mass index is 20.03 kg/m as calculated from the following:   Height as of this encounter: 5' 5 (1.651 m).   Weight as of this encounter: 54.6 kg.    Nutritional Assessment: Body mass index is 20.03 kg/m.[name] [name].  I agree with the assessment and plan as outlined below: Nutrition Status: Nutrition Problem: Moderate Malnutrition Etiology: social / environmental circumstances (polysubstance abuse) Signs/Symptoms: mild muscle depletion, moderate fat depletion, mild fat depletion, moderate muscle depletion Interventions: Prostat, Tube feeding  . Skin Assessment: I have examined the patient's skin and I agree with the wound assessment as performed by the wound care RN as outlined below:    Consultants:  Neurosurgery and IR  Procedures:  As above  Antimicrobials:  Anti-infectives (From admission, onward)    Start     Dose/Rate Route Frequency Ordered Stop   01/27/24  9192  ceFAZolin (ANCEF) IVPB 2g/100 mL premix        over 30 Minutes  Intravenous Continuous PRN 01/27/24 0807 01/27/24 0807   01/13/24 0600  ceFAZolin (ANCEF) IVPB 2g/100 mL premix        2 g 200 mL/hr over 30 Minutes Intravenous On call to O.R. 01/11/24 2300 01/13/24 0557         Subjective: Patient seen and examined.  She is alert, oriented to self and place.  She has no complaints.  Objective: Vitals:   02/06/24 2354 02/07/24 0400 02/07/24 0500 02/07/24 0823  BP: 99/62 114/78  119/79  Pulse: 70 71  66  Resp: 18 18  18   Temp: 97.6 F (36.4 C) 98 F (36.7 C)  97.7 F (36.5 C)  TempSrc: Axillary Oral  Oral  SpO2: 93% 95%  100%  Weight:   54.6 kg   Height:        Intake/Output Summary (Last 24 hours) at 02/07/2024 0826 Last data filed at 02/07/2024 0500 Gross per 24 hour  Intake 360 ml  Output 450 ml  Net -90 ml   Filed Weights   02/05/24 0500 02/06/24 0500 02/07/24 0500  Weight: 53.4 kg 54.2 kg 54.6 kg    Examination:  General: Pleasant, weak-appearing woman  laying in bed. No acute distress. CV: RRR. No murmurs, rubs, or gallops. No LE edema Pulmonary: Anterior lung sounds clear to auscultation. Normal effort. Abdominal: Soft, nontender.  PEG tube stable in place. Normal bowel sounds. Extremities: Palpable radial and DP pulses. Normal ROM. Skin: Warm and dry. No obvious rash or lesions. Neuro: A&Ox2. Left hemiparesis. Normal sensation to light touch.  Psych: Normal mood and affect   Data Reviewed: I have personally reviewed following labs and imaging studies  CBC: No results for input(s): WBC, NEUTROABS, HGB, HCT, MCV, PLT in the last 168 hours.  Basic Metabolic Panel: No results for input(s): NA, K, CL, CO2, GLUCOSE, BUN, CREATININE, CALCIUM, MG, PHOS in the last 168 hours.  GFR: Estimated Creatinine Clearance: 58.5 mL/min (by C-G formula based on SCr of 0.86 mg/dL). Liver Function Tests: No results for input(s): AST, ALT, ALKPHOS, BILITOT, PROT, ALBUMIN in the last 168  hours. No results for input(s): LIPASE, AMYLASE in the last 168 hours. No results for input(s): AMMONIA in the last 168 hours. Coagulation Profile: No results for input(s): INR, PROTIME in the last 168 hours.  Cardiac Enzymes: No results for input(s): CKTOTAL, CKMB, CKMBINDEX, TROPONINI in the last 168 hours. BNP (last 3 results) No results for input(s): PROBNP in the last 8760 hours. HbA1C: No results for input(s): HGBA1C in the last 72 hours. CBG: Recent Labs  Lab 02/06/24 1128 02/06/24 1932 02/06/24 2316 02/07/24 0349 02/07/24 0821  GLUCAP 97 152* 100* 137* 136*   Lipid Profile: No results for input(s): CHOL, HDL, LDLCALC, TRIG, CHOLHDL, LDLDIRECT in the last 72 hours. Thyroid Function Tests: No results for input(s): TSH, T4TOTAL, FREET4, T3FREE, THYROIDAB in the last 72 hours. Anemia Panel: No results for input(s): VITAMINB12, FOLATE, FERRITIN, TIBC, IRON, RETICCTPCT in the last 72 hours. Sepsis Labs: No results for input(s): PROCALCITON, LATICACIDVEN in the last 168 hours.  No results found for this or any previous visit (from the past 240 hours).   Radiology Studies: No results found.  Scheduled Meds:  carvedilol   25 mg Per Tube BID WC   feeding supplement (PROSource TF20)  60 mL Per Tube Daily   free water   60 mL Per Tube Q4H   heparin   injection (subcutaneous)  5,000 Units Subcutaneous Q8H   insulin  aspart  0-9 Units Subcutaneous Q4H   mineral oil-hydrophilic petrolatum    Topical BID   mouth rinse  15 mL Mouth Rinse 4 times per day   QUEtiapine   25 mg Per Tube QHS   sacubitril -valsartan   1 tablet Per Tube BID   spironolactone   25 mg Per Tube Daily   Continuous Infusions:  feeding supplement (OSMOLITE 1.5 CAL) 1,000 mL (02/06/24 1742)     LOS: 35 days   Fredia Skeeter, MD [facility]  02/07/2024, 8:26 AM   *Please note that this is a verbal dictation therefore any spelling or grammatical  errors are due to the Dragon Medical One system interpretation.  Please page via Amion and do not message via secure chat for urgent patient care matters. Secure chat can be used for non urgent patient care matters.  How to contact the [facility] Attending or Consulting provider 7A - 7P or covering provider during after hours 7P -7A, for this patient?  Check the care team in [facility] and look for a) attending/consulting [facility] provider listed and b) the [facility] team listed. Page or secure chat 7A-7P. Log into [email or web address] and use [place]'s universal password to access. If you do not have the password, please contact the hospital operator. Locate the [facility] provider you are looking for under [facility] and page to a number that you can be directly reached. If you still have difficulty reaching the provider, please page the [facility] (Director on Call) for the Hospitalists listed on amion for assistance.
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
## 2024-02-07 NOTE — Plan of Care (Signed)
 Pt is confused but calm, follows commands and redirectable. Problem: Health Behavior/Discharge Planning: Goal: Ability to manage health-related needs will improve Outcome: Progressing   Problem: Nutritional: Goal: Maintenance of adequate nutrition will improve Outcome: Progressing   Problem: Skin Integrity: Goal: Risk for impaired skin integrity will decrease Outcome: Progressing   Problem: Education: Goal: Knowledge of General Education information will improve Description: Including pain rating scale, medication(s)/side effects and non-pharmacologic comfort measures Outcome: Progressing   Problem: Health Behavior/Discharge Planning: Goal: Ability to manage health-related needs will improve Outcome: Progressing   Problem: Activity: Goal: Risk for activity intolerance will decrease Outcome: Progressing   Problem: Nutrition: Goal: Adequate nutrition will be maintained Outcome: Progressing   Problem: Coping: Goal: Level of anxiety will decrease Outcome: Progressing   Problem: Safety: Goal: Ability to remain free from injury will improve Outcome: Progressing   Problem: Skin Integrity: Goal: Risk for impaired skin integrity will decrease Outcome: Progressing

## 2024-02-08 DIAGNOSIS — I610 Nontraumatic intracerebral hemorrhage in hemisphere, subcortical: Secondary | ICD-10-CM | POA: Diagnosis not present

## 2024-02-08 LAB — GLUCOSE, CAPILLARY
Glucose-Capillary: 100 mg/dL — ABNORMAL HIGH (ref 70–99)
Glucose-Capillary: 114 mg/dL — ABNORMAL HIGH (ref 70–99)
Glucose-Capillary: 130 mg/dL — ABNORMAL HIGH (ref 70–99)
Glucose-Capillary: 146 mg/dL — ABNORMAL HIGH (ref 70–99)
Glucose-Capillary: 160 mg/dL — ABNORMAL HIGH (ref 70–99)
Glucose-Capillary: 95 mg/dL (ref 70–99)

## 2024-02-08 MED ORDER — STERILE WATER FOR INJECTION IJ SOLN
INTRAMUSCULAR | Status: AC
  Filled 2024-02-08: qty 10

## 2024-02-08 NOTE — Progress Notes (Signed)
 Speech Language Pathology Treatment: Cognitive-Linguistic  Patient Details Name: Amanda [name] MRN: [id number] DOB: 1961-03-17 Today's Date: 02/08/2024 Time: 1350-1400 SLP Time Calculation (min) (ACUTE ONLY): 10 min  Assessment / Plan / Recommendation Clinical Impression  Pt laying in bed with bilateral wrist restraint begging to be untied. SLP released restrained and repositioned pt in bed and replaced restraints. Pt unable to be redirected from attempting to climb out of bed. No safety awareness and unable to participate in simple tasks with assist due to persistence on getting out of bed and leaving. No response to cues for awareness. Pt refuses to eat or drink during session.   HPI HPI: [name] with polysubstance/cocaine abuse 3 hrs before presentation, who called EMS for AMS, lethargy and near syncope. Her condition deteriorated (unable to protect her airway and more lethargic) and got intubated. 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 HTN, CHF, DM-2.      SLP Plan  Continue with current plan of care          Recommendations                                 Continue with current plan of care     Alanee Ting, Consuelo Fitch  02/08/2024, 2:06 PM

## 2024-02-08 NOTE — Progress Notes (Addendum)
 Occupational Therapy Treatment Patient Details Name: Amanda [name] MRN: [id number] DOB: 1960/11/10 Today's Date: 02/08/2024   History of present illness 63 yo female [facility]/22 with lethargy and near-syncope s/p cocaine and opioid ingestion. 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 S/p R frontal ventriculostomy, burr holes 9/22. ETT 9/22-9/23. PMHx of cocaine abuse, CHF, CKD, chronic cough, HTN and DM2.   OT comments  Pt received in supine, alert, with RLE hanging off bed rail. Demo'd several behavioral fluctuations during session, often talkative, tangential, and irritable (but redirectable). Intermittently paranoid about seeing individuals passing in hallway. Observed pt with urinary incontinence, required total A for LB bathing/toileting and LB dressing bed level with poor initiation/engagement with OT. Max-total A to roll bilaterally for linen exchange. Behavior major inhibitor for therapy progress. Will continue to follow and progress as able.      If plan is discharge home, recommend the following:  Two people to help with walking and/or transfers;A lot of help with bathing/dressing/bathroom;Assistance with cooking/housework;Direct supervision/assist for medications management;Direct supervision/assist for financial management;Assist for transportation;Help with stairs or ramp for entrance   Equipment Recommendations  Other (comment) (defer)    Recommendations for Other Services      Precautions / Restrictions Precautions Precautions: Fall;Other (comment) Recall of Precautions/Restrictions: Impaired Precaution/Restrictions Comments: PEG, SBP < 160, pushes to L, impulsive, irritable, B wrist restraints Restrictions Weight Bearing Restrictions Per Provider Order: No       Mobility Bed Mobility Overal bed mobility: Needs Assistance Bed Mobility: Rolling Rolling: Max  assist, Total assist, +2 for physical assistance, +2 for safety/equipment, Used rails         General bed mobility comments: max A to roll to L, total A to roll to R with poor initiation/volition, used bed rail when rolling to R with max encouragement/cueing to reach for bed rail    Transfers                   General transfer comment: deferred EOB this date 2/2 pt irritability, lability, and behaviors during session     Balance                                           ADL either performed or assessed with clinical judgement   ADL Overall ADL's : Needs assistance/impaired             Lower Body Bathing: Total assistance;Bed level Lower Body Bathing Details (indicate cue type and reason): bathing peri area and inner thighs following urinary incontinence episode     Lower Body Dressing: Total assistance;+2 for safety/equipment;Bed level Lower Body Dressing Details (indicate cue type and reason): exchanging soiled brief                    Extremity/Trunk Assessment              Vision       Perception     Praxis     Communication Communication Communication: Impaired Factors Affecting Communication: Reduced clarity of speech   Cognition Arousal: Alert Behavior During Therapy: Impulsive, Agitated, Restless, Lability Cognition: Cognition impaired   Orientation impairments: Time, Situation, Place (denies being in [place] despite multiple environmental clues that she is in the hospital) Awareness: Intellectual awareness impaired Memory impairment (select all impairments): Short-term memory, Working memory Attention impairment (  select first level of impairment): Sustained attention Executive functioning impairment (select all impairments): Initiation, Sequencing, Reasoning, Problem solving OT - Cognition Comments: no safety awareness/insight; extremely tangential and with poor attention during conversation and when following  commands/executing tasks                 Following commands: Impaired Following commands impaired: Follows one step commands inconsistently, Follows one step commands with increased time      Cueing   Cueing Techniques: Verbal cues, Tactile cues, Visual cues, Gestural cues  Exercises      Shoulder Instructions       General Comments x2 wrist restraints re-applied at end of session, pt not attempting any physical combativeness during session this date    Pertinent Vitals/ Pain       Pain Assessment Pain Assessment: Faces Faces Pain Scale: No hurt  Home Living                                          Prior Functioning/Environment              Frequency  Min 1X/week        Progress Toward Goals  OT Goals(current goals can now be found in the care plan section)  Progress towards OT goals: Not progressing toward goals - comment (fluctuating progress)  Acute Rehab OT Goals Time For Goal Achievement: 02/22/24  Plan      Co-evaluation    PT/OT/SLP Co-Evaluation/Treatment: Yes Reason for Co-Treatment: Complexity of the patient's impairments (multi-system involvement);Necessary to address cognition/behavior during functional activity;For patient/therapist safety;To address functional/ADL transfers   OT goals addressed during session: ADL's and self-care (cognition)      AM-PAC OT 6 Clicks Daily Activity     Outcome Measure   Help from another person eating meals?: Total Help from another person taking care of personal grooming?: A Little Help from another person toileting, which includes using toliet, bedpan, or urinal?: Total Help from another person bathing (including washing, rinsing, drying)?: Total Help from another person to put on and taking off regular upper body clothing?: A Lot Help from another person to put on and taking off regular lower body clothing?: Total 6 Click Score: 9    End of Session    OT Visit Diagnosis:  Unsteadiness on feet (R26.81);Other abnormalities of gait and mobility (R26.89);Muscle weakness (generalized) (M62.81)   Activity Tolerance Patient tolerated treatment well   Patient Left in bed;with call bell/phone within reach;with bed alarm set;with restraints reapplied   Nurse Communication          Time: 8496-8473 OT Time Calculation (min): 23 min  Charges: OT General Charges $OT Visit: 1 Visit OT Treatments $Self Care/Home Management : 8-22 mins  Mieke Brinley D., MSOT, OTR/L Acute Rehabilitation Services [phone] Secure Chat Preferred  [name] 02/08/2024, 5:14 PM

## 2024-02-08 NOTE — Plan of Care (Signed)
  Problem: Metabolic: Goal: Ability to maintain appropriate glucose levels will improve Outcome: Progressing   Problem: Skin Integrity: Goal: Risk for impaired skin integrity will decrease Outcome: Progressing   Problem: Clinical Measurements: Goal: Respiratory complications will improve Outcome: Progressing Goal: Cardiovascular complication will be avoided Outcome: Progressing   Problem: Elimination: Goal: Will not experience complications related to urinary retention Outcome: Progressing   Problem: Intracerebral Hemorrhage Tissue Perfusion: Goal: Complications of Intracerebral Hemorrhage will be minimized Outcome: Progressing   Problem: Coping: Goal: Will identify appropriate support needs Outcome: Progressing

## 2024-02-08 NOTE — Progress Notes (Signed)
 Physical Therapy Treatment Patient Details Name: Amanda [name] MRN: [id number] DOB: December 30, 1960 Today's Date: 02/08/2024   History of Present Illness 63 yo female presents to [facility] on 9/22 with lethargy and near-syncope s/p cocaine and opioid ingestion. 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 S/p R frontal ventriculostomy, burr holes 9/22. ETT 9/22-9/23. PMHx of cocaine abuse, CHF, CKD, chronic cough, HTN and DM2.    PT Comments  Pt received in supine and linens noted to be soiled with pt stating she voided. Pt appears more confused this session and is resistant to reorientation attempts. Attempted to engage pt in mobility and hygiene tasks, but pt remains distracted and does not initiate. Pt requires max-total A to roll and resists assist when rolling R. Deferred EOB mobility for pt safety due to impulsivity, impaired cognition, and intermittent agitation. Pt continues to benefit from PT services to progress toward functional mobility goals.    If plan is discharge home, recommend the following: Two people to help with walking and/or transfers;Supervision due to cognitive status;Assist for transportation;Help with stairs or ramp for entrance;Assistance with cooking/housework;Direct supervision/assist for medications management;Direct supervision/assist for financial management   Can travel by private vehicle     No  Equipment Recommendations  Hospital bed;Wheelchair (measurements PT);Wheelchair cushion (measurements PT);Hoyer lift;BSC/3in1;Rolling walker (2 wheels)    Recommendations for Other Services       Precautions / Restrictions Precautions Precautions: Fall;Other (comment) Recall of Precautions/Restrictions: Impaired Precaution/Restrictions Comments: PEG, SBP < 160, pushes to L, impulsive, irritable Restrictions Weight Bearing Restrictions Per Provider Order: No     Mobility  Bed  Mobility Overal bed mobility: Needs Assistance Bed Mobility: Rolling Rolling: Max assist, Total assist         General bed mobility comments: rolling for pericare and linen change with max A to roll to the L and Total A to roll to the R due to pt resisting. Pt not initiating despite attempts to engage pt in tasks. Deferred EOB due to pt impaired cognition, impulsivity, and intermittent agitation    Transfers                        Ambulation/Gait                   Stairs             Wheelchair Mobility     Tilt Bed    Modified Rankin (Stroke Patients Only) Modified Rankin (Stroke Patients Only) Pre-Morbid Rankin Score: No symptoms Modified Rankin: Severe disability     Balance Overall balance assessment: Needs assistance     Sitting balance - Comments: nt this session                                    Communication Communication Communication: Impaired Factors Affecting Communication: Reduced clarity of speech  Cognition Arousal: Alert Behavior During Therapy: Impulsive, Agitated, Restless   PT - Cognitive impairments: Awareness, Attention, Safety/Judgement, Sequencing, Initiation, Problem solving, Memory, Orientation                       PT - Cognition Comments: Pt not oriented and unreceptive to attempts at reorientation. Pt perseverating on naming items she states she can obtain. Pt calling out for Jimmy into the hallway at end of session. Pt appears paranoid at one point stating  someone came in and wanted therapist to check Following commands: Impaired Following commands impaired: Follows one step commands inconsistently, Follows one step commands with increased time    Cueing Cueing Techniques: Verbal cues, Tactile cues, Visual cues, Gestural cues  Exercises      General Comments        Pertinent Vitals/Pain Pain Assessment Pain Assessment: Faces Faces Pain Scale: No hurt     PT Goals (current  goals can now be found in the care plan section) Acute Rehab PT Goals Patient Stated Goal: to go home PT Goal Formulation: With patient Time For Goal Achievement: 02/15/24 Progress towards PT goals: Not progressing toward goals - comment (limited by impaired cognition)    Frequency    Min 1X/week       AM-PAC PT 6 Clicks Mobility   Outcome Measure  Help needed turning from your back to your side while in a flat bed without using bedrails?: A Lot Help needed moving from lying on your back to sitting on the side of a flat bed without using bedrails?: A Lot Help needed moving to and from a bed to a chair (including a wheelchair)?: Total Help needed standing up from a chair using your arms (e.g., wheelchair or bedside chair)?: Total Help needed to walk in hospital room?: Total Help needed climbing 3-5 steps with a railing? : Total 6 Click Score: 8    End of Session   Activity Tolerance: Treatment limited secondary to agitation;Other (comment) (impaired cognition) Patient left: in bed;with call bell/phone within reach;with bed alarm set;with restraints reapplied Nurse Communication: Mobility status PT Visit Diagnosis: Unsteadiness on feet (R26.81);Difficulty in walking, not elsewhere classified (R26.2);Other abnormalities of gait and mobility (R26.89);Other symptoms and signs involving the nervous system (R29.898);Muscle weakness (generalized) (M62.81)     Time: 8495-8472 PT Time Calculation (min) (ACUTE ONLY): 23 min  Charges:    $Therapeutic Activity: 8-22 mins PT General Charges $$ ACUTE PT VISIT: 1 Visit                     Darryle George, PTA Acute Rehabilitation Services Secure Chat Preferred  [phone]    Darryle George 02/08/2024, 4:03 PM

## 2024-02-08 NOTE — Plan of Care (Signed)
  Problem: Education: Goal: Ability to describe self-care measures that may prevent or decrease complications (Diabetes Survival Skills Education) will improve Outcome: Progressing   Problem: Health Behavior/Discharge Planning: Goal: Ability to identify and utilize available resources and services will improve Outcome: Progressing Goal: Ability to manage health-related needs will improve Outcome: Progressing   

## 2024-02-08 NOTE — Progress Notes (Signed)
 PROGRESS NOTE    Amanda [name]  FMW:[id number] DOB: 01-04-1961 DOA: 01/03/2024 PCP: Patient, No Pcp Per   Brief Narrative:  Patient is a 63 year old female with history of hypertension, cocaine abuse, diabetes type 2 who presented with lethargy, near syncope.  Found to be severely hypertensive, UDS positive for cocaine, with acute intraparenchymal hemorrhage , intraventricular hemorrhage and hydrocephalus.  Patient was admitted to ICU for close blood pressure management.  Neurosurgery consulted.  Status post right frontal ventriculostomy. EVD eventually removed on 10/3. Hospital course remarkable for dysphagia.  Speech therapy following.  Dietitian/speech therapy recommending PEG placement because she is not able to tolerate anything by mouth.  PT/OT recommending SNF on discharge.  Underwent PEG placement by IR on 10/16. TOC following    Significant events: 9/22 Admitted and intubated, emergent burr holes and right frontal ventriculostomy placed by neurosurgery due to neurological worsening 9/23 extubated 10/1 EVD clamped 10/3 EVD removed 10/16 PEG placement by IR for worsening dysphagia  Assessment & Plan:   Principal Problem:   ICH (intracerebral hemorrhage) (HCC) Active Problems:   Hypertensive emergency   Tobacco abuse   Cocaine abuse (HCC)   History of alcohol abuse   Chronic combined systolic and diastolic heart failure (HCC)   HTN (hypertension)   Hemorrhagic stroke (HCC)   Malnutrition of moderate degree  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 Continues to receive PT. Plan for discharge to SNF.  EVD removed 01/14/2024, scalp staples removed 02/01/2024. Still pending bed offer.   Hypertension: Currently on carvedilol  25 mg twice daily, Entresto .  Patient's systolic blood pressure has been mostly under 120 and diastolic under 80.  Amlodipine  discontinued due to  history of systolic CHF and blood pressure remaining at low normal.   Dysphagia: S/P PEG placement on 10/16. Now has progressed to dysphagia 3 diet but not having enough p.o. intake to meet nutritional demands of the body and thus she is still on PEG tube feedings as well.    Chronic combined systolic/diastolic CHF: Stable, continue Entresto  and spironolactone .  Not on aspirin  due to recent hemorrhage.    Type 2 diabetes: Recent A1c of 6.6. Glucose controlled on sliding scale insulin .   Cocaine abuse: Consulted TOC for cocaine cessation resources.  Delirium/fall: Late afternoon 02/07/2024, patient was noted to be confused and agitated, trying to hit staff.  Zyprexa  as needed for agitation had been ordered but was not given when she was agitated.  I was later informed that she had a witnessed fall as well.  No reports of injury to the head or any other area.  Patient was not complaining of anything.  This happened a day prior as well.  Signs and symptoms indicative of possible delirium.  Seroquel  25 mg nightly ordered and was advised to be given around 6:30 PM however it was given at 9:34 PM.  Patient is much better today, alert and oriented to self and place as well.  I will continue Seroquel . 1. Avoid benzodiazepines, antihistamines, anticholinergics, and minimize opiate use as these may worsen delirium. 2: Assess, prevent and manage pain as lack of treatment can result in delirium.  3: Provide appropriate lighting and clear signage; a clock and calendar should be easily visible to the patient. 4: Monitor environmental factors. Reduce light and noise at night (close shades, turn off lights, turn off TV, ect). Correct any alterations in sleep cycle. 5: Reorient the patient to person, place, time and situation  on each encounter.  6: Correct sensory deficits if possible (replace eye glasses, hearing aids, ect). 7: Avoid restraints if able. Severely delirious patients benefit from constant observation  by a sitter.  DVT prophylaxis: SCD's Start: 01/11/24 2301 heparin  injection 5,000 Units Start: 01/05/24 1015 Place and maintain sequential compression device Start: 01/03/24 0611 SCDs Start: 01/03/24 0429   Code Status: Full Code  Family Communication:  None present at bedside.   Status is: Inpatient Remains inpatient appropriate because: Medically stable, pending placement.   Estimated body mass index is 20.03 kg/m as calculated from the following:   Height as of this encounter: 5' 5 (1.651 m).   Weight as of this encounter: 54.6 kg.    Nutritional Assessment: Body mass index is 20.03 kg/m.Amanda [name] [name].  I agree with the assessment and plan as outlined below: Nutrition Status: Nutrition Problem: Moderate Malnutrition Etiology: social / environmental circumstances (polysubstance abuse) Signs/Symptoms: mild muscle depletion, moderate fat depletion, mild fat depletion, moderate muscle depletion Interventions: Prostat, Tube feeding  . Skin Assessment: I have examined the patient's skin and I agree with the wound assessment as performed by the wound care RN as outlined below:    Consultants:  Neurosurgery and IR  Procedures:  As above  Antimicrobials:  Anti-infectives (From admission, onward)    Start     Dose/Rate Route Frequency Ordered Stop   01/27/24 0807  ceFAZolin (ANCEF) IVPB 2g/100 mL premix        over 30 Minutes Intravenous Continuous PRN 01/27/24 0807 01/27/24 0807   01/13/24 0600  ceFAZolin (ANCEF) IVPB 2g/100 mL premix        2 g 200 mL/hr over 30 Minutes Intravenous On call to O.R. 01/11/24 2300 01/13/24 0557         Subjective: Patient seen and examined, much more alert than yesterday, oriented to self and place.  No complaints.  Objective: Vitals:   02/07/24 1156 02/07/24 1554 02/07/24 1750 02/07/24 2016  BP: 133/67 (!) 135/52 (!) 131/120 (!) 143/88  Pulse: 76 80 87 94  Resp: 16 18 16 16   Temp: 98 F (36.7 C) 98.4 F (36.9 C) 98.5 F  (36.9 C) (!) 97.5 F (36.4 C)  TempSrc:  Oral Oral   SpO2: 94% 95% 97% 93%  Weight:      Height:        Intake/Output Summary (Last 24 hours) at 02/08/2024 9177 Last data filed at 02/08/2024 0428 Gross per 24 hour  Intake 360 ml  Output 600 ml  Net -240 ml   Filed Weights   02/05/24 0500 02/06/24 0500 02/07/24 0500  Weight: 53.4 kg 54.2 kg 54.6 kg    Examination:  General exam: Appears calm and comfortable  Respiratory system: Clear to auscultation. Respiratory effort normal. Cardiovascular system: S1 & S2 heard, RRR. No JVD, murmurs, rubs, gallops or clicks. No pedal edema. Gastrointestinal system: Abdomen is nondistended, soft and nontender. No organomegaly or masses felt. Normal bowel sounds heard. Central nervous system: Alert and oriented x 2.  Left hemiparesis. Extremities: Symmetric 5 x 5 power.   Data Reviewed: I have personally reviewed following labs and imaging studies  CBC: No results for input(s): WBC, NEUTROABS, HGB, HCT, MCV, PLT in the last 168 hours.  Basic Metabolic Panel: No results for input(s): NA, K, CL, CO2, GLUCOSE, BUN, CREATININE, CALCIUM, MG, PHOS in the last 168 hours.  GFR: Estimated Creatinine Clearance: 58.5 mL/min (by C-G formula based on SCr of 0.86 mg/dL). Liver Function Tests: No results for  input(s): AST, ALT, ALKPHOS, BILITOT, PROT, ALBUMIN in the last 168 hours. No results for input(s): LIPASE, AMYLASE in the last 168 hours. No results for input(s): AMMONIA in the last 168 hours. Coagulation Profile: No results for input(s): INR, PROTIME in the last 168 hours.  Cardiac Enzymes: No results for input(s): CKTOTAL, CKMB, CKMBINDEX, TROPONINI in the last 168 hours. BNP (last 3 results) No results for input(s): PROBNP in the last 8760 hours. HbA1C: No results for input(s): HGBA1C in the last 72 hours. CBG: Recent Labs  Lab 02/07/24 1551 02/07/24 2018  02/07/24 2311 02/08/24 0355 02/08/24 0743  GLUCAP 122* 111* 99 95 100*   Lipid Profile: No results for input(s): CHOL, HDL, LDLCALC, TRIG, CHOLHDL, LDLDIRECT in the last 72 hours. Thyroid Function Tests: No results for input(s): TSH, T4TOTAL, FREET4, T3FREE, THYROIDAB in the last 72 hours. Anemia Panel: No results for input(s): VITAMINB12, FOLATE, FERRITIN, TIBC, IRON, RETICCTPCT in the last 72 hours. Sepsis Labs: No results for input(s): PROCALCITON, LATICACIDVEN in the last 168 hours.  No results found for this or any previous visit (from the past 240 hours).   Radiology Studies: No results found.  Scheduled Meds:  carvedilol   25 mg Per Tube BID WC   feeding supplement (PROSource TF20)  60 mL Per Tube Daily   free water   60 mL Per Tube Q4H   heparin  injection (subcutaneous)  5,000 Units Subcutaneous Q8H   insulin  aspart  0-9 Units Subcutaneous Q4H   mineral oil-hydrophilic petrolatum    Topical BID   mouth rinse  15 mL Mouth Rinse 4 times per day   QUEtiapine   25 mg Per Tube QHS   QUEtiapine   25 mg Oral QHS   sacubitril -valsartan   1 tablet Per Tube BID   spironolactone   25 mg Per Tube Daily   Continuous Infusions:  feeding supplement (OSMOLITE 1.5 CAL) 1,000 mL (02/06/24 1742)     LOS: 36 days   Fredia Skeeter, MD [facility]  02/08/2024, 8:22 AM   *Please note that this is a verbal dictation therefore any spelling or grammatical errors are due to the Dragon Medical One system interpretation.  Please page via Amion and do not message via secure chat for urgent patient care matters. Secure chat can be used for non urgent patient care matters.  How to contact the [facility] Attending or Consulting provider 7A - 7P or covering provider during after hours 7P -7A, for this patient?  Check the care team in [facility] and look for a) attending/consulting [facility] provider listed and b) the [facility] team listed. Page or secure chat 7A-7P. Log into  [email or web address] and use [facility] universal password to access. If you do not have the password, please contact the hospital operator. Locate the [facility] provider you are looking for under [facility] and page to a number that you can be directly reached. If you still have difficulty reaching the provider, please page the [facility] (Director on Call) for the Hospitalists listed on amion for assistance.

## 2024-02-09 DIAGNOSIS — I610 Nontraumatic intracerebral hemorrhage in hemisphere, subcortical: Secondary | ICD-10-CM | POA: Diagnosis not present

## 2024-02-09 LAB — GLUCOSE, CAPILLARY
Glucose-Capillary: 110 mg/dL — ABNORMAL HIGH (ref 70–99)
Glucose-Capillary: 111 mg/dL — ABNORMAL HIGH (ref 70–99)
Glucose-Capillary: 125 mg/dL — ABNORMAL HIGH (ref 70–99)
Glucose-Capillary: 140 mg/dL — ABNORMAL HIGH (ref 70–99)
Glucose-Capillary: 147 mg/dL — ABNORMAL HIGH (ref 70–99)
Glucose-Capillary: 157 mg/dL — ABNORMAL HIGH (ref 70–99)

## 2024-02-09 NOTE — Plan of Care (Signed)
  Problem: Fluid Volume: Goal: Ability to maintain a balanced intake and output will improve Outcome: Progressing   Problem: Metabolic: Goal: Ability to maintain appropriate glucose levels will improve Outcome: Progressing   Problem: Nutritional: Goal: Maintenance of adequate nutrition will improve Outcome: Progressing   Problem: Tissue Perfusion: Goal: Adequacy of tissue perfusion will improve Outcome: Progressing   Problem: Clinical Measurements: Goal: Ability to maintain clinical measurements within normal limits will improve Outcome: Progressing   Problem: Pain Managment: Goal: General experience of comfort will improve and/or be controlled Outcome: Progressing

## 2024-02-09 NOTE — Progress Notes (Signed)
 PROGRESS NOTE    Amanda [name]  FMW:[id number] DOB: 07/28/1960 DOA: 01/03/2024 PCP: Patient, No Pcp Per   Brief Narrative:  Patient is a 63 year old female with history of hypertension, cocaine abuse, diabetes type 2 who presented with lethargy, near syncope.  Found to be severely hypertensive, UDS positive for cocaine, with acute intraparenchymal hemorrhage , intraventricular hemorrhage and hydrocephalus.  Patient was admitted to ICU for close blood pressure management.  Neurosurgery consulted.  Status post right frontal ventriculostomy. EVD eventually removed on 10/3. Hospital course remarkable for dysphagia.  Speech therapy following.  Dietitian/speech therapy recommending PEG placement because she is not able to tolerate anything by mouth.  PT/OT recommending SNF on discharge.  Underwent PEG placement by IR on 10/16. TOC following    Significant events: 9/22 Admitted and intubated, emergent burr holes and right frontal ventriculostomy placed by neurosurgery due to neurological worsening 9/23 extubated 10/1 EVD clamped 10/3 EVD removed 10/16 PEG placement by IR for worsening dysphagia  Assessment & Plan:   Principal Problem:   ICH (intracerebral hemorrhage) (HCC) Active Problems:   Hypertensive emergency   Tobacco abuse   Cocaine abuse (HCC)   History of alcohol abuse   Chronic combined systolic and diastolic heart failure (HCC)   HTN (hypertension)   Hemorrhagic stroke (HCC)   Malnutrition of moderate degree  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 Continues to receive PT. Plan for discharge to SNF.  EVD removed 01/14/2024, scalp staples removed 02/01/2024. Still pending bed offer.   Hypertension: Currently on carvedilol  25 mg twice daily, Entresto .  Patient's systolic blood pressure has been mostly under 120 and diastolic under 80.  Amlodipine  discontinued due to  history of systolic CHF and blood pressure remaining at low normal.   Dysphagia: S/P PEG placement on 10/16. Now has progressed to dysphagia 3 diet but not having enough p.o. intake to meet nutritional demands of the body and thus she is still on PEG tube feedings as well.    Chronic combined systolic/diastolic CHF: Stable, continue Entresto  and spironolactone .  Not on aspirin  due to recent hemorrhage.    Type 2 diabetes: Recent A1c of 6.6. Glucose controlled on sliding scale insulin .   Cocaine abuse: Consulted TOC for cocaine cessation resources.  Delirium/fall: Late afternoon 02/07/2024, patient was noted to be confused and agitated, trying to hit staff.  Zyprexa  as needed for agitation was already ordered but was not given when she was agitated.  I was later informed that she had a witnessed fall as well.  No reports of injury to the head or any other area.  Patient was not complaining of anything.  Suspected delirium.  Seroquel  ordered.  Patient was doing fine during my evaluation yesterday however yet again in the afternoon, she became confused and agitated.  Will continue Seroquel  at current dose for now. 1. Avoid benzodiazepines, antihistamines, anticholinergics, and minimize opiate use as these may worsen delirium. 2: Assess, prevent and manage pain as lack of treatment can result in delirium.  3: Provide appropriate lighting and clear signage; a clock and calendar should be easily visible to the patient. 4: Monitor environmental factors. Reduce light and noise at night (close shades, turn off lights, turn off TV, ect). Correct any alterations in sleep cycle. 5: Reorient the patient to person, place, time and situation on each encounter.  6: Correct sensory deficits if possible (replace eye glasses, hearing aids, ect). 7: Avoid restraints if able. Severely delirious  patients benefit from constant observation by a sitter.  DVT prophylaxis: SCD's Start: 01/11/24 2301 heparin  injection 5,000  Units Start: 01/05/24 1015 Place and maintain sequential compression device Start: 01/03/24 0611 SCDs Start: 01/03/24 0429   Code Status: Full Code  Family Communication:  None present at bedside.   Status is: Inpatient Remains inpatient appropriate because: Medically stable, pending placement.   Estimated body mass index is 20.03 kg/m as calculated from the following:   Height as of this encounter: 5' 5 (1.651 m).   Weight as of this encounter: 54.6 kg.    Nutritional Assessment: Body mass index is 20.03 kg/m.[name] [name].  I agree with the assessment and plan as outlined below: Nutrition Status: Nutrition Problem: Moderate Malnutrition Etiology: social / environmental circumstances (polysubstance abuse) Signs/Symptoms: mild muscle depletion, moderate fat depletion, mild fat depletion, moderate muscle depletion Interventions: Prostat, Tube feeding  . Skin Assessment: I have examined the patient's skin and I agree with the wound assessment as performed by the wound care RN as outlined below:    Consultants:  Neurosurgery and IR  Procedures:  As above  Antimicrobials:  Anti-infectives (From admission, onward)    Start     Dose/Rate Route Frequency Ordered Stop   01/27/24 0807  ceFAZolin (ANCEF) IVPB 2g/100 mL premix        over 30 Minutes Intravenous Continuous PRN 01/27/24 0807 01/27/24 0807   01/13/24 0600  ceFAZolin (ANCEF) IVPB 2g/100 mL premix        2 g 200 mL/hr over 30 Minutes Intravenous On call to O.R. 01/11/24 2300 01/13/24 0557         Subjective: Seen and examined.  Alert and oriented to self.  No complaints.  She has soft wrist restraints.  Objective: Vitals:   02/08/24 1652 02/08/24 2028 02/08/24 2338 02/09/24 0400  BP: (!) 150/89 115/80 113/86 117/83  Pulse: 92 73 72 66  Resp: 19 20 18 18   Temp: 97.6 F (36.4 C) 97.6 F (36.4 C) (!) 97.3 F (36.3 C) 98.1 F (36.7 C)  TempSrc: Axillary Axillary Axillary Axillary  SpO2: 97% 100%  100% 92%  Weight:      Height:        Intake/Output Summary (Last 24 hours) at 02/09/2024 0809 Last data filed at 02/09/2024 0500 Gross per 24 hour  Intake 1495 ml  Output 550 ml  Net 945 ml   Filed Weights   02/05/24 0500 02/06/24 0500 02/07/24 0500  Weight: 53.4 kg 54.2 kg 54.6 kg    Examination:  General exam: Appears calm and comfortable  Respiratory system: Clear to auscultation. Respiratory effort normal. Cardiovascular system: S1 & S2 heard, RRR. No JVD, murmurs, rubs, gallops or clicks. No pedal edema. Gastrointestinal system: Abdomen is nondistended, soft and nontender. No organomegaly or masses felt. Normal bowel sounds heard. Central nervous system: Alert and oriented x 2.  Left hemiparesis. Extremities: Symmetric 5 x 5 power.   Data Reviewed: I have personally reviewed following labs and imaging studies  CBC: No results for input(s): WBC, NEUTROABS, HGB, HCT, MCV, PLT in the last 168 hours.  Basic Metabolic Panel: No results for input(s): NA, K, CL, CO2, GLUCOSE, BUN, CREATININE, CALCIUM, MG, PHOS in the last 168 hours.  GFR: Estimated Creatinine Clearance: 58.5 mL/min (by C-G formula based on SCr of 0.86 mg/dL). Liver Function Tests: No results for input(s): AST, ALT, ALKPHOS, BILITOT, PROT, ALBUMIN in the last 168 hours. No results for input(s): LIPASE, AMYLASE in the last 168 hours. No  results for input(s): AMMONIA in the last 168 hours. Coagulation Profile: No results for input(s): INR, PROTIME in the last 168 hours.  Cardiac Enzymes: No results for input(s): CKTOTAL, CKMB, CKMBINDEX, TROPONINI in the last 168 hours. BNP (last 3 results) No results for input(s): PROBNP in the last 8760 hours. HbA1C: No results for input(s): HGBA1C in the last 72 hours. CBG: Recent Labs  Lab 02/08/24 1113 02/08/24 1644 02/08/24 2014 02/08/24 2353 02/09/24 0333  GLUCAP 160* 114* 130* 146* 140*    Lipid Profile: No results for input(s): CHOL, HDL, LDLCALC, TRIG, CHOLHDL, LDLDIRECT in the last 72 hours. Thyroid Function Tests: No results for input(s): TSH, T4TOTAL, FREET4, T3FREE, THYROIDAB in the last 72 hours. Anemia Panel: No results for input(s): VITAMINB12, FOLATE, FERRITIN, TIBC, IRON, RETICCTPCT in the last 72 hours. Sepsis Labs: No results for input(s): PROCALCITON, LATICACIDVEN in the last 168 hours.  No results found for this or any previous visit (from the past 240 hours).   Radiology Studies: No results found.  Scheduled Meds:  carvedilol   25 mg Per Tube BID WC   feeding supplement (PROSource TF20)  60 mL Per Tube Daily   free water   60 mL Per Tube Q4H   heparin  injection (subcutaneous)  5,000 Units Subcutaneous Q8H   insulin  aspart  0-9 Units Subcutaneous Q4H   mineral oil-hydrophilic petrolatum    Topical BID   mouth rinse  15 mL Mouth Rinse 4 times per day   QUEtiapine   25 mg Per Tube QHS   sacubitril -valsartan   1 tablet Per Tube BID   spironolactone   25 mg Per Tube Daily   Continuous Infusions:  feeding supplement (OSMOLITE 1.5 CAL) 40 mL/hr at 02/09/24 0500     LOS: 37 days   Fredia Skeeter, MD [facility]  02/09/2024, 8:09 AM   *Please note that this is a verbal dictation therefore any spelling or grammatical errors are due to the Dragon Medical One system interpretation.  Please page via Amion and do not message via secure chat for urgent patient care matters. Secure chat can be used for non urgent patient care matters.  How to contact the [facility] Attending or Consulting provider 7A - 7P or covering provider during after hours 7P -7A, for this patient?  Check the care team in [facility] and look for a) attending/consulting TR[facility] provider listed and b) the TR[facility] team listed. Page or secure chat 7A-7P. Log into [email or web address] and use [place] universal password to access. If you do not have the password, please  contact the hospital operator. Locate the [facility] provider you are looking for under [facility] and page to a number that you can be directly reached. If you still have difficulty reaching the provider, please page the [facility] (Director on Call) for the Hospitalists listed on amion for assistance.

## 2024-02-09 NOTE — Progress Notes (Signed)
 Nutrition Follow-up  DOCUMENTATION CODES:   Non-severe (moderate) malnutrition in context of social or environmental circumstances (polysubstance abuse)  INTERVENTION:  Continue tube feeding via G-tube : Osmolite 1.5 at 40 ml/h (960 ml per day) Prosource TF20 60 ml 1x/d Free water : 60mL every 4 hours Provides 1520 kcal, 80 gm protein, 731 ml free water  daily (1091mL flush + TF) Continue current diet as ordered per SLP Encourage PO intake as able  When ready to discharge, adjust to bolus feeds: Osmolite 1.5, administer 1 carton 5x/d (5 cartons total per day) Administer 1/2 carton of formula for first two feeds and then increase to a full carton Free water : 50mL before and after each feed ( 5x/d) This will provide 1775 kcal, 75g protein, and of water  per day ( free water  TF+flush)   NUTRITION DIAGNOSIS:   Moderate Malnutrition related to social / environmental circumstances (polysubstance abuse) as evidenced by mild muscle depletion, moderate fat depletion, mild fat depletion, moderate muscle depletion.   GOAL:   Patient will meet greater than or equal to 90% of their needs   MONITOR:   TF tolerance, Diet advancement, PO intake, Weight trends, Labs  REASON FOR ASSESSMENT:   Follow-up for: Consult Enteral/tube feeding initiation and management  ASSESSMENT:   Pt with hx HTN, CHF, type 2 diabetes, polysubstance abuse, and pulmonary edema. Admitted with AMS, lethargy, and deteriorating condition requiring intubation; diagnosed ICH.  9/22 - admitted, intubated, EVD placed 9/23 - extubated 9/24 - s/p cortrak placement; tip gastric  9/25 - Diet advanced to Dysphagia 2/thin; TF stopped 9/26 - back down to Full liquids, TF resumed  10/13- upgraded to DYS 3, but SLP recommends PEG 10/16 - G-tube placed in IR  Update: the patient refused to eat or drink during SLP session yesterday and persisted on getting out of bed and leaving. Visited the patient who asked  me to call a friend of hers so they can sit on the porch together. She is on wrist restraints.   Scheduled Meds:  carvedilol   25 mg Per Tube BID WC   feeding supplement (PROSource TF20)  60 mL Per Tube Daily   free water   60 mL Per Tube Q4H   heparin  injection (subcutaneous)  5,000 Units Subcutaneous Q8H   insulin  aspart  0-9 Units Subcutaneous Q4H   mineral oil-hydrophilic petrolatum    Topical BID   mouth rinse  15 mL Mouth Rinse 4 times per day   QUEtiapine   25 mg Per Tube QHS   sacubitril -valsartan   1 tablet Per Tube BID   spironolactone   25 mg Per Tube Daily   Continuous Infusions:  feeding supplement (OSMOLITE 1.5 CAL) 40 mL/hr at 02/09/24 0500   Labs:  Reviewed   I/O: +7.8 L since admit  Meal Intake: minimal  NUTRITION - FOCUSED PHYSICAL EXAM:  Flowsheet Row Most Recent Value  Orbital Region Mild depletion  Upper Arm Region Unable to assess  Thoracic and Lumbar Region Moderate depletion  Buccal Region Unable to assess  Temple Region Moderate depletion  Clavicle Bone Region Moderate depletion  Clavicle and Acromion Bone Region Moderate depletion  Scapular Bone Region Unable to assess  Dorsal Hand Mild depletion  Patellar Region Mild depletion  Anterior Thigh Region Mild depletion  Posterior Calf Region Mild depletion  Edema (RD Assessment) None  Hair Reviewed  Eyes Unable to assess  Mouth Unable to assess  Skin Reviewed  Nails Reviewed    Diet Order  DIET DYS 3 Fluid consistency: Thin  Diet effective now                   EDUCATION NEEDS:   Not appropriate for education at this time  Skin:  Skin Assessment: Skin Integrity Issues: Skin Integrity Issues:: Incisions Incisions: closed surgical incision head  Last BM:  10/27 type 6  Height:   Ht Readings from Last 1 Encounters:  01/03/24 5' 5 (1.651 m)    Weight:   Stable weight noted this admission   Ideal Body Weight:  56.8 kg  BMI:  Body mass index is 20.03  kg/m.  Estimated Nutritional Needs:   Kcal:  1500-1700  Protein:  80-90 grams  Fluid:  1.5-1.7L    Leverne Ruth, MS, RDN, LDN [place]. [facility] See AMION for contact information

## 2024-02-10 DIAGNOSIS — I610 Nontraumatic intracerebral hemorrhage in hemisphere, subcortical: Secondary | ICD-10-CM | POA: Diagnosis not present

## 2024-02-10 LAB — GLUCOSE, CAPILLARY
Glucose-Capillary: 132 mg/dL — ABNORMAL HIGH (ref 70–99)
Glucose-Capillary: 119 mg/dL — ABNORMAL HIGH (ref 70–99)
Glucose-Capillary: 120 mg/dL — ABNORMAL HIGH (ref 70–99)
Glucose-Capillary: 128 mg/dL — ABNORMAL HIGH (ref 70–99)
Glucose-Capillary: 124 mg/dL — ABNORMAL HIGH (ref 70–99)

## 2024-02-10 NOTE — Progress Notes (Signed)
 PROGRESS NOTE    Amanda [name]  FMW:[id number] DOB: 02-01-1961 DOA: 01/03/2024 PCP: Patient, No Pcp Per   Brief Narrative:  Patient is a 63 year old female with history of hypertension, cocaine abuse, diabetes type 2 who presented with lethargy, near syncope.  Found to be severely hypertensive, UDS positive for cocaine, with acute intraparenchymal hemorrhage , intraventricular hemorrhage and hydrocephalus.  Patient was admitted to ICU for close blood pressure management.  Neurosurgery consulted.  Status post right frontal ventriculostomy. EVD eventually removed on 10/3. Hospital course remarkable for dysphagia.  Speech therapy following.  Dietitian/speech therapy recommending PEG placement because she is not able to tolerate anything by mouth.  PT/OT recommending SNF on discharge.  Underwent PEG placement by IR on 10/16. TOC following    Significant events: 9/22 Admitted and intubated, emergent burr holes and right frontal ventriculostomy placed by neurosurgery due to neurological worsening 9/23 extubated 10/1 EVD clamped 10/3 EVD removed 10/16 PEG placement by IR for worsening dysphagia  Assessment & Plan:   Principal Problem:   ICH (intracerebral hemorrhage) (HCC) Active Problems:   Hypertensive emergency   Tobacco abuse   Cocaine abuse (HCC)   History of alcohol abuse   Chronic combined systolic and diastolic heart failure (HCC)   HTN (hypertension)   Hemorrhagic stroke (HCC)   Malnutrition of moderate degree  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 Continues to receive PT. Plan for discharge to SNF.  EVD removed 01/14/2024, scalp staples removed 02/01/2024. Still pending bed offer.   Hypertension: Currently on carvedilol  25 mg twice daily, Entresto .  Patient's systolic blood pressure has been mostly under 120 and diastolic under 80.  Amlodipine  discontinued due to  history of systolic CHF and blood pressure remaining at low normal.   Dysphagia: S/P PEG placement on 10/16. Now has progressed to dysphagia 3 diet but not having enough p.o. intake to meet nutritional demands of the body and thus she is still on PEG tube feedings as well.    Chronic combined systolic/diastolic CHF: Stable, continue Entresto  and spironolactone .  Not on aspirin  due to recent hemorrhage.    Type 2 diabetes: Recent A1c of 6.6. Glucose controlled on sliding scale insulin .   Cocaine abuse: Consulted TOC for cocaine cessation resources.  Delirium/fall: Late afternoon 02/07/2024, patient was noted to be confused and agitated, trying to hit staff. I was later informed that she had a witnessed fall as well.  No reports of injury to the head or any other area. Suspected delirium.  Seroquel  ordered.  Patient has been doing well in the morning however has been having agitation/delirium especially in the late afternoon.  Continue nightly Seroquel  and soft wrist restraints. 1. Avoid benzodiazepines, antihistamines, anticholinergics, and minimize opiate use as these may worsen delirium. 2: Assess, prevent and manage pain as lack of treatment can result in delirium.  3: Provide appropriate lighting and clear signage; a clock and calendar should be easily visible to the patient. 4: Monitor environmental factors. Reduce light and noise at night (close shades, turn off lights, turn off TV, ect). Correct any alterations in sleep cycle. 5: Reorient the patient to person, place, time and situation on each encounter.  6: Correct sensory deficits if possible (replace eye glasses, hearing aids, ect). 7: Avoid restraints if able. Severely delirious patients benefit from constant observation by a sitter.  DVT prophylaxis: SCD's Start: 01/11/24 2301 heparin  injection 5,000 Units Start: 01/05/24 1015 Place and maintain sequential compression device  Start: 01/03/24 0611 SCDs Start: 01/03/24 0429   Code  Status: Full Code  Family Communication:  None present at bedside.   Status is: Inpatient Remains inpatient appropriate because: Medically stable, pending placement.   Estimated body mass index is 20.03 kg/m as calculated from the following:   Height as of this encounter: 5' 5 (1.651 m).   Weight as of this encounter: 54.6 kg.    Nutritional Assessment: Body mass index is 20.03 kg/m.[name] [name].  I agree with the assessment and plan as outlined below: Nutrition Status: Nutrition Problem: Moderate Malnutrition Etiology: social / environmental circumstances (polysubstance abuse) Signs/Symptoms: mild muscle depletion, moderate fat depletion, mild fat depletion, moderate muscle depletion Interventions: Prostat, Tube feeding  . Skin Assessment: I have examined the patient's skin and I agree with the wound assessment as performed by the wound care RN as outlined below:    Consultants:  Neurosurgery and IR  Procedures:  As above  Antimicrobials:  Anti-infectives (From admission, onward)    Start     Dose/Rate Route Frequency Ordered Stop   01/27/24 0807  ceFAZolin (ANCEF) IVPB 2g/100 mL premix        over 30 Minutes Intravenous Continuous PRN 01/27/24 0807 01/27/24 0807   01/13/24 0600  ceFAZolin (ANCEF) IVPB 2g/100 mL premix        2 g 200 mL/hr over 30 Minutes Intravenous On call to O.R. 01/11/24 2300 01/13/24 0557         Subjective: Patient seen and examined.  No complaints.  She is alert and more oriented today.  She was able to tell me the month/October today for the first time.  Objective: Vitals:   02/09/24 1631 02/09/24 1951 02/10/24 0005 02/10/24 0609  BP: (!) 148/96 118/81 (!) 138/90 109/86  Pulse: 97 83 75 73  Resp:  15 19 15   Temp:  (!) 97.5 F (36.4 C) 97.9 F (36.6 C) 97.8 F (36.6 C)  TempSrc:  Oral Axillary Axillary  SpO2:  100% 92% 94%  Weight:      Height:        Intake/Output Summary (Last 24 hours) at 02/10/2024 0744 Last data  filed at 02/10/2024 0631 Gross per 24 hour  Intake 1380.67 ml  Output 1200 ml  Net 180.67 ml   Filed Weights   02/05/24 0500 02/06/24 0500 02/07/24 0500  Weight: 53.4 kg 54.2 kg 54.6 kg    Examination:  General exam: Appears calm and comfortable  Respiratory system: Clear to auscultation. Respiratory effort normal. Cardiovascular system: S1 & S2 heard, RRR. No JVD, murmurs, rubs, gallops or clicks. No pedal edema. Gastrointestinal system: Abdomen is nondistended, soft and nontender. No organomegaly or masses felt. Normal bowel sounds heard. Central nervous system: Alert and oriented x 2.  Left hemiparesis. Extremities: Symmetric 5 x 5 power.   Data Reviewed: I have personally reviewed following labs and imaging studies  CBC: No results for input(s): WBC, NEUTROABS, HGB, HCT, MCV, PLT in the last 168 hours.  Basic Metabolic Panel: No results for input(s): NA, K, CL, CO2, GLUCOSE, BUN, CREATININE, CALCIUM, MG, PHOS in the last 168 hours.  GFR: Estimated Creatinine Clearance: 58.5 mL/min (by C-G formula based on SCr of 0.86 mg/dL). Liver Function Tests: No results for input(s): AST, ALT, ALKPHOS, BILITOT, PROT, ALBUMIN in the last 168 hours. No results for input(s): LIPASE, AMYLASE in the last 168 hours. No results for input(s): AMMONIA in the last 168 hours. Coagulation Profile: No results for input(s): INR, PROTIME in the  last 168 hours.  Cardiac Enzymes: No results for input(s): CKTOTAL, CKMB, CKMBINDEX, TROPONINI in the last 168 hours. BNP (last 3 results) No results for input(s): PROBNP in the last 8760 hours. HbA1C: No results for input(s): HGBA1C in the last 72 hours. CBG: Recent Labs  Lab 02/09/24 1151 02/09/24 1549 02/09/24 2001 02/09/24 2333 02/10/24 0402  GLUCAP 147* 111* 110* 125* 132*   Lipid Profile: No results for input(s): CHOL, HDL, LDLCALC, TRIG, CHOLHDL, LDLDIRECT in  the last 72 hours. Thyroid Function Tests: No results for input(s): TSH, T4TOTAL, FREET4, T3FREE, THYROIDAB in the last 72 hours. Anemia Panel: No results for input(s): VITAMINB12, FOLATE, FERRITIN, TIBC, IRON, RETICCTPCT in the last 72 hours. Sepsis Labs: No results for input(s): PROCALCITON, LATICACIDVEN in the last 168 hours.  No results found for this or any previous visit (from the past 240 hours).   Radiology Studies: No results found.  Scheduled Meds:  carvedilol   25 mg Per Tube BID WC   feeding supplement (PROSource TF20)  60 mL Per Tube Daily   free water   60 mL Per Tube Q4H   heparin  injection (subcutaneous)  5,000 Units Subcutaneous Q8H   insulin  aspart  0-9 Units Subcutaneous Q4H   mineral oil-hydrophilic petrolatum    Topical BID   mouth rinse  15 mL Mouth Rinse 4 times per day   QUEtiapine   25 mg Per Tube QHS   sacubitril -valsartan   1 tablet Per Tube BID   spironolactone   25 mg Per Tube Daily   Continuous Infusions:  feeding supplement (OSMOLITE 1.5 CAL) 1,000 mL (02/10/24 0531)     LOS: 38 days   Fredia Skeeter, MD [facility]  02/10/2024, 7:44 AM   *Please note that this is a verbal dictation therefore any spelling or grammatical errors are due to the Dragon Medical One system interpretation.  Please page via Amion and do not message via secure chat for urgent patient care matters. Secure chat can be used for non urgent patient care matters.  How to contact the [facility] Attending or Consulting provider 7A - 7P or covering provider during after hours 7P -7A, for this patient?  Check the care team in [facility] and look for a) attending/consulting TR[facility] provider listed and b) the [facility] team listed. Page or secure chat 7A-7P. Log into [email or web address] and use [place] universal password to access. If you do not have the password, please contact the hospital operator. Locate the [facility] provider you are looking for under [facility] and page  to a number that you can be directly reached. If you still have difficulty reaching the provider, please page the [facility] (Director on Call) for the Hospitalists listed on amion for assistance.

## 2024-02-10 NOTE — Plan of Care (Signed)
   Problem: Coping: Goal: Ability to adjust to condition or change in health will improve Outcome: Progressing   Problem: Metabolic: Goal: Ability to maintain appropriate glucose levels will improve Outcome: Progressing   Problem: Skin Integrity: Goal: Risk for impaired skin integrity will decrease Outcome: Progressing

## 2024-02-10 NOTE — Progress Notes (Signed)
 [facility] night cross cover note:   Straight cath x 1 occurrence now ordered for bladder scan of 492 cc.   Eva Pore, DO Hospitalist

## 2024-02-11 DIAGNOSIS — I610 Nontraumatic intracerebral hemorrhage in hemisphere, subcortical: Secondary | ICD-10-CM | POA: Diagnosis not present

## 2024-02-11 LAB — GLUCOSE, CAPILLARY
Glucose-Capillary: 104 mg/dL — ABNORMAL HIGH (ref 70–99)
Glucose-Capillary: 136 mg/dL — ABNORMAL HIGH (ref 70–99)
Glucose-Capillary: 139 mg/dL — ABNORMAL HIGH (ref 70–99)
Glucose-Capillary: 145 mg/dL — ABNORMAL HIGH (ref 70–99)
Glucose-Capillary: 154 mg/dL — ABNORMAL HIGH (ref 70–99)
Glucose-Capillary: 159 mg/dL — ABNORMAL HIGH (ref 70–99)

## 2024-02-11 MED ORDER — SACUBITRIL-VALSARTAN 97-103 MG PO TABS
1.0000 | ORAL_TABLET | Freq: Two times a day (BID) | ORAL | Status: DC
  Administered 2024-02-11 – 2024-04-19 (×126): 1 via ORAL
  Filled 2024-02-11 (×125): qty 1

## 2024-02-11 MED ORDER — QUETIAPINE FUMARATE 25 MG PO TABS
25.0000 mg | ORAL_TABLET | Freq: Every day | ORAL | Status: DC
  Administered 2024-02-11 – 2024-03-14 (×31): 25 mg via ORAL
  Filled 2024-02-11 (×35): qty 1

## 2024-02-11 MED ORDER — SPIRONOLACTONE 25 MG PO TABS
25.0000 mg | ORAL_TABLET | Freq: Every day | ORAL | Status: DC
  Administered 2024-02-12 – 2024-04-19 (×65): 25 mg via ORAL
  Filled 2024-02-11 (×61): qty 1

## 2024-02-11 MED ORDER — POLYETHYLENE GLYCOL 3350 17 G PO PACK: 17.0000 g | PACK | Freq: Every day | ORAL | Status: DC | PRN

## 2024-02-11 MED ORDER — CARVEDILOL 12.5 MG PO TABS
25.0000 mg | ORAL_TABLET | Freq: Two times a day (BID) | ORAL | Status: DC
  Administered 2024-02-12 – 2024-04-19 (×127): 25 mg via ORAL
  Filled 2024-02-11 (×119): qty 2

## 2024-02-11 NOTE — Plan of Care (Signed)
   Problem: Education: Goal: Ability to describe self-care measures that may prevent or decrease complications (Diabetes Survival Skills Education) will improve Outcome: Progressing

## 2024-02-11 NOTE — Progress Notes (Signed)
 Physical Therapy Treatment Patient Details Name: Amanda [name] MRN: [id number] DOB: September 21, 1960 Today's Date: 02/11/2024   History of Present Illness 63 yo female presents to [facility] on 9/22 with lethargy and near-syncope s/p cocaine and opioid ingestion. 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 S/p R frontal ventriculostomy, burr holes 9/22. ETT 9/22-9/23. PMHx of cocaine abuse, CHF, CKD, chronic cough, HTN and DM2.    PT Comments  Pt received in supine and agreeable to session. Pt continues to demonstrate impaired cognition and awareness requiring redirection and cues for safety throughout session. Pt able to tolerate sitting EOB for extended time, but demonstrates posterior and L lateral leans onto L elbow with increased fatigue. Pt becomes agitated with assist although pt requires consistent assist to prevent LOB. Pt able to apply lotion to BLE with close CGA due to increased trunk flexion to reach lower legs. Pt states twice I want to walk, but declines attempts to stand. Pt unable to follow cues to return to supine and demonstrates increased agitation, ultimately requiring total A +2 to return to supine. Pt continues to benefit from PT services to progress toward functional mobility goals.    If plan is discharge home, recommend the following: Two people to help with walking and/or transfers;Supervision due to cognitive status;Assist for transportation;Help with stairs or ramp for entrance;Assistance with cooking/housework;Direct supervision/assist for medications management;Direct supervision/assist for financial management   Can travel by private vehicle     No  Equipment Recommendations  Hospital bed;Wheelchair (measurements PT);Wheelchair cushion (measurements PT);Hoyer lift;BSC/3in1;Rolling walker (2 wheels)    Recommendations for Other Services       Precautions / Restrictions  Precautions Precautions: Fall;Other (comment) Recall of Precautions/Restrictions: Impaired Precaution/Restrictions Comments: PEG, SBP < 160, pushes to L, impulsive, irritable, B wrist restraints Restrictions Weight Bearing Restrictions Per Provider Order: No     Mobility  Bed Mobility Overal bed mobility: Needs Assistance Bed Mobility: Supine to Sit, Sit to Supine     Supine to sit: Mod assist Sit to supine: Total assist, +2 for physical assistance   General bed mobility comments: increased cues and mod A for trunk elevation. increased assist to return to supine due to no initiation or command following and increased agitation    Transfers                   General transfer comment: Pt stating i want to walk but declining transfer attempt    Ambulation/Gait                   Stairs             Wheelchair Mobility     Tilt Bed    Modified Rankin (Stroke Patients Only) Modified Rankin (Stroke Patients Only) Pre-Morbid Rankin Score: No symptoms Modified Rankin: Severe disability     Balance Overall balance assessment: Needs assistance Sitting-balance support: Feet supported, Single extremity supported, Bilateral upper extremity supported Sitting balance-Leahy Scale: Poor Sitting balance - Comments: L lateral and posterior leans throughout requiring consistent assist with brief periods of CGA. Pt with decreased awareness and not following cues for correction       Standing balance comment: nt                            Communication Communication Communication: Impaired Factors Affecting Communication: Reduced clarity of speech  Cognition Arousal: Alert Behavior During Therapy: Impulsive, Agitated, Restless,  Lability   PT - Cognitive impairments: Awareness, Attention, Safety/Judgement, Sequencing, Initiation, Problem solving, Memory, Orientation                         Following commands: Impaired Following  commands impaired: Follows one step commands inconsistently, Follows one step commands with increased time    Cueing Cueing Techniques: Verbal cues, Tactile cues, Visual cues, Gestural cues  Exercises      General Comments        Pertinent Vitals/Pain Pain Assessment Pain Assessment: No/denies pain     PT Goals (current goals can now be found in the care plan section) Acute Rehab PT Goals Patient Stated Goal: to go home PT Goal Formulation: With patient Time For Goal Achievement: 02/15/24 Progress towards PT goals: Not progressing toward goals - comment (impaired cognition)    Frequency    Min 1X/week      PT Plan      Co-evaluation PT/OT/SLP Co-Evaluation/Treatment: Yes Reason for Co-Treatment: Complexity of the patient's impairments (multi-system involvement);Necessary to address cognition/behavior during functional activity;For patient/therapist safety;To address functional/ADL transfers PT goals addressed during session: Mobility/safety with mobility;Balance;Strengthening/ROM        AM-PAC PT 6 Clicks Mobility   Outcome Measure  Help needed turning from your back to your side while in a flat bed without using bedrails?: A Lot Help needed moving from lying on your back to sitting on the side of a flat bed without using bedrails?: A Lot Help needed moving to and from a bed to a chair (including a wheelchair)?: Total Help needed standing up from a chair using your arms (e.g., wheelchair or bedside chair)?: Total Help needed to walk in hospital room?: Total Help needed climbing 3-5 steps with a railing? : Total 6 Click Score: 8    End of Session   Activity Tolerance: Treatment limited secondary to agitation;Other (comment) (impaired cognition) Patient left: in bed;with call bell/phone within reach;with bed alarm set;with nursing/sitter in room Nurse Communication: Mobility status PT Visit Diagnosis: Unsteadiness on feet (R26.81);Difficulty in walking, not  elsewhere classified (R26.2);Other abnormalities of gait and mobility (R26.89);Other symptoms and signs involving the nervous system (R29.898);Muscle weakness (generalized) (M62.81)     Time: 8991-8964 PT Time Calculation (min) (ACUTE ONLY): 27 min  Charges:    $Therapeutic Activity: 8-22 mins PT General Charges $$ ACUTE PT VISIT: 1 Visit                     Darryle George, PTA Acute Rehabilitation Services Secure Chat Preferred  [phone]    Darryle George 02/11/2024, 12:46 PM

## 2024-02-11 NOTE — Progress Notes (Signed)
 PROGRESS NOTE    Amanda [name]  FMW:[id number] DOB: 12/24/1960 DOA: 01/03/2024 PCP: Patient, No Pcp Per   Brief Narrative:  Patient is a 63 year old female with history of hypertension, cocaine abuse, diabetes type 2 who presented with lethargy, near syncope.  Found to be severely hypertensive, UDS positive for cocaine, with acute intraparenchymal hemorrhage , intraventricular hemorrhage and hydrocephalus.  Patient was admitted to ICU for close blood pressure management.  Neurosurgery consulted.  Status post right frontal ventriculostomy. EVD eventually removed on 10/3. Hospital course remarkable for dysphagia.  Speech therapy following.  Dietitian/speech therapy recommending PEG placement because she is not able to tolerate anything by mouth.  PT/OT recommending SNF on discharge.  Underwent PEG placement by IR on 10/16. TOC following    Significant events: 9/22 Admitted and intubated, emergent burr holes and right frontal ventriculostomy placed by neurosurgery due to neurological worsening 9/23 extubated 10/1 EVD clamped 10/3 EVD removed 10/16 PEG placement by IR for worsening dysphagia  Assessment & Plan:   Principal Problem:   ICH (intracerebral hemorrhage) (HCC) Active Problems:   Hypertensive emergency   Tobacco abuse   Cocaine abuse (HCC)   History of alcohol abuse   Chronic combined systolic and diastolic heart failure (HCC)   HTN (hypertension)   Hemorrhagic stroke (HCC)   Malnutrition of moderate degree  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 Continues to receive PT. Plan for discharge to SNF.  EVD removed 01/14/2024, scalp staples removed 02/01/2024. Still pending bed offer.   Hypertension: Currently on carvedilol  25 mg twice daily, Entresto .  Patient's systolic blood pressure has been mostly under 120 and diastolic under 80.  Amlodipine  discontinued due to  history of systolic CHF and blood pressure remaining at low normal.   Dysphagia: S/P PEG placement on 10/16. Now has progressed to dysphagia 3 diet but not having enough p.o. intake to meet nutritional needs of the body and thus she is still on PEG tube feedings as well.    Chronic combined systolic/diastolic CHF: Stable, continue Entresto  and spironolactone .  Not on aspirin  due to recent hemorrhage.    Type 2 diabetes: Recent A1c of 6.6. Glucose controlled on sliding scale insulin .   Cocaine abuse: Consulted TOC for cocaine cessation resources.  Delirium/fall: Late afternoon 02/07/2024, patient was noted to be confused and agitated, trying to hit staff. I was later informed that she had a witnessed fall as well.  No reports of injury to the head or any other area. Suspected delirium.  Seroquel  ordered.  Patient has been doing well in the morning however has been having agitation/delirium especially in the late afternoon.  However no reports of agitation last evening.  Continue Seroquel . 1. Avoid benzodiazepines, antihistamines, anticholinergics, and minimize opiate use as these may worsen delirium. 2: Assess, prevent and manage pain as lack of treatment can result in delirium.  3: Provide appropriate lighting and clear signage; a clock and calendar should be easily visible to the patient. 4: Monitor environmental factors. Reduce light and noise at night (close shades, turn off lights, turn off TV, ect). Correct any alterations in sleep cycle. 5: Reorient the patient to person, place, time and situation on each encounter.  6: Correct sensory deficits if possible (replace eye glasses, hearing aids, ect). 7: Avoid restraints if able. Severely delirious patients benefit from constant observation by a sitter.  DVT prophylaxis: SCD's Start: 01/11/24 2301 heparin  injection 5,000 Units Start: 01/05/24 1015 Place and maintain  sequential compression device Start: 01/03/24 0611 SCDs Start: 01/03/24  0429   Code Status: Full Code  Family Communication:  None present at bedside.   Status is: Inpatient Remains inpatient appropriate because: Medically stable, pending placement.   Estimated body mass index is 19.48 kg/m as calculated from the following:   Height as of this encounter: 5' 5 (1.651 m).   Weight as of this encounter: 53.1 kg.    Nutritional Assessment: Body mass index is 19.48 kg/m.Amanda [name] [name].  I agree with the assessment and plan as outlined below: Nutrition Status: Nutrition Problem: Moderate Malnutrition Etiology: social / environmental circumstances (polysubstance abuse) Signs/Symptoms: mild muscle depletion, moderate fat depletion, mild fat depletion, moderate muscle depletion Interventions: Prostat, Tube feeding  . Skin Assessment: I have examined the patient's skin and I agree with the wound assessment as performed by the wound care RN as outlined below:    Consultants:  Neurosurgery and IR  Procedures:  As above  Antimicrobials:  Anti-infectives (From admission, onward)    Start     Dose/Rate Route Frequency Ordered Stop   01/27/24 0807  ceFAZolin (ANCEF) IVPB 2g/100 mL premix        over 30 Minutes Intravenous Continuous PRN 01/27/24 0807 01/27/24 0807   01/13/24 0600  ceFAZolin (ANCEF) IVPB 2g/100 mL premix        2 g 200 mL/hr over 30 Minutes Intravenous On call to O.R. 01/11/24 2300 01/13/24 0557         Subjective: Seen and examined.  Alert and oriented to self and place.  Was not in restraints this morning.  Easily redirectable.  No complaints.  Objective: Vitals:   02/11/24 0039 02/11/24 0452 02/11/24 0609 02/11/24 0809  BP: 112/71 105/86  111/81  Pulse: 74 73  82  Resp: 18 18  18   Temp: (!) 97.4 F (36.3 C) (!) 97.5 F (36.4 C)  98.6 F (37 C)  TempSrc: Axillary Axillary  Oral  SpO2: 100% 95%  93%  Weight:   53.1 kg   Height:        Intake/Output Summary (Last 24 hours) at 02/11/2024 0822 Last data filed at  02/11/2024 9487 Gross per 24 hour  Intake 1159.33 ml  Output --  Net 1159.33 ml   Filed Weights   02/06/24 0500 02/07/24 0500 02/11/24 0609  Weight: 54.2 kg 54.6 kg 53.1 kg    Examination:  General exam: Appears calm and comfortable  Respiratory system: Clear to auscultation. Respiratory effort normal. Cardiovascular system: S1 & S2 heard, RRR. No JVD, murmurs, rubs, gallops or clicks. No pedal edema. Gastrointestinal system: Abdomen is nondistended, soft and nontender. No organomegaly or masses felt. Normal bowel sounds heard. Central nervous system: Alert and oriented x 2.  Left hemiparesis. Extremities: Symmetric 5 x 5 power.   Data Reviewed: I have personally reviewed following labs and imaging studies  CBC: No results for input(s): WBC, NEUTROABS, HGB, HCT, MCV, PLT in the last 168 hours.  Basic Metabolic Panel: No results for input(s): NA, K, CL, CO2, GLUCOSE, BUN, CREATININE, CALCIUM, MG, PHOS in the last 168 hours.  GFR: Estimated Creatinine Clearance: 56.9 mL/min (by C-G formula based on SCr of 0.86 mg/dL). Liver Function Tests: No results for input(s): AST, ALT, ALKPHOS, BILITOT, PROT, ALBUMIN in the last 168 hours. No results for input(s): LIPASE, AMYLASE in the last 168 hours. No results for input(s): AMMONIA in the last 168 hours. Coagulation Profile: No results for input(s): INR, PROTIME in the last 168 hours.  Cardiac Enzymes: No results for input(s): CKTOTAL, CKMB, CKMBINDEX, TROPONINI in the last 168 hours. BNP (last 3 results) No results for input(s): PROBNP in the last 8760 hours. HbA1C: No results for input(s): HGBA1C in the last 72 hours. CBG: Recent Labs  Lab 02/10/24 1541 02/10/24 2025 02/11/24 0020 02/11/24 0425 02/11/24 0809  GLUCAP 128* 124* 139* 159* 136*   Lipid Profile: No results for input(s): CHOL, HDL, LDLCALC, TRIG, CHOLHDL, LDLDIRECT in the last 72  hours. Thyroid Function Tests: No results for input(s): TSH, T4TOTAL, FREET4, T3FREE, THYROIDAB in the last 72 hours. Anemia Panel: No results for input(s): VITAMINB12, FOLATE, FERRITIN, TIBC, IRON, RETICCTPCT in the last 72 hours. Sepsis Labs: No results for input(s): PROCALCITON, LATICACIDVEN in the last 168 hours.  No results found for this or any previous visit (from the past 240 hours).   Radiology Studies: No results found.  Scheduled Meds:  carvedilol   25 mg Per Tube BID WC   feeding supplement (PROSource TF20)  60 mL Per Tube Daily   free water   60 mL Per Tube Q4H   heparin  injection (subcutaneous)  5,000 Units Subcutaneous Q8H   insulin  aspart  0-9 Units Subcutaneous Q4H   mineral oil-hydrophilic petrolatum    Topical BID   mouth rinse  15 mL Mouth Rinse 4 times per day   QUEtiapine   25 mg Per Tube QHS   sacubitril -valsartan   1 tablet Per Tube BID   spironolactone   25 mg Per Tube Daily   Continuous Infusions:  feeding supplement (OSMOLITE 1.5 CAL) 1,000 mL (02/11/24 0650)     LOS: 39 days   Fredia Skeeter, MD [facility]  02/11/2024, 8:22 AM   *Please note that this is a verbal dictation therefore any spelling or grammatical errors are due to the Dragon Medical One system interpretation.  Please page via Amion and do not message via secure chat for urgent patient care matters. Secure chat can be used for non urgent patient care matters.  How to contact the [facility] Attending or Consulting provider 7A - 7P or covering provider during after hours 7P -7A, for this patient?  Check the care team in [facility] and look for a) attending/consulting [facility] provider listed and b) the [facility] team listed. Page or secure chat 7A-7P. Log into [email or web address] and use [place]'s universal password to access. If you do not have the password, please contact the hospital operator. Locate the [facility] provider you are looking for under [facility] and page to a number  that you can be directly reached. If you still have difficulty reaching the provider, please page the [facility] (Director on Call) for the Hospitalists listed on amion for assistance.

## 2024-02-11 NOTE — TOC Progression Note (Signed)
 Transition of Care [facility]) - Progression Note    Patient Details  Name: Amanda [name] MRN: [id number] Date of Birth: 15-Feb-1961  Transition of Care [facility] CM/SW Contact  Almarie CHRISTELLA Goodie, [place] Phone Number: 02/11/2024, 3:28 PM  Clinical Narrative:   CSW following for disposition. No beds available for patient at this time. CSW to follow.    Expected Discharge Plan: Skilled Nursing Facility Barriers to Discharge: English As A Second Language Teacher, Continued Medical Work up, Inadequate or no insurance, Facility will not accept until restraint criteria met, SNF Pending bed offer               Expected Discharge Plan and Services In-house Referral: Clinical Social Work   Post Acute Care Choice: Skilled Nursing Facility Living arrangements for the past 2 months: Single Family Home                                       Social Drivers of Health (SDOH) Interventions SDOH Screenings   Food Insecurity: No Food Insecurity (07/16/2023)   Received from [facility]  Housing: Low Risk  (07/16/2023)   Received from [facility]  Transportation Needs: No Transportation Needs (07/16/2023)   Received from [facility]  Utilities: Not At Risk (07/16/2023)   Received from [facility]  Alcohol Screen: Low Risk  (05/14/2023)  Financial Resource Strain: Low Risk  (07/16/2023)   Received from [facility]  Social Connections: Unknown (10/19/2022)   Received from [facility]  Tobacco Use: High Risk (01/26/2024)    Readmission Risk Interventions    03/26/2022   10:50 AM  Readmission Risk Prevention Plan  Transportation Screening Complete  HRI or Home Care Consult Complete  Social Work Consult for Recovery Care Planning/Counseling Complete  Palliative Care Screening Not Applicable  Medication Review Oceanographer) Referral to Pharmacy

## 2024-02-11 NOTE — Progress Notes (Signed)
 Occupational Therapy Treatment Patient Details Name: Amanda [name] MRN: [id number] DOB: Jun 11, 1960 Today's Date: 02/11/2024   History of present illness 63 yo female presents to [facility] on 9/22 with lethargy and near-syncope s/p cocaine and opioid ingestion. 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 S/p R frontal ventriculostomy, burr holes 9/22. ETT 9/22-9/23. PMHx of cocaine abuse, CHF, CKD, chronic cough, HTN and DM2.   OT comments  Pt received in supine, pleasant & agreeable to participate with OT. Co-treat with PT for maximum participation and for staff safety given pt's previous lability and agitation. Pt responding to spoken name, disoriented to remainder of surroundings. She needed mod A to come to EOB and mod-max A to sustain upright due to leavy L lateral and posterior lean/pushing. Excellent WB through LUE while at EOB this date. Pt requiring mod A for LB dressing and grooming (lotion application) 2/2 cognitive deficits related to initiation, volition, problem solving, and attention. Pt becoming increasingly agitated as session ended, returned to supine with RN in room. Will continue to follow and progress as able.      If plan is discharge home, recommend the following:  Two people to help with walking and/or transfers;A lot of help with bathing/dressing/bathroom;Assistance with cooking/housework;Direct supervision/assist for medications management;Direct supervision/assist for financial management;Assist for transportation;Help with stairs or ramp for entrance   Equipment Recommendations  Other (comment) (defer)    Recommendations for Other Services      Precautions / Restrictions Precautions Precautions: Fall;Other (comment) Recall of Precautions/Restrictions: Impaired Precaution/Restrictions Comments: PEG, SBP < 160, pushes to L, impulsive, irritable, labile Restrictions Weight  Bearing Restrictions Per Provider Order: No       Mobility Bed Mobility Overal bed mobility: Needs Assistance Bed Mobility: Supine to Sit, Sit to Supine     Supine to sit: Mod assist Sit to supine: Total assist, +2 for physical assistance   General bed mobility comments: cued to reach for bed rail, assist with LE's and trunk to come to upright sitting, total A +2 to return to supine 2/2 behaviors    Transfers                   General transfer comment: pt kept repeating I want to walk but declined standing/initiation of walking     Balance Overall balance assessment: Needs assistance Sitting-balance support: Feet supported, Single extremity supported, Bilateral upper extremity supported Sitting balance-Leahy Scale: Poor Sitting balance - Comments: heavy L lateral and posterior leans sitting unsupported EOB, needing mod-max A to sustain upright and prevent bumping head/falling off EOB, intermittent bouts of CGA when pt volitionally holding self upright                                   ADL either performed or assessed with clinical judgement   ADL Overall ADL's : Needs assistance/impaired Eating/Feeding: Total assistance Eating/Feeding Details (indicate cue type and reason): PEG Grooming: Moderate assistance;Sitting;Bed level Grooming Details (indicate cue type and reason): applying lotion to UE/LEs, assist to open and manipulate bottle, cues for initiation, cues to locate bottle on L, cues to apply lotion to LHB (only applying to R hand/leg             Lower Body Dressing: Moderate assistance;Bed level Lower Body Dressing Details (indicate cue type and reason): donning R sock, cues to completely don sock and assist to pull over heel  Extremity/Trunk Assessment              Vision       Perception Perception Perception: Impaired Preception Impairment Details: Inattention/Neglect Perception-Other Comments: L  inattention   Praxis     Communication Communication Communication: Impaired Factors Affecting Communication: Reduced clarity of speech   Cognition Arousal: Alert Behavior During Therapy: Impulsive, Agitated, Restless, Lability Cognition: Cognition impaired   Orientation impairments: Time, Situation, Place Awareness: Intellectual awareness impaired Memory impairment (select all impairments): Short-term memory, Working memory Attention impairment (select first level of impairment): Sustained attention Executive functioning impairment (select all impairments): Initiation, Sequencing, Reasoning, Problem solving OT - Cognition Comments: labile, reports therapists are stressing her out by providing trunk support so she does not fall/bump head on bed; no insight; poor attention/initiation                 Following commands: Impaired Following commands impaired: Follows one step commands inconsistently, Follows one step commands with increased time      Cueing   Cueing Techniques: Verbal cues, Tactile cues, Visual cues, Gestural cues  Exercises Other Exercises Other Exercises: Pt spontaneously WB through LUE throughout majority of session    Shoulder Instructions       General Comments RN in room upon exit, providing encouragement    Pertinent Vitals/ Pain       Pain Assessment Pain Assessment: Faces Faces Pain Scale: No hurt  Home Living                                          Prior Functioning/Environment              Frequency  Min 1X/week        Progress Toward Goals  OT Goals(current goals can now be found in the care plan section)  Progress towards OT goals: Progressing toward goals (slow progress)     Plan      Co-evaluation    PT/OT/SLP Co-Evaluation/Treatment: Yes Reason for Co-Treatment: Complexity of the patient's impairments (multi-system involvement);Necessary to address cognition/behavior during functional  activity;For patient/therapist safety;To address functional/ADL transfers;Other (comment) (pt's behavior) PT goals addressed during session: Mobility/safety with mobility;Balance;Strengthening/ROM OT goals addressed during session: ADL's and self-care;Other (comment) (cognition/attention)      AM-PAC OT 6 Clicks Daily Activity     Outcome Measure   Help from another person eating meals?: Total Help from another person taking care of personal grooming?: A Lot Help from another person toileting, which includes using toliet, bedpan, or urinal?: Total Help from another person bathing (including washing, rinsing, drying)?: A Lot Help from another person to put on and taking off regular upper body clothing?: A Lot Help from another person to put on and taking off regular lower body clothing?: A Lot 6 Click Score: 10    End of Session    OT Visit Diagnosis: Unsteadiness on feet (R26.81);Other abnormalities of gait and mobility (R26.89);Muscle weakness (generalized) (M62.81)   Activity Tolerance Other (comment) (limited by agitation)   Patient Left in bed;with call bell/phone within reach;with bed alarm set;with nursing/sitter in room   Nurse Communication Mobility status        Time: 8991-8964 OT Time Calculation (min): 27 min  Charges: OT General Charges $OT Visit: 1 Visit OT Treatments $Self Care/Home Management : 8-22 mins  Annamae Shivley D., MSOT, OTR/L Acute Rehabilitation Services [phone] Secure Chat Preferred  Jeryl Umholtz  Shron Ozer 02/11/2024, 1:04 PM

## 2024-02-12 DIAGNOSIS — F1410 Cocaine abuse, uncomplicated: Secondary | ICD-10-CM | POA: Diagnosis not present

## 2024-02-12 DIAGNOSIS — I5042 Chronic combined systolic (congestive) and diastolic (congestive) heart failure: Secondary | ICD-10-CM | POA: Diagnosis not present

## 2024-02-12 DIAGNOSIS — I610 Nontraumatic intracerebral hemorrhage in hemisphere, subcortical: Secondary | ICD-10-CM | POA: Diagnosis not present

## 2024-02-12 DIAGNOSIS — I619 Nontraumatic intracerebral hemorrhage, unspecified: Secondary | ICD-10-CM | POA: Diagnosis not present

## 2024-02-12 LAB — GLUCOSE, CAPILLARY
Glucose-Capillary: 135 mg/dL — ABNORMAL HIGH (ref 70–99)
Glucose-Capillary: 167 mg/dL — ABNORMAL HIGH (ref 70–99)
Glucose-Capillary: 112 mg/dL — ABNORMAL HIGH (ref 70–99)
Glucose-Capillary: 126 mg/dL — ABNORMAL HIGH (ref 70–99)
Glucose-Capillary: 115 mg/dL — ABNORMAL HIGH (ref 70–99)

## 2024-02-12 NOTE — Progress Notes (Signed)
 [facility]                                                                               Amanda [name], is a 63 y.o. female, DOB - Jun 27, 1960, FMW:[id number] Admit date - 01/03/2024    Outpatient Primary MD for the patient is Patient, No Pcp Per  LOS - 40  days    Brief summary   Patient is a 63 year old female with history of hypertension, cocaine abuse, diabetes type 2 who presented with lethargy, near syncope.  Found to be severely hypertensive, UDS positive for cocaine, with acute intraparenchymal hemorrhage , intraventricular hemorrhage and hydrocephalus.  Patient was admitted to ICU for close blood pressure management.  Neurosurgery consulted.  Status post right frontal ventriculostomy. EVD eventually removed on 10/3. Hospital course remarkable for dysphagia.  Speech therapy following.  Dietitian/speech therapy recommending PEG placement because she is not able to tolerate anything by mouth.  PT/OT recommending SNF on discharge.  Underwent PEG placement by IR on 10/16. 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  EVD removed on 10/3 Staples on the scalp removed on 02/01/2024.   Hypertension:  Well controlled.    Dysphagia S/p PEG placement on 10/16.   Now has progressed to dysphagia 3 diet but not having enough p.o. intake to meet nutritional needs of the body and thus she is still on PEG tube feedings as well.    Hospital delirium: Mostly starts getting agitated in the late afternoon.  On Seroquel  and soft restraints prn.     Chronic  combined systolic and diastolic CHF Resume entresto  and spironolactone .  Not on aspirin  due to recent hemorrhage.     Cocaine abuse Toc on board for cocaine cessation resources.      Malnutrition Type:  Nutrition Problem: Moderate Malnutrition Etiology: social / environmental circumstances (polysubstance abuse)   Malnutrition Characteristics:  Signs/Symptoms: mild muscle depletion, moderate fat depletion, mild fat depletion, moderate muscle depletion   Nutrition Interventions:  Interventions: Prostat, Tube feeding  Estimated body mass index is 19.26 kg/m as calculated from the following:   Height as of this encounter: 5' 5 (1.651 m).   Weight as of this encounter: 52.5 kg.  Code Status: full code.  DVT Prophylaxis:  heparin  injection 5,000 Units Start: 01/05/24 1015 Place and maintain sequential compression device Start: 01/03/24 0611 SCDs Start: 01/03/24 0429   Level of Care: Level of care: Progressive Family Communication: none at bedside.   Disposition Plan:     Remains inpatient appropriate:  pending SNF.   Procedures:    Consultants:   None.   Antimicrobials:   Anti-infectives (From admission, onward)    Start     Dose/Rate Route Frequency Ordered Stop   01/27/24 0807  ceFAZolin (ANCEF) IVPB 2g/100 mL premix  over 30 Minutes Intravenous Continuous PRN 01/27/24 0807 01/27/24 0807   01/13/24 0600  ceFAZolin (ANCEF) IVPB 2g/100 mL premix        2 g 200 mL/hr over 30 Minutes Intravenous On call to O.R. 01/11/24 2300 01/13/24 0557        Medications  Scheduled Meds:  carvedilol   25 mg Oral BID WC   feeding supplement (PROSource TF20)  60 mL Per Tube Daily   free water   60 mL Per Tube Q4H   heparin  injection (subcutaneous)  5,000 Units Subcutaneous Q8H   insulin  aspart  0-9 Units Subcutaneous Q4H   mineral oil-hydrophilic petrolatum    Topical BID   mouth rinse  15 mL Mouth Rinse 4 times per day   QUEtiapine   25 mg Oral QHS    sacubitril -valsartan   1 tablet Oral BID   spironolactone   25 mg Oral Daily   Continuous Infusions:  feeding supplement (OSMOLITE 1.5 CAL) 1,000 mL (02/12/24 1803)   PRN Meds:.acetaminophen  **OR** acetaminophen  (TYLENOL ) oral liquid 160 mg/5 mL **OR** acetaminophen , benzonatate , hydrALAZINE , ipratropium-albuterol , labetalol , menthol, OLANZapine , mouth rinse, polyethylene glycol, white petrolatum     Subjective:   Amanda [name] was seen and examined today.  Pt requesting to put purewick.   Objective:   Vitals:   02/12/24 0822 02/12/24 0822 02/12/24 1124 02/12/24 1604  BP: 116/81 116/81 115/85 (!) 125/91  Pulse: 68 66 68 75  Resp: 16 16 18 16   Temp: 98.4 F (36.9 C) 98.4 F (36.9 C) 98.6 F (37 C) 98.7 F (37.1 C)  TempSrc:   Axillary Axillary  SpO2: 97% 98% 98% 100%  Weight:      Height:        Intake/Output Summary (Last 24 hours) at 02/12/2024 1811 Last data filed at 02/12/2024 1657 Gross per 24 hour  Intake 1678.67 ml  Output 350 ml  Net 1328.67 ml   Filed Weights   02/07/24 0500 02/11/24 0609 02/12/24 0500  Weight: 54.6 kg 53.1 kg 52.5 kg     Exam General exam: Appears calm and comfortable  Respiratory system: Clear to auscultation. Respiratory effort normal. Cardiovascular system: S1 & S2 heard, RRR.  Gastrointestinal system: Abdomen is nondistended, soft and nontender. Central nervous system: Alert and oriented to self.  Extremities: no pedal edema.  Skin: No rashes,   Psychiatry: Mood & affect appropriate.   Data Reviewed:  I have personally reviewed following labs and imaging studies   CBC Lab Results  Component Value Date   WBC 7.2 01/29/2024   RBC 4.63 01/29/2024   HGB 13.3 01/29/2024   HCT 40.1 01/29/2024   MCV 86.6 01/29/2024   MCH 28.7 01/29/2024   PLT 284 01/29/2024   MCHC 33.2 01/29/2024   RDW 14.2 01/29/2024   LYMPHSABS 1.8 01/15/2024   MONOABS 0.9 01/15/2024   EOSABS 0.0 01/15/2024   BASOSABS 0.0 01/15/2024     Last metabolic  panel Lab Results  Component Value Date   NA 135 01/29/2024   K 4.0 01/29/2024   CL 99 01/29/2024   CO2 24 01/29/2024   BUN 36 (H) 01/29/2024   CREATININE 0.86 01/29/2024   GLUCOSE 173 (H) 01/29/2024   GFRNONAA >60 01/29/2024   GFRAA >90 01/21/2014   CALCIUM 9.9 01/29/2024   PHOS 4.1 01/21/2024   PROT 7.0 01/03/2024   ALBUMIN 2.9 (L) 01/03/2024   LABGLOB 3.6 11/15/2023   BILITOT 0.6 01/03/2024   ALKPHOS 66 01/03/2024   AST 24 01/03/2024   ALT 14 01/03/2024   ANIONGAP 12 01/29/2024  CBG (last 3)  Recent Labs    02/12/24 0824 02/12/24 1127 02/12/24 1607  GLUCAP 167* 112* 126*      Coagulation Profile: No results for input(s): INR, PROTIME in the last 168 hours.   Radiology Studies: No results found.     Elgie Butter M.D. [facility] 02/12/2024, 6:11 PM  Available via Epic secure chat 7am-7pm After 7 pm, please refer to night coverage provider listed on amion.

## 2024-02-12 NOTE — Plan of Care (Addendum)
 Pt refused insulin  coverage injections as well as Heparin  Sq injections, MD C. Shona made aware. Problem: Health Behavior/Discharge Planning: Goal: Ability to manage health-related needs will improve Outcome: Progressing   Problem: Metabolic: Goal: Ability to maintain appropriate glucose levels will improve Outcome: Progressing   Problem: Nutritional: Goal: Maintenance of adequate nutrition will improve Outcome: Progressing   Problem: Nutritional: Goal: Progress toward achieving an optimal weight will improve Outcome: Progressing   Problem: Skin Integrity: Goal: Risk for impaired skin integrity will decrease Outcome: Progressing   Problem: Education: Goal: Knowledge of General Education information will improve Description: Including pain rating scale, medication(s)/side effects and non-pharmacologic comfort measures Outcome: Progressing   Problem: Clinical Measurements: Goal: Will remain free from infection Outcome: Progressing   Problem: Activity: Goal: Risk for activity intolerance will decrease Outcome: Progressing   Problem: Safety: Goal: Ability to remain free from injury will improve Outcome: Progressing   Problem: Skin Integrity: Goal: Risk for impaired skin integrity will decrease Outcome: Progressing   Problem: Intracerebral Hemorrhage Tissue Perfusion: Goal: Complications of Intracerebral Hemorrhage will be minimized Outcome: Progressing   Problem: Safety: Goal: Non-violent Restraint(s) Outcome: Progressing

## 2024-02-13 DIAGNOSIS — F1410 Cocaine abuse, uncomplicated: Secondary | ICD-10-CM | POA: Diagnosis not present

## 2024-02-13 DIAGNOSIS — I619 Nontraumatic intracerebral hemorrhage, unspecified: Secondary | ICD-10-CM | POA: Diagnosis not present

## 2024-02-13 DIAGNOSIS — I5042 Chronic combined systolic (congestive) and diastolic (congestive) heart failure: Secondary | ICD-10-CM | POA: Diagnosis not present

## 2024-02-13 DIAGNOSIS — I610 Nontraumatic intracerebral hemorrhage in hemisphere, subcortical: Secondary | ICD-10-CM | POA: Diagnosis not present

## 2024-02-13 LAB — GLUCOSE, CAPILLARY
Glucose-Capillary: 110 mg/dL — ABNORMAL HIGH (ref 70–99)
Glucose-Capillary: 112 mg/dL — ABNORMAL HIGH (ref 70–99)
Glucose-Capillary: 112 mg/dL — ABNORMAL HIGH (ref 70–99)
Glucose-Capillary: 113 mg/dL — ABNORMAL HIGH (ref 70–99)
Glucose-Capillary: 123 mg/dL — ABNORMAL HIGH (ref 70–99)
Glucose-Capillary: 71 mg/dL (ref 70–99)

## 2024-02-13 NOTE — Plan of Care (Signed)
  Problem: Health Behavior/Discharge Planning: Goal: Ability to manage health-related needs will improve Outcome: Progressing   Problem: Nutritional: Goal: Maintenance of adequate nutrition will improve Outcome: Progressing   Problem: Health Behavior/Discharge Planning: Goal: Ability to manage health-related needs will improve Outcome: Progressing   Problem: Clinical Measurements: Goal: Will remain free from infection Outcome: Progressing   Problem: Nutrition: Goal: Adequate nutrition will be maintained Outcome: Progressing   Problem: Activity: Goal: Risk for activity intolerance will decrease Outcome: Progressing   Problem: Coping: Goal: Level of anxiety will decrease Outcome: Progressing   Problem: Safety: Goal: Ability to remain free from injury will improve Outcome: Progressing   Problem: Skin Integrity: Goal: Risk for impaired skin integrity will decrease Outcome: Progressing

## 2024-02-13 NOTE — Progress Notes (Signed)
 [facility]                                                                               Amanda [name], is a 63 y.o. female, DOB - 04/26/1960, FMW:[id number] Admit date - 01/03/2024    Outpatient Primary MD for the patient is Patient, No Pcp Per  LOS - 41  days    Brief summary   Patient is a 63 year old female with history of hypertension, cocaine abuse, diabetes type 2 who presented with lethargy, near syncope.  Found to be severely hypertensive, UDS positive for cocaine, with acute intraparenchymal hemorrhage , intraventricular hemorrhage and hydrocephalus.  Patient was admitted to ICU for close blood pressure management.  Neurosurgery consulted.  Status post right frontal ventriculostomy. EVD eventually removed on 10/3. Hospital course remarkable for dysphagia.  Speech therapy following.  Dietitian/speech therapy recommending PEG placement because she is not able to tolerate anything by mouth.  PT/OT recommending SNF on discharge.  Underwent PEG placement by IR on 10/16. TOC following    Significant events: 9/22 Admitted and intubated, emergent burr holes and right frontal ventriculostomy placed by neurosurgery due to neurological worsening 9/23 extubated 10/1 EVD clamped 10/3 EVD removed 10/16 PEG placement by IR for worsening dysphagia 11/2 no new events overnight.    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  EVD removed on 10/3 Staples on the scalp removed on 02/01/2024. Patient denies any new complaints.    Hypertension:  Optimal BP parameters.    Dysphagia S/p PEG placement on 10/16.   Now has progressed to dysphagia 3 diet but not having enough p.o. intake to meet nutritional needs of the body and thus she is still on PEG tube feedings as well.    Hospital delirium: Mostly starts getting agitated in  the late afternoon.  On Seroquel  and soft restraints prn.     Chronic combined systolic and diastolic CHF Resume entresto  and spironolactone .  Not on aspirin  due to recent hemorrhage.     Cocaine abuse Toc on board for cocaine cessation resources.   No active issues , waiting for SNF.      Malnutrition Type:  Nutrition Problem: Moderate Malnutrition Etiology: social / environmental circumstances (polysubstance abuse)   Malnutrition Characteristics:  Signs/Symptoms: mild muscle depletion, moderate fat depletion, mild fat depletion, moderate muscle depletion   Nutrition Interventions:  Interventions: Prostat, Tube feeding  Estimated body mass index is 20.18 kg/m as calculated from the following:   Height as of this encounter: 5' 5 (1.651 m).   Weight as of this encounter: 55 kg.  Code Status: full code.  DVT Prophylaxis:  heparin  injection 5,000 Units Start: 01/05/24 1015 Place and maintain sequential compression device Start: 01/03/24 0611 SCDs Start: 01/03/24 0429   Level of Care: Level of care: Progressive Family Communication: none at bedside.   Disposition Plan:     Remains inpatient appropriate:  pending SNF.   Procedures:    Consultants:   None.   Antimicrobials:   Anti-infectives (From admission, onward)    Start  Dose/Rate Route Frequency Ordered Stop   01/27/24 0807  ceFAZolin (ANCEF) IVPB 2g/100 mL premix        over 30 Minutes Intravenous Continuous PRN 01/27/24 0807 01/27/24 0807   01/13/24 0600  ceFAZolin (ANCEF) IVPB 2g/100 mL premix        2 g 200 mL/hr over 30 Minutes Intravenous On call to O.R. 01/11/24 2300 01/13/24 0557        Medications  Scheduled Meds:  carvedilol   25 mg Oral BID WC   feeding supplement (PROSource TF20)  60 mL Per Tube Daily   free water   60 mL Per Tube Q4H   heparin  injection (subcutaneous)  5,000 Units Subcutaneous Q8H   insulin  aspart  0-9 Units Subcutaneous Q4H   mineral oil-hydrophilic  petrolatum    Topical BID   mouth rinse  15 mL Mouth Rinse 4 times per day   QUEtiapine   25 mg Oral QHS   sacubitril -valsartan   1 tablet Oral BID   spironolactone   25 mg Oral Daily   Continuous Infusions:  feeding supplement (OSMOLITE 1.5 CAL) 1,000 mL (02/12/24 1803)   PRN Meds:.acetaminophen  **OR** acetaminophen  (TYLENOL ) oral liquid 160 mg/5 mL **OR** acetaminophen , benzonatate , hydrALAZINE , ipratropium-albuterol , labetalol , menthol, OLANZapine , mouth rinse, polyethylene glycol, white petrolatum     Subjective:   Amanda [name] was seen and examined today.  She is teary that nobody cared to call her. But no other complaints.    Objective:   Vitals:   02/13/24 0349 02/13/24 0500 02/13/24 0811 02/13/24 1210  BP: 94/70  118/85 (!) 131/90  Pulse: 76  78 77  Resp: 16  17 18   Temp: 97.9 F (36.6 C)  97.6 F (36.4 C) 98.1 F (36.7 C)  TempSrc: Axillary  Oral Oral  SpO2: 94%  94% 94%  Weight:  55 kg    Height:        Intake/Output Summary (Last 24 hours) at 02/13/2024 1505 Last data filed at 02/13/2024 1149 Gross per 24 hour  Intake 390 ml  Output 350 ml  Net 40 ml   Filed Weights   02/11/24 0609 02/12/24 0500 02/13/24 0500  Weight: 53.1 kg 52.5 kg 55 kg     Exam General exam: Appears calm and comfortable  Respiratory system: Clear to auscultation. Respiratory effort normal. Cardiovascular system: S1 & S2 heard, RRR. No JVD Gastrointestinal system: Abdomen is nondistended, soft and nontender.  Central nervous system: Alert and oriented.  Hemiparesis on the left, improving.  Extremities: no edema Skin: No rashes,  Psychiatry: Mood & affect appropriate.    Data Reviewed:  I have personally reviewed following labs and imaging studies   CBC Lab Results  Component Value Date   WBC 7.2 01/29/2024   RBC 4.63 01/29/2024   HGB 13.3 01/29/2024   HCT 40.1 01/29/2024   MCV 86.6 01/29/2024   MCH 28.7 01/29/2024   PLT 284 01/29/2024   MCHC 33.2 01/29/2024   RDW 14.2  01/29/2024   LYMPHSABS 1.8 01/15/2024   MONOABS 0.9 01/15/2024   EOSABS 0.0 01/15/2024   BASOSABS 0.0 01/15/2024     Last metabolic panel Lab Results  Component Value Date   NA 135 01/29/2024   K 4.0 01/29/2024   CL 99 01/29/2024   CO2 24 01/29/2024   BUN 36 (H) 01/29/2024   CREATININE 0.86 01/29/2024   GLUCOSE 173 (H) 01/29/2024   GFRNONAA >60 01/29/2024   GFRAA >90 01/21/2014   CALCIUM 9.9 01/29/2024   PHOS 4.1 01/21/2024   PROT 7.0 01/03/2024  ALBUMIN 2.9 (L) 01/03/2024   LABGLOB 3.6 11/15/2023   BILITOT 0.6 01/03/2024   ALKPHOS 66 01/03/2024   AST 24 01/03/2024   ALT 14 01/03/2024   ANIONGAP 12 01/29/2024    CBG (last 3)  Recent Labs    02/13/24 0409 02/13/24 0811 02/13/24 1156  GLUCAP 112* 110* 113*      Coagulation Profile: No results for input(s): INR, PROTIME in the last 168 hours.   Radiology Studies: No results found.     Elgie Butter M.D. [facility] 02/13/2024, 3:05 PM  Available via Epic secure chat 7am-7pm After 7 pm, please refer to night coverage provider listed on amion.

## 2024-02-14 DIAGNOSIS — I619 Nontraumatic intracerebral hemorrhage, unspecified: Secondary | ICD-10-CM | POA: Diagnosis not present

## 2024-02-14 DIAGNOSIS — F1410 Cocaine abuse, uncomplicated: Secondary | ICD-10-CM | POA: Diagnosis not present

## 2024-02-14 DIAGNOSIS — I610 Nontraumatic intracerebral hemorrhage in hemisphere, subcortical: Secondary | ICD-10-CM | POA: Diagnosis not present

## 2024-02-14 DIAGNOSIS — I5042 Chronic combined systolic (congestive) and diastolic (congestive) heart failure: Secondary | ICD-10-CM | POA: Diagnosis not present

## 2024-02-14 LAB — GLUCOSE, CAPILLARY
Glucose-Capillary: 103 mg/dL — ABNORMAL HIGH (ref 70–99)
Glucose-Capillary: 117 mg/dL — ABNORMAL HIGH (ref 70–99)
Glucose-Capillary: 118 mg/dL — ABNORMAL HIGH (ref 70–99)
Glucose-Capillary: 119 mg/dL — ABNORMAL HIGH (ref 70–99)
Glucose-Capillary: 124 mg/dL — ABNORMAL HIGH (ref 70–99)
Glucose-Capillary: 134 mg/dL — ABNORMAL HIGH (ref 70–99)
Glucose-Capillary: 136 mg/dL — ABNORMAL HIGH (ref 70–99)

## 2024-02-14 NOTE — Progress Notes (Signed)
 [facility]                                                                               Amanda [name], is a 63 y.o. female, DOB - 12/20/1960, FMW:[id number] Admit date - 01/03/2024    Outpatient Primary MD for the patient is Patient, No Pcp Per  LOS - 42  days    Brief summary   Patient is a 63 year old female with history of hypertension, cocaine abuse, diabetes type 2 who presented with lethargy, near syncope.  Found to be severely hypertensive, UDS positive for cocaine, with acute intraparenchymal hemorrhage , intraventricular hemorrhage and hydrocephalus.  Patient was admitted to ICU for close blood pressure management.  Neurosurgery consulted.  Status post right frontal ventriculostomy. EVD eventually removed on 10/3. Hospital course remarkable for dysphagia.  Speech therapy following.  Dietitian/speech therapy recommending PEG placement because she is not able to tolerate anything by mouth.  PT/OT recommending SNF on discharge.  Underwent PEG placement by IR on 10/16. TOC following    Significant events: 9/22 Admitted and intubated, emergent burr holes and right frontal ventriculostomy placed by neurosurgery due to neurological worsening 9/23 extubated 10/1 EVD clamped 10/3 EVD removed 10/16 PEG placement by IR for worsening dysphagia 11/3 no new events overnight.    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  EVD removed on 10/3 Staples on the scalp removed on 02/01/2024. Patient denies any new complaints.    Hypertension:  Well controlled.    Dysphagia S/p PEG placement on 10/16.   Now has progressed to dysphagia 3 diet but not having enough p.o. intake to meet nutritional needs of the body and thus she is still on PEG tube feedings as well.    Hospital delirium: Mostly starts getting agitated in the  late afternoon.  On Seroquel  and soft restraints prn.     Chronic combined systolic and diastolic CHF Resume entresto  and spironolactone .  Not on aspirin  due to recent hemorrhage.     Cocaine abuse Toc on board for cocaine cessation resources.   No active issues , waiting for SNF.      Malnutrition Type:  Nutrition Problem: Moderate Malnutrition Etiology: social / environmental circumstances (polysubstance abuse)   Malnutrition Characteristics:  Signs/Symptoms: mild muscle depletion, moderate fat depletion, mild fat depletion, moderate muscle depletion   Nutrition Interventions:  Interventions: Prostat, Tube feeding  Estimated body mass index is 20.4 kg/m as calculated from the following:   Height as of this encounter: 5' 5 (1.651 m).   Weight as of this encounter: 55.6 kg.  Code Status: full code.  DVT Prophylaxis:  heparin  injection 5,000 Units Start: 01/05/24 1015 Place and maintain sequential compression device Start: 01/03/24 0611 SCDs Start: 01/03/24 0429   Level of Care: Level of care: Progressive Family Communication: none at bedside.   Disposition Plan:     Remains inpatient appropriate:  pending SNF.   Procedures:    Consultants:   None.   Antimicrobials:   Anti-infectives (From admission, onward)    Start  Dose/Rate Route Frequency Ordered Stop   01/27/24 0807  ceFAZolin (ANCEF) IVPB 2g/100 mL premix        over 30 Minutes Intravenous Continuous PRN 01/27/24 0807 01/27/24 0807   01/13/24 0600  ceFAZolin (ANCEF) IVPB 2g/100 mL premix        2 g 200 mL/hr over 30 Minutes Intravenous On call to O.R. 01/11/24 2300 01/13/24 0557        Medications  Scheduled Meds:  carvedilol   25 mg Oral BID WC   feeding supplement (PROSource TF20)  60 mL Per Tube Daily   free water   60 mL Per Tube Q4H   heparin  injection (subcutaneous)  5,000 Units Subcutaneous Q8H   insulin  aspart  0-9 Units Subcutaneous Q4H   mineral oil-hydrophilic petrolatum     Topical BID   mouth rinse  15 mL Mouth Rinse 4 times per day   QUEtiapine   25 mg Oral QHS   sacubitril -valsartan   1 tablet Oral BID   spironolactone   25 mg Oral Daily   Continuous Infusions:  feeding supplement (OSMOLITE 1.5 CAL) 1,000 mL (02/14/24 0213)   PRN Meds:.acetaminophen  **OR** acetaminophen  (TYLENOL ) oral liquid 160 mg/5 mL **OR** acetaminophen , benzonatate , hydrALAZINE , ipratropium-albuterol , labetalol , menthol, OLANZapine , mouth rinse, polyethylene glycol, white petrolatum     Subjective:   Amanda [name] was seen and examined today.  Alert and stable. No new complaints.   Objective:   Vitals:   02/14/24 0010 02/14/24 0432 02/14/24 0433 02/14/24 0843  BP: 115/64 (!) 108/97  127/81  Pulse: 68 75  74  Resp: 18 16  20   Temp: 98.3 F (36.8 C) 98.2 F (36.8 C)  97.7 F (36.5 C)  TempSrc: Oral Oral    SpO2: 95% 98%  97%  Weight:   55.6 kg   Height:        Intake/Output Summary (Last 24 hours) at 02/14/2024 1324 Last data filed at 02/14/2024 1120 Gross per 24 hour  Intake 420 ml  Output 500 ml  Net -80 ml   Filed Weights   02/12/24 0500 02/13/24 0500 02/14/24 0433  Weight: 52.5 kg 55 kg 55.6 kg     Exam General exam: Appears calm and comfortable  Respiratory system: Clear to auscultation. Respiratory effort normal. Cardiovascular system: S1 & S2 heard, RRR. Gastrointestinal system: Abdomen is nondistended, soft and nontender.  Central nervous system: Alert and oriented to person and place, left hemiparesis improving.  Extremities: no edema.  Skin: No rashes,     Data Reviewed:  I have personally reviewed following labs and imaging studies   CBC Lab Results  Component Value Date   WBC 7.2 01/29/2024   RBC 4.63 01/29/2024   HGB 13.3 01/29/2024   HCT 40.1 01/29/2024   MCV 86.6 01/29/2024   MCH 28.7 01/29/2024   PLT 284 01/29/2024   MCHC 33.2 01/29/2024   RDW 14.2 01/29/2024   LYMPHSABS 1.8 01/15/2024   MONOABS 0.9 01/15/2024   EOSABS 0.0  01/15/2024   BASOSABS 0.0 01/15/2024     Last metabolic panel Lab Results  Component Value Date   NA 135 01/29/2024   K 4.0 01/29/2024   CL 99 01/29/2024   CO2 24 01/29/2024   BUN 36 (H) 01/29/2024   CREATININE 0.86 01/29/2024   GLUCOSE 173 (H) 01/29/2024   GFRNONAA >60 01/29/2024   GFRAA >90 01/21/2014   CALCIUM 9.9 01/29/2024   PHOS 4.1 01/21/2024   PROT 7.0 01/03/2024   ALBUMIN 2.9 (L) 01/03/2024   LABGLOB 3.6 11/15/2023   BILITOT 0.6  01/03/2024   ALKPHOS 66 01/03/2024   AST 24 01/03/2024   ALT 14 01/03/2024   ANIONGAP 12 01/29/2024    CBG (last 3)  Recent Labs    02/14/24 0105 02/14/24 0428 02/14/24 0839  GLUCAP 124* 117* 118*      Coagulation Profile: No results for input(s): INR, PROTIME in the last 168 hours.   Radiology Studies: No results found.     Elgie Butter M.D. [facility] 02/14/2024, 1:24 PM  Available via Epic secure chat 7am-7pm After 7 pm, please refer to night coverage provider listed on amion.

## 2024-02-15 DIAGNOSIS — I5042 Chronic combined systolic (congestive) and diastolic (congestive) heart failure: Secondary | ICD-10-CM | POA: Diagnosis not present

## 2024-02-15 DIAGNOSIS — I610 Nontraumatic intracerebral hemorrhage in hemisphere, subcortical: Secondary | ICD-10-CM | POA: Diagnosis not present

## 2024-02-15 DIAGNOSIS — F1410 Cocaine abuse, uncomplicated: Secondary | ICD-10-CM | POA: Diagnosis not present

## 2024-02-15 DIAGNOSIS — I619 Nontraumatic intracerebral hemorrhage, unspecified: Secondary | ICD-10-CM | POA: Diagnosis not present

## 2024-02-15 LAB — GLUCOSE, CAPILLARY
Glucose-Capillary: 150 mg/dL — ABNORMAL HIGH (ref 70–99)
Glucose-Capillary: 85 mg/dL (ref 70–99)
Glucose-Capillary: 128 mg/dL — ABNORMAL HIGH (ref 70–99)
Glucose-Capillary: 112 mg/dL — ABNORMAL HIGH (ref 70–99)
Glucose-Capillary: 108 mg/dL — ABNORMAL HIGH (ref 70–99)

## 2024-02-15 NOTE — Plan of Care (Signed)
  Problem: Education: Goal: Ability to describe self-care measures that may prevent or decrease complications (Diabetes Survival Skills Education) will improve Outcome: Progressing   Problem: Coping: Goal: Ability to adjust to condition or change in health will improve Outcome: Progressing   Problem: Fluid Volume: Goal: Ability to maintain a balanced intake and output will improve Outcome: Progressing   Problem: Health Behavior/Discharge Planning: Goal: Ability to identify and utilize available resources and services will improve Outcome: Progressing Goal: Ability to manage health-related needs will improve Outcome: Progressing   Problem: Metabolic: Goal: Ability to maintain appropriate glucose levels will improve Outcome: Progressing   Problem: Nutritional: Goal: Maintenance of adequate nutrition will improve Outcome: Progressing Goal: Progress toward achieving an optimal weight will improve Outcome: Progressing   Problem: Skin Integrity: Goal: Risk for impaired skin integrity will decrease Outcome: Progressing   Problem: Tissue Perfusion: Goal: Adequacy of tissue perfusion will improve Outcome: Progressing   Problem: Education: Goal: Knowledge of General Education information will improve Description: Including pain rating scale, medication(s)/side effects and non-pharmacologic comfort measures Outcome: Progressing   Problem: Health Behavior/Discharge Planning: Goal: Ability to manage health-related needs will improve Outcome: Progressing   Problem: Clinical Measurements: Goal: Ability to maintain clinical measurements within normal limits will improve Outcome: Progressing Goal: Will remain free from infection Outcome: Progressing Goal: Diagnostic test results will improve Outcome: Progressing Goal: Respiratory complications will improve Outcome: Progressing Goal: Cardiovascular complication will be avoided Outcome: Progressing   Problem: Activity: Goal:  Risk for activity intolerance will decrease Outcome: Progressing   Problem: Nutrition: Goal: Adequate nutrition will be maintained Outcome: Progressing   Problem: Coping: Goal: Level of anxiety will decrease Outcome: Progressing   Problem: Elimination: Goal: Will not experience complications related to bowel motility Outcome: Progressing Goal: Will not experience complications related to urinary retention Outcome: Progressing   Problem: Pain Managment: Goal: General experience of comfort will improve and/or be controlled Outcome: Progressing   Problem: Safety: Goal: Ability to remain free from injury will improve Outcome: Progressing   Problem: Skin Integrity: Goal: Risk for impaired skin integrity will decrease Outcome: Progressing   Problem: Education: Goal: Knowledge of disease or condition will improve Outcome: Progressing Goal: Knowledge of secondary prevention will improve (MUST DOCUMENT ALL) Outcome: Progressing Goal: Knowledge of patient specific risk factors will improve (DELETE if not current risk factor) Outcome: Progressing   Problem: Intracerebral Hemorrhage Tissue Perfusion: Goal: Complications of Intracerebral Hemorrhage will be minimized Outcome: Progressing   Problem: Coping: Goal: Will verbalize positive feelings about self Outcome: Progressing Goal: Will identify appropriate support needs Outcome: Progressing   Problem: Health Behavior/Discharge Planning: Goal: Ability to manage health-related needs will improve Outcome: Progressing Goal: Goals will be collaboratively established with patient/family Outcome: Progressing   Problem: Self-Care: Goal: Ability to participate in self-care as condition permits will improve Outcome: Progressing Goal: Verbalization of feelings and concerns over difficulty with self-care will improve Outcome: Progressing Goal: Ability to communicate needs accurately will improve Outcome: Progressing   Problem:  Nutrition: Goal: Risk of aspiration will decrease Outcome: Progressing Goal: Dietary intake will improve Outcome: Progressing   Problem: Safety: Goal: Non-violent Restraint(s) Outcome: Progressing

## 2024-02-15 NOTE — Progress Notes (Signed)
 Physical Therapy Treatment Patient Details Name: Amanda [name] MRN: [id number] DOB: 1960-06-13 Today's Date: 02/15/2024   History of Present Illness 63 yo female [facility] with lethargy and near-syncope s/p cocaine and opioid ingestion. 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 S/p R frontal ventriculostomy, burr holes 9/22. ETT 9/22-9/23. PMHx of cocaine abuse, CHF, CKD, chronic cough, HTN and DM2.    PT Comments  Pt received in supine and eager for mobility. Pt demonstrates improved participation and orientation this session, allowing mobility progression. Pt reports awareness of stroke and is educated on effects on functional mobility. Pt continues to demonstrate impulsivity and decreased awareness requiring increased assist and cues throughout session. Pt able to stand with min A +2 and ambulate to the sink with mod A +2. Pt demonstrates a posterior bias without awareness. Pt requests to use the bathroom and is able to have a BM with total A for pericare. Pt requests to ambulate with RW again and demonstrates improved upright posture and RW management with dense cues and assist. Pt continues to benefit from PT services to progress toward functional mobility goals.     If plan is discharge home, recommend the following: Two people to help with walking and/or transfers;Supervision due to cognitive status;Assist for transportation;Help with stairs or ramp for entrance;Assistance with cooking/housework;Direct supervision/assist for medications management;Direct supervision/assist for financial management   Can travel by private vehicle     No  Equipment Recommendations  Hospital bed;Wheelchair (measurements PT);Wheelchair cushion (measurements PT);Hoyer lift;BSC/3in1;Rolling walker (2 wheels)    Recommendations for Other Services       Precautions / Restrictions  Precautions Precautions: Fall;Other (comment) Recall of Precautions/Restrictions: Impaired Precaution/Restrictions Comments: PEG, SBP < 160, impulsive, irritable, labile Restrictions Weight Bearing Restrictions Per Provider Order: No     Mobility  Bed Mobility Overal bed mobility: Needs Assistance Bed Mobility: Supine to Sit, Sit to Supine     Supine to sit: Min assist, HOB elevated Sit to supine: Contact guard assist   General bed mobility comments: Min A for balance and increased time/effort to complete    Transfers Overall transfer level: Needs assistance Equipment used: Rolling walker (2 wheels), None Transfers: Sit to/from Stand Sit to Stand: Min assist, Mod assist, +2 physical assistance, +2 safety/equipment           General transfer comment: STS with min A +2 progressing to mod A +1 due to posterior bias    Ambulation/Gait Ambulation/Gait assistance: Mod assist, +2 physical assistance, +2 safety/equipment Gait Distance (Feet): 5 Feet (+5, +10) Assistive device: 1 person hand held assist, 2 person hand held assist Gait Pattern/deviations: Step-through pattern, Step-to pattern, Decreased stride length, Trunk flexed, Knee hyperextension - left Gait velocity: reduced     General Gait Details: Pt demonstrates short, ataxic steps with low foot clearance and flexed trunk. Pt demonstrates poor RW management requiring assist. Dense cues for upright posture, RW proximity, increased foot clearance, and increased step length.   Stairs             Wheelchair Mobility     Tilt Bed    Modified Rankin (Stroke Patients Only) Modified Rankin (Stroke Patients Only) Pre-Morbid Rankin Score: No symptoms Modified Rankin: Severe disability     Balance Overall balance assessment: Needs assistance Sitting-balance support: Feet supported, Single extremity supported, Bilateral upper extremity supported Sitting balance-Leahy Scale: Poor Sitting balance - Comments: CGA  sitting EOB with posterior lean initially, but pt able  to correct with cues   Standing balance support: Bilateral upper extremity supported, During functional activity, Reliant on assistive device for balance Standing balance-Leahy Scale: Poor Standing balance comment: Pt reliant on RW and external assist due to posterior bias                            Communication Communication Communication: Impaired Factors Affecting Communication: Reduced clarity of speech  Cognition Arousal: Alert Behavior During Therapy: Impulsive, Restless   PT - Cognitive impairments: Awareness, Attention, Safety/Judgement, Sequencing, Initiation, Problem solving, Memory, Orientation                       PT - Cognition Comments: Pt more open to instruction and cues this session with improved orientation and awareness of situation. Pt continues to be impulsive with mobility requiring increased assist and cues for safety Following commands: Impaired Following commands impaired: Follows one step commands inconsistently, Follows one step commands with increased time    Cueing Cueing Techniques: Verbal cues, Tactile cues, Visual cues, Gestural cues  Exercises      General Comments        Pertinent Vitals/Pain Pain Assessment Pain Assessment: Faces Faces Pain Scale: No hurt     PT Goals (current goals can now be found in the care plan section) Acute Rehab PT Goals Patient Stated Goal: to go home PT Goal Formulation: With patient Time For Goal Achievement: 02/15/24 Progress towards PT goals: Progressing toward goals    Frequency    Min 1X/week      PT Plan      Co-evaluation PT/OT/SLP Co-Evaluation/Treatment: Yes Reason for Co-Treatment: Complexity of the patient's impairments (multi-system involvement);Necessary to address cognition/behavior during functional activity;For patient/therapist safety;To address functional/ADL transfers;Other (comment) PT goals addressed  during session: Mobility/safety with mobility;Balance;Strengthening/ROM        AM-PAC PT 6 Clicks Mobility   Outcome Measure  Help needed turning from your back to your side while in a flat bed without using bedrails?: A Little Help needed moving from lying on your back to sitting on the side of a flat bed without using bedrails?: A Little Help needed moving to and from a bed to a chair (including a wheelchair)?: A Lot Help needed standing up from a chair using your arms (e.g., wheelchair or bedside chair)?: A Lot Help needed to walk in hospital room?: Total Help needed climbing 3-5 steps with a railing? : Total 6 Click Score: 12    End of Session Equipment Utilized During Treatment: Gait belt Activity Tolerance: Patient tolerated treatment well Patient left: in bed;with call bell/phone within reach;with bed alarm set Nurse Communication: Mobility status PT Visit Diagnosis: Unsteadiness on feet (R26.81);Difficulty in walking, not elsewhere classified (R26.2);Other abnormalities of gait and mobility (R26.89);Other symptoms and signs involving the nervous system (R29.898);Muscle weakness (generalized) (M62.81)     Time: 9068-8992 PT Time Calculation (min) (ACUTE ONLY): 36 min  Charges:    $Gait Training: 8-22 mins PT General Charges $$ ACUTE PT VISIT: 1 Visit                    Darryle George, PTA Acute Rehabilitation Services Secure Chat Preferred  [phone]    Darryle George 02/15/2024, 12:11 PM

## 2024-02-15 NOTE — Progress Notes (Signed)
 Speech Language Pathology Treatment: Dysphagia;Cognitive-Linguistic  Patient Details Name: CHAKARA BOGNAR MRN: [id number] DOB: September 28, 1960 Today's Date: 02/15/2024 Time: 9084-9061 SLP Time Calculation (min) (ACUTE ONLY): 23 min  Assessment / Plan / Recommendation Clinical Impression  Excellent session this am, staff doing a good job motivating pt with meals and helping her with wants and needs. Pt needs distractions eliminated (door closed, no noise, all items off tray other than food and one utensil) to attend to self feeding. SLP often redirecting her to task due to internal distractors. Pt does express increasing awareness of situation with frequent corrections needed. Knows she's been here a long time, but needs frequent reminders of how long and for what reason. Increasing awareness of date and need to recall it for providers who enter to the room. Pt able to study board and find date over repeated trials. Wants to set and meet goals to leave. We talked about eating meals and working with PT/OT for mobility. Repeating these simple goals may help pt stay motivated. She also expresses boredom, loneliness and a desire to get fresh air. Will bring activity to engage pt next session.   HPI HPI: A 64 yr old female patient with polysubstance/cocaine abuse 3 hrs before presentation, who called EMS for AMS, lethargy and near syncope. Her condition deteriorated (unable to protect her airway and more lethargic) and got intubated. 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 HTN, CHF, DM-2.      SLP Plan  Continue with current plan of care          Recommendations                         Frequent or constant Supervision/Assistance Cognitive communication deficit (M58.158)     Continue with current plan of care      Norine Reddington, Consuelo Fitch  02/15/2024, 9:53 AM

## 2024-02-15 NOTE — Progress Notes (Signed)
 Occupational Therapy Treatment Patient Details Name: Amanda [name] MRN: [id number] DOB: 27-May-1960 Today's Date: 02/15/2024   History of present illness 63 yo female presents to [facility] on 9/22 with lethargy and near-syncope s/p cocaine and opioid ingestion. 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 S/p R frontal ventriculostomy, burr holes 9/22. ETT 9/22-9/23. PMHx of cocaine abuse, CHF, CKD, chronic cough, HTN and DM2.   OT comments  Patient received in supine and eager to participate with therapy. Patient demonstrating good gains with bed mobility, self care, and transfers. Patient requiring min assist +2 for sit to stands and mod assist +2 for standing at sink for self care. Patient progressed during session with requiring less assistance for balance and correct walker use.  Patient will benefit from continued inpatient follow up therapy, <3 hours/day.  Acute OT to continue to follow to address established goals to facilitate DC to next venue of care.        If plan is discharge home, recommend the following:  Two people to help with walking and/or transfers;A lot of help with bathing/dressing/bathroom;Assistance with cooking/housework;Direct supervision/assist for medications management;Direct supervision/assist for financial management;Assist for transportation;Help with stairs or ramp for entrance   Equipment Recommendations  Other (comment) (defer)    Recommendations for Other Services      Precautions / Restrictions Precautions Precautions: Fall;Other (comment) Recall of Precautions/Restrictions: Impaired Precaution/Restrictions Comments: PEG, SBP < 160, impulsive, irritable, labile Restrictions Weight Bearing Restrictions Per Provider Order: No       Mobility Bed Mobility Overal bed mobility: Needs Assistance Bed Mobility: Supine to Sit, Sit to Supine     Supine to sit: Min  assist, HOB elevated Sit to supine: Contact guard assist   General bed mobility comments: patient able to manage BLEs to get to EOB and required assistance for balance    Transfers Overall transfer level: Needs assistance Equipment used: Rolling walker (2 wheels), None Transfers: Sit to/from Stand Sit to Stand: Min assist, Mod assist, +2 physical assistance, +2 safety/equipment           General transfer comment: STS with min A +2 progressing to mod A +1 due to posterior bias     Balance Overall balance assessment: Needs assistance Sitting-balance support: Feet supported, Single extremity supported, Bilateral upper extremity supported Sitting balance-Leahy Scale: Poor Sitting balance - Comments: CGA sitting EOB with posterior lean initially, but pt able to correct with cues   Standing balance support: Bilateral upper extremity supported, During functional activity, Reliant on assistive device for balance Standing balance-Leahy Scale: Poor Standing balance comment: Pt reliant on RW and external assist due to posterior bias                           ADL either performed or assessed with clinical judgement   ADL Overall ADL's : Needs assistance/impaired     Grooming: Wash/dry hands;Wash/dry face;Minimal assistance;Sitting;Standing Grooming Details (indicate cue type and reason): attempted in standing at sink but had increased posterior leaning and compeleted in sitting         Upper Body Dressing : Minimal assistance;Sitting Upper Body Dressing Details (indicate cue type and reason): donned socks in long sitting in bed     Toilet Transfer: Minimal assistance;+2 for physical assistance;Moderate assistance;Regular Teacher, Adult Education Details (indicate cue type and reason): +2 HHA to ambulate to bathroom with education on rail use for balance Toileting- Clothing Manipulation and Hygiene: Total  assistance;Sit to/from stand Toileting - Clothing Manipulation  Details (indicate cue type and reason): patient reliant on BUE support while standing and was total assist for toilet hygiene and clothing management       General ADL Comments: patient with min assist +2 for standing during self care tasks    Extremity/Trunk Assessment              Vision       Perception     Praxis     Communication Communication Communication: Impaired Factors Affecting Communication: Reduced clarity of speech   Cognition Arousal: Alert Behavior During Therapy: Impulsive, Restless Cognition: Cognition impaired   Orientation impairments: Time, Place Awareness: Intellectual awareness impaired Memory impairment (select all impairments): Short-term memory, Working memory Attention impairment (select first level of impairment): Sustained attention Executive functioning impairment (select all impairments): Initiation, Sequencing, Reasoning, Problem solving OT - Cognition Comments: more aware of her impairments                 Following commands: Impaired Following commands impaired: Follows one step commands inconsistently, Follows one step commands with increased time      Cueing   Cueing Techniques: Verbal cues, Tactile cues, Visual cues, Gestural cues  Exercises      Shoulder Instructions       General Comments      Pertinent Vitals/ Pain       Pain Assessment Pain Assessment: Faces Faces Pain Scale: No hurt  Home Living                                          Prior Functioning/Environment              Frequency  Min 1X/week        Progress Toward Goals  OT Goals(current goals can now be found in the care plan section)  Progress towards OT goals: Progressing toward goals  Acute Rehab OT Goals Patient Stated Goal: to walk more OT Goal Formulation: With patient Time For Goal Achievement: 02/22/24 Potential to Achieve Goals: Fair ADL Goals Pt Will Perform Eating: with min assist;sitting;bed  level Pt Will Perform Grooming: with min assist;sitting Pt Will Transfer to Toilet: with mod assist;squat pivot transfer;stand pivot transfer;bedside commode Additional ADL Goal #1: Pt will attend to items on L side 50% of the time, in preparation for ADLs at EOB. Additional ADL Goal #2: Pt will follow 1-step commands 75% of the time during ADLs and functional mobility to demonstrate improved attention.  Plan      Co-evaluation    PT/OT/SLP Co-Evaluation/Treatment: Yes Reason for Co-Treatment: Complexity of the patient's impairments (multi-system involvement);Necessary to address cognition/behavior during functional activity;For patient/therapist safety;To address functional/ADL transfers;Other (comment) PT goals addressed during session: Mobility/safety with mobility;Balance;Strengthening/ROM OT goals addressed during session: ADL's and self-care      AM-PAC OT 6 Clicks Daily Activity     Outcome Measure   Help from another person eating meals?: A Little Help from another person taking care of personal grooming?: A Lot Help from another person toileting, which includes using toliet, bedpan, or urinal?: A Lot Help from another person bathing (including washing, rinsing, drying)?: A Lot Help from another person to put on and taking off regular upper body clothing?: A Lot Help from another person to put on and taking off regular lower body clothing?: A Lot 6 Click Score: 13  End of Session Equipment Utilized During Treatment: Gait belt;Rolling walker (2 wheels)  OT Visit Diagnosis: Unsteadiness on feet (R26.81);Other abnormalities of gait and mobility (R26.89);Muscle weakness (generalized) (M62.81)   Activity Tolerance Patient tolerated treatment well   Patient Left in bed;with call bell/phone within reach;with bed alarm set   Nurse Communication Mobility status        Time: 9068-8993 OT Time Calculation (min): 35 min  Charges: OT General Charges $OT Visit: 1 Visit OT  Treatments $Self Care/Home Management : 8-22 mins  Dick Laine, OTA Acute Rehabilitation Services  Office [phone]   Jeb LITTIE Laine 02/15/2024, 1:31 PM

## 2024-02-15 NOTE — Progress Notes (Signed)
 [facility]                                                                               Amanda [name], is a 63 y.o. female, DOB - 20-Dec-1960, FMW:[id number] Admit date - 01/03/2024    Outpatient Primary MD for the patient is Patient, No Pcp Per  LOS - 43  days    Brief summary   Patient is a 63 year old female with history of hypertension, cocaine abuse, diabetes type 2 who presented with lethargy, near syncope.  Found to be severely hypertensive, UDS positive for cocaine, with acute intraparenchymal hemorrhage , intraventricular hemorrhage and hydrocephalus.  Patient was admitted to ICU for close blood pressure management.  Neurosurgery consulted.  Status post right frontal ventriculostomy. EVD eventually removed on 10/3. Hospital course remarkable for dysphagia.  Speech therapy following.  Dietitian/speech therapy recommending PEG placement because she is not able to tolerate anything by mouth.  PT/OT recommending SNF on discharge.  Underwent PEG placement by IR on 10/16. TOC following    Significant events: 9/22 Admitted and intubated, emergent burr holes and right frontal ventriculostomy placed by neurosurgery due to neurological worsening 9/23 extubated 10/1 EVD clamped 10/3 EVD removed 10/16 PEG placement by IR for worsening dysphagia 11/3 no new events overnight.    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  EVD removed on 10/3 Staples on the scalp removed on 02/01/2024. Patient denies any new complaints.    Hypertension:  BP parameters are optimal.    Dysphagia S/p PEG placement on 10/16.   Now has progressed to dysphagia 3 diet but not having enough p.o. intake to meet nutritional needs of the body and thus she is still on PEG tube feedings as well.    Hospital delirium: Mostly starts getting agitated  in the late afternoon.  On Seroquel  and soft restraints prn.     Chronic combined systolic and diastolic CHF Resume entresto  and spironolactone .  Not on aspirin  due to recent hemorrhage.     Cocaine abuse Toc on board for cocaine cessation resources.   No active issues, patient ambulating using walker. waiting for SNF.      Malnutrition Type:  Nutrition Problem: Moderate Malnutrition Etiology: social / environmental circumstances (polysubstance abuse)   Malnutrition Characteristics:  Signs/Symptoms: mild muscle depletion, moderate fat depletion, mild fat depletion, moderate muscle depletion   Nutrition Interventions:  Interventions: Prostat, Tube feeding  Estimated body mass index is 20.62 kg/m as calculated from the following:   Height as of this encounter: 5' 5 (1.651 m).   Weight as of this encounter: 56.2 kg.  Code Status: full code.  DVT Prophylaxis:  heparin  injection 5,000 Units Start: 01/05/24 1015 Place and maintain sequential compression device Start: 01/03/24 0611 SCDs Start: 01/03/24 0429   Level of Care: Level of care: Progressive Family Communication: none at bedside.   Disposition Plan:     Remains inpatient appropriate:  pending SNF.   Procedures:  None.   Consultants:   None.   Antimicrobials:   Anti-infectives (From admission, onward)  Start     Dose/Rate Route Frequency Ordered Stop   01/27/24 0807  ceFAZolin (ANCEF) IVPB 2g/100 mL premix        over 30 Minutes Intravenous Continuous PRN 01/27/24 0807 01/27/24 0807   01/13/24 0600  ceFAZolin (ANCEF) IVPB 2g/100 mL premix        2 g 200 mL/hr over 30 Minutes Intravenous On call to O.R. 01/11/24 2300 01/13/24 0557        Medications  Scheduled Meds:  carvedilol   25 mg Oral BID WC   feeding supplement (PROSource TF20)  60 mL Per Tube Daily   free water   60 mL Per Tube Q4H   heparin  injection (subcutaneous)  5,000 Units Subcutaneous Q8H   insulin  aspart  0-9 Units  Subcutaneous Q4H   mineral oil-hydrophilic petrolatum    Topical BID   mouth rinse  15 mL Mouth Rinse 4 times per day   QUEtiapine   25 mg Oral QHS   sacubitril -valsartan   1 tablet Oral BID   spironolactone   25 mg Oral Daily   Continuous Infusions:  feeding supplement (OSMOLITE 1.5 CAL) 1,000 mL (02/14/24 0213)   PRN Meds:.acetaminophen  **OR** acetaminophen  (TYLENOL ) oral liquid 160 mg/5 mL **OR** acetaminophen , benzonatate , hydrALAZINE , ipratropium-albuterol , labetalol , menthol, OLANZapine , mouth rinse, polyethylene glycol, white petrolatum     Subjective:   Amanda [name] was seen and examined today.  No new complaints.   Objective:   Vitals:   02/15/24 0000 02/15/24 0419 02/15/24 0446 02/15/24 0757  BP: 130/82 (!) 150/89  134/88  Pulse: 68 69  72  Resp: 16   18  Temp: 97.8 F (36.6 C)   98.6 F (37 C)  TempSrc: Oral     SpO2:    97%  Weight:   56.2 kg   Height:        Intake/Output Summary (Last 24 hours) at 02/15/2024 1115 Last data filed at 02/15/2024 0907 Gross per 24 hour  Intake 600 ml  Output 350 ml  Net 250 ml   Filed Weights   02/13/24 0500 02/14/24 0433 02/15/24 0446  Weight: 55 kg 55.6 kg 56.2 kg     Exam General exam: Appears calm and comfortable  Respiratory system: Clear to auscultation. Respiratory effort normal. Cardiovascular system: S1 & S2 heard, RRR. No JVD,  Gastrointestinal system: Abdomen is nondistended, soft and nontender.  Central nervous system: Alert and oriented.  Extremities: Symmetric 5 x 5 power. Skin: No rashes,  Psychiatry:  Mood & affect appropriate.     Data Reviewed:  I have personally reviewed following labs and imaging studies   CBC Lab Results  Component Value Date   WBC 7.2 01/29/2024   RBC 4.63 01/29/2024   HGB 13.3 01/29/2024   HCT 40.1 01/29/2024   MCV 86.6 01/29/2024   MCH 28.7 01/29/2024   PLT 284 01/29/2024   MCHC 33.2 01/29/2024   RDW 14.2 01/29/2024   LYMPHSABS 1.8 01/15/2024   MONOABS 0.9  01/15/2024   EOSABS 0.0 01/15/2024   BASOSABS 0.0 01/15/2024     Last metabolic panel Lab Results  Component Value Date   NA 135 01/29/2024   K 4.0 01/29/2024   CL 99 01/29/2024   CO2 24 01/29/2024   BUN 36 (H) 01/29/2024   CREATININE 0.86 01/29/2024   GLUCOSE 173 (H) 01/29/2024   GFRNONAA >60 01/29/2024   GFRAA >90 01/21/2014   CALCIUM 9.9 01/29/2024   PHOS 4.1 01/21/2024   PROT 7.0 01/03/2024   ALBUMIN 2.9 (L) 01/03/2024   LABGLOB 3.6 11/15/2023  BILITOT 0.6 01/03/2024   ALKPHOS 66 01/03/2024   AST 24 01/03/2024   ALT 14 01/03/2024   ANIONGAP 12 01/29/2024    CBG (last 3)  Recent Labs    02/14/24 2337 02/15/24 0412 02/15/24 0758  GLUCAP 119* 150* 85      Coagulation Profile: No results for input(s): INR, PROTIME in the last 168 hours.   Radiology Studies: No results found.     Elgie Butter M.D. [facility] 02/15/2024, 11:15 AM  Available via Epic secure chat 7am-7pm After 7 pm, please refer to night coverage provider listed on amion.

## 2024-02-15 NOTE — Plan of Care (Signed)
 Problem: Coping: Goal: Ability to adjust to condition or change in health will improve 02/15/2024 0557 by Cesario Martina MANIFOLD, RN Outcome: Progressing 02/15/2024 0059 by Cesario Martina MANIFOLD, RN Outcome: Progressing   Problem: Fluid Volume: Goal: Ability to maintain a balanced intake and output will improve 02/15/2024 0557 by Cesario Martina MANIFOLD, RN Outcome: Progressing 02/15/2024 0059 by Cesario Martina MANIFOLD, RN Outcome: Progressing   Problem: Health Behavior/Discharge Planning: Goal: Ability to manage health-related needs will improve 02/15/2024 0557 by Cesario Martina MANIFOLD, RN Outcome: Progressing 02/15/2024 0059 by Cesario Martina MANIFOLD, RN Outcome: Progressing   Problem: Metabolic: Goal: Ability to maintain appropriate glucose levels will improve 02/15/2024 0557 by Cesario Martina MANIFOLD, RN Outcome: Progressing 02/15/2024 0059 by Cesario Martina MANIFOLD, RN Outcome: Progressing   Problem: Nutritional: Goal: Maintenance of adequate nutrition will improve 02/15/2024 0557 by Cesario Martina MANIFOLD, RN Outcome: Progressing 02/15/2024 0059 by Cesario Martina MANIFOLD, RN Outcome: Progressing Goal: Progress toward achieving an optimal weight will improve 02/15/2024 0557 by Cesario Martina MANIFOLD, RN Outcome: Progressing 02/15/2024 0059 by Cesario Martina MANIFOLD, RN Outcome: Progressing   Problem: Skin Integrity: Goal: Risk for impaired skin integrity will decrease 02/15/2024 0557 by Cesario Martina MANIFOLD, RN Outcome: Progressing 02/15/2024 0059 by Cesario Martina MANIFOLD, RN Outcome: Progressing   Problem: Tissue Perfusion: Goal: Adequacy of tissue perfusion will improve 02/15/2024 0557 by Cesario Martina MANIFOLD, RN Outcome: Progressing 02/15/2024 0059 by Cesario Martina MANIFOLD, RN Outcome: Progressing   Problem: Education: Goal: Knowledge of General Education information will improve Description: Including pain rating scale, medication(s)/side effects and non-pharmacologic comfort measures Outcome: Progressing   Problem: Health Behavior/Discharge Planning: Goal: Ability to manage health-related needs will improve Outcome:  Progressing   Problem: Clinical Measurements: Goal: Ability to maintain clinical measurements within normal limits will improve Outcome: Progressing Goal: Will remain free from infection Outcome: Progressing Goal: Diagnostic test results will improve Outcome: Progressing Goal: Respiratory complications will improve Outcome: Progressing Goal: Cardiovascular complication will be avoided Outcome: Progressing   Problem: Activity: Goal: Risk for activity intolerance will decrease Outcome: Progressing   Problem: Nutrition: Goal: Adequate nutrition will be maintained Outcome: Progressing   Problem: Coping: Goal: Level of anxiety will decrease Outcome: Progressing   Problem: Elimination: Goal: Will not experience complications related to bowel motility Outcome: Progressing Goal: Will not experience complications related to urinary retention Outcome: Progressing   Problem: Pain Managment: Goal: General experience of comfort will improve and/or be controlled Outcome: Progressing   Problem: Safety: Goal: Ability to remain free from injury will improve Outcome: Progressing   Problem: Skin Integrity: Goal: Risk for impaired skin integrity will decrease Outcome: Progressing   Problem: Education: Goal: Knowledge of disease or condition will improve Outcome: Progressing Goal: Knowledge of secondary prevention will improve (MUST DOCUMENT ALL) Outcome: Progressing Goal: Knowledge of patient specific risk factors will improve (DELETE if not current risk factor) Outcome: Progressing   Problem: Intracerebral Hemorrhage Tissue Perfusion: Goal: Complications of Intracerebral Hemorrhage will be minimized Outcome: Progressing   Problem: Coping: Goal: Will verbalize positive feelings about self Outcome: Progressing Goal: Will identify appropriate support needs Outcome: Progressing   Problem: Health Behavior/Discharge Planning: Goal: Ability to manage health-related needs will  improve Outcome: Progressing Goal: Goals will be collaboratively established with patient/family Outcome: Progressing   Problem: Self-Care: Goal: Ability to participate in self-care as condition permits will improve Outcome: Progressing Goal: Verbalization of feelings and concerns over difficulty with self-care will improve Outcome: Progressing Goal: Ability to communicate needs accurately will improve Outcome: Progressing   Problem: Nutrition:  Goal: Risk of aspiration will decrease Outcome: Progressing Goal: Dietary intake will improve Outcome: Progressing

## 2024-02-16 DIAGNOSIS — I610 Nontraumatic intracerebral hemorrhage in hemisphere, subcortical: Secondary | ICD-10-CM | POA: Diagnosis not present

## 2024-02-16 LAB — GLUCOSE, CAPILLARY
Glucose-Capillary: 103 mg/dL — ABNORMAL HIGH (ref 70–99)
Glucose-Capillary: 122 mg/dL — ABNORMAL HIGH (ref 70–99)
Glucose-Capillary: 126 mg/dL — ABNORMAL HIGH (ref 70–99)
Glucose-Capillary: 127 mg/dL — ABNORMAL HIGH (ref 70–99)
Glucose-Capillary: 134 mg/dL — ABNORMAL HIGH (ref 70–99)

## 2024-02-16 NOTE — Plan of Care (Signed)
  Problem: Health Behavior/Discharge Planning: Goal: Ability to manage health-related needs will improve Outcome: Progressing   Problem: Nutritional: Goal: Maintenance of adequate nutrition will improve Outcome: Progressing   Problem: Skin Integrity: Goal: Risk for impaired skin integrity will decrease Outcome: Progressing   Problem: Tissue Perfusion: Goal: Adequacy of tissue perfusion will improve Outcome: Progressing   Problem: Education: Goal: Knowledge of General Education information will improve Description: Including pain rating scale, medication(s)/side effects and non-pharmacologic comfort measures Outcome: Progressing   Problem: Clinical Measurements: Goal: Respiratory complications will improve Outcome: Progressing Goal: Cardiovascular complication will be avoided Outcome: Progressing   Problem: Activity: Goal: Risk for activity intolerance will decrease Outcome: Progressing   Problem: Nutrition: Goal: Adequate nutrition will be maintained Outcome: Progressing   Problem: Coping: Goal: Will verbalize positive feelings about self Outcome: Progressing Goal: Will identify appropriate support needs Outcome: Progressing

## 2024-02-16 NOTE — Progress Notes (Signed)
 PROGRESS NOTE    Amanda [name]  FMW:[id number] DOB: 08-02-1960 DOA: 01/03/2024 PCP: Amanda [name], No Pcp Per   Brief Narrative:  Amanda [name] is a 63 year old female with history of hypertension, cocaine abuse, diabetes type 2 who presented with lethargy, near syncope.  Found to be severely hypertensive, UDS positive for cocaine, with acute intraparenchymal hemorrhage , intraventricular hemorrhage and hydrocephalus.  Amanda [name] was admitted to ICU for close blood pressure management.  Neurosurgery consulted.  Status post right frontal ventriculostomy. EVD eventually removed on 10/3. Hospital course remarkable for dysphagia.  Speech therapy following.  Dietitian/speech therapy recommending PEG placement because she is not able to tolerate anything by mouth.  PT/OT recommending SNF on discharge.  Underwent PEG placement by IR on 10/16. TOC following    Significant events: 9/22 Admitted and intubated, emergent burr holes and right frontal ventriculostomy placed by neurosurgery due to neurological worsening 9/23 extubated 10/1 EVD clamped 10/3 EVD removed 10/16 PEG placement by IR for worsening dysphagia  Assessment & Plan:   Principal Problem:   ICH (intracerebral hemorrhage) (HCC) Active Problems:   Hypertensive emergency   Tobacco abuse   Cocaine abuse (HCC)   History of alcohol abuse   Chronic combined systolic and diastolic heart failure (HCC)   HTN (hypertension)   Hemorrhagic stroke (HCC)   Malnutrition of moderate degree  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 Continues to receive PT. Plan for discharge to SNF.  EVD removed 01/14/2024, scalp staples removed 02/01/2024. Still pending bed offer.   Hypertension: Currently on carvedilol  25 mg twice daily, Entresto .  Amanda [name]'s systolic blood pressure has been mostly under 120 and diastolic under 80.  Amlodipine  discontinued due to  history of systolic CHF and blood pressure now optimal.   Dysphagia: S/P PEG placement on 10/16. Now has progressed to dysphagia 3 diet but not having enough p.o. intake to meet nutritional needs of the body and thus she is still on PEG tube feedings as well.    Chronic combined systolic/diastolic CHF: Stable, continue Entresto  and spironolactone .  Not on aspirin  due to recent hemorrhage.    Type 2 diabetes: Recent A1c of 6.6. Glucose controlled on sliding scale insulin .   Cocaine abuse: Consulted TOC for cocaine cessation resources.  Delirium/fall: On Seroquel  and as needed soft restraints.  However she has not required any soft restraints since 02/13/2024 per nursing reports. 1. Avoid benzodiazepines, antihistamines, anticholinergics, and minimize opiate use as these may worsen delirium. 2: Assess, prevent and manage pain as lack of treatment can result in delirium.  3: Provide appropriate lighting and clear signage; a clock and calendar should be easily visible to the Amanda [name]. 4: Monitor environmental factors. Reduce light and noise at night (close shades, turn off lights, turn off TV, ect). Correct any alterations in sleep cycle. 5: Reorient the Amanda [name] to person, place, time and situation on each encounter.  6: Correct sensory deficits if possible (replace eye glasses, hearing aids, ect). 7: Avoid restraints if able. Severely delirious patients benefit from constant observation by a sitter.  DVT prophylaxis: heparin  injection 5,000 Units Start: 01/05/24 1015 Place and maintain sequential compression device Start: 01/03/24 0611 SCDs Start: 01/03/24 0429   Code Status: Full Code  Family Communication:  None present at bedside.   Status is: Inpatient Remains inpatient appropriate because: Medically stable, pending placement.   Estimated body mass index is 20.47 kg/m as calculated from the following:   Height as of this encounter: 5'  5 (1.651 m).   Weight as of this encounter: 55.8  kg.    Nutritional Assessment: Body mass index is 20.47 kg/m.[name] [name].  I agree with the assessment and plan as outlined below: Nutrition Status: Nutrition Problem: Moderate Malnutrition Etiology: social / environmental circumstances (polysubstance abuse) Signs/Symptoms: mild muscle depletion, moderate fat depletion, mild fat depletion, moderate muscle depletion Interventions: Prostat, Tube feeding  . Skin Assessment: I have examined the Amanda [name]'s skin and I agree with the wound assessment as performed by the wound care RN as outlined below:    Consultants:  Neurosurgery and IR  Procedures:  As above  Antimicrobials:  Anti-infectives (From admission, onward)    Start     Dose/Rate Route Frequency Ordered Stop   01/27/24 0807  ceFAZolin (ANCEF) IVPB 2g/100 mL premix        over 30 Minutes Intravenous Continuous PRN 01/27/24 0807 01/27/24 0807   01/13/24 0600  ceFAZolin (ANCEF) IVPB 2g/100 mL premix        2 g 200 mL/hr over 30 Minutes Intravenous On call to O.R. 01/11/24 2300 01/13/24 0557         Subjective: Amanda [name] seen and examined.  Very pleasant, fully alert and oriented and has no complaints.  She was not in restraints today.  Objective: Vitals:   02/15/24 2007 02/16/24 0017 02/16/24 0417 02/16/24 0500  BP: (!) 107/90 (!) 134/91 (!) 153/96   Pulse: 81 70 63   Resp: 18 16 16    Temp: 98.2 F (36.8 C) 98.4 F (36.9 C)    TempSrc: Oral Oral    SpO2:  97%    Weight:    55.8 kg  Height:        Intake/Output Summary (Last 24 hours) at 02/16/2024 0732 Last data filed at 02/16/2024 0400 Gross per 24 hour  Intake 4021.33 ml  Output 350 ml  Net 3671.33 ml   Filed Weights   02/14/24 0433 02/15/24 0446 02/16/24 0500  Weight: 55.6 kg 56.2 kg 55.8 kg    Examination:  General exam: Appears calm and comfortable  Respiratory system: Clear to auscultation. Respiratory effort normal. Cardiovascular system: S1 & S2 heard, RRR. No JVD, murmurs, rubs,  gallops or clicks. No pedal edema. Gastrointestinal system: Abdomen is nondistended, soft and nontender. No organomegaly or masses felt. Normal bowel sounds heard. Central nervous system: Alert and oriented x 2.  Left hemiparesis. Extremities: Symmetric 5 x 5 power.   Data Reviewed: I have personally reviewed following labs and imaging studies  CBC: No results for input(s): WBC, NEUTROABS, HGB, HCT, MCV, PLT in the last 168 hours.  Basic Metabolic Panel: No results for input(s): NA, K, CL, CO2, GLUCOSE, BUN, CREATININE, CALCIUM, MG, PHOS in the last 168 hours.  GFR: Estimated Creatinine Clearance: 59.7 mL/min (by C-G formula based on SCr of 0.86 mg/dL). Liver Function Tests: No results for input(s): AST, ALT, ALKPHOS, BILITOT, PROT, ALBUMIN in the last 168 hours. No results for input(s): LIPASE, AMYLASE in the last 168 hours. No results for input(s): AMMONIA in the last 168 hours. Coagulation Profile: No results for input(s): INR, PROTIME in the last 168 hours.  Cardiac Enzymes: No results for input(s): CKTOTAL, CKMB, CKMBINDEX, TROPONINI in the last 168 hours. BNP (last 3 results) No results for input(s): PROBNP in the last 8760 hours. HbA1C: No results for input(s): HGBA1C in the last 72 hours. CBG: Recent Labs  Lab 02/15/24 0758 02/15/24 1127 02/15/24 1458 02/15/24 2058 02/16/24 0012  GLUCAP 85 128* 112* 108*  127*   Lipid Profile: No results for input(s): CHOL, HDL, LDLCALC, TRIG, CHOLHDL, LDLDIRECT in the last 72 hours. Thyroid Function Tests: No results for input(s): TSH, T4TOTAL, FREET4, T3FREE, THYROIDAB in the last 72 hours. Anemia Panel: No results for input(s): VITAMINB12, FOLATE, FERRITIN, TIBC, IRON, RETICCTPCT in the last 72 hours. Sepsis Labs: No results for input(s): PROCALCITON, LATICACIDVEN in the last 168 hours.  No results found for this or any  previous visit (from the past 240 hours).   Radiology Studies: No results found.  Scheduled Meds:  carvedilol   25 mg Oral BID WC   feeding supplement (PROSource TF20)  60 mL Per Tube Daily   free water   60 mL Per Tube Q4H   heparin  injection (subcutaneous)  5,000 Units Subcutaneous Q8H   insulin  aspart  0-9 Units Subcutaneous Q4H   mineral oil-hydrophilic petrolatum    Topical BID   mouth rinse  15 mL Mouth Rinse 4 times per day   QUEtiapine   25 mg Oral QHS   sacubitril -valsartan   1 tablet Oral BID   spironolactone   25 mg Oral Daily   Continuous Infusions:  feeding supplement (OSMOLITE 1.5 CAL) 1,000 mL (02/14/24 0213)     LOS: 44 days   Fredia Skeeter, MD [facility]  02/16/2024, 7:32 AM   *Please note that this is a verbal dictation therefore any spelling or grammatical errors are due to the Dragon Medical One system interpretation.  Please page via Amion and do not message via secure chat for urgent Amanda [name] care matters. Secure chat can be used for non urgent Amanda [name] care matters.  How to contact the [facility] Attending or Consulting provider 7A - 7P or covering provider during after hours 7P -7A, for this Amanda [name]?  Check the care team in [facility] and look for a) attending/consulting [facility] provider listed and b) the [facility] team listed. Page or secure chat 7A-7P. Log into [email or web address] and use [facility]'s universal password to access. If you do not have the password, please contact the hospital operator. Locate the [facility] provider you are looking for under [facility] and page to a number that you can be directly reached. If you still have difficulty reaching the provider, please page the [facility] (Director on Call) for the Hospitalists listed on amion for assistance.

## 2024-02-16 NOTE — Progress Notes (Signed)
 Nutrition Follow-up  DOCUMENTATION CODES:  Non-severe (moderate) malnutrition in context of social or environmental circumstances (polysubstance abuse)  INTERVENTION:  Continue tube feeding via PEG: Osmolite 1.5 at 40 ml/h (960 ml per day) Prosource TF20 60 ml 1x/d Free water : 60mL every 4 hours Provides 1520 kcal, 80 gm protein, 731 ml free water  daily (1091mL flush + TF) Continue current diet as ordered per SLP Encourage PO intake  When ready to discharge, adjust to bolus feeds: Osmolite 1.5, administer 1 carton 5x/d (5 cartons total per day) Administer 1/2 carton of formula for first two feeds and then increase to a full carton Free water : 50mL before and after each feed ( 5x/d) This will provide 1775 kcal, 75g protein, and of water  per day ( free water  TF+flush) Calorie Count x 48 hours to determine if pt is meeting nutrition needs  NUTRITION DIAGNOSIS:  Moderate Malnutrition related to social / environmental circumstances (polysubstance abuse) as evidenced by mild muscle depletion, moderate fat depletion, mild fat depletion, moderate muscle depletion. - remains applicable  GOAL:  Patient will meet greater than or equal to 90% of their needs - met with TF at goal  MONITOR:  TF tolerance, Diet advancement, PO intake, Weight trends, Labs  REASON FOR ASSESSMENT:  Consult Enteral/tube feeding initiation and management  ASSESSMENT:  Pt with hx HTN, CHF, type 2 diabetes, polysubstance abuse, and pulmonary edema. Admitted with AMS, lethargy, and deteriorating condition requiring intubation; diagnosed ICH.  9/22 - admitted, intubated, EVD placed 9/23 - extubated 9/24 - s/p cortrak placement; tip gastric  9/25 - Diet advanced to Dysphagia 2/thin; TF stopped 9/26 - back down to Full liquids, TF resumed  10/13- upgraded to DYS 3, but SLP recommends PEG 10/16 - PEG placed in IR  Pt resting in bed at the time of assessment. Much more awake and interactive than at  last visit. Pt reports that she is eating well, but she is also stating she is leaving tomorrow and needs to get up and exercise so unsure how well of a historian she is. Meal intake trends do seem to be improving. Will start kcal count to determine if nutrition regimen can be adjusted to nocturnal. Discussed with MD and RN.  Admit weight: 56.1 kg  Current weight: 55.8 kg    Average Meal Intake: 10/6-10/8: 0% intake x 4 recorded meals 10/6-10/12: 0% intake x 7 recorded meals 10/14-10/15: 14% x 4 recorded meals 10/24-11/4: 57% intake x 6 recorded meals  Nutritionally Relevant Medications: Scheduled Meds:  PROSource TF20  60 mL Per Tube Daily   free water   60 mL Per Tube Q4H   insulin  aspart  0-9 Units Subcutaneous Q4H   spironolactone   25 mg Oral Daily   Continuous Infusions:  feeding supplement (OSMOLITE 1.5 CAL) 1,000 mL (02/16/24 1249)   PRN Meds: polyethylene glycol  Labs Reviewed: CBG ranges from 85-150 mg/dL over the last 24 hours HgbA1c 6.6%  Diet Order:   Diet Order             DIET DYS 3 Room service appropriate? Yes; Fluid consistency: Thin  Diet effective now                  EDUCATION NEEDS:  Not appropriate for education at this time  Skin:  Skin Assessment: Skin Integrity Issues: Skin Integrity Issues:: Incisions Incisions: closed surgical incision head  Last BM:  11/2 - type 5  Height:  Ht Readings from Last 1 Encounters:  01/03/24 5' 5 (1.651  m)    Weight:  Wt Readings from Last 1 Encounters:  02/16/24 55.8 kg    Ideal Body Weight:  56.8 kg  BMI:  Body mass index is 20.47 kg/m.  Estimated Nutritional Needs:  Kcal:  1500-1700 Protein:  80-90 grams Fluid:  1.5-1.7L   Vernell Lukes, RD, LDN, CNSC Registered Dietitian II Please reach out via secure chat

## 2024-02-16 NOTE — Plan of Care (Addendum)
 Pt refused to be checked with CBG. MD Howerter made aware. She also refused for Heparin  SQ as well as bedside care. Pt is asleep and became irritable when disturbed and shouted at [employer].  Problem: Health Behavior/Discharge Planning: Goal: Ability to manage health-related needs will improve Outcome: Progressing   Problem: Nutritional: Goal: Maintenance of adequate nutrition will improve Outcome: Progressing   Problem: Nutritional: Goal: Progress toward achieving an optimal weight will improve Outcome: Progressing   Problem: Skin Integrity: Goal: Risk for impaired skin integrity will decrease Outcome: Progressing   Problem: Health Behavior/Discharge Planning: Goal: Ability to manage health-related needs will improve Outcome: Progressing   Problem: Activity: Goal: Risk for activity intolerance will decrease Outcome: Progressing   Problem: Nutrition: Goal: Adequate nutrition will be maintained Outcome: Progressing   Problem: Coping: Goal: Level of anxiety will decrease Outcome: Progressing   Problem: Safety: Goal: Ability to remain free from injury will improve Outcome: Progressing   Problem: Skin Integrity: Goal: Risk for impaired skin integrity will decrease Outcome: Progressing

## 2024-02-17 DIAGNOSIS — I610 Nontraumatic intracerebral hemorrhage in hemisphere, subcortical: Secondary | ICD-10-CM | POA: Diagnosis not present

## 2024-02-17 LAB — GLUCOSE, CAPILLARY
Glucose-Capillary: 117 mg/dL — ABNORMAL HIGH (ref 70–99)
Glucose-Capillary: 132 mg/dL — ABNORMAL HIGH (ref 70–99)
Glucose-Capillary: 138 mg/dL — ABNORMAL HIGH (ref 70–99)
Glucose-Capillary: 144 mg/dL — ABNORMAL HIGH (ref 70–99)
Glucose-Capillary: 148 mg/dL — ABNORMAL HIGH (ref 70–99)
Glucose-Capillary: 159 mg/dL — ABNORMAL HIGH (ref 70–99)

## 2024-02-17 MED ORDER — ENSURE PLUS HIGH PROTEIN PO LIQD
237.0000 mL | Freq: Two times a day (BID) | ORAL | Status: DC
  Administered 2024-02-18 (×2): 237 mL via ORAL

## 2024-02-17 NOTE — Plan of Care (Addendum)
  Problem: Nutritional: Goal: Maintenance of adequate nutrition will improve Outcome: Progressing   Problem: Skin Integrity: Goal: Risk for impaired skin integrity will decrease Outcome: Progressing   Problem: Tissue Perfusion: Goal: Adequacy of tissue perfusion will improve Outcome: Progressing   Problem: Education: Goal: Knowledge of General Education information will improve Description: Including pain rating scale, medication(s)/side effects and non-pharmacologic comfort measures Outcome: Progressing   Problem: Clinical Measurements: Goal: Respiratory complications will improve Outcome: Progressing Goal: Cardiovascular complication will be avoided Outcome: Progressing   Problem: Coping: Goal: Level of anxiety will decrease Outcome: Progressing   Problem: Elimination: Goal: Will not experience complications related to bowel motility Outcome: Progressing Goal: Will not experience complications related to urinary retention Outcome: Progressing

## 2024-02-17 NOTE — Progress Notes (Signed)
 PROGRESS NOTE    Amanda [name]  FMW:[id number] DOB: 11/12/1960 DOA: 01/03/2024 PCP: Patient, No Pcp Per   Brief Narrative:  Patient is a 63 year old female with history of hypertension, cocaine abuse, diabetes type 2 who presented with lethargy, near syncope.  Found to be severely hypertensive, UDS positive for cocaine, with acute intraparenchymal hemorrhage , intraventricular hemorrhage and hydrocephalus.  Patient was admitted to ICU for close blood pressure management.  Neurosurgery consulted.  Status post right frontal ventriculostomy. EVD eventually removed on 10/3. Hospital course remarkable for dysphagia.  Speech therapy following.  Dietitian/speech therapy recommending PEG placement because she is not able to tolerate anything by mouth.  PT/OT recommending SNF on discharge.  Underwent PEG placement by IR on 10/16. TOC following    Significant events: 9/22 Admitted and intubated, emergent burr holes and right frontal ventriculostomy placed by neurosurgery due to neurological worsening 9/23 extubated 10/1 EVD clamped 10/3 EVD removed 10/16 PEG placement by IR for worsening dysphagia  Assessment & Plan:   Principal Problem:   ICH (intracerebral hemorrhage) (HCC) Active Problems:   Hypertensive emergency   Tobacco abuse   Cocaine abuse (HCC)   History of alcohol abuse   Chronic combined systolic and diastolic heart failure (HCC)   HTN (hypertension)   Hemorrhagic stroke (HCC)   Malnutrition of moderate degree  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 Continues to receive PT. Plan for discharge to SNF.  EVD removed 01/14/2024, scalp staples removed 02/01/2024. Still pending bed offer.   Hypertension: Currently on carvedilol  25 mg twice daily, Entresto .  Patient's systolic blood pressure has been mostly under 120 and diastolic under 80.  Amlodipine  discontinued due to  history of systolic CHF and blood pressure now optimal.   Dysphagia: S/P PEG placement on 10/16. Now has progressed to dysphagia 3 diet but not having enough p.o. intake to meet nutritional needs of the body and thus she is still on PEG tube feedings as well.    Chronic combined systolic/diastolic CHF: Stable, continue Entresto  and spironolactone .  Not on aspirin  due to recent hemorrhage.    Type 2 diabetes: Recent A1c of 6.6. Glucose controlled on sliding scale insulin .   Cocaine abuse: Consulted TOC for cocaine cessation resources.  Delirium/fall: On Seroquel  and as needed soft restraints.  However she has not required any soft restraints since 02/13/2024 per nursing reports. 1. Avoid benzodiazepines, antihistamines, anticholinergics, and minimize opiate use as these may worsen delirium. 2: Assess, prevent and manage pain as lack of treatment can result in delirium.  3: Provide appropriate lighting and clear signage; a clock and calendar should be easily visible to the patient. 4: Monitor environmental factors. Reduce light and noise at night (close shades, turn off lights, turn off TV, ect). Correct any alterations in sleep cycle. 5: Reorient the patient to person, place, time and situation on each encounter.  6: Correct sensory deficits if possible (replace eye glasses, hearing aids, ect). 7: Avoid restraints if able. Severely delirious patients benefit from constant observation by a sitter.  DVT prophylaxis: heparin  injection 5,000 Units Start: 01/05/24 1015 Place and maintain sequential compression device Start: 01/03/24 0611 SCDs Start: 01/03/24 0429   Code Status: Full Code  Family Communication:  None present at bedside.   Status is: Inpatient Remains inpatient appropriate because: Medically stable, pending placement.   Estimated body mass index is 20.07 kg/m as calculated from the following:   Height as of this encounter: 5'  5 (1.651 m).   Weight as of this encounter: 54.7  kg.    Nutritional Assessment: Body mass index is 20.07 kg/m.[name] [name].  I agree with the assessment and plan as outlined below: Nutrition Status: Nutrition Problem: Moderate Malnutrition Etiology: social / environmental circumstances (polysubstance abuse) Signs/Symptoms: mild muscle depletion, moderate fat depletion, mild fat depletion, moderate muscle depletion Interventions: Prostat, Tube feeding  . Skin Assessment: I have examined the patient's skin and I agree with the wound assessment as performed by the wound care RN as outlined below:    Consultants:  Neurosurgery and IR  Procedures:  As above  Antimicrobials:  Anti-infectives (From admission, onward)    Start     Dose/Rate Route Frequency Ordered Stop   01/27/24 0807  ceFAZolin (ANCEF) IVPB 2g/100 mL premix        over 30 Minutes Intravenous Continuous PRN 01/27/24 0807 01/27/24 0807   01/13/24 0600  ceFAZolin (ANCEF) IVPB 2g/100 mL premix        2 g 200 mL/hr over 30 Minutes Intravenous On call to O.R. 01/11/24 2300 01/13/24 0557         Subjective: Patient seen and examined.  Fully alert and oriented.  No complaints.  Objective: Vitals:   02/16/24 2017 02/17/24 0000 02/17/24 0500 02/17/24 0533  BP: 112/70   120/88  Pulse: 79   78  Resp: 18 18  20   Temp: 97.7 F (36.5 C)   98 F (36.7 C)  TempSrc: Oral   Oral  SpO2: 96%   100%  Weight:   54.7 kg   Height:        Intake/Output Summary (Last 24 hours) at 02/17/2024 0740 Last data filed at 02/17/2024 0538 Gross per 24 hour  Intake 1720 ml  Output 200 ml  Net 1520 ml   Filed Weights   02/15/24 0446 02/16/24 0500 02/17/24 0500  Weight: 56.2 kg 55.8 kg 54.7 kg    Examination:  General exam: Appears calm and comfortable  Respiratory system: Clear to auscultation. Respiratory effort normal. Cardiovascular system: S1 & S2 heard, RRR. No JVD, murmurs, rubs, gallops or clicks. No pedal edema. Gastrointestinal system: Abdomen is  nondistended, soft and nontender. No organomegaly or masses felt. Normal bowel sounds heard. Central nervous system: Alert and oriented x 2.  Left hemiparesis. Extremities: Symmetric 5 x 5 power.   Data Reviewed: I have personally reviewed following labs and imaging studies  CBC: No results for input(s): WBC, NEUTROABS, HGB, HCT, MCV, PLT in the last 168 hours.  Basic Metabolic Panel: No results for input(s): NA, K, CL, CO2, GLUCOSE, BUN, CREATININE, CALCIUM, MG, PHOS in the last 168 hours.  GFR: Estimated Creatinine Clearance: 58.6 mL/min (by C-G formula based on SCr of 0.86 mg/dL). Liver Function Tests: No results for input(s): AST, ALT, ALKPHOS, BILITOT, PROT, ALBUMIN in the last 168 hours. No results for input(s): LIPASE, AMYLASE in the last 168 hours. No results for input(s): AMMONIA in the last 168 hours. Coagulation Profile: No results for input(s): INR, PROTIME in the last 168 hours.  Cardiac Enzymes: No results for input(s): CKTOTAL, CKMB, CKMBINDEX, TROPONINI in the last 168 hours. BNP (last 3 results) No results for input(s): PROBNP in the last 8760 hours. HbA1C: No results for input(s): HGBA1C in the last 72 hours. CBG: Recent Labs  Lab 02/16/24 1250 02/16/24 1641 02/16/24 1959 02/17/24 0002 02/17/24 0418  GLUCAP 126* 103* 134* 138* 148*   Lipid Profile: No results for input(s): CHOL, HDL, LDLCALC, TRIG,  CHOLHDL, LDLDIRECT in the last 72 hours. Thyroid Function Tests: No results for input(s): TSH, T4TOTAL, FREET4, T3FREE, THYROIDAB in the last 72 hours. Anemia Panel: No results for input(s): VITAMINB12, FOLATE, FERRITIN, TIBC, IRON, RETICCTPCT in the last 72 hours. Sepsis Labs: No results for input(s): PROCALCITON, LATICACIDVEN in the last 168 hours.  No results found for this or any previous visit (from the past 240 hours).   Radiology Studies: No  results found.  Scheduled Meds:  carvedilol   25 mg Oral BID WC   feeding supplement (PROSource TF20)  60 mL Per Tube Daily   free water   60 mL Per Tube Q4H   heparin  injection (subcutaneous)  5,000 Units Subcutaneous Q8H   insulin  aspart  0-9 Units Subcutaneous Q4H   mineral oil-hydrophilic petrolatum    Topical BID   mouth rinse  15 mL Mouth Rinse 4 times per day   QUEtiapine   25 mg Oral QHS   sacubitril -valsartan   1 tablet Oral BID   spironolactone   25 mg Oral Daily   Continuous Infusions:  feeding supplement (OSMOLITE 1.5 CAL) 1,000 mL (02/16/24 1249)     LOS: 45 days   Fredia Skeeter, MD [facility]  02/17/2024, 7:40 AM   *Please note that this is a verbal dictation therefore any spelling or grammatical errors are due to the Dragon Medical One system interpretation.  Please page via Amion and do not message via secure chat for urgent patient care matters. Secure chat can be used for non urgent patient care matters.  How to contact the [facility] Attending or Consulting provider 7A - 7P or covering provider during after hours 7P -7A, for this patient?  Check the care team in [facility] and look for a) attending/consulting [facility] provider listed and b) the [facility] team listed. Page or secure chat 7A-7P. Log into [email or web address] and use [facility] universal password to access. If you do not have the password, please contact the hospital operator. Locate the [facility] provider you are looking for under [facility] and page to a number that you can be directly reached. If you still have difficulty reaching the provider, please page the [facility] (Director on Call) for the Hospitalists listed on amion for assistance.

## 2024-02-17 NOTE — Progress Notes (Signed)
 Calorie Count Note  48-hour calorie count ordered.  Pt with increased alertness and more consistent intake. See below for results x 24 hours. Will follow one more day to ensure consistency and see if feeds can be decreased.  Diet: DYS 3, thin Supplements: none  Estimated Nutritional Needs:  Kcal:  1500-1700 Protein:  80-90 grams Fluid:  1.5-1.7L  11/5 Dinner: 335 kcal, 10g protein 11/6 Breakfast: 558 kcal, 9g protein 11/6 Lunch: 423 kcal, 25g protein  Total intake: 1316 kcal (88% of minimum estimated needs)  44 protein (55% of minimum estimated needs)  NUTRITION DIAGNOSIS:  Moderate Malnutrition related to social / environmental circumstances (polysubstance abuse) as evidenced by mild muscle depletion, moderate fat depletion, mild fat depletion, moderate muscle depletion. - remains applicable   GOAL:  Patient will meet greater than or equal to 90% of their needs - met with TF at goal  INTERVENTION:  Continue tube feeding via PEG: Osmolite 1.5 at 40 ml/h (960 ml per day) Prosource TF20 60 ml 1x/d Free water : 60mL every 4 hours Provides 1520 kcal, 80 gm protein, 731 ml free water  daily ( flush + TF) Continue current diet as ordered per SLP Encourage PO intake  Ensure Plus High Protein po BID, each supplement provides 350 kcal and 20 grams of protein Calorie Count x 48 hours to determine if pt is meeting nutrition needs    Vernell Lukes, RD, LDN, CNSC Registered Dietitian II Please reach out via secure chat

## 2024-02-17 NOTE — Progress Notes (Signed)
 Occupational Therapy Treatment Patient Details Name: Amanda [name] MRN: [id number] DOB: 12-08-1960 Today's Date: 02/17/2024   History of present illness 63 yo female presents to [facility] on [facility]/22 with lethargy and near-syncope s/p cocaine and opioid ingestion. 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 S/p R frontal ventriculostomy, burr holes 9/22. ETT 9/22-9/23. PMHx of cocaine abuse, CHF, CKD, chronic cough, HTN and DM2.   OT comments  Patient completing lunch upon entry. Patient able to get to EOB with verbal cues and CGA. Patient able to ambulate to sink with min/mod assist with verbal cues to stay close to walker and assistance with balance. Patient unable to stand for self care tasks and performed in sitting at sink. Patient performed transfer training from EOB to chair to address toilet transfers with min/mod assist and difficulty with turning with frequent cues for safety and hand placement.  Patient will benefit from continued inpatient follow up therapy, <3 hours/day.  Acute OT to continue to follow to address established goals to facilitate DC to next venue of care.        If plan is discharge home, recommend the following:  Two people to help with walking and/or transfers;A lot of help with bathing/dressing/bathroom;Assistance with cooking/housework;Direct supervision/assist for medications management;Direct supervision/assist for financial management;Assist for transportation;Help with stairs or ramp for entrance   Equipment Recommendations  Other (comment) (defer)    Recommendations for Other Services      Precautions / Restrictions Precautions Precautions: Fall;Other (comment) Recall of Precautions/Restrictions: Impaired Precaution/Restrictions Comments: PEG, SBP < 160, impulsive, irritable, labile       Mobility Bed Mobility Overal bed mobility: Needs Assistance Bed Mobility:  Supine to Sit, Sit to Supine     Supine to sit: Contact guard, HOB elevated Sit to supine: Contact guard assist   General bed mobility comments: CGA with trunk and cues to scoot to EOB    Transfers Overall transfer level: Needs assistance Equipment used: Rolling walker (2 wheels), None Transfers: Sit to/from Stand Sit to Stand: Min assist, Mod assist           General transfer comment: cues for hand placement and min to mod assist for sit to stand and with balance during mobility/transfers due to posterior leaning     Balance Overall balance assessment: Needs assistance Sitting-balance support: Feet supported, Single extremity supported, Bilateral upper extremity supported Sitting balance-Leahy Scale: Poor Sitting balance - Comments: progressing to fair with CGA to supervision Postural control: Posterior lean Standing balance support: Bilateral upper extremity supported, During functional activity, Reliant on assistive device for balance Standing balance-Leahy Scale: Poor Standing balance comment: posterior leaning while standing, able to correct with verbal cues but unable to maintain                           ADL either performed or assessed with clinical judgement   ADL Overall ADL's : Needs assistance/impaired Eating/Feeding: Set up;Bed level Eating/Feeding Details (indicate cue type and reason): patient completing lunch upon entry Grooming: Wash/dry hands;Wash/dry face;Oral care;Supervision/safety;Cueing for sequencing;Sitting   Upper Body Bathing: Minimal assistance;Sitting       Upper Body Dressing : Minimal assistance;Sitting Upper Body Dressing Details (indicate cue type and reason): gown for back Lower Body Dressing: Moderate assistance;Sitting/lateral leans Lower Body Dressing Details (indicate cue type and reason): to change socks Toilet Transfer: Minimal assistance;Moderate assistance;Ambulation;Rolling walker (2 wheels) Toilet Transfer Details  (indicate cue type and reason):  assistance for balance due to posterior leanign         Functional mobility during ADLs: Minimal assistance;Moderate assistance;Rolling walker (2 wheels) General ADL Comments: posterior leaning during mobility and standing ADLs.  Quickly fatigues    Extremity/Trunk Sales Promotion Account Executive Communication: Impaired Factors Affecting Communication: Reduced clarity of speech   Cognition Arousal: Alert Behavior During Therapy: Impulsive, Restless Cognition: Cognition impaired   Orientation impairments: Place, Time, Situation Awareness: Intellectual awareness impaired Memory impairment (select all impairments): Short-term memory, Working memory Attention impairment (select first level of impairment): Sustained attention Executive functioning impairment (select all impairments): Initiation, Sequencing, Reasoning, Problem solving OT - Cognition Comments: Continues to be impulsive but pleasant                 Following commands: Impaired Following commands impaired: Follows one step commands inconsistently, Follows one step commands with increased time      Cueing   Cueing Techniques: Verbal cues, Tactile cues, Visual cues, Gestural cues  Exercises      Shoulder Instructions       General Comments VSS on RA    Pertinent Vitals/ Pain       Pain Assessment Pain Assessment: Faces Faces Pain Scale: No hurt  Home Living                                          Prior Functioning/Environment              Frequency  Min 1X/week        Progress Toward Goals  OT Goals(current goals can now be found in the care plan section)  Progress towards OT goals: Progressing toward goals  Acute Rehab OT Goals Patient Stated Goal: to go outside OT Goal Formulation: With patient Time For Goal Achievement: 02/22/24 Potential to Achieve Goals:  Fair ADL Goals Pt Will Perform Eating: with min assist;sitting;bed level Pt Will Perform Grooming: with min assist;sitting Pt Will Transfer to Toilet: with mod assist;squat pivot transfer;stand pivot transfer;bedside commode Additional ADL Goal #1: Pt will attend to items on L side 50% of the time, in preparation for ADLs at EOB. Additional ADL Goal #2: Pt will follow 1-step commands 75% of the time during ADLs and functional mobility to demonstrate improved attention.  Plan      Co-evaluation                 AM-PAC OT 6 Clicks Daily Activity     Outcome Measure   Help from another person eating meals?: A Little Help from another person taking care of personal grooming?: A Little Help from another person toileting, which includes using toliet, bedpan, or urinal?: A Lot Help from another person bathing (including washing, rinsing, drying)?: A Lot Help from another person to put on and taking off regular upper body clothing?: A Little Help from another person to put on and taking off regular lower body clothing?: A Lot 6 Click Score: 15    End of Session Equipment Utilized During Treatment: Gait belt;Rolling walker (2 wheels)  OT Visit Diagnosis: Unsteadiness on feet (R26.81);Other abnormalities of gait and mobility (R26.89);Muscle weakness (generalized) (M62.81)   Activity Tolerance Patient tolerated treatment well   Patient Left in bed;with  call bell/phone within reach;with bed alarm set   Nurse Communication Mobility status        Time: 8744-8675 OT Time Calculation (min): 29 min  Charges: OT General Charges $OT Visit: 1 Visit OT Treatments $Self Care/Home Management : 8-22 mins $Therapeutic Activity: 8-22 mins  Dick Laine, OTA Acute Rehabilitation Services  Office [phone]   Jeb LITTIE Laine 02/17/2024, 2:14 PM

## 2024-02-17 NOTE — TOC Progression Note (Signed)
 Transition of Care [facility]) - Progression Note    Patient Details  Name: Amanda [name] MRN: [id number] Date of Birth: [birth year]  [facility] CM/SW Contact  Almarie CHRISTELLA Goodie, [place] Phone Number: 02/17/2024, 10:42 AM  Clinical Narrative:   CSW discussed patient's referral with regional for MFA, they reviewed and have declined to offer a bed for the patient. Patient continues with no bed offers at this time.    Expected Discharge Plan: Skilled Nursing Facility Barriers to Discharge: English As A Second Language Teacher, Continued Medical Work up, Inadequate or no insurance, Facility will not accept until restraint criteria met, SNF Pending bed offer               Expected Discharge Plan and Services In-house Referral: Clinical Social Work   Post Acute Care Choice: Skilled Nursing Facility Living arrangements for the past 2 months: Single Family Home                                       Social Drivers of Health (SDOH) Interventions SDOH Screenings   Food Insecurity: No Food Insecurity (07/16/2023)   Received from [facility]  Housing: Low Risk  (07/16/2023)   Received from [facility]  Transportation Needs: No Transportation Needs (07/16/2023)   Received from [facility]  Utilities: Not At Risk (07/16/2023)   Received from [facility]  Alcohol Screen: Low Risk  (05/14/2023)  Financial Resource Strain: Low Risk  (07/16/2023)   Received from [facility]  Social Connections: Unknown (10/19/2022)   Received from [facility]  Tobacco Use: High Risk (01/26/2024)    Readmission Risk Interventions    03/26/2022   10:50 AM  Readmission Risk Prevention Plan  Transportation Screening Complete  HRI or Home Care Consult Complete  Social Work Consult for Recovery Care Planning/Counseling Complete  Palliative Care Screening Not Applicable  Medication Review Oceanographer) Referral to Pharmacy

## 2024-02-17 NOTE — Progress Notes (Signed)
   02/16/24 2142  Provider Notification  Provider Name/Title Dr. ALONSO Ram  Date Provider Notified 02/17/24  Time Provider Notified [phone]  Method of Notification Page  Notification Reason Red med refusal-->Heparin  SQ and insulin  SQ   Provider response Evaluate remotely;No new orders  Date of Provider Response 02/17/24  Time of Provider Response 0045   Wendi Dash, RN

## 2024-02-18 DIAGNOSIS — I610 Nontraumatic intracerebral hemorrhage in hemisphere, subcortical: Secondary | ICD-10-CM | POA: Diagnosis not present

## 2024-02-18 LAB — GLUCOSE, CAPILLARY
Glucose-Capillary: 146 mg/dL — ABNORMAL HIGH (ref 70–99)
Glucose-Capillary: 154 mg/dL — ABNORMAL HIGH (ref 70–99)
Glucose-Capillary: 97 mg/dL (ref 70–99)
Glucose-Capillary: 120 mg/dL — ABNORMAL HIGH (ref 70–99)
Glucose-Capillary: 118 mg/dL — ABNORMAL HIGH (ref 70–99)
Glucose-Capillary: 111 mg/dL — ABNORMAL HIGH (ref 70–99)

## 2024-02-18 MED ORDER — FREE WATER
50.0000 mL | Freq: Every day | Status: DC
  Administered 2024-02-19 – 2024-03-14 (×23): 50 mL

## 2024-02-18 MED ORDER — ENSURE PLUS HIGH PROTEIN PO LIQD
237.0000 mL | Freq: Three times a day (TID) | ORAL | Status: DC
  Administered 2024-02-18 – 2024-04-18 (×117): 237 mL via ORAL

## 2024-02-18 NOTE — Plan of Care (Signed)
 Eating meals with set up assistance. Tube feeding discontinued. Walking with therapy and rolling walker to doorway and back to bed.     Problem: Education: Goal: Ability to describe self-care measures that may prevent or decrease complications (Diabetes Survival Skills Education) will improve Outcome: Progressing   Problem: Metabolic: Goal: Ability to maintain appropriate glucose levels will improve Outcome: Progressing   Problem: Nutritional: Goal: Maintenance of adequate nutrition will improve Outcome: Progressing   Problem: Skin Integrity: Goal: Risk for impaired skin integrity will decrease Outcome: Progressing   Problem: Safety: Goal: Ability to remain free from injury will improve Outcome: Progressing

## 2024-02-18 NOTE — Progress Notes (Signed)
 Physical Therapy Treatment Patient Details Name: Amanda [name] MRN: [id number] DOB: May 23, 1960 Today's Date: 02/18/2024   History of Present Illness 63 yo female presents to [facility] on 9/22 with lethargy and near-syncope s/p cocaine and opioid ingestion. 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 S/p R frontal ventriculostomy, burr holes 9/22. ETT 9/22-9/23. PMHx of cocaine abuse, CHF, CKD, chronic cough, HTN and DM2.    PT Comments  Pt received in supine and agreeable to session. Pt's bed noted to be soiled and pt requires assist with gown and linen change. Pt demonstrates good progress towards functional mobility goals with improved command following and awareness. Pt able to progress ambulation distance and demonstrates slightly improved RW management, but continues to require assist and dense cues. Pt continues to benefit from PT services to progress toward functional mobility goals.    If plan is discharge home, recommend the following: Two people to help with walking and/or transfers;Supervision due to cognitive status;Assist for transportation;Help with stairs or ramp for entrance;Assistance with cooking/housework;Direct supervision/assist for medications management;Direct supervision/assist for financial management   Can travel by private vehicle     No  Equipment Recommendations  Hospital bed;Wheelchair (measurements PT);Wheelchair cushion (measurements PT);Hoyer lift;BSC/3in1;Rolling walker (2 wheels)    Recommendations for Other Services       Precautions / Restrictions Precautions Precautions: Fall;Other (comment) Recall of Precautions/Restrictions: Impaired Precaution/Restrictions Comments: PEG, SBP < 160, impulsive Restrictions Weight Bearing Restrictions Per Provider Order: No     Mobility  Bed Mobility Overal bed mobility: Needs Assistance Bed Mobility: Supine to Sit, Sit to  Supine     Supine to sit: Contact guard, HOB elevated Sit to supine: Contact guard assist   General bed mobility comments: cues for technique and increased time    Transfers Overall transfer level: Needs assistance Equipment used: Rolling walker (2 wheels) Transfers: Sit to/from Stand Sit to Stand: Min assist           General transfer comment: STS from EOB with min A for stability and rise    Ambulation/Gait Ambulation/Gait assistance: Mod assist Gait Distance (Feet): 20 Feet Assistive device: Rolling walker (2 wheels) Gait Pattern/deviations: Step-through pattern, Step-to pattern, Trunk flexed, Knee hyperextension - left, Decreased stride length Gait velocity: reduced     General Gait Details: Pt demonstrates slow gait with varying step lengths. Pt requires cues and assist with RW management and proximity. Frequent cues for upright posture and increased foot clearance   Stairs             Wheelchair Mobility     Tilt Bed    Modified Rankin (Stroke Patients Only) Modified Rankin (Stroke Patients Only) Pre-Morbid Rankin Score: No symptoms Modified Rankin: Moderately severe disability     Balance Overall balance assessment: Needs assistance Sitting-balance support: Feet supported, Single extremity supported, Bilateral upper extremity supported Sitting balance-Leahy Scale: Poor Sitting balance - Comments: CGA sitting EOB   Standing balance support: Bilateral upper extremity supported, During functional activity, Reliant on assistive device for balance Standing balance-Leahy Scale: Poor Standing balance comment: reliant on RW and external support for stability                            Communication Communication Communication: Impaired Factors Affecting Communication: Reduced clarity of speech  Cognition Arousal: Alert Behavior During Therapy: Impulsive   PT - Cognitive impairments: Awareness, Attention, Safety/Judgement, Sequencing,  Initiation, Problem solving, Memory, Orientation  Following commands: Impaired Following commands impaired: Follows one step commands inconsistently, Follows one step commands with increased time    Cueing Cueing Techniques: Verbal cues, Tactile cues, Visual cues, Gestural cues  Exercises      General Comments        Pertinent Vitals/Pain Pain Assessment Pain Assessment: Faces Faces Pain Scale: No hurt     PT Goals (current goals can now be found in the care plan section) Acute Rehab PT Goals Patient Stated Goal: to go home PT Goal Formulation: With patient Time For Goal Achievement: 02/15/24 Progress towards PT goals: Progressing toward goals    Frequency    Min 1X/week       AM-PAC PT 6 Clicks Mobility   Outcome Measure  Help needed turning from your back to your side while in a flat bed without using bedrails?: A Little Help needed moving from lying on your back to sitting on the side of a flat bed without using bedrails?: A Little Help needed moving to and from a bed to a chair (including a wheelchair)?: A Little Help needed standing up from a chair using your arms (e.g., wheelchair or bedside chair)?: A Little Help needed to walk in hospital room?: A Lot Help needed climbing 3-5 steps with a railing? : Total 6 Click Score: 15    End of Session Equipment Utilized During Treatment: Gait belt Activity Tolerance: Patient tolerated treatment well Patient left: in bed;with call bell/phone within reach;with bed alarm set Nurse Communication: Mobility status PT Visit Diagnosis: Unsteadiness on feet (R26.81);Difficulty in walking, not elsewhere classified (R26.2);Other abnormalities of gait and mobility (R26.89);Other symptoms and signs involving the nervous system (R29.898);Muscle weakness (generalized) (M62.81)     Time: 8399-8377 PT Time Calculation (min) (ACUTE ONLY): 22 min  Charges:    $Gait Training: 8-22 mins PT  General Charges $$ ACUTE PT VISIT: 1 Visit                    Darryle George, PTA Acute Rehabilitation Services Secure Chat Preferred  [phone]    Darryle George 02/18/2024, 4:39 PM

## 2024-02-18 NOTE — Progress Notes (Signed)
 Pt persistently refuses Heparin  SQ and insulin  Novolog  sliding scale tonight. Education about medications provided. MD made aware.   Pt is stable hemodynamically, afebrile, normal respiration, no acute distress noted. She is alert and oriented x 2, able to rest well with no major complaints. Plan of are is reviewed. We will continue to monitor.  Wendi Dash, RN
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
## 2024-02-18 NOTE — Progress Notes (Signed)
 PROGRESS NOTE    Amanda [name]  FMW:[id number] DOB: 1960-09-01 DOA: 01/03/2024 PCP: Patient, No Pcp Per   Brief Narrative:  Patient is a 63 year old female with history of hypertension, cocaine abuse, diabetes type 2 who presented with lethargy, near syncope.  Found to be severely hypertensive, UDS positive for cocaine, with acute intraparenchymal hemorrhage , intraventricular hemorrhage and hydrocephalus.  Patient was admitted to ICU for close blood pressure management.  Neurosurgery consulted.  Status post right frontal ventriculostomy. EVD eventually removed on 10/3. Hospital course remarkable for dysphagia.  Speech therapy following.  Dietitian/speech therapy recommending PEG placement because she is not able to tolerate anything by mouth.  PT/OT recommending SNF on discharge.  Underwent PEG placement by IR on 10/16. TOC following    Significant events: 9/22 Admitted and intubated, emergent burr holes and right frontal ventriculostomy placed by neurosurgery due to neurological worsening 9/23 extubated 10/1 EVD clamped 10/3 EVD removed 10/16 PEG placement by IR for worsening dysphagia  Assessment & Plan:   Principal Problem:   ICH (intracerebral hemorrhage) (HCC) Active Problems:   Hypertensive emergency   Tobacco abuse   Cocaine abuse (HCC)   History of alcohol abuse   Chronic combined systolic and diastolic heart failure (HCC)   HTN (hypertension)   Hemorrhagic stroke (HCC)   Malnutrition of moderate degree  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 Continues to receive PT. Plan for discharge to SNF.  EVD removed 01/14/2024, scalp staples removed 02/01/2024. Still pending bed offer.   Hypertension: Currently on carvedilol  25 mg twice daily, Entresto .  Patient's systolic blood pressure has been mostly under 120 and diastolic under 80.  Amlodipine  discontinued due to  history of systolic CHF and blood pressure now optimal.   Dysphagia: S/P PEG placement on 10/16. Now has progressed to dysphagia 3 diet and she has been eating enough and meeting all the nutritional demands, dietitian on board, doing calorie counts for another day and hopefully she will not need anymore tube feedings soon.   Chronic combined systolic/diastolic CHF: Stable, continue Entresto  and spironolactone .  Not on aspirin  due to recent hemorrhage.    Type 2 diabetes: Recent A1c of 6.6. Glucose controlled on sliding scale insulin .   Cocaine abuse: Consulted TOC for cocaine cessation resources.  Delirium/fall: On Seroquel  and as needed soft restraints.  However she has not required any soft restraints since 02/13/2024 per nursing reports. 1. Avoid benzodiazepines, antihistamines, anticholinergics, and minimize opiate use as these may worsen delirium. 2: Assess, prevent and manage pain as lack of treatment can result in delirium.  3: Provide appropriate lighting and clear signage; a clock and calendar should be easily visible to the patient. 4: Monitor environmental factors. Reduce light and noise at night (close shades, turn off lights, turn off TV, ect). Correct any alterations in sleep cycle. 5: Reorient the patient to person, place, time and situation on each encounter.  6: Correct sensory deficits if possible (replace eye glasses, hearing aids, ect). 7: Avoid restraints if able. Severely delirious patients benefit from constant observation by a sitter.  DVT prophylaxis: heparin  injection 5,000 Units Start: 01/05/24 1015 Place and maintain sequential compression device Start: 01/03/24 0611 SCDs Start: 01/03/24 0429   Code Status: Full Code  Family Communication:  None present at bedside.   Status is: Inpatient Remains inpatient appropriate because: Medically stable, pending placement.   Estimated body mass index is 19.72 kg/m as calculated from the following:  Height as of this  encounter: 5' 5 (1.651 m).   Weight as of this encounter: 53.8 kg.    Nutritional Assessment: Body mass index is 19.72 kg/m.Amanda [name] [name].  I agree with the assessment and plan as outlined below: Nutrition Status: Nutrition Problem: Moderate Malnutrition Etiology: social / environmental circumstances (polysubstance abuse) Signs/Symptoms: mild muscle depletion, moderate fat depletion, mild fat depletion, moderate muscle depletion Interventions: Prostat, Tube feeding  . Skin Assessment: I have examined the patient's skin and I agree with the wound assessment as performed by the wound care RN as outlined below:    Consultants:  Neurosurgery and IR  Procedures:  As above  Antimicrobials:  Anti-infectives (From admission, onward)    Start     Dose/Rate Route Frequency Ordered Stop   01/27/24 0807  ceFAZolin (ANCEF) IVPB 2g/100 mL premix        over 30 Minutes Intravenous Continuous PRN 01/27/24 0807 01/27/24 0807   01/13/24 0600  ceFAZolin (ANCEF) IVPB 2g/100 mL premix        2 g 200 mL/hr over 30 Minutes Intravenous On call to O.R. 01/11/24 2300 01/13/24 0557         Subjective: Seen and examined.  No complaints.  Fully alert and oriented.  She is really looking forward to get discharged as soon as possible.  Objective: Vitals:   02/17/24 2036 02/17/24 2316 02/18/24 0410 02/18/24 0417  BP: 111/76 124/73 (!) 142/83   Pulse: 80 76 69   Resp: 18 18 18    Temp: (!) 97.4 F (36.3 C) 99.3 F (37.4 C) 97.7 F (36.5 C)   TempSrc: Oral Axillary Axillary   SpO2: 92% 95% 100%   Weight:    53.8 kg  Height:        Intake/Output Summary (Last 24 hours) at 02/18/2024 0826 Last data filed at 02/18/2024 0412 Gross per 24 hour  Intake 1578 ml  Output 400 ml  Net 1178 ml   Filed Weights   02/16/24 0500 02/17/24 0500 02/18/24 0417  Weight: 55.8 kg 54.7 kg 53.8 kg    Examination:  General exam: Appears calm and comfortable  Respiratory system: Clear to  auscultation. Respiratory effort normal. Cardiovascular system: S1 & S2 heard, RRR. No JVD, murmurs, rubs, gallops or clicks. No pedal edema. Gastrointestinal system: Abdomen is nondistended, soft and nontender. No organomegaly or masses felt. Normal bowel sounds heard. Central nervous system: Alert and oriented x 2.  Left hemiparesis. Extremities: Symmetric 5 x 5 power.   Data Reviewed: I have personally reviewed following labs and imaging studies  CBC: No results for input(s): WBC, NEUTROABS, HGB, HCT, MCV, PLT in the last 168 hours.  Basic Metabolic Panel: No results for input(s): NA, K, CL, CO2, GLUCOSE, BUN, CREATININE, CALCIUM, MG, PHOS in the last 168 hours.  GFR: Estimated Creatinine Clearance: 57.6 mL/min (by C-G formula based on SCr of 0.86 mg/dL). Liver Function Tests: No results for input(s): AST, ALT, ALKPHOS, BILITOT, PROT, ALBUMIN in the last 168 hours. No results for input(s): LIPASE, AMYLASE in the last 168 hours. No results for input(s): AMMONIA in the last 168 hours. Coagulation Profile: No results for input(s): INR, PROTIME in the last 168 hours.  Cardiac Enzymes: No results for input(s): CKTOTAL, CKMB, CKMBINDEX, TROPONINI in the last 168 hours. BNP (last 3 results) No results for input(s): PROBNP in the last 8760 hours. HbA1C: No results for input(s): HGBA1C in the last 72 hours. CBG: Recent Labs  Lab 02/17/24 0739 02/17/24 1146 02/17/24 1613  02/17/24 2314 02/18/24 0408  GLUCAP 159* 144* 117* 132* 146*   Lipid Profile: No results for input(s): CHOL, HDL, LDLCALC, TRIG, CHOLHDL, LDLDIRECT in the last 72 hours. Thyroid Function Tests: No results for input(s): TSH, T4TOTAL, FREET4, T3FREE, THYROIDAB in the last 72 hours. Anemia Panel: No results for input(s): VITAMINB12, FOLATE, FERRITIN, TIBC, IRON, RETICCTPCT in the last 72 hours. Sepsis Labs: No  results for input(s): PROCALCITON, LATICACIDVEN in the last 168 hours.  No results found for this or any previous visit (from the past 240 hours).   Radiology Studies: No results found.  Scheduled Meds:  carvedilol   25 mg Oral BID WC   feeding supplement  237 mL Oral BID BM   feeding supplement (PROSource TF20)  60 mL Per Tube Daily   free water   60 mL Per Tube Q4H   heparin  injection (subcutaneous)  5,000 Units Subcutaneous Q8H   insulin  aspart  0-9 Units Subcutaneous Q4H   mineral oil-hydrophilic petrolatum    Topical BID   mouth rinse  15 mL Mouth Rinse 4 times per day   QUEtiapine   25 mg Oral QHS   sacubitril -valsartan   1 tablet Oral BID   spironolactone   25 mg Oral Daily   Continuous Infusions:  feeding supplement (OSMOLITE 1.5 CAL) 40 mL/hr at 02/17/24 2000     LOS: 46 days   Fredia Skeeter, MD [facility]  02/18/2024, 8:26 AM   *Please note that this is a verbal dictation therefore any spelling or grammatical errors are due to the Dragon Medical One system interpretation.  Please page via Amion and do not message via secure chat for urgent patient care matters. Secure chat can be used for non urgent patient care matters.  How to contact the [facility] Attending or Consulting provider 7A - 7P or covering provider during after hours 7P -7A, for this patient?  Check the care team in [facility] and look for a) attending/consulting [facility] provider listed and b) the [facility] team listed. Page or secure chat 7A-7P. Log into [email or web address] and use [place]'s universal password to access. If you do not have the password, please contact the hospital operator. Locate the [facility] provider you are looking for under [facility] and page to a number that you can be directly reached. If you still have difficulty reaching the provider, please page the [facility] (Director on Call) for the Hospitalists listed on amion for assistance.

## 2024-02-18 NOTE — Progress Notes (Signed)
 Calorie Count Note  48-hour calorie count ordered.  Pt with increased alertness and more consistent intake. See below for results x 24 hours. Pt with slightly less intake today, but very accepting of oral nutrition supplements. Will increase supplements to TID and discontinue tube feed. Tube will still need to be flushed 1x/d to ensure line is clean and kept patent. Of note, RN reports pt received glucerna today as her supplement. Ensure will provide more kcal and protein which will help meet nutrition goals.   Diet: DYS 3, thin Supplements: Ensure Plus High Protein BID  Estimated Nutritional Needs:  Kcal:  1500-1700 Protein:  80-90 grams Fluid:  1.5-1.7L  11/6 Dinner: 435 kcal, 35g protein 11/7 Breakfast: refused 11/7 Lunch: 258 kcal, 15g protein Supplements: Glucerna x 2 (per RN), 440kcal, 20g protein  Total intake: 1133 kcal (76% of minimum estimated needs)  70 protein (80% of minimum estimated needs)  NUTRITION DIAGNOSIS:  Moderate Malnutrition related to social / environmental circumstances (polysubstance abuse) as evidenced by mild muscle depletion, moderate fat depletion, mild fat depletion, moderate muscle depletion. - remains applicable   GOAL:  Patient will meet greater than or equal to 90% of their needs - met with TF at goal  INTERVENTION:  Discontinue TF orders. Flush tube with 50mL of water  q24 hours to maintain patency and ensure cleanliness  Continue current diet as ordered per SLP Encourage PO intake  Increase Ensure Plus High Protein po to TID, each supplement provides 350 kcal and 20 grams of protein    Vernell Lukes, RD, LDN, CNSC Registered Dietitian II Please reach out via secure chat

## 2024-02-19 DIAGNOSIS — I610 Nontraumatic intracerebral hemorrhage in hemisphere, subcortical: Secondary | ICD-10-CM | POA: Diagnosis not present

## 2024-02-19 LAB — GLUCOSE, CAPILLARY
Glucose-Capillary: 112 mg/dL — ABNORMAL HIGH (ref 70–99)
Glucose-Capillary: 114 mg/dL — ABNORMAL HIGH (ref 70–99)
Glucose-Capillary: 132 mg/dL — ABNORMAL HIGH (ref 70–99)
Glucose-Capillary: 148 mg/dL — ABNORMAL HIGH (ref 70–99)
Glucose-Capillary: 104 mg/dL — ABNORMAL HIGH (ref 70–99)
Glucose-Capillary: 97 mg/dL (ref 70–99)

## 2024-02-19 NOTE — Plan of Care (Signed)
  Problem: Education: Goal: Ability to describe self-care measures that may prevent or decrease complications (Diabetes Survival Skills Education) will improve Outcome: Progressing   Problem: Coping: Goal: Ability to adjust to condition or change in health will improve Outcome: Progressing   Problem: Fluid Volume: Goal: Ability to maintain a balanced intake and output will improve Outcome: Progressing   Problem: Health Behavior/Discharge Planning: Goal: Ability to identify and utilize available resources and services will improve Outcome: Progressing Goal: Ability to manage health-related needs will improve Outcome: Progressing   Problem: Metabolic: Goal: Ability to maintain appropriate glucose levels will improve Outcome: Progressing   Problem: Nutritional: Goal: Maintenance of adequate nutrition will improve Outcome: Progressing Goal: Progress toward achieving an optimal weight will improve Outcome: Progressing   Problem: Skin Integrity: Goal: Risk for impaired skin integrity will decrease Outcome: Progressing   Problem: Tissue Perfusion: Goal: Adequacy of tissue perfusion will improve Outcome: Progressing   Problem: Education: Goal: Knowledge of General Education information will improve Description: Including pain rating scale, medication(s)/side effects and non-pharmacologic comfort measures Outcome: Progressing   Problem: Health Behavior/Discharge Planning: Goal: Ability to manage health-related needs will improve Outcome: Progressing   Problem: Clinical Measurements: Goal: Ability to maintain clinical measurements within normal limits will improve Outcome: Progressing Goal: Will remain free from infection Outcome: Progressing Goal: Diagnostic test results will improve Outcome: Progressing Goal: Respiratory complications will improve Outcome: Progressing Goal: Cardiovascular complication will be avoided Outcome: Progressing   Problem: Activity: Goal:  Risk for activity intolerance will decrease Outcome: Progressing   Problem: Nutrition: Goal: Adequate nutrition will be maintained Outcome: Progressing   Problem: Coping: Goal: Level of anxiety will decrease Outcome: Progressing   Problem: Elimination: Goal: Will not experience complications related to bowel motility Outcome: Progressing Goal: Will not experience complications related to urinary retention Outcome: Progressing   Problem: Pain Managment: Goal: General experience of comfort will improve and/or be controlled Outcome: Progressing   Problem: Safety: Goal: Ability to remain free from injury will improve Outcome: Progressing   Problem: Skin Integrity: Goal: Risk for impaired skin integrity will decrease Outcome: Progressing   Problem: Education: Goal: Knowledge of disease or condition will improve Outcome: Progressing Goal: Knowledge of secondary prevention will improve (MUST DOCUMENT ALL) Outcome: Progressing Goal: Knowledge of patient specific risk factors will improve (DELETE if not current risk factor) Outcome: Progressing   Problem: Intracerebral Hemorrhage Tissue Perfusion: Goal: Complications of Intracerebral Hemorrhage will be minimized Outcome: Progressing   Problem: Coping: Goal: Will verbalize positive feelings about self Outcome: Progressing Goal: Will identify appropriate support needs Outcome: Progressing   Problem: Health Behavior/Discharge Planning: Goal: Ability to manage health-related needs will improve Outcome: Progressing Goal: Goals will be collaboratively established with patient/family Outcome: Progressing   Problem: Self-Care: Goal: Ability to participate in self-care as condition permits will improve Outcome: Progressing Goal: Verbalization of feelings and concerns over difficulty with self-care will improve Outcome: Progressing Goal: Ability to communicate needs accurately will improve Outcome: Progressing   Problem:  Nutrition: Goal: Risk of aspiration will decrease Outcome: Progressing Goal: Dietary intake will improve Outcome: Progressing   Problem: Safety: Goal: Non-violent Restraint(s) Outcome: Progressing

## 2024-02-19 NOTE — Progress Notes (Signed)
 PROGRESS NOTE    Amanda [name]  FMW:[id number] DOB: 1961/04/12 DOA: 01/03/2024 PCP: Patient, No Pcp Per   Brief Narrative:  Patient is a 63 year old female with history of hypertension, cocaine abuse, diabetes type 2 who presented with lethargy, near syncope.  Found to be severely hypertensive, UDS positive for cocaine, with acute intraparenchymal hemorrhage , intraventricular hemorrhage and hydrocephalus.  Patient was admitted to ICU for close blood pressure management.  Neurosurgery consulted.  Status post right frontal ventriculostomy. EVD eventually removed on 10/3. Hospital course remarkable for dysphagia.  Speech therapy following.  Dietitian/speech therapy recommending PEG placement because she is not able to tolerate anything by mouth.  PT/OT recommending SNF on discharge.  Underwent PEG placement by IR on 10/16. TOC following    Significant events: 9/22 Admitted and intubated, emergent burr holes and right frontal ventriculostomy placed by neurosurgery due to neurological worsening 9/23 extubated 10/1 EVD clamped 10/3 EVD removed 10/16 PEG placement by IR for worsening dysphagia  Assessment & Plan:   Principal Problem:   ICH (intracerebral hemorrhage) (HCC) Active Problems:   Hypertensive emergency   Tobacco abuse   Cocaine abuse (HCC)   History of alcohol abuse   Chronic combined systolic and diastolic heart failure (HCC)   HTN (hypertension)   Hemorrhagic stroke (HCC)   Malnutrition of moderate degree  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 Continues to receive PT. Plan for discharge to SNF.  EVD removed 01/14/2024, scalp staples removed 02/01/2024. Still pending bed offer.   Hypertension: Currently on carvedilol  25 mg twice daily, Entresto .  Patient's systolic blood pressure has been mostly under 120 and diastolic under 80.  Amlodipine  discontinued due to  history of systolic CHF and blood pressure now optimal.   Dysphagia: S/P PEG placement on 10/16. Now has progressed to dysphagia 3 diet and she has been eating enough and meeting all the nutritional demands, dietitian on board, tube feeds stopped 02/18/2024.  She remains on protein supplements.   Chronic combined systolic/diastolic CHF: Stable, continue Entresto  and spironolactone .  Not on aspirin  due to recent hemorrhage.    Type 2 diabetes: Recent A1c of 6.6. Glucose controlled on sliding scale insulin .   Cocaine abuse: Consulted TOC for cocaine cessation resources.  Delirium/fall: On Seroquel  and as needed soft restraints.  However she has not required any soft restraints since 02/13/2024 per nursing reports. 1. Avoid benzodiazepines, antihistamines, anticholinergics, and minimize opiate use as these may worsen delirium. 2: Assess, prevent and manage pain as lack of treatment can result in delirium.  3: Provide appropriate lighting and clear signage; a clock and calendar should be easily visible to the patient. 4: Monitor environmental factors. Reduce light and noise at night (close shades, turn off lights, turn off TV, ect). Correct any alterations in sleep cycle. 5: Reorient the patient to person, place, time and situation on each encounter.  6: Correct sensory deficits if possible (replace eye glasses, hearing aids, ect). 7: Avoid restraints if able. Severely delirious patients benefit from constant observation by a sitter.  DVT prophylaxis: heparin  injection 5,000 Units Start: 01/05/24 1015 Place and maintain sequential compression device Start: 01/03/24 0611 SCDs Start: 01/03/24 0429   Code Status: Full Code  Family Communication:  None present at bedside.   Status is: Inpatient Remains inpatient appropriate because: Medically stable, pending placement.   Estimated body mass index is 19.26 kg/m as calculated from the following:   Height as of this encounter: 5'  5 (1.651 m).    Weight as of this encounter: 52.5 kg.    Nutritional Assessment: Body mass index is 19.26 kg/m.[name] [name].  I agree with the assessment and plan as outlined below: Nutrition Status: Nutrition Problem: Moderate Malnutrition Etiology: social / environmental circumstances (polysubstance abuse) Signs/Symptoms: mild muscle depletion, moderate fat depletion, mild fat depletion, moderate muscle depletion Interventions: Prostat, Tube feeding  . Skin Assessment: I have examined the patient's skin and I agree with the wound assessment as performed by the wound care RN as outlined below:    Consultants:  Neurosurgery and IR  Procedures:  As above  Antimicrobials:  Anti-infectives (From admission, onward)    Start     Dose/Rate Route Frequency Ordered Stop   01/27/24 0807  ceFAZolin (ANCEF) IVPB 2g/100 mL premix        over 30 Minutes Intravenous Continuous PRN 01/27/24 0807 01/27/24 0807   01/13/24 0600  ceFAZolin (ANCEF) IVPB 2g/100 mL premix        2 g 200 mL/hr over 30 Minutes Intravenous On call to O.R. 01/11/24 2300 01/13/24 0557         Subjective: Seen and examined.  Alert and oriented.  No complaints.  Objective: Vitals:   02/18/24 2128 02/18/24 2312 02/19/24 0354 02/19/24 0610  BP: 97/68 (!) 141/49 124/71   Pulse: 80 78 80   Resp: 18 18 18    Temp: 98.1 F (36.7 C) 98 F (36.7 C) 98 F (36.7 C)   TempSrc: Oral Oral Oral   SpO2: 93% 95% 99%   Weight:    52.5 kg  Height:        Intake/Output Summary (Last 24 hours) at 02/19/2024 0752 Last data filed at 02/19/2024 0600 Gross per 24 hour  Intake 357 ml  Output 1200 ml  Net -843 ml   Filed Weights   02/17/24 0500 02/18/24 0417 02/19/24 0610  Weight: 54.7 kg 53.8 kg 52.5 kg    Examination:  General exam: Appears calm and comfortable  Respiratory system: Clear to auscultation. Respiratory effort normal. Cardiovascular system: S1 & S2 heard, RRR. No JVD, murmurs, rubs, gallops or clicks. No pedal  edema. Gastrointestinal system: Abdomen is nondistended, soft and nontender. No organomegaly or masses felt. Normal bowel sounds heard. Central nervous system: Alert and oriented x 2.  Left hemiparesis. Extremities: Symmetric 5 x 5 power.   Data Reviewed: I have personally reviewed following labs and imaging studies  CBC: No results for input(s): WBC, NEUTROABS, HGB, HCT, MCV, PLT in the last 168 hours.  Basic Metabolic Panel: No results for input(s): NA, K, CL, CO2, GLUCOSE, BUN, CREATININE, CALCIUM, MG, PHOS in the last 168 hours.  GFR: CrCl cannot be calculated (Patient's most recent lab result is older than the maximum 21 days allowed.). Liver Function Tests: No results for input(s): AST, ALT, ALKPHOS, BILITOT, PROT, ALBUMIN in the last 168 hours. No results for input(s): LIPASE, AMYLASE in the last 168 hours. No results for input(s): AMMONIA in the last 168 hours. Coagulation Profile: No results for input(s): INR, PROTIME in the last 168 hours.  Cardiac Enzymes: No results for input(s): CKTOTAL, CKMB, CKMBINDEX, TROPONINI in the last 168 hours. BNP (last 3 results) No results for input(s): PROBNP in the last 8760 hours. HbA1C: No results for input(s): HGBA1C in the last 72 hours. CBG: Recent Labs  Lab 02/18/24 1122 02/18/24 1641 02/18/24 2129 02/18/24 2311 02/19/24 0355  GLUCAP 97 120* 118* 111* 112*   Lipid Profile: No results for  input(s): CHOL, HDL, LDLCALC, TRIG, CHOLHDL, LDLDIRECT in the last 72 hours. Thyroid Function Tests: No results for input(s): TSH, T4TOTAL, FREET4, T3FREE, THYROIDAB in the last 72 hours. Anemia Panel: No results for input(s): VITAMINB12, FOLATE, FERRITIN, TIBC, IRON, RETICCTPCT in the last 72 hours. Sepsis Labs: No results for input(s): PROCALCITON, LATICACIDVEN in the last 168 hours.  No results found for this or any previous visit  (from the past 240 hours).   Radiology Studies: No results found.  Scheduled Meds:  carvedilol   25 mg Oral BID WC   feeding supplement  237 mL Oral TID BM   free water   50 mL Per Tube Daily   heparin  injection (subcutaneous)  5,000 Units Subcutaneous Q8H   insulin  aspart  0-9 Units Subcutaneous Q4H   mineral oil-hydrophilic petrolatum    Topical BID   mouth rinse  15 mL Mouth Rinse 4 times per day   QUEtiapine   25 mg Oral QHS   sacubitril -valsartan   1 tablet Oral BID   spironolactone   25 mg Oral Daily   Continuous Infusions:     LOS: 47 days   Fredia Skeeter, MD [facility]  02/19/2024, 7:52 AM   *Please note that this is a verbal dictation therefore any spelling or grammatical errors are due to the Dragon Medical One system interpretation.  Please page via Amion and do not message via secure chat for urgent patient care matters. Secure chat can be used for non urgent patient care matters.  How to contact the [facility] Attending or Consulting provider 7A - 7P or covering provider during after hours 7P -7A, for this patient?  Check the care team in [facility] and look for a) attending/consulting [facility] provider listed and b) the [facility] team listed. Page or secure chat 7A-7P. Log into [email or web address] and use [place]'s universal password to access. If you do not have the password, please contact the hospital operator. Locate the [facility] provider you are looking for under [facility] and page to a number that you can be directly reached. If you still have difficulty reaching the provider, please page the [facility] (Director on Call) for the Hospitalists listed on amion for assistance.

## 2024-02-20 DIAGNOSIS — I610 Nontraumatic intracerebral hemorrhage in hemisphere, subcortical: Secondary | ICD-10-CM | POA: Diagnosis not present

## 2024-02-20 LAB — GLUCOSE, CAPILLARY
Glucose-Capillary: 108 mg/dL — ABNORMAL HIGH (ref 70–99)
Glucose-Capillary: 111 mg/dL — ABNORMAL HIGH (ref 70–99)
Glucose-Capillary: 125 mg/dL — ABNORMAL HIGH (ref 70–99)
Glucose-Capillary: 129 mg/dL — ABNORMAL HIGH (ref 70–99)
Glucose-Capillary: 85 mg/dL (ref 70–99)
Glucose-Capillary: 96 mg/dL (ref 70–99)

## 2024-02-20 NOTE — Progress Notes (Signed)
 PROGRESS NOTE    Amanda [name]  FMW:[id number] DOB: 22-Apr-1960 DOA: 01/03/2024 PCP: Patient, No Pcp Per   Brief Narrative:  Patient is a 63 year old female with history of hypertension, cocaine abuse, diabetes type 2 who presented with lethargy, near syncope.  Found to be severely hypertensive, UDS positive for cocaine, with acute intraparenchymal hemorrhage , intraventricular hemorrhage and hydrocephalus.  Patient was admitted to ICU for close blood pressure management.  Neurosurgery consulted.  Status post right frontal ventriculostomy. EVD eventually removed on 10/3. Hospital course remarkable for dysphagia.  Speech therapy following.  Dietitian/speech therapy recommending PEG placement because she is not able to tolerate anything by mouth.  PT/OT recommending SNF on discharge.  Underwent PEG placement by IR on 10/16. TOC following    Significant events: 9/22 Admitted and intubated, emergent burr holes and right frontal ventriculostomy placed by neurosurgery due to neurological worsening 9/23 extubated 10/1 EVD clamped 10/3 EVD removed 10/16 PEG placement by IR for worsening dysphagia  Assessment & Plan:   Principal Problem:   ICH (intracerebral hemorrhage) (HCC) Active Problems:   Hypertensive emergency   Tobacco abuse   Cocaine abuse (HCC)   History of alcohol abuse   Chronic combined systolic and diastolic heart failure (HCC)   HTN (hypertension)   Hemorrhagic stroke (HCC)   Malnutrition of moderate degree  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 Continues to receive PT. Plan for discharge to SNF.  EVD removed 01/14/2024, scalp staples removed 02/01/2024. Still pending bed offer.   Hypertension: Currently on carvedilol  25 mg twice daily, Entresto .  Patient's systolic blood pressure has been mostly under 120 and diastolic under 80.  Amlodipine  discontinued due to  history of systolic CHF and blood pressure now optimal.   Dysphagia: S/P PEG placement on 10/16. Now has progressed to dysphagia 3 diet and she has been eating enough and meeting all the nutritional demands, dietitian on board, tube feeds stopped 02/18/2024.  She remains on protein supplements.   Chronic combined systolic/diastolic CHF: Stable, continue Entresto  and spironolactone .  Not on aspirin  due to recent hemorrhage.    Type 2 diabetes: Recent A1c of 6.6. Glucose controlled on sliding scale insulin .   Cocaine abuse: Consulted TOC for cocaine cessation resources.  Delirium/fall: On Seroquel  and as needed soft restraints.  However she has not required any soft restraints since 02/13/2024 per nursing reports. 1. Avoid benzodiazepines, antihistamines, anticholinergics, and minimize opiate use as these may worsen delirium. 2: Assess, prevent and manage pain as lack of treatment can result in delirium.  3: Provide appropriate lighting and clear signage; a clock and calendar should be easily visible to the patient. 4: Monitor environmental factors. Reduce light and noise at night (close shades, turn off lights, turn off TV, ect). Correct any alterations in sleep cycle. 5: Reorient the patient to person, place, time and situation on each encounter.  6: Correct sensory deficits if possible (replace eye glasses, hearing aids, ect). 7: Avoid restraints if able. Severely delirious patients benefit from constant observation by a sitter.  DVT prophylaxis: heparin  injection 5,000 Units Start: 01/05/24 1015 Place and maintain sequential compression device Start: 01/03/24 0611 SCDs Start: 01/03/24 0429   Code Status: Full Code  Family Communication:  None present at bedside.   Status is: Inpatient Remains inpatient appropriate because: Medically stable, pending placement.   Estimated body mass index is 19.19 kg/m as calculated from the following:   Height as of this encounter: 5'  5 (1.651 m).    Weight as of this encounter: 52.3 kg.    Nutritional Assessment: Body mass index is 19.19 kg/m.Amanda [name] [name].  I agree with the assessment and plan as outlined below: Nutrition Status: Nutrition Problem: Moderate Malnutrition Etiology: social / environmental circumstances (polysubstance abuse) Signs/Symptoms: mild muscle depletion, moderate fat depletion, mild fat depletion, moderate muscle depletion Interventions: Prostat, Tube feeding  . Skin Assessment: I have examined the patient's skin and I agree with the wound assessment as performed by the wound care RN as outlined below:    Consultants:  Neurosurgery and IR  Procedures:  As above  Antimicrobials:  Anti-infectives (From admission, onward)    Start     Dose/Rate Route Frequency Ordered Stop   01/27/24 0807  ceFAZolin (ANCEF) IVPB 2g/100 mL premix        over 30 Minutes Intravenous Continuous PRN 01/27/24 0807 01/27/24 0807   01/13/24 0600  ceFAZolin (ANCEF) IVPB 2g/100 mL premix        2 g 200 mL/hr over 30 Minutes Intravenous On call to O.R. 01/11/24 2300 01/13/24 0557         Subjective: Seen and examined.  She is fully alert and oriented.  She has no complaints.  Objective: Vitals:   02/19/24 1143 02/19/24 1628 02/20/24 0500 02/20/24 0600  BP: 126/89 123/85  117/73  Pulse: 93 90  77  Resp: 16 18    Temp: 98 F (36.7 C) 97.8 F (36.6 C)  97.9 F (36.6 C)  TempSrc:    Oral  SpO2: 93% 100%  97%  Weight:   52.3 kg   Height:        Intake/Output Summary (Last 24 hours) at 02/20/2024 0824 Last data filed at 02/19/2024 1040 Gross per 24 hour  Intake 50 ml  Output --  Net 50 ml   Filed Weights   02/18/24 0417 02/19/24 0610 02/20/24 0500  Weight: 53.8 kg 52.5 kg 52.3 kg    Examination:  General exam: Appears calm and comfortable  Respiratory system: Clear to auscultation. Respiratory effort normal. Cardiovascular system: S1 & S2 heard, RRR. No JVD, murmurs, rubs, gallops or clicks. No  pedal edema. Gastrointestinal system: Abdomen is nondistended, soft and nontender. No organomegaly or masses felt. Normal bowel sounds heard. Central nervous system: Alert and oriented x 2.  Left hemiparesis. Extremities: Symmetric 5 x 5 power.   Data Reviewed: I have personally reviewed following labs and imaging studies  CBC: No results for input(s): WBC, NEUTROABS, HGB, HCT, MCV, PLT in the last 168 hours.  Basic Metabolic Panel: No results for input(s): NA, K, CL, CO2, GLUCOSE, BUN, CREATININE, CALCIUM, MG, PHOS in the last 168 hours.  GFR: CrCl cannot be calculated (Patient's most recent lab result is older than the maximum 21 days allowed.). Liver Function Tests: No results for input(s): AST, ALT, ALKPHOS, BILITOT, PROT, ALBUMIN in the last 168 hours. No results for input(s): LIPASE, AMYLASE in the last 168 hours. No results for input(s): AMMONIA in the last 168 hours. Coagulation Profile: No results for input(s): INR, PROTIME in the last 168 hours.  Cardiac Enzymes: No results for input(s): CKTOTAL, CKMB, CKMBINDEX, TROPONINI in the last 168 hours. BNP (last 3 results) No results for input(s): PROBNP in the last 8760 hours. HbA1C: No results for input(s): HGBA1C in the last 72 hours. CBG: Recent Labs  Lab 02/19/24 1143 02/19/24 1631 02/19/24 1953 02/19/24 2307 02/20/24 0406  GLUCAP 132* 148* 104* 97 111*   Lipid Profile:  No results for input(s): CHOL, HDL, LDLCALC, TRIG, CHOLHDL, LDLDIRECT in the last 72 hours. Thyroid Function Tests: No results for input(s): TSH, T4TOTAL, FREET4, T3FREE, THYROIDAB in the last 72 hours. Anemia Panel: No results for input(s): VITAMINB12, FOLATE, FERRITIN, TIBC, IRON, RETICCTPCT in the last 72 hours. Sepsis Labs: No results for input(s): PROCALCITON, LATICACIDVEN in the last 168 hours.  No results found for this or any previous  visit (from the past 240 hours).   Radiology Studies: No results found.  Scheduled Meds:  carvedilol   25 mg Oral BID WC   feeding supplement  237 mL Oral TID BM   free water   50 mL Per Tube Daily   heparin  injection (subcutaneous)  5,000 Units Subcutaneous Q8H   insulin  aspart  0-9 Units Subcutaneous Q4H   mineral oil-hydrophilic petrolatum    Topical BID   mouth rinse  15 mL Mouth Rinse 4 times per day   QUEtiapine   25 mg Oral QHS   sacubitril -valsartan   1 tablet Oral BID   spironolactone   25 mg Oral Daily   Continuous Infusions:     LOS: 48 days   Fredia Skeeter, MD [facility]  02/20/2024, 8:24 AM   *Please note that this is a verbal dictation therefore any spelling or grammatical errors are due to the Dragon Medical One system interpretation.  Please page via Amion and do not message via secure chat for urgent patient care matters. Secure chat can be used for non urgent patient care matters.  How to contact the [facility] Attending or Consulting provider 7A - 7P or covering provider during after hours 7P -7A, for this patient?  Check the care team in [facility] and look for a) attending/consulting [facility] provider listed and b) the [facility] team listed. Page or secure chat 7A-7P. Log into [email or web address] and use [place]'s universal password to access. If you do not have the password, please contact the hospital operator. Locate the [facility] provider you are looking for under [facility] and page to a number that you can be directly reached. If you still have difficulty reaching the provider, please page the [facility] (Director on Call) for the Hospitalists listed on amion for assistance.

## 2024-02-20 NOTE — Plan of Care (Signed)
 General Condition seems fair. Had good mood at morning time, after doctor visit she asked to the doctor about going home and he told that she is going to rehab at first than her mood change immediately and remains irritated most time of the day.  Problem: Skin Integrity: Goal: Risk for impaired skin integrity will decrease Outcome: Progressing   Problem: Tissue Perfusion: Goal: Adequacy of tissue perfusion will improve Outcome: Progressing   Problem: Metabolic: Goal: Ability to maintain appropriate glucose levels will improve Outcome: Progressing   Problem: Coping: Goal: Ability to adjust to condition or change in health will improve Outcome: Progressing   Problem: Elimination: Goal: Will not experience complications related to urinary retention Outcome: Progressing   Problem: Education: Goal: Knowledge of secondary prevention will improve (MUST DOCUMENT ALL) Outcome: Progressing   Problem: Education: Goal: Knowledge of patient specific risk factors will improve (DELETE if not current risk factor) Outcome: Progressing   Problem: Intracerebral Hemorrhage Tissue Perfusion: Goal: Complications of Intracerebral Hemorrhage will be minimized Outcome: Progressing   Problem: Nutrition: Goal: Risk of aspiration will decrease Outcome: Progressing

## 2024-02-20 NOTE — Plan of Care (Signed)
  Problem: Metabolic: Goal: Ability to maintain appropriate glucose levels will improve Outcome: Progressing   Problem: Nutritional: Goal: Maintenance of adequate nutrition will improve Outcome: Progressing   Problem: Tissue Perfusion: Goal: Adequacy of tissue perfusion will improve Outcome: Progressing   Problem: Education: Goal: Ability to describe self-care measures that may prevent or decrease complications (Diabetes Survival Skills Education) will improve Outcome: Not Progressing   Problem: Health Behavior/Discharge Planning: Goal: Ability to manage health-related needs will improve Outcome: Not Progressing

## 2024-02-21 DIAGNOSIS — I610 Nontraumatic intracerebral hemorrhage in hemisphere, subcortical: Secondary | ICD-10-CM | POA: Diagnosis not present

## 2024-02-21 LAB — GLUCOSE, CAPILLARY
Glucose-Capillary: 92 mg/dL (ref 70–99)
Glucose-Capillary: 113 mg/dL — ABNORMAL HIGH (ref 70–99)
Glucose-Capillary: 138 mg/dL — ABNORMAL HIGH (ref 70–99)
Glucose-Capillary: 95 mg/dL (ref 70–99)
Glucose-Capillary: 144 mg/dL — ABNORMAL HIGH (ref 70–99)

## 2024-02-21 MED ORDER — INSULIN ASPART 100 UNIT/ML IJ SOLN: 0.0000 [IU] | Freq: Every day | INTRAMUSCULAR | Status: DC

## 2024-02-21 MED ORDER — INSULIN ASPART 100 UNIT/ML IJ SOLN: 0.0000 [IU] | Freq: Three times a day (TID) | INTRAMUSCULAR | Status: DC

## 2024-02-21 MED ORDER — ENOXAPARIN SODIUM 40 MG/0.4ML IJ SOSY
40.0000 mg | PREFILLED_SYRINGE | Freq: Every day | INTRAMUSCULAR | Status: DC
  Administered 2024-02-21 – 2024-03-25 (×28): 40 mg via SUBCUTANEOUS
  Filled 2024-02-21 (×35): qty 0.4

## 2024-02-21 NOTE — Progress Notes (Signed)
 Physical Therapy Treatment Patient Details Name: Amanda [name] MRN: [id number] DOB: August 03, 1960 Today's Date: 02/21/2024   History of Present Illness 63 yo female presents to [facility] on 9/22 with lethargy and near-syncope s/p cocaine and opioid ingestion. 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 S/p R frontal ventriculostomy, burr holes 9/22. ETT 9/22-9/23. PMHx of cocaine abuse, CHF, CKD, chronic cough, HTN and DM2.    PT Comments  Pt tolerates treatment well, ambulating for increased distances. Pt appears to have poor awareness of balance deviations, at least when distracted by conversation. Pt remains at a high risk for falls due to cognitive deficits and impulsivity. PT will follow in an effort to improve gait and balance quality. Patient will benefit from continued inpatient follow up therapy, <3 hours/day.    If plan is discharge home, recommend the following: Supervision due to cognitive status;Assist for transportation;Help with stairs or ramp for entrance;Assistance with cooking/housework;Direct supervision/assist for medications management;Direct supervision/assist for financial management;A lot of help with walking and/or transfers;A lot of help with bathing/dressing/bathroom   Can travel by private vehicle     No  Equipment Recommendations  Rolling walker (2 wheels);BSC/3in1    Recommendations for Other Services       Precautions / Restrictions Precautions Precautions: Fall;Other (comment) Recall of Precautions/Restrictions: Impaired Precaution/Restrictions Comments: PEG, SBP < 160, impulsive Restrictions Weight Bearing Restrictions Per Provider Order: No     Mobility  Bed Mobility Overal bed mobility: Needs Assistance Bed Mobility: Supine to Sit, Sit to Supine     Supine to sit: Min assist Sit to supine: Min assist        Transfers Overall transfer level: Needs  assistance Equipment used: Rolling walker (2 wheels) Transfers: Sit to/from Stand Sit to Stand: Contact guard assist           General transfer comment: increased time to ascend into standing. pt not following cues to push up from mattress and instead pushes with both hands from taller walker    Ambulation/Gait Ambulation/Gait assistance: Mod assist Gait Distance (Feet): 125 Feet Assistive device: Rolling walker (2 wheels) Gait Pattern/deviations: Step-through pattern, Trunk flexed Gait velocity: reduced Gait velocity interpretation: <1.8 ft/sec, indicate of risk for recurrent falls   General Gait Details: pt initially with a strong posterior lean, leaning against PT with poor awareness of posterior lean. Pt progresses to more steady gait but requires constant assistance due to impulsivity and lateral drift.   Stairs             Wheelchair Mobility     Tilt Bed    Modified Rankin (Stroke Patients Only)       Balance Overall balance assessment: Needs assistance Sitting-balance support: No upper extremity supported, Feet supported Sitting balance-Leahy Scale: Fair     Standing balance support: Single extremity supported, Reliant on assistive device for balance Standing balance-Leahy Scale: Poor                              Communication Communication Communication: No apparent difficulties  Cognition Arousal: Alert Behavior During Therapy: Impulsive   PT - Cognitive impairments: Awareness, Memory, Attention, Problem solving, Safety/Judgement                         Following commands: Impaired Following commands impaired: Follows multi-step commands inconsistently, Follows one step commands with increased time    Cueing Cueing Techniques:  Verbal cues, Visual cues, Gestural cues  Exercises      General Comments General comments (skin integrity, edema, etc.): VSS on RA      Pertinent Vitals/Pain Pain Assessment Pain  Assessment: No/denies pain    Home Living                          Prior Function            PT Goals (current goals can now be found in the care plan section) Acute Rehab PT Goals Patient Stated Goal: to go home Time For Goal Achievement: 02/24/24 Progress towards PT goals: Progressing toward goals    Frequency    Min 2X/week      PT Plan      Co-evaluation              AM-PAC PT 6 Clicks Mobility   Outcome Measure  Help needed turning from your back to your side while in a flat bed without using bedrails?: A Little Help needed moving from lying on your back to sitting on the side of a flat bed without using bedrails?: A Little Help needed moving to and from a bed to a chair (including a wheelchair)?: A Little Help needed standing up from a chair using your arms (e.g., wheelchair or bedside chair)?: A Little Help needed to walk in hospital room?: A Lot Help needed climbing 3-5 steps with a railing? : Total 6 Click Score: 15    End of Session Equipment Utilized During Treatment: Gait belt Activity Tolerance: Patient tolerated treatment well Patient left: in bed;with call bell/phone within reach;with bed alarm set Nurse Communication: Mobility status PT Visit Diagnosis: Unsteadiness on feet (R26.81);Difficulty in walking, not elsewhere classified (R26.2);Other abnormalities of gait and mobility (R26.89);Other symptoms and signs involving the nervous system (R29.898);Muscle weakness (generalized) (M62.81)     Time: 8495-8468 PT Time Calculation (min) (ACUTE ONLY): 27 min  Charges:    $Gait Training: 23-37 mins PT General Charges $$ ACUTE PT VISIT: 1 Visit                     Bernardino JINNY Ruth, PT, DPT Acute Rehabilitation Office [phone]    Bernardino JINNY Ruth 02/21/2024, 3:49 PM

## 2024-02-21 NOTE — Plan of Care (Signed)
  Problem: Education: Goal: Ability to describe self-care measures that may prevent or decrease complications (Diabetes Survival Skills Education) will improve Outcome: Progressing   Problem: Coping: Goal: Ability to adjust to condition or change in health will improve Outcome: Progressing   Problem: Fluid Volume: Goal: Ability to maintain a balanced intake and output will improve Outcome: Progressing   Problem: Health Behavior/Discharge Planning: Goal: Ability to identify and utilize available resources and services will improve Outcome: Progressing Goal: Ability to manage health-related needs will improve Outcome: Progressing   Problem: Metabolic: Goal: Ability to maintain appropriate glucose levels will improve Outcome: Progressing   Problem: Nutritional: Goal: Maintenance of adequate nutrition will improve Outcome: Progressing Goal: Progress toward achieving an optimal weight will improve Outcome: Progressing   Problem: Skin Integrity: Goal: Risk for impaired skin integrity will decrease Outcome: Progressing   Problem: Tissue Perfusion: Goal: Adequacy of tissue perfusion will improve Outcome: Progressing   Problem: Education: Goal: Knowledge of General Education information will improve Description: Including pain rating scale, medication(s)/side effects and non-pharmacologic comfort measures Outcome: Progressing

## 2024-02-21 NOTE — Progress Notes (Signed)
 PROGRESS NOTE    Amanda [name]  FMW:[id number] DOB: 11-Jul-1960 DOA: 01/03/2024 PCP: Patient, No Pcp Per   Brief Narrative:  Patient is a 63 year old female with history of hypertension, cocaine abuse, diabetes type 2 who presented with lethargy, near syncope.  Found to be severely hypertensive, UDS positive for cocaine, with acute intraparenchymal hemorrhage , intraventricular hemorrhage and hydrocephalus.  Patient was admitted to ICU for close blood pressure management.  Neurosurgery consulted.  Status post right frontal ventriculostomy. EVD eventually removed on 10/3. Hospital course remarkable for dysphagia.  Speech therapy following.  Dietitian/speech therapy recommending PEG placement because she is not able to tolerate anything by mouth.  PT/OT recommending SNF on discharge.  Underwent PEG placement by IR on 10/16. TOC following    Significant events: 9/22 Admitted and intubated, emergent burr holes and right frontal ventriculostomy placed by neurosurgery due to neurological worsening 9/23 extubated 10/1 EVD clamped 10/3 EVD removed 10/16 PEG placement by IR for worsening dysphagia  Assessment & Plan:   Principal Problem:   ICH (intracerebral hemorrhage) (HCC) Active Problems:   Hypertensive emergency   Tobacco abuse   Cocaine abuse (HCC)   History of alcohol abuse   Chronic combined systolic and diastolic heart failure (HCC)   HTN (hypertension)   Hemorrhagic stroke (HCC)   Malnutrition of moderate degree  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 Continues to receive PT. Plan for discharge to SNF.  EVD removed 01/14/2024, scalp staples removed 02/01/2024. Still pending bed offer.   Hypertension: Currently on carvedilol  25 mg twice daily, Entresto .  Patient's systolic blood pressure has been mostly under 120 and diastolic under 80.  Amlodipine  discontinued due to  history of systolic CHF and blood pressure now optimal.   Dysphagia: S/P PEG placement on 10/16. Now has progressed to dysphagia 3 diet and she has been eating enough and meeting all the nutritional demands, dietitian on board, tube feeds stopped 02/18/2024.  She remains on protein supplements.   Chronic combined systolic/diastolic CHF: Stable, continue Entresto  and spironolactone .  Not on aspirin  due to recent hemorrhage.    Type 2 diabetes: Recent A1c of 6.6. Glucose controlled on sliding scale insulin .   Cocaine abuse: Consulted TOC for cocaine cessation resources.  Delirium/fall: On Seroquel  and as needed soft restraints.  However she has not required any soft restraints since 02/13/2024 per nursing reports. 1. Avoid benzodiazepines, antihistamines, anticholinergics, and minimize opiate use as these may worsen delirium. 2: Assess, prevent and manage pain as lack of treatment can result in delirium.  3: Provide appropriate lighting and clear signage; a clock and calendar should be easily visible to the patient. 4: Monitor environmental factors. Reduce light and noise at night (close shades, turn off lights, turn off TV, ect). Correct any alterations in sleep cycle. 5: Reorient the patient to person, place, time and situation on each encounter.  6: Correct sensory deficits if possible (replace eye glasses, hearing aids, ect). 7: Avoid restraints if able. Severely delirious patients benefit from constant observation by a sitter.  DVT prophylaxis: heparin  injection 5,000 Units Start: 01/05/24 1015 Place and maintain sequential compression device Start: 01/03/24 0611 SCDs Start: 01/03/24 0429   Code Status: Full Code  Family Communication:  None present at bedside.   Status is: Inpatient Remains inpatient appropriate because: Medically stable, pending placement.   Estimated body mass index is 19.19 kg/m as calculated from the following:   Height as of this encounter: 5'  5 (1.651 m).    Weight as of this encounter: 52.3 kg.    Nutritional Assessment: Body mass index is 19.19 kg/m.Amanda [name] [name].  I agree with the assessment and plan as outlined below: Nutrition Status: Nutrition Problem: Moderate Malnutrition Etiology: social / environmental circumstances (polysubstance abuse) Signs/Symptoms: mild muscle depletion, moderate fat depletion, mild fat depletion, moderate muscle depletion Interventions: Prostat, Tube feeding  . Skin Assessment: I have examined the patient's skin and I agree with the wound assessment as performed by the wound care RN as outlined below:    Consultants:  Neurosurgery and IR  Procedures:  As above  Antimicrobials:  Anti-infectives (From admission, onward)    Start     Dose/Rate Route Frequency Ordered Stop   01/27/24 0807  ceFAZolin (ANCEF) IVPB 2g/100 mL premix        over 30 Minutes Intravenous Continuous PRN 01/27/24 0807 01/27/24 0807   01/13/24 0600  ceFAZolin (ANCEF) IVPB 2g/100 mL premix        2 g 200 mL/hr over 30 Minutes Intravenous On call to O.R. 01/11/24 2300 01/13/24 0557         Subjective: Seen and examined.  She has no complaints.  She is fully alert and oriented.  Objective: Vitals:   02/20/24 1953 02/20/24 2351 02/21/24 0500 02/21/24 0743  BP: 120/89 114/70 114/76 115/75  Pulse: 83 65 (!) 59 79  Resp: 18 16 18 18   Temp: 98.7 F (37.1 C) 98.6 F (37 C)  98.1 F (36.7 C)  TempSrc: Oral Axillary  Oral  SpO2: 100% 100% 100% 98%  Weight:      Height:        Intake/Output Summary (Last 24 hours) at 02/21/2024 0826 Last data filed at 02/21/2024 0600 Gross per 24 hour  Intake 820 ml  Output 1700 ml  Net -880 ml   Filed Weights   02/18/24 0417 02/19/24 0610 02/20/24 0500  Weight: 53.8 kg 52.5 kg 52.3 kg    Examination:  General exam: Appears calm and comfortable  Respiratory system: Clear to auscultation. Respiratory effort normal. Cardiovascular system: S1 & S2 heard, RRR. No JVD,  murmurs, rubs, gallops or clicks. No pedal edema. Gastrointestinal system: Abdomen is nondistended, soft and nontender. No organomegaly or masses felt. Normal bowel sounds heard. Central nervous system: Alert and oriented x 2.  Left hemiparesis. Extremities: Symmetric 5 x 5 power.   Data Reviewed: I have personally reviewed following labs and imaging studies  CBC: No results for input(s): WBC, NEUTROABS, HGB, HCT, MCV, PLT in the last 168 hours.  Basic Metabolic Panel: No results for input(s): NA, K, CL, CO2, GLUCOSE, BUN, CREATININE, CALCIUM, MG, PHOS in the last 168 hours.  GFR: CrCl cannot be calculated (Patient's most recent lab result is older than the maximum 21 days allowed.). Liver Function Tests: No results for input(s): AST, ALT, ALKPHOS, BILITOT, PROT, ALBUMIN in the last 168 hours. No results for input(s): LIPASE, AMYLASE in the last 168 hours. No results for input(s): AMMONIA in the last 168 hours. Coagulation Profile: No results for input(s): INR, PROTIME in the last 168 hours.  Cardiac Enzymes: No results for input(s): CKTOTAL, CKMB, CKMBINDEX, TROPONINI in the last 168 hours. BNP (last 3 results) No results for input(s): PROBNP in the last 8760 hours. HbA1C: No results for input(s): HGBA1C in the last 72 hours. CBG: Recent Labs  Lab 02/20/24 1605 02/20/24 1952 02/20/24 2356 02/21/24 0451 02/21/24 0803  GLUCAP 125* 108* 96 92 113*   Lipid  Profile: No results for input(s): CHOL, HDL, LDLCALC, TRIG, CHOLHDL, LDLDIRECT in the last 72 hours. Thyroid Function Tests: No results for input(s): TSH, T4TOTAL, FREET4, T3FREE, THYROIDAB in the last 72 hours. Anemia Panel: No results for input(s): VITAMINB12, FOLATE, FERRITIN, TIBC, IRON, RETICCTPCT in the last 72 hours. Sepsis Labs: No results for input(s): PROCALCITON, LATICACIDVEN in the last 168 hours.  No  results found for this or any previous visit (from the past 240 hours).   Radiology Studies: No results found.  Scheduled Meds:  carvedilol   25 mg Oral BID WC   feeding supplement  237 mL Oral TID BM   free water   50 mL Per Tube Daily   heparin  injection (subcutaneous)  5,000 Units Subcutaneous Q8H   insulin  aspart  0-9 Units Subcutaneous Q4H   mineral oil-hydrophilic petrolatum    Topical BID   mouth rinse  15 mL Mouth Rinse 4 times per day   QUEtiapine   25 mg Oral QHS   sacubitril -valsartan   1 tablet Oral BID   spironolactone   25 mg Oral Daily   Continuous Infusions:     LOS: 49 days   Fredia Skeeter, MD [facility]  02/21/2024, 8:26 AM   *Please note that this is a verbal dictation therefore any spelling or grammatical errors are due to the Dragon Medical One system interpretation.  Please page via Amion and do not message via secure chat for urgent patient care matters. Secure chat can be used for non urgent patient care matters.  How to contact the [facility] Attending or Consulting provider 7A - 7P or covering provider during after hours 7P -7A, for this patient?  Check the care team in [facility] and look for a) attending/consulting [facility] provider listed and b) the [facility] team listed. Page or secure chat 7A-7P. Log into [email or web address] and use [place]'s universal password to access. If you do not have the password, please contact the hospital operator. Locate the [facility] provider you are looking for under [facility] and page to a number that you can be directly reached. If you still have difficulty reaching the provider, please page the [facility] (Director on Call) for the Hospitalists listed on amion for assistance.

## 2024-02-22 DIAGNOSIS — I610 Nontraumatic intracerebral hemorrhage in hemisphere, subcortical: Secondary | ICD-10-CM | POA: Diagnosis not present

## 2024-02-22 LAB — GLUCOSE, CAPILLARY
Glucose-Capillary: 107 mg/dL — ABNORMAL HIGH (ref 70–99)
Glucose-Capillary: 124 mg/dL — ABNORMAL HIGH (ref 70–99)
Glucose-Capillary: 158 mg/dL — ABNORMAL HIGH (ref 70–99)
Glucose-Capillary: 130 mg/dL — ABNORMAL HIGH (ref 70–99)
Glucose-Capillary: 115 mg/dL — ABNORMAL HIGH (ref 70–99)

## 2024-02-22 NOTE — Progress Notes (Signed)
 PROGRESS NOTE    Amanda [name]  FMW:[id number] DOB: 1960/09/01 DOA: 01/03/2024 PCP: Amanda [name], No Pcp Per   Brief Narrative:  Amanda [name] is a 63 year old female with history of hypertension, cocaine abuse, diabetes type 2 who presented with lethargy, near syncope.  Found to be severely hypertensive, UDS positive for cocaine, with acute intraparenchymal hemorrhage , intraventricular hemorrhage and hydrocephalus.  Amanda [name] was admitted to ICU for close blood pressure management.  Neurosurgery consulted.  Status post right frontal ventriculostomy. EVD eventually removed on 10/3. Hospital course remarkable for dysphagia.  Speech therapy following.  Dietitian/speech therapy recommending PEG placement because she is not able to tolerate anything by mouth.  PT/OT recommending SNF on discharge.  Underwent PEG placement by IR on 10/16. TOC following    Significant events: 9/22 Admitted and intubated, emergent burr holes and right frontal ventriculostomy placed by neurosurgery due to neurological worsening 9/23 extubated 10/1 EVD clamped 10/3 EVD removed 10/16 PEG placement by IR for worsening dysphagia  Assessment & Plan:   Principal Problem:   ICH (intracerebral hemorrhage) (HCC) Active Problems:   Hypertensive emergency   Tobacco abuse   Cocaine abuse (HCC)   History of alcohol abuse   Chronic combined systolic and diastolic heart failure (HCC)   HTN (hypertension)   Hemorrhagic stroke (HCC)   Malnutrition of moderate degree  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 Continues to receive PT. Plan for discharge to SNF.  EVD removed 01/14/2024, scalp staples removed 02/01/2024. Still pending bed offer.   Hypertension: Currently on carvedilol  25 mg twice daily, Entresto .  Amanda [name]'s systolic blood pressure has been mostly under 120 and diastolic under 80.  Amlodipine  discontinued due to  history of systolic CHF and blood pressure now optimal.   Dysphagia: S/P PEG placement on 10/16. Now has progressed to dysphagia 3 diet and she has been eating enough and meeting all the nutritional demands, dietitian on board, tube feeds stopped 02/18/2024.  She remains on protein supplements.   Chronic combined systolic/diastolic CHF: Stable, continue Entresto  and spironolactone .  Not on aspirin  due to recent hemorrhage.    Type 2 diabetes: Recent A1c of 6.6. Glucose controlled on sliding scale insulin .   Cocaine abuse: Consulted TOC for cocaine cessation resources.  Delirium/fall: On Seroquel  and as needed soft restraints.  However she has not required any soft restraints since 02/13/2024 per nursing reports. 1. Avoid benzodiazepines, antihistamines, anticholinergics, and minimize opiate use as these may worsen delirium. 2: Assess, prevent and manage pain as lack of treatment can result in delirium.  3: Provide appropriate lighting and clear signage; a clock and calendar should be easily visible to the Amanda [name]. 4: Monitor environmental factors. Reduce light and noise at night (close shades, turn off lights, turn off TV, ect). Correct any alterations in sleep cycle. 5: Reorient the Amanda [name] to person, place, time and situation on each encounter.  6: Correct sensory deficits if possible (replace eye glasses, hearing aids, ect). 7: Avoid restraints if able. Severely delirious patients benefit from constant observation by a sitter.  DVT prophylaxis: enoxaparin  (LOVENOX ) injection 40 mg Start: 02/21/24 1430 Place and maintain sequential compression device Start: 01/03/24 0611 SCDs Start: 01/03/24 0429   Code Status: Full Code  Family Communication:  None present at bedside.   Status is: Inpatient Remains inpatient appropriate because: Medically stable, pending placement.   Estimated body mass index is 20.1 kg/m as calculated from the following:   Height as of this encounter:  5' 5 (1.651  m).   Weight as of this encounter: 54.8 kg.    Nutritional Assessment: Body mass index is 20.1 kg/m.[name] [name].  I agree with the assessment and plan as outlined below: Nutrition Status: Nutrition Problem: Moderate Malnutrition Etiology: social / environmental circumstances (polysubstance abuse) Signs/Symptoms: mild muscle depletion, moderate fat depletion, mild fat depletion, moderate muscle depletion Interventions: Prostat, Tube feeding  . Skin Assessment: I have examined the Amanda [name]'s skin and I agree with the wound assessment as performed by the wound care RN as outlined below:    Consultants:  Neurosurgery and IR  Procedures:  As above  Antimicrobials:  Anti-infectives (From admission, onward)    Start     Dose/Rate Route Frequency Ordered Stop   01/27/24 0807  ceFAZolin (ANCEF) IVPB 2g/100 mL premix        over 30 Minutes Intravenous Continuous PRN 01/27/24 0807 01/27/24 0807   01/13/24 0600  ceFAZolin (ANCEF) IVPB 2g/100 mL premix        2 g 200 mL/hr over 30 Minutes Intravenous On call to O.R. 01/11/24 2300 01/13/24 0557         Subjective: Seen and examined.  Doing well.  No complaints.  Fully alert and oriented.  Objective: Vitals:   02/21/24 2000 02/22/24 0000 02/22/24 0400 02/22/24 0441  BP: 96/83 98/72 103/74   Pulse: 78 74 72   Resp: 18 18 19    Temp: 98.6 F (37 C) 98.1 F (36.7 C) 99.2 F (37.3 C)   TempSrc: Oral Axillary Axillary   SpO2: 94% 95% 94%   Weight:    54.8 kg  Height:        Intake/Output Summary (Last 24 hours) at 02/22/2024 0754 Last data filed at 02/21/2024 2000 Gross per 24 hour  Intake 80 ml  Output --  Net 80 ml   Filed Weights   02/19/24 0610 02/20/24 0500 02/22/24 0441  Weight: 52.5 kg 52.3 kg 54.8 kg    Examination:  General exam: Appears calm and comfortable  Respiratory system: Clear to auscultation. Respiratory effort normal. Cardiovascular system: S1 & S2 heard, RRR. No JVD, murmurs, rubs,  gallops or clicks. No pedal edema. Gastrointestinal system: Abdomen is nondistended, soft and nontender. No organomegaly or masses felt. Normal bowel sounds heard. Central nervous system: Alert and oriented x 2.  Left hemiparesis. Extremities: Symmetric 5 x 5 power.   Data Reviewed: I have personally reviewed following labs and imaging studies  CBC: No results for input(s): WBC, NEUTROABS, HGB, HCT, MCV, PLT in the last 168 hours.  Basic Metabolic Panel: No results for input(s): NA, K, CL, CO2, GLUCOSE, BUN, CREATININE, CALCIUM, MG, PHOS in the last 168 hours.  GFR: CrCl cannot be calculated (Amanda [name]'s most recent lab result is older than the maximum 21 days allowed.). Liver Function Tests: No results for input(s): AST, ALT, ALKPHOS, BILITOT, PROT, ALBUMIN in the last 168 hours. No results for input(s): LIPASE, AMYLASE in the last 168 hours. No results for input(s): AMMONIA in the last 168 hours. Coagulation Profile: No results for input(s): INR, PROTIME in the last 168 hours.  Cardiac Enzymes: No results for input(s): CKTOTAL, CKMB, CKMBINDEX, TROPONINI in the last 168 hours. BNP (last 3 results) No results for input(s): PROBNP in the last 8760 hours. HbA1C: No results for input(s): HGBA1C in the last 72 hours. CBG: Recent Labs  Lab 02/21/24 0803 02/21/24 1151 02/21/24 1557 02/21/24 2038 02/22/24 0611  GLUCAP 113* 138* 95 144* 107*   Lipid Profile:  No results for input(s): CHOL, HDL, LDLCALC, TRIG, CHOLHDL, LDLDIRECT in the last 72 hours. Thyroid Function Tests: No results for input(s): TSH, T4TOTAL, FREET4, T3FREE, THYROIDAB in the last 72 hours. Anemia Panel: No results for input(s): VITAMINB12, FOLATE, FERRITIN, TIBC, IRON, RETICCTPCT in the last 72 hours. Sepsis Labs: No results for input(s): PROCALCITON, LATICACIDVEN in the last 168 hours.  No results found for  this or any previous visit (from the past 240 hours).   Radiology Studies: No results found.  Scheduled Meds:  carvedilol   25 mg Oral BID WC   enoxaparin  (LOVENOX ) injection  40 mg Subcutaneous Daily   feeding supplement  237 mL Oral TID BM   free water   50 mL Per Tube Daily   mineral oil-hydrophilic petrolatum    Topical BID   mouth rinse  15 mL Mouth Rinse 4 times per day   QUEtiapine   25 mg Oral QHS   sacubitril -valsartan   1 tablet Oral BID   spironolactone   25 mg Oral Daily   Continuous Infusions:     LOS: 50 days   Fredia Skeeter, MD [facility]  02/22/2024, 7:54 AM   *Please note that this is a verbal dictation therefore any spelling or grammatical errors are due to the Dragon Medical One system interpretation.  Please page via Amion and do not message via secure chat for urgent Amanda [name] care matters. Secure chat can be used for non urgent Amanda [name] care matters.  How to contact the [facility] Attending or Consulting provider 7A - 7P or covering provider during after hours 7P -7A, for this Amanda [name]?  Check the care team in [facility] and look for a) attending/consulting [facility] provider listed and b) the [facility] team listed. Page or secure chat 7A-7P. Log into [email or web address] and use [place]'s universal password to access. If you do not have the password, please contact the hospital operator. Locate the [facility] provider you are looking for under [facility] and page to a number that you can be directly reached. If you still have difficulty reaching the provider, please page the [facility] (Director on Call) for the Hospitalists listed on amion for assistance.

## 2024-02-22 NOTE — Progress Notes (Signed)
 Nutrition Follow-up  DOCUMENTATION CODES:  Non-severe (moderate) malnutrition in context of social or environmental circumstances (polysubstance abuse)  INTERVENTION:  Continue current diet as ordered per SLP Encourage PO intake  Ensure Plus High Protein po TID, each supplement provides 350 kcal and 20 grams of protein MVI with minerals daily   NUTRITION DIAGNOSIS:  Moderate Malnutrition related to social / environmental circumstances (polysubstance abuse) as evidenced by mild muscle depletion, moderate fat depletion, mild fat depletion, moderate muscle depletion. - remains applicable  GOAL:  Patient will meet greater than or equal to 90% of their needs - met with TF at goal  MONITOR:  PO intake, Supplement acceptance, Labs, Skin  REASON FOR ASSESSMENT:  Consult Enteral/tube feeding initiation and management  ASSESSMENT:  Pt with hx HTN, CHF, type 2 diabetes, polysubstance abuse, and pulmonary edema. Admitted with AMS, lethargy, and deteriorating condition requiring intubation; diagnosed ICH.  9/22 - admitted, intubated, EVD placed 9/23 - extubated 9/24 - s/p cortrak placement; tip gastric  9/25 - Diet advanced to Dysphagia 2/thin; TF stopped 9/26 - back down to Full liquids, TF resumed  10/13- upgraded to DYS 3, but SLP recommends PEG 10/16 - PEG placed in IR 11/7 - pt meeting nutrition needs s/p kcal count, TF discontinued  Pt resting in bed at the time of assessment. Continues to be awake and interactive but poor historian. Discussed intake with RN. Reports that pt continues to eat well and is consuming supplements. Will continue current nutrition interventions at this time and continue to monitor weight to ensure pt is meeting needs.   Admit weight: 56.1 kg  Current weight: 54.8 kg    Average Meal Intake: 10/6-10/8: 0% intake x 4 recorded meals 10/6-10/12: 0% intake x 7 recorded meals 10/14-10/15: 14% x 4 recorded meals 10/24-11/4: 57% intake x 6 recorded  meals 11/6-11/11: 79% intake x 4 recorded meals  Nutritionally Relevant Medications: Scheduled Meds:  Ensure Plus High Protein  237 mL Oral TID BM   free water   50 mL Per Tube Daily   spironolactone   25 mg Oral Daily   PRN Meds: polyethylene glycol  Labs Reviewed: HgbA1c 6.6%  Diet Order:   Diet Order             DIET DYS 3 Room service appropriate? Yes; Fluid consistency: Thin  Diet effective now                  EDUCATION NEEDS:  Not appropriate for education at this time  Skin:  Skin Assessment: Skin Integrity Issues: Skin Integrity Issues:: Incisions Incisions: closed surgical incision head  Last BM:  11/11 - type 4  Height:  Ht Readings from Last 1 Encounters:  01/03/24 5' 5 (1.651 m)    Weight:  Wt Readings from Last 1 Encounters:  02/22/24 54.8 kg    Ideal Body Weight:  56.8 kg  BMI:  Body mass index is 20.1 kg/m.  Estimated Nutritional Needs:  Kcal:  1500-1700 Protein:  80-90 grams Fluid:  1.5-1.7L   Vernell Lukes, RD, LDN, CNSC Registered Dietitian II Please reach out via secure chat

## 2024-02-22 NOTE — Plan of Care (Signed)
 Sat up in the chair for much of the day and was very talkative and interactive.  Does get confused and is easily redirected.  [place] today was a good day.  Problem: Coping: Goal: Ability to adjust to condition or change in health will improve Outcome: Progressing   Problem: Metabolic: Goal: Ability to maintain appropriate glucose levels will improve Outcome: Progressing   Problem: Activity: Goal: Risk for activity intolerance will decrease Outcome: Progressing   Problem: Nutrition: Goal: Adequate nutrition will be maintained Outcome: Progressing   Problem: Coping: Goal: Level of anxiety will decrease Outcome: Progressing

## 2024-02-22 NOTE — Progress Notes (Signed)
 Occupational Therapy Treatment Patient Details Name: Amanda [name] MRN: [id number] DOB: Aug 04, 1960 Today's Date: 02/22/2024   History of present illness 63 yo female presents to [facility] 9/22 with lethargy and near-syncope s/p cocaine and opioid ingestion. 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 S/p R frontal ventriculostomy, burr holes 9/22. ETT 9/22-9/23. PMHx of cocaine abuse, CHF, CKD, chronic cough, HTN and DM2.   OT comments  5/5 goals upgraded this session and pt continues to progress. Pt limited by cognition, L inattention, poor safety awareness, balance. Pt needing constant cues to orient her body to task at hand during session while performing grooming at sink, attempting to sit in a chair, and getting hand sanitizer in hall. Pt with poor attention to L side of environment and needing min-mod A to navigate with RW. Patient will benefit from continued inpatient follow up therapy, <3 hours/day       If plan is discharge home, recommend the following:  Two people to help with walking and/or transfers;A lot of help with bathing/dressing/bathroom;Assistance with cooking/housework;Direct supervision/assist for medications management;Direct supervision/assist for financial management;Assist for transportation;Help with stairs or ramp for entrance   Equipment Recommendations  Other (comment) (defer)    Recommendations for Other Services      Precautions / Restrictions Precautions Precautions: Fall;Other (comment) Recall of Precautions/Restrictions: Impaired Precaution/Restrictions Comments: PEG, SBP < 160, impulsive Restrictions Weight Bearing Restrictions Per Provider Order: No       Mobility Bed Mobility Overal bed mobility: Needs Assistance Bed Mobility: Supine to Sit     Supine to sit: Min assist     General bed mobility comments: cues for technique and increased time     Transfers Overall transfer level: Needs assistance Equipment used: Rolling walker (2 wheels) Transfers: Sit to/from Stand Sit to Stand: Contact guard assist           General transfer comment: increased time to ascend into standing. pt not following cues to push up from mattress and instead pushes with both hands from taller walker     Balance Overall balance assessment: Needs assistance Sitting-balance support: No upper extremity supported, Feet supported Sitting balance-Leahy Scale: Fair Sitting balance - Comments: CGA sitting EOB   Standing balance support: Reliant on assistive device for balance, Bilateral upper extremity supported Standing balance-Leahy Scale: Poor Standing balance comment: reliant on RW and external support for stability                           ADL either performed or assessed with clinical judgement   ADL Overall ADL's : Needs assistance/impaired     Grooming: Wash/dry hands;Minimal assistance;Standing Grooming Details (indicate cue type and reason): max cues for orientation of body to sink with pt bringing R foot toward sink and attempting to wash hands with body oriented to L and torso twisted toward sink. min A for balance throughout                 Toilet Transfer: Minimal assistance;Rolling walker (2 wheels);Moderate assistance;Ambulation;Comfort height toilet Toilet Transfer Details (indicate cue type and reason): assistance for balance and environmental navigation         Functional mobility during ADLs: Minimal assistance;Moderate assistance;Rolling walker (2 wheels)      Extremity/Trunk Assessment Upper Extremity Assessment Upper Extremity Assessment: Generalized weakness   Lower Extremity Assessment Lower Extremity Assessment: Defer to PT evaluation        Vision  Vision Assessment?: Vision impaired- to be further tested in functional context Additional Comments: pt able to locate items on L and R at sink  with increased time, but poor attention to L side of enviromnent during mobility however, and often difficulty with orienting herself to task at hand (orienting body to sink, or turning far enough to get bottom in front of chair before sititng)   Perception Perception Perception: Impaired Preception Impairment Details: Inattention/Neglect Perception-Other Comments: L inattention   Praxis Praxis Praxis: Not tested   Communication Communication Communication: No apparent difficulties   Cognition Arousal: Alert Behavior During Therapy: Impulsive Cognition: Cognition impaired   Orientation impairments: Situation Awareness: Intellectual awareness impaired Memory impairment (select all impairments): Short-term memory, Working memory Attention impairment (select first level of impairment): Sustained attention Executive functioning impairment (select all impairments): Initiation, Sequencing, Reasoning, Problem solving OT - Cognition Comments: Continues to be impulsive but pleasant, needs significant cueing for safely navigating environment at times. once seated in chair constantly alerting RN station on call bell but then stating I didnt call you, you called me unclear if she was attempting to control television or very confused. continued to do so despite orientation to function of call light features                 Following commands: Impaired Following commands impaired: Follows multi-step commands inconsistently, Follows one step commands with increased time      Cueing   Cueing Techniques: Verbal cues, Visual cues, Gestural cues  Exercises      Shoulder Instructions       General Comments      Pertinent Vitals/ Pain       Pain Assessment Pain Assessment: No/denies pain  Home Living                                          Prior Functioning/Environment              Frequency  Min 1X/week        Progress Toward Goals  OT  Goals(current goals can now be found in the care plan section)  Progress towards OT goals: Progressing toward goals  Acute Rehab OT Goals Patient Stated Goal: go to the store OT Goal Formulation: With patient Time For Goal Achievement: 03/07/24 Potential to Achieve Goals: Fair ADL Goals Pt Will Perform Eating: with set-up;sitting Pt Will Perform Grooming: with contact guard assist;standing Pt Will Transfer to Toilet: with contact guard assist;ambulating Additional ADL Goal #1: Pt will attend to items on L side and properly orient self to sink/task at hand 50% of the time, in preparation for ADLs performed in standing. Additional ADL Goal #2: Pt will follow 1-step commands 100% of the time during ADLs and functional mobility to demonstrate improved attention.  Plan      Co-evaluation                 AM-PAC OT 6 Clicks Daily Activity     Outcome Measure   Help from another person eating meals?: A Little Help from another person taking care of personal grooming?: A Little Help from another person toileting, which includes using toliet, bedpan, or urinal?: A Lot Help from another person bathing (including washing, rinsing, drying)?: A Lot Help from another person to put on and taking off regular upper body clothing?: A Little Help from another person to put on and  taking off regular lower body clothing?: A Lot 6 Click Score: 15    End of Session Equipment Utilized During Treatment: Gait belt;Rolling walker (2 wheels)  OT Visit Diagnosis: Unsteadiness on feet (R26.81);Other abnormalities of gait and mobility (R26.89);Muscle weakness (generalized) (M62.81)   Activity Tolerance Patient tolerated treatment well   Patient Left in bed;with call bell/phone within reach;with bed alarm set   Nurse Communication Mobility status        Time: 8693-8669 OT Time Calculation (min): 24 min  Charges: OT General Charges $OT Visit: 1 Visit OT Treatments $Self Care/Home Management  : 23-37 mins  Elma JONETTA Lebron FREDERICK, OTR/L [facility] Acute Rehabilitation Office: [phone]   Elma JONETTA Lebron 02/22/2024, 5:12 PM

## 2024-02-23 DIAGNOSIS — I610 Nontraumatic intracerebral hemorrhage in hemisphere, subcortical: Secondary | ICD-10-CM | POA: Diagnosis not present

## 2024-02-23 LAB — GLUCOSE, CAPILLARY
Glucose-Capillary: 109 mg/dL — ABNORMAL HIGH (ref 70–99)
Glucose-Capillary: 123 mg/dL — ABNORMAL HIGH (ref 70–99)
Glucose-Capillary: 136 mg/dL — ABNORMAL HIGH (ref 70–99)
Glucose-Capillary: 137 mg/dL — ABNORMAL HIGH (ref 70–99)

## 2024-02-23 NOTE — Progress Notes (Signed)
 PROGRESS NOTE    LATERRICA LIBMAN  FMW:[id number] DOB: 04-04-1961 DOA: 01/03/2024 PCP: Patient, No Pcp Per   Brief Narrative:  Patient is a 63 year old female with history of hypertension, cocaine abuse, diabetes type 2 who presented with lethargy, near syncope.  Found to be severely hypertensive, UDS positive for cocaine, with acute intraparenchymal hemorrhage , intraventricular hemorrhage and hydrocephalus.  Patient was admitted to ICU for close blood pressure management.  Neurosurgery consulted.  Status post right frontal ventriculostomy. EVD eventually removed on 10/3. Hospital course remarkable for dysphagia.  Speech therapy following.  Dietitian/speech therapy recommending PEG placement because she is not able to tolerate anything by mouth.  PT/OT recommending SNF on discharge.  Underwent PEG placement by IR on 10/16. TOC following    Significant events: 9/22 Admitted and intubated, emergent burr holes and right frontal ventriculostomy placed by neurosurgery due to neurological worsening 9/23 extubated 10/1 EVD clamped 10/3 EVD removed 10/16 PEG placement by IR for worsening dysphagia  Assessment & Plan:   Principal Problem:   ICH (intracerebral hemorrhage) (HCC) Active Problems:   Hypertensive emergency   Tobacco abuse   Cocaine abuse (HCC)   History of alcohol abuse   Chronic combined systolic and diastolic heart failure (HCC)   HTN (hypertension)   Hemorrhagic stroke (HCC)   Malnutrition of moderate degree  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 Continues to receive PT. Plan for discharge to SNF.  EVD removed 01/14/2024, scalp staples removed 02/01/2024. Still pending bed offer.   Hypertension: Currently on carvedilol  25 mg twice daily, Entresto .  Patient's systolic blood pressure has been mostly under 120 and diastolic under 80.  Amlodipine  discontinued due to  history of systolic CHF and blood pressure now optimal.   Dysphagia: S/P PEG placement on 10/16. Now has progressed to dysphagia 3 diet and she has been eating enough and meeting all the nutritional demands, dietitian on board, tube feeds stopped 02/18/2024.  She remains on protein supplements.   Chronic combined systolic/diastolic CHF: Stable, continue Entresto  and spironolactone .  Not on aspirin  due to recent hemorrhage.    Type 2 diabetes: Recent A1c of 6.6. Glucose controlled on sliding scale insulin .   Cocaine abuse: Consulted TOC for cocaine cessation resources.  Delirium/fall: On Seroquel  and as needed soft restraints.  However she has not required any soft restraints since 02/13/2024 per nursing reports. 1. Avoid benzodiazepines, antihistamines, anticholinergics, and minimize opiate use as these may worsen delirium. 2: Assess, prevent and manage pain as lack of treatment can result in delirium.  3: Provide appropriate lighting and clear signage; a clock and calendar should be easily visible to the patient. 4: Monitor environmental factors. Reduce light and noise at night (close shades, turn off lights, turn off TV, ect). Correct any alterations in sleep cycle. 5: Reorient the patient to person, place, time and situation on each encounter.  6: Correct sensory deficits if possible (replace eye glasses, hearing aids, ect). 7: Avoid restraints if able. Severely delirious patients benefit from constant observation by a sitter.  DVT prophylaxis: enoxaparin  (LOVENOX ) injection 40 mg Start: 02/21/24 1430 Place and maintain sequential compression device Start: 01/03/24 0611 SCDs Start: 01/03/24 0429   Code Status: Full Code  Family Communication:  None present at bedside.   Status is: Inpatient Remains inpatient appropriate because: Medically stable, pending placement.   Estimated body mass index is 19.33 kg/m as calculated from the following:   Height as of this encounter:  5' 5 (1.651  m).   Weight as of this encounter: 52.7 kg.    Nutritional Assessment: Body mass index is 19.33 kg/m.[name] [name].  I agree with the assessment and plan as outlined below: Nutrition Status: Nutrition Problem: Moderate Malnutrition Etiology: social / environmental circumstances (polysubstance abuse) Signs/Symptoms: mild muscle depletion, moderate fat depletion, mild fat depletion, moderate muscle depletion Interventions: Ensure Enlive (each supplement provides 350kcal and 20 grams of protein), MVI  . Skin Assessment: I have examined the patient's skin and I agree with the wound assessment as performed by the wound care RN as outlined below:    Consultants:  Neurosurgery and IR  Procedures:  As above  Antimicrobials:  Anti-infectives (From admission, onward)    Start     Dose/Rate Route Frequency Ordered Stop   01/27/24 0807  ceFAZolin (ANCEF) IVPB 2g/100 mL premix        over 30 Minutes Intravenous Continuous PRN 01/27/24 0807 01/27/24 0807   01/13/24 0600  ceFAZolin (ANCEF) IVPB 2g/100 mL premix        2 g 200 mL/hr over 30 Minutes Intravenous On call to O.R. 01/11/24 2300 01/13/24 0557         Subjective: Patient seen and examined.  Remains stable, alert and oriented at her baseline.  Objective: Vitals:   02/22/24 2338 02/23/24 0338 02/23/24 0607 02/23/24 0738  BP: 93/69   (!) 157/104  Pulse: 69   79  Resp: 17 19  18   Temp: 97.8 F (36.6 C)   97.6 F (36.4 C)  TempSrc: Oral   Oral  SpO2: 93%   100%  Weight:   52.7 kg   Height:        Intake/Output Summary (Last 24 hours) at 02/23/2024 0802 Last data filed at 02/22/2024 1029 Gross per 24 hour  Intake 290 ml  Output --  Net 290 ml   Filed Weights   02/20/24 0500 02/22/24 0441 02/23/24 0607  Weight: 52.3 kg 54.8 kg 52.7 kg    Examination:  General exam: Appears calm and comfortable  Respiratory system: Clear to auscultation. Respiratory effort normal. Cardiovascular system: S1 & S2 heard,  RRR. No JVD, murmurs, rubs, gallops or clicks. No pedal edema. Gastrointestinal system: Abdomen is nondistended, soft and nontender. No organomegaly or masses felt. Normal bowel sounds heard. Central nervous system: Alert and oriented x 2.  Left hemiparesis. Extremities: Symmetric 5 x 5 power.   Data Reviewed: I have personally reviewed following labs and imaging studies  CBC: No results for input(s): WBC, NEUTROABS, HGB, HCT, MCV, PLT in the last 168 hours.  Basic Metabolic Panel: No results for input(s): NA, K, CL, CO2, GLUCOSE, BUN, CREATININE, CALCIUM, MG, PHOS in the last 168 hours.  GFR: CrCl cannot be calculated (Patient's most recent lab result is older than the maximum 21 days allowed.). Liver Function Tests: No results for input(s): AST, ALT, ALKPHOS, BILITOT, PROT, ALBUMIN in the last 168 hours. No results for input(s): LIPASE, AMYLASE in the last 168 hours. No results for input(s): AMMONIA in the last 168 hours. Coagulation Profile: No results for input(s): INR, PROTIME in the last 168 hours.  Cardiac Enzymes: No results for input(s): CKTOTAL, CKMB, CKMBINDEX, TROPONINI in the last 168 hours. BNP (last 3 results) No results for input(s): PROBNP in the last 8760 hours. HbA1C: No results for input(s): HGBA1C in the last 72 hours. CBG: Recent Labs  Lab 02/22/24 1149 02/22/24 1545 02/22/24 2032 02/23/24 0016 02/23/24 0751  GLUCAP 158* 130* 115*  136* 109*   Lipid Profile: No results for input(s): CHOL, HDL, LDLCALC, TRIG, CHOLHDL, LDLDIRECT in the last 72 hours. Thyroid Function Tests: No results for input(s): TSH, T4TOTAL, FREET4, T3FREE, THYROIDAB in the last 72 hours. Anemia Panel: No results for input(s): VITAMINB12, FOLATE, FERRITIN, TIBC, IRON, RETICCTPCT in the last 72 hours. Sepsis Labs: No results for input(s): PROCALCITON, LATICACIDVEN in the last 168  hours.  No results found for this or any previous visit (from the past 240 hours).   Radiology Studies: No results found.  Scheduled Meds:  carvedilol   25 mg Oral BID WC   enoxaparin  (LOVENOX ) injection  40 mg Subcutaneous Daily   feeding supplement  237 mL Oral TID BM   free water   50 mL Per Tube Daily   mineral oil-hydrophilic petrolatum    Topical BID   mouth rinse  15 mL Mouth Rinse 4 times per day   QUEtiapine   25 mg Oral QHS   sacubitril -valsartan   1 tablet Oral BID   spironolactone   25 mg Oral Daily   Continuous Infusions:     LOS: 51 days   Fredia Skeeter, MD [facility]  02/23/2024, 8:02 AM   *Please note that this is a verbal dictation therefore any spelling or grammatical errors are due to the Dragon Medical One system interpretation.  Please page via Amion and do not message via secure chat for urgent patient care matters. Secure chat can be used for non urgent patient care matters.  How to contact the [facility] Attending or Consulting provider 7A - 7P or covering provider during after hours 7P -7A, for this patient?  Check the care team in [facility] and look for a) attending/consulting [facility] provider listed and b) the [facility] team listed. Page or secure chat 7A-7P. Log into [email or web address] and use [facility]'s universal password to access. If you do not have the password, please contact the hospital operator. Locate the [facility] provider you are looking for under [facility] and page to a number that you can be directly reached. If you still have difficulty reaching the provider, please page the [facility] (Director on Call) for the Hospitalists listed on amion for assistance.

## 2024-02-23 NOTE — Progress Notes (Signed)
 Speech Language Pathology Treatment: Cognitive-Linguistic  Patient Details Name: TENECIA IGNASIAK MRN: [id number] DOB: 1960-12-19 Today's Date: 02/23/2024 Time: 8579-8564 SLP Time Calculation (min) (ACUTE ONLY): 15 min  Assessment / Plan / Recommendation Clinical Impression  Brought some activities for working on alternating attention and left neglect, but pt limited by neck pain today and didn't want to sit up. Pt more accurate in basic intellectual awareness of situation, recent events. Pt able to answer concrete questions, but when asked a reasoning or hypothetical question language structure and topic maintenance are absent. Pt does respond well to yes and no questions about her situation to help respond to more complicated abstract questions. Still very inaccurate when discussing safety awareness for mobility. Working civil service fast streamer and attention are impacting accuracy in simple tasks - using call bell appropriately. Will continue efforts sustaining attention to complete simple functional tasks in a quiet environment.     HPI HPI: [name] with polysubstance/cocaine abuse 3 hrs before presentation, who called EMS for AMS, lethargy and near syncope. Her condition deteriorated (unable to protect her airway and more lethargic) and got intubated. 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 HTN, CHF, DM-2.      SLP Plan  Continue with current plan of care          Recommendations                                 Continue with current plan of care     Geo Slone, Consuelo Fitch  02/23/2024, 2:43 PM

## 2024-02-23 NOTE — Plan of Care (Signed)
  Problem: Health Behavior/Discharge Planning: Goal: Ability to identify and utilize available resources and services will improve Outcome: Progressing Goal: Ability to manage health-related needs will improve Outcome: Progressing   Problem: Nutritional: Goal: Maintenance of adequate nutrition will improve Outcome: Progressing Goal: Progress toward achieving an optimal weight will improve Outcome: Progressing   Problem: Skin Integrity: Goal: Risk for impaired skin integrity will decrease Outcome: Progressing   Problem: Clinical Measurements: Goal: Ability to maintain clinical measurements within normal limits will improve Outcome: Progressing Goal: Will remain free from infection Outcome: Progressing Goal: Diagnostic test results will improve Outcome: Progressing Goal: Respiratory complications will improve Outcome: Progressing Goal: Cardiovascular complication will be avoided Outcome: Progressing   Problem: Activity: Goal: Risk for activity intolerance will decrease Outcome: Progressing   Problem: Elimination: Goal: Will not experience complications related to bowel motility Outcome: Progressing Goal: Will not experience complications related to urinary retention Outcome: Progressing   Problem: Nutrition: Goal: Adequate nutrition will be maintained Outcome: Progressing   Problem: Pain Managment: Goal: General experience of comfort will improve and/or be controlled Outcome: Progressing   Problem: Safety: Goal: Ability to remain free from injury will improve Outcome: Progressing   Problem: Skin Integrity: Goal: Risk for impaired skin integrity will decrease Outcome: Progressing

## 2024-02-23 NOTE — Progress Notes (Signed)
 Physical Therapy Treatment Patient Details Name: Amanda [name] MRN: [id number] DOB: 07-May-1960 Today's Date: 02/23/2024   History of Present Illness 63 yo female presents to [facility] on 9/22 with lethargy and near-syncope s/p cocaine and opioid ingestion. 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 S/p R frontal ventriculostomy, burr holes 9/22. ETT 9/22-9/23. PMHx of cocaine abuse, CHF, CKD, chronic cough, HTN and DM2.    PT Comments  Pt received in supine and agreeable to session. Pt less vocal and impulsive this session. Pt able to perform gait trial with min A for stability and safety due to poor RW management and carryover of cues. Pt reports posterior neck pain and is instructed in neck stretches, ROM, and chin tucks to prevent stiffness. Pt continues to benefit from PT services to progress toward functional mobility goals.    If plan is discharge home, recommend the following: Supervision due to cognitive status;Assist for transportation;Help with stairs or ramp for entrance;Assistance with cooking/housework;Direct supervision/assist for medications management;Direct supervision/assist for financial management;A lot of help with walking and/or transfers;A lot of help with bathing/dressing/bathroom   Can travel by private vehicle     No  Equipment Recommendations  Rolling walker (2 wheels);BSC/3in1    Recommendations for Other Services       Precautions / Restrictions Precautions Precautions: Fall;Other (comment) Recall of Precautions/Restrictions: Impaired Precaution/Restrictions Comments: PEG, SBP < 160, impulsive Restrictions Weight Bearing Restrictions Per Provider Order: No     Mobility  Bed Mobility Overal bed mobility: Needs Assistance Bed Mobility: Supine to Sit, Sit to Supine     Supine to sit: Min assist Sit to supine: Contact guard assist   General bed mobility comments:  Pt requesting HHA for trunk elevation.    Transfers Overall transfer level: Needs assistance Equipment used: Rolling walker (2 wheels) Transfers: Sit to/from Stand Sit to Stand: Contact guard assist           General transfer comment: STS from EOB x2 with cues for hand placement and CGA for safety    Ambulation/Gait Ambulation/Gait assistance: Min assist Gait Distance (Feet): 115 Feet Assistive device: Rolling walker (2 wheels) Gait Pattern/deviations: Step-through pattern, Trunk flexed, Decreased stride length, Shuffle Gait velocity: reduced     General Gait Details: Pt demonstrates short, shuffled steps with low foot clearance and intermittently sliding feet forward. Pt able to improve briefly with frequent cues, but unable to maintain. Pt tends to push RW far out in front despite frequent cues and assist.   Stairs             Wheelchair Mobility     Tilt Bed    Modified Rankin (Stroke Patients Only) Modified Rankin (Stroke Patients Only) Pre-Morbid Rankin Score: No symptoms Modified Rankin: Moderately severe disability     Balance Overall balance assessment: Needs assistance Sitting-balance support: No upper extremity supported, Feet supported Sitting balance-Leahy Scale: Fair Sitting balance - Comments: CGA sitting EOB. Posterior lean requiring assist to correct intermittently   Standing balance support: Reliant on assistive device for balance, Bilateral upper extremity supported, During functional activity Standing balance-Leahy Scale: Poor Standing balance comment: reliant on RW and external support for stability                            Communication Communication Communication: No apparent difficulties Factors Affecting Communication: Reduced clarity of speech  Cognition Arousal: Alert Behavior During Therapy: Flat affect, Impulsive   PT -  Cognitive impairments: Awareness, Memory, Attention, Problem solving, Safety/Judgement                        PT - Cognition Comments: Pt appears more flat and quiet this session. Poor carryover of cues requiring frequent multimodal cues for safety and technique Following commands: Impaired Following commands impaired: Follows multi-step commands inconsistently, Follows one step commands with increased time    Cueing Cueing Techniques: Verbal cues, Visual cues, Gestural cues  Exercises Other Exercises Other Exercises: cervical ROM and stretch x3 Other Exercises: chin tucks x5    General Comments        Pertinent Vitals/Pain Pain Assessment Pain Assessment: Faces Faces Pain Scale: Hurts little more Pain Location: posterior neck Pain Descriptors / Indicators: Aching Pain Intervention(s): Monitored during session, Heat applied, Repositioned     PT Goals (current goals can now be found in the care plan section) Acute Rehab PT Goals Patient Stated Goal: to go home PT Goal Formulation: With patient Time For Goal Achievement: 02/24/24 Progress towards PT goals: Progressing toward goals    Frequency    Min 2X/week       AM-PAC PT 6 Clicks Mobility   Outcome Measure  Help needed turning from your back to your side while in a flat bed without using bedrails?: A Little Help needed moving from lying on your back to sitting on the side of a flat bed without using bedrails?: A Little Help needed moving to and from a bed to a chair (including a wheelchair)?: A Little Help needed standing up from a chair using your arms (e.g., wheelchair or bedside chair)?: A Little Help needed to walk in hospital room?: A Little Help needed climbing 3-5 steps with a railing? : A Lot 6 Click Score: 17    End of Session Equipment Utilized During Treatment: Gait belt Activity Tolerance: Patient tolerated treatment well Patient left: in bed;with call bell/phone within reach;with bed alarm set Nurse Communication: Mobility status PT Visit Diagnosis: Unsteadiness on feet  (R26.81);Difficulty in walking, not elsewhere classified (R26.2);Other abnormalities of gait and mobility (R26.89);Other symptoms and signs involving the nervous system (R29.898);Muscle weakness (generalized) (M62.81)     Time: 8684-8657 PT Time Calculation (min) (ACUTE ONLY): 27 min  Charges:    $Gait Training: 8-22 mins $Therapeutic Activity: 8-22 mins PT General Charges $$ ACUTE PT VISIT: 1 Visit                    Darryle George, PTA Acute Rehabilitation Services Secure Chat Preferred  [phone]    Darryle George 02/23/2024, 2:00 PM

## 2024-02-24 DIAGNOSIS — I610 Nontraumatic intracerebral hemorrhage in hemisphere, subcortical: Secondary | ICD-10-CM | POA: Diagnosis not present

## 2024-02-24 MED ORDER — STERILE WATER FOR INJECTION IJ SOLN
INTRAMUSCULAR | Status: AC
  Filled 2024-02-24: qty 10

## 2024-02-24 MED ORDER — OLANZAPINE 10 MG IM SOLR
5.0000 mg | Freq: Three times a day (TID) | INTRAMUSCULAR | Status: DC | PRN
  Administered 2024-02-24 – 2024-03-14 (×5): 5 mg via INTRAMUSCULAR
  Filled 2024-02-24 (×10): qty 10

## 2024-02-24 MED ORDER — HYDRALAZINE HCL 25 MG PO TABS
25.0000 mg | ORAL_TABLET | Freq: Four times a day (QID) | ORAL | Status: DC | PRN
  Administered 2024-02-24: 25 mg via ORAL
  Filled 2024-02-24: qty 1

## 2024-02-24 NOTE — Plan of Care (Signed)
 She became full of anxiety and would not allow an IV to be replaced. Order for IM Zyprexa  given. Calm and laying in bed.    Problem: Education: Goal: Ability to describe self-care measures that may prevent or decrease complications (Diabetes Survival Skills Education) will improve Outcome: Progressing   Problem: Nutritional: Goal: Maintenance of adequate nutrition will improve Outcome: Progressing   Problem: Safety: Goal: Ability to remain free from injury will improve Outcome: Progressing   Problem: Skin Integrity: Goal: Risk for impaired skin integrity will decrease Outcome: Progressing

## 2024-02-24 NOTE — Progress Notes (Signed)
 PROGRESS NOTE    Amanda [name]  FMW:[id number] DOB: 13-Aug-1960 DOA: 01/03/2024 PCP: Patient, No Pcp Per   Brief Narrative:  Patient is a 63 year old female with history of hypertension, cocaine abuse, diabetes type 2 who presented with lethargy, near syncope.  Found to be severely hypertensive, UDS positive for cocaine, with acute intraparenchymal hemorrhage , intraventricular hemorrhage and hydrocephalus.  Patient was admitted to ICU for close blood pressure management.  Neurosurgery consulted.  Status post right frontal ventriculostomy. EVD eventually removed on 10/3. Hospital course remarkable for dysphagia.  Speech therapy following.  Dietitian/speech therapy recommending PEG placement because she was not able to tolerate anything by mouth.  PT/OT recommending SNF on discharge.  Underwent PEG placement by IR on 10/16.  Tube feeds stopped few days ago.  TOC following    Significant events: 9/22 Admitted and intubated, emergent burr holes and right frontal ventriculostomy placed by neurosurgery due to neurological worsening 9/23 extubated 10/1 EVD clamped 10/3 EVD removed 10/16 PEG placement by IR for worsening dysphagia  Assessment & Plan:   Principal Problem:   ICH (intracerebral hemorrhage) (HCC) Active Problems:   Hypertensive emergency   Tobacco abuse   Cocaine abuse (HCC)   History of alcohol abuse   Chronic combined systolic and diastolic heart failure (HCC)   HTN (hypertension)   Hemorrhagic stroke (HCC)   Malnutrition of moderate degree  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 Continues to receive PT. Plan for discharge to SNF.  EVD removed 01/14/2024, scalp staples removed 02/01/2024. Still pending bed offer.   Hypertension: Currently on carvedilol  25 mg twice daily, Entresto .  Patient's systolic blood pressure has been mostly under 120 and diastolic under 80.   Amlodipine  discontinued due to history of systolic CHF and blood pressure now optimal.   Dysphagia: S/P PEG placement on 10/16. Now has progressed to dysphagia 3 diet and she has been eating enough and meeting all the nutritional demands, dietitian on board, tube feeds stopped 02/18/2024.  She remains on protein supplements.  Will need PEG tube removed around 03/19/2024 if continues to consume enough diet to meet nutritional demands.   Chronic combined systolic/diastolic CHF: Stable, continue Entresto  and spironolactone .  Not on aspirin  due to recent hemorrhage.    Type 2 diabetes: Recent A1c of 6.6. Glucose controlled on sliding scale insulin .   Cocaine abuse: Consulted TOC for cocaine cessation resources.  Delirium/fall: On Seroquel  and as needed soft restraints.  However she has not required any soft restraints since 02/13/2024 per nursing reports. 1. Avoid benzodiazepines, antihistamines, anticholinergics, and minimize opiate use as these may worsen delirium. 2: Assess, prevent and manage pain as lack of treatment can result in delirium.  3: Provide appropriate lighting and clear signage; a clock and calendar should be easily visible to the patient. 4: Monitor environmental factors. Reduce light and noise at night (close shades, turn off lights, turn off TV, ect). Correct any alterations in sleep cycle. 5: Reorient the patient to person, place, time and situation on each encounter.  6: Correct sensory deficits if possible (replace eye glasses, hearing aids, ect). 7: Avoid restraints if able. Severely delirious patients benefit from constant observation by a sitter.  DVT prophylaxis: enoxaparin  (LOVENOX ) injection 40 mg Start: 02/21/24 1430 Place and maintain sequential compression device Start: 01/03/24 0611 SCDs Start: 01/03/24 0429   Code Status: Full Code  Family Communication:  None present at bedside.   Status is: Inpatient Remains inpatient appropriate  because: Medically stable,  pending placement.   Estimated body mass index is 19.77 kg/m as calculated from the following:   Height as of this encounter: 5' 5 (1.651 m).   Weight as of this encounter: 53.9 kg.    Nutritional Assessment: Body mass index is 19.77 kg/m.Amanda [name] [name].  I agree with the assessment and plan as outlined below: Nutrition Status: Nutrition Problem: Moderate Malnutrition Etiology: social / environmental circumstances (polysubstance abuse) Signs/Symptoms: mild muscle depletion, moderate fat depletion, mild fat depletion, moderate muscle depletion Interventions: Ensure Enlive (each supplement provides 350kcal and 20 grams of protein), MVI  . Skin Assessment: I have examined the patient's skin and I agree with the wound assessment as performed by the wound care RN as outlined below:    Consultants:  Neurosurgery and IR  Procedures:  As above  Antimicrobials:  Anti-infectives (From admission, onward)    Start     Dose/Rate Route Frequency Ordered Stop   01/27/24 0807  ceFAZolin (ANCEF) IVPB 2g/100 mL premix        over 30 Minutes Intravenous Continuous PRN 01/27/24 0807 01/27/24 0807   01/13/24 0600  ceFAZolin (ANCEF) IVPB 2g/100 mL premix        2 g 200 mL/hr over 30 Minutes Intravenous On call to O.R. 01/11/24 2300 01/13/24 0557         Subjective: Seen and examined.  Alert and oriented.  No complaints.  RN at the bedside.  Objective: Vitals:   02/23/24 2345 02/24/24 0406 02/24/24 0409 02/24/24 0736  BP: 113/83 (!) 123/95  (!) 159/87  Pulse: 79 77  78  Resp: 16 16  18   Temp: 98 F (36.7 C) 98.2 F (36.8 C)  98.6 F (37 C)  TempSrc: Axillary Axillary  Axillary  SpO2: 96% 98%  100%  Weight:   53.9 kg   Height:        Intake/Output Summary (Last 24 hours) at 02/24/2024 0748 Last data filed at 02/23/2024 0958 Gross per 24 hour  Intake 50 ml  Output --  Net 50 ml   Filed Weights   02/22/24 0441 02/23/24 0607 02/24/24 0409  Weight: 54.8 kg 52.7 kg  53.9 kg    Examination:  General exam: Appears calm and comfortable  Respiratory system: Clear to auscultation. Respiratory effort normal. Cardiovascular system: S1 & S2 heard, RRR. No JVD, murmurs, rubs, gallops or clicks. No pedal edema. Gastrointestinal system: Abdomen is nondistended, soft and nontender. No organomegaly or masses felt. Normal bowel sounds heard. Central nervous system: Alert and oriented x 2.  Left hemiparesis. Extremities: Symmetric 5 x 5 power.   Data Reviewed: I have personally reviewed following labs and imaging studies  CBC: No results for input(s): WBC, NEUTROABS, HGB, HCT, MCV, PLT in the last 168 hours.  Basic Metabolic Panel: No results for input(s): NA, K, CL, CO2, GLUCOSE, BUN, CREATININE, CALCIUM, MG, PHOS in the last 168 hours.  GFR: CrCl cannot be calculated (Patient's most recent lab result is older than the maximum 21 days allowed.). Liver Function Tests: No results for input(s): AST, ALT, ALKPHOS, BILITOT, PROT, ALBUMIN in the last 168 hours. No results for input(s): LIPASE, AMYLASE in the last 168 hours. No results for input(s): AMMONIA in the last 168 hours. Coagulation Profile: No results for input(s): INR, PROTIME in the last 168 hours.  Cardiac Enzymes: No results for input(s): CKTOTAL, CKMB, CKMBINDEX, TROPONINI in the last 168 hours. BNP (last 3 results) No results for input(s): PROBNP in the last 8760  hours. HbA1C: No results for input(s): HGBA1C in the last 72 hours. CBG: Recent Labs  Lab 02/22/24 2032 02/23/24 0016 02/23/24 0751 02/23/24 1116 02/23/24 1606  GLUCAP 115* 136* 109* 137* 123*   Lipid Profile: No results for input(s): CHOL, HDL, LDLCALC, TRIG, CHOLHDL, LDLDIRECT in the last 72 hours. Thyroid Function Tests: No results for input(s): TSH, T4TOTAL, FREET4, T3FREE, THYROIDAB in the last 72 hours. Anemia Panel: No results for  input(s): VITAMINB12, FOLATE, FERRITIN, TIBC, IRON, RETICCTPCT in the last 72 hours. Sepsis Labs: No results for input(s): PROCALCITON, LATICACIDVEN in the last 168 hours.  No results found for this or any previous visit (from the past 240 hours).   Radiology Studies: No results found.  Scheduled Meds:  carvedilol   25 mg Oral BID WC   enoxaparin  (LOVENOX ) injection  40 mg Subcutaneous Daily   feeding supplement  237 mL Oral TID BM   free water   50 mL Per Tube Daily   mineral oil-hydrophilic petrolatum    Topical BID   mouth rinse  15 mL Mouth Rinse 4 times per day   QUEtiapine   25 mg Oral QHS   sacubitril -valsartan   1 tablet Oral BID   spironolactone   25 mg Oral Daily   Continuous Infusions:     LOS: 52 days   Fredia Skeeter, MD [facility]  02/24/2024, 7:48 AM   *Please note that this is a verbal dictation therefore any spelling or grammatical errors are due to the Dragon Medical One system interpretation.  Please page via Amion and do not message via secure chat for urgent patient care matters. Secure chat can be used for non urgent patient care matters.  How to contact the [facility] Attending or Consulting provider 7A - 7P or covering provider during after hours 7P -7A, for this patient?  Check the care team in [facility] and look for a) attending/consulting [facility] provider listed and b) the [facility] team listed. Page or secure chat 7A-7P. Log into [email or web address] and use [facility] universal password to access. If you do not have the password, please contact the hospital operator. Locate the [facility] provider you are looking for under [facility] and page to a number that you can be directly reached. If you still have difficulty reaching the provider, please page the [facility] (Director on Call) for the Hospitalists listed on amion for assistance.

## 2024-02-24 NOTE — Plan of Care (Signed)
  Problem: Nutritional: Goal: Maintenance of adequate nutrition will improve Outcome: Progressing Goal: Progress toward achieving an optimal weight will improve Outcome: Progressing   Problem: Skin Integrity: Goal: Risk for impaired skin integrity will decrease Outcome: Progressing   Problem: Clinical Measurements: Goal: Ability to maintain clinical measurements within normal limits will improve Outcome: Progressing Goal: Will remain free from infection Outcome: Progressing Goal: Diagnostic test results will improve Outcome: Progressing Goal: Respiratory complications will improve Outcome: Progressing Goal: Cardiovascular complication will be avoided Outcome: Progressing   Problem: Activity: Goal: Risk for activity intolerance will decrease Outcome: Progressing   Problem: Elimination: Goal: Will not experience complications related to bowel motility Outcome: Progressing Goal: Will not experience complications related to urinary retention Outcome: Progressing   Problem: Pain Managment: Goal: General experience of comfort will improve and/or be controlled Outcome: Progressing   Problem: Skin Integrity: Goal: Risk for impaired skin integrity will decrease Outcome: Progressing

## 2024-02-25 DIAGNOSIS — I5042 Chronic combined systolic (congestive) and diastolic (congestive) heart failure: Secondary | ICD-10-CM | POA: Diagnosis not present

## 2024-02-25 DIAGNOSIS — Z720 Tobacco use: Secondary | ICD-10-CM

## 2024-02-25 DIAGNOSIS — F1410 Cocaine abuse, uncomplicated: Secondary | ICD-10-CM | POA: Diagnosis not present

## 2024-02-25 DIAGNOSIS — I610 Nontraumatic intracerebral hemorrhage in hemisphere, subcortical: Secondary | ICD-10-CM | POA: Diagnosis not present

## 2024-02-25 MED ORDER — STERILE WATER FOR INJECTION IJ SOLN
INTRAMUSCULAR | Status: AC
  Filled 2024-02-25: qty 10

## 2024-02-25 MED ORDER — STERILE WATER FOR INJECTION IJ SOLN
INTRAMUSCULAR | Status: AC
  Administered 2024-02-25: 10 mL
  Filled 2024-02-25: qty 10

## 2024-02-25 NOTE — TOC Progression Note (Signed)
 Transition of Care [facility]) - Progression Note    Patient Details  Name: Amanda [name] MRN: [id number] Date of Birth: 04-12-1961  Transition of Care [facility] CM/SW Contact  Andrez JULIANNA George, RN Phone Number: 02/25/2024, 4:35 PM  Clinical Narrative:     IP Care management continues to work on placement.  Expected Discharge Plan: Skilled Nursing Facility Barriers to Discharge: English As A Second Language Teacher, Continued Medical Work up, Inadequate or no insurance, Facility will not accept until restraint criteria met, SNF Pending bed offer               Expected Discharge Plan and Services In-house Referral: Clinical Social Work   Post Acute Care Choice: Skilled Nursing Facility Living arrangements for the past 2 months: Single Family Home                                       Social Drivers of Health (SDOH) Interventions SDOH Screenings   Food Insecurity: No Food Insecurity (07/16/2023)   Received from [facility]  Housing: Low Risk  (07/16/2023)   Received from [facility]  Transportation Needs: No Transportation Needs (07/16/2023)   Received from [facility]  Utilities: Not At Risk (07/16/2023)   Received from [facility]  Alcohol Screen: Low Risk  (05/14/2023)  Financial Resource Strain: Low Risk  (07/16/2023)   Received from [facility]  Tobacco Use: High Risk (01/26/2024)    Readmission Risk Interventions    03/26/2022   10:50 AM  Readmission Risk Prevention Plan  Transportation Screening Complete  HRI or Home Care Consult Complete  Social Work Consult for Recovery Care Planning/Counseling Complete  Palliative Care Screening Not Applicable  Medication Review Oceanographer) Referral to Pharmacy

## 2024-02-25 NOTE — Plan of Care (Signed)
  Problem: Nutritional: Goal: Maintenance of adequate nutrition will improve Outcome: Progressing Goal: Progress toward achieving an optimal weight will improve Outcome: Progressing   Problem: Skin Integrity: Goal: Risk for impaired skin integrity will decrease Outcome: Progressing   Problem: Clinical Measurements: Goal: Ability to maintain clinical measurements within normal limits will improve Outcome: Progressing Goal: Will remain free from infection Outcome: Progressing Goal: Diagnostic test results will improve Outcome: Progressing Goal: Respiratory complications will improve Outcome: Progressing Goal: Cardiovascular complication will be avoided Outcome: Progressing   Problem: Nutrition: Goal: Adequate nutrition will be maintained Outcome: Progressing   Problem: Activity: Goal: Risk for activity intolerance will decrease Outcome: Progressing   Problem: Elimination: Goal: Will not experience complications related to bowel motility Outcome: Progressing Goal: Will not experience complications related to urinary retention Outcome: Progressing   Problem: Pain Managment: Goal: General experience of comfort will improve and/or be controlled Outcome: Progressing   Problem: Safety: Goal: Ability to remain free from injury will improve Outcome: Progressing

## 2024-02-25 NOTE — Progress Notes (Signed)
 Progress Note   Patient: Amanda [name] FMW:[id number] DOB: 11-09-1960 DOA: 01/03/2024     53 DOS: the patient was seen and examined on 02/25/2024   Brief hospital course: Amanda [name] is 63 year old female with history of hypertension, cocaine abuse, diabetes type 2 who presented with lethargy, near syncope.  Found to be severely hypertensive, UDS positive for cocaine, with acute intraparenchymal hemorrhage , intraventricular hemorrhage and hydrocephalus.  Patient was admitted to ICU for close blood pressure management.  Neurosurgery consulted.  Status post right frontal ventriculostomy. EVD eventually removed on 10/3. Hospital course remarkable for dysphagia. Dietitian/speech therapy recommending PEG placement. PT/OT recommending SNF on discharge.  S/o PEG placement by IR on 10/16.  Tube feeds stopped 02/18/24, currently on supplements, plan to remove PEG 03/19/24 if oral intake better.  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 Continues to receive PT. Plan for discharge to SNF.  EVD removed 01/14/2024, scalp staples removed 02/01/2024.  Still pending bed offer per TOC   Hypertension:  Continue carvedilol  25 mg twice daily, Entresto .   Amlodipine  discontinued due to history of systolic CHF and blood pressure now optimal.   Dysphagia:  Moderate malnutrition- S/P PEG placement on 10/16. Currently on dysphagia 3 diet and eating better, meeting all the nutritional demands, dietitian on board, tube feeds stopped 02/18/2024.  Continue protein supplements.  Plan to remove PEG tube around 03/19/2024 if continues to meet her nutritional demands.   Chronic combined systolic/diastolic CHF:  continue Entresto  and spironolactone .  Not on aspirin  due to recent hemorrhage.    Type 2 diabetes: Recent A1c of 6.6. Glucose controlled on sliding scale insulin .    Cocaine abuse: Consulted TOC for cocaine cessation resources.  S/p fall- Continue delirium precautions. Fall precautions.     Out of bed to chair. Incentive spirometry. Nursing supportive care. Fall, aspiration precautions. Diet:  Diet Orders (From admission, onward)     Start     Ordered   02/13/24 1819  DIET DYS 3 Room service appropriate? Yes; Fluid consistency: Thin  Diet effective now       Question Answer Comment  Room service appropriate? Yes   Fluid consistency: Thin      02/13/24 1820           DVT prophylaxis: enoxaparin  (LOVENOX ) injection 40 mg Start: 02/21/24 1430 Place and maintain sequential compression device Start: 01/03/24 0611 SCDs Start: 01/03/24 0429  Level of care: Progressive   Code Status: Full Code  Subjective: Patient is seen and examined today morning. She was lying comfortably. RN notified that she had fall this afternoon, no head injury. She is being moved to nurse station.  Physical Exam: Vitals:   02/25/24 0621 02/25/24 0847 02/25/24 1111 02/25/24 1411  BP: (!) 156/98 (!) 151/105 (!) 118/90 (!) 136/91  Pulse: 70 95 85 (!) 109  Resp:  20 17 16   Temp:  98.4 F (36.9 C) (!) 97.4 F (36.3 C) 98.4 F (36.9 C)  TempSrc:  Oral Oral   SpO2:  100% 99% 97%  Weight:      Height:        General - Elderly African American female, no apparent distress HEENT - PERRLA, EOMI, atraumatic head, non tender sinuses. Lung - Clear, no rales, rhonchi, wheezes. Heart - S1, S2 heard, no murmurs, rubs, no pedal edema. Abdomen - Soft, non tender, bowel sounds good, PEG intact Neuro - Alert, awake and  oriented, non focal exam. Skin - Warm and dry.  Data Reviewed:      Latest Ref Rng & Units 01/29/2024    6:33 AM 01/24/2024    1:37 AM 01/15/2024    6:23 AM  CBC  WBC 4.0 - 10.5 K/uL 7.2  7.4  8.9   Hemoglobin 12.0 - 15.0 g/dL 86.6  87.2  86.8   Hematocrit 36.0 - 46.0 % 40.1  39.4  39.7   Platelets 150 - 400 K/uL 284  444  408       Latest  Ref Rng & Units 01/29/2024    6:33 AM 01/24/2024    1:37 AM 01/21/2024    6:03 AM  BMP  Glucose 70 - 99 mg/dL 826  862  847   BUN 8 - 23 mg/dL 36  29  33   Creatinine 0.44 - 1.00 mg/dL 9.13  9.28  9.16   Sodium 135 - 145 mmol/L 135  141  139   Potassium 3.5 - 5.1 mmol/L 4.0  4.2  4.2   Chloride 98 - 111 mmol/L 99  106  104   CO2 22 - 32 mmol/L 24  24  20    Calcium 8.9 - 10.3 mg/dL 9.9  9.2  9.3    No results found.  Family Communication: Discussed with patient, understand and agree. All questions answered.  Disposition: Status is: Inpatient Remains inpatient appropriate because: pending safe discharge plan  Planned Discharge Destination: Skilled nursing facility     Time spent: 44 minutes  Author: Concepcion Riser, MD 02/25/2024 2:28 PM Secure chat 7am to 7pm For on call review [email or web address].

## 2024-02-25 NOTE — Progress Notes (Signed)
   02/25/24 1411  What Happened  Was fall witnessed? No  Patients activity before fall other (comment) (resting on bed)  Point of contact buttocks (pt stated)  Was patient injured? No  Patient found on floor  Found by Staff-comment Training And Development Officer, Tiffany)  Stated prior activity other (comment) (watching TV)  Provider Notification  Provider Name/Title Darci Pore  Date Provider Notified 02/25/24  Time Provider Notified 1427  Method of Notification Page  Notification Reason Fall  Provider response No new orders  Date of Provider Response 02/25/24  Time of Provider Response 1427  Follow Up  Family notified Yes - comment Estill Benders)  Time family notified 1530  Additional tests No  Progress note created (see row info) Yes  Adult Fall Risk Assessment  Risk Factor Category (scoring not indicated) High fall risk per protocol (document High fall risk)  Patient Fall Risk Level High fall risk  Adult Fall Risk Interventions  Required Bundle Interventions *See Row Information* High fall risk  Additional Interventions Reorient/diversional activities with confused patients;Room near nurses station  Screening for Fall Injury Risk (To be completed on HIGH fall risk patients) - Assessing Need for Floor Mats  Risk For Fall Injury- Criteria for Floor Mats Confusion/dementia (+NuDESC, CIWA, TBI, etc.);Admitted as a result of a fall;Noncompliant with safety precautions;Previous fall this admission  Will Implement Floor Mats Yes  Vitals  Temp 98.4 F (36.9 C)  Temp Source Oral  BP (!) 136/91  MAP (mmHg) 102  BP Location Right Arm  BP Method Automatic  Patient Position (if appropriate) Lying  Pulse Rate (!) 109  Pulse Rate Source Monitor  Resp 16  Oxygen Therapy  SpO2 97 %  O2 Device Room Air  Pain Assessment  Pain Scale 0-10  Pain Score 0  Neurological  Level of Consciousness Alert  Glasgow Coma Scale  Eye Opening 4  Best Verbal Response (NON-intubated) 4  Best Motor  Response 6  Glasgow Coma Scale Score 14  NIH Stroke Scale   Dizziness Present No  Headache Present No  Interval Other (Comment)  Level of Consciousness (1a.)    0  LOC Questions (1b. )    1  LOC Commands (1c. )    0  Best Gaze (2. )   0  Visual (3. )   0  Facial Palsy (4. )     0  Motor Arm, Left (5a. )    0  Motor Arm, Right (5b. )  0  Motor Leg, Left (6a. )   1  Motor Leg, Right (6b. )  1  Limb Ataxia (7. ) 0  Sensory (8. )   0  Best Language (9. )   0  Dysarthria (10. ) 1  Extinction/Inattention (11.)    0  Complete NIHSS TOTAL 4

## 2024-02-25 NOTE — Progress Notes (Signed)
 OT Cancellation Note  Patient Details Name: Amanda [name] MRN: [id number] DOB: Jul 18, 1960   Cancelled Treatment:    Reason Eval/Treat Not Completed:  (Pt receiving nursing care, will continue efforts.)   Winnifred Dufford M. Bur[place], OTR/L [facility] [phone] Secure Chat Preferred  Lovelace Cerveny 02/25/2024, 4:12 PM

## 2024-02-25 NOTE — Progress Notes (Signed)
 Physical Therapy Treatment Patient Details Name: Amanda [name] MRN: [id number] DOB: 04-10-1961 Today's Date: 02/25/2024   History of Present Illness 63 yo female presents to [facility] on 9/22 with lethargy and near-syncope s/p cocaine and opioid ingestion. 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 S/p R frontal ventriculostomy, burr holes 9/22. ETT 9/22-9/23. PMHx of cocaine abuse, CHF, CKD, chronic cough, HTN and DM2.    PT Comments  Pt received in supine and appears more confused and distracted this session. Pt requires increased cues and assist to complete mobility tasks due to impaired attention and awareness. Pt noted to be soiled in urine upon entry and requires assist with gown change and hygiene tasks. Pt demonstrates slightly improved upright posture at beginning of gait trial, but demonstrates increased trunk flexion and proximity from RW with increased distance despite max cues.  Pt continues to benefit from PT services to progress toward functional mobility goals.    If plan is discharge home, recommend the following: Supervision due to cognitive status;Assist for transportation;Help with stairs or ramp for entrance;Assistance with cooking/housework;Direct supervision/assist for medications management;Direct supervision/assist for financial management;A lot of help with walking and/or transfers;A lot of help with bathing/dressing/bathroom   Can travel by private vehicle     No  Equipment Recommendations  Rolling walker (2 wheels);BSC/3in1    Recommendations for Other Services       Precautions / Restrictions Precautions Precautions: Fall;Other (comment) Recall of Precautions/Restrictions: Impaired Precaution/Restrictions Comments: PEG, SBP < 160, impulsive Restrictions Weight Bearing Restrictions Per Provider Order: No     Mobility  Bed Mobility Overal bed mobility: Needs  Assistance Bed Mobility: Supine to Sit, Sit to Supine     Supine to sit: Min assist, HOB elevated Sit to supine: Min assist, HOB elevated   General bed mobility comments: Pt requesting HHA for trunk elevation. Assist to guide trunk back to supine    Transfers Overall transfer level: Needs assistance Equipment used: Rolling walker (2 wheels) Transfers: Sit to/from Stand Sit to Stand: Min assist           General transfer comment: STS from EOB with min A for stability and anterior weight shift. Cues for hand placement, but pt continuing to pull on RW    Ambulation/Gait Ambulation/Gait assistance: Min assist Gait Distance (Feet): 115 Feet Assistive device: Rolling walker (2 wheels) Gait Pattern/deviations: Step-through pattern, Trunk flexed, Decreased stride length, Shuffle Gait velocity: reduced     General Gait Details: Pt demonstrates short, shuffled steps with low foot clearance and intermittently sliding feet forward. Pt able to improve briefly with frequent cues, but unable to maintain. Pt tends to push RW far out in front despite frequent cues and assist. Assist for balance and safety due to anterior lean and poor RW management   Stairs             Wheelchair Mobility     Tilt Bed    Modified Rankin (Stroke Patients Only) Modified Rankin (Stroke Patients Only) Pre-Morbid Rankin Score: No symptoms Modified Rankin: Moderately severe disability     Balance Overall balance assessment: Needs assistance Sitting-balance support: No upper extremity supported, Feet supported, Single extremity supported Sitting balance-Leahy Scale: Poor Sitting balance - Comments: Posterior lean requiring min-mod A to maintain   Standing balance support: Reliant on assistive device for balance, Bilateral upper extremity supported, During functional activity Standing balance-Leahy Scale: Poor Standing balance comment: reliant on RW and external support for stability  Communication Communication Communication: No apparent difficulties Factors Affecting Communication: Reduced clarity of speech  Cognition Arousal: Alert Behavior During Therapy: Flat affect, Impulsive   PT - Cognitive impairments: Awareness, Memory, Attention, Problem solving, Safety/Judgement                       PT - Cognition Comments: Decreased awareness and perseveration on calling friend with difficulty redirecting Following commands: Impaired Following commands impaired: Follows multi-step commands inconsistently, Follows one step commands with increased time, Follows one step commands inconsistently    Cueing Cueing Techniques: Verbal cues, Visual cues, Gestural cues  Exercises      General Comments        Pertinent Vitals/Pain Pain Assessment Pain Assessment: Faces Faces Pain Scale: Hurts a little bit Pain Location: L neck Pain Descriptors / Indicators: Aching Pain Intervention(s): Limited activity within patient's tolerance, Monitored during session, Repositioned     PT Goals (current goals can now be found in the care plan section) Acute Rehab PT Goals Patient Stated Goal: to go home PT Goal Formulation: With patient Time For Goal Achievement: 02/24/24 Progress towards PT goals: Progressing toward goals    Frequency    Min 2X/week       AM-PAC PT 6 Clicks Mobility   Outcome Measure  Help needed turning from your back to your side while in a flat bed without using bedrails?: A Little Help needed moving from lying on your back to sitting on the side of a flat bed without using bedrails?: A Little Help needed moving to and from a bed to a chair (including a wheelchair)?: A Little Help needed standing up from a chair using your arms (e.g., wheelchair or bedside chair)?: A Little Help needed to walk in hospital room?: A Little Help needed climbing 3-5 steps with a railing? : A Lot 6 Click Score: 17    End of  Session Equipment Utilized During Treatment: Gait belt Activity Tolerance: Patient tolerated treatment well Patient left: in bed;with call bell/phone within reach;with bed alarm set Nurse Communication: Mobility status PT Visit Diagnosis: Unsteadiness on feet (R26.81);Difficulty in walking, not elsewhere classified (R26.2);Other abnormalities of gait and mobility (R26.89);Other symptoms and signs involving the nervous system (R29.898);Muscle weakness (generalized) (M62.81)     Time: 8748-8673 PT Time Calculation (min) (ACUTE ONLY): 35 min  Charges:    $Gait Training: 8-22 mins $Therapeutic Activity: 8-22 mins PT General Charges $$ ACUTE PT VISIT: 1 Visit                     Darryle George, PTA Acute Rehabilitation Services Secure Chat Preferred  [phone]    Darryle George 02/25/2024, 1:38 PM

## 2024-02-25 NOTE — Progress Notes (Signed)
 Transferred into room 3W 28 to bring her closer to the nurses station.  Dr and daughter made aware.
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
## 2024-02-26 DIAGNOSIS — I5042 Chronic combined systolic (congestive) and diastolic (congestive) heart failure: Secondary | ICD-10-CM | POA: Diagnosis not present

## 2024-02-26 DIAGNOSIS — I610 Nontraumatic intracerebral hemorrhage in hemisphere, subcortical: Secondary | ICD-10-CM | POA: Diagnosis not present

## 2024-02-26 DIAGNOSIS — F1410 Cocaine abuse, uncomplicated: Secondary | ICD-10-CM | POA: Diagnosis not present

## 2024-02-26 DIAGNOSIS — Z720 Tobacco use: Secondary | ICD-10-CM | POA: Diagnosis not present

## 2024-02-26 MED ORDER — STERILE WATER FOR INJECTION IJ SOLN
INTRAMUSCULAR | Status: AC
  Filled 2024-02-26: qty 10

## 2024-02-26 MED ORDER — ZIPRASIDONE MESYLATE 20 MG IM SOLR
10.0000 mg | Freq: Once | INTRAMUSCULAR | Status: AC
  Administered 2024-02-26: 10 mg via INTRAMUSCULAR
  Filled 2024-02-26: qty 20

## 2024-02-26 MED ORDER — STERILE WATER FOR INJECTION IJ SOLN
INTRAMUSCULAR | Status: AC
  Administered 2024-02-26: 10 mL
  Filled 2024-02-26: qty 10

## 2024-02-26 NOTE — Progress Notes (Signed)
 Pt agitated, restless and attempting to get OOB. Pt admin PRN zyprexa  approx 1700 with no effect. Pt fell earlier today and is not redirectable. Sitter at bedside but pt continues to interfere with care and attempt to get OOB. Order obtained for soft waist belt restraint.

## 2024-02-26 NOTE — Progress Notes (Signed)
 Pt is currently laying down and watching tv.

## 2024-02-26 NOTE — Plan of Care (Signed)
  Problem: Education: Goal: Ability to describe self-care measures that may prevent or decrease complications (Diabetes Survival Skills Education) will improve Outcome: Progressing   Problem: Coping: Goal: Ability to adjust to condition or change in health will improve Outcome: Progressing   Problem: Fluid Volume: Goal: Ability to maintain a balanced intake and output will improve Outcome: Progressing   Problem: Health Behavior/Discharge Planning: Goal: Ability to identify and utilize available resources and services will improve Outcome: Progressing Goal: Ability to manage health-related needs will improve Outcome: Progressing   Problem: Metabolic: Goal: Ability to maintain appropriate glucose levels will improve Outcome: Progressing   Problem: Nutritional: Goal: Maintenance of adequate nutrition will improve Outcome: Progressing Goal: Progress toward achieving an optimal weight will improve Outcome: Progressing   Problem: Skin Integrity: Goal: Risk for impaired skin integrity will decrease Outcome: Progressing   Problem: Tissue Perfusion: Goal: Adequacy of tissue perfusion will improve Outcome: Progressing   Problem: Education: Goal: Knowledge of General Education information will improve Description: Including pain rating scale, medication(s)/side effects and non-pharmacologic comfort measures Outcome: Progressing   Problem: Health Behavior/Discharge Planning: Goal: Ability to manage health-related needs will improve Outcome: Progressing   Problem: Clinical Measurements: Goal: Ability to maintain clinical measurements within normal limits will improve Outcome: Progressing Goal: Will remain free from infection Outcome: Progressing Goal: Diagnostic test results will improve Outcome: Progressing Goal: Respiratory complications will improve Outcome: Progressing Goal: Cardiovascular complication will be avoided Outcome: Progressing   Problem: Activity: Goal:  Risk for activity intolerance will decrease Outcome: Progressing   Problem: Nutrition: Goal: Adequate nutrition will be maintained Outcome: Progressing   Problem: Coping: Goal: Level of anxiety will decrease Outcome: Progressing   Problem: Elimination: Goal: Will not experience complications related to bowel motility Outcome: Progressing Goal: Will not experience complications related to urinary retention Outcome: Progressing   Problem: Pain Managment: Goal: General experience of comfort will improve and/or be controlled Outcome: Progressing   Problem: Safety: Goal: Ability to remain free from injury will improve Outcome: Progressing   Problem: Skin Integrity: Goal: Risk for impaired skin integrity will decrease Outcome: Progressing   Problem: Education: Goal: Knowledge of disease or condition will improve Outcome: Progressing Goal: Knowledge of secondary prevention will improve (MUST DOCUMENT ALL) Outcome: Progressing Goal: Knowledge of patient specific risk factors will improve (DELETE if not current risk factor) Outcome: Progressing   Problem: Intracerebral Hemorrhage Tissue Perfusion: Goal: Complications of Intracerebral Hemorrhage will be minimized Outcome: Progressing   Problem: Coping: Goal: Will verbalize positive feelings about self Outcome: Progressing Goal: Will identify appropriate support needs Outcome: Progressing   Problem: Health Behavior/Discharge Planning: Goal: Ability to manage health-related needs will improve Outcome: Progressing Goal: Goals will be collaboratively established with patient/family Outcome: Progressing   Problem: Self-Care: Goal: Ability to participate in self-care as condition permits will improve Outcome: Progressing Goal: Verbalization of feelings and concerns over difficulty with self-care will improve Outcome: Progressing Goal: Ability to communicate needs accurately will improve Outcome: Progressing   Problem:  Nutrition: Goal: Risk of aspiration will decrease Outcome: Progressing Goal: Dietary intake will improve Outcome: Progressing

## 2024-02-26 NOTE — Progress Notes (Signed)
 Pt currently sleeping

## 2024-02-26 NOTE — Progress Notes (Signed)
 Progress Note   Patient: Amanda [name] FMW:[id number] DOB: Mar 02, 1961 DOA: 01/03/2024     63 DOS: the patient was seen and examined on 02/26/2024   Brief hospital course: Catelynn Sparger is 63 year old female female with history of hypertension, cocaine abuse, diabetes type 2 who presented with lethargy, near syncope.  Found to be severely hypertensive, UDS positive for cocaine, with acute intraparenchymal hemorrhage , intraventricular hemorrhage and hydrocephalus.  Patient was admitted to ICU for close blood pressure management.  Neurosurgery consulted.  Status post right frontal ventriculostomy. EVD eventually removed on 10/3. Hospital course remarkable for dysphagia. Dietitian/speech therapy recommending PEG placement. PT/OT recommending SNF on discharge.  S/o PEG placement by IR on 10/16.  Tube feeds stopped 02/18/24, currently on supplements, plan to remove PEG 03/19/24 if oral intake better. On and off agitated, requiring restraints. TOC following, placement challenging.  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 Continues to receive PT. Plan for discharge to SNF.  EVD removed 01/14/2024, scalp staples removed 02/01/2024.   On and off agitation requiring physical and chemical restraints. Still pending bed offer per TOC. Challenging placement.   Hypertension:  Continue carvedilol  25 mg twice daily, Entresto .   Amlodipine  discontinued due to history of systolic CHF and blood pressure now optimal.   Dysphagia:  Moderate malnutrition- S/P PEG placement on 10/16. Currently on dysphagia 3 diet and eating better, meeting all the nutritional demands, dietitian on board, tube feeds stopped 02/18/2024.  Continue protein supplements.  Plan to remove PEG tube around 03/19/2024 if continues to meet her nutritional demands.   Chronic combined systolic/diastolic CHF:  continue Entresto   and spironolactone .  Not on aspirin  due to recent hemorrhage.    Type 2 diabetes: Recent A1c of 6.6. Glucose controlled on sliding scale insulin .   Cocaine abuse: Consulted TOC for cocaine cessation resources.  S/p fall- Continue delirium precautions. Restraints for safety. Fall precautions.     Out of bed to chair. Incentive spirometry. Nursing supportive care. Fall, aspiration precautions. Diet:  Diet Orders (From admission, onward)     Start     Ordered   02/13/24 1819  DIET DYS 3 Room service appropriate? Yes; Fluid consistency: Thin  Diet effective now       Question Answer Comment  Room service appropriate? Yes   Fluid consistency: Thin      02/13/24 1820           DVT prophylaxis: enoxaparin  (LOVENOX ) injection 40 mg Start: 02/21/24 1430 Place and maintain sequential compression device Start: 01/03/24 0611 SCDs Start: 01/03/24 0429  Level of care: Progressive   Code Status: Full Code  Subjective: Patient is seen and examined today morning. She was lying with waist restraint. Agitated and restless last night, got geodon. Care taker at bedside feeding her.  Physical Exam: Vitals:   02/26/24 0016 02/26/24 0527 02/26/24 1505 02/26/24 1647  BP: (!) 145/97 (!) 127/95 112/78 109/83  Pulse: 92 80 86 78  Resp: 17 16 17 18   Temp: 98.2 F (36.8 C) 98.1 F (36.7 C) 98.6 F (37 C) 98 F (36.7 C)  TempSrc:  Oral Oral   SpO2: 97% 100% 95% 97%  Weight:      Height:        General - Elderly African American female, no apparent distress HEENT - PERRLA, EOMI, atraumatic head, non tender sinuses. Lung - Clear, no rales, rhonchi, wheezes. Heart - S1, S2 heard, no murmurs, rubs, no  pedal edema. Abdomen - Soft, non tender, bowel sounds good, PEG intact Neuro - Alert, awake and calm, restraints over waist. Skin - Warm and dry.  Data Reviewed:      Latest Ref Rng & Units 01/29/2024    6:33 AM 01/24/2024    1:37 AM 01/15/2024    6:23 AM  CBC  WBC 4.0 - 10.5  K/uL 7.2  7.4  8.9   Hemoglobin 12.0 - 15.0 g/dL 86.6  87.2  86.8   Hematocrit 36.0 - 46.0 % 40.1  39.4  39.7   Platelets 150 - 400 K/uL 284  444  408       Latest Ref Rng & Units 01/29/2024    6:33 AM 01/24/2024    1:37 AM 01/21/2024    6:03 AM  BMP  Glucose 70 - 99 mg/dL 826  862  847   BUN 8 - 23 mg/dL 36  29  33   Creatinine 0.44 - 1.00 mg/dL 9.13  9.28  9.16   Sodium 135 - 145 mmol/L 135  141  139   Potassium 3.5 - 5.1 mmol/L 4.0  4.2  4.2   Chloride 98 - 111 mmol/L 99  106  104   CO2 22 - 32 mmol/L 24  24  20    Calcium 8.9 - 10.3 mg/dL 9.9  9.2  9.3    No results found.  Family Communication: no family at bedside.  Disposition: Status is: Inpatient Remains inpatient appropriate because: pending safe discharge plan  Planned Discharge Destination: Skilled nursing facility     Time spent: 46 minutes  Author: Concepcion Riser, MD 02/26/2024 7:04 PM Secure chat 7am to 7pm For on call review [email or web address].

## 2024-02-26 NOTE — Progress Notes (Signed)
 1x dose IM geodon given approx 0400. Pt went to sleep approx 0500 and is now resting comfortably.

## 2024-02-26 NOTE — Progress Notes (Signed)
 Pt is eating lunch independently

## 2024-02-26 NOTE — Progress Notes (Signed)
 Pt refusing meds tonight after several attempts to admin.

## 2024-02-26 NOTE — Progress Notes (Signed)
 Pt ate about 55% of lunch

## 2024-02-26 NOTE — Progress Notes (Signed)
 Pt remains restless and agitated. Pt biting holes into her gown. Unable to redirect. PRN IM zyprexa  given with no effect.

## 2024-02-26 NOTE — Progress Notes (Signed)
 Pt is laying down relaxing watching tv, dozed off about twice.

## 2024-02-27 DIAGNOSIS — I610 Nontraumatic intracerebral hemorrhage in hemisphere, subcortical: Secondary | ICD-10-CM | POA: Diagnosis not present

## 2024-02-27 NOTE — Progress Notes (Signed)
 Progress Note   Patient: Amanda [name] FMW:[id number] DOB: 1961/03/21 DOA: 01/03/2024     56 DOS: the patient was seen and examined on 02/27/2024   Brief hospital course: Amanda [name] is 63 year old female with history of hypertension, cocaine abuse, diabetes type 2 who presented with lethargy, near syncope.  Found to be severely hypertensive, UDS positive for cocaine, with acute intraparenchymal hemorrhage , intraventricular hemorrhage and hydrocephalus.  Patient was admitted to ICU for close blood pressure management.  Neurosurgery consulted.  Status post right frontal ventriculostomy. EVD eventually removed on 10/3. Hospital course remarkable for dysphagia. Dietitian/speech therapy recommending PEG placement. PT/OT recommending SNF on discharge.  S/o PEG placement by IR on 10/16.  Tube feeds stopped 02/18/24, currently on supplements, plan to remove PEG 03/19/24 if oral intake better. On and off agitated, requiring restraints. TOC following, placement challenging.  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 Continues to receive PT. Plan for discharge to SNF.  EVD removed 01/14/2024, scalp staples removed 02/01/2024.   On and off agitation requiring physical and chemical restraints. Still pending bed offer per TOC. Challenging placement.   Hypertension:  Continue carvedilol  25 mg twice daily, Entresto .   Amlodipine  discontinued due to history of systolic CHF and blood pressure now optimal.   Dysphagia:  Moderate malnutrition- S/P PEG placement on 10/16. Currently on dysphagia 3 diet and eating better, meeting all the nutritional demands, dietitian on board, tube feeds stopped 02/18/2024.  Continue protein supplements.  Plan to remove PEG tube around 03/19/2024 if continues to meet her nutritional demands.   Chronic combined systolic/diastolic CHF:  continue Entresto   and spironolactone .  Not on aspirin  due to recent hemorrhage.    Type 2 diabetes: Recent A1c of 6.6. Glucose controlled on sliding scale insulin .   Cocaine abuse: Consulted TOC for cocaine cessation resources.  S/p fall- Continue delirium precautions. Restraints for safety. Fall precautions.     Out of bed to chair. Incentive spirometry. Nursing supportive care. Fall, aspiration precautions. Diet:  Diet Orders (From admission, onward)     Start     Ordered   02/13/24 1819  DIET DYS 3 Room service appropriate? Yes; Fluid consistency: Thin  Diet effective now       Question Answer Comment  Room service appropriate? Yes   Fluid consistency: Thin      02/13/24 1820           DVT prophylaxis: enoxaparin  (LOVENOX ) injection 40 mg Start: 02/21/24 1430 Place and maintain sequential compression device Start: 01/03/24 0611 SCDs Start: 01/03/24 0429  Level of care: Progressive   Code Status: Full Code  Subjective: Patient is seen and examined today morning. She was lying with waist restraint. Agitated and restless last night, got geodon. Care taker at bedside feeding her.  Physical Exam: Vitals:   02/27/24 0032 02/27/24 0400 02/27/24 0822 02/27/24 1227  BP: 107/85 90/69 (!) 91/59 99/66  Pulse: 82 74 75 88  Resp: 17 16 20 18   Temp: 97.9 F (36.6 C) 97.8 F (36.6 C) 97.8 F (36.6 C) 98 F (36.7 C)  TempSrc: Oral Oral Axillary Oral  SpO2: 95% 97% 96% 96%  Weight:      Height:        General - Elderly African American female, no apparent distress HEENT - PERRLA, EOMI, atraumatic head, non tender sinuses. Lung - Clear, no rales, rhonchi, wheezes. Heart - S1, S2 heard, no murmurs, rubs, no pedal  edema. Abdomen - Soft, non tender, bowel sounds good, PEG intact Neuro - Alert, awake and calm, strength symmetric and full in both upper and lower extremities Skin - Warm and dry.  Data Reviewed:      Latest Ref Rng & Units 01/29/2024    6:33 AM 01/24/2024    1:37 AM  01/15/2024    6:23 AM  CBC  WBC 4.0 - 10.5 K/uL 7.2  7.4  8.9   Hemoglobin 12.0 - 15.0 g/dL 86.6  87.2  86.8   Hematocrit 36.0 - 46.0 % 40.1  39.4  39.7   Platelets 150 - 400 K/uL 284  444  408       Latest Ref Rng & Units 01/29/2024    6:33 AM 01/24/2024    1:37 AM 01/21/2024    6:03 AM  BMP  Glucose 70 - 99 mg/dL 826  862  847   BUN 8 - 23 mg/dL 36  29  33   Creatinine 0.44 - 1.00 mg/dL 9.13  9.28  9.16   Sodium 135 - 145 mmol/L 135  141  139   Potassium 3.5 - 5.1 mmol/L 4.0  4.2  4.2   Chloride 98 - 111 mmol/L 99  106  104   CO2 22 - 32 mmol/L 24  24  20    Calcium 8.9 - 10.3 mg/dL 9.9  9.2  9.3    No results found.  Family Communication: no family at bedside.  Disposition: Status is: Inpatient Remains inpatient appropriate because: pending safe discharge plan  Planned Discharge Destination: Skilled nursing facility     Time spent: 46 minutes  Author: Derryl Duval, MD 02/27/2024 1:44 PM Secure chat 7am to 7pm For on call review [email or web address].

## 2024-02-28 DIAGNOSIS — I610 Nontraumatic intracerebral hemorrhage in hemisphere, subcortical: Secondary | ICD-10-CM | POA: Diagnosis not present

## 2024-02-28 NOTE — Plan of Care (Signed)
  Problem: Fluid Volume: Goal: Ability to maintain a balanced intake and output will improve Outcome: Progressing   Problem: Nutritional: Goal: Maintenance of adequate nutrition will improve Outcome: Progressing   Problem: Skin Integrity: Goal: Risk for impaired skin integrity will decrease Outcome: Progressing   Problem: Clinical Measurements: Goal: Ability to maintain clinical measurements within normal limits will improve Outcome: Progressing Goal: Will remain free from infection Outcome: Progressing Goal: Diagnostic test results will improve Outcome: Progressing Goal: Respiratory complications will improve Outcome: Progressing Goal: Cardiovascular complication will be avoided Outcome: Progressing   Problem: Education: Goal: Knowledge of disease or condition will improve Outcome: Progressing Goal: Knowledge of secondary prevention will improve (MUST DOCUMENT ALL) Outcome: Progressing Goal: Knowledge of patient specific risk factors will improve (DELETE if not current risk factor) Outcome: Progressing   Problem: Intracerebral Hemorrhage Tissue Perfusion: Goal: Complications of Intracerebral Hemorrhage will be minimized Outcome: Progressing   Problem: Coping: Goal: Will verbalize positive feelings about self Outcome: Progressing

## 2024-02-28 NOTE — Progress Notes (Signed)
 Progress Note   Patient: Amanda [name] FMW:[id number] DOB: 1960/06/15 DOA: 01/03/2024     56 DOS: the patient was seen and examined on 02/28/2024   Brief hospital course: Jordayn Mink is 63 year old female with history of hypertension, cocaine abuse, diabetes type 2 who presented with lethargy, near syncope.  Found to be severely hypertensive, UDS positive for cocaine, with acute intraparenchymal hemorrhage , intraventricular hemorrhage and hydrocephalus.  Patient was admitted to ICU for close blood pressure management.  Neurosurgery consulted.  Status post right frontal ventriculostomy. EVD eventually removed on 10/3. Hospital course remarkable for dysphagia. Dietitian/speech therapy recommending PEG placement. PT/OT recommending SNF on discharge.  S/o PEG placement by IR on 10/16.  Tube feeds stopped 02/18/24, currently on supplements, plan to remove PEG 03/19/24 if oral intake better. On and off agitated, requiring restraints. TOC following, placement challenging.  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 Continues to receive PT. Plan for discharge to SNF.  EVD removed 01/14/2024, scalp staples removed 02/01/2024.   On and off agitation requiring physical and chemical restraints. Still pending bed offer per TOC. Challenging placement.   Hypertension:  Continue carvedilol  25 mg twice daily, Entresto .   Amlodipine  discontinued due to history of systolic CHF and blood pressure now optimal.   Dysphagia:  Moderate malnutrition- S/P PEG placement on 10/16. Currently on dysphagia 3 diet and eating better, meeting all the nutritional demands, dietitian on board, tube feeds stopped 02/18/2024.  Continue protein supplements.  Plan to remove PEG tube around 03/19/2024 if continues to meet her nutritional demands.   Chronic combined systolic/diastolic CHF:  continue Entresto   and spironolactone .  Not on aspirin  due to recent hemorrhage.    Type 2 diabetes: Recent A1c of 6.6. Glucose controlled on sliding scale insulin .   Cocaine abuse: Consulted TOC for cocaine cessation resources.  S/p fall- Continue delirium precautions. Restraints for safety. Fall precautions.     Out of bed to chair. Incentive spirometry. Nursing supportive care. Fall, aspiration precautions. Diet:  Diet Orders (From admission, onward)     Start     Ordered   02/13/24 1819  DIET DYS 3 Room service appropriate? Yes; Fluid consistency: Thin  Diet effective now       Question Answer Comment  Room service appropriate? Yes   Fluid consistency: Thin      02/13/24 1820           DVT prophylaxis: enoxaparin  (LOVENOX ) injection 40 mg Start: 02/21/24 1430 Place and maintain sequential compression device Start: 01/03/24 0611 SCDs Start: 01/03/24 0429  Level of care: Progressive   Code Status: Full Code  Subjective: Patient is seen and examined today morning. Appears calm and comfortable. Follows commands  Physical Exam: Vitals:   02/28/24 0330 02/28/24 0811 02/28/24 0922 02/28/24 1220  BP: 100/65 110/72 108/80 91/74  Pulse: 82 73 88 84  Resp: 16 20  20   Temp: 98.4 F (36.9 C) 97.8 F (36.6 C)  98.4 F (36.9 C)  TempSrc: Oral Oral  Oral  SpO2: 98% 96%  93%  Weight:      Height:        General - Elderly African American female, no apparent distress HEENT - PERRLA, EOMI, atraumatic head, non tender sinuses. Lung - Clear, no rales, rhonchi, wheezes. Heart - S1, S2 heard, no murmurs, rubs, no pedal edema. Abdomen - Soft, non tender, bowel sounds good, PEG intact Neuro - Alert, awake and calm,  strength symmetric and full in both upper and lower extremities Skin - Warm and dry.  Data Reviewed:      Latest Ref Rng & Units 01/29/2024    6:33 AM 01/24/2024    1:37 AM 01/15/2024    6:23 AM  CBC  WBC 4.0 - 10.5 K/uL 7.2  7.4  8.9   Hemoglobin 12.0 - 15.0 g/dL 86.6   87.2  86.8   Hematocrit 36.0 - 46.0 % 40.1  39.4  39.7   Platelets 150 - 400 K/uL 284  444  408       Latest Ref Rng & Units 01/29/2024    6:33 AM 01/24/2024    1:37 AM 01/21/2024    6:03 AM  BMP  Glucose 70 - 99 mg/dL 826  862  847   BUN 8 - 23 mg/dL 36  29  33   Creatinine 0.44 - 1.00 mg/dL 9.13  9.28  9.16   Sodium 135 - 145 mmol/L 135  141  139   Potassium 3.5 - 5.1 mmol/L 4.0  4.2  4.2   Chloride 98 - 111 mmol/L 99  106  104   CO2 22 - 32 mmol/L 24  24  20    Calcium 8.9 - 10.3 mg/dL 9.9  9.2  9.3    No results found.  Family Communication: no family at bedside.  Disposition: Status is: Inpatient Remains inpatient appropriate because: pending safe discharge plan  Planned Discharge Destination: Skilled nursing facility     Time spent: 46 minutes  Author: Derryl Duval, MD 02/28/2024 3:04 PM Secure chat 7am to 7pm For on call review [email or web address].

## 2024-02-28 NOTE — Plan of Care (Signed)
  Problem: Fluid Volume: Goal: Ability to maintain a balanced intake and output will improve Outcome: Progressing   Problem: Nutritional: Goal: Maintenance of adequate nutrition will improve Outcome: Progressing   Problem: Skin Integrity: Goal: Risk for impaired skin integrity will decrease Outcome: Progressing   Problem: Clinical Measurements: Goal: Ability to maintain clinical measurements within normal limits will improve Outcome: Progressing Goal: Will remain free from infection Outcome: Progressing Goal: Diagnostic test results will improve Outcome: Progressing Goal: Respiratory complications will improve Outcome: Progressing Goal: Cardiovascular complication will be avoided Outcome: Progressing

## 2024-02-29 DIAGNOSIS — I610 Nontraumatic intracerebral hemorrhage in hemisphere, subcortical: Secondary | ICD-10-CM | POA: Diagnosis not present

## 2024-02-29 NOTE — Progress Notes (Addendum)
 Nutrition Follow-up  DOCUMENTATION CODES:  Non-severe (moderate) malnutrition in context of social or environmental circumstances (polysubstance abuse)  INTERVENTION:  Continue current diet as ordered per SLP Encourage PO intake  Changed to ordering assistance Ensure Plus High Protein po TID, each supplement provides 350 kcal and 20 grams of protein MVI with minerals daily   NUTRITION DIAGNOSIS:  Moderate Malnutrition related to social / environmental circumstances (polysubstance abuse) as evidenced by mild muscle depletion, moderate fat depletion, mild fat depletion, moderate muscle depletion. - remains applicable  GOAL:  Patient will meet greater than or equal to 90% of their needs - progressing   MONITOR:  PO intake, Supplement acceptance, Labs, Skin  REASON FOR ASSESSMENT:  Consult Enteral/tube feeding initiation and management  ASSESSMENT:  Pt with hx HTN, CHF, type 2 diabetes, polysubstance abuse, and pulmonary edema. Admitted with AMS, lethargy, and deteriorating condition requiring intubation; diagnosed ICH.  9/22 - admitted, intubated, EVD placed 9/23 - extubated 9/24 - s/p cortrak placement; tip gastric  9/25 - Diet advanced to Dysphagia 2/thin; TF stopped 9/26 - back down to Full liquids, TF resumed  10/13- upgraded to DYS 3, but SLP recommends PEG 10/16 - PEG placed in IR 11/7 - pt meeting nutrition needs s/p kcal count, TF discontinued  Pt resting in bed at the time of assessment awake, calm, and conversant. Pt reports that breakfast went pretty well, but that she didn't get what she wanted. Brought pt a menu and discussed how to order meals. Will also adjust to ordering assistance to ensure pt can have her preferences met.   Inquired about ensure to which pt says she is drinking all of them and enjoys both flavors. Will leave ordered TID to encourage weight gain and adequate intake. Medically stable to discharge when placement is secured. RD team will continue  to follow peripherally while admitted. Please submit a new RD consult if acute needs arise in between assessments.   Admit weight: 56.1 kg  Current weight: 51 kg    Average Meal Intake: 10/6-10/8: 0% intake x 4 recorded meals 10/6-10/12: 0% intake x 7 recorded meals 10/14-10/15: 14% x 4 recorded meals 10/24-11/4: 57% intake x 6 recorded meals 11/6-11/11: 79% intake x 4 recorded meals 11/15-11/17: 44% intake x 7 recorded meals  Nutritionally Relevant Medications: Scheduled Meds:  Ensure Plus High Protein  237 mL Oral TID BM   free water   50 mL Per Tube Daily   spironolactone   25 mg Oral Daily   PRN Meds: polyethylene glycol  Labs Reviewed: HgbA1c 6.6%  Diet Order:   Diet Order             DIET DYS 3 Room service appropriate? Yes with Assist; Fluid consistency: Thin  Diet effective now                  EDUCATION NEEDS:  Not appropriate for education at this time  Skin:  Skin Assessment: Skin Integrity Issues: Skin Integrity Issues:: Incisions Incisions: closed surgical incision head  Last BM:  11/18 - type 4  Height:  Ht Readings from Last 1 Encounters:  01/03/24 5' 5 (1.651 m)    Weight:  Wt Readings from Last 1 Encounters:  02/25/24 51 kg    Ideal Body Weight:  56.8 kg  BMI:  Body mass index is 18.71 kg/m.  Estimated Nutritional Needs:  Kcal:  1500-1700 Protein:  80-90 grams Fluid:  1.5-1.7L   Vernell Lukes, RD, LDN, CNSC Registered Dietitian II Please reach out via secure  chat

## 2024-02-29 NOTE — Progress Notes (Signed)
 Occupational Therapy Treatment Patient Details Name: Amanda [name] MRN: [id number] DOB: 09-05-1960 Today's Date: 02/29/2024   History of present illness 63 yo female presents to [facility] on 9/22 with lethargy and near-syncope s/p cocaine and opioid ingestion. 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 S/p R frontal ventriculostomy, burr holes 9/22. ETT 9/22-9/23. PMHx of cocaine abuse, CHF, CKD, chronic cough, HTN and DM2.   OT comments  Pt with slow but steady progression toward established goals this session. Pt with improved safety with RW use tapering from max to min cues for body positioning within RW during functional mobility. Oriented to self and place; loosely to time (knows Thanksgiving is coming up). Engaged in visual scanning tasks with pt needing up to max cues to scan all the way to the L of the environment/area to be considered. Also bumping into items on the L during functional mobility, needing cues for self correction. Will continue to follow acutely. Patient will benefit from continued inpatient follow up therapy, <3 hours/day       If plan is discharge home, recommend the following:  A lot of help with walking and/or transfers;A lot of help with bathing/dressing/bathroom;Assistance with cooking/housework;Help with stairs or ramp for entrance;Direct supervision/assist for financial management;Direct supervision/assist for medications management;Assist for transportation;Supervision due to cognitive status   Equipment Recommendations  Other (comment) (defer)    Recommendations for Other Services      Precautions / Restrictions Precautions Precautions: Fall;Other (comment) Recall of Precautions/Restrictions: Impaired Precaution/Restrictions Comments: PEG, SBP < 160, impulsive Restrictions Weight Bearing Restrictions Per Provider Order: No       Mobility Bed Mobility Overal bed  mobility: Needs Assistance Bed Mobility: Sit to Supine       Sit to supine: Mod assist   General bed mobility comments: to bring BLE into bed    Transfers Overall transfer level: Needs assistance Equipment used: Rolling walker (2 wheels) Transfers: Sit to/from Stand Sit to Stand: Min assist           General transfer comment: Light min A for rise     Balance Overall balance assessment: Needs assistance Sitting-balance support: No upper extremity supported, Feet supported, Single extremity supported Sitting balance-Leahy Scale: Fair Sitting balance - Comments: CGA sitting EOB   Standing balance support: Reliant on assistive device for balance, Bilateral upper extremity supported, During functional activity Standing balance-Leahy Scale: Poor Standing balance comment: reliant on RW support and intermittent external support                           ADL either performed or assessed with clinical judgement   ADL Overall ADL's : Needs assistance/impaired       Grooming Details (indicate cue type and reason): walked to sink, but pt seeing self in mirror refuses to engage in any ADL at sink and immediately turns away stating she does not like the way she looks             Lower Body Dressing: Moderate assistance;Sitting/lateral leans Lower Body Dressing Details (indicate cue type and reason): with dense cues to change socks Toilet Transfer: Minimal assistance;Ambulation;Rolling walker (2 wheels)           Functional mobility during ADLs: Minimal assistance;Rolling walker (2 wheels)      Extremity/Trunk Assessment              Vision   Vision Assessment?: Vision impaired- to be further  tested in functional context Additional Comments: poor attention to L side of environment during mobility and while engaged in scanning task standing at bulletin board. max cues to locate items all the way to L of board. Pt very internally distracted in hall needing  max redirection for appropriate scanning methods. bumps into items on L including the wall and door frames   Perception Perception Perception: Impaired Preception Impairment Details: Inattention/Neglect Perception-Other Comments: L inattention.   Praxis     Communication Communication Communication: No apparent difficulties Factors Affecting Communication: Reduced clarity of speech   Cognition Arousal: Alert Behavior During Therapy: Flat affect, Impulsive Cognition: Cognition impaired   Orientation impairments: Situation, Time Awareness: Intellectual awareness impaired Memory impairment (select all impairments): Short-term memory, Working memory Attention impairment (select first level of impairment): Sustained attention Executive functioning impairment (select all impairments): Initiation, Sequencing, Reasoning, Problem solving OT - Cognition Comments: Continues to be impulsive but pleasant, needs significant cueing for safely navigating environment at times. doing much better with safe RN management in during functional mobility.                 Following commands: Impaired Following commands impaired: Follows one step commands with increased time, Follows one step commands inconsistently (does not follow multistep commands this session)      Cueing   Cueing Techniques: Verbal cues, Visual cues, Gestural cues  Exercises      Shoulder Instructions       General Comments VSS on RA    Pertinent Vitals/ Pain       Pain Assessment Pain Assessment: Faces Faces Pain Scale: No hurt  Home Living                                          Prior Functioning/Environment              Frequency  Min 1X/week        Progress Toward Goals  OT Goals(current goals can now be found in the care plan section)  Progress towards OT goals: Progressing toward goals  Acute Rehab OT Goals Patient Stated Goal: go outside and sit on her back porch OT Goal  Formulation: With patient Time For Goal Achievement: 03/07/24 Potential to Achieve Goals: Fair ADL Goals Pt Will Perform Eating: with set-up;sitting Pt Will Perform Grooming: with contact guard assist;standing Pt Will Transfer to Toilet: with contact guard assist;ambulating Additional ADL Goal #1: Pt will attend to items on L side and properly orient self to sink/task at hand 50% of the time, in preparation for ADLs performed in standing. Additional ADL Goal #2: Pt will follow 1-step commands 100% of the time during ADLs and functional mobility to demonstrate improved attention.  Plan      Co-evaluation                 AM-PAC OT 6 Clicks Daily Activity     Outcome Measure   Help from another person eating meals?: A Little Help from another person taking care of personal grooming?: A Little Help from another person toileting, which includes using toliet, bedpan, or urinal?: A Lot Help from another person bathing (including washing, rinsing, drying)?: A Lot Help from another person to put on and taking off regular upper body clothing?: A Little Help from another person to put on and taking off regular lower body clothing?: A Lot 6 Click Score: 15  End of Session Equipment Utilized During Treatment: Gait belt;Rolling walker (2 wheels)  OT Visit Diagnosis: Unsteadiness on feet (R26.81);Other abnormalities of gait and mobility (R26.89);Muscle weakness (generalized) (M62.81)   Activity Tolerance Patient tolerated treatment well   Patient Left in bed;with call bell/phone within reach;with bed alarm set   Nurse Communication Mobility status        Time: 8484-8465 OT Time Calculation (min): 19 min  Charges: OT General Charges $OT Visit: 1 Visit OT Treatments $Therapeutic Activity: 8-22 mins  Elma JONETTA Lebron FREDERICK, OTR/L [facility] Acute Rehabilitation Office: [phone]   Elma JONETTA Lebron 02/29/2024, 4:51 PM

## 2024-02-29 NOTE — Plan of Care (Signed)
  Problem: Education: Goal: Ability to describe self-care measures that may prevent or decrease complications (Diabetes Survival Skills Education) will improve Outcome: Progressing   Problem: Fluid Volume: Goal: Ability to maintain a balanced intake and output will improve Outcome: Progressing   Problem: Health Behavior/Discharge Planning: Goal: Ability to identify and utilize available resources and services will improve Outcome: Progressing

## 2024-02-29 NOTE — Progress Notes (Signed)
 Physical Therapy Treatment Patient Details Name: Amanda [name] MRN: [id number] DOB: 1960/09/14 Today's Date: 02/29/2024   History of Present Illness 63 yo female presents to [facility] on 9/22 with lethargy and near-syncope s/p cocaine and opioid ingestion. 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 S/p R frontal ventriculostomy, burr holes 9/22. ETT 9/22-9/23. PMHx of cocaine abuse, CHF, CKD, chronic cough, HTN and DM2.    PT Comments  Pt received in supine and agreeable to session. Pt demonstrates improved focus on mobility tasks with intermittent redirection. Pt able to perform bed mobility, transfers, and ambulation with grossly min A. Pt able to participate in visual scanning task requiring cues for attention to task and to L side. Pt continues to benefit from PT services to progress toward functional mobility goals.    If plan is discharge home, recommend the following: Supervision due to cognitive status;Assist for transportation;Help with stairs or ramp for entrance;Assistance with cooking/housework;Direct supervision/assist for medications management;Direct supervision/assist for financial management;A lot of help with walking and/or transfers;A lot of help with bathing/dressing/bathroom   Can travel by private vehicle     No  Equipment Recommendations  Rolling walker (2 wheels);BSC/3in1    Recommendations for Other Services       Precautions / Restrictions Precautions Precautions: Fall;Other (comment) Recall of Precautions/Restrictions: Impaired Precaution/Restrictions Comments: PEG, SBP < 160, impulsive Restrictions Weight Bearing Restrictions Per Provider Order: No     Mobility  Bed Mobility Overal bed mobility: Needs Assistance Bed Mobility: Supine to Sit     Supine to sit: Min assist, HOB elevated     General bed mobility comments: Cues for attention to task and LLE due to  distraction and inattention. Assist for trunk elevation    Transfers Overall transfer level: Needs assistance Equipment used: Rolling walker (2 wheels) Transfers: Sit to/from Stand Sit to Stand: Min assist           General transfer comment: Light min A for rise    Ambulation/Gait Ambulation/Gait assistance: Min assist, Contact guard assist Gait Distance (Feet): 75 Feet Assistive device: Rolling walker (2 wheels) Gait Pattern/deviations: Step-through pattern, Trunk flexed, Decreased stride length, Shuffle Gait velocity: reduced     General Gait Details: Pt demonstrates short, shuffled steps with low foot clearance with Pt able to improve briefly with frequent cues. Cues for upright posture and RW proximity. Increased instability during turns and with dual task requiring intermittent min A   Stairs             Wheelchair Mobility     Tilt Bed    Modified Rankin (Stroke Patients Only) Modified Rankin (Stroke Patients Only) Pre-Morbid Rankin Score: No symptoms Modified Rankin: Moderately severe disability     Balance Overall balance assessment: Needs assistance Sitting-balance support: No upper extremity supported, Feet supported, Single extremity supported Sitting balance-Leahy Scale: Fair Sitting balance - Comments: CGA sitting EOB   Standing balance support: Reliant on assistive device for balance, Bilateral upper extremity supported, During functional activity Standing balance-Leahy Scale: Poor Standing balance comment: reliant on RW support                            Communication Communication Communication: No apparent difficulties Factors Affecting Communication: Reduced clarity of speech  Cognition Arousal: Alert Behavior During Therapy: Flat affect, Impulsive   PT - Cognitive impairments: Awareness, Memory, Attention, Problem solving, Safety/Judgement  Following commands: Impaired Following  commands impaired: Follows multi-step commands inconsistently, Follows one step commands with increased time, Follows one step commands inconsistently    Cueing Cueing Techniques: Verbal cues, Visual cues, Gestural cues  Exercises Other Exercises Other Exercises: static standing at board with pt able to point out indicated objects with min-max cues    General Comments        Pertinent Vitals/Pain Pain Assessment Pain Assessment: Faces Faces Pain Scale: No hurt     PT Goals (current goals can now be found in the care plan section) Acute Rehab PT Goals Patient Stated Goal: to go home PT Goal Formulation: With patient Time For Goal Achievement: 02/24/24 Progress towards PT goals: Progressing toward goals    Frequency    Min 2X/week       AM-PAC PT 6 Clicks Mobility   Outcome Measure  Help needed turning from your back to your side while in a flat bed without using bedrails?: A Little Help needed moving from lying on your back to sitting on the side of a flat bed without using bedrails?: A Little Help needed moving to and from a bed to a chair (including a wheelchair)?: A Little Help needed standing up from a chair using your arms (e.g., wheelchair or bedside chair)?: A Little Help needed to walk in hospital room?: A Little Help needed climbing 3-5 steps with a railing? : A Lot 6 Click Score: 17    End of Session Equipment Utilized During Treatment: Gait belt Activity Tolerance: Patient tolerated treatment well Patient left: Other (comment) (in hall with OT) Nurse Communication: Mobility status PT Visit Diagnosis: Unsteadiness on feet (R26.81);Difficulty in walking, not elsewhere classified (R26.2);Other abnormalities of gait and mobility (R26.89);Other symptoms and signs involving the nervous system (R29.898);Muscle weakness (generalized) (M62.81)     Time: 8540-8484 PT Time Calculation (min) (ACUTE ONLY): 16 min  Charges:    $Gait Training: 8-22 mins PT  General Charges $$ ACUTE PT VISIT: 1 Visit                Darryle George, PTA Acute Rehabilitation Services Secure Chat Preferred  [phone]    Darryle George 02/29/2024, 3:25 PM

## 2024-02-29 NOTE — Progress Notes (Signed)
 Progress Note   Patient: Amanda [name] FMW:[id number] DOB: 1960-11-10 DOA: 01/03/2024     57 DOS: the patient was seen and examined on 02/29/2024   Brief hospital course: Amanda [name] is 63 year old female with history of hypertension, cocaine abuse, diabetes type 2 who presented with lethargy, near syncope.  Found to be severely hypertensive, UDS positive for cocaine, with acute intraparenchymal hemorrhage , intraventricular hemorrhage and hydrocephalus.  Patient was admitted to ICU for close blood pressure management.  Neurosurgery consulted.  Status post right frontal ventriculostomy. EVD eventually removed on 10/3. Hospital course remarkable for dysphagia. Dietitian/speech therapy recommending PEG placement. PT/OT recommending SNF on discharge.  S/o PEG placement by IR on 10/16.  Tube feeds stopped 02/18/24, currently on supplements, plan to remove PEG 03/19/24 if oral intake better. On and off agitated and did require restraints. TOC following, placement challenging.  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 Continues to receive PT. Plan for discharge to SNF.  EVD removed 01/14/2024, scalp staples removed 02/01/2024.   No reports of agitations recently Still pending bed offer per TOC. Challenging placement.   Hypertension:  Continue carvedilol  25 mg twice daily, Entresto .   Amlodipine  discontinued due to history of systolic CHF and blood pressure now optimal.   Dysphagia:  Moderate malnutrition- S/P PEG placement on 10/16. Currently on dysphagia 3 diet and eating better, meeting all the nutritional demands, dietitian on board, tube feeds stopped 02/18/2024.  Continue protein supplements.  Plan to remove PEG tube around 03/19/2024 if continues to meet her nutritional demands.   Chronic combined systolic/diastolic CHF:  continue Entresto  and spironolactone .  Not on  aspirin  due to recent hemorrhage.    Type 2 diabetes: Recent A1c of 6.6. Glucose controlled on sliding scale insulin .   Cocaine abuse: Consulted TOC for cocaine cessation resources.  S/p fall- Continue delirium precautions. Restraints for safety prn. Fall precautions.     Out of bed to chair. Incentive spirometry. Nursing supportive care. Fall, aspiration precautions. Diet:  Diet Orders (From admission, onward)     Start     Ordered   02/13/24 1819  DIET DYS 3 Room service appropriate? Yes; Fluid consistency: Thin  Diet effective now       Question Answer Comment  Room service appropriate? Yes   Fluid consistency: Thin      02/13/24 1820           DVT prophylaxis: enoxaparin  (LOVENOX ) injection 40 mg Start: 02/21/24 1430 Place and maintain sequential compression device Start: 01/03/24 0611 SCDs Start: 01/03/24 0429  Level of care: Progressive   Code Status: Full Code  Subjective: Patient is seen and examined today morning. Appears calm and comfortable. Follows commands  Physical Exam: Vitals:   02/28/24 2027 02/28/24 2346 02/29/24 0354 02/29/24 0806  BP: 121/74 116/76 121/87 126/84  Pulse: 82 75 72 80  Resp: 16 18 18 16   Temp: 98.4 F (36.9 C) (!) 97.4 F (36.3 C) 97.7 F (36.5 C) 98.8 F (37.1 C)  TempSrc: Oral Oral Oral   SpO2: 96% 96% 97% 98%  Weight:      Height:        General - Elderly African American female, no apparent distress HEENT - PERRLA, EOMI, atraumatic head, non tender sinuses. Lung - Clear, no rales, rhonchi, wheezes. Heart - S1, S2 heard, no murmurs, rubs, no pedal edema. Abdomen - Soft, non tender, bowel sounds good, PEG intact Neuro - Alert,  awake and calm, strength symmetric and full in both upper and lower extremities Skin - Warm and dry.  Data Reviewed:      Latest Ref Rng & Units 01/29/2024    6:33 AM 01/24/2024    1:37 AM 01/15/2024    6:23 AM  CBC  WBC 4.0 - 10.5 K/uL 7.2  7.4  8.9   Hemoglobin 12.0 - 15.0 g/dL  86.6  87.2  86.8   Hematocrit 36.0 - 46.0 % 40.1  39.4  39.7   Platelets 150 - 400 K/uL 284  444  408       Latest Ref Rng & Units 01/29/2024    6:33 AM 01/24/2024    1:37 AM 01/21/2024    6:03 AM  BMP  Glucose 70 - 99 mg/dL 826  862  847   BUN 8 - 23 mg/dL 36  29  33   Creatinine 0.44 - 1.00 mg/dL 9.13  9.28  9.16   Sodium 135 - 145 mmol/L 135  141  139   Potassium 3.5 - 5.1 mmol/L 4.0  4.2  4.2   Chloride 98 - 111 mmol/L 99  106  104   CO2 22 - 32 mmol/L 24  24  20    Calcium 8.9 - 10.3 mg/dL 9.9  9.2  9.3    No results found.  Family Communication: no family at bedside.  Disposition: Status is: Inpatient Remains inpatient appropriate because: pending safe discharge plan  Planned Discharge Destination: Skilled nursing facility     Time spent: 46 minutes  Author: Derryl Duval, MD 02/29/2024 8:10 AM Secure chat 7am to 7pm For on call review [email or web address].

## 2024-03-01 DIAGNOSIS — I610 Nontraumatic intracerebral hemorrhage in hemisphere, subcortical: Secondary | ICD-10-CM | POA: Diagnosis not present

## 2024-03-01 NOTE — Plan of Care (Signed)
   Problem: Coping: Goal: Ability to adjust to condition or change in health will improve Outcome: Progressing   Problem: Fluid Volume: Goal: Ability to maintain a balanced intake and output will improve Outcome: Progressing   Problem: Health Behavior/Discharge Planning: Goal: Ability to identify and utilize available resources and services will improve Outcome: Progressing

## 2024-03-01 NOTE — Progress Notes (Signed)
 Progress Note   Patient: Amanda [name] FMW:[id number] DOB: May 19, 1960 DOA: 01/03/2024     58 DOS: the patient was seen and examined on 03/01/2024   Brief hospital course: Amanda [name] is 63 year old female with history of hypertension, cocaine abuse, diabetes type 2 who presented with lethargy, near syncope.  Found to be severely hypertensive, UDS positive for cocaine, with acute intraparenchymal hemorrhage , intraventricular hemorrhage and hydrocephalus.  Patient was admitted to ICU for close blood pressure management.  Neurosurgery consulted.  Status post right frontal ventriculostomy. EVD eventually removed on 10/3. Hospital course remarkable for dysphagia. Dietitian/speech therapy recommending PEG placement. PT/OT recommending SNF on discharge.  S/o PEG placement by IR on 10/16.  Tube feeds stopped 02/18/24, currently on supplements, plan to remove PEG 03/19/24 if oral intake better. On and off agitated and did require restraints. TOC following, placement challenging.  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 Continues to receive PT. Plan for discharge to SNF.  EVD removed 01/14/2024, scalp staples removed 02/01/2024.   No reports of agitations recently Still pending bed offer per TOC. Challenging placement.   Hypertension:  Continue carvedilol  25 mg twice daily, Entresto .   Amlodipine  discontinued due to history of systolic CHF and blood pressure now optimal.   Dysphagia:  Moderate malnutrition- S/P PEG placement on 10/16. Currently on dysphagia 3 diet and eating better, meeting all the nutritional demands, dietitian on board, tube feeds stopped 02/18/2024.  Continue protein supplements.  Plan to remove PEG tube around 03/19/2024 if continues to meet her nutritional demands.   Chronic combined systolic/diastolic CHF:  continue Entresto  and spironolactone .  Not on  aspirin  due to recent hemorrhage.    Type 2 diabetes: Recent A1c of 6.6. Glucose controlled on sliding scale insulin .   Cocaine abuse: Consulted TOC for cocaine cessation resources.  S/p fall- Continue delirium precautions. Restraints for safety prn. Fall precautions.     Out of bed to chair. Incentive spirometry. Nursing supportive care. Fall, aspiration precautions. Diet:  Diet Orders (From admission, onward)     Start     Ordered   02/29/24 1041  DIET DYS 3 Room service appropriate? Yes with Assist; Fluid consistency: Thin  Diet effective now       Question Answer Comment  Room service appropriate? Yes with Assist   Fluid consistency: Thin      02/29/24 1041           DVT prophylaxis: enoxaparin  (LOVENOX ) injection 40 mg Start: 02/21/24 1430 Place and maintain sequential compression device Start: 01/03/24 0611 SCDs Start: 01/03/24 0429  Level of care: Progressive   Code Status: Full Code  Subjective: Patient is seen and examined today morning. Appears calm and comfortable. Follows commands  Physical Exam: Vitals:   02/29/24 1952 03/01/24 0058 03/01/24 0251 03/01/24 0744  BP: 129/78 104/71 101/75 124/76  Pulse: 78 69 68 78  Resp: 18 18 18 16   Temp: 98.4 F (36.9 C) 98.4 F (36.9 C) 98.2 F (36.8 C) 98.3 F (36.8 C)  TempSrc: Oral     SpO2: 99% 100% 96% 98%  Weight:      Height:        General - Elderly African American female, no apparent distress HEENT - PERRLA, EOMI, atraumatic head, non tender sinuses. Lung - Clear, no rales, rhonchi, wheezes. Heart - S1, S2 heard, no murmurs, rubs, no pedal edema. Abdomen - Soft, non tender, bowel sounds good, PEG intact  Neuro - Alert, awake and calm, strength symmetric and full in both upper and lower extremities Skin - Warm and dry.  Data Reviewed:      Latest Ref Rng & Units 01/29/2024    6:33 AM 01/24/2024    1:37 AM 01/15/2024    6:23 AM  CBC  WBC 4.0 - 10.5 K/uL 7.2  7.4  8.9   Hemoglobin 12.0 -  15.0 g/dL 86.6  87.2  86.8   Hematocrit 36.0 - 46.0 % 40.1  39.4  39.7   Platelets 150 - 400 K/uL 284  444  408       Latest Ref Rng & Units 01/29/2024    6:33 AM 01/24/2024    1:37 AM 01/21/2024    6:03 AM  BMP  Glucose 70 - 99 mg/dL 826  862  847   BUN 8 - 23 mg/dL 36  29  33   Creatinine 0.44 - 1.00 mg/dL 9.13  9.28  9.16   Sodium 135 - 145 mmol/L 135  141  139   Potassium 3.5 - 5.1 mmol/L 4.0  4.2  4.2   Chloride 98 - 111 mmol/L 99  106  104   CO2 22 - 32 mmol/L 24  24  20    Calcium 8.9 - 10.3 mg/dL 9.9  9.2  9.3    No results found.  Family Communication: no family at bedside.  Disposition: Status is: Inpatient Remains inpatient appropriate because: pending safe discharge plan  Planned Discharge Destination: Skilled nursing facility   Author: Derryl Duval, MD 03/01/2024 8:27 AM Secure chat 7am to 7pm For on call review [email or web address].

## 2024-03-02 DIAGNOSIS — I610 Nontraumatic intracerebral hemorrhage in hemisphere, subcortical: Secondary | ICD-10-CM | POA: Diagnosis not present

## 2024-03-02 NOTE — Plan of Care (Signed)
  Problem: Education: Goal: Ability to describe self-care measures that may prevent or decrease complications (Diabetes Survival Skills Education) will improve Outcome: Not Progressing   Problem: Coping: Goal: Ability to adjust to condition or change in health will improve Outcome: Not Progressing   Problem: Fluid Volume: Goal: Ability to maintain a balanced intake and output will improve Outcome: Not Progressing   Problem: Metabolic: Goal: Ability to maintain appropriate glucose levels will improve Outcome: Not Progressing   Problem: Nutritional: Goal: Maintenance of adequate nutrition will improve Outcome: Not Progressing Goal: Progress toward achieving an optimal weight will improve Outcome: Not Progressing   Problem: Skin Integrity: Goal: Risk for impaired skin integrity will decrease Outcome: Not Progressing

## 2024-03-02 NOTE — Plan of Care (Signed)
 Ambulating with PT in halls with walker today.    Problem: Education: Goal: Ability to describe self-care measures that may prevent or decrease complications (Diabetes Survival Skills Education) will improve Outcome: Progressing   Problem: Nutritional: Goal: Maintenance of adequate nutrition will improve Outcome: Progressing   Problem: Nutrition: Goal: Risk of aspiration will decrease Outcome: Progressing   Problem: Safety: Goal: Ability to remain free from injury will improve Outcome: Progressing   Problem: Skin Integrity: Goal: Risk for impaired skin integrity will decrease Outcome: Progressing

## 2024-03-02 NOTE — Progress Notes (Signed)
 Physical Therapy Treatment Patient Details Name: Amanda [name] MRN: [id number] DOB: Aug 07, 1960 Today's Date: 03/02/2024   History of Present Illness 63 yo female presents to [facility] on 9/22 with lethargy and near-syncope s/p cocaine and opioid ingestion. 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 S/p R frontal ventriculostomy, burr holes 9/22. ETT 9/22-9/23. PMHx of cocaine abuse, CHF, CKD, chronic cough, HTN and DM2.    PT Comments  Pt received in supine and agreeable to session. Pt demonstrates improved attention and command following this session, but continues to require increased cues and assist for safety. Pt noted to have urine incontinence and requires assist with hygiene. Pt requires cues for awareness and problem solving during tasks. Pt able to participate in visual scanning task with min cues. Pt able to complete gait and stair trials with min A and reliance on UE support for balance. Pt continues to benefit from PT services to progress toward functional mobility goals.     If plan is discharge home, recommend the following: Supervision due to cognitive status;Assist for transportation;Help with stairs or ramp for entrance;Assistance with cooking/housework;Direct supervision/assist for medications management;Direct supervision/assist for financial management;A lot of help with walking and/or transfers;A lot of help with bathing/dressing/bathroom   Can travel by private vehicle     No  Equipment Recommendations  Rolling walker (2 wheels);BSC/3in1    Recommendations for Other Services       Precautions / Restrictions Precautions Precautions: Fall;Other (comment) Recall of Precautions/Restrictions: Impaired Precaution/Restrictions Comments: PEG, SBP < 160, impulsive Restrictions Weight Bearing Restrictions Per Provider Order: No     Mobility  Bed Mobility Overal bed mobility: Needs  Assistance Bed Mobility: Supine to Sit, Sit to Supine     Supine to sit: Min assist, HOB elevated Sit to supine: Min assist   General bed mobility comments: assist for trunk elevation and BLE elevation back to EOB    Transfers Overall transfer level: Needs assistance Equipment used: Rolling walker (2 wheels) Transfers: Sit to/from Stand Sit to Stand: Min assist           General transfer comment: light min A for steadying    Ambulation/Gait Ambulation/Gait assistance: Min assist, Contact guard assist Gait Distance (Feet): 100 Feet Assistive device: Rolling walker (2 wheels) Gait Pattern/deviations: Step-through pattern, Trunk flexed, Decreased stride length, Shuffle Gait velocity: reduced     General Gait Details: Pt demonstrates short, shuffled steps with low foot clearance with Pt able to improve briefly with frequent cues. Cues for upright posture and RW proximity. Intermittent min A due to instability and distraction   Stairs Stairs: Yes Stairs assistance: Min assist Stair Management: Two rails, Forwards Number of Stairs: 2 (x2) General stair comments: Pt demonstrates heavy reliance on rail support and min A for stability and safety. Decreased problem solving during descent requiring increased cues for technique and sequencing   Wheelchair Mobility     Tilt Bed    Modified Rankin (Stroke Patients Only) Modified Rankin (Stroke Patients Only) Pre-Morbid Rankin Score: No symptoms Modified Rankin: Moderately severe disability     Balance Overall balance assessment: Needs assistance Sitting-balance support: No upper extremity supported, Feet supported, Single extremity supported Sitting balance-Leahy Scale: Poor Sitting balance - Comments: Multiple posterior LOB requiring mod A to correct when initially sitting EOB. pt demonstrates decreased awareness and problem solving to correct   Standing balance support: Reliant on assistive device for balance,  Bilateral upper extremity supported, During functional activity Standing balance-Leahy Scale:  Poor Standing balance comment: reliant on RW support and intermittent external support                            Communication Communication Communication: No apparent difficulties Factors Affecting Communication: Reduced clarity of speech  Cognition Arousal: Alert Behavior During Therapy: Impulsive   PT - Cognitive impairments: Awareness, Memory, Attention, Problem solving, Safety/Judgement                         Following commands: Impaired Following commands impaired: Follows one step commands with increased time, Follows one step commands inconsistently    Cueing Cueing Techniques: Verbal cues, Visual cues, Gestural cues  Exercises Other Exercises Other Exercises: static standing at board with pt able to point out indicated objects with min cues    General Comments        Pertinent Vitals/Pain Pain Assessment Pain Assessment: Faces Faces Pain Scale: No hurt     PT Goals (current goals can now be found in the care plan section) Acute Rehab PT Goals Patient Stated Goal: to go home PT Goal Formulation: With patient Time For Goal Achievement: 02/24/24 Progress towards PT goals: Progressing toward goals    Frequency    Min 2X/week       AM-PAC PT 6 Clicks Mobility   Outcome Measure  Help needed turning from your back to your side while in a flat bed without using bedrails?: A Little Help needed moving from lying on your back to sitting on the side of a flat bed without using bedrails?: A Little Help needed moving to and from a bed to a chair (including a wheelchair)?: A Little Help needed standing up from a chair using your arms (e.g., wheelchair or bedside chair)?: A Little Help needed to walk in hospital room?: A Little Help needed climbing 3-5 steps with a railing? : A Little 6 Click Score: 18    End of Session Equipment Utilized During  Treatment: Gait belt Activity Tolerance: Patient tolerated treatment well Patient left: in bed;with call bell/phone within reach;with bed alarm set Nurse Communication: Mobility status PT Visit Diagnosis: Unsteadiness on feet (R26.81);Difficulty in walking, not elsewhere classified (R26.2);Other abnormalities of gait and mobility (R26.89);Other symptoms and signs involving the nervous system (R29.898);Muscle weakness (generalized) (M62.81)     Time: 8489-8455 PT Time Calculation (min) (ACUTE ONLY): 34 min  Charges:    $Gait Training: 23-37 mins PT General Charges $$ ACUTE PT VISIT: 1 Visit                    Darryle George, PTA Acute Rehabilitation Services Secure Chat Preferred  [phone]    Darryle George 03/02/2024, 4:08 PM

## 2024-03-02 NOTE — Progress Notes (Signed)
 Progress Note   Patient: Amanda [name] FMW:[id number] DOB: 07/01/1960 DOA: 01/03/2024     59 DOS: the patient was seen and examined on 03/02/2024   Brief hospital course: Amanda [name] is 63 year old female with history of hypertension, cocaine abuse, diabetes type 2 who presented with lethargy, near syncope.  Found to be severely hypertensive, UDS positive for cocaine, with acute intraparenchymal hemorrhage , intraventricular hemorrhage and hydrocephalus.  Patient was admitted to ICU for close blood pressure management.  Neurosurgery consulted.  Status post right frontal ventriculostomy. EVD eventually removed on 10/3. Hospital course remarkable for dysphagia. Dietitian/speech therapy recommending PEG placement. PT/OT recommending SNF on discharge.  S/o PEG placement by IR on 10/16.  Tube feeds stopped 02/18/24, currently on supplements, plan to remove PEG 03/19/24 if oral intake better. On and off agitated and did require restraints. TOC following, placement challenging.  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 Continues to receive PT. Plan for discharge to SNF.  EVD removed 01/14/2024, scalp staples removed 02/01/2024.   No reports of agitations recently Still pending bed offer per TOC. Challenging placement.   Hypertension:  Continue carvedilol  25 mg twice daily, Entresto .   Amlodipine  discontinued due to history of systolic CHF and blood pressure now optimal.   Dysphagia:  Moderate malnutrition- S/P PEG placement on 10/16. Currently on dysphagia 3 diet and eating better, meeting all the nutritional demands, dietitian on board, tube feeds stopped 02/18/2024.  Continue protein supplements.  Plan to remove PEG tube around 03/19/2024 if continues to meet her nutritional demands.   Chronic combined systolic/diastolic CHF:  continue Entresto  and spironolactone .  Not on  aspirin  due to recent hemorrhage.    Type 2 diabetes: Recent A1c of 6.6. Glucose controlled on sliding scale insulin .   Cocaine abuse: Consulted TOC for cocaine cessation resources.  S/p fall- Continue delirium precautions. Restraints for safety prn. Fall precautions.     Out of bed to chair. Incentive spirometry. Nursing supportive care. Fall, aspiration precautions. Diet:  Diet Orders (From admission, onward)     Start     Ordered   02/29/24 1041  DIET DYS 3 Room service appropriate? Yes with Assist; Fluid consistency: Thin  Diet effective now       Question Answer Comment  Room service appropriate? Yes with Assist   Fluid consistency: Thin      02/29/24 1041           DVT prophylaxis: enoxaparin  (LOVENOX ) injection 40 mg Start: 02/21/24 1430 Place and maintain sequential compression device Start: 01/03/24 0611 SCDs Start: 01/03/24 0429  Level of care: Progressive   Code Status: Full Code  Subjective: Patient is seen and examined today morning. Appears calm and comfortable. Follows commands  Physical Exam: Vitals:   03/01/24 2315 03/02/24 0330 03/02/24 0803 03/02/24 0821  BP: 118/69 123/83 133/85 133/85  Pulse: 69 66 74 74  Resp: 18 18 18    Temp: 98 F (36.7 C) 97.8 F (36.6 C) 98.8 F (37.1 C)   TempSrc: Oral  Oral   SpO2: 99% 98% 95%   Weight:      Height:        General - Elderly African American female, no apparent distress HEENT - PERRLA, EOMI, atraumatic head, non tender sinuses. Lung - Clear, no rales, rhonchi, wheezes. Heart - S1, S2 heard, no murmurs, rubs, no pedal edema. Abdomen - Soft, non tender, bowel sounds good, PEG intact Neuro - Alert,  awake and calm, strength symmetric and full in both upper and lower extremities Skin - Warm and dry.  Data Reviewed:      Latest Ref Rng & Units 01/29/2024    6:33 AM 01/24/2024    1:37 AM 01/15/2024    6:23 AM  CBC  WBC 4.0 - 10.5 K/uL 7.2  7.4  8.9   Hemoglobin 12.0 - 15.0 g/dL 86.6  87.2   86.8   Hematocrit 36.0 - 46.0 % 40.1  39.4  39.7   Platelets 150 - 400 K/uL 284  444  408       Latest Ref Rng & Units 01/29/2024    6:33 AM 01/24/2024    1:37 AM 01/21/2024    6:03 AM  BMP  Glucose 70 - 99 mg/dL 826  862  847   BUN 8 - 23 mg/dL 36  29  33   Creatinine 0.44 - 1.00 mg/dL 9.13  9.28  9.16   Sodium 135 - 145 mmol/L 135  141  139   Potassium 3.5 - 5.1 mmol/L 4.0  4.2  4.2   Chloride 98 - 111 mmol/L 99  106  104   CO2 22 - 32 mmol/L 24  24  20    Calcium 8.9 - 10.3 mg/dL 9.9  9.2  9.3    No results found.  Family Communication: no family at bedside.  Disposition: Status is: Inpatient Remains inpatient appropriate because: pending safe discharge plan  Planned Discharge Destination: Skilled nursing facility   Author: Derryl Duval, MD 03/02/2024 1:28 PM Secure chat 7am to 7pm For on call review [email or web address]

## 2024-03-02 NOTE — Progress Notes (Signed)
 Speech Language Pathology Treatment: Cognitive-Linguistic  Patient Details Name: Amanda [name] MRN: [id number] DOB: [birth year] Today's Date: 03/02/2024 Time: 8850-8787 SLP Time Calculation (min) (ACUTE ONLY): 23 min  Assessment / Plan / Recommendation Clinical Impression  Patient was alert and awake. SLP brought calendar activity to work on attention and left neglect. Patient was oriented X 4. SLP asked patient varying questions about dates on the calendar, looking at days of the week and different months. Patient answered simple questions regarding finding dates and identifying the days of the week. Patient experienced difficulty when asked questions such as, what date is one week from today? or what was the date last Wednesday? Patient talked in detail about Thanksgiving and her family. Patient shared family members birthdays and where they fall on the calendar. Patient identified dates on both the left and right side of the calendar independently. Patient did not require cues or prompts to look towards the left side. Patient was engaged throughout the activity and shared with SLP, this is sharpening my brain. Patient asked what other activities were planned as SLP was leaving. SLP will continue to follow for cognitive treatment.    HPI HPI: [name] with polysubstance/cocaine abuse 3 hrs before presentation, who called EMS for AMS, lethargy and near syncope. Her condition deteriorated (unable to protect her airway and more lethargic) and got intubated. 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 S/o PEG placement by IR on 10/16.  Tube feeds stopped 02/18/24, currently on supplements, plan to remove PEG 03/19/24 if oral intake better. HTN, CHF, DM-2.      SLP Plan  Continue with current plan of  care          Recommendations                     Oral care BID   Frequent or constant Supervision/Assistance Cognitive communication deficit (R41.841)     Continue with current plan of care    Damien Hy  Graduate SLP Clinican

## 2024-03-03 DIAGNOSIS — I610 Nontraumatic intracerebral hemorrhage in hemisphere, subcortical: Secondary | ICD-10-CM | POA: Diagnosis not present

## 2024-03-03 NOTE — Progress Notes (Signed)
 OT Cancellation Note  Patient Details Name: DEMETRICA ZIPP MRN: [id number] DOB: [birth year]   Cancelled Treatment:    Reason Eval/Treat Not Completed: Pain limiting ability to participate (Patient stating back pain and asked for OT to try after she gets her pain meds.  OT to reattempt as schedule permits)  Jeb LITTIE Laine 03/03/2024, 8:10 AM  Dick Laine, OTA Acute Rehabilitation Services  Office [phone]

## 2024-03-03 NOTE — Plan of Care (Signed)
  Problem: Nutritional: Goal: Maintenance of adequate nutrition will improve Outcome: Progressing   Problem: Skin Integrity: Goal: Risk for impaired skin integrity will decrease Outcome: Progressing   Problem: Activity: Goal: Risk for activity intolerance will decrease Outcome: Progressing   Problem: Coping: Goal: Level of anxiety will decrease Outcome: Progressing

## 2024-03-03 NOTE — TOC Progression Note (Signed)
 Transition of Care [facility]) - Progression Note    Patient Details  Name: BLINDA TUREK MRN: [id number] Date of Birth: 1960/12/15  [facility] CM/SW Contact  Almarie CHRISTELLA Goodie, [place] Phone Number: 03/03/2024, 10:56 AM  Clinical Narrative:   CSW following for disposition. Patient now out of restraints again. No bed offers. CSW to follow.    Expected Discharge Plan: Skilled Nursing Facility Barriers to Discharge: English As A Second Language Teacher, Continued Medical Work up, Inadequate or no insurance, Facility will not accept until restraint criteria met, SNF Pending bed offer               Expected Discharge Plan and Services In-house Referral: Clinical Social Work   Post Acute Care Choice: Skilled Nursing Facility Living arrangements for the past 2 months: Single Family Home                                       Social Drivers of Health (SDOH) Interventions SDOH Screenings   Food Insecurity: No Food Insecurity (07/16/2023)   Received from [facility]  Housing: Low Risk  (07/16/2023)   Received from [facility]  Transportation Needs: No Transportation Needs (07/16/2023)   Received from [facility]  Utilities: Not At Risk (07/16/2023)   Received from [facility]  Alcohol Screen: Low Risk  (05/14/2023)  Financial Resource Strain: Low Risk  (07/16/2023)   Received from [facility]  Tobacco Use: High Risk (01/26/2024)    Readmission Risk Interventions    03/26/2022   10:50 AM  Readmission Risk Prevention Plan  Transportation Screening Complete  HRI or Home Care Consult Complete  Social Work Consult for Recovery Care Planning/Counseling Complete  Palliative Care Screening Not Applicable  Medication Review Oceanographer) Referral to Pharmacy

## 2024-03-03 NOTE — Progress Notes (Signed)
 Occupational Therapy Treatment Patient Details Name: Amanda [name] MRN: [id number] DOB: 03-21-1961 Today's Date: 03/03/2024   History of present illness 63 yo female [facility] with lethargy and near-syncope s/p cocaine and opioid ingestion. 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 S/p R frontal ventriculostomy, burr holes 9/22. ETT 9/22-9/23. PMHx of cocaine abuse, CHF, CKD, chronic cough, HTN and DM2.   OT comments  Patient demonstrating good gains with OT treatment with bed mobility, transfers, and self care. Patient demonstrates improvement with command following and posture with mobility.  Patient continues to require cues for safety due to impulsiveness.  Patient will benefit from continued inpatient follow up therapy, <3 hours/day.  Acute OT to continue to follow to address established goals to facilitate DC to next venue of care.        If plan is discharge home, recommend the following:  A lot of help with bathing/dressing/bathroom;Assistance with cooking/housework;Help with stairs or ramp for entrance;Direct supervision/assist for financial management;Direct supervision/assist for medications management;Assist for transportation;Supervision due to cognitive status;A little help with walking and/or transfers   Equipment Recommendations  Other (comment) (defer)    Recommendations for Other Services      Precautions / Restrictions Precautions Precautions: Fall;Other (comment) Recall of Precautions/Restrictions: Impaired Precaution/Restrictions Comments: PEG, SBP < 160, impulsive Restrictions Weight Bearing Restrictions Per Provider Order: No       Mobility Bed Mobility Overal bed mobility: Needs Assistance Bed Mobility: Supine to Sit, Sit to Supine     Supine to sit: Min assist, HOB elevated Sit to supine: Min assist   General bed mobility comments: assistance  with trunk to get to EOB and assistance for BLE to return to supine    Transfers Overall transfer level: Needs assistance Equipment used: Rolling walker (2 wheels) Transfers: Sit to/from Stand Sit to Stand: Min assist           General transfer comment: min assist and cues for hand placement     Balance Overall balance assessment: Needs assistance Sitting-balance support: No upper extremity supported, Feet supported, Single extremity supported Sitting balance-Leahy Scale: Fair Sitting balance - Comments: supervision on EOB   Standing balance support: Reliant on assistive device for balance, Bilateral upper extremity supported, During functional activity Standing balance-Leahy Scale: Poor Standing balance comment: reliant on RW for support and safety when standing                           ADL either performed or assessed with clinical judgement   ADL Overall ADL's : Needs assistance/impaired     Grooming: Wash/dry hands;Contact guard assist;Standing           Upper Body Dressing : Supervision/safety;Sitting Upper Body Dressing Details (indicate cue type and reason): gown for back Lower Body Dressing: Moderate assistance;Sitting/lateral leans Lower Body Dressing Details (indicate cue type and reason): socks                    Extremity/Trunk Assessment              Vision       Perception     Praxis     Communication Communication Communication: No apparent difficulties   Cognition Arousal: Alert Behavior During Therapy: Impulsive Cognition: Cognition impaired   Orientation impairments: Situation, Time Awareness: Intellectual awareness impaired Memory impairment (select all impairments): Short-term memory, Working memory Attention impairment (select first level of impairment):  Sustained attention Executive functioning impairment (select all impairments): Initiation, Sequencing, Reasoning, Problem solving OT - Cognition Comments:  cues for safety due to impulsive                 Following commands: Impaired Following commands impaired: Follows one step commands with increased time, Follows one step commands inconsistently      Cueing   Cueing Techniques: Verbal cues, Visual cues, Gestural cues  Exercises      Shoulder Instructions       General Comments      Pertinent Vitals/ Pain       Pain Assessment Pain Assessment: No/denies pain Pain Intervention(s): Monitored during session  Home Living                                          Prior Functioning/Environment              Frequency  Min 1X/week        Progress Toward Goals  OT Goals(current goals can now be found in the care plan section)  Progress towards OT goals: Progressing toward goals  Acute Rehab OT Goals Patient Stated Goal: did not state OT Goal Formulation: With patient Time For Goal Achievement: 03/07/24 Potential to Achieve Goals: Fair ADL Goals Pt Will Perform Eating: with set-up;sitting Pt Will Perform Grooming: with contact guard assist;standing Pt Will Transfer to Toilet: with contact guard assist;ambulating Additional ADL Goal #1: Pt will attend to items on L side and properly orient self to sink/task at hand 50% of the time, in preparation for ADLs performed in standing. Additional ADL Goal #2: Pt will follow 1-step commands 100% of the time during ADLs and functional mobility to demonstrate improved attention.  Plan      Co-evaluation                 AM-PAC OT 6 Clicks Daily Activity     Outcome Measure   Help from another person eating meals?: A Little Help from another person taking care of personal grooming?: A Little Help from another person toileting, which includes using toliet, bedpan, or urinal?: A Lot Help from another person bathing (including washing, rinsing, drying)?: A Lot Help from another person to put on and taking off regular upper body clothing?: A  Little Help from another person to put on and taking off regular lower body clothing?: A Lot 6 Click Score: 15    End of Session Equipment Utilized During Treatment: Gait belt;Rolling walker (2 wheels)  OT Visit Diagnosis: Unsteadiness on feet (R26.81);Other abnormalities of gait and mobility (R26.89);Muscle weakness (generalized) (M62.81)   Activity Tolerance Patient tolerated treatment well   Patient Left in bed;with call bell/phone within reach;with bed alarm set   Nurse Communication Mobility status        Time: 1050-1109 OT Time Calculation (min): 19 min  Charges: OT General Charges $OT Visit: 1 Visit OT Treatments $Self Care/Home Management : 8-22 mins  Dick Laine, OTA Acute Rehabilitation Services  Office [phone]   Jeb LITTIE Laine 03/03/2024, 1:49 PM

## 2024-03-03 NOTE — Progress Notes (Signed)
 Progress Note   Patient: Amanda [name] FMW:[id number] DOB: 1960/10/21 DOA: 01/03/2024     60 DOS: the patient was seen and examined on 03/03/2024   Brief hospital course: Amanda [name] is 63 year old female with history of hypertension, cocaine abuse, diabetes type 2 who presented with lethargy, near syncope.  Found to be severely hypertensive, UDS positive for cocaine, with acute intraparenchymal hemorrhage , intraventricular hemorrhage and hydrocephalus.  Patient was admitted to ICU for close blood pressure management.  Neurosurgery consulted.  Status post right frontal ventriculostomy. EVD eventually removed on 10/3. Hospital course remarkable for dysphagia. Dietitian/speech therapy recommending PEG placement. PT/OT recommending SNF on discharge.  S/o PEG placement by IR on 10/16.  Tube feeds stopped 02/18/24, currently on supplements, plan to remove PEG 03/19/24 if oral intake better. On and off agitated and did require restraints. TOC following, placement challenging.  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 Continues to receive PT. Plan for discharge to SNF.  EVD removed 01/14/2024, scalp staples removed 02/01/2024.   No reports of agitations recently Still pending bed offer per TOC. Challenging placement.   Hypertension:  Continue carvedilol  25 mg twice daily, Entresto .   Amlodipine  discontinued due to history of systolic CHF and blood pressure now optimal.   Dysphagia:  Moderate malnutrition- S/P PEG placement on 10/16. Currently on dysphagia 3 diet and eating better, meeting all the nutritional demands, dietitian on board, tube feeds stopped 02/18/2024.  Continue protein supplements.  Plan to remove PEG tube around 03/19/2024 if continues to meet her nutritional demands.   Chronic combined systolic/diastolic CHF:  continue Entresto  and spironolactone .  Not on  aspirin  due to recent hemorrhage.    Type 2 diabetes: Recent A1c of 6.6. Glucose controlled on sliding scale insulin .   Cocaine abuse: Consulted TOC for cocaine cessation resources.  S/p fall- Continue delirium precautions. Restraints for safety prn. Fall precautions.     Out of bed to chair. Incentive spirometry. Nursing supportive care. Fall, aspiration precautions. Diet:  Diet Orders (From admission, onward)     Start     Ordered   02/29/24 1041  DIET DYS 3 Room service appropriate? Yes with Assist; Fluid consistency: Thin  Diet effective now       Question Answer Comment  Room service appropriate? Yes with Assist   Fluid consistency: Thin      02/29/24 1041           DVT prophylaxis: enoxaparin  (LOVENOX ) injection 40 mg Start: 02/21/24 1430 Place and maintain sequential compression device Start: 01/03/24 0611 SCDs Start: 01/03/24 0429  Level of care: Progressive   Code Status: Full Code  Subjective: Patient is seen and examined today morning. Appears calm and comfortable. Follows commands  Physical Exam: Vitals:   03/02/24 2333 03/03/24 0333 03/03/24 0749 03/03/24 1137  BP: 121/76 (!) 149/93 (!) 154/89 127/82  Pulse: 68 80 83 83  Resp: 18 18 18 18   Temp: 98.1 F (36.7 C) 98.1 F (36.7 C) (!) 97.4 F (36.3 C) 98.3 F (36.8 C)  TempSrc:   Oral Oral  SpO2: 99% 98% 99% 100%  Weight:      Height:        General - Elderly African American female, no apparent distress HEENT - PERRLA, EOMI, atraumatic head, non tender sinuses. Lung - Clear, no rales, rhonchi, wheezes. Heart - S1, S2 heard, no murmurs, rubs, no pedal edema. Abdomen - Soft, non tender, bowel sounds  good, PEG intact Neuro - Alert, awake and calm, strength symmetric and full in both upper and lower extremities Skin - Warm and dry.  Data Reviewed:      Latest Ref Rng & Units 01/29/2024    6:33 AM 01/24/2024    1:37 AM 01/15/2024    6:23 AM  CBC  WBC 4.0 - 10.5 K/uL 7.2  7.4  8.9    Hemoglobin 12.0 - 15.0 g/dL 86.6  87.2  86.8   Hematocrit 36.0 - 46.0 % 40.1  39.4  39.7   Platelets 150 - 400 K/uL 284  444  408       Latest Ref Rng & Units 01/29/2024    6:33 AM 01/24/2024    1:37 AM 01/21/2024    6:03 AM  BMP  Glucose 70 - 99 mg/dL 826  862  847   BUN 8 - 23 mg/dL 36  29  33   Creatinine 0.44 - 1.00 mg/dL 9.13  9.28  9.16   Sodium 135 - 145 mmol/L 135  141  139   Potassium 3.5 - 5.1 mmol/L 4.0  4.2  4.2   Chloride 98 - 111 mmol/L 99  106  104   CO2 22 - 32 mmol/L 24  24  20    Calcium 8.9 - 10.3 mg/dL 9.9  9.2  9.3    No results found.  Family Communication: no family at bedside.  Disposition: Status is: Inpatient Remains inpatient appropriate because: pending safe discharge plan  Planned Discharge Destination: Skilled nursing facility   Author: Derryl Duval, MD 03/03/2024 3:05 PM Secure chat 7am to 7pm For on call review [email or web address]

## 2024-03-04 DIAGNOSIS — I610 Nontraumatic intracerebral hemorrhage in hemisphere, subcortical: Secondary | ICD-10-CM | POA: Diagnosis not present

## 2024-03-04 DIAGNOSIS — F1011 Alcohol abuse, in remission: Secondary | ICD-10-CM

## 2024-03-04 DIAGNOSIS — I5042 Chronic combined systolic (congestive) and diastolic (congestive) heart failure: Secondary | ICD-10-CM | POA: Diagnosis not present

## 2024-03-04 DIAGNOSIS — F1410 Cocaine abuse, uncomplicated: Secondary | ICD-10-CM | POA: Diagnosis not present

## 2024-03-04 DIAGNOSIS — I619 Nontraumatic intracerebral hemorrhage, unspecified: Secondary | ICD-10-CM | POA: Diagnosis not present

## 2024-03-04 NOTE — Plan of Care (Signed)
  Problem: Education: Goal: Ability to describe self-care measures that may prevent or decrease complications (Diabetes Survival Skills Education) will improve Outcome: Progressing   Problem: Coping: Goal: Ability to adjust to condition or change in health will improve Outcome: Progressing   Problem: Fluid Volume: Goal: Ability to maintain a balanced intake and output will improve Outcome: Progressing   Problem: Health Behavior/Discharge Planning: Goal: Ability to identify and utilize available resources and services will improve Outcome: Progressing Goal: Ability to manage health-related needs will improve Outcome: Progressing   Problem: Metabolic: Goal: Ability to maintain appropriate glucose levels will improve Outcome: Progressing   Problem: Nutritional: Goal: Maintenance of adequate nutrition will improve Outcome: Progressing Goal: Progress toward achieving an optimal weight will improve Outcome: Progressing   Problem: Skin Integrity: Goal: Risk for impaired skin integrity will decrease Outcome: Progressing   Problem: Tissue Perfusion: Goal: Adequacy of tissue perfusion will improve Outcome: Progressing   Problem: Education: Goal: Knowledge of General Education information will improve Description: Including pain rating scale, medication(s)/side effects and non-pharmacologic comfort measures Outcome: Progressing   Problem: Health Behavior/Discharge Planning: Goal: Ability to manage health-related needs will improve Outcome: Progressing   Problem: Clinical Measurements: Goal: Ability to maintain clinical measurements within normal limits will improve Outcome: Progressing Goal: Will remain free from infection Outcome: Progressing Goal: Diagnostic test results will improve Outcome: Progressing Goal: Respiratory complications will improve Outcome: Progressing Goal: Cardiovascular complication will be avoided Outcome: Progressing   Problem: Activity: Goal:  Risk for activity intolerance will decrease Outcome: Progressing   Problem: Nutrition: Goal: Adequate nutrition will be maintained Outcome: Progressing   Problem: Coping: Goal: Level of anxiety will decrease Outcome: Progressing   Problem: Elimination: Goal: Will not experience complications related to bowel motility Outcome: Progressing Goal: Will not experience complications related to urinary retention Outcome: Progressing   Problem: Pain Managment: Goal: General experience of comfort will improve and/or be controlled Outcome: Progressing   Problem: Safety: Goal: Ability to remain free from injury will improve Outcome: Progressing   Problem: Skin Integrity: Goal: Risk for impaired skin integrity will decrease Outcome: Progressing   Problem: Education: Goal: Knowledge of disease or condition will improve Outcome: Progressing Goal: Knowledge of secondary prevention will improve (MUST DOCUMENT ALL) Outcome: Progressing Goal: Knowledge of patient specific risk factors will improve (DELETE if not current risk factor) Outcome: Progressing   Problem: Intracerebral Hemorrhage Tissue Perfusion: Goal: Complications of Intracerebral Hemorrhage will be minimized Outcome: Progressing   Problem: Coping: Goal: Will verbalize positive feelings about self Outcome: Progressing Goal: Will identify appropriate support needs Outcome: Progressing   Problem: Health Behavior/Discharge Planning: Goal: Ability to manage health-related needs will improve Outcome: Progressing Goal: Goals will be collaboratively established with patient/family Outcome: Progressing   Problem: Self-Care: Goal: Ability to participate in self-care as condition permits will improve Outcome: Progressing Goal: Verbalization of feelings and concerns over difficulty with self-care will improve Outcome: Progressing Goal: Ability to communicate needs accurately will improve Outcome: Progressing   Problem:  Nutrition: Goal: Risk of aspiration will decrease Outcome: Progressing Goal: Dietary intake will improve Outcome: Progressing

## 2024-03-04 NOTE — Progress Notes (Signed)
 Progress Note   Patient: Amanda [name] FMW:[id number] DOB: 1961-01-08 DOA: 01/03/2024     61 DOS: the patient was seen and examined on 03/04/2024   Brief hospital course: Amanda [name] is 63 year old female with history of hypertension, cocaine abuse, diabetes type 2 who presented with lethargy, near syncope.  Found to be severely hypertensive, UDS positive for cocaine, with acute intraparenchymal hemorrhage , intraventricular hemorrhage and hydrocephalus.  Patient was admitted to ICU for close blood pressure management.  Neurosurgery consulted.  Status post right frontal ventriculostomy. EVD eventually removed on 10/3. Hospital course remarkable for dysphagia. Dietitian/speech therapy recommending PEG placement. PT/OT recommending SNF on discharge.  S/o PEG placement by IR on 10/16.  Tube feeds stopped 02/18/24, currently on supplements, plan to remove PEG 03/19/24 if oral intake better. On and off agitated and did require restraints. TOC following, placement challenging.  11/22: Calmer, alert and oriented.  P.o. intake improving. Still pending disposition.  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 Continues to receive PT. Plan for discharge to SNF.  EVD removed 01/14/2024, scalp staples removed 02/01/2024.   No reports of agitations recently Still pending bed offer per TOC. Challenging placement.   Hypertension:  Continue carvedilol  25 mg twice daily, Entresto .   Amlodipine  discontinued due to history of systolic CHF and blood pressure now optimal.   Dysphagia:  Moderate malnutrition- S/P PEG placement on 10/16. Currently on dysphagia 3 diet and eating better, meeting all the nutritional demands, dietitian on board, tube feeds stopped 02/18/2024.  Continue protein supplements.  Plan to remove PEG tube around 03/19/2024 if continues to meet her nutritional demands.    Chronic combined systolic/diastolic CHF:  continue Entresto  and spironolactone .  Not on aspirin  due to recent hemorrhage.    Type 2 diabetes: Recent A1c of 6.6. Glucose controlled on sliding scale insulin .   Cocaine abuse: Consulted TOC for cocaine cessation resources.  S/p fall- Continue delirium precautions. Restraints for safety prn. Fall precautions.     Out of bed to chair. Incentive spirometry. Nursing supportive care. Fall, aspiration precautions. Diet:  Diet Orders (From admission, onward)     Start     Ordered   02/29/24 1041  DIET DYS 3 Room service appropriate? Yes with Assist; Fluid consistency: Thin  Diet effective now       Question Answer Comment  Room service appropriate? Yes with Assist   Fluid consistency: Thin      02/29/24 1041           DVT prophylaxis: enoxaparin  (LOVENOX ) injection 40 mg Start: 02/21/24 1430 Place and maintain sequential compression device Start: 01/03/24 0611 SCDs Start: 01/03/24 0429  Level of care: Progressive   Code Status: Full Code  Subjective: Patient was seen and examined today.  No sign of agitation and answering questions appropriately.  Physical Exam: Vitals:   03/03/24 1518 03/03/24 2123 03/04/24 0800 03/04/24 1103  BP: (!) 142/86 125/86 (!) 152/87 (!) 120/90  Pulse: 92 81 68 75  Resp: 19 18 17 15   Temp: 98.4 F (36.9 C) 99.5 F (37.5 C) 98.5 F (36.9 C) (!) 97.4 F (36.3 C)  TempSrc: Oral Oral Oral Oral  SpO2: 100% 100% 95% 97%  Weight:      Height:       General.  Malnourished lady, in no acute distress. Pulmonary.  Lungs clear bilaterally, normal respiratory effort. CV.  Regular rate and rhythm, no JVD, rub or murmur. Abdomen.  Soft, nontender, nondistended, BS positive. CNS.  Alert and oriented .  No focal neurologic deficit. Extremities.  No edema, pulses intact and symmetrical.  Data Reviewed:      Latest Ref Rng & Units 01/29/2024    6:33 AM 01/24/2024    1:37 AM 01/15/2024    6:23 AM   CBC  WBC 4.0 - 10.5 K/uL 7.2  7.4  8.9   Hemoglobin 12.0 - 15.0 g/dL 86.6  87.2  86.8   Hematocrit 36.0 - 46.0 % 40.1  39.4  39.7   Platelets 150 - 400 K/uL 284  444  408       Latest Ref Rng & Units 01/29/2024    6:33 AM 01/24/2024    1:37 AM 01/21/2024    6:03 AM  BMP  Glucose 70 - 99 mg/dL 826  862  847   BUN 8 - 23 mg/dL 36  29  33   Creatinine 0.44 - 1.00 mg/dL 9.13  9.28  9.16   Sodium 135 - 145 mmol/L 135  141  139   Potassium 3.5 - 5.1 mmol/L 4.0  4.2  4.2   Chloride 98 - 111 mmol/L 99  106  104   CO2 22 - 32 mmol/L 24  24  20    Calcium 8.9 - 10.3 mg/dL 9.9  9.2  9.3    No results found.  Family Communication: Discussed with patient  Disposition: Status is: Inpatient Remains inpatient appropriate because: pending safe discharge plan  Planned Discharge Destination: Skilled nursing facility   Author: Amaryllis Dare, MD 03/04/2024 1:50 PM Secure chat 7am to 7pm For on call review [email or web address]

## 2024-03-05 DIAGNOSIS — I610 Nontraumatic intracerebral hemorrhage in hemisphere, subcortical: Secondary | ICD-10-CM | POA: Diagnosis not present

## 2024-03-05 DIAGNOSIS — F1410 Cocaine abuse, uncomplicated: Secondary | ICD-10-CM | POA: Diagnosis not present

## 2024-03-05 DIAGNOSIS — I619 Nontraumatic intracerebral hemorrhage, unspecified: Secondary | ICD-10-CM | POA: Diagnosis not present

## 2024-03-05 DIAGNOSIS — I5042 Chronic combined systolic (congestive) and diastolic (congestive) heart failure: Secondary | ICD-10-CM | POA: Diagnosis not present

## 2024-03-05 NOTE — Plan of Care (Signed)
  Problem: Nutritional: Goal: Maintenance of adequate nutrition will improve Outcome: Progressing   Problem: Skin Integrity: Goal: Risk for impaired skin integrity will decrease Outcome: Progressing   Problem: Activity: Goal: Risk for activity intolerance will decrease Outcome: Progressing

## 2024-03-05 NOTE — Plan of Care (Signed)
  Problem: Coping: Goal: Ability to adjust to condition or change in health will improve Outcome: Progressing   Problem: Skin Integrity: Goal: Risk for impaired skin integrity will decrease Outcome: Progressing   Problem: Nutritional: Goal: Maintenance of adequate nutrition will improve Outcome: Progressing Goal: Progress toward achieving an optimal weight will improve Outcome: Progressing

## 2024-03-05 NOTE — Progress Notes (Signed)
 Progress Note   Patient: Amanda [name] FMW:[id number] DOB: 09-07-1960 DOA: 01/03/2024     63 DOS: the patient was seen and examined on 03/05/2024   Brief hospital course: Loyce Flaming is 63 year old female with history of hypertension, cocaine abuse, diabetes type 2 who presented with lethargy, near syncope.  Found to be severely hypertensive, UDS positive for cocaine, with acute intraparenchymal hemorrhage , intraventricular hemorrhage and hydrocephalus.  Patient was admitted to ICU for close blood pressure management.  Neurosurgery consulted.  Status post right frontal ventriculostomy. EVD eventually removed on 10/3. Hospital course remarkable for dysphagia. Dietitian/speech therapy recommending PEG placement. PT/OT recommending SNF on discharge.  S/o PEG placement by IR on 10/16.  Tube feeds stopped 02/18/24, currently on supplements, plan to remove PEG 03/19/24 if oral intake better. On and off agitated and did require restraints. TOC following, placement challenging.  11/22: Calmer, alert and oriented.  P.o. intake improving. Still pending disposition.  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 Continues to receive PT. Plan for discharge to SNF.  EVD removed 01/14/2024, scalp staples removed 02/01/2024.   No reports of agitations recently Still pending bed offer per TOC. Challenging placement.   Hypertension:  Continue carvedilol  25 mg twice daily, Entresto .   Amlodipine  discontinued due to history of systolic CHF and blood pressure now optimal.   Dysphagia:  Moderate malnutrition- S/P PEG placement on 10/16. Currently on dysphagia 3 diet and eating better, meeting all the nutritional demands, dietitian on board, tube feeds stopped 02/18/2024.  Continue protein supplements.  Plan to remove PEG tube around 03/19/2024 if continues to meet her nutritional demands.    Chronic combined systolic/diastolic CHF:  continue Entresto  and spironolactone .  Not on aspirin  due to recent hemorrhage.    Type 2 diabetes: Recent A1c of 6.6. Glucose controlled on sliding scale insulin .   Cocaine abuse: Consulted TOC for cocaine cessation resources.  S/p fall- Continue delirium precautions. Restraints for safety prn. Fall precautions.     Out of bed to chair. Incentive spirometry. Nursing supportive care. Fall, aspiration precautions. Diet:  Diet Orders (From admission, onward)     Start     Ordered   02/29/24 1041  DIET DYS 3 Room service appropriate? Yes with Assist; Fluid consistency: Thin  Diet effective now       Question Answer Comment  Room service appropriate? Yes with Assist   Fluid consistency: Thin      02/29/24 1041           DVT prophylaxis: enoxaparin  (LOVENOX ) injection 40 mg Start: 02/21/24 1430 Place and maintain sequential compression device Start: 01/03/24 0611 SCDs Start: 01/03/24 0429  Level of care: Progressive   Code Status: Full Code  Subjective: Patient was seen and examined today.  Appears calm and pleasant.  No new concern  Physical Exam: Vitals:   03/05/24 0026 03/05/24 0515 03/05/24 0859 03/05/24 1119  BP: 136/88 (!) 131/98 135/88 106/68  Pulse: 86 79 84 78  Resp: 17 17 18 17   Temp: 98.4 F (36.9 C) 98.5 F (36.9 C) 98.5 F (36.9 C) 98.3 F (36.8 C)  TempSrc: Oral  Oral Oral  SpO2: 96% 100% 100% 97%  Weight:      Height:       General.  Malnourished lady, in no acute distress. Pulmonary.  Lungs clear bilaterally, normal respiratory effort. CV.  Regular rate and rhythm, no JVD, rub or murmur. Abdomen.  Soft, nontender,  nondistended, BS positive. CNS.  Alert and oriented .  No focal neurologic deficit. Extremities.  No edema, no cyanosis, pulses intact and symmetrical.   Data Reviewed:      Latest Ref Rng & Units 01/29/2024    6:33 AM 01/24/2024    1:37 AM 01/15/2024    6:23 AM  CBC  WBC 4.0 -  10.5 K/uL 7.2  7.4  8.9   Hemoglobin 12.0 - 15.0 g/dL 86.6  87.2  86.8   Hematocrit 36.0 - 46.0 % 40.1  39.4  39.7   Platelets 150 - 400 K/uL 284  444  408       Latest Ref Rng & Units 01/29/2024    6:33 AM 01/24/2024    1:37 AM 01/21/2024    6:03 AM  BMP  Glucose 70 - 99 mg/dL 826  862  847   BUN 8 - 23 mg/dL 36  29  33   Creatinine 0.44 - 1.00 mg/dL 9.13  9.28  9.16   Sodium 135 - 145 mmol/L 135  141  139   Potassium 3.5 - 5.1 mmol/L 4.0  4.2  4.2   Chloride 98 - 111 mmol/L 99  106  104   CO2 22 - 32 mmol/L 24  24  20    Calcium 8.9 - 10.3 mg/dL 9.9  9.2  9.3    No results found.  Family Communication: Discussed with patient  Disposition: Status is: Inpatient Remains inpatient appropriate because: pending safe discharge plan  Planned Discharge Destination: Skilled nursing facility   Author: Amaryllis Dare, MD 03/05/2024 1:26 PM Secure chat 7am to 7pm For on call review [email or web address].

## 2024-03-06 DIAGNOSIS — I619 Nontraumatic intracerebral hemorrhage, unspecified: Secondary | ICD-10-CM | POA: Diagnosis not present

## 2024-03-06 DIAGNOSIS — F1410 Cocaine abuse, uncomplicated: Secondary | ICD-10-CM | POA: Diagnosis not present

## 2024-03-06 DIAGNOSIS — I610 Nontraumatic intracerebral hemorrhage in hemisphere, subcortical: Secondary | ICD-10-CM | POA: Diagnosis not present

## 2024-03-06 DIAGNOSIS — I5042 Chronic combined systolic (congestive) and diastolic (congestive) heart failure: Secondary | ICD-10-CM | POA: Diagnosis not present

## 2024-03-06 NOTE — Progress Notes (Signed)
 Physical Therapy Treatment Patient Details Name: Amanda [name] MRN: [id number] DOB: [birth year] Today's Date: 03/06/2024   History of Present Illness 63 yo female presents to [facility] on 9/22 with lethargy and near-syncope s/p cocaine and opioid ingestion. 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 S/p R frontal ventriculostomy, burr holes 9/22. ETT 9/22-9/23. PMHx of cocaine abuse, CHF, CKD, chronic cough, HTN and DM2.    PT Comments  Pt received in supine and agreeable to session. Pt appears more flat this session with improved focus during tasks, however continues to require frequent cues due to poor carryover. Pt occasionally demonstrating increased shuffling requiring cues to correct and min A for stability. Pt able to increase gait distance with 1 seated rest break. Pt continues to benefit from PT services to progress toward functional mobility goals.     If plan is discharge home, recommend the following: Supervision due to cognitive status;Assist for transportation;Help with stairs or ramp for entrance;Assistance with cooking/housework;Direct supervision/assist for medications management;Direct supervision/assist for financial management;A lot of help with walking and/or transfers;A lot of help with bathing/dressing/bathroom   Can travel by private vehicle     No  Equipment Recommendations  Rolling walker (2 wheels);BSC/3in1    Recommendations for Other Services       Precautions / Restrictions Precautions Precautions: Fall;Other (comment) Recall of Precautions/Restrictions: Impaired Precaution/Restrictions Comments: PEG, SBP < 160, impulsive Restrictions Weight Bearing Restrictions Per Provider Order: No     Mobility  Bed Mobility Overal bed mobility: Needs Assistance Bed Mobility: Supine to Sit, Sit to Supine     Supine to sit: Contact guard, HOB elevated Sit to supine: Contact  guard assist   General bed mobility comments: increased time and cues for anterior lean when sitting to EOB    Transfers Overall transfer level: Needs assistance Equipment used: Rolling walker (2 wheels) Transfers: Sit to/from Stand Sit to Stand: Min assist           General transfer comment: light min A and cues for hand placement, but pt continuing to place hands on RW    Ambulation/Gait Ambulation/Gait assistance: Min assist, Contact guard assist Gait Distance (Feet): 200 Feet Assistive device: Rolling walker (2 wheels) Gait Pattern/deviations: Step-through pattern, Trunk flexed, Decreased stride length, Shuffle Gait velocity: reduced     General Gait Details: Frequent cues for increased foot clearance and step length. Cues and intermittent assist for RW proximity. Pt intermittently demonstrates quick shuffled steps requiring standing rest break to reset   Stairs Stairs: Yes Stairs assistance: Min assist Stair Management: Two rails, Forwards Number of Stairs: 4 General stair comments: min A for balance, especially during descent   Wheelchair Mobility     Tilt Bed    Modified Rankin (Stroke Patients Only) Modified Rankin (Stroke Patients Only) Pre-Morbid Rankin Score: No symptoms Modified Rankin: Moderately severe disability     Balance Overall balance assessment: Needs assistance Sitting-balance support: No upper extremity supported, Feet supported, Single extremity supported Sitting balance-Leahy Scale: Fair Sitting balance - Comments: close supervision on EOB   Standing balance support: Reliant on assistive device for balance, Bilateral upper extremity supported, During functional activity Standing balance-Leahy Scale: Poor Standing balance comment: reliant on RW support                            Communication Communication Communication: No apparent difficulties  Cognition Arousal: Alert Behavior During Therapy: WFL for tasks  assessed/performed,  Flat affect   PT - Cognitive impairments: Awareness, Memory, Attention, Problem solving, Safety/Judgement                         Following commands: Impaired Following commands impaired: Follows one step commands with increased time, Follows one step commands inconsistently    Cueing Cueing Techniques: Verbal cues, Visual cues, Gestural cues  Exercises      General Comments        Pertinent Vitals/Pain Pain Assessment Pain Assessment: No/denies pain     PT Goals (current goals can now be found in the care plan section) Acute Rehab PT Goals Patient Stated Goal: to go home PT Goal Formulation: With patient Time For Goal Achievement: 02/24/24 Progress towards PT goals: Progressing toward goals    Frequency    Min 2X/week       AM-PAC PT 6 Clicks Mobility   Outcome Measure  Help needed turning from your back to your side while in a flat bed without using bedrails?: A Little Help needed moving from lying on your back to sitting on the side of a flat bed without using bedrails?: A Little Help needed moving to and from a bed to a chair (including a wheelchair)?: A Little Help needed standing up from a chair using your arms (e.g., wheelchair or bedside chair)?: A Little Help needed to walk in hospital room?: A Little Help needed climbing 3-5 steps with a railing? : A Little 6 Click Score: 18    End of Session Equipment Utilized During Treatment: Gait belt Activity Tolerance: Patient tolerated treatment well Patient left: in bed;with call bell/phone within reach;with bed alarm set Nurse Communication: Mobility status PT Visit Diagnosis: Unsteadiness on feet (R26.81);Difficulty in walking, not elsewhere classified (R26.2);Other abnormalities of gait and mobility (R26.89);Other symptoms and signs involving the nervous system (R29.898);Muscle weakness (generalized) (M62.81)     Time: 8463-8398 PT Time Calculation (min) (ACUTE ONLY): 25  min  Charges:    $Gait Training: 23-37 mins PT General Charges $$ ACUTE PT VISIT: 1 Visit                    Darryle George, PTA Acute Rehabilitation Services Secure Chat Preferred  [phone]    Darryle George 03/06/2024, 4:11 PM

## 2024-03-06 NOTE — Plan of Care (Signed)
  Problem: Fluid Volume: Goal: Ability to maintain a balanced intake and output will improve Outcome: Progressing   Problem: Coping: Goal: Ability to adjust to condition or change in health will improve Outcome: Progressing   Problem: Nutritional: Goal: Maintenance of adequate nutrition will improve Outcome: Progressing Goal: Progress toward achieving an optimal weight will improve Outcome: Progressing

## 2024-03-06 NOTE — Plan of Care (Signed)
 Ambulated in halls with therapy today.   Problem: Education: Goal: Ability to describe self-care measures that may prevent or decrease complications (Diabetes Survival Skills Education) will improve Outcome: Progressing   Problem: Nutritional: Goal: Maintenance of adequate nutrition will improve Outcome: Progressing   Problem: Activity: Goal: Risk for activity intolerance will decrease Outcome: Progressing   Problem: Safety: Goal: Ability to remain free from injury will improve Outcome: Progressing   Problem: Skin Integrity: Goal: Risk for impaired skin integrity will decrease Outcome: Progressing

## 2024-03-06 NOTE — Progress Notes (Signed)
 Patient refused vitals.

## 2024-03-06 NOTE — Progress Notes (Signed)
 Progress Note   Patient: Amanda [name] FMW:[id number] DOB: 09/29/1960 DOA: 01/03/2024     64 DOS: the patient was seen and examined on 03/06/2024   Brief hospital course: Amanda [name] is 63 year old female with history of hypertension, cocaine abuse, diabetes type 2 who presented with lethargy, near syncope.  Found to be severely hypertensive, UDS positive for cocaine, with acute intraparenchymal hemorrhage , intraventricular hemorrhage and hydrocephalus.  Patient was admitted to ICU for close blood pressure management.  Neurosurgery consulted.  Status post right frontal ventriculostomy. EVD eventually removed on 10/3. Hospital course remarkable for dysphagia. Dietitian/speech therapy recommending PEG placement. PT/OT recommending SNF on discharge.  S/o PEG placement by IR on 10/16.  Tube feeds stopped 02/18/24, currently on supplements, plan to remove PEG 03/19/24 if oral intake better. On and off agitated and did require restraints. TOC following, placement challenging.  11/22: Calmer, alert and oriented.  P.o. intake improving. Still pending disposition. 11/24: Remained calm and pleasant.  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 Continues to receive PT. Plan for discharge to SNF.  EVD removed 01/14/2024, scalp staples removed 02/01/2024.   No reports of agitations recently Still pending bed offer per TOC. Challenging placement.   Hypertension:  Continue carvedilol  25 mg twice daily, Entresto .   Amlodipine  discontinued due to history of systolic CHF and blood pressure now optimal.   Dysphagia:  Moderate malnutrition- S/P PEG placement on 10/16. Currently on dysphagia 3 diet and eating better, meeting all the nutritional demands, dietitian on board, tube feeds stopped 02/18/2024.  Continue protein supplements.  Plan to remove PEG tube around 03/19/2024 if continues  to meet her nutritional demands.   Chronic combined systolic/diastolic CHF:  continue Entresto  and spironolactone .  Not on aspirin  due to recent hemorrhage.    Type 2 diabetes: Recent A1c of 6.6. Glucose controlled on sliding scale insulin .   Cocaine abuse: Consulted TOC for cocaine cessation resources.  S/p fall- Continue delirium precautions. Restraints for safety prn. Fall precautions.     Out of bed to chair. Incentive spirometry. Nursing supportive care. Fall, aspiration precautions. Diet:  Diet Orders (From admission, onward)     Start     Ordered   02/29/24 1041  DIET DYS 3 Room service appropriate? Yes with Assist; Fluid consistency: Thin  Diet effective now       Question Answer Comment  Room service appropriate? Yes with Assist   Fluid consistency: Thin      02/29/24 1041           DVT prophylaxis: enoxaparin  (LOVENOX ) injection 40 mg Start: 02/21/24 1430 Place and maintain sequential compression device Start: 01/03/24 0611 SCDs Start: 01/03/24 0429  Level of care: Progressive   Code Status: Full Code  Subjective: Patient was lying comfortably when seen today.  No new concern.  Remained pleasant and calm.  Watching TV.  Physical Exam: Vitals:   03/06/24 0503 03/06/24 0753 03/06/24 0849 03/06/24 1225  BP: (!) 157/97 (!) 151/91 (!) 151/91 (!) 153/93  Pulse: 79 74 74 70  Resp: 17 18    Temp: 97.6 F (36.4 C) 97.6 F (36.4 C)  97.7 F (36.5 C)  TempSrc: Oral Axillary  Oral  SpO2: 100% 99%  100%  Weight:      Height:       General.  Malnourished lady, in no acute distress. Pulmonary.  Lungs clear bilaterally, normal respiratory effort. CV.  Regular rate and rhythm,  no JVD, rub or murmur. Abdomen.  Soft, nontender, nondistended, BS positive. CNS.  Alert and oriented .  No focal neurologic deficit. Extremities.  No edema, no cyanosis, pulses intact and symmetrical.  Data Reviewed:      Latest Ref Rng & Units 01/29/2024    6:33 AM 01/24/2024     1:37 AM 01/15/2024    6:23 AM  CBC  WBC 4.0 - 10.5 K/uL 7.2  7.4  8.9   Hemoglobin 12.0 - 15.0 g/dL 86.6  87.2  86.8   Hematocrit 36.0 - 46.0 % 40.1  39.4  39.7   Platelets 150 - 400 K/uL 284  444  408       Latest Ref Rng & Units 01/29/2024    6:33 AM 01/24/2024    1:37 AM 01/21/2024    6:03 AM  BMP  Glucose 70 - 99 mg/dL 826  862  847   BUN 8 - 23 mg/dL 36  29  33   Creatinine 0.44 - 1.00 mg/dL 9.13  9.28  9.16   Sodium 135 - 145 mmol/L 135  141  139   Potassium 3.5 - 5.1 mmol/L 4.0  4.2  4.2   Chloride 98 - 111 mmol/L 99  106  104   CO2 22 - 32 mmol/L 24  24  20    Calcium 8.9 - 10.3 mg/dL 9.9  9.2  9.3    No results found.  Family Communication: Discussed with patient  Disposition: Status is: Inpatient Remains inpatient appropriate because: pending safe discharge plan  Planned Discharge Destination: Skilled nursing facility   Author: Amaryllis Dare, MD 03/06/2024 2:43 PM Secure chat 7am to 7pm For on call review [email or web address]

## 2024-03-07 DIAGNOSIS — F1410 Cocaine abuse, uncomplicated: Secondary | ICD-10-CM | POA: Diagnosis not present

## 2024-03-07 DIAGNOSIS — I619 Nontraumatic intracerebral hemorrhage, unspecified: Secondary | ICD-10-CM | POA: Diagnosis not present

## 2024-03-07 DIAGNOSIS — I5042 Chronic combined systolic (congestive) and diastolic (congestive) heart failure: Secondary | ICD-10-CM | POA: Diagnosis not present

## 2024-03-07 DIAGNOSIS — I610 Nontraumatic intracerebral hemorrhage in hemisphere, subcortical: Secondary | ICD-10-CM | POA: Diagnosis not present

## 2024-03-07 NOTE — Progress Notes (Signed)
 Progress Note   Patient: Amanda [name] FMW:[id number] DOB: 13-May-1960 DOA: 01/03/2024     65 DOS: the patient was seen and examined on 03/07/2024   Brief hospital course: Amanda [name] is 63 year old female with history of hypertension, cocaine abuse, diabetes type 2 who presented with lethargy, near syncope.  Found to be severely hypertensive, UDS positive for cocaine, with acute intraparenchymal hemorrhage , intraventricular hemorrhage and hydrocephalus.  Patient was admitted to ICU for close blood pressure management.  Neurosurgery consulted.  Status post right frontal ventriculostomy. EVD eventually removed on 10/3. Hospital course remarkable for dysphagia. Dietitian/speech therapy recommending PEG placement. PT/OT recommending SNF on discharge.  S/o PEG placement by IR on 10/16.  Tube feeds stopped 02/18/24, currently on supplements, plan to remove PEG 03/19/24 if oral intake better. On and off agitated and did require restraints. TOC following, placement challenging.  11/22-25: Calmer, alert and oriented.  P.o. intake improving. Still pending disposition.  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 Continues to receive PT. Plan for discharge to SNF.  EVD removed 01/14/2024, scalp staples removed 02/01/2024.   No reports of agitations recently Still pending bed offer per TOC. Challenging placement.   Hypertension:  Continue carvedilol  25 mg twice daily, Entresto .   Amlodipine  discontinued due to history of systolic CHF and blood pressure now optimal.   Dysphagia:  Moderate malnutrition- S/P PEG placement on 10/16. Currently on dysphagia 3 diet and eating better, meeting all the nutritional demands, dietitian on board, tube feeds stopped 02/18/2024.  Continue protein supplements.  Plan to remove PEG tube around 03/19/2024 if continues to meet her nutritional  demands.   Chronic combined systolic/diastolic CHF:  continue Entresto  and spironolactone .  Not on aspirin  due to recent hemorrhage.    Type 2 diabetes: Recent A1c of 6.6. Glucose controlled on sliding scale insulin .   Cocaine abuse: Consulted TOC for cocaine cessation resources.  S/p fall- Continue delirium precautions. Restraints for safety prn. Fall precautions.     Out of bed to chair. Incentive spirometry. Nursing supportive care. Fall, aspiration precautions. Diet:  Diet Orders (From admission, onward)     Start     Ordered   02/29/24 1041  DIET DYS 3 Room service appropriate? Yes with Assist; Fluid consistency: Thin  Diet effective now       Question Answer Comment  Room service appropriate? Yes with Assist   Fluid consistency: Thin      02/29/24 1041           DVT prophylaxis: enoxaparin  (LOVENOX ) injection 40 mg Start: 02/21/24 1430 Place and maintain sequential compression device Start: 01/03/24 0611 SCDs Start: 01/03/24 0429  Level of care: Progressive   Code Status: Full Code  Subjective: Patient stays calm and watching TV.  No new concern  Physical Exam: Vitals:   03/06/24 2005 03/07/24 0327 03/07/24 0725 03/07/24 0844  BP: (!) 155/98 131/83 (!) 141/79 (!) 141/79  Pulse: 68 60 71 71  Resp: 17 16 18    Temp: 97.6 F (36.4 C) (!) 97.4 F (36.3 C) 98.5 F (36.9 C)   TempSrc: Oral Oral Oral   SpO2: 96% 97% 100%   Weight:      Height:       General.  Malnourished lady, in no acute distress. Pulmonary.  Lungs clear bilaterally, normal respiratory effort. CV.  Regular rate and rhythm, no JVD, rub or murmur. Abdomen.  Soft, nontender, nondistended, BS positive. CNS.  Alert and oriented .  No focal neurologic deficit. Extremities.  No edema, no cyanosis, pulses intact and symmetrical.  Data Reviewed:      Latest Ref Rng & Units 01/29/2024    6:33 AM 01/24/2024    1:37 AM 01/15/2024    6:23 AM  CBC  WBC 4.0 - 10.5 K/uL 7.2  7.4  8.9    Hemoglobin 12.0 - 15.0 g/dL 86.6  87.2  86.8   Hematocrit 36.0 - 46.0 % 40.1  39.4  39.7   Platelets 150 - 400 K/uL 284  444  408       Latest Ref Rng & Units 01/29/2024    6:33 AM 01/24/2024    1:37 AM 01/21/2024    6:03 AM  BMP  Glucose 70 - 99 mg/dL 826  862  847   BUN 8 - 23 mg/dL 36  29  33   Creatinine 0.44 - 1.00 mg/dL 9.13  9.28  9.16   Sodium 135 - 145 mmol/L 135  141  139   Potassium 3.5 - 5.1 mmol/L 4.0  4.2  4.2   Chloride 98 - 111 mmol/L 99  106  104   CO2 22 - 32 mmol/L 24  24  20    Calcium 8.9 - 10.3 mg/dL 9.9  9.2  9.3    No results found.  Family Communication: Discussed with patient  Disposition: Status is: Inpatient Remains inpatient appropriate because: pending safe discharge plan  Planned Discharge Destination: Skilled nursing facility   Author: Amaryllis Dare, MD 03/07/2024 3:01 PM Secure chat 7am to 7pm For on call review [email or web address]

## 2024-03-07 NOTE — Progress Notes (Signed)
 Occupational Therapy Treatment Patient Details Name: Amanda [name] MRN: [id number] DOB: 11/12/1960 Today's Date: 03/07/2024   History of present illness 63 yo female presents to [facility] on 9/22 with lethargy and near-syncope s/p cocaine and opioid ingestion. 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 S/p R frontal ventriculostomy, burr holes 9/22. ETT 9/22-9/23. PMHx of cocaine abuse, CHF, CKD, chronic cough, HTN and DM2.   OT comments  Pt greeted in bed, agreeable to participate with OT. Pt seen for goal update today as reflected in OT POC. Pt progressing well towards functional goals today. Updated OT frequency given pt's consistent progress. She was generally min A for functional transfers and ambulation with RW today. Completed standing grooming with min A with cog cues for proper sequencing. Mod A for toileting tasks to locate toilet paper dispenser in L environment and for sequencing with clothing mgmt. Left in chair setup with lunch tray. OT to continue to follow.      If plan is discharge home, recommend the following:  A lot of help with bathing/dressing/bathroom;Assistance with cooking/housework;Help with stairs or ramp for entrance;Direct supervision/assist for financial management;Direct supervision/assist for medications management;Assist for transportation;Supervision due to cognitive status;A little help with walking and/or transfers   Equipment Recommendations  Other (comment) (defer)    Recommendations for Other Services      Precautions / Restrictions Precautions Precautions: Fall;Other (comment) Recall of Precautions/Restrictions: Impaired Precaution/Restrictions Comments: PEG, SBP < 160, impulsive Restrictions Weight Bearing Restrictions Per Provider Order: No       Mobility Bed Mobility Overal bed mobility: Needs Assistance Bed Mobility: Supine to Sit   Sidelying to sit:  Min assist, HOB elevated, Used rails       General bed mobility comments: assist for trunk elevation to transition to EOB, exited to the R side    Transfers Overall transfer level: Needs assistance Equipment used: Rolling walker (2 wheels) Transfers: Sit to/from Stand Sit to Stand: Min assist Stand pivot transfers: Min assist         General transfer comment: STS from bed with cues for safe hand placement. Ambulated in room with RW and min A for safety/cues to maintain safe proximity to RW.     Balance Overall balance assessment: Needs assistance Sitting-balance support: No upper extremity supported, Feet supported Sitting balance-Leahy Scale: Fair Sitting balance - Comments: seated EOB, no LOB observed Postural control: Posterior lean Standing balance support: Reliant on assistive device for balance, Bilateral upper extremity supported, During functional activity Standing balance-Leahy Scale: Poor Standing balance comment: reliant on RW and external cues to maintain dynamic balance                           ADL either performed or assessed with clinical judgement   ADL Overall ADL's : Needs assistance/impaired Eating/Feeding: Set up;Sitting Eating/Feeding Details (indicate cue type and reason): setup with lunch tray in chair Grooming: Minimal assistance;Cueing for sequencing;Standing;Wash/dry hands Grooming Details (indicate cue type and reason): cues for sequencing (pt attempted to retrieve paper towels before completely rinsing soap off hands                 Toilet Transfer: Minimal assistance;Ambulation;Regular Toilet;Rolling walker (2 wheels) Toilet Transfer Details (indicate cue type and reason): assist for safe approach to toilet and for powering up from lower surface height Toileting- Clothing Manipulation and Hygiene: Moderate assistance;Sitting/lateral lean;Sit to/from stand Toileting - Clothing Manipulation Details (indicate cue type  and reason):  assist for clothing mgmt up and for stability; cues and assist to locate and retrieve toilet paper from TP dispenser located in L environment     Functional mobility during ADLs: Minimal assistance;Rolling walker (2 wheels)      Extremity/Trunk Assessment              Vision       Perception Perception Perception: Impaired Preception Impairment Details: Inattention/Neglect Perception-Other Comments: reduced L attention but improving with cues and prompting   Praxis     Communication Communication Communication: No apparent difficulties Factors Affecting Communication: Reduced clarity of speech   Cognition Arousal: Alert Behavior During Therapy: WFL for tasks assessed/performed, Flat affect Cognition: Cognition impaired   Orientation impairments: Situation, Time (re-oriented appropriately; unable to state the time despite logical cueing) Awareness: Intellectual awareness impaired Memory impairment (select all impairments): Short-term memory, Working memory Attention impairment (select first level of impairment): Sustained attention Executive functioning impairment (select all impairments): Initiation, Sequencing, Reasoning, Problem solving OT - Cognition Comments: cues throughout for safety due to impulsivity                 Following commands: Impaired Following commands impaired: Follows one step commands with increased time, Follows one step commands inconsistently      Cueing   Cueing Techniques: Verbal cues, Visual cues, Gestural cues  Exercises      Shoulder Instructions       General Comments VSS throughout on RA    Pertinent Vitals/ Pain       Pain Assessment Pain Assessment: No/denies pain Pain Score: 0-No pain  Home Living                                          Prior Functioning/Environment              Frequency  Min 2X/week        Progress Toward Goals  OT Goals(current goals can now be found in the  care plan section)  Progress towards OT goals: Progressing toward goals  Acute Rehab OT Goals Time For Goal Achievement: 03/21/24  Plan      Co-evaluation                 AM-PAC OT 6 Clicks Daily Activity     Outcome Measure   Help from another person eating meals?: A Little Help from another person taking care of personal grooming?: A Little Help from another person toileting, which includes using toliet, bedpan, or urinal?: A Lot Help from another person bathing (including washing, rinsing, drying)?: A Lot Help from another person to put on and taking off regular upper body clothing?: A Little Help from another person to put on and taking off regular lower body clothing?: A Lot 6 Click Score: 15    End of Session Equipment Utilized During Treatment: Gait belt;Rolling walker (2 wheels)  OT Visit Diagnosis: Unsteadiness on feet (R26.81);Other abnormalities of gait and mobility (R26.89);Muscle weakness (generalized) (M62.81)   Activity Tolerance Patient tolerated treatment well   Patient Left in chair;with call bell/phone within reach;with chair alarm set   Nurse Communication Mobility status        Time: 8857-8789 OT Time Calculation (min): 28 min  Charges: OT General Charges $OT Visit: 1 Visit OT Treatments $Self Care/Home Management : 23-37 mins  Shane Badeaux M. Aziah Brostrom, OTR/L [facility] Acute Rehabilitation Services [phone])  [phone] Secure Chat Preferred  [name] 03/07/2024, 3:22 PM

## 2024-03-07 NOTE — Plan of Care (Signed)
  Problem: Education: Goal: Ability to describe self-care measures that may prevent or decrease complications (Diabetes Survival Skills Education) will improve Outcome: Not Progressing   Problem: Coping: Goal: Ability to adjust to condition or change in health will improve Outcome: Not Progressing   Problem: Fluid Volume: Goal: Ability to maintain a balanced intake and output will improve Outcome: Not Progressing   Problem: Health Behavior/Discharge Planning: Goal: Ability to identify and utilize available resources and services will improve Outcome: Not Progressing Goal: Ability to manage health-related needs will improve Outcome: Not Progressing   Problem: Metabolic: Goal: Ability to maintain appropriate glucose levels will improve Outcome: Not Progressing

## 2024-03-08 DIAGNOSIS — I161 Hypertensive emergency: Secondary | ICD-10-CM | POA: Diagnosis not present

## 2024-03-08 DIAGNOSIS — I619 Nontraumatic intracerebral hemorrhage, unspecified: Secondary | ICD-10-CM | POA: Diagnosis not present

## 2024-03-08 MED ORDER — ENSURE PLUS HIGH PROTEIN PO LIQD: 237.0000 mL | Freq: Three times a day (TID) | ORAL | Status: AC

## 2024-03-08 MED ORDER — SACUBITRIL-VALSARTAN 97-103 MG PO TABS: 1.0000 | ORAL_TABLET | Freq: Two times a day (BID) | ORAL | Status: AC

## 2024-03-08 MED ORDER — CARVEDILOL 25 MG PO TABS: 25.0000 mg | ORAL_TABLET | Freq: Two times a day (BID) | ORAL | Status: AC

## 2024-03-08 MED ORDER — FREE WATER: 50.0000 mL | Freq: Every day | Status: AC

## 2024-03-08 MED ORDER — QUETIAPINE FUMARATE 25 MG PO TABS: 25.0000 mg | ORAL_TABLET | Freq: Every day | ORAL | Status: AC

## 2024-03-08 MED ORDER — IPRATROPIUM-ALBUTEROL 0.5-2.5 (3) MG/3ML IN SOLN: 3.0000 mL | RESPIRATORY_TRACT | Status: AC | PRN

## 2024-03-08 NOTE — Progress Notes (Signed)
 Speech Language Pathology Treatment: Cognitive-Linguistic  Patient Details Name: Amanda [name] MRN: [id number] DOB: 1960/09/16 Today's Date: 03/08/2024 Time: 8285-8273 SLP Time Calculation (min) (ACUTE ONLY): 12 min  Assessment / Plan / Recommendation Clinical Impression  Patient seen by SLP for skilled treatment session focused on cognitive-linguistic goals. Patient was awake, alert and very pleasant. She was oriented x4. During discussion of recent therapy and patient's thoughts on her progress, she was observed to become perseverative on words, tangential and agrammatical. She required SLP giving contextual cues for her to demonstrate awareness to her deficits, mainly stating that my arms are ok but my legs, they get weak if I go a long ways. She appears oriented to general situation and aware of general plan for rehab but will require ongoing intervention to reinforce her awareness to deficits as well as to work on improving her reasoning and problem solving skills. She did indicate that she is mostly sedentary at baseline, sitting and watching TV mostly. SLP will continue to follow.   HPI HPI: [name] with polysubstance/cocaine abuse 3 hrs before presentation, who called EMS for AMS, lethargy and near syncope. Her condition deteriorated (unable to protect her airway and more lethargic) and got intubated. 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 S/o PEG placement by IR on 10/16.  Tube feeds stopped 02/18/24, currently on supplements, plan to remove PEG 03/19/24 if oral intake better. HTN, CHF, DM-2.      SLP Plan  Continue with current plan of care          Recommendations   SLP at next venue of care                      Frequent or constant Supervision/Assistance Cognitive  communication deficit (R41.841)     Continue with current plan of care     Norleen IVAR Blase, MA, CCC-SLP Speech Therapy

## 2024-03-08 NOTE — TOC Progression Note (Signed)
 Transition of Care [facility]) - Progression Note    Patient Details  Name: Amanda [name] MRN: [id number] Date of Birth: 10/25/1960  Transition of Care [facility]) CM/SW Contact  Almarie CHRISTELLA Goodie, [place] Phone Number: 03/08/2024, 11:12 AM  Clinical Narrative:   CSW following for disposition. No beds available at this time, referral faxed again. CSW to follow.    Expected Discharge Plan: Skilled Nursing Facility Barriers to Discharge: English As A Second Language Teacher, Continued Medical Work up, Inadequate or no insurance, Facility will not accept until restraint criteria met, SNF Pending bed offer               Expected Discharge Plan and Services In-house Referral: Clinical Social Work   Post Acute Care Choice: Skilled Nursing Facility Living arrangements for the past 2 months: Single Family Home                                       Social Drivers of Health (SDOH) Interventions SDOH Screenings   Food Insecurity: No Food Insecurity (07/16/2023)   Received from [facility]  Housing: Low Risk  (07/16/2023)   Received from [facility]  Transportation Needs: No Transportation Needs (07/16/2023)   Received from [facility]  Utilities: Not At Risk (07/16/2023)   Received from [facility]  Alcohol Screen: Low Risk  (05/14/2023)  Financial Resource Strain: Low Risk  (07/16/2023)   Received from [facility]  Tobacco Use: High Risk (01/26/2024)    Readmission Risk Interventions    03/26/2022   10:50 AM  Readmission Risk Prevention Plan  Transportation Screening Complete  HRI or Home Care Consult Complete  Social Work Consult for Recovery Care Planning/Counseling Complete  Palliative Care Screening Not Applicable  Medication Review Oceanographer) Referral to Pharmacy

## 2024-03-08 NOTE — Progress Notes (Signed)
  Progress Note   Patient: Amanda [name] FMW:[id number] DOB: [birth year] DOA: 01/03/2024     63 DOS: the patient was seen and examined on 03/08/2024   Brief hospital course: 63 year old female with history of hypertension, cocaine abuse, diabetes type 2 who presented with lethargy, near syncope.  Found to be severely hypertensive, UDS positive for cocaine, with acute intraparenchymal hemorrhage , intraventricular hemorrhage and hydrocephalus.  Patient was admitted to ICU for close blood pressure management.  Neurosurgery consulted.  Status post right frontal ventriculostomy. EVD eventually removed on 10/3. Hospital course remarkable for dysphagia. Dietitian/speech therapy recommending PEG placement. PT/OT recommending SNF on discharge.  S/o PEG placement by IR on 10/16.  Tube feeds stopped 02/18/24, currently on supplements, plan to remove PEG 03/19/24 if oral intake better. On and off agitated and did require restraints. TOC following, placement challenging.   11/22-25: Calmer, alert and oriented.  P.o. intake improving. 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 Continues to receive PT. Plan for discharge to SNF.  EVD removed 01/14/2024, scalp staples removed 02/01/2024.         No reports of agitations recently Still pending bed offer per TOC. Continues to have a challenging placement.   Hypertension:  Continue carvedilol  25 mg twice daily, Entresto .   Amlodipine  discontinued due to history of systolic CHF  BP overall stable   Dysphagia:  Moderate malnutrition- S/P PEG placement on 10/16. Currently on dysphagia 3 diet and eating better, meeting all the nutritional demands, dietitian on board, tube feeds stopped 02/18/2024.  Continue protein supplements.  Plan to remove PEG tube around 03/19/2024 if continues to meet her nutritional demands.    Chronic combined systolic/diastolic CHF:  continue Entresto  and spironolactone .  Not on aspirin  due to recent hemorrhage.    Type 2 diabetes: Recent A1c of 6.6. Glucose controlled on sliding scale insulin .   Cocaine abuse: Consulted TOC for cocaine cessation resources.   S/p fall- Continue delirium precautions. Restraints for safety prn. Fall precautions.       Subjective: Very eager to go home  Physical Exam: Vitals:   03/08/24 0915 03/08/24 0927 03/08/24 1140 03/08/24 1523  BP: 130/83 130/83 128/88 (!) 155/93  Pulse: 82 82 80 81  Resp: 18  18 18   Temp: 98.4 F (36.9 C)  98 F (36.7 C) 98 F (36.7 C)  TempSrc: Oral  Oral Oral  SpO2: 98%  96% 98%  Weight:      Height:       General exam: Awake, laying in bed, in nad Respiratory system: Normal respiratory effort, no wheezing Cardiovascular system: regular rate, s1, s2 Gastrointestinal system: Soft, nondistended, positive BS Central nervous system: CN2-12 grossly intact, strength intact Extremities: Perfused, no clubbing Skin: Normal skin turgor, no notable skin lesions seen Psychiatry: Mood normal // no visual hallucinations   Data Reviewed:  There are no new results to review at this time.  Family Communication: Pt in room, family not at bedside  Disposition: Status is: Inpatient Remains inpatient appropriate because: severity of illness  Planned Discharge Destination: Skilled nursing facility    Author: Garnette Pelt, MD 03/08/2024 4:21 PM  For on call review [email or web address].

## 2024-03-09 DIAGNOSIS — I161 Hypertensive emergency: Secondary | ICD-10-CM | POA: Diagnosis not present

## 2024-03-09 DIAGNOSIS — I619 Nontraumatic intracerebral hemorrhage, unspecified: Secondary | ICD-10-CM | POA: Diagnosis not present

## 2024-03-09 NOTE — Plan of Care (Signed)
  Problem: Education: Goal: Ability to describe self-care measures that may prevent or decrease complications (Diabetes Survival Skills Education) will improve Outcome: Not Progressing   Problem: Coping: Goal: Ability to adjust to condition or change in health will improve Outcome: Not Progressing   Problem: Fluid Volume: Goal: Ability to maintain a balanced intake and output will improve Outcome: Not Progressing   Problem: Health Behavior/Discharge Planning: Goal: Ability to identify and utilize available resources and services will improve Outcome: Not Progressing Goal: Ability to manage health-related needs will improve Outcome: Not Progressing   Problem: Skin Integrity: Goal: Risk for impaired skin integrity will decrease Outcome: Not Progressing   Problem: Tissue Perfusion: Goal: Adequacy of tissue perfusion will improve Outcome: Not Progressing   Problem: Education: Goal: Knowledge of General Education information will improve Description: Including pain rating scale, medication(s)/side effects and non-pharmacologic comfort measures Outcome: Not Progressing

## 2024-03-09 NOTE — Plan of Care (Signed)
  Problem: Education: Goal: Ability to describe self-care measures that may prevent or decrease complications (Diabetes Survival Skills Education) will improve Outcome: Not Progressing   Problem: Coping: Goal: Ability to adjust to condition or change in health will improve Outcome: Not Progressing   Problem: Fluid Volume: Goal: Ability to maintain a balanced intake and output will improve Outcome: Not Progressing   Problem: Health Behavior/Discharge Planning: Goal: Ability to identify and utilize available resources and services will improve Outcome: Not Progressing Goal: Ability to manage health-related needs will improve Outcome: Not Progressing   Problem: Metabolic: Goal: Ability to maintain appropriate glucose levels will improve Outcome: Not Progressing

## 2024-03-09 NOTE — Progress Notes (Signed)
  Progress Note   Patient: Amanda [name] FMW:[id number] DOB: [birth year] DOA: 01/03/2024     63 DOS: the patient was seen and examined on 03/09/2024   Brief hospital course: 63 year old female with history of hypertension, cocaine abuse, diabetes type 2 who presented with lethargy, near syncope.  Found to be severely hypertensive, UDS positive for cocaine, with acute intraparenchymal hemorrhage , intraventricular hemorrhage and hydrocephalus.  Patient was admitted to ICU for close blood pressure management.  Neurosurgery consulted.  Status post right frontal ventriculostomy. EVD eventually removed on 10/3. Hospital course remarkable for dysphagia. Dietitian/speech therapy recommending PEG placement. PT/OT recommending SNF on discharge.  S/o PEG placement by IR on 10/16.  Tube feeds stopped 02/18/24, currently on supplements, plan to remove PEG 03/19/24 if oral intake better. On and off agitated and did require restraints. TOC following, placement challenging.   11/22-25: Calmer, alert and oriented.  P.o. intake improving. 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 Continues to receive PT. Plan for discharge to SNF.  EVD removed 01/14/2024, scalp staples removed 02/01/2024.         No reports of agitations recently Still pending bed offer per TOC. Challenging dispo noted   Hypertension:  Continue carvedilol  25 mg twice daily, Entresto .   Amlodipine  discontinued due to history of systolic CHF  BP overall stable   Dysphagia:  Moderate malnutrition- S/P PEG placement on 10/16. Currently on dysphagia 3 diet and eating better, meeting all the nutritional demands, dietitian on board, tube feeds stopped 02/18/2024.  Continue protein supplements.  Plan to remove PEG tube around 03/19/2024 if continues to meet her nutritional demands.   Chronic combined  systolic/diastolic CHF:  continue Entresto  and spironolactone .  Not on aspirin  due to recent hemorrhage.    Type 2 diabetes: Recent A1c of 6.6. Glucose controlled on sliding scale insulin .   Cocaine abuse: Consulted TOC for cocaine cessation resources.   S/p fall- Continue delirium precautions. Restraints for safety prn. Fall precautions.       Subjective: Without complaints this AM  Physical Exam: Vitals:   03/09/24 0336 03/09/24 0818 03/09/24 1112 03/09/24 1614  BP: (!) 149/104 (!) 128/99 121/82 (!) 162/112  Pulse: 77 81 81 80  Resp: 18 17 15 15   Temp: 98.5 F (36.9 C) 97.9 F (36.6 C) 98.9 F (37.2 C) 99 F (37.2 C)  TempSrc: Oral Oral Oral Oral  SpO2: 100% 100% 98% 100%  Weight:      Height:       General exam: Conversant, in no acute distress Respiratory system: normal chest rise, clear, no audible wheezing Cardiovascular system: regular rhythm, s1-s2 Gastrointestinal system: Nondistended, nontender, pos BS Central nervous system: No seizures, no tremors Extremities: No cyanosis, no joint deformities Skin: No rashes, no pallor Psychiatry: Affect normal // no auditory hallucinations   Data Reviewed:  There are no new results to review at this time.  Family Communication: Pt in room, family not at bedside  Disposition: Status is: Inpatient Remains inpatient appropriate because: severity of illness  Planned Discharge Destination: Skilled nursing facility    Author: Garnette Pelt, MD 03/09/2024 4:32 PM  For on call review [email or web address].

## 2024-03-10 DIAGNOSIS — I161 Hypertensive emergency: Secondary | ICD-10-CM | POA: Diagnosis not present

## 2024-03-10 DIAGNOSIS — I619 Nontraumatic intracerebral hemorrhage, unspecified: Secondary | ICD-10-CM | POA: Diagnosis not present

## 2024-03-10 NOTE — Plan of Care (Signed)
  Problem: Education: Goal: Ability to describe self-care measures that may prevent or decrease complications (Diabetes Survival Skills Education) will improve Outcome: Progressing   Problem: Coping: Goal: Ability to adjust to condition or change in health will improve Outcome: Progressing   Problem: Fluid Volume: Goal: Ability to maintain a balanced intake and output will improve Outcome: Progressing   Problem: Health Behavior/Discharge Planning: Goal: Ability to identify and utilize available resources and services will improve Outcome: Progressing Goal: Ability to manage health-related needs will improve Outcome: Progressing   Problem: Metabolic: Goal: Ability to maintain appropriate glucose levels will improve Outcome: Progressing   Problem: Nutritional: Goal: Maintenance of adequate nutrition will improve Outcome: Progressing Goal: Progress toward achieving an optimal weight will improve Outcome: Progressing   Problem: Skin Integrity: Goal: Risk for impaired skin integrity will decrease Outcome: Progressing   Problem: Tissue Perfusion: Goal: Adequacy of tissue perfusion will improve Outcome: Progressing   Problem: Education: Goal: Knowledge of General Education information will improve Description: Including pain rating scale, medication(s)/side effects and non-pharmacologic comfort measures Outcome: Progressing   Problem: Health Behavior/Discharge Planning: Goal: Ability to manage health-related needs will improve Outcome: Progressing   Problem: Clinical Measurements: Goal: Ability to maintain clinical measurements within normal limits will improve Outcome: Progressing Goal: Will remain free from infection Outcome: Progressing Goal: Diagnostic test results will improve Outcome: Progressing Goal: Respiratory complications will improve Outcome: Progressing Goal: Cardiovascular complication will be avoided Outcome: Progressing   Problem: Activity: Goal:  Risk for activity intolerance will decrease Outcome: Progressing   Problem: Nutrition: Goal: Adequate nutrition will be maintained Outcome: Progressing   Problem: Coping: Goal: Level of anxiety will decrease Outcome: Progressing   Problem: Elimination: Goal: Will not experience complications related to bowel motility Outcome: Progressing Goal: Will not experience complications related to urinary retention Outcome: Progressing   Problem: Pain Managment: Goal: General experience of comfort will improve and/or be controlled Outcome: Progressing   Problem: Safety: Goal: Ability to remain free from injury will improve Outcome: Progressing   Problem: Skin Integrity: Goal: Risk for impaired skin integrity will decrease Outcome: Progressing   Problem: Education: Goal: Knowledge of disease or condition will improve Outcome: Progressing Goal: Knowledge of secondary prevention will improve (MUST DOCUMENT ALL) Outcome: Progressing Goal: Knowledge of patient specific risk factors will improve (DELETE if not current risk factor) Outcome: Progressing   Problem: Intracerebral Hemorrhage Tissue Perfusion: Goal: Complications of Intracerebral Hemorrhage will be minimized Outcome: Progressing   Problem: Coping: Goal: Will verbalize positive feelings about self Outcome: Progressing Goal: Will identify appropriate support needs Outcome: Progressing   Problem: Health Behavior/Discharge Planning: Goal: Ability to manage health-related needs will improve Outcome: Progressing Goal: Goals will be collaboratively established with patient/family Outcome: Progressing   Problem: Self-Care: Goal: Ability to participate in self-care as condition permits will improve Outcome: Progressing Goal: Verbalization of feelings and concerns over difficulty with self-care will improve Outcome: Progressing Goal: Ability to communicate needs accurately will improve Outcome: Progressing   Problem:  Nutrition: Goal: Risk of aspiration will decrease Outcome: Progressing Goal: Dietary intake will improve Outcome: Progressing

## 2024-03-10 NOTE — Progress Notes (Signed)
 Occupational Therapy Treatment Patient Details Name: Amanda [name] MRN: [id number] DOB: [birth year] Today's Date: 03/10/2024   History of present illness 63 yo female presents to [facility] on 9/22 with lethargy and near-syncope s/p cocaine and opioid ingestion. 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 S/p R frontal ventriculostomy, burr holes 9/22. ETT 9/22-9/23. PMHx of cocaine abuse, CHF, CKD, chronic cough, HTN and DM2.   OT comments  Pt greeted in bed, agreeable to participate with OT. Pt with incr emotional variability and distractibility today. Able to be redirected with effort. She is making fair progress towards OT goals. AMPAC 16/24, indicating improving functional performance. She was grossly min A for all mobility and transfers with RW. Remains heavily limited by cognitive and attention deficits. She required mod A for toileting and min A for simple grooming/UB bathing at the sink. Demo's poor sequencing, impaired awareness of mistakes, and impaired problem solving, but improving with continual and repetitive prompting from therapist. Pt left in chair, RN notified. OT to continue to follow.      If plan is discharge home, recommend the following:  A lot of help with bathing/dressing/bathroom;Assistance with cooking/housework;Help with stairs or ramp for entrance;Direct supervision/assist for financial management;Direct supervision/assist for medications management;Assist for transportation;Supervision due to cognitive status;A little help with walking and/or transfers   Equipment Recommendations  Other (comment) (defer)    Recommendations for Other Services      Precautions / Restrictions Precautions Precautions: Fall Recall of Precautions/Restrictions: Impaired Precaution/Restrictions Comments: PEG, SBP < 160, impulsive Restrictions Weight Bearing Restrictions Per Provider Order: No        Mobility Bed Mobility Overal bed mobility: Needs Assistance Bed Mobility: Supine to Sit, Sit to Supine     Supine to sit: Contact guard, HOB elevated, Used rails Sit to supine: Min assist, HOB elevated, Used rails   General bed mobility comments: assist for LE mgmt sit>supine, reliant on bed rails    Transfers Overall transfer level: Needs assistance Equipment used: Rolling walker (2 wheels) Transfers: Sit to/from Stand, Bed to chair/wheelchair/BSC Sit to Stand: Min assist     Step pivot transfers: Min assist     General transfer comment: Stood from bed with VC for hand placement and safety, cued to safely approach furniture/surfaces in room, pt often turning within frame of RW with little awareness of surrounding environment/obstacles     Balance Overall balance assessment: Needs assistance Sitting-balance support: No upper extremity supported, Feet supported Sitting balance-Leahy Scale: Fair Sitting balance - Comments: seated EOB   Standing balance support: Reliant on assistive device for balance, Bilateral upper extremity supported, During functional activity Standing balance-Leahy Scale: Poor Standing balance comment: reliant on RW, often observed nBOS                           ADL either performed or assessed with clinical judgement   ADL Overall ADL's : Needs assistance/impaired     Grooming: Minimal assistance;Cueing for sequencing;Standing;Wash/dry hands Grooming Details (indicate cue type and reason): pt attempting to leave sink after hand hygiene without drying hands, needs sequencing cues Upper Body Bathing: Minimal assistance;Standing Upper Body Bathing Details (indicate cue type and reason): sequencing cues, pt insisting on wanting to re-don soiled gown after initiating bathing R axilla with poor follow-through of remainder of UB bathing Lower Body Bathing: Minimal assistance;Cueing for sequencing;Sit to/from stand Lower Body Bathing Details  (indicate cue type and reason): washing peri area,  cued for thoroughness and cog/sequencing cues (attempting to bathe perineal area with underwear still pulled up         Toilet Transfer: Contact guard assist;Minimal assistance;Ambulation;Regular Toilet;Rolling walker (2 wheels) Toilet Transfer Details (indicate cue type and reason): used grab bars, assist for safe approach and controlled descent Toileting- Clothing Manipulation and Hygiene: Moderate assistance;Cueing for sequencing;Cueing for safety;Sitting/lateral lean;Sit to/from stand Toileting - Clothing Manipulation Details (indicate cue type and reason): pt replaced clean pad on underwear (with accuracy throwing away into trash can located in L environment) with adhesive side towards skin, max cues to turn pad over so sticky side on underwear     Functional mobility during ADLs: Minimal assistance;Cueing for safety;Cueing for sequencing;Rolling walker (2 wheels)      Extremity/Trunk Assessment              Vision   Additional Comments: pt unaware where her cell phone was located (near L hip in bed), poor obstacle navigation in room especially on L side/environment   Perception Perception Perception: Impaired Preception Impairment Details: Inattention/Neglect Perception-Other Comments: L inattention   Praxis     Communication Communication Communication: No apparent difficulties   Cognition Arousal: Alert Behavior During Therapy: Impulsive, Lability Cognition: Cognition impaired   Orientation impairments: Time, Situation Awareness: Intellectual awareness impaired Memory impairment (select all impairments): Short-term memory, Working memory Attention impairment (select first level of impairment): Sustained attention Executive functioning impairment (select all impairments): Initiation, Sequencing, Reasoning, Problem solving OT - Cognition Comments: incr emotional lability today, remains highly distractable and  inattentive; poor reasoning; perseverative on wanting to go home                 Following commands: Impaired Following commands impaired: Follows one step commands with increased time, Follows one step commands inconsistently      Cueing   Cueing Techniques: Verbal cues, Visual cues, Gestural cues  Exercises      Shoulder Instructions       General Comments VSS    Pertinent Vitals/ Pain       Pain Assessment Pain Assessment: Faces Faces Pain Scale: No hurt  Home Living                                          Prior Functioning/Environment              Frequency  Min 2X/week        Progress Toward Goals  OT Goals(current goals can now be found in the care plan section)  Progress towards OT goals: Progressing toward goals     Plan      Co-evaluation                 AM-PAC OT 6 Clicks Daily Activity     Outcome Measure   Help from another person eating meals?: A Little Help from another person taking care of personal grooming?: A Little Help from another person toileting, which includes using toliet, bedpan, or urinal?: A Lot Help from another person bathing (including washing, rinsing, drying)?: A Little Help from another person to put on and taking off regular upper body clothing?: A Little Help from another person to put on and taking off regular lower body clothing?: A Lot 6 Click Score: 16    End of Session Equipment Utilized During Treatment: Gait belt;Rolling walker (2 wheels)  OT Visit Diagnosis: Unsteadiness on  feet (R26.81);Other abnormalities of gait and mobility (R26.89);Muscle weakness (generalized) (M62.81)   Activity Tolerance Patient tolerated treatment well   Patient Left in chair;with call bell/phone within reach;with chair alarm set   Nurse Communication Mobility status;Other (comment) (pt status and location at end of session)        Time: 8881-8857 OT Time Calculation (min): 24  min  Charges: OT General Charges $OT Visit: 1 Visit OT Treatments $Self Care/Home Management : 23-37 mins  Hawken Bielby M. [place], OTR/L [facility] Acute Rehabilitation Services [phone] Secure Chat Preferred  Dulse Rutan 03/10/2024, 1:36 PM

## 2024-03-10 NOTE — Progress Notes (Signed)
  Progress Note   Patient: Amanda [name] FMW:[id number] DOB: 10-04-1960 DOA: 01/03/2024     63 DOS: the patient was seen and examined on 03/10/2024   Brief hospital course: 63 year old female with history of hypertension, cocaine abuse, diabetes type 2 who presented with lethargy, near syncope.  Found to be severely hypertensive, UDS positive for cocaine, with acute intraparenchymal hemorrhage , intraventricular hemorrhage and hydrocephalus.  Patient was admitted to ICU for close blood pressure management.  Neurosurgery consulted.  Status post right frontal ventriculostomy. EVD eventually removed on 10/3. Hospital course remarkable for dysphagia. Dietitian/speech therapy recommending PEG placement. PT/OT recommending SNF on discharge.  S/o PEG placement by IR on 10/16.  Tube feeds stopped 02/18/24, currently on supplements, plan to remove PEG 03/19/24 if oral intake better. On and off agitated and did require restraints. TOC following, placement challenging.   11/22-25: Calmer, alert and oriented.  P.o. intake improving. 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 Continues to receive PT. Plan for discharge to SNF.  EVD removed 01/14/2024, scalp staples removed 02/01/2024.         No reports of agitations recently Still pending bed offer per TOC. Challenging dispo noted Pt is feeling frustrated about still being in hospital   Hypertension:  Continue carvedilol  25 mg twice daily, Entresto .   Amlodipine  discontinued due to history of systolic CHF  BP overall stable   Dysphagia:  Moderate malnutrition- S/P PEG placement on 10/16. Currently on dysphagia 3 diet and eating better, meeting all the nutritional demands, dietitian on board, tube feeds stopped 02/18/2024.  Continue protein supplements.  Plan to remove PEG tube around 03/19/2024 if  continues to meet her nutritional demands.   Chronic combined systolic/diastolic CHF:  continue Entresto  and spironolactone .  Not on aspirin  due to recent hemorrhage.    Type 2 diabetes: Recent A1c of 6.6. Glucose controlled on sliding scale insulin .   Cocaine abuse: Consulted TOC for cocaine cessation resources.   S/p fall- Continue delirium precautions. Restraints for safety prn. Fall precautions.       Subjective: Feeling frustrated about still being in hospital  Physical Exam: Vitals:   03/09/24 2315 03/10/24 0415 03/10/24 0700 03/10/24 1151  BP: 133/72 131/75 (!) 146/107 (!) 160/93  Pulse: 61 68 85 75  Resp: 18 16 18 19   Temp: 98.4 F (36.9 C) 98.4 F (36.9 C) 98.6 F (37 C) 98 F (36.7 C)  TempSrc:  Oral Oral Oral  SpO2: 100% 100% 99% 100%  Weight:      Height:       General exam: Awake, laying in bed, in nad Respiratory system: Normal respiratory effort, no wheezing Cardiovascular system: regular rate, s1, s2 Gastrointestinal system: Soft, nondistended, positive BS Central nervous system: CN2-12 grossly intact, strength intact Extremities: Perfused, no clubbing Skin: Normal skin turgor, no notable skin lesions seen Psychiatry: Mood normal // affect appropriate  Data Reviewed:  There are no new results to review at this time.  Family Communication: Pt in room, family not at bedside  Disposition: Status is: Inpatient Remains inpatient appropriate because: severity of illness  Planned Discharge Destination: Skilled nursing facility    Author: Garnette Pelt, MD 03/10/2024 2:11 PM  For on call review [email or web address].

## 2024-03-10 NOTE — Progress Notes (Signed)
 Physical Therapy Treatment Patient Details Name: Amanda [name] MRN: [id number] DOB: 04-09-1961 Today's Date: 03/10/2024   History of Present Illness 63 yo female presents to [facility] on 9/22 with lethargy and near-syncope s/p cocaine and opioid ingestion. 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 S/p R frontal ventriculostomy, burr holes 9/22. ETT 9/22-9/23. PMHx of cocaine abuse, CHF, CKD, chronic cough, HTN and DM2.    PT Comments  Pt received in supine and agreeable to session. Pt continues to demonstrate difficulty with bed mobility due to L inattention and difficulty problem solving requiring cues. Pt able to perform gait trial with HHA this session, but is limited by fatigue. Pt demonstrates increased unsteadiness with single UE support, but improved upright posture with cues. Pt continues to benefit from PT services to progress toward functional mobility goals.     If plan is discharge home, recommend the following: Supervision due to cognitive status;Assist for transportation;Help with stairs or ramp for entrance;Assistance with cooking/housework;Direct supervision/assist for medications management;Direct supervision/assist for financial management;A lot of help with walking and/or transfers;A lot of help with bathing/dressing/bathroom   Can travel by private vehicle     No  Equipment Recommendations  Rolling walker (2 wheels);BSC/3in1    Recommendations for Other Services       Precautions / Restrictions Precautions Precautions: Fall Recall of Precautions/Restrictions: Impaired Precaution/Restrictions Comments: PEG, SBP < 160, impulsive Restrictions Weight Bearing Restrictions Per Provider Order: No     Mobility  Bed Mobility Overal bed mobility: Needs Assistance Bed Mobility: Supine to Sit, Sit to Supine     Supine to sit: Contact guard, HOB elevated, Used rails Sit to supine:  HOB elevated, Used rails, Contact guard assist   General bed mobility comments: cues for L attention and sequencing due to difficulty with trunk elevation, but no physical assist needed    Transfers Overall transfer level: Needs assistance Equipment used: None Transfers: Sit to/from Stand Sit to Stand: Min assist           General transfer comment: STS from EOB x2 with min A for stability    Ambulation/Gait Ambulation/Gait assistance: Min assist Gait Distance (Feet): 100 Feet Assistive device: 1 person hand held assist Gait Pattern/deviations: Step-through pattern, Trunk flexed, Decreased stride length, Shuffle Gait velocity: reduced     General Gait Details: Pt demonstrates unsteadiness without RW support requiring min A, but no overt LOB. Cues for upright posture and increased step length. Cues for safety due to impulsivity and easy distraction   Stairs             Wheelchair Mobility     Tilt Bed    Modified Rankin (Stroke Patients Only) Modified Rankin (Stroke Patients Only) Pre-Morbid Rankin Score: No symptoms Modified Rankin: Moderately severe disability     Balance Overall balance assessment: Needs assistance Sitting-balance support: No upper extremity supported, Feet supported Sitting balance-Leahy Scale: Fair Sitting balance - Comments: seated EOB   Standing balance support: Reliant on assistive device for balance, Bilateral upper extremity supported, During functional activity Standing balance-Leahy Scale: Poor Standing balance comment: reliant on UE support for balance                            Communication Communication Communication: No apparent difficulties Factors Affecting Communication: Reduced clarity of speech  Cognition Arousal: Alert Behavior During Therapy: Impulsive   PT - Cognitive impairments: Awareness, Memory, Attention, Problem solving, Safety/Judgement  PT - Cognition  Comments: Pt easily distracted requiring frequent redirection and cues for attention Following commands: Impaired Following commands impaired: Follows one step commands with increased time    Cueing Cueing Techniques: Verbal cues, Visual cues, Gestural cues  Exercises      General Comments General comments (skin integrity, edema, etc.): VSS      Pertinent Vitals/Pain Pain Assessment Pain Assessment: Faces Faces Pain Scale: Hurts a little bit Pain Location: back Pain Descriptors / Indicators: Aching Pain Intervention(s): Monitored during session     PT Goals (current goals can now be found in the care plan section) Acute Rehab PT Goals Patient Stated Goal: to go home PT Goal Formulation: With patient Time For Goal Achievement: 02/24/24 Progress towards PT goals: Progressing toward goals    Frequency    Min 2X/week       AM-PAC PT 6 Clicks Mobility   Outcome Measure  Help needed turning from your back to your side while in a flat bed without using bedrails?: A Little Help needed moving from lying on your back to sitting on the side of a flat bed without using bedrails?: A Little Help needed moving to and from a bed to a chair (including a wheelchair)?: A Little Help needed standing up from a chair using your arms (e.g., wheelchair or bedside chair)?: A Little Help needed to walk in hospital room?: A Little Help needed climbing 3-5 steps with a railing? : A Little 6 Click Score: 18    End of Session Equipment Utilized During Treatment: Gait belt Activity Tolerance: Patient tolerated treatment well;Patient limited by fatigue Patient left: in bed;with call bell/phone within reach;with bed alarm set Nurse Communication: Mobility status PT Visit Diagnosis: Unsteadiness on feet (R26.81);Difficulty in walking, not elsewhere classified (R26.2);Other abnormalities of gait and mobility (R26.89);Other symptoms and signs involving the nervous system (R29.898);Muscle weakness  (generalized) (M62.81)     Time: 8682-8656 PT Time Calculation (min) (ACUTE ONLY): 26 min  Charges:    $Gait Training: 8-22 mins $Therapeutic Activity: 8-22 mins PT General Charges $$ ACUTE PT VISIT: 1 Visit                    Darryle George, PTA Acute Rehabilitation Services Secure Chat Preferred  [phone]    Darryle George 03/10/2024, 1:59 PM

## 2024-03-10 NOTE — Plan of Care (Signed)
 Problem: Education: Goal: Ability to describe self-care measures that may prevent or decrease complications (Diabetes Survival Skills Education) will improve 03/10/2024 2053 by Valorie Inocente NOVAK, RN Outcome: Progressing 03/10/2024 2050 by Valorie Inocente NOVAK, RN Outcome: Progressing   Problem: Coping: Goal: Ability to adjust to condition or change in health will improve 03/10/2024 2053 by Valorie Inocente NOVAK, RN Outcome: Progressing 03/10/2024 2050 by Valorie Inocente NOVAK, RN Outcome: Progressing   Problem: Fluid Volume: Goal: Ability to maintain a balanced intake and output will improve 03/10/2024 2053 by Valorie Inocente NOVAK, RN Outcome: Progressing 03/10/2024 2050 by Valorie Inocente NOVAK, RN Outcome: Progressing   Problem: Health Behavior/Discharge Planning: Goal: Ability to identify and utilize available resources and services will improve 03/10/2024 2053 by Valorie Inocente NOVAK, RN Outcome: Progressing 03/10/2024 2050 by Valorie Inocente NOVAK, RN Outcome: Progressing Goal: Ability to manage health-related needs will improve 03/10/2024 2053 by Valorie Inocente NOVAK, RN Outcome: Progressing 03/10/2024 2050 by Valorie Inocente NOVAK, RN Outcome: Progressing   Problem: Metabolic: Goal: Ability to maintain appropriate glucose levels will improve 03/10/2024 2053 by Valorie Inocente NOVAK, RN Outcome: Progressing 03/10/2024 2050 by Valorie Inocente NOVAK, RN Outcome: Progressing   Problem: Nutritional: Goal: Maintenance of adequate nutrition will improve 03/10/2024 2053 by Valorie Inocente NOVAK, RN Outcome: Progressing 03/10/2024 2050 by Valorie Inocente NOVAK, RN Outcome: Progressing Goal: Progress toward achieving an optimal weight will improve 03/10/2024 2053 by Valorie Inocente NOVAK, RN Outcome: Progressing 03/10/2024 2050 by Valorie Inocente NOVAK, RN Outcome: Progressing   Problem: Skin Integrity: Goal: Risk for impaired skin integrity will decrease 03/10/2024 2053 by Valorie Inocente NOVAK, RN Outcome: Progressing 03/10/2024 2050  by Valorie Inocente NOVAK, RN Outcome: Progressing   Problem: Tissue Perfusion: Goal: Adequacy of tissue perfusion will improve 03/10/2024 2053 by Valorie Inocente NOVAK, RN Outcome: Progressing 03/10/2024 2050 by Valorie Inocente NOVAK, RN Outcome: Progressing   Problem: Education: Goal: Knowledge of General Education information will improve Description: Including pain rating scale, medication(s)/side effects and non-pharmacologic comfort measures 03/10/2024 2053 by Valorie Inocente NOVAK, RN Outcome: Progressing 03/10/2024 2050 by Valorie Inocente NOVAK, RN Outcome: Progressing   Problem: Health Behavior/Discharge Planning: Goal: Ability to manage health-related needs will improve 03/10/2024 2053 by Valorie Inocente NOVAK, RN Outcome: Progressing 03/10/2024 2050 by Valorie Inocente NOVAK, RN Outcome: Progressing   Problem: Clinical Measurements: Goal: Ability to maintain clinical measurements within normal limits will improve 03/10/2024 2053 by Valorie Inocente NOVAK, RN Outcome: Progressing 03/10/2024 2050 by Valorie Inocente NOVAK, RN Outcome: Progressing Goal: Will remain free from infection 03/10/2024 2053 by Valorie Inocente NOVAK, RN Outcome: Progressing 03/10/2024 2050 by Valorie Inocente NOVAK, RN Outcome: Progressing Goal: Diagnostic test results will improve 03/10/2024 2053 by Valorie Inocente NOVAK, RN Outcome: Progressing 03/10/2024 2050 by Valorie Inocente NOVAK, RN Outcome: Progressing Goal: Respiratory complications will improve 03/10/2024 2053 by Valorie Inocente NOVAK, RN Outcome: Progressing 03/10/2024 2050 by Valorie Inocente NOVAK, RN Outcome: Progressing Goal: Cardiovascular complication will be avoided 03/10/2024 2053 by Valorie Inocente NOVAK, RN Outcome: Progressing 03/10/2024 2050 by Valorie Inocente NOVAK, RN Outcome: Progressing   Problem: Activity: Goal: Risk for activity intolerance will decrease 03/10/2024 2053 by Valorie Inocente NOVAK, RN Outcome: Progressing 03/10/2024 2050 by Valorie Inocente NOVAK, RN Outcome: Progressing   Problem:  Nutrition: Goal: Adequate nutrition will be maintained 03/10/2024 2053 by Valorie Inocente NOVAK, RN Outcome: Progressing 03/10/2024 2050 by Valorie Inocente NOVAK, RN Outcome: Progressing   Problem: Coping: Goal: Level of anxiety will decrease 03/10/2024 2053 by Valorie Inocente NOVAK, RN Outcome: Progressing 03/10/2024 2050 by Valorie,  Inocente NOVAK, RN Outcome: Progressing   Problem: Elimination: Goal: Will not experience complications related to bowel motility 03/10/2024 2053 by Valorie Inocente NOVAK, RN Outcome: Progressing 03/10/2024 2050 by Valorie Inocente NOVAK, RN Outcome: Progressing Goal: Will not experience complications related to urinary retention 03/10/2024 2053 by Valorie Inocente NOVAK, RN Outcome: Progressing 03/10/2024 2050 by Valorie Inocente NOVAK, RN Outcome: Progressing   Problem: Pain Managment: Goal: General experience of comfort will improve and/or be controlled 03/10/2024 2053 by Valorie Inocente NOVAK, RN Outcome: Progressing 03/10/2024 2050 by Valorie Inocente NOVAK, RN Outcome: Progressing   Problem: Safety: Goal: Ability to remain free from injury will improve 03/10/2024 2053 by Valorie Inocente NOVAK, RN Outcome: Progressing 03/10/2024 2050 by Valorie Inocente NOVAK, RN Outcome: Progressing   Problem: Skin Integrity: Goal: Risk for impaired skin integrity will decrease 03/10/2024 2053 by Valorie Inocente NOVAK, RN Outcome: Progressing 03/10/2024 2050 by Valorie Inocente NOVAK, RN Outcome: Progressing   Problem: Education: Goal: Knowledge of disease or condition will improve 03/10/2024 2053 by Valorie Inocente NOVAK, RN Outcome: Progressing 03/10/2024 2050 by Valorie Inocente NOVAK, RN Outcome: Progressing Goal: Knowledge of secondary prevention will improve (MUST DOCUMENT ALL) 03/10/2024 2053 by Valorie Inocente NOVAK, RN Outcome: Progressing 03/10/2024 2050 by Valorie Inocente NOVAK, RN Outcome: Progressing Goal: Knowledge of patient specific risk factors will improve (DELETE if not current risk factor) 03/10/2024 2053 by  Valorie Inocente NOVAK, RN Outcome: Progressing 03/10/2024 2050 by Valorie Inocente NOVAK, RN Outcome: Progressing   Problem: Intracerebral Hemorrhage Tissue Perfusion: Goal: Complications of Intracerebral Hemorrhage will be minimized 03/10/2024 2053 by Valorie Inocente NOVAK, RN Outcome: Progressing 03/10/2024 2050 by Valorie Inocente NOVAK, RN Outcome: Progressing   Problem: Coping: Goal: Will verbalize positive feelings about self 03/10/2024 2053 by Valorie Inocente NOVAK, RN Outcome: Progressing 03/10/2024 2050 by Valorie Inocente NOVAK, RN Outcome: Progressing Goal: Will identify appropriate support needs 03/10/2024 2053 by Valorie Inocente NOVAK, RN Outcome: Progressing 03/10/2024 2050 by Valorie Inocente NOVAK, RN Outcome: Progressing   Problem: Health Behavior/Discharge Planning: Goal: Ability to manage health-related needs will improve 03/10/2024 2053 by Valorie Inocente NOVAK, RN Outcome: Progressing 03/10/2024 2050 by Valorie Inocente NOVAK, RN Outcome: Progressing Goal: Goals will be collaboratively established with patient/family 03/10/2024 2053 by Valorie Inocente NOVAK, RN Outcome: Progressing 03/10/2024 2050 by Valorie Inocente NOVAK, RN Outcome: Progressing   Problem: Self-Care: Goal: Ability to participate in self-care as condition permits will improve 03/10/2024 2053 by Valorie Inocente NOVAK, RN Outcome: Progressing 03/10/2024 2050 by Valorie Inocente NOVAK, RN Outcome: Progressing Goal: Verbalization of feelings and concerns over difficulty with self-care will improve 03/10/2024 2053 by Valorie Inocente NOVAK, RN Outcome: Progressing 03/10/2024 2050 by Valorie Inocente NOVAK, RN Outcome: Progressing Goal: Ability to communicate needs accurately will improve 03/10/2024 2053 by Valorie Inocente NOVAK, RN Outcome: Progressing 03/10/2024 2050 by Valorie Inocente NOVAK, RN Outcome: Progressing   Problem: Nutrition: Goal: Risk of aspiration will decrease 03/10/2024 2053 by Valorie Inocente NOVAK, RN Outcome: Progressing 03/10/2024 2050 by Valorie Inocente NOVAK,  RN Outcome: Progressing Goal: Dietary intake will improve 03/10/2024 2053 by Valorie Inocente NOVAK, RN Outcome: Progressing 03/10/2024 2050 by Valorie Inocente NOVAK, RN Outcome: Progressing

## 2024-03-11 DIAGNOSIS — I619 Nontraumatic intracerebral hemorrhage, unspecified: Secondary | ICD-10-CM | POA: Diagnosis not present

## 2024-03-11 DIAGNOSIS — I161 Hypertensive emergency: Secondary | ICD-10-CM | POA: Diagnosis not present

## 2024-03-11 NOTE — Plan of Care (Signed)
   Problem: Coping: Goal: Ability to adjust to condition or change in health will improve Outcome: Progressing

## 2024-03-11 NOTE — Progress Notes (Signed)
  Progress Note   Patient: Amanda [name] FMW:[id number] DOB: 1960/11/24 DOA: 01/03/2024     68 DOS: the patient was seen and examined on 03/11/2024   Brief hospital course: 63 year old female with history of hypertension, cocaine abuse, diabetes type 2 who presented with lethargy, near syncope.  Found to be severely hypertensive, UDS positive for cocaine, with acute intraparenchymal hemorrhage , intraventricular hemorrhage and hydrocephalus.  Patient was admitted to ICU for close blood pressure management.  Neurosurgery consulted.  Status post right frontal ventriculostomy. EVD eventually removed on 10/3. Hospital course remarkable for dysphagia. Dietitian/speech therapy recommending PEG placement. PT/OT recommending SNF on discharge.  S/o PEG placement by IR on 10/16.  Tube feeds stopped 02/18/24, currently on supplements, plan to remove PEG 03/19/24 if oral intake better. On and off agitated and did require restraints. TOC following, placement challenging.   11/22-25: Calmer, alert and oriented.  P.o. intake improving. 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 Continues to receive PT. Plan for discharge to SNF.  EVD removed 01/14/2024, scalp staples removed 02/01/2024.         No reports of agitations recently Still pending bed offer per TOC. Challenging dispo noted Continues to be frustrated about remaining in the hospital   Hypertension:  Continue carvedilol  25 mg twice daily, Entresto .   Amlodipine  discontinued due to history of systolic CHF  BP overall stable   Dysphagia:  Moderate malnutrition- S/P PEG placement on 10/16. Currently on dysphagia 3 diet and eating better, meeting all the nutritional demands, dietitian on board, tube feeds stopped 02/18/2024.  Continue protein supplements.  Plan to remove PEG tube around 03/19/2024 if  continues to meet her nutritional demands.   Chronic combined systolic/diastolic CHF:  continue Entresto  and spironolactone .  Not on aspirin  due to recent hemorrhage.    Type 2 diabetes: Recent A1c of 6.6. Glucose controlled on sliding scale insulin .   Cocaine abuse: Consulted TOC for cocaine cessation resources.   S/p fall- Continue delirium precautions. Restraints for safety prn. Fall precautions. Discussed with PT. Per PT, pt cannot walk on her own and she is impulsive and unaware of her deficits. Not safe for d/c home       Subjective: No complaints this AM  Physical Exam: Vitals:   03/10/24 2033 03/11/24 0200 03/11/24 0500 03/11/24 0741  BP: (!) 145/94 (!) 148/98  (!) 143/79  Pulse: 73 72  74  Resp: 18 16  18   Temp: 97.9 F (36.6 C) 98.2 F (36.8 C)  97.6 F (36.4 C)  TempSrc: Oral Oral  Oral  SpO2: 96% 97%  98%  Weight:   51.4 kg   Height:       General exam: Conversant, in no acute distress Respiratory system: normal chest rise, clear, no audible wheezing Cardiovascular system: regular rhythm, s1-s2 Gastrointestinal system: Nondistended, nontender, pos BS Central nervous system: No seizures, no tremors Extremities: No cyanosis, no joint deformities Skin: No rashes, no pallor Psychiatry: Affect normal // mood seems normal  Data Reviewed:  There are no new results to review at this time.  Family Communication: Pt in room, family not at bedside  Disposition: Status is: Inpatient Remains inpatient appropriate because: severity of illness  Planned Discharge Destination: Skilled nursing facility    Author: Garnette Pelt, MD 03/11/2024 12:47 PM  For on call review [email or web address].

## 2024-03-11 NOTE — Plan of Care (Signed)
   Problem: Education: Goal: Ability to describe self-care measures that may prevent or decrease complications (Diabetes Survival Skills Education) will improve Outcome: Progressing   Problem: Coping: Goal: Ability to adjust to condition or change in health will improve Outcome: Progressing   Problem: Fluid Volume: Goal: Ability to maintain a balanced intake and output will improve Outcome: Progressing

## 2024-03-12 DIAGNOSIS — I619 Nontraumatic intracerebral hemorrhage, unspecified: Secondary | ICD-10-CM | POA: Diagnosis not present

## 2024-03-12 DIAGNOSIS — I161 Hypertensive emergency: Secondary | ICD-10-CM | POA: Diagnosis not present

## 2024-03-12 NOTE — Plan of Care (Signed)
  Problem: Education: Goal: Ability to describe self-care measures that may prevent or decrease complications (Diabetes Survival Skills Education) will improve Outcome: Progressing   Problem: Coping: Goal: Ability to adjust to condition or change in health will improve Outcome: Progressing

## 2024-03-12 NOTE — Progress Notes (Signed)
 Progress Note   Patient: Amanda [name] FMW:[id number] DOB: 11/10/1960 DOA: 01/03/2024     69 DOS: the patient was seen and examined on 03/12/2024   Brief hospital course: 63 year old female with history of hypertension, cocaine abuse, diabetes type 2 who presented with lethargy, near syncope.  Found to be severely hypertensive, UDS positive for cocaine, with acute intraparenchymal hemorrhage , intraventricular hemorrhage and hydrocephalus.  Patient was admitted to ICU for close blood pressure management.  Neurosurgery consulted.  Status post right frontal ventriculostomy. EVD eventually removed on 10/3. Hospital course remarkable for dysphagia. Dietitian/speech therapy recommending PEG placement. PT/OT recommending SNF on discharge.  S/o PEG placement by IR on 10/16.  Tube feeds stopped 02/18/24, currently on supplements, plan to remove PEG 03/19/24 if oral intake better. On and off agitated and did require restraints. TOC following, placement challenging.   11/22-25: Calmer, alert and oriented.  P.o. intake improving. 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 Continues to receive PT. Plan for discharge to SNF.  EVD removed 01/14/2024, scalp staples removed 02/01/2024.         No reports of agitations recently Still pending bed offer per TOC. Challenging dispo noted Continues to be frustrated about remaining in the hospital   Hypertension:  Continue carvedilol  25 mg twice daily, Entresto .   Amlodipine  discontinued due to history of systolic CHF  BP overall stable   Dysphagia:  Moderate malnutrition- S/P PEG placement on 10/16. Currently on dysphagia 3 diet and eating better, meeting all the nutritional demands, dietitian on board, tube feeds stopped 02/18/2024.  Continue protein supplements. -Pt observed eating good portion of lunch  today.  Plan to remove PEG tube around 03/19/2024 if pt continues to meet her nutritional demands.   Chronic combined systolic/diastolic CHF:  continue Entresto  and spironolactone .  Not on aspirin  due to recent hemorrhage.    Type 2 diabetes: Recent A1c of 6.6. Glucose controlled on sliding scale insulin .   Cocaine abuse: Consulted TOC for cocaine cessation resources.   S/p fall- Continue delirium precautions. Restraints for safety prn. Fall precautions. Discussed with PT. Per PT, pt cannot walk on her own and she is impulsive and unaware of her deficits. Not safe for d/c home       Subjective: Without complaints. Tolerating holiday lunch without issues  Physical Exam: Vitals:   03/11/24 0500 03/11/24 0741 03/11/24 2013 03/12/24 0806  BP:  (!) 143/79 (!) 173/106 (!) 138/94  Pulse:  74 71 79  Resp:  18 18 16   Temp:  97.6 F (36.4 C) 97.7 F (36.5 C) 98.3 F (36.8 C)  TempSrc:  Oral  Oral  SpO2:  98%  100%  Weight: 51.4 kg     Height:       General exam: Awake, laying in bed, in nad Respiratory system: Normal respiratory effort, no wheezing Cardiovascular system: regular rate, s1, s2 Gastrointestinal system: Soft, nondistended, positive BS Central nervous system: CN2-12 grossly intact, strength intact Extremities: Perfused, no clubbing Skin: Normal skin turgor, no notable skin lesions seen Psychiatry: Mood normal // no visual hallucinations   Data Reviewed:  There are no new results to review at this time.  Family Communication: Pt in room, family not at bedside  Disposition: Status is: Inpatient Remains inpatient appropriate because: severity of illness  Planned Discharge Destination: Skilled nursing facility    Author: Garnette Pelt, MD 03/12/2024 2:55 PM  For on call  review [email or web address].

## 2024-03-13 DIAGNOSIS — I619 Nontraumatic intracerebral hemorrhage, unspecified: Secondary | ICD-10-CM | POA: Diagnosis not present

## 2024-03-13 DIAGNOSIS — I161 Hypertensive emergency: Secondary | ICD-10-CM | POA: Diagnosis not present

## 2024-03-13 NOTE — Progress Notes (Signed)
 Nutrition Follow-up  DOCUMENTATION CODES:  Non-severe (moderate) malnutrition in context of social or environmental circumstances (polysubstance abuse)  INTERVENTION:  Continue current diet as ordered per SLP Encourage PO intake  ordering assistance Ensure Plus High Protein po TID, each supplement provides 350 kcal and 20 grams of protein MVI with minerals daily   NUTRITION DIAGNOSIS:  Moderate Malnutrition related to social / environmental circumstances (polysubstance abuse) as evidenced by mild muscle depletion, moderate fat depletion, mild fat depletion, moderate muscle depletion. - remains applicable  GOAL:  Patient will meet greater than or equal to 90% of their needs - progressing   MONITOR:  PO intake, Supplement acceptance, Labs, Skin  REASON FOR ASSESSMENT:  Consult Enteral/tube feeding initiation and management  ASSESSMENT:  Pt with hx HTN, CHF, type 2 diabetes, polysubstance abuse, and pulmonary edema. Admitted with AMS, lethargy, and deteriorating condition requiring intubation; diagnosed ICH.  9/22 - admitted, intubated, EVD placed 9/23 - extubated 9/24 - s/p cortrak placement; tip gastric  9/25 - Diet advanced to Dysphagia 2/thin; TF stopped 9/26 - back down to Full liquids, TF resumed  10/13- upgraded to DYS 3, but SLP recommends PEG 10/16 - PEG placed in IR 11/7 - pt meeting nutrition needs s/p kcal count, TF discontinued  Pt resting in recliner at the time of assessment awake, calm, and conversant. Reports she is happy to be watching Amanda [name] today. Pt reports that she is eating like a pig. Continues to consume ensure.   Medically stable to discharge when placement is secured. RD team will continue to follow peripherally while admitted. Please submit a new RD consult if acute needs arise in between assessments.   Admit weight: 56.1 kg  Current weight: 51.4 kg    Average Meal Intake: 10/6-10/8: 0% intake x 4 recorded meals 10/6-10/12: 0% intake x  7 recorded meals 10/14-10/15: 14% x 4 recorded meals 10/24-11/4: 57% intake x 6 recorded meals 11/6-11/11: 79% intake x 4 recorded meals 11/15-11/17: 44% intake x 7 recorded meals 11/27-12/1: 84% intake x 8 recorded meals   Nutritionally Relevant Medications: Scheduled Meds:  Ensure Plus High Protein  237 mL Oral TID BM   free water   50 mL Per Tube Daily   spironolactone   25 mg Oral Daily   PRN Meds: polyethylene glycol  Labs Reviewed: HgbA1c 6.6% (9/22)  Diet Order:   Diet Order             DIET DYS 3 Room service appropriate? Yes with Assist; Fluid consistency: Thin  Diet effective now                  EDUCATION NEEDS:  Not appropriate for education at this time  Skin:  Reviewed RN assessment  Last BM:  11/28 - type 2  Height:  Ht Readings from Last 1 Encounters:  01/03/24 5' 5 (1.651 m)    Weight:  Wt Readings from Last 1 Encounters:  03/11/24 51.4 kg    Ideal Body Weight:  56.8 kg  BMI:  Body mass index is 18.86 kg/m.  Estimated Nutritional Needs:  Kcal:  1500-1700 Protein:  80-90 grams Fluid:  1.5-1.7L   Vernell Lukes, RD, LDN, CNSC Registered Dietitian II Please reach out via secure chat

## 2024-03-13 NOTE — Plan of Care (Signed)
  Problem: Education: Goal: Ability to describe self-care measures that may prevent or decrease complications (Diabetes Survival Skills Education) will improve Outcome: Progressing   Problem: Coping: Goal: Ability to adjust to condition or change in health will improve Outcome: Progressing   Problem: Fluid Volume: Goal: Ability to maintain a balanced intake and output will improve Outcome: Progressing   Problem: Health Behavior/Discharge Planning: Goal: Ability to identify and utilize available resources and services will improve Outcome: Progressing Goal: Ability to manage health-related needs will improve Outcome: Progressing   Problem: Metabolic: Goal: Ability to maintain appropriate glucose levels will improve Outcome: Progressing   Problem: Nutritional: Goal: Maintenance of adequate nutrition will improve Outcome: Progressing Goal: Progress toward achieving an optimal weight will improve Outcome: Progressing   Problem: Skin Integrity: Goal: Risk for impaired skin integrity will decrease Outcome: Progressing   Problem: Tissue Perfusion: Goal: Adequacy of tissue perfusion will improve Outcome: Progressing   Problem: Education: Goal: Knowledge of General Education information will improve Description: Including pain rating scale, medication(s)/side effects and non-pharmacologic comfort measures Outcome: Progressing   Problem: Health Behavior/Discharge Planning: Goal: Ability to manage health-related needs will improve Outcome: Progressing   Problem: Clinical Measurements: Goal: Ability to maintain clinical measurements within normal limits will improve Outcome: Progressing Goal: Will remain free from infection Outcome: Progressing Goal: Diagnostic test results will improve Outcome: Progressing Goal: Respiratory complications will improve Outcome: Progressing Goal: Cardiovascular complication will be avoided Outcome: Progressing   Problem: Activity: Goal:  Risk for activity intolerance will decrease Outcome: Progressing   Problem: Nutrition: Goal: Adequate nutrition will be maintained Outcome: Progressing   Problem: Coping: Goal: Level of anxiety will decrease Outcome: Progressing   Problem: Elimination: Goal: Will not experience complications related to bowel motility Outcome: Progressing Goal: Will not experience complications related to urinary retention Outcome: Progressing   Problem: Pain Managment: Goal: General experience of comfort will improve and/or be controlled Outcome: Progressing   Problem: Safety: Goal: Ability to remain free from injury will improve Outcome: Progressing   Problem: Skin Integrity: Goal: Risk for impaired skin integrity will decrease Outcome: Progressing   Problem: Education: Goal: Knowledge of disease or condition will improve Outcome: Progressing Goal: Knowledge of secondary prevention will improve (MUST DOCUMENT ALL) Outcome: Progressing Goal: Knowledge of patient specific risk factors will improve (DELETE if not current risk factor) Outcome: Progressing   Problem: Intracerebral Hemorrhage Tissue Perfusion: Goal: Complications of Intracerebral Hemorrhage will be minimized Outcome: Progressing   Problem: Coping: Goal: Will verbalize positive feelings about self Outcome: Progressing Goal: Will identify appropriate support needs Outcome: Progressing   Problem: Health Behavior/Discharge Planning: Goal: Ability to manage health-related needs will improve Outcome: Progressing Goal: Goals will be collaboratively established with patient/family Outcome: Progressing   Problem: Self-Care: Goal: Ability to participate in self-care as condition permits will improve Outcome: Progressing Goal: Verbalization of feelings and concerns over difficulty with self-care will improve Outcome: Progressing Goal: Ability to communicate needs accurately will improve Outcome: Progressing   Problem:  Nutrition: Goal: Risk of aspiration will decrease Outcome: Progressing Goal: Dietary intake will improve Outcome: Progressing

## 2024-03-13 NOTE — Progress Notes (Signed)
  Progress Note   Patient: Amanda [name] FMW:[id number] DOB: Dec 09, 1960 DOA: 01/03/2024     70 DOS: the patient was seen and examined on 03/13/2024   Brief hospital course: 63 year old female with history of hypertension, cocaine abuse, diabetes type 2 who presented with lethargy, near syncope.  Found to be severely hypertensive, UDS positive for cocaine, with acute intraparenchymal hemorrhage , intraventricular hemorrhage and hydrocephalus.  Patient was admitted to ICU for close blood pressure management.  Neurosurgery consulted.  Status post right frontal ventriculostomy. EVD eventually removed on 10/3. Hospital course remarkable for dysphagia. Dietitian/speech therapy recommending PEG placement. PT/OT recommending SNF on discharge.  S/o PEG placement by IR on 10/16.  Tube feeds stopped 02/18/24, currently on supplements, plan to remove PEG 03/19/24 if oral intake better. On and off agitated and did require restraints. TOC following, placement challenging.   11/22-25: Calmer, alert and oriented.  P.o. intake improving. 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 Continues to receive PT. Plan for discharge to SNF.  EVD removed 01/14/2024, scalp staples removed 02/01/2024.         No reports of agitations recently Still pending bed offer per TOC. Challenging dispo noted Continues to be frustrated about remaining in the hospital   Hypertension:  Continue carvedilol  25 mg twice daily, Entresto .   Amlodipine  discontinued due to history of systolic CHF  BP overall stable   Dysphagia:  Moderate malnutrition- S/P PEG placement on 10/16. Currently on dysphagia 3 diet and eating better, meeting all the nutritional demands, dietitian on board, tube feeds stopped 02/18/2024.  Continue protein supplements. -Pt observed eating good portion of lunch today.   Plans were noted to remove PEG tube around 03/19/2024 if pt continues to meet her nutritional demands.   Chronic combined systolic/diastolic CHF:  continue Entresto  and spironolactone .  Not on aspirin  due to recent hemorrhage.    Type 2 diabetes: Recent A1c of 6.6. Glucose controlled on sliding scale insulin .   Cocaine abuse: Consulted TOC for cocaine cessation resources.   S/p fall- Continue delirium precautions. Restraints for safety prn. Fall precautions. Discussed with PT. Per PT, pt cannot walk on her own and she is impulsive and unaware of her deficits. Not safe for d/c home       Subjective: No complaints  Physical Exam: Vitals:   03/12/24 0806 03/12/24 1939 03/13/24 0739 03/13/24 1222  BP: (!) 138/94 (!) 160/87 (!) 155/92 (!) 145/83  Pulse: 79 83 73 66  Resp: 16 18 18 18   Temp: 98.3 F (36.8 C) 98.5 F (36.9 C) 97.8 F (36.6 C)   TempSrc: Oral  Oral Axillary  SpO2: 100% 100% 97% 98%  Weight:      Height:       General exam: Conversant, in no acute distress Respiratory system: normal chest rise, clear, no audible wheezing Cardiovascular system: regular rhythm, s1-s2 Gastrointestinal system: Nondistended, nontender, pos BS Central nervous system: No seizures, no tremors Extremities: No cyanosis, no joint deformities Skin: No rashes, no pallor Psychiatry: Affect normal // no auditory hallucinations   Data Reviewed:  There are no new results to review at this time.  Family Communication: Pt in room, family not at bedside  Disposition: Status is: Inpatient Remains inpatient appropriate because: severity of illness  Planned Discharge Destination: Skilled nursing facility    Author: Garnette Pelt, MD 03/13/2024 4:08 PM  For on call review [email or web address].

## 2024-03-14 ENCOUNTER — Inpatient Hospital Stay (HOSPITAL_COMMUNITY)

## 2024-03-14 DIAGNOSIS — I619 Nontraumatic intracerebral hemorrhage, unspecified: Secondary | ICD-10-CM | POA: Diagnosis not present

## 2024-03-14 DIAGNOSIS — I161 Hypertensive emergency: Secondary | ICD-10-CM | POA: Diagnosis not present

## 2024-03-14 HISTORY — PX: IR GASTROSTOMY TUBE REMOVAL: IMG5492

## 2024-03-14 MED ORDER — HALOPERIDOL LACTATE 5 MG/ML IJ SOLN: 5.0000 mg | Freq: Once | INTRAMUSCULAR | Status: DC

## 2024-03-14 NOTE — Procedures (Signed)
 Pre-procedure diagnosis: Poor oral intake, now meeting calorie goals  Post-procedure diagnosis: Same  Successful bedside removal of intact  balloon retention  G-tube. No immediate post procedural complications. EBL <  1 mL  Patient did not remain NPO as instructed and was eating candy upon my arrival which caused stomach contents to immediately exit the insertion site. Discussed with patient and RN - try to remain NPO for 1-2 hours and then start with liquids, if liquids leak from insertion site return to NPO x 1 hour and repeat liquids. Once liquids stop leaking from insertion site may advance diet as tolerated.  Please see imaging section of Epic for full dictation.  Amanda [name] 03/14/2024 1:35 PM

## 2024-03-14 NOTE — Progress Notes (Addendum)
 Amanda [name] appears very anxious today after the removal of PEG tube, getting OOB, wanting to go home, and being noncompliance with care. Staffs are not able to redirect her. Chair and bed alarms activated.

## 2024-03-14 NOTE — Progress Notes (Signed)
 Patient noted very agitated talking to family member asking them to take her home. She is not compliance with safety measures.Patient is sitting in chair with her legs on top of walker at this time. She is unstable on her feet with ambulation. PRN medication given per order.

## 2024-03-14 NOTE — Progress Notes (Signed)
 Occupational Therapy Treatment Patient Details Name: Amanda [name] MRN: [id number] DOB: March 28, 1961 Today's Date: 03/14/2024   History of present illness 63 yo female presents to [facility] on 9/22 with lethargy and near-syncope s/p cocaine and opioid ingestion. 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 S/p R frontal ventriculostomy, burr holes 9/22. ETT 9/22-9/23. PMHx of cocaine abuse, CHF, CKD, chronic cough, HTN and DM2.   OT comments  Patient received in supine and agreeable to OT session. Patient asking to use RW today due to back pain. Patient demonstrating good gains with LB dressing, toilet transfers, and standing at sink for self care.  Patient asked to return to supine at end of session. Patient will benefit from continued inpatient follow up therapy, <3 hours/day.  Acute OT to continue to follow to address established goals to facilitate DC to next venue of care.        If plan is discharge home, recommend the following:  Assistance with cooking/housework;Help with stairs or ramp for entrance;Direct supervision/assist for financial management;Direct supervision/assist for medications management;Assist for transportation;Supervision due to cognitive status;A little help with walking and/or transfers;A little help with bathing/dressing/bathroom   Equipment Recommendations  Other (comment) (defer)    Recommendations for Other Services      Precautions / Restrictions Precautions Precautions: Fall Recall of Precautions/Restrictions: Impaired Precaution/Restrictions Comments: PEG, SBP < 160, impulsive Restrictions Weight Bearing Restrictions Per Provider Order: No       Mobility Bed Mobility Overal bed mobility: Needs Assistance Bed Mobility: Supine to Sit, Sit to Supine     Supine to sit: Contact guard, HOB elevated, Used rails Sit to supine: HOB elevated, Used rails, Contact guard  assist   General bed mobility comments: for trunk    Transfers Overall transfer level: Needs assistance Equipment used: Rolling walker (2 wheels) Transfers: Sit to/from Stand Sit to Stand: Contact guard assist           General transfer comment: patient requesting to use RW on this date and was CGA for safety and balance     Balance Overall balance assessment: Needs assistance Sitting-balance support: No upper extremity supported, Feet supported Sitting balance-Leahy Scale: Fair Sitting balance - Comments: seated EOB   Standing balance support: Reliant on assistive device for balance, Bilateral upper extremity supported, During functional activity Standing balance-Leahy Scale: Poor Standing balance comment: reliant on RW for support                           ADL either performed or assessed with clinical judgement   ADL Overall ADL's : Needs assistance/impaired     Grooming: Wash/dry hands;Wash/dry face;Contact guard assist;Standing;Cueing for sequencing               Lower Body Dressing: Minimal assistance;Sitting/lateral leans Lower Body Dressing Details (indicate cue type and reason): donned paper scrub pants Toilet Transfer: Contact guard assist;Ambulation;Regular Toilet;Rolling walker (2 wheels) Toilet Transfer Details (indicate cue type and reason): cues for safety Toileting- Clothing Manipulation and Hygiene: Minimal assistance;Sitting/lateral lean         General ADL Comments: cues for sequencing and safety    Extremity/Trunk Assessment              Vision       Perception     Praxis     Communication Communication Communication: No apparent difficulties   Cognition Arousal: Alert Behavior During Therapy: Impulsive Cognition: Cognition impaired  Orientation impairments: Time, Situation Awareness: Intellectual awareness impaired Memory impairment (select all impairments): Short-term memory, Working memory Attention  impairment (select first level of impairment): Sustained attention Executive functioning impairment (select all impairments): Initiation, Sequencing, Reasoning, Problem solving                   Following commands: Impaired Following commands impaired: Follows one step commands with increased time      Cueing   Cueing Techniques: Verbal cues, Visual cues, Gestural cues  Exercises      Shoulder Instructions       General Comments performed functional mobility in hallway with CGA and min verbal cues for safety and to navigate back to room    Pertinent Vitals/ Pain       Pain Assessment Pain Assessment: Faces Faces Pain Scale: Hurts a little bit Pain Location: back Pain Descriptors / Indicators: Aching Pain Intervention(s): Monitored during session, Repositioned  Home Living                                          Prior Functioning/Environment              Frequency  Min 2X/week        Progress Toward Goals  OT Goals(current goals can now be found in the care plan section)  Progress towards OT goals: Progressing toward goals  Acute Rehab OT Goals Patient Stated Goal: did not state OT Goal Formulation: With patient Time For Goal Achievement: 03/21/24 Potential to Achieve Goals: Fair ADL Goals Pt Will Perform Eating: with set-up;sitting Pt Will Perform Grooming: with supervision;standing Pt Will Transfer to Toilet: with supervision;ambulating;regular height toilet Additional ADL Goal #1: Pt will attend to items on L side and properly orient self to sink/task at hand 75% of the time, in preparation for ADLs performed in standing. Additional ADL Goal #2: Pt will follow 2-step commands 50% of the time during ADLs and functional mobility to demonstrate improved attention.  Plan      Co-evaluation                 AM-PAC OT 6 Clicks Daily Activity     Outcome Measure   Help from another person eating meals?: A Little Help  from another person taking care of personal grooming?: A Little Help from another person toileting, which includes using toliet, bedpan, or urinal?: A Lot Help from another person bathing (including washing, rinsing, drying)?: A Little Help from another person to put on and taking off regular upper body clothing?: A Little Help from another person to put on and taking off regular lower body clothing?: A Lot 6 Click Score: 16    End of Session Equipment Utilized During Treatment: Gait belt;Rolling walker (2 wheels)  OT Visit Diagnosis: Unsteadiness on feet (R26.81);Other abnormalities of gait and mobility (R26.89);Muscle weakness (generalized) (M62.81)   Activity Tolerance Patient tolerated treatment well   Patient Left in bed;with call bell/phone within reach;with bed alarm set   Nurse Communication Mobility status        Time: 9172-9146 OT Time Calculation (min): 26 min  Charges: OT General Charges $OT Visit: 1 Visit OT Treatments $Self Care/Home Management : 8-22 mins $Therapeutic Activity: 8-22 mins  Dick Laine, OTA Acute Rehabilitation Services  Office [phone]   Jeb LITTIE Laine 03/14/2024, 11:54 AM

## 2024-03-14 NOTE — Progress Notes (Addendum)
 1945 patient tried to leave unit to find and go home with Rutha. Attempted to swing at staff. Patient was then redirected to room, given evening Seroquel  and a snack.   2200 patient toileted with assistance & refused night time meds. She would not allow this RN to cleanse and apply Aquaphor to feet and would not take oral Entresto . Go away no   Bed alarm on & call light within reach.

## 2024-03-14 NOTE — Progress Notes (Signed)
 Physical Therapy Treatment Patient Details Name: Amanda [name] MRN: [id number] DOB: June 11, 1960 Today's Date: 03/14/2024   History of Present Illness 63 yo female presents to [facility] on 9/22 with lethargy and near-syncope s/p cocaine and opioid ingestion. 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 S/p R frontal ventriculostomy, burr holes 9/22. ETT 9/22-9/23. PMHx of cocaine abuse, CHF, CKD, chronic cough, HTN and DM2.    PT Comments  Pt received in supine and agreeable to PT session. Pt CGA for all mobility today, except one LOB requiring minA to maintain upright. Able to ambulate 120' today, attempting training with both quad cane and SPC. Demonstrates steadiness initially with both quad cane and SPC, but as distance inc, demonstrated slowed walking speed and inc trunk flexion despite verbal cueing to stand tall. Ultimately she states she would like to continue walking with the RW because she can go fast with it. She required 2 sitting and 2 standing rest breaks throughout ambulation today, and pt easily distracted and agitated with redirection back to therapy tasks. Continuing to recommend psot-acute rehab <3hrs/day to improve activity tolerance and safety. Acute PT to follow.     If plan is discharge home, recommend the following: Supervision due to cognitive status;Assist for transportation;Help with stairs or ramp for entrance;Assistance with cooking/housework;Direct supervision/assist for medications management;Direct supervision/assist for financial management;A lot of help with walking and/or transfers;A lot of help with bathing/dressing/bathroom   Can travel by private vehicle     No  Equipment Recommendations  Rolling walker (2 wheels);BSC/3in1    Recommendations for Other Services       Precautions / Restrictions Precautions Precautions: Fall Recall of Precautions/Restrictions:  Impaired Precaution/Restrictions Comments: PEG, SBP < 160, impulsive Restrictions Weight Bearing Restrictions Per Provider Order: No     Mobility  Bed Mobility Overal bed mobility: Needs Assistance Bed Mobility: Supine to Sit, Sit to Supine     Supine to sit: Contact guard, HOB elevated Sit to supine: Contact guard assist, HOB elevated        Transfers Overall transfer level: Needs assistance Equipment used: Quad cane Transfers: Sit to/from Stand Sit to Stand: Contact guard assist           General transfer comment: Discussed proper quad cane techniques prior to standing, and pt demonstrates slow power up into standing. Overall steady once in standing    Ambulation/Gait Ambulation/Gait assistance: Contact guard assist, Min assist Gait Distance (Feet): 120 Feet Assistive device: Straight cane, Quad cane Gait Pattern/deviations: Step-through pattern, Decreased stride length, Trunk flexed, Staggering right Gait velocity: Dec Gait velocity interpretation: <1.8 ft/sec, indicate of risk for recurrent falls   General Gait Details: Initiated gait with quad cane, which pt appeared steady with. Attempted gait with SPC, which pt remained steady but with slower gait than quad cane. Pt frequently requesting to switch back and forth between quad cane and SPC, but utlimately stated at end of session she prefers the RW because she can go fast with it. She required 2 standing and 2 seated rest breaks, seemingly more due to distractability than need for rest. This therapist had difficulty redirecting pt back to room. As distance inc, pt demonstrates inc trunk flexion, which she can correct but does not mainain. Had one LOB with SPC when entering room, which required minA to maintain upright.   Stairs             Wheelchair Mobility     Tilt Bed  Modified Rankin (Stroke Patients Only) Modified Rankin (Stroke Patients Only) Pre-Morbid Rankin Score: No symptoms Modified  Rankin: Moderately severe disability     Balance Overall balance assessment: Needs assistance Sitting-balance support: No upper extremity supported, Feet supported Sitting balance-Leahy Scale: Fair Sitting balance - Comments: seated EOB   Standing balance support: Reliant on assistive device for balance, During functional activity, Single extremity supported Standing balance-Leahy Scale: Poor Standing balance comment: Reliant on SPC or quad cane for support                            Communication Communication Communication: No apparent difficulties  Cognition Arousal: Alert Behavior During Therapy: Impulsive   PT - Cognitive impairments: Safety/Judgement, Problem solving, Attention, Awareness                       PT - Cognition Comments: Pt easily distracted requiring frequent redirection and cues for attention. Was intermittently agitated when cues given to continue walking and stand tall. Following commands: Impaired Following commands impaired: Follows one step commands with increased time    Cueing Cueing Techniques: Verbal cues, Gestural cues  Exercises      General Comments General comments (skin integrity, edema, etc.): Appears that behavioral tendencies play a role in limiting mobility, as pt's agitation inc more quickly as session went on during attempts to educate on gait techniques and to redirect to therapy tasks      Pertinent Vitals/Pain Pain Assessment Pain Assessment: Faces Faces Pain Scale: No hurt Pain Intervention(s): Monitored during session    Home Living                          Prior Function            PT Goals (current goals can now be found in the care plan section) Acute Rehab PT Goals PT Goal Formulation: With patient Time For Goal Achievement: 03/28/24 Potential to Achieve Goals: Fair Progress towards PT goals: Progressing toward goals    Frequency    Min 2X/week      PT Plan       Co-evaluation              AM-PAC PT 6 Clicks Mobility   Outcome Measure  Help needed turning from your back to your side while in a flat bed without using bedrails?: A Little Help needed moving from lying on your back to sitting on the side of a flat bed without using bedrails?: A Little Help needed moving to and from a bed to a chair (including a wheelchair)?: A Little Help needed standing up from a chair using your arms (e.g., wheelchair or bedside chair)?: A Little Help needed to walk in hospital room?: A Little Help needed climbing 3-5 steps with a railing? : A Little 6 Click Score: 18    End of Session Equipment Utilized During Treatment: Gait belt Activity Tolerance: Patient tolerated treatment well;Patient limited by fatigue Patient left: in bed;with call bell/phone within reach;with bed alarm set Nurse Communication: Mobility status PT Visit Diagnosis: Unsteadiness on feet (R26.81);Difficulty in walking, not elsewhere classified (R26.2);Other abnormalities of gait and mobility (R26.89);Other symptoms and signs involving the nervous system (R29.898);Muscle weakness (generalized) (M62.81)     Time: 8850-8786 PT Time Calculation (min) (ACUTE ONLY): 24 min  Charges:    $Gait Training: 8-22 mins $Therapeutic Activity: 8-22 mins PT General Charges $$ ACUTE PT VISIT:  1 Visit                     Amanda [name], SPT    Amanda [name] 03/14/2024, 1:33 PM

## 2024-03-14 NOTE — Progress Notes (Signed)
  Progress Note   Patient: Amanda [name] FMW:[id number] DOB: 12/10/1960 DOA: 01/03/2024     71 DOS: the patient was seen and examined on 03/14/2024   Brief hospital course: 63 year old female with history of hypertension, cocaine abuse, diabetes type 2 who presented with lethargy, near syncope.  Found to be severely hypertensive, UDS positive for cocaine, with acute intraparenchymal hemorrhage , intraventricular hemorrhage and hydrocephalus.  Patient was admitted to ICU for close blood pressure management.  Neurosurgery consulted.  Status post right frontal ventriculostomy. EVD eventually removed on 10/3. Hospital course remarkable for dysphagia. Dietitian/speech therapy recommending PEG placement. PT/OT recommending SNF on discharge.  S/o PEG placement by IR on 10/16.  Tube feeds stopped 02/18/24, currently on supplements, plan to remove PEG 03/19/24 if oral intake better. On and off agitated and did require restraints. TOC following, placement challenging.   11/22-25: Calmer, alert and oriented.  P.o. intake improving. 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 Continues to receive PT. Plan for discharge to SNF.  EVD removed 01/14/2024, scalp staples removed 02/01/2024.         No reports of agitations recently Still pending bed offer per TOC. Challenging dispo noted Continues to be frustrated about remaining in the hospital   Hypertension:  Continue carvedilol  25 mg twice [name], Entresto .   Amlodipine  discontinued due to history of systolic CHF  BP overall stable   Dysphagia:  Moderate malnutrition- S/P PEG placement on 10/16. Currently on dysphagia 3 diet and eating better, meeting all the nutritional demands, dietitian on board, tube feeds stopped 02/18/2024.  Continue protein supplements. -Pt observed eating well -PEG removed 03/14/24    Chronic combined systolic/diastolic CHF:  continue Entresto  and spironolactone .  Not on aspirin  due to recent hemorrhage.    Type 2 diabetes: Recent A1c of 6.6. Glucose controlled on sliding scale insulin .   Cocaine abuse: Consulted TOC for cocaine cessation resources.   S/p fall- Continue delirium precautions. Restraints for safety prn. Fall precautions. Discussed with PT. Per PT, pt cannot walk on her own and she is impulsive and unaware of her deficits. Not safe for d/c home       Subjective: Very eager to go home, but does not seem to grasp at how weak and unsteady she is  Physical Exam: Vitals:   03/13/24 1222 03/13/24 1616 03/13/24 1936 03/14/24 0822  BP: (!) 145/83 (!) 153/98 (!) 156/95 114/81  Pulse: 66 77 76 84  Resp: 18 18 18 18   Temp:  98 F (36.7 C) 98.3 F (36.8 C) 98.4 F (36.9 C)  TempSrc: Axillary Axillary Oral Oral  SpO2: 98% 99% 98% 97%  Weight:      Height:       General exam: Awake, laying in bed, in nad Respiratory system: Normal respiratory effort, no wheezing Cardiovascular system: regular rate, s1, s2 Gastrointestinal system: Soft, nondistended, positive BS Central nervous system: CN2-12 grossly intact, strength intact Extremities: Perfused, no clubbing Skin: Normal skin turgor, no notable skin lesions seen Psychiatry: Mood normal // affect normal  Data Reviewed:  There are no new results to review at this time.  Family Communication: Pt in room, family not at bedside  Disposition: Status is: Inpatient Remains inpatient appropriate because: severity of illness  Planned Discharge Destination: Skilled nursing facility    Author: Garnette Pelt, MD 03/14/2024 3:53 PM  For on call review [email or web address].

## 2024-03-14 NOTE — Plan of Care (Signed)
  Problem: Education: Goal: Ability to describe self-care measures that may prevent or decrease complications (Diabetes Survival Skills Education) will improve Outcome: Progressing   Problem: Coping: Goal: Ability to adjust to condition or change in health will improve Outcome: Progressing   Problem: Fluid Volume: Goal: Ability to maintain a balanced intake and output will improve Outcome: Progressing   Problem: Health Behavior/Discharge Planning: Goal: Ability to identify and utilize available resources and services will improve Outcome: Progressing   Problem: Nutritional: Goal: Maintenance of adequate nutrition will improve Outcome: Progressing

## 2024-03-15 DIAGNOSIS — I619 Nontraumatic intracerebral hemorrhage, unspecified: Secondary | ICD-10-CM | POA: Diagnosis not present

## 2024-03-15 DIAGNOSIS — I161 Hypertensive emergency: Secondary | ICD-10-CM | POA: Diagnosis not present

## 2024-03-15 DIAGNOSIS — E440 Moderate protein-calorie malnutrition: Secondary | ICD-10-CM | POA: Diagnosis not present

## 2024-03-15 DIAGNOSIS — F1410 Cocaine abuse, uncomplicated: Secondary | ICD-10-CM | POA: Diagnosis not present

## 2024-03-15 LAB — COMPREHENSIVE METABOLIC PANEL WITH GFR
ALT: 27 U/L (ref 0–44)
AST: 18 U/L (ref 15–41)
Albumin: 3 g/dL — ABNORMAL LOW (ref 3.5–5.0)
Alkaline Phosphatase: 51 U/L (ref 38–126)
Anion gap: 13 (ref 5–15)
BUN: 16 mg/dL (ref 8–23)
CO2: 26 mmol/L (ref 22–32)
Calcium: 9.8 mg/dL (ref 8.9–10.3)
Chloride: 105 mmol/L (ref 98–111)
Creatinine, Ser: 0.87 mg/dL (ref 0.44–1.00)
GFR, Estimated: >60 mL/min (ref >60)
Glucose, Bld: 89 mg/dL (ref 70–99)
Potassium: 3.7 mmol/L (ref 3.5–5.1)
Sodium: 144 mmol/L (ref 135–145)
Total Bilirubin: 0.6 mg/dL (ref 0.0–1.2)
Total Protein: 6.7 g/dL (ref 6.5–8.1)

## 2024-03-15 LAB — CBC
HCT: 36.5 % (ref 36.0–46.0)
Hemoglobin: 12 g/dL (ref 12.0–15.0)
MCH: 28.9 pg (ref 26.0–34.0)
MCHC: 32.9 g/dL (ref 30.0–36.0)
MCV: 88 fL (ref 80.0–100.0)
Platelets: 228 K/uL (ref 150–400)
RBC: 4.15 MIL/uL (ref 3.87–5.11)
RDW: 15.6 % — ABNORMAL HIGH (ref 11.5–15.5)
WBC: 6.9 K/uL (ref 4.0–10.5)
nRBC: 0 % (ref 0.0–0.2)

## 2024-03-15 LAB — MAGNESIUM: Magnesium: 1.9 mg/dL (ref 1.7–2.4)

## 2024-03-15 MED ORDER — QUETIAPINE FUMARATE 25 MG PO TABS
25.0000 mg | ORAL_TABLET | Freq: Every day | ORAL | Status: DC
  Administered 2024-03-15 – 2024-04-18 (×34): 25 mg via ORAL
  Filled 2024-03-15 (×29): qty 1

## 2024-03-15 NOTE — Progress Notes (Signed)
 [facility]                                                                              Amanda [name], is a 63 y.o. female, DOB - 18-Feb-1961, FMW:[id number] Admit date - 01/03/2024    Outpatient Primary MD for the Amanda [name] is Amanda [name], No Pcp Per  LOS - 72  days  Chief Complaint  Amanda [name] presents with   Altered Mental Status       Brief summary   63 year old female with history of hypertension, cocaine abuse, diabetes type 2 who presented with lethargy, near syncope. Found to be severely hypertensive, UDS positive for cocaine, with acute intraparenchymal hemorrhage , intraventricular hemorrhage and hydrocephalus. Amanda [name] was admitted to ICU for close blood pressure management. Neurosurgery consulted. Status post right frontal ventriculostomy. EVD eventually removed on 10/3. Hospital course remarkable for dysphagia. Dietitian/speech therapy recommending PEG placement. PT/OT recommending SNF on discharge. S/o PEG placement by IR on 10/16. Tube feeds stopped 02/18/24, currently on supplements, plan to remove PEG 03/19/24 if oral intake better. On and off agitated and did require restraints.   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         - Pending placement   Hypertension:  BP stable, continue Coreg , Entresto   Amlodipine  discontinued due to history of systolic CHF     Dysphagia:  Moderate malnutrition- S/P PEG placement on 10/16. Currently on dysphagia 3 diet and eating better, meeting all the nutritional demands, dietitian on board, tube feeds stopped 02/18/2024.  Continue protein supplements. -PEG removed on 03/14/24 -Continue to encourage p.o. diet/dysphagia 3   Chronic combined systolic/diastolic CHF:  -Continue Entresto , spironolactone , Coreg   - Not on  aspirin  due to recent intraparenchymal hemorrhage     Type 2 diabetes:  Recent A1c of 6.6.   Cocaine abuse:  Consulted TOC for cocaine cessation resources.   S/p fall- -Continue fall, delirium precautions. -Continue sitter  - Per PT, pt cannot walk on her own and she is impulsive and unaware of her deficits. Not safe for d/c home   Moderate protein calorie malnutrition Nutrition Problem: Moderate Malnutrition Etiology: social / environmental circumstances (polysubstance abuse) Signs/Symptoms: mild muscle depletion, moderate fat depletion, mild fat depletion, moderate muscle depletion Interventions: Ensure Enlive (each supplement provides 350kcal and 20 grams of protein), MVI  Estimated body mass index is 19.44 kg/m as calculated from the following:   Height as of this encounter: 5' 5 (1.651 m).   Weight as of this encounter: 53 kg.  Code Status: Full code DVT Prophylaxis:  enoxaparin  (LOVENOX ) injection 40 mg Start: 02/21/24 1430 Place and maintain sequential compression device Start: 01/03/24 0611 SCDs Start: 01/03/24 0429   Level of Care: Level of care: Med-Surg Family Communication:  Disposition Plan:      Remains inpatient appropriate: Awaiting placement   Procedures:    Consultants:   Neurology  Antimicrobials:   Anti-infectives (From admission, onward)    Start     Dose/Rate Route Frequency Ordered Stop  01/27/24 0807  ceFAZolin  (ANCEF ) IVPB 2g/100 mL premix        over 30 Minutes Intravenous Continuous PRN 01/27/24 0807 01/27/24 0807   01/13/24 0600  ceFAZolin  (ANCEF ) IVPB 2g/100 mL premix        2 g 200 mL/hr over 30 Minutes Intravenous On call to O.R. 01/11/24 2300 01/13/24 0557          Medications  carvedilol   25 mg Oral BID WC   enoxaparin  (LOVENOX ) injection  40 mg Subcutaneous Daily   feeding supplement  237 mL Oral TID BM   free water   50 mL Per Tube Daily   mineral oil-hydrophilic petrolatum    Topical BID   QUEtiapine   25 mg Oral QHS    sacubitril -valsartan   1 tablet Oral BID   spironolactone   25 mg Oral Daily      Subjective:   Osceola Holian was seen and examined today.  Wants to go home and then come back.  Somewhat confused.  No acute issues overnight.  Objective:   Vitals:   03/15/24 0453 03/15/24 0500 03/15/24 0829 03/15/24 1134  BP: 125/89  (!) 137/94 119/79  Pulse: 67  85 79  Resp: 16  18 20   Temp: 97.6 F (36.4 C)  98.5 F (36.9 C) 98.1 F (36.7 C)  TempSrc: Oral  Oral Oral  SpO2: 96%  100% 98%  Weight:  53 kg    Height:        Intake/Output Summary (Last 24 hours) at 03/15/2024 1440 Last data filed at 03/15/2024 1236 Gross per 24 hour  Intake 480 ml  Output --  Net 480 ml     Wt Readings from Last 3 Encounters:  03/15/24 53 kg  11/15/23 58.5 kg  05/14/23 58.4 kg     Exam General: Alert and oriented x 3, NAD Cardiovascular: S1 S2 auscultated,  RRR Respiratory: Clear to auscultation bilaterally, no wheezing Gastrointestinal: Soft, nontender, nondistended, + bowel sounds Ext: no pedal edema bilaterally Neuro: Moving all 4 extremities Psych: Confused   Data Reviewed:  I have personally reviewed following labs    CBC Lab Results  Component Value Date   WBC 6.9 03/15/2024   RBC 4.15 03/15/2024   HGB 12.0 03/15/2024   HCT 36.5 03/15/2024   MCV 88.0 03/15/2024   MCH 28.9 03/15/2024   PLT 228 03/15/2024   MCHC 32.9 03/15/2024   RDW 15.6 (H) 03/15/2024   LYMPHSABS 1.8 01/15/2024   MONOABS 0.9 01/15/2024   EOSABS 0.0 01/15/2024   BASOSABS 0.0 01/15/2024     Last metabolic panel Lab Results  Component Value Date   NA 144 03/15/2024   K 3.7 03/15/2024   CL 105 03/15/2024   CO2 26 03/15/2024   BUN 16 03/15/2024   CREATININE 0.87 03/15/2024   GLUCOSE 89 03/15/2024   GFRNONAA >60 03/15/2024   GFRAA >90 01/21/2014   CALCIUM 9.8 03/15/2024   PHOS 4.1 01/21/2024   PROT 6.7 03/15/2024   ALBUMIN 3.0 (L) 03/15/2024   LABGLOB 3.6 11/15/2023   BILITOT 0.6 03/15/2024    ALKPHOS 51 03/15/2024   AST 18 03/15/2024   ALT 27 03/15/2024   ANIONGAP 13 03/15/2024    CBG (last 3)  No results for input(s): GLUCAP in the last 72 hours.    Coagulation Profile: No results for input(s): INR, PROTIME in the last 168 hours.   Radiology Studies: I have personally reviewed the imaging studies  IR GASTROSTOMY TUBE REMOVAL/REPAIR Result Date: 03/14/2024 INDICATION: Amanda [name] with history of  CVA, dysphagia s/p 20 Fr pull through gastrostomy placement now tolerating appropriate oral intake. Request for removal of gastrostomy tube. EXAM: BEDSIDE REMOVAL OF GASTROSTOMY TUBE COMPARISON:  None Available. MEDICATIONS: None. CONTRAST:  None FLUOROSCOPY TIME:  None COMPLICATIONS: None immediate. PROCEDURE: A time-out was performed prior to the initiation of the procedure. 8 cc of fluid was removed from the retention balloon and the gastrostomy tube was removed in tact without complication. A dressing was placed. The Amanda [name] tolerated the procedure well without immediate postprocedural complication. IMPRESSION: Successful bedside removal of balloon retention gastrostomy tube without complication Performed by Clotilda Hesselbach, PA-C Electronically Signed   By: CHRISTELLA.  Shick M.D.   On: 03/14/2024 14:44       Idil Maslanka M.D. [facility] 03/15/2024, 2:40 PM  Available via Epic secure chat 7am-7pm After 7 pm, please refer to night coverage provider listed on amion.

## 2024-03-15 NOTE — Plan of Care (Signed)
  Problem: Coping: Goal: Ability to adjust to condition or change in health will improve Outcome: Progressing   Problem: Health Behavior/Discharge Planning: Goal: Ability to identify and utilize available resources and services will improve Outcome: Progressing Goal: Ability to manage health-related needs will improve Outcome: Progressing   Problem: Skin Integrity: Goal: Risk for impaired skin integrity will decrease Outcome: Progressing

## 2024-03-15 NOTE — TOC Progression Note (Signed)
 Transition of Care [facility]) - Progression Note    Patient Details  Name: Amanda [name] MRN: [id number] Date of Birth: 1960/04/19  [facility] CM/SW Contact  Amanda [name], Amanda [name] Phone Number: 03/15/2024, 10:48 AM  Clinical Narrative:   CSW following for disposition, patient continues without bed offers. Patient agitated this afternoon, telling nursing that her daughter is going to come and pick her up. CSW spoke with daughter, Amanda [name], to discuss. [name] lives in [place]  and works full time, she would not be able to provide 24/7 assistance for the patient. Per Amanda [name], the patient's significant other also works but they could maybe cobble together support for patient to go home if that was recommended for her. CSW explained continued recommendation for SNF and discussed in more detail about the patient's deficits and care needs, and Amanda [name] reported that the family would not be able to manage that and she would still need placement, even though the patient is not happy about it. CSW updated RN. CSW to follow.    Expected Discharge Plan: Skilled Nursing Facility Barriers to Discharge: English As A Second Language Teacher, Continued Medical Work up, Inadequate or no insurance, Facility will not accept until restraint criteria met, SNF Pending bed offer               Expected Discharge Plan and Services In-house Referral: Clinical Social Work   Post Acute Care Choice: Skilled Nursing Facility Living arrangements for the past 2 months: Single Family Home                                       Social Drivers of Health (SDOH) Interventions SDOH Screenings   Food Insecurity: No Food Insecurity (07/16/2023)   Received from [facility]  Housing: Low Risk  (07/16/2023)   Received from [facility]  Transportation Needs: No Transportation Needs (07/16/2023)   Received from [facility]  Utilities: Not At Risk (07/16/2023)   Received from [facility]  Alcohol Screen:  Low Risk  (05/14/2023)  Financial Resource Strain: Low Risk  (07/16/2023)   Received from [facility]  Tobacco Use: High Risk (01/26/2024)    Readmission Risk Interventions    03/26/2022   10:50 AM  Readmission Risk Prevention Plan  Transportation Screening Complete  HRI or Home Care Consult Complete  Social Work Consult for Recovery Care Planning/Counseling Complete  Palliative Care Screening Not Applicable  Medication Review Oceanographer) Referral to Pharmacy

## 2024-03-15 NOTE — Progress Notes (Signed)
 Amanda [name] appears upset wanting to go home today, noncompliance with care. Refusing her lovenox  sq verbalizing that she scared of needle and don't want no damn needle. She is a high fall risk and had fallen during this admission. Please see safety order in placed

## 2024-03-15 NOTE — Progress Notes (Signed)
 Speech Language Pathology Treatment: Cognitive-Linguistic  Patient Details Name: Amanda [name] MRN: [id number] DOB: [birth year] Today's Date: 03/15/2024 Time: 8361-8342 SLP Time Calculation (min) (ACUTE ONLY): 19 min  Assessment / Plan / Recommendation   Clinical Impression   SLP will continue to follow. Plan for reassessment of cognition next visit. Patient seen by SLP for skilled treatment focused on cognitive-linguistic function. She demonstrated recall of recent events from yesterday during which she tried to leave the hospital. Patient told SLP that MD who took her PEG out yesterday told her she could go home so she started to pack up her things. SLP reviewed plan which is for patient to discharge to SNF. She did recall this and that she had agreed to it. She also was oriented to time and aware that she has been here for two months so far.    HPI HPI: [name] with polysubstance/cocaine abuse 3 hrs before presentation, who called EMS for AMS, lethargy and near syncope. Her condition deteriorated (unable to protect her airway and more lethargic) and got intubated. 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 S/o PEG placement by IR on 10/16.  Tube feeds stopped 02/18/24, currently on supplements, plan to remove PEG 03/19/24 if oral intake better. HTN, CHF, DM-2.      SLP Plan  Continue with current plan of care        Swallow Evaluation Recommendations    SLP at next venue of care     Recommendations                         Frequent or constant Supervision/Assistance Cognitive communication deficit (M58.158)     Continue with current plan of care     Norleen IVAR Blase, MA, CCC-SLP Speech Therapy   03/15/2024, 5:27 PM

## 2024-03-16 ENCOUNTER — Inpatient Hospital Stay (HOSPITAL_COMMUNITY)

## 2024-03-16 DIAGNOSIS — F1410 Cocaine abuse, uncomplicated: Secondary | ICD-10-CM | POA: Diagnosis not present

## 2024-03-16 DIAGNOSIS — I161 Hypertensive emergency: Secondary | ICD-10-CM | POA: Diagnosis not present

## 2024-03-16 DIAGNOSIS — I619 Nontraumatic intracerebral hemorrhage, unspecified: Secondary | ICD-10-CM | POA: Diagnosis not present

## 2024-03-16 DIAGNOSIS — E440 Moderate protein-calorie malnutrition: Secondary | ICD-10-CM | POA: Diagnosis not present

## 2024-03-16 NOTE — Progress Notes (Signed)
 [facility]                                                                              Amanda [name], is a 63 y.o. female, DOB - Apr 21, 1960, FMW:[id number] Admit date - 01/03/2024    Outpatient Primary MD for the patient is Patient, No Pcp Per  LOS - 73  days  Chief Complaint  Patient presents with   Altered Mental Status       Brief summary   63 year old female with history of hypertension, cocaine abuse, diabetes type 2 who presented with lethargy, near syncope. Found to be severely hypertensive, UDS positive for cocaine, with acute intraparenchymal hemorrhage , intraventricular hemorrhage and hydrocephalus. Patient was admitted to ICU for close blood pressure management. Neurosurgery consulted. Status post right frontal ventriculostomy. EVD eventually removed on 10/3. Hospital course remarkable for dysphagia. Dietitian/speech therapy recommending PEG placement. PT/OT recommending SNF on discharge. S/o PEG placement by IR on 10/16. Tube feeds stopped 02/18/24, currently on supplements, plan to remove PEG 03/19/24 if oral intake better. On and off agitated and did require restraints.   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         - Pending placement   Hypertension:  BP stable, continue Coreg , Entresto   Amlodipine  discontinued due to history of systolic CHF     Dysphagia:  Moderate malnutrition- S/P PEG placement on 10/16. Currently on dysphagia 3 diet and eating better, meeting all the nutritional demands, dietitian on board, tube feeds stopped 02/18/2024.  Continue protein supplements. -PEG removed on 03/14/24 -Continue to encourage p.o. diet/dysphagia 3   Chronic combined systolic/diastolic CHF:  -Continue Entresto , spironolactone , Coreg   - Not on  aspirin  due to recent intraparenchymal hemorrhage     Type 2 diabetes:  Recent A1c of 6.6.   Cocaine abuse:  Consulted TOC for cocaine cessation resources.   S/p fall- -Continue fall, delirium precautions. -Continue sitter  - Per PT, pt cannot walk on her own and she is impulsive and unaware of her deficits. Not safe for d/c home - Continue Seroquel   Moderate protein calorie malnutrition Nutrition Problem: Moderate Malnutrition Etiology: social / environmental circumstances (polysubstance abuse) Signs/Symptoms: mild muscle depletion, moderate fat depletion, mild fat depletion, moderate muscle depletion Interventions: Ensure Enlive (each supplement provides 350kcal and 20 grams of protein), MVI  Estimated body mass index is 19.04 kg/m as calculated from the following:   Height as of this encounter: 5' 5 (1.651 m).   Weight as of this encounter: 51.9 kg.  Code Status: Full code DVT Prophylaxis:  enoxaparin  (LOVENOX ) injection 40 mg Start: 02/21/24 1430 Place and maintain sequential compression device Start: 01/03/24 0611 SCDs Start: 01/03/24 0429   Level of Care: Level of care: Med-Surg Family Communication:  Disposition Plan:      Remains inpatient appropriate: No acute medical issues, awaiting placement   Procedures:    Consultants:   Neurology  Antimicrobials:   Anti-infectives (From admission, onward)    Start  Dose/Rate Route Frequency Ordered Stop   01/27/24 0807  ceFAZolin  (ANCEF ) IVPB 2g/100 mL premix        over 30 Minutes Intravenous Continuous PRN 01/27/24 0807 01/27/24 0807   01/13/24 0600  ceFAZolin  (ANCEF ) IVPB 2g/100 mL premix        2 g 200 mL/hr over 30 Minutes Intravenous On call to O.R. 01/11/24 2300 01/13/24 0557          Medications  carvedilol   25 mg Oral BID WC   enoxaparin  (LOVENOX ) injection  40 mg Subcutaneous Daily   feeding supplement  237 mL Oral TID BM   free water   50 mL Per Tube Daily   mineral oil-hydrophilic  petrolatum    Topical BID   QUEtiapine   25 mg Oral QAC supper   sacubitril -valsartan   1 tablet Oral BID   spironolactone   25 mg Oral Daily      Subjective:   Amanda [name] was seen and examined today.  Confused, no acute issues overnight.  No chest pain or shortness of breath.   Objective:   Vitals:   03/15/24 1938 03/16/24 0352 03/16/24 0500 03/16/24 0755  BP: 122/81 (!) 154/84  138/88  Pulse: 80 69  69  Resp: 17 18  18   Temp: 98 F (36.7 C) 97.8 F (36.6 C)  97.8 F (36.6 C)  TempSrc: Oral Oral  Oral  SpO2: 98% 96%    Weight:   51.9 kg   Height:        Intake/Output Summary (Last 24 hours) at 03/16/2024 1317 Last data filed at 03/15/2024 1900 Gross per 24 hour  Intake 240 ml  Output --  Net 240 ml     Wt Readings from Last 3 Encounters:  03/16/24 51.9 kg  11/15/23 58.5 kg  05/14/23 58.4 kg    Physical Exam General: Alert and oriented x 3, NAD Cardiovascular: S1 S2 clear, RRR.  Respiratory: CTAB, no wheezing Gastrointestinal: Soft, nontender, nondistended, NBS, dressing intact on PEG site Ext: no pedal edema bilaterally Neuro: no new deficits Psych: Somewhat confused   Data Reviewed:  I have personally reviewed following labs    CBC Lab Results  Component Value Date   WBC 6.9 03/15/2024   RBC 4.15 03/15/2024   HGB 12.0 03/15/2024   HCT 36.5 03/15/2024   MCV 88.0 03/15/2024   MCH 28.9 03/15/2024   PLT 228 03/15/2024   MCHC 32.9 03/15/2024   RDW 15.6 (H) 03/15/2024   LYMPHSABS 1.8 01/15/2024   MONOABS 0.9 01/15/2024   EOSABS 0.0 01/15/2024   BASOSABS 0.0 01/15/2024     Last metabolic panel Lab Results  Component Value Date   NA 144 03/15/2024   K 3.7 03/15/2024   CL 105 03/15/2024   CO2 26 03/15/2024   BUN 16 03/15/2024   CREATININE 0.87 03/15/2024   GLUCOSE 89 03/15/2024   GFRNONAA >60 03/15/2024   GFRAA >90 01/21/2014   CALCIUM 9.8 03/15/2024   PHOS 4.1 01/21/2024   PROT 6.7 03/15/2024   ALBUMIN 3.0 (L) 03/15/2024   LABGLOB 3.6  11/15/2023   BILITOT 0.6 03/15/2024   ALKPHOS 51 03/15/2024   AST 18 03/15/2024   ALT 27 03/15/2024   ANIONGAP 13 03/15/2024    CBG (last 3)  No results for input(s): GLUCAP in the last 72 hours.    Coagulation Profile: No results for input(s): INR, PROTIME in the last 168 hours.   Radiology Studies: I have personally reviewed the imaging studies  IR GASTROSTOMY TUBE REMOVAL/REPAIR Result Date:  03/14/2024 INDICATION: Patient with history of CVA, dysphagia s/p 20 Fr pull through gastrostomy placement now tolerating appropriate oral intake. Request for removal of gastrostomy tube. EXAM: BEDSIDE REMOVAL OF GASTROSTOMY TUBE COMPARISON:  None Available. MEDICATIONS: None. CONTRAST:  None FLUOROSCOPY TIME:  None COMPLICATIONS: None immediate. PROCEDURE: A time-out was performed prior to the initiation of the procedure. 8 cc of fluid was removed from the retention balloon and the gastrostomy tube was removed in tact without complication. A dressing was placed. The patient tolerated the procedure well without immediate postprocedural complication. IMPRESSION: Successful bedside removal of balloon retention gastrostomy tube without complication Performed by Clotilda Hesselbach, PA-C Electronically Signed   By: CHRISTELLA.  Shick M.D.   On: 03/14/2024 14:44       Prim Morace M.D. [facility] 03/16/2024, 1:17 PM  Available via Epic secure chat 7am-7pm After 7 pm, please refer to night coverage provider listed on amion.

## 2024-03-16 NOTE — Plan of Care (Signed)
  Problem: Education: Goal: Ability to describe self-care measures that may prevent or decrease complications (Diabetes Survival Skills Education) will improve Outcome: Progressing   Problem: Coping: Goal: Ability to adjust to condition or change in health will improve Outcome: Progressing   Problem: Fluid Volume: Goal: Ability to maintain a balanced intake and output will improve Outcome: Progressing   Problem: Health Behavior/Discharge Planning: Goal: Ability to identify and utilize available resources and services will improve Outcome: Progressing Goal: Ability to manage health-related needs will improve Outcome: Progressing   Problem: Metabolic: Goal: Ability to maintain appropriate glucose levels will improve Outcome: Progressing   Problem: Nutritional: Goal: Maintenance of adequate nutrition will improve Outcome: Progressing Goal: Progress toward achieving an optimal weight will improve Outcome: Progressing   Problem: Skin Integrity: Goal: Risk for impaired skin integrity will decrease Outcome: Progressing   Problem: Tissue Perfusion: Goal: Adequacy of tissue perfusion will improve Outcome: Progressing   Problem: Education: Goal: Knowledge of General Education information will improve Description: Including pain rating scale, medication(s)/side effects and non-pharmacologic comfort measures Outcome: Progressing   Problem: Health Behavior/Discharge Planning: Goal: Ability to manage health-related needs will improve Outcome: Progressing   Problem: Clinical Measurements: Goal: Ability to maintain clinical measurements within normal limits will improve Outcome: Progressing Goal: Will remain free from infection Outcome: Progressing Goal: Diagnostic test results will improve Outcome: Progressing Goal: Respiratory complications will improve Outcome: Progressing Goal: Cardiovascular complication will be avoided Outcome: Progressing   Problem: Activity: Goal:  Risk for activity intolerance will decrease Outcome: Progressing   Problem: Nutrition: Goal: Adequate nutrition will be maintained Outcome: Progressing   Problem: Coping: Goal: Level of anxiety will decrease Outcome: Progressing   Problem: Elimination: Goal: Will not experience complications related to bowel motility Outcome: Progressing Goal: Will not experience complications related to urinary retention Outcome: Progressing   Problem: Pain Managment: Goal: General experience of comfort will improve and/or be controlled Outcome: Progressing   Problem: Safety: Goal: Ability to remain free from injury will improve Outcome: Progressing   Problem: Skin Integrity: Goal: Risk for impaired skin integrity will decrease Outcome: Progressing   Problem: Education: Goal: Knowledge of disease or condition will improve Outcome: Progressing Goal: Knowledge of secondary prevention will improve (MUST DOCUMENT ALL) Outcome: Progressing Goal: Knowledge of patient specific risk factors will improve (DELETE if not current risk factor) Outcome: Progressing   Problem: Intracerebral Hemorrhage Tissue Perfusion: Goal: Complications of Intracerebral Hemorrhage will be minimized Outcome: Progressing   Problem: Coping: Goal: Will verbalize positive feelings about self Outcome: Progressing Goal: Will identify appropriate support needs Outcome: Progressing   Problem: Health Behavior/Discharge Planning: Goal: Ability to manage health-related needs will improve Outcome: Progressing Goal: Goals will be collaboratively established with patient/family Outcome: Progressing   Problem: Self-Care: Goal: Ability to participate in self-care as condition permits will improve Outcome: Progressing Goal: Verbalization of feelings and concerns over difficulty with self-care will improve Outcome: Progressing Goal: Ability to communicate needs accurately will improve Outcome: Progressing   Problem:  Nutrition: Goal: Risk of aspiration will decrease Outcome: Progressing Goal: Dietary intake will improve Outcome: Progressing

## 2024-03-16 NOTE — Progress Notes (Signed)
 Dr. Franky was made aware that the ptt was found down on the floor on her behind at 1911 hrs tonight. BP=147/101. HR=83. POX=99 on RA. Pt denied pain. MAEW. No change in neuro or mental status.

## 2024-03-16 NOTE — Progress Notes (Signed)
 Physical Therapy Treatment Patient Details Name: Amanda [name] MRN: [id number] DOB: 1960-09-30 Today's Date: 03/16/2024   History of Present Illness 63 yo female presents to [facility] on 9/22 with lethargy and near-syncope s/p cocaine and opioid ingestion. 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 S/p R frontal ventriculostomy, burr holes 9/22. ETT 9/22-9/23. PMHx of cocaine abuse, CHF, CKD, chronic cough, HTN and DM2.    PT Comments  Pt tolerated treatment well today. Pt today was able to ambulate around unit with RW CGA. Pt remains very impulsive. Upon returning to the room, pt rolled up a newspaper and hit this therapist twice stating you quit messing with me! for no apparent reason. No change in DC/DME recs at this time. PT will continue to follow.     If plan is discharge home, recommend the following: Supervision due to cognitive status;Assist for transportation;Help with stairs or ramp for entrance;Assistance with cooking/housework;Direct supervision/assist for medications management;Direct supervision/assist for financial management;A lot of help with walking and/or transfers;A lot of help with bathing/dressing/bathroom   Can travel by private vehicle     No  Equipment Recommendations  Rolling walker (2 wheels);BSC/3in1    Recommendations for Other Services       Precautions / Restrictions Precautions Precautions: Fall Recall of Precautions/Restrictions: Impaired Precaution/Restrictions Comments: PEG, SBP < 160, impulsive Restrictions Weight Bearing Restrictions Per Provider Order: No     Mobility  Bed Mobility Overal bed mobility: Needs Assistance Bed Mobility: Supine to Sit     Supine to sit: Contact guard, HOB elevated     General bed mobility comments: CGA for safety.    Transfers Overall transfer level: Needs assistance Equipment used: Rolling walker (2  wheels) Transfers: Sit to/from Stand Sit to Stand: Contact guard assist           General transfer comment: Cues for hand placement however pt still pulled up on RW.    Ambulation/Gait Ambulation/Gait assistance: Contact guard assist, Min assist Gait Distance (Feet): 150 Feet Assistive device: Rolling walker (2 wheels) Gait Pattern/deviations: Step-through pattern, Decreased stride length, Trunk flexed, Staggering right Gait velocity: Dec     General Gait Details: Pt opted for RW today. Cues for proximity to RW however not adhering to commands from this PT.   Stairs             Wheelchair Mobility     Tilt Bed    Modified Rankin (Stroke Patients Only)       Balance Overall balance assessment: Needs assistance Sitting-balance support: No upper extremity supported, Feet supported Sitting balance-Leahy Scale: Fair Sitting balance - Comments: seated EOB   Standing balance support: Reliant on assistive device for balance, During functional activity, Single extremity supported Standing balance-Leahy Scale: Poor Standing balance comment: reliant on RW                            Communication Communication Communication: No apparent difficulties Factors Affecting Communication: Reduced clarity of speech  Cognition Arousal: Alert Behavior During Therapy: Impulsive, Agitated   PT - Cognitive impairments: Safety/Judgement, Problem solving, Attention, Awareness                       PT - Cognition Comments: Pt easily distracted requiring frequent redirection and cues for attention. Was intermittently agitated when cues given to continue walking and stand tall. Following commands: Impaired Following commands impaired: Follows one step commands  with increased time    Cueing Cueing Techniques: Verbal cues, Gestural cues  Exercises      General Comments General comments (skin integrity, edema, etc.): VSS      Pertinent Vitals/Pain Pain  Assessment Pain Assessment: No/denies pain    Home Living                          Prior Function            PT Goals (current goals can now be found in the care plan section) Progress towards PT goals: Progressing toward goals    Frequency    Min 2X/week      PT Plan      Co-evaluation              AM-PAC PT 6 Clicks Mobility   Outcome Measure  Help needed turning from your back to your side while in a flat bed without using bedrails?: A Little Help needed moving from lying on your back to sitting on the side of a flat bed without using bedrails?: A Little Help needed moving to and from a bed to a chair (including a wheelchair)?: A Little Help needed standing up from a chair using your arms (e.g., wheelchair or bedside chair)?: A Little Help needed to walk in hospital room?: A Little Help needed climbing 3-5 steps with a railing? : A Little 6 Click Score: 18    End of Session Equipment Utilized During Treatment: Gait belt Activity Tolerance: Patient tolerated treatment well Patient left: Other (comment) (Left at sink brushing teeth with sitter) Nurse Communication: Mobility status;Other (comment) (Intermittent agitation) PT Visit Diagnosis: Unsteadiness on feet (R26.81);Difficulty in walking, not elsewhere classified (R26.2);Other abnormalities of gait and mobility (R26.89);Other symptoms and signs involving the nervous system (R29.898);Muscle weakness (generalized) (M62.81)     Time: 8588-8574 PT Time Calculation (min) (ACUTE ONLY): 14 min  Charges:    $Gait Training: 8-22 mins PT General Charges $$ ACUTE PT VISIT: 1 Visit                     Amanda [name], PT, DPT Acute Rehab Services [phone]    Amanda [name] 03/16/2024, 4:32 PM

## 2024-03-16 NOTE — Plan of Care (Signed)
   Problem: Education: Goal: Ability to describe self-care measures that may prevent or decrease complications (Diabetes Survival Skills Education) will improve Outcome: Progressing   Problem: Coping: Goal: Ability to adjust to condition or change in health will improve Outcome: Progressing   Problem: Fluid Volume: Goal: Ability to maintain a balanced intake and output will improve Outcome: Progressing

## 2024-03-16 NOTE — Plan of Care (Signed)
  Problem: Education: Goal: Ability to describe self-care measures that may prevent or decrease complications (Diabetes Survival Skills Education) will improve 03/16/2024 0749 by Gwynneth Richerd LABOR, RN Outcome: Progressing 03/16/2024 0749 by Gwynneth Richerd LABOR, RN Outcome: Progressing   Problem: Coping: Goal: Ability to adjust to condition or change in health will improve 03/16/2024 0749 by Gwynneth Richerd LABOR, RN Outcome: Progressing 03/16/2024 0749 by Gwynneth Richerd LABOR, RN Outcome: Progressing   Problem: Fluid Volume: Goal: Ability to maintain a balanced intake and output will improve 03/16/2024 0749 by Gwynneth Richerd LABOR, RN Outcome: Progressing 03/16/2024 0749 by Gwynneth Richerd LABOR, RN Outcome: Progressing   Problem: Health Behavior/Discharge Planning: Goal: Ability to identify and utilize available resources and services will improve Outcome: Progressing

## 2024-03-17 ENCOUNTER — Inpatient Hospital Stay (HOSPITAL_COMMUNITY)

## 2024-03-17 DIAGNOSIS — I161 Hypertensive emergency: Secondary | ICD-10-CM | POA: Diagnosis not present

## 2024-03-17 DIAGNOSIS — I5042 Chronic combined systolic (congestive) and diastolic (congestive) heart failure: Secondary | ICD-10-CM | POA: Diagnosis not present

## 2024-03-17 DIAGNOSIS — F1410 Cocaine abuse, uncomplicated: Secondary | ICD-10-CM | POA: Diagnosis not present

## 2024-03-17 DIAGNOSIS — I619 Nontraumatic intracerebral hemorrhage, unspecified: Secondary | ICD-10-CM | POA: Diagnosis not present

## 2024-03-17 NOTE — Progress Notes (Signed)
 Pt s/p fall yesterday evening. Pt has been adamantly refusing STAT CT of head that has been ordered overnight and during this shift. Hospitalist Dr. Davia made aware of refusal. Will continue to monitor

## 2024-03-17 NOTE — Plan of Care (Signed)
  Problem: Fluid Volume: Goal: Ability to maintain a balanced intake and output will improve Outcome: Progressing   Problem: Nutritional: Goal: Maintenance of adequate nutrition will improve Outcome: Progressing   Problem: Skin Integrity: Goal: Risk for impaired skin integrity will decrease Outcome: Progressing   Problem: Tissue Perfusion: Goal: Adequacy of tissue perfusion will improve Outcome: Progressing   Problem: Clinical Measurements: Goal: Ability to maintain clinical measurements within normal limits will improve Outcome: Progressing Goal: Will remain free from infection Outcome: Progressing   Problem: Pain Managment: Goal: General experience of comfort will improve and/or be controlled Outcome: Progressing   Problem: Safety: Goal: Ability to remain free from injury will improve Outcome: Progressing   Problem: Intracerebral Hemorrhage Tissue Perfusion: Goal: Complications of Intracerebral Hemorrhage will be minimized Outcome: Progressing

## 2024-03-17 NOTE — Progress Notes (Signed)
 Occupational Therapy Treatment Patient Details Name: Amanda [name] MRN: [id number] DOB: 06/13/1960 Today's Date: 03/17/2024   History of present illness 63 yo female presents to [facility] on 9/22 with lethargy and near-syncope s/p cocaine and opioid ingestion. 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 S/p R frontal ventriculostomy, burr holes 9/22. ETT 9/22-9/23. PMHx of cocaine abuse, CHF, CKD, chronic cough, HTN and DM2.   OT comments  Patient demonstrating good gains with OT treatment.  Patient seated on EOB and agreeable to OT session. Patient able to donn pants with min assist and CGA to perform grooming tasks standing at sink. Patient's breakfast arrived during session and patient wanted to end session for breakfast. Patient able to open all containers for breakfast. Patient will benefit from continued inpatient follow up therapy, <3 hours/day.  Acute OT to continue to follow to address established goals to facilitate DC to next venue of care.        If plan is discharge home, recommend the following:  Assistance with cooking/housework;Help with stairs or ramp for entrance;Direct supervision/assist for financial management;Direct supervision/assist for medications management;Assist for transportation;Supervision due to cognitive status;A little help with walking and/or transfers;A little help with bathing/dressing/bathroom   Equipment Recommendations  Other (comment) (defer)    Recommendations for Other Services      Precautions / Restrictions Precautions Precautions: Fall Recall of Precautions/Restrictions: Impaired Precaution/Restrictions Comments: PEG, SBP < 160, impulsive Restrictions Weight Bearing Restrictions Per Provider Order: No       Mobility Bed Mobility Overal bed mobility: Needs Assistance             General bed mobility comments: seated on EOB upon entry and in  recliner at end of sessoin    Transfers Overall transfer level: Needs assistance Equipment used: Rolling walker (2 wheels) Transfers: Sit to/from Stand Sit to Stand: Contact guard assist           General transfer comment: cues for hand placement and safety     Balance Overall balance assessment: Needs assistance Sitting-balance support: No upper extremity supported, Feet supported Sitting balance-Leahy Scale: Fair Sitting balance - Comments: seated EOB   Standing balance support: Reliant on assistive device for balance, During functional activity, Single extremity supported Standing balance-Leahy Scale: Poor Standing balance comment: reliant on sink or RW for support                           ADL either performed or assessed with clinical judgement   ADL Overall ADL's : Needs assistance/impaired Eating/Feeding: Set up;Sitting Eating/Feeding Details (indicate cue type and reason): patient able to open all containers Grooming: Wash/dry hands;Wash/dry face;Oral care;Contact guard assist;Standing               Lower Body Dressing: Minimal assistance;Sitting/lateral leans Lower Body Dressing Details (indicate cue type and reason): donned paper scrub pants                    Extremity/Trunk Assessment              Vision       Perception     Praxis     Communication Communication Communication: No apparent difficulties   Cognition Arousal: Alert Behavior During Therapy: Impulsive Cognition: Cognition impaired   Orientation impairments: Time, Situation Awareness: Intellectual awareness impaired Memory impairment (select all impairments): Short-term memory, Working memory Attention impairment (select first level of impairment): Sustained attention Executive  functioning impairment (select all impairments): Initiation, Sequencing, Reasoning, Problem solving                   Following commands: Impaired Following commands  impaired: Follows one step commands with increased time      Cueing   Cueing Techniques: Verbal cues, Gestural cues  Exercises      Shoulder Instructions       General Comments Patient had a fall last night but patient does not recall    Pertinent Vitals/ Pain       Pain Assessment Pain Assessment: No/denies pain Pain Intervention(s): Monitored during session  Home Living                                          Prior Functioning/Environment              Frequency  Min 2X/week        Progress Toward Goals  OT Goals(current goals can now be found in the care plan section)  Progress towards OT goals: Progressing toward goals  Acute Rehab OT Goals Patient Stated Goal: to eat breakfast OT Goal Formulation: With patient Time For Goal Achievement: 03/21/24 Potential to Achieve Goals: Fair ADL Goals Pt Will Perform Eating: with set-up;sitting Pt Will Perform Grooming: with supervision;standing Pt Will Transfer to Toilet: with supervision;ambulating;regular height toilet Additional ADL Goal #1: Pt will attend to items on L side and properly orient self to sink/task at hand 75% of the time, in preparation for ADLs performed in standing. Additional ADL Goal #2: Pt will follow 2-step commands 50% of the time during ADLs and functional mobility to demonstrate improved attention.  Plan      Co-evaluation                 AM-PAC OT 6 Clicks Daily Activity     Outcome Measure   Help from another person eating meals?: A Little Help from another person taking care of personal grooming?: A Little Help from another person toileting, which includes using toliet, bedpan, or urinal?: A Little Help from another person bathing (including washing, rinsing, drying)?: A Little Help from another person to put on and taking off regular upper body clothing?: A Little Help from another person to put on and taking off regular lower body clothing?: A Little 6  Click Score: 18    End of Session Equipment Utilized During Treatment: Gait belt;Rolling walker (2 wheels)  OT Visit Diagnosis: Unsteadiness on feet (R26.81);Other abnormalities of gait and mobility (R26.89);Muscle weakness (generalized) (M62.81)   Activity Tolerance Patient tolerated treatment well   Patient Left in chair;with call bell/phone within reach;with chair alarm set;with nursing/sitter in room   Nurse Communication Mobility status        Time: 0720-0737 OT Time Calculation (min): 17 min  Charges: OT General Charges $OT Visit: 1 Visit OT Treatments $Self Care/Home Management : 8-22 mins  Dick Laine, OTA Acute Rehabilitation Services  Office [phone]   Jeb LITTIE Laine 03/17/2024, 9:49 AM

## 2024-03-17 NOTE — Progress Notes (Signed)
 [facility]                                                                              Amanda [name], is a 63 y.o. female, DOB - 1960/10/23, FMW:[id number] Admit date - 01/03/2024    Outpatient Primary MD for the Amanda [name] is Amanda [name], No Pcp Per  LOS - 74  days  Chief Complaint  Amanda [name] presents with   Altered Mental Status       Brief summary   63 year old female with history of hypertension, cocaine abuse, diabetes type 2 who presented with lethargy, near syncope. Found to be severely hypertensive, UDS positive for cocaine, with acute intraparenchymal hemorrhage , intraventricular hemorrhage and hydrocephalus. Amanda [name] was admitted to ICU for close blood pressure management. Neurosurgery consulted. Status post right frontal ventriculostomy. EVD eventually removed on 10/3. Hospital course remarkable for dysphagia. Dietitian/speech therapy recommending PEG placement. PT/OT recommending SNF on discharge. S/o PEG placement by IR on 10/16. Tube feeds stopped 02/18/24, currently on supplements, plan to remove PEG 03/19/24 if oral intake better. On and off agitated and did require restraints.   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         - Pending placement   Hypertension:  - BP stable, continue Coreg , Entresto , Aldactone   -Amlodipine  discontinued due to history of systolic CHF     Dysphagia:  Moderate malnutrition- S/P PEG placement on 10/16. Currently on dysphagia 3 diet and eating better, meeting all the nutritional demands, dietitian on board, tube feeds stopped 02/18/2024.  Continue protein supplements. -PEG removed on 03/14/24 -Continue to encourage p.o. diet/dysphagia 3 diet   Chronic combined systolic/diastolic CHF:  - Compensated, continue Entresto ,  spironolactone , Coreg   - Not on aspirin  due to recent intraparenchymal hemorrhage     Type 2 diabetes:  Recent A1c of 6.6.   Cocaine abuse:  Consulted TOC for cocaine cessation resources.   S/p fall- -Continue fall, delirium precautions. -Continue sitter  - Per PT, pt cannot walk on her own and she is impulsive and unaware of her deficits. Not safe for d/c home - Continue Seroquel  - Overnight had a fall, pelvic x-ray negative, CT head pending although no FND's and mental status close to baseline  Moderate protein calorie malnutrition Nutrition Problem: Moderate Malnutrition Etiology: social / environmental circumstances (polysubstance abuse) Signs/Symptoms: mild muscle depletion, moderate fat depletion, mild fat depletion, moderate muscle depletion Interventions: Ensure Enlive (each supplement provides 350kcal and 20 grams of protein), MVI  Estimated body mass index is 19.04 kg/m as calculated from the following:   Height as of this encounter: 5' 5 (1.651 m).   Weight as of this encounter: 51.9 kg.  Code Status: Full code DVT Prophylaxis:  enoxaparin  (LOVENOX ) injection 40 mg Start: 02/21/24 1430 Place and maintain sequential compression device Start: 01/03/24 0611 SCDs Start: 01/03/24 0429   Level of Care: Level of care: Med-Surg Family Communication:  Disposition Plan:      Remains inpatient appropriate: No acute medical issues, awaiting placement  Procedures:    Consultants:   Neurology  Antimicrobials:   Anti-infectives (From admission, onward)    Start     Dose/Rate Route Frequency Ordered Stop   01/27/24 0807  ceFAZolin  (ANCEF ) IVPB 2g/100 mL premix        over 30 Minutes Intravenous Continuous PRN 01/27/24 0807 01/27/24 0807   01/13/24 0600  ceFAZolin  (ANCEF ) IVPB 2g/100 mL premix        2 g 200 mL/hr over 30 Minutes Intravenous On call to O.R. 01/11/24 2300 01/13/24 0557          Medications  carvedilol   25 mg Oral BID WC   enoxaparin   (LOVENOX ) injection  40 mg Subcutaneous Daily   feeding supplement  237 mL Oral TID BM   free water   50 mL Per Tube Daily   mineral oil-hydrophilic petrolatum    Topical BID   QUEtiapine   25 mg Oral QAC supper   sacubitril -valsartan   1 tablet Oral BID   spironolactone   25 mg Oral Daily      Subjective:   Amanda [name] was seen and examined today.  Still somewhat confused but no acute agitation, pleasant and cooperative.  Sitter at the bedside.  Overnight found on the floor.  No injuries, mental status close to baseline.  No chest pain or shortness of breath.   Objective:   Vitals:   03/17/24 0007 03/17/24 0536 03/17/24 0725 03/17/24 1135  BP: (!) 152/93 (!) 142/91 (!) 139/95 (!) 149/94  Pulse: 74 62 71 70  Resp:   19 18  Temp: 98.4 F (36.9 C) (!) 97.4 F (36.3 C) 97.7 F (36.5 C) 98.3 F (36.8 C)  TempSrc: Oral Oral Oral Oral  SpO2: 97% 97% 99% 98%  Weight:      Height:       No intake or output data in the 24 hours ending 03/17/24 1213    Wt Readings from Last 3 Encounters:  03/16/24 51.9 kg  11/15/23 58.5 kg  05/14/23 58.4 kg   Physical Exam General: Alert and oriented x self , NAD Cardiovascular: S1 S2 clear, RRR.  Respiratory: CTAB Gastrointestinal: Soft, NT, ND, dressing intact on the PEG site, NBS Ext: no pedal edema bilaterally Neuro: no new deficits Skin: No rashes Psych: Confused   Data Reviewed:  I have personally reviewed following labs    CBC Lab Results  Component Value Date   WBC 6.9 03/15/2024   RBC 4.15 03/15/2024   HGB 12.0 03/15/2024   HCT 36.5 03/15/2024   MCV 88.0 03/15/2024   MCH 28.9 03/15/2024   PLT 228 03/15/2024   MCHC 32.9 03/15/2024   RDW 15.6 (H) 03/15/2024   LYMPHSABS 1.8 01/15/2024   MONOABS 0.9 01/15/2024   EOSABS 0.0 01/15/2024   BASOSABS 0.0 01/15/2024     Last metabolic panel Lab Results  Component Value Date   NA 144 03/15/2024   K 3.7 03/15/2024   CL 105 03/15/2024   CO2 26 03/15/2024   BUN 16  03/15/2024   CREATININE 0.87 03/15/2024   GLUCOSE 89 03/15/2024   GFRNONAA >60 03/15/2024   GFRAA >90 01/21/2014   CALCIUM 9.8 03/15/2024   PHOS 4.1 01/21/2024   PROT 6.7 03/15/2024   ALBUMIN 3.0 (L) 03/15/2024   LABGLOB 3.6 11/15/2023   BILITOT 0.6 03/15/2024   ALKPHOS 51 03/15/2024   AST 18 03/15/2024   ALT 27 03/15/2024   ANIONGAP 13 03/15/2024    CBG (last 3)  No results for input(s): GLUCAP in the last  72 hours.    Coagulation Profile: No results for input(s): INR, PROTIME in the last 168 hours.   Radiology Studies: I have personally reviewed the imaging studies  DG Pelvis 1-2 Views Result Date: 03/16/2024 CLINICAL DATA:  Fall EXAM: PELVIS - 1-2 VIEW COMPARISON:  Abdominal x-ray 09/08/2021 FINDINGS: There is no evidence of pelvic fracture or diastasis. No pelvic bone lesions are seen. There are vascular calcifications in the pelvis. IMPRESSION: Negative. Electronically Signed   By: Greig Pique M.D.   On: 03/16/2024 20:48       Tevyn Codd M.D. [facility] 03/17/2024, 12:13 PM  Available via Epic secure chat 7am-7pm After 7 pm, please refer to night coverage provider listed on amion.

## 2024-03-18 DIAGNOSIS — I619 Nontraumatic intracerebral hemorrhage, unspecified: Secondary | ICD-10-CM | POA: Diagnosis not present

## 2024-03-18 DIAGNOSIS — F1410 Cocaine abuse, uncomplicated: Secondary | ICD-10-CM | POA: Diagnosis not present

## 2024-03-18 DIAGNOSIS — I161 Hypertensive emergency: Secondary | ICD-10-CM | POA: Diagnosis not present

## 2024-03-18 DIAGNOSIS — I5042 Chronic combined systolic (congestive) and diastolic (congestive) heart failure: Secondary | ICD-10-CM | POA: Diagnosis not present

## 2024-03-18 NOTE — Progress Notes (Signed)
 [facility]                                                                              Amanda [name], is a 63 y.o. female, DOB - [birth year], FMW:[id number] Admit date - 01/03/2024    Outpatient Primary MD for the patient is Patient, No Pcp Per  LOS - 75  days  Chief Complaint  Patient presents with   Altered Mental Status       Brief summary   63 year old female with history of hypertension, cocaine abuse, diabetes type 2 who presented with lethargy, near syncope. Found to be severely hypertensive, UDS positive for cocaine, with acute intraparenchymal hemorrhage , intraventricular hemorrhage and hydrocephalus. Patient was admitted to ICU for close blood pressure management. Neurosurgery consulted. Status post right frontal ventriculostomy. EVD eventually removed on 10/3. Hospital course remarkable for dysphagia. Dietitian/speech therapy recommending PEG placement. PT/OT recommending SNF on discharge. S/o PEG placement by IR on 10/16. Tube feeds stopped 02/18/24, currently on supplements, plan to remove PEG 03/19/24 if oral intake better. On and off agitated and did require restraints.   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         - Pending placement   Hypertension:  - BP stable, continue Coreg , Entresto , Aldactone   -Amlodipine  discontinued due to history of systolic CHF     Dysphagia:  Moderate malnutrition- S/P PEG placement on 10/16. Currently on dysphagia 3 diet and eating better, meeting all the nutritional demands, dietitian on board, tube feeds stopped 02/18/2024.  Continue protein supplements. -PEG removed on 03/14/24 -Continue to encourage p.o. diet/dysphagia 3 diet   Chronic combined systolic/diastolic CHF:  - Compensated, continue Entresto ,  spironolactone , Coreg   - Not on aspirin  due to recent intraparenchymal hemorrhage     Type 2 diabetes:  Recent A1c of 6.6.   Cocaine abuse:  Consulted TOC for cocaine cessation resources.   S/p fall- -Continue fall, delirium precautions. -Continue sitter  - Per PT, pt cannot walk on her own and she is impulsive and unaware of her deficits. Not safe for d/c home - Continue Seroquel  - Refused CT head.  Moderate protein calorie malnutrition Nutrition Problem: Moderate Malnutrition Etiology: social / environmental circumstances (polysubstance abuse) Signs/Symptoms: mild muscle depletion, moderate fat depletion, mild fat depletion, moderate muscle depletion Interventions: Ensure Enlive (each supplement provides 350kcal and 20 grams of protein), MVI  Estimated body mass index is 19.04 kg/m as calculated from the following:   Height as of this encounter: 5' 5 (1.651 m).   Weight as of this encounter: 51.9 kg.  Code Status: Full code DVT Prophylaxis:  enoxaparin  (LOVENOX ) injection 40 mg Start: 02/21/24 1430 Place and maintain sequential compression device Start: 01/03/24 0611 SCDs Start: 01/03/24 0429   Level of Care: Level of care: Med-Surg Family Communication:  Disposition Plan:      Remains inpatient appropriate: No acute medical issues, awaiting placement   Procedures:    Consultants:   Neurology  Antimicrobials:   Anti-infectives (From  admission, onward)    Start     Dose/Rate Route Frequency Ordered Stop   01/27/24 0807  ceFAZolin  (ANCEF ) IVPB 2g/100 mL premix        over 30 Minutes Intravenous Continuous PRN 01/27/24 0807 01/27/24 0807   01/13/24 0600  ceFAZolin  (ANCEF ) IVPB 2g/100 mL premix        2 g 200 mL/hr over 30 Minutes Intravenous On call to O.R. 01/11/24 2300 01/13/24 0557          Medications  carvedilol   25 mg Oral BID WC   enoxaparin  (LOVENOX ) injection  40 mg Subcutaneous Daily   feeding supplement  237 mL Oral TID BM   free water   50  mL Per Tube Daily   mineral oil-hydrophilic petrolatum    Topical BID   QUEtiapine   25 mg Oral QAC supper   sacubitril -valsartan   1 tablet Oral BID   spironolactone   25 mg Oral Daily      Subjective:   Amanda [name] was seen and examined today.  Sitting up in the chair, watching TV, no acute complaints.  Sitter at the bedside.  No headache, chest pain, shortness of breath, fevers  Objective:   Vitals:   03/17/24 1651 03/17/24 1912 03/18/24 0556 03/18/24 0927  BP: (!) 167/98 129/72 (!) 148/93 113/80  Pulse: 74 81 67 80  Resp:  18 18 18   Temp:  97.7 F (36.5 C) 97.9 F (36.6 C) 97.8 F (36.6 C)  TempSrc:  Oral Oral Oral  SpO2:  100%  100%  Weight:      Height:       No intake or output data in the 24 hours ending 03/18/24 1134    Wt Readings from Last 3 Encounters:  03/16/24 51.9 kg  11/15/23 58.5 kg  05/14/23 58.4 kg   Physical Exam General: Alert and oriented to self, NAD Cardiovascular: S1 S2 clear, RRR.  Respiratory: CTAB, no wheezing, rales or rhonchi Gastrointestinal: Soft, nontender, dressing intact at the PEG site, NBS  Ext: no pedal edema bilaterally Neuro: no new deficits Psych: No acute change  Data Reviewed:  I have personally reviewed following labs    CBC Lab Results  Component Value Date   WBC 6.9 03/15/2024   RBC 4.15 03/15/2024   HGB 12.0 03/15/2024   HCT 36.5 03/15/2024   MCV 88.0 03/15/2024   MCH 28.9 03/15/2024   PLT 228 03/15/2024   MCHC 32.9 03/15/2024   RDW 15.6 (H) 03/15/2024   LYMPHSABS 1.8 01/15/2024   MONOABS 0.9 01/15/2024   EOSABS 0.0 01/15/2024   BASOSABS 0.0 01/15/2024     Last metabolic panel Lab Results  Component Value Date   NA 144 03/15/2024   K 3.7 03/15/2024   CL 105 03/15/2024   CO2 26 03/15/2024   BUN 16 03/15/2024   CREATININE 0.87 03/15/2024   GLUCOSE 89 03/15/2024   GFRNONAA >60 03/15/2024   GFRAA >90 01/21/2014   CALCIUM 9.8 03/15/2024   PHOS 4.1 01/21/2024   PROT 6.7 03/15/2024   ALBUMIN 3.0  (L) 03/15/2024   LABGLOB 3.6 11/15/2023   BILITOT 0.6 03/15/2024   ALKPHOS 51 03/15/2024   AST 18 03/15/2024   ALT 27 03/15/2024   ANIONGAP 13 03/15/2024    CBG (last 3)  No results for input(s): GLUCAP in the last 72 hours.    Coagulation Profile: No results for input(s): INR, PROTIME in the last 168 hours.   Radiology Studies: I have personally reviewed the imaging studies  DG Pelvis  1-2 Views Result Date: 03/16/2024 CLINICAL DATA:  Fall EXAM: PELVIS - 1-2 VIEW COMPARISON:  Abdominal x-ray 09/08/2021 FINDINGS: There is no evidence of pelvic fracture or diastasis. No pelvic bone lesions are seen. There are vascular calcifications in the pelvis. IMPRESSION: Negative. Electronically Signed   By: Greig Pique M.D.   On: 03/16/2024 20:48       Dominik Yordy M.D. [facility] 03/18/2024, 11:34 AM  Available via Epic secure chat 7am-7pm After 7 pm, please refer to night coverage provider listed on amion.

## 2024-03-18 NOTE — Plan of Care (Signed)
  Problem: Education: Goal: Ability to describe self-care measures that may prevent or decrease complications (Diabetes Survival Skills Education) will improve Outcome: Progressing   Problem: Coping: Goal: Ability to adjust to condition or change in health will improve Outcome: Progressing   Problem: Fluid Volume: Goal: Ability to maintain a balanced intake and output will improve Outcome: Progressing   Problem: Health Behavior/Discharge Planning: Goal: Ability to identify and utilize available resources and services will improve Outcome: Progressing Goal: Ability to manage health-related needs will improve Outcome: Progressing   Problem: Metabolic: Goal: Ability to maintain appropriate glucose levels will improve Outcome: Progressing   Problem: Nutritional: Goal: Maintenance of adequate nutrition will improve Outcome: Progressing Goal: Progress toward achieving an optimal weight will improve Outcome: Progressing   Problem: Skin Integrity: Goal: Risk for impaired skin integrity will decrease Outcome: Progressing   Problem: Tissue Perfusion: Goal: Adequacy of tissue perfusion will improve Outcome: Progressing   Problem: Education: Goal: Knowledge of General Education information will improve Description: Including pain rating scale, medication(s)/side effects and non-pharmacologic comfort measures Outcome: Progressing   Problem: Health Behavior/Discharge Planning: Goal: Ability to manage health-related needs will improve Outcome: Progressing   Problem: Clinical Measurements: Goal: Ability to maintain clinical measurements within normal limits will improve Outcome: Progressing Goal: Will remain free from infection Outcome: Progressing Goal: Diagnostic test results will improve Outcome: Progressing Goal: Respiratory complications will improve Outcome: Progressing Goal: Cardiovascular complication will be avoided Outcome: Progressing   Problem: Activity: Goal:  Risk for activity intolerance will decrease Outcome: Progressing   Problem: Nutrition: Goal: Adequate nutrition will be maintained Outcome: Progressing   Problem: Coping: Goal: Level of anxiety will decrease Outcome: Progressing   Problem: Elimination: Goal: Will not experience complications related to bowel motility Outcome: Progressing Goal: Will not experience complications related to urinary retention Outcome: Progressing   Problem: Pain Managment: Goal: General experience of comfort will improve and/or be controlled Outcome: Progressing   Problem: Safety: Goal: Ability to remain free from injury will improve Outcome: Progressing   Problem: Skin Integrity: Goal: Risk for impaired skin integrity will decrease Outcome: Progressing   Problem: Education: Goal: Knowledge of disease or condition will improve Outcome: Progressing Goal: Knowledge of secondary prevention will improve (MUST DOCUMENT ALL) Outcome: Progressing Goal: Knowledge of patient specific risk factors will improve (DELETE if not current risk factor) Outcome: Progressing   Problem: Intracerebral Hemorrhage Tissue Perfusion: Goal: Complications of Intracerebral Hemorrhage will be minimized Outcome: Progressing   Problem: Coping: Goal: Will verbalize positive feelings about self Outcome: Progressing Goal: Will identify appropriate support needs Outcome: Progressing   Problem: Health Behavior/Discharge Planning: Goal: Ability to manage health-related needs will improve Outcome: Progressing Goal: Goals will be collaboratively established with patient/family Outcome: Progressing   Problem: Self-Care: Goal: Ability to participate in self-care as condition permits will improve Outcome: Progressing Goal: Verbalization of feelings and concerns over difficulty with self-care will improve Outcome: Progressing Goal: Ability to communicate needs accurately will improve Outcome: Progressing   Problem:  Nutrition: Goal: Risk of aspiration will decrease Outcome: Progressing Goal: Dietary intake will improve Outcome: Progressing

## 2024-03-18 NOTE — Plan of Care (Signed)
  Problem: Nutritional: Goal: Maintenance of adequate nutrition will improve Outcome: Progressing Goal: Progress toward achieving an optimal weight will improve Outcome: Progressing   Problem: Skin Integrity: Goal: Risk for impaired skin integrity will decrease Outcome: Progressing   Problem: Clinical Measurements: Goal: Ability to maintain clinical measurements within normal limits will improve Outcome: Progressing Goal: Will remain free from infection Outcome: Progressing Goal: Diagnostic test results will improve Outcome: Progressing Goal: Respiratory complications will improve Outcome: Progressing Goal: Cardiovascular complication will be avoided Outcome: Progressing   Problem: Activity: Goal: Risk for activity intolerance will decrease Outcome: Progressing   Problem: Nutrition: Goal: Adequate nutrition will be maintained Outcome: Progressing   Problem: Elimination: Goal: Will not experience complications related to bowel motility Outcome: Progressing Goal: Will not experience complications related to urinary retention Outcome: Progressing   Problem: Pain Managment: Goal: General experience of comfort will improve and/or be controlled Outcome: Progressing   Problem: Safety: Goal: Ability to remain free from injury will improve Outcome: Progressing

## 2024-03-19 LAB — GLUCOSE, CAPILLARY: Glucose-Capillary: 132 mg/dL — ABNORMAL HIGH (ref 70–99)

## 2024-03-19 NOTE — Plan of Care (Signed)
  Problem: Education: Goal: Ability to describe self-care measures that may prevent or decrease complications (Diabetes Survival Skills Education) will improve Outcome: Progressing   Problem: Coping: Goal: Ability to adjust to condition or change in health will improve Outcome: Progressing   Problem: Fluid Volume: Goal: Ability to maintain a balanced intake and output will improve Outcome: Progressing   Problem: Health Behavior/Discharge Planning: Goal: Ability to identify and utilize available resources and services will improve Outcome: Progressing Goal: Ability to manage health-related needs will improve Outcome: Progressing   Problem: Metabolic: Goal: Ability to maintain appropriate glucose levels will improve Outcome: Progressing   Problem: Nutritional: Goal: Maintenance of adequate nutrition will improve Outcome: Progressing Goal: Progress toward achieving an optimal weight will improve Outcome: Progressing   Problem: Skin Integrity: Goal: Risk for impaired skin integrity will decrease Outcome: Progressing   Problem: Tissue Perfusion: Goal: Adequacy of tissue perfusion will improve Outcome: Progressing   Problem: Education: Goal: Knowledge of General Education information will improve Description: Including pain rating scale, medication(s)/side effects and non-pharmacologic comfort measures Outcome: Progressing   Problem: Health Behavior/Discharge Planning: Goal: Ability to manage health-related needs will improve Outcome: Progressing   Problem: Clinical Measurements: Goal: Ability to maintain clinical measurements within normal limits will improve Outcome: Progressing Goal: Will remain free from infection Outcome: Progressing Goal: Diagnostic test results will improve Outcome: Progressing Goal: Respiratory complications will improve Outcome: Progressing Goal: Cardiovascular complication will be avoided Outcome: Progressing   Problem: Activity: Goal:  Risk for activity intolerance will decrease Outcome: Progressing   Problem: Nutrition: Goal: Adequate nutrition will be maintained Outcome: Progressing   Problem: Coping: Goal: Level of anxiety will decrease Outcome: Progressing   Problem: Elimination: Goal: Will not experience complications related to bowel motility Outcome: Progressing Goal: Will not experience complications related to urinary retention Outcome: Progressing   Problem: Pain Managment: Goal: General experience of comfort will improve and/or be controlled Outcome: Progressing   Problem: Safety: Goal: Ability to remain free from injury will improve Outcome: Progressing   Problem: Skin Integrity: Goal: Risk for impaired skin integrity will decrease Outcome: Progressing   Problem: Education: Goal: Knowledge of disease or condition will improve Outcome: Progressing Goal: Knowledge of secondary prevention will improve (MUST DOCUMENT ALL) Outcome: Progressing Goal: Knowledge of patient specific risk factors will improve (DELETE if not current risk factor) Outcome: Progressing   Problem: Intracerebral Hemorrhage Tissue Perfusion: Goal: Complications of Intracerebral Hemorrhage will be minimized Outcome: Progressing   Problem: Coping: Goal: Will verbalize positive feelings about self Outcome: Progressing Goal: Will identify appropriate support needs Outcome: Progressing   Problem: Health Behavior/Discharge Planning: Goal: Ability to manage health-related needs will improve Outcome: Progressing Goal: Goals will be collaboratively established with patient/family Outcome: Progressing   Problem: Self-Care: Goal: Ability to participate in self-care as condition permits will improve Outcome: Progressing Goal: Verbalization of feelings and concerns over difficulty with self-care will improve Outcome: Progressing Goal: Ability to communicate needs accurately will improve Outcome: Progressing   Problem:  Nutrition: Goal: Risk of aspiration will decrease Outcome: Progressing Goal: Dietary intake will improve Outcome: Progressing

## 2024-03-19 NOTE — Progress Notes (Signed)
 [facility]                                                                              Amanda [name], is a 63 y.o. female, DOB - [birth year], FMW:[id number] Admit date - 01/03/2024    Outpatient Primary MD for the patient is Patient, No Pcp Per  LOS - 76  days  Chief Complaint  Patient presents with   Altered Mental Status       Brief summary   63 year old female with history of hypertension, cocaine abuse, diabetes type 2 who presented with lethargy, near syncope. Found to be severely hypertensive, UDS positive for cocaine, with acute intraparenchymal hemorrhage , intraventricular hemorrhage and hydrocephalus. Patient was admitted to ICU for close blood pressure management. Neurosurgery consulted. Status post right frontal ventriculostomy. EVD eventually removed on 10/3. Hospital course remarkable for dysphagia. Dietitian/speech therapy recommending PEG placement. PT/OT recommending SNF on discharge. S/o PEG placement by IR on 10/16. Tube feeds stopped 02/18/24, currently on supplements, plan to remove PEG 03/19/24 if oral intake better. On and off agitated and did require restraints.   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         - No acute issues, awaiting placement   Hypertension:  - BP stable, continue Coreg , Entresto , Aldactone   -Amlodipine  discontinued due to history of systolic CHF     Dysphagia:  Moderate malnutrition- S/P PEG placement on 10/16. Currently on dysphagia 3 diet and eating better, meeting [name] the nutritional demands, dietitian on board, tube feeds stopped 02/18/2024.  Continue protein supplements. -PEG removed on 03/14/24 -Continue to encourage p.o. diet/dysphagia 3 diet   Chronic combined systolic/diastolic CHF:  - Compensated,  continue Entresto , spironolactone , Coreg   - Not on aspirin  due to recent intraparenchymal hemorrhage     Type 2 diabetes:  Recent A1c of 6.6.   Cocaine abuse:  Consulted TOC for cocaine cessation resources.   S/p fall- -Continue fall, delirium precautions. -Continue sitter  - Per PT, pt cannot walk on her own and she is impulsive and unaware of her deficits. Not safe for d/c home - Continue Seroquel  - Refused CT head.  Moderate protein calorie malnutrition Nutrition Problem: Moderate Malnutrition Etiology: social / environmental circumstances (polysubstance abuse) Signs/Symptoms: mild muscle depletion, moderate fat depletion, mild fat depletion, moderate muscle depletion Interventions: Ensure Enlive (each supplement provides 350kcal and 20 grams of protein), MVI  Estimated body mass index is 19.37 kg/m as calculated from the following:   Height as of this encounter: 5' 5 (1.651 m).   Weight as of this encounter: 52.8 kg.  Code Status: Full code DVT Prophylaxis:  enoxaparin  (LOVENOX ) injection 40 mg Start: 02/21/24 1430 Place and maintain sequential compression device Start: 01/03/24 0611 SCDs Start: 01/03/24 0429   Level of Care: Level of care: Med-Surg Family Communication:  Disposition Plan:      Remains inpatient appropriate: No acute medical issues, awaiting placement   Procedures:    Consultants:   Neurology  Antimicrobials:  Anti-infectives (From admission, onward)    Start     Dose/Rate Route Frequency Ordered Stop   01/27/24 0807  ceFAZolin  (ANCEF ) IVPB 2g/100 mL premix        over 30 Minutes Intravenous Continuous PRN 01/27/24 0807 01/27/24 0807   01/13/24 0600  ceFAZolin  (ANCEF ) IVPB 2g/100 mL premix        2 g 200 mL/hr over 30 Minutes Intravenous On call to O.R. 01/11/24 2300 01/13/24 0557          Medications  carvedilol   25 mg Oral BID WC   enoxaparin  (LOVENOX ) injection  40 mg Subcutaneous Daily   feeding supplement  237 mL Oral TID BM    free water   50 mL Per Tube Daily   mineral oil-hydrophilic petrolatum    Topical BID   QUEtiapine   25 mg Oral QAC supper   sacubitril -valsartan   1 tablet Oral BID   spironolactone   25 mg Oral Daily      Subjective:   Amanda [name] was seen and examined today.  Sitting up in the chair, sitter at the bedside.  No acute issues.  Awaiting placement.  Objective:   Vitals:   03/18/24 1910 03/19/24 0024 03/19/24 0443 03/19/24 0731  BP: (!) 145/93 116/70 120/65 (!) 137/103  Pulse: 85 68 66 77  Resp: 18 18 18 16   Temp: 97.8 F (36.6 C) 97.7 F (36.5 C) 97.8 F (36.6 C) 98 F (36.7 C)  TempSrc: Oral Oral Oral Oral  SpO2: 98% 100% 99% 99%  Weight:      Height:        Intake/Output Summary (Last 24 hours) at 03/19/2024 1029 Last data filed at 03/18/2024 1600 Gross per 24 hour  Intake 480 ml  Output --  Net 480 ml      Wt Readings from Last 3 Encounters:  03/18/24 52.8 kg  11/15/23 58.5 kg  05/14/23 58.4 kg   Physical Exam General: Alert and oriented x 3, NAD Cardiovascular: S1 S2 clear, RRR.  Respiratory: CTAB, no wheezing, rales or rhonchi Gastrointestinal: Soft, NT, dressing intact at the previous PEG site Ext: no pedal edema bilaterally Neuro: no new deficits Psych: Normal affect   Data Reviewed:  I have personally reviewed following labs    CBC Lab Results  Component Value Date   WBC 6.9 03/15/2024   RBC 4.15 03/15/2024   HGB 12.0 03/15/2024   HCT 36.5 03/15/2024   MCV 88.0 03/15/2024   MCH 28.9 03/15/2024   PLT 228 03/15/2024   MCHC 32.9 03/15/2024   RDW 15.6 (H) 03/15/2024   LYMPHSABS 1.8 01/15/2024   MONOABS 0.9 01/15/2024   EOSABS 0.0 01/15/2024   BASOSABS 0.0 01/15/2024     Last metabolic panel Lab Results  Component Value Date   NA 144 03/15/2024   K 3.7 03/15/2024   CL 105 03/15/2024   CO2 26 03/15/2024   BUN 16 03/15/2024   CREATININE 0.87 03/15/2024   GLUCOSE 89 03/15/2024   GFRNONAA >60 03/15/2024   GFRAA >90 01/21/2014    CALCIUM 9.8 03/15/2024   PHOS 4.1 01/21/2024   PROT 6.7 03/15/2024   ALBUMIN 3.0 (L) 03/15/2024   LABGLOB 3.6 11/15/2023   BILITOT 0.6 03/15/2024   ALKPHOS 51 03/15/2024   AST 18 03/15/2024   ALT 27 03/15/2024   ANIONGAP 13 03/15/2024    CBG (last 3)  No results for input(s): GLUCAP in the last 72 hours.    Coagulation Profile: No results for input(s): INR, PROTIME in the last  168 hours.   Radiology Studies: I have personally reviewed the imaging studies  No results found.      Nydia Distance M.D. [facility] 03/19/2024, 10:29 AM  Available via Epic secure chat 7am-7pm After 7 pm, please refer to night coverage provider listed on amion.

## 2024-03-19 NOTE — Plan of Care (Signed)
  Problem: Nutritional: Goal: Maintenance of adequate nutrition will improve Outcome: Progressing Goal: Progress toward achieving an optimal weight will improve Outcome: Progressing   Problem: Skin Integrity: Goal: Risk for impaired skin integrity will decrease Outcome: Progressing   Problem: Clinical Measurements: Goal: Ability to maintain clinical measurements within normal limits will improve Outcome: Progressing Goal: Will remain free from infection Outcome: Progressing Goal: Diagnostic test results will improve Outcome: Progressing Goal: Respiratory complications will improve Outcome: Progressing Goal: Cardiovascular complication will be avoided Outcome: Progressing   Problem: Activity: Goal: Risk for activity intolerance will decrease Outcome: Progressing   Problem: Elimination: Goal: Will not experience complications related to bowel motility Outcome: Progressing Goal: Will not experience complications related to urinary retention Outcome: Progressing   Problem: Safety: Goal: Ability to remain free from injury will improve Outcome: Progressing

## 2024-03-20 NOTE — TOC Progression Note (Signed)
 Transition of Care [facility]) - Progression Note    Patient Details  Name: Amanda [name] MRN: [id number] Date of Birth: Nov 15, 1960  [facility] of Care [facility]) CM/SW Contact  Almarie CHRISTELLA [place]ie, [place] Phone Number: 03/20/2024, 12:58 PM  Clinical Narrative:   Patient continues with no bed offers for SNF. Patient also still with sitter due to intermittent agitation. Updated provided to MD. CSW to follow.    Expected Discharge Plan: Skilled Nursing Facility Barriers to Discharge: English As A Second Language Teacher, Continued Medical Work up, Inadequate or no insurance, Facility will not accept until restraint criteria met, SNF Pending bed offer               Expected Discharge Plan and Services In-house Referral: Clinical Social Work   Post Acute Care Choice: Skilled Nursing Facility Living arrangements for the past 2 months: Single Family Home                                       Social Drivers of Health (SDOH) Interventions SDOH Screenings   Food Insecurity: No Food Insecurity (07/16/2023)   Received from [facility]  Housing: Low Risk  (07/16/2023)   Received from [facility]  Transportation Needs: No Transportation Needs (07/16/2023)   Received from [facility]  Utilities: Not At Risk (07/16/2023)   Received from [facility]  Alcohol Screen: Low Risk  (05/14/2023)  Financial Resource Strain: Low Risk  (07/16/2023)   Received from [facility]  Tobacco Use: High Risk (01/26/2024)    Readmission Risk Interventions    03/26/2022   10:50 AM  Readmission Risk Prevention Plan  Transportation Screening Complete  HRI or Home Care Consult Complete  Social Work Consult for Recovery Care Planning/Counseling Complete  Palliative Care Screening Not Applicable  Medication Review Oceanographer) Referral to Pharmacy

## 2024-03-20 NOTE — Progress Notes (Signed)
 [facility]                                                                              Amanda [name], is a 63 y.o. female, DOB - 10-28-1960, FMW:[id number] Admit date - 01/03/2024    Outpatient Primary MD for the patient is Patient, No Pcp Per  LOS - 77  days  Chief Complaint  Patient presents with   Altered Mental Status       Brief summary   63 year old female with history of hypertension, cocaine abuse, diabetes type 2 who presented with lethargy, near syncope. Found to be severely hypertensive, UDS positive for cocaine, with acute intraparenchymal hemorrhage , intraventricular hemorrhage and hydrocephalus. Patient was admitted to ICU for close blood pressure management. Neurosurgery consulted. Status post right frontal ventriculostomy. EVD eventually removed on 10/3. Hospital course remarkable for dysphagia. Dietitian/speech therapy recommending PEG placement. PT/OT recommending SNF on discharge. S/o PEG placement by IR on 10/16. Tube feeds stopped 02/18/24, currently on supplements, plan to remove PEG 03/19/24 if oral intake better. On and off agitated and did require restraints.   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         - No acute issues, awaiting placement   Hypertension:  - BP stable, continue Coreg , Entresto , Aldactone   -Amlodipine  discontinued due to history of systolic CHF     Dysphagia:  Moderate malnutrition- S/P PEG placement on 10/16. Currently on dysphagia 3 diet and eating better, meeting all the nutritional demands, dietitian on board, tube feeds stopped 02/18/2024.  Continue protein supplements. -PEG removed on 03/14/24 - Tolerating diet without difficulty, continue dysphagia 3 diet    Chronic combined systolic/diastolic CHF:  -  Compensated, continue Entresto , spironolactone , Coreg   - Not on aspirin  due to recent intraparenchymal hemorrhage     Type 2 diabetes:  Recent A1c of 6.6.   Cocaine abuse:  Consulted TOC for cocaine cessation resources.   S/p fall- -Continue fall, delirium precautions. -Continue sitter  - Per PT, pt cannot walk on her own and she is impulsive and unaware of her deficits. Not safe for d/c home - Continue Seroquel  - Refused CT head.  Moderate protein calorie malnutrition Nutrition Problem: Moderate Malnutrition Etiology: social / environmental circumstances (polysubstance abuse) Signs/Symptoms: mild muscle depletion, moderate fat depletion, mild fat depletion, moderate muscle depletion Interventions: Ensure Enlive (each supplement provides 350kcal and 20 grams of protein), MVI  Estimated body mass index is 19.37 kg/m as calculated from the following:   Height as of this encounter: 5' 5 (1.651 m).   Weight as of this encounter: 52.8 kg.  Code Status: Full code DVT Prophylaxis:  enoxaparin  (LOVENOX ) injection 40 mg Start: 02/21/24 1430 Place and maintain sequential compression device Start: 01/03/24 0611 SCDs Start: 01/03/24 0429   Level of Care: Level of care: Med-Surg Family Communication:  Disposition Plan:      Remains inpatient appropriate: No acute medical issues, awaiting placement   Procedures:    Consultants:   Neurology  Antimicrobials:   Anti-infectives (From admission, onward)    Start     Dose/Rate Route Frequency Ordered Stop   01/27/24 0807  ceFAZolin  (ANCEF ) IVPB 2g/100 mL premix        over 30 Minutes Intravenous Continuous PRN 01/27/24 0807 01/27/24 0807   01/13/24 0600  ceFAZolin  (ANCEF ) IVPB 2g/100 mL premix        2 g 200 mL/hr over 30 Minutes Intravenous On call to O.R. 01/11/24 2300 01/13/24 0557          Medications  carvedilol   25 mg Oral BID WC   enoxaparin  (LOVENOX ) injection  40 mg Subcutaneous Daily   feeding supplement  237  mL Oral TID BM   free water   50 mL Per Tube Daily   mineral oil-hydrophilic petrolatum    Topical BID   QUEtiapine   25 mg Oral QAC supper   sacubitril -valsartan   1 tablet Oral BID   spironolactone   25 mg Oral Daily      Subjective:   Amanda [name] was seen and examined today.  Awaiting SNF, no acute complaints.    Objective:   Vitals:   03/19/24 2031 03/20/24 0010 03/20/24 0400 03/20/24 0833  BP: 118/77 124/81 (!) 144/94 (!) 119/96  Pulse: 69 65 76 92  Resp: 16 20 19 19   Temp: (!) 97.4 F (36.3 C) 97.6 F (36.4 C) 97.8 F (36.6 C) 98.4 F (36.9 C)  TempSrc: Axillary Axillary Oral Oral  SpO2: 96% 94% 100% 98%  Weight:      Height:       No intake or output data in the 24 hours ending 03/20/24 1247     Wt Readings from Last 3 Encounters:  03/18/24 52.8 kg  11/15/23 58.5 kg  05/14/23 58.4 kg   Physical Exam General: Alert and oriented, NAD Cardiovascular: S1 S2 clear, RRR.  Respiratory: CTAB, no wheezing, rales or rhonchi Gastrointestinal: Soft, nontender, nondistended, NBS, dressing intact on previous PEG site Ext: no pedal edema bilaterally Neuro: no new deficits Psych: Normal affect    Data Reviewed:  I have personally reviewed following labs    CBC Lab Results  Component Value Date   WBC 6.9 03/15/2024   RBC 4.15 03/15/2024   HGB 12.0 03/15/2024   HCT 36.5 03/15/2024   MCV 88.0 03/15/2024   MCH 28.9 03/15/2024   PLT 228 03/15/2024   MCHC 32.9 03/15/2024   RDW 15.6 (H) 03/15/2024   LYMPHSABS 1.8 01/15/2024   MONOABS 0.9 01/15/2024   EOSABS 0.0 01/15/2024   BASOSABS 0.0 01/15/2024     Last metabolic panel Lab Results  Component Value Date   NA 144 03/15/2024   K 3.7 03/15/2024   CL 105 03/15/2024   CO2 26 03/15/2024   BUN 16 03/15/2024   CREATININE 0.87 03/15/2024   GLUCOSE 89 03/15/2024   GFRNONAA >60 03/15/2024   GFRAA >90 01/21/2014   CALCIUM 9.8 03/15/2024   PHOS 4.1 01/21/2024   PROT 6.7 03/15/2024   ALBUMIN 3.0 (L) 03/15/2024    LABGLOB 3.6 11/15/2023   BILITOT 0.6 03/15/2024   ALKPHOS 51 03/15/2024   AST 18 03/15/2024   ALT 27 03/15/2024   ANIONGAP 13 03/15/2024    CBG (last 3)  Recent Labs    03/19/24 1209  GLUCAP 132*      Coagulation Profile: No results for input(s): INR, PROTIME in the last 168 hours.   Radiology Studies: I have personally reviewed the imaging studies  No results found.  Nydia Distance M.D. [facility] 03/20/2024, 12:47 PM  Available via Epic secure chat 7am-7pm After 7 pm, please refer to night coverage provider listed on amion.

## 2024-03-20 NOTE — Plan of Care (Signed)
  Problem: Education: Goal: Ability to describe self-care measures that may prevent or decrease complications (Diabetes Survival Skills Education) will improve Outcome: Progressing   Problem: Coping: Goal: Ability to adjust to condition or change in health will improve Outcome: Progressing   Problem: Fluid Volume: Goal: Ability to maintain a balanced intake and output will improve Outcome: Progressing   Problem: Health Behavior/Discharge Planning: Goal: Ability to identify and utilize available resources and services will improve Outcome: Progressing Goal: Ability to manage health-related needs will improve Outcome: Progressing   Problem: Metabolic: Goal: Ability to maintain appropriate glucose levels will improve Outcome: Progressing   Problem: Nutritional: Goal: Maintenance of adequate nutrition will improve Outcome: Progressing Goal: Progress toward achieving an optimal weight will improve Outcome: Progressing   Problem: Skin Integrity: Goal: Risk for impaired skin integrity will decrease Outcome: Progressing

## 2024-03-20 NOTE — Progress Notes (Signed)
 Occupational Therapy Treatment Patient Details Name: Amanda [name] MRN: [id number] DOB: 1961-01-15 Today's Date: 03/20/2024   History of present illness 63 yo female presents to [facility] on 9/22 with lethargy and near-syncope s/p cocaine and opioid ingestion. 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 S/p R frontal ventriculostomy, burr holes 9/22. ETT 9/22-9/23. PMHx of cocaine abuse, CHF, CKD, chronic cough, HTN and DM2.   OT comments  Patient demonstrating good gains with OT with bed mobility, LB dressing, grooming standing at sink, and functional mobility. Patient performed path finding tasks with min verbal cues and cues for safety and correct walker use.  Patient will benefit from continued inpatient follow up therapy, <3 hours/day.  Acute OT to continue to follow to address established goals to facilitate DC to next venue of care.        If plan is discharge home, recommend the following:  Assistance with cooking/housework;Help with stairs or ramp for entrance;Direct supervision/assist for financial management;Direct supervision/assist for medications management;Assist for transportation;Supervision due to cognitive status;A little help with walking and/or transfers;A little help with bathing/dressing/bathroom   Equipment Recommendations  Other (comment) (defer)    Recommendations for Other Services      Precautions / Restrictions Precautions Precautions: Fall Recall of Precautions/Restrictions: Impaired Precaution/Restrictions Comments: PEG, SBP < 160, impulsive Restrictions Weight Bearing Restrictions Per Provider Order: No       Mobility Bed Mobility Overal bed mobility: Needs Assistance Bed Mobility: Supine to Sit, Sit to Supine     Supine to sit: Contact guard, HOB elevated Sit to supine: HOB elevated, Supervision        Transfers Overall transfer level: Needs  assistance Equipment used: Rolling walker (2 wheels) Transfers: Sit to/from Stand Sit to Stand: Contact guard assist           General transfer comment: CGA for balance and safety     Balance Overall balance assessment: Needs assistance Sitting-balance support: No upper extremity supported, Feet supported Sitting balance-Leahy Scale: Fair Sitting balance - Comments: seated EOB   Standing balance support: Reliant on assistive device for balance, During functional activity, Single extremity supported Standing balance-Leahy Scale: Poor Standing balance comment: reliant on sink or RW for support                           ADL either performed or assessed with clinical judgement   ADL Overall ADL's : Needs assistance/impaired     Grooming: Wash/dry hands;Wash/dry face;Contact guard assist;Standing Grooming Details (indicate cue type and reason): at sink             Lower Body Dressing: Minimal assistance;Sitting/lateral leans Lower Body Dressing Details (indicate cue type and reason): donned paper scrub pants               General ADL Comments: assistance with balance for LB dressing    Extremity/Trunk Assessment              Vision       Perception     Praxis     Communication Communication Communication: No apparent difficulties   Cognition Arousal: Alert Behavior During Therapy: Impulsive Cognition: Cognition impaired   Orientation impairments: Time, Situation Awareness: Intellectual awareness impaired Memory impairment (select all impairments): Short-term memory, Working memory Attention impairment (select first level of impairment): Sustained attention Executive functioning impairment (select all impairments): Initiation, Sequencing, Reasoning, Problem solving  Following commands: Impaired Following commands impaired: Follows one step commands with increased time      Cueing   Cueing Techniques: Verbal  cues, Gestural cues  Exercises      Shoulder Instructions       General Comments pathfinding tasks performed in hallway with min verbal cues    Pertinent Vitals/ Pain       Pain Assessment Pain Assessment: No/denies pain Pain Intervention(s): Monitored during session  Home Living                                          Prior Functioning/Environment              Frequency  Min 2X/week        Progress Toward Goals  OT Goals(current goals can now be found in the care plan section)  Progress towards OT goals: Progressing toward goals  Acute Rehab OT Goals Patient Stated Goal: none stated OT Goal Formulation: With patient Time For Goal Achievement: 03/21/24 Potential to Achieve Goals: Fair ADL Goals Pt Will Perform Eating: with set-up;sitting Pt Will Perform Grooming: with supervision;standing Pt Will Transfer to Toilet: with supervision;ambulating;regular height toilet Additional ADL Goal #1: Pt will attend to items on L side and properly orient self to sink/task at hand 75% of the time, in preparation for ADLs performed in standing. Additional ADL Goal #2: Pt will follow 2-step commands 50% of the time during ADLs and functional mobility to demonstrate improved attention.  Plan      Co-evaluation                 AM-PAC OT 6 Clicks Daily Activity     Outcome Measure   Help from another person eating meals?: A Little Help from another person taking care of personal grooming?: A Little Help from another person toileting, which includes using toliet, bedpan, or urinal?: A Little Help from another person bathing (including washing, rinsing, drying)?: A Little Help from another person to put on and taking off regular upper body clothing?: A Little Help from another person to put on and taking off regular lower body clothing?: A Little 6 Click Score: 18    End of Session Equipment Utilized During Treatment: Gait belt;Rolling walker (2  wheels)  OT Visit Diagnosis: Unsteadiness on feet (R26.81);Other abnormalities of gait and mobility (R26.89);Muscle weakness (generalized) (M62.81)   Activity Tolerance Patient tolerated treatment well   Patient Left in bed;with call bell/phone within reach;with bed alarm set   Nurse Communication Mobility status        Time: 8595-8578 OT Time Calculation (min): 17 min  Charges: OT General Charges $OT Visit: 1 Visit OT Treatments $Self Care/Home Management : 8-22 mins  Dick Laine, OTA Acute Rehabilitation Services  Office [phone]   Jeb LITTIE Laine 03/20/2024, 2:31 PM

## 2024-03-20 NOTE — Plan of Care (Signed)
  Problem: Education: Goal: Ability to describe self-care measures that may prevent or decrease complications (Diabetes Survival Skills Education) will improve Outcome: Progressing   Problem: Coping: Goal: Ability to adjust to condition or change in health will improve Outcome: Progressing   Problem: Fluid Volume: Goal: Ability to maintain a balanced intake and output will improve Outcome: Progressing   Problem: Health Behavior/Discharge Planning: Goal: Ability to identify and utilize available resources and services will improve Outcome: Progressing Goal: Ability to manage health-related needs will improve Outcome: Progressing   Problem: Metabolic: Goal: Ability to maintain appropriate glucose levels will improve Outcome: Progressing   Problem: Nutritional: Goal: Maintenance of adequate nutrition will improve Outcome: Progressing Goal: Progress toward achieving an optimal weight will improve Outcome: Progressing   Problem: Skin Integrity: Goal: Risk for impaired skin integrity will decrease Outcome: Progressing   Problem: Tissue Perfusion: Goal: Adequacy of tissue perfusion will improve Outcome: Progressing   Problem: Education: Goal: Knowledge of General Education information will improve Description: Including pain rating scale, medication(s)/side effects and non-pharmacologic comfort measures Outcome: Progressing   Problem: Health Behavior/Discharge Planning: Goal: Ability to manage health-related needs will improve Outcome: Progressing   Problem: Clinical Measurements: Goal: Ability to maintain clinical measurements within normal limits will improve Outcome: Progressing Goal: Will remain free from infection Outcome: Progressing Goal: Diagnostic test results will improve Outcome: Progressing Goal: Respiratory complications will improve Outcome: Progressing Goal: Cardiovascular complication will be avoided Outcome: Progressing   Problem: Activity: Goal:  Risk for activity intolerance will decrease Outcome: Progressing   Problem: Nutrition: Goal: Adequate nutrition will be maintained Outcome: Progressing   Problem: Coping: Goal: Level of anxiety will decrease Outcome: Progressing   Problem: Elimination: Goal: Will not experience complications related to bowel motility Outcome: Progressing Goal: Will not experience complications related to urinary retention Outcome: Progressing   Problem: Pain Managment: Goal: General experience of comfort will improve and/or be controlled Outcome: Progressing   Problem: Safety: Goal: Ability to remain free from injury will improve Outcome: Progressing   Problem: Skin Integrity: Goal: Risk for impaired skin integrity will decrease Outcome: Progressing   Problem: Education: Goal: Knowledge of disease or condition will improve Outcome: Progressing Goal: Knowledge of secondary prevention will improve (MUST DOCUMENT ALL) Outcome: Progressing Goal: Knowledge of patient specific risk factors will improve (DELETE if not current risk factor) Outcome: Progressing   Problem: Intracerebral Hemorrhage Tissue Perfusion: Goal: Complications of Intracerebral Hemorrhage will be minimized Outcome: Progressing   Problem: Coping: Goal: Will verbalize positive feelings about self Outcome: Progressing Goal: Will identify appropriate support needs Outcome: Progressing   Problem: Health Behavior/Discharge Planning: Goal: Ability to manage health-related needs will improve Outcome: Progressing Goal: Goals will be collaboratively established with patient/family Outcome: Progressing   Problem: Self-Care: Goal: Ability to participate in self-care as condition permits will improve Outcome: Progressing Goal: Verbalization of feelings and concerns over difficulty with self-care will improve Outcome: Progressing Goal: Ability to communicate needs accurately will improve Outcome: Progressing   Problem:  Nutrition: Goal: Risk of aspiration will decrease Outcome: Progressing Goal: Dietary intake will improve Outcome: Progressing

## 2024-03-21 MED ORDER — ORAL CARE MOUTH RINSE: 15.0000 mL | OROMUCOSAL | Status: DC | PRN

## 2024-03-21 NOTE — Progress Notes (Signed)
 [facility]                                                                              Amanda [name], is a 63 y.o. female, DOB - 03/17/1961, FMW:[id number] Admit date - 01/03/2024    Outpatient Primary MD for the patient is Patient, No Pcp Per  LOS - 78  days  Chief Complaint  Patient presents with   Altered Mental Status       Brief summary   63 year old female with history of hypertension, cocaine abuse, diabetes type 2 who presented with lethargy, near syncope. Found to be severely hypertensive, UDS positive for cocaine, with acute intraparenchymal hemorrhage , intraventricular hemorrhage and hydrocephalus. Patient was admitted to ICU for close blood pressure management. Neurosurgery consulted. Status post right frontal ventriculostomy. EVD eventually removed on 10/3. Hospital course remarkable for dysphagia. Dietitian/speech therapy recommending PEG placement. PT/OT recommending SNF on discharge. S/o PEG placement by IR on 10/16. Tube feeds stopped 02/18/24, currently on supplements, plan to remove PEG 03/19/24 if oral intake better. On and off agitated and did require restraints.   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         - No acute issues, awaiting placement   Hypertension:  - BP stable, continue Coreg , Entresto , Aldactone   -Amlodipine  discontinued due to history of systolic CHF     Dysphagia:  Moderate malnutrition- S/P PEG placement on 10/16. Currently on dysphagia 3 diet and eating better, meeting all the nutritional demands, dietitian on board, tube feeds stopped 02/18/2024.  Continue protein supplements. -PEG removed on 03/14/24 - Tolerating diet without difficulty, continue dysphagia 3 diet    Chronic  combined systolic/diastolic CHF:  - Compensated, continue Entresto , spironolactone , Coreg   - Not on aspirin  due to recent intraparenchymal hemorrhage     Type 2 diabetes:  Recent A1c of 6.6.   Cocaine abuse:  Consulted TOC for cocaine cessation resources.   S/p fall- -Continue fall, delirium precautions. -Continue sitter  - Per PT, pt cannot walk on her own and she is impulsive and unaware of her deficits. Not safe for d/c home - Continue Seroquel  - Refused CT head.  Moderate protein calorie malnutrition Nutrition Problem: Moderate Malnutrition Etiology: social / environmental circumstances (polysubstance abuse) Signs/Symptoms: mild muscle depletion, moderate fat depletion, mild fat depletion, moderate muscle depletion Interventions: Ensure Enlive (each supplement provides 350kcal and 20 grams of protein), MVI  Estimated body mass index is 19.37 kg/m as calculated from the following:   Height as of this encounter: 5' 5 (1.651 m).   Weight as of this encounter: 52.8 kg.  Code Status: Full code DVT Prophylaxis:  enoxaparin  (LOVENOX ) injection 40 mg Start: 02/21/24 1430 Place and maintain sequential compression device Start: 01/03/24 0611 SCDs Start: 01/03/24 0429   Level of Care: Level of care: Med-Surg Family Communication:  Disposition Plan:      Remains inpatient appropriate: No acute medical issues, awaiting placement   Procedures:  Consultants:   Neurology  Antimicrobials:   Anti-infectives (From admission, onward)    Start     Dose/Rate Route Frequency Ordered Stop   01/27/24 0807  ceFAZolin  (ANCEF ) IVPB 2g/100 mL premix        over 30 Minutes Intravenous Continuous PRN 01/27/24 0807 01/27/24 0807   01/13/24 0600  ceFAZolin  (ANCEF ) IVPB 2g/100 mL premix        2 g 200 mL/hr over 30 Minutes Intravenous On call to O.R. 01/11/24 2300 01/13/24 0557          Medications  carvedilol   25 mg Oral BID WC   enoxaparin  (LOVENOX ) injection  40 mg  Subcutaneous Daily   feeding supplement  237 mL Oral TID BM   free water   50 mL Per Tube Daily   mineral oil-hydrophilic petrolatum    Topical BID   QUEtiapine   25 mg Oral QAC supper   sacubitril -valsartan   1 tablet Oral BID   spironolactone   25 mg Oral Daily      Subjective:   Amanda [name] was seen and examined today.  No acute issues, tolerating diet, watching TV, awaiting placement.     Objective:   Vitals:   03/20/24 0833 03/20/24 2027 03/21/24 0621 03/21/24 0812  BP: (!) 119/96 108/85 (!) 138/92 130/76  Pulse: 92 79 62 83  Resp: 19 17 16 16   Temp: 98.4 F (36.9 C) 97.7 F (36.5 C) 98 F (36.7 C) 98.6 F (37 C)  TempSrc: Oral Oral Oral Oral  SpO2: 98% 100% 97% 100%  Weight:      Height:       No intake or output data in the 24 hours ending 03/21/24 1130     Wt Readings from Last 3 Encounters:  03/18/24 52.8 kg  11/15/23 58.5 kg  05/14/23 58.4 kg    Physical Exam General: Alert and oriented x 3, NAD Cardiovascular: S1 S2 clear, RRR.  Respiratory: CTAB, no wheezing, rales or rhonchi Gastrointestinal: Soft, nontender, dressing intact on previous PEG site. Ext: no pedal edema bilaterally Neuro: no new deficits Psych: No acute agitation, appropriately responding.  Data Reviewed:  I have personally reviewed following labs    CBC Lab Results  Component Value Date   WBC 6.9 03/15/2024   RBC 4.15 03/15/2024   HGB 12.0 03/15/2024   HCT 36.5 03/15/2024   MCV 88.0 03/15/2024   MCH 28.9 03/15/2024   PLT 228 03/15/2024   MCHC 32.9 03/15/2024   RDW 15.6 (H) 03/15/2024   LYMPHSABS 1.8 01/15/2024   MONOABS 0.9 01/15/2024   EOSABS 0.0 01/15/2024   BASOSABS 0.0 01/15/2024     Last metabolic panel Lab Results  Component Value Date   NA 144 03/15/2024   K 3.7 03/15/2024   CL 105 03/15/2024   CO2 26 03/15/2024   BUN 16 03/15/2024   CREATININE 0.87 03/15/2024   GLUCOSE 89 03/15/2024   GFRNONAA >60 03/15/2024   GFRAA >90 01/21/2014   CALCIUM 9.8  03/15/2024   PHOS 4.1 01/21/2024   PROT 6.7 03/15/2024   ALBUMIN 3.0 (L) 03/15/2024   LABGLOB 3.6 11/15/2023   BILITOT 0.6 03/15/2024   ALKPHOS 51 03/15/2024   AST 18 03/15/2024   ALT 27 03/15/2024   ANIONGAP 13 03/15/2024    CBG (last 3)  Recent Labs    03/19/24 1209  GLUCAP 132*      Coagulation Profile: No results for input(s): INR, PROTIME in the last 168 hours.   Radiology Studies: I have personally reviewed the  imaging studies  No results found.      Nydia Distance M.D. [facility] 03/21/2024, 11:30 AM  Available via Epic secure chat 7am-7pm After 7 pm, please refer to night coverage provider listed on amion.

## 2024-03-21 NOTE — Progress Notes (Signed)
 Physical Therapy Treatment Patient Details Name: Amanda [name] MRN: [id number] DOB: June 28, 1960 Today's Date: 03/21/2024   History of Present Illness 63 yo female presents to [facility] on 9/22 with lethargy and near-syncope s/p cocaine and opioid ingestion. 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 S/p R frontal ventriculostomy, burr holes 9/22. ETT 9/22-9/23. PMHx of cocaine abuse, CHF, CKD, chronic cough, HTN and DM2.    PT Comments  Pt tolerated treatment well today. Pt with similar presentation to previous session. Pt able to ambulate in hallway with RW CGA while actively and impulsively playing hide and seek with other staff members. No change in DC/DME recs at this time. PT will continue to follow.     If plan is discharge home, recommend the following: Supervision due to cognitive status;Assist for transportation;Help with stairs or ramp for entrance;Assistance with cooking/housework;Direct supervision/assist for medications management;Direct supervision/assist for financial management;A lot of help with walking and/or transfers;A lot of help with bathing/dressing/bathroom   Can travel by private vehicle     No  Equipment Recommendations  Rolling walker (2 wheels);BSC/3in1    Recommendations for Other Services       Precautions / Restrictions Precautions Precautions: Fall Recall of Precautions/Restrictions: Impaired Precaution/Restrictions Comments: PEG, SBP < 160, impulsive Restrictions Weight Bearing Restrictions Per Provider Order: No     Mobility  Bed Mobility Overal bed mobility: Needs Assistance Bed Mobility: Supine to Sit     Supine to sit: Contact guard, HOB elevated     General bed mobility comments: Left seated EOB with bed alarm on    Transfers Overall transfer level: Needs assistance Equipment used: Rolling walker (2 wheels) Transfers: Sit to/from Stand Sit to  Stand: Contact guard assist           General transfer comment: CGA for balance and safety    Ambulation/Gait Ambulation/Gait assistance: Contact guard assist, Min assist Gait Distance (Feet): 300 Feet Assistive device: Rolling walker (2 wheels) Gait Pattern/deviations: Step-through pattern, Decreased stride length, Trunk flexed, Staggering right Gait velocity: Dec     General Gait Details: Pt opted for RW today. Cues for proximity to RW however not adhering to commands from this PT.   Stairs             Wheelchair Mobility     Tilt Bed    Modified Rankin (Stroke Patients Only)       Balance Overall balance assessment: Needs assistance Sitting-balance support: No upper extremity supported, Feet supported Sitting balance-Leahy Scale: Fair Sitting balance - Comments: seated EOB   Standing balance support: Reliant on assistive device for balance, During functional activity, Single extremity supported Standing balance-Leahy Scale: Poor Standing balance comment: reliant on sink or RW for support                            Communication Communication Communication: No apparent difficulties  Cognition Arousal: Alert Behavior During Therapy: Impulsive                             Following commands: Impaired Following commands impaired: Follows one step commands with increased time    Cueing Cueing Techniques: Verbal cues, Gestural cues  Exercises      General Comments General comments (skin integrity, edema, etc.): VSS      Pertinent Vitals/Pain Pain Assessment Pain Assessment: No/denies pain    Home Living  Prior Function            PT Goals (current goals can now be found in the care plan section) Progress towards PT goals: Progressing toward goals    Frequency    Min 2X/week      PT Plan      Co-evaluation              AM-PAC PT 6 Clicks Mobility   Outcome  Measure  Help needed turning from your back to your side while in a flat bed without using bedrails?: A Little Help needed moving from lying on your back to sitting on the side of a flat bed without using bedrails?: A Little Help needed moving to and from a bed to a chair (including a wheelchair)?: A Little Help needed standing up from a chair using your arms (e.g., wheelchair or bedside chair)?: A Little Help needed to walk in hospital room?: A Little Help needed climbing 3-5 steps with a railing? : A Little 6 Click Score: 18    End of Session Equipment Utilized During Treatment: Gait belt Activity Tolerance: Patient tolerated treatment well Patient left: Other (comment) Nurse Communication: Mobility status;Other (comment) PT Visit Diagnosis: Unsteadiness on feet (R26.81);Difficulty in walking, not elsewhere classified (R26.2);Other abnormalities of gait and mobility (R26.89);Other symptoms and signs involving the nervous system (R29.898);Muscle weakness (generalized) (M62.81)     Time: 8870-8856 PT Time Calculation (min) (ACUTE ONLY): 14 min  Charges:    $Gait Training: 8-22 mins PT General Charges $$ ACUTE PT VISIT: 1 Visit                     Thom Ollinger B, PT, DPT Acute Rehab Services [phone]    Shemiah Rosch 03/21/2024, 1:52 PM

## 2024-03-22 NOTE — Progress Notes (Signed)
 PROGRESS NOTE  [name]  DOB: Jul 25, 1960  PCP: Amanda [name], No Pcp Per FMW:[id number]  DOA: 01/03/2024  LOS: 79 days  Hospital Day: 80  Subjective: Amanda [name] was seen and examined this morning. Pleasant middle-aged African-American female.  Not in distress.  Brief narrative: Amanda [name] is a 63 y.o. female with PMH significant for DM2, HTN, CHF, CKD, cocaine use, 9/22, Amanda [name] presented with lethargy, near syncope. Found to be severely hypertensive UDS positive for cocaine CT head showed no acute right ganglial thalamic IPH with intraventricular extension with associated early hydrocephalus. Amanda [name] was admitted to neuro ICU for close blood pressure management.  Neurology and neurosurgery were consulted.  Amanda [name] underwent right frontal ventriculostomy. EVD eventually removed on 10/3, scalp staples removed 02/01/2024.          Hospital course remarkable for dysphagia, underwent PEG tube placement by IR on 10/16.  Over the course of next several weeks, oral intake gradually improved.  PEG tube removed on 12/2 PT recommended SNF Difficult to place On and off agitated and did require restraints.   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 EVD eventually removed on 10/3,  Has residual left hemiparesis Seen by PT.  SNF recommended Awaiting placement  Dysphagia Moderate malnutrition underwent PEG tube placement by IR on 10/16.  Over the course of next several weeks, oral intake gradually improved.  PEG tube removed on 12/2 Currently on dysphagia 3 diet  Tolerating diet without difficulty Continue feeding supplement Bowel regimen with MiraLAX  as needed  Acute metabolic encephalopathy  impaired mobility  Fall Amanda [name] has impaired cognition and impaired mobility poststroke.  She  cannot walk on her own.  But she is impulsive and unaware of her deficits.  Had fall. Currently mental status stable. Currently on Seroquel  25 mg nightly, IV olanzapine  5 mg every 8 hours PRN   Chronic combined systolic and diastolic CHF  Hypertension CHF compensated, blood pressure stable  Currently on Coreg  25 mg twice daily, Entresto  97-103 mg twice daily, Aldactone  25 mg daily, IV and oral hydralazine  as needed  Type 2 diabetes mellitus A1c 6.6 in 01/01/2024 Currently on SSI/Accu-Cheks Recent Labs  Lab 03/19/24 1209  GLUCAP 132*   Cocaine abuse  Consulted TOC for cocaine cessation resources.    Nutrition Status: Nutrition Problem: Moderate Malnutrition Etiology: social / environmental circumstances (polysubstance abuse) Signs/Symptoms: mild muscle depletion, moderate fat depletion, mild fat depletion, moderate muscle depletion Interventions: Ensure Enlive (each supplement provides 350kcal and 20 grams of protein), MVI   Goals of care   Code Status: Full Code     DVT prophylaxis:  enoxaparin  (LOVENOX ) injection 40 mg Start: 02/21/24 1430 Place and maintain sequential compression device Start: 01/03/24 0611 SCDs Start: 01/03/24 0429   Antimicrobials: None Fluid: None Consultants: None Family Communication: None at bedside  Status: Inpatient Level of care:  Med-Surg   Amanda [name] is from: Home Needs to continue in-hospital care: Pending SNF    Diet:  Diet Order             DIET DYS 3 Room service appropriate? Yes with Assist; Fluid consistency: Thin  Diet effective now                   Scheduled Meds:  carvedilol   25 mg Oral BID WC   enoxaparin  (LOVENOX ) injection  40 mg Subcutaneous Daily  feeding supplement  237 mL Oral TID BM   free water   50 mL Per Tube Daily   mineral oil-hydrophilic petrolatum    Topical BID   QUEtiapine   25 mg Oral QAC supper   sacubitril -valsartan   1 tablet Oral BID   spironolactone   25 mg Oral Daily    PRN  meds: acetaminophen  **OR** acetaminophen  (TYLENOL ) oral liquid 160 mg/5 mL **OR** acetaminophen , benzonatate , hydrALAZINE , hydrALAZINE , ipratropium-albuterol , labetalol , menthol , OLANZapine , mouth rinse, polyethylene glycol, white petrolatum    Infusions:    Antimicrobials: Anti-infectives (From admission, onward)    Start     Dose/Rate Route Frequency Ordered Stop   01/27/24 0807  ceFAZolin  (ANCEF ) IVPB 2g/100 mL premix        over 30 Minutes Intravenous Continuous PRN 01/27/24 0807 01/27/24 0807   01/13/24 0600  ceFAZolin  (ANCEF ) IVPB 2g/100 mL premix        2 g 200 mL/hr over 30 Minutes Intravenous On call to O.R. 01/11/24 2300 01/13/24 0557       Objective: Vitals:   03/21/24 1937 03/22/24 0738  BP: (!) 154/89 (!) 150/91  Pulse: 72 79  Resp: 18   Temp: 98.6 F (37 C) (!) 97.5 F (36.4 C)  SpO2: 99% 97%    Intake/Output Summary (Last 24 hours) at 03/22/2024 1151 Last data filed at 03/22/2024 1024 Gross per 24 hour  Intake 120 ml  Output --  Net 120 ml   Filed Weights   03/15/24 0500 03/16/24 0500 03/18/24 0704  Weight: 53 kg 51.9 kg 52.8 kg   Weight change:  Body mass index is 19.37 kg/m.   Physical Exam: General exam: Pleasant, middle-aged African-American female.  Not in distress Skin: No rashes, lesions or ulcers. HEENT: Atraumatic, normocephalic, no obvious bleeding Lungs: Clear to auscultation bilaterally,  CVS: S1, S2, no murmur,   GI/Abd: Soft, nontender, nondistended, bowel sound present,   CNS: Alert, awake, slow to respond but oriented to place and person, struggled with time.  Left-sided weakness noted Psychiatry: Sad affect Extremities: No pedal edema, no calf tenderness,   Data Review: I have personally reviewed the laboratory data and studies available.  F/u labs ordered Unresulted Labs (From admission, onward)    None       Signed, Chapman Rota, MD [facility] 03/22/2024

## 2024-03-22 NOTE — Plan of Care (Signed)
 Problem: Education: Goal: Ability to describe self-care measures that may prevent or decrease complications (Diabetes Survival Skills Education) will improve 03/22/2024 1013 by Jenel Bobetta SAILOR, RN Outcome: Progressing 03/22/2024 1013 by Jenel Bobetta SAILOR, RN Outcome: Progressing   Problem: Coping: Goal: Ability to adjust to condition or change in health will improve 03/22/2024 1013 by Jenel Bobetta SAILOR, RN Outcome: Progressing 03/22/2024 1013 by Jenel Bobetta SAILOR, RN Outcome: Progressing   Problem: Fluid Volume: Goal: Ability to maintain a balanced intake and output will improve 03/22/2024 1013 by Jenel Bobetta SAILOR, RN Outcome: Progressing 03/22/2024 1013 by Jenel Bobetta SAILOR, RN Outcome: Progressing   Problem: Health Behavior/Discharge Planning: Goal: Ability to identify and utilize available resources and services will improve 03/22/2024 1013 by Jenel Bobetta SAILOR, RN Outcome: Progressing 03/22/2024 1013 by Jenel Bobetta SAILOR, RN Outcome: Progressing Goal: Ability to manage health-related needs will improve 03/22/2024 1013 by Jenel Bobetta SAILOR, RN Outcome: Progressing 03/22/2024 1013 by Jenel Bobetta SAILOR, RN Outcome: Progressing   Problem: Metabolic: Goal: Ability to maintain appropriate glucose levels will improve 03/22/2024 1013 by Jenel Bobetta SAILOR, RN Outcome: Progressing 03/22/2024 1013 by Jenel Bobetta SAILOR, RN Outcome: Progressing   Problem: Nutritional: Goal: Maintenance of adequate nutrition will improve 03/22/2024 1013 by Jenel Bobetta SAILOR, RN Outcome: Progressing 03/22/2024 1013 by Jenel Bobetta SAILOR, RN Outcome: Progressing Goal: Progress toward achieving an optimal weight will improve 03/22/2024 1013 by Jenel Bobetta SAILOR, RN Outcome: Progressing 03/22/2024 1013 by Jenel Bobetta SAILOR, RN Outcome: Progressing   Problem: Skin Integrity: Goal: Risk for impaired skin integrity will decrease 03/22/2024 1013 by Jenel Bobetta SAILOR, RN Outcome: Progressing 03/22/2024 1013 by Jenel Bobetta SAILOR, RN Outcome: Progressing   Problem: Tissue Perfusion: Goal: Adequacy of tissue perfusion will improve 03/22/2024 1013 by Jenel Bobetta SAILOR, RN Outcome: Progressing 03/22/2024 1013 by Jenel Bobetta SAILOR, RN Outcome: Progressing   Problem: Education: Goal: Knowledge of General Education information will improve Description: Including pain rating scale, medication(s)/side effects and non-pharmacologic comfort measures 03/22/2024 1013 by Jenel Bobetta SAILOR, RN Outcome: Progressing 03/22/2024 1013 by Jenel Bobetta SAILOR, RN Outcome: Progressing   Problem: Health Behavior/Discharge Planning: Goal: Ability to manage health-related needs will improve 03/22/2024 1013 by Jenel Bobetta SAILOR, RN Outcome: Progressing 03/22/2024 1013 by Jenel Bobetta SAILOR, RN Outcome: Progressing   Problem: Clinical Measurements: Goal: Ability to maintain clinical measurements within normal limits will improve 03/22/2024 1013 by Jenel Bobetta SAILOR, RN Outcome: Progressing 03/22/2024 1013 by Jenel Bobetta SAILOR, RN Outcome: Progressing Goal: Will remain free from infection 03/22/2024 1013 by Jenel Bobetta SAILOR, RN Outcome: Progressing 03/22/2024 1013 by Jenel Bobetta SAILOR, RN Outcome: Progressing Goal: Diagnostic test results will improve 03/22/2024 1013 by Jenel Bobetta SAILOR, RN Outcome: Progressing 03/22/2024 1013 by Jenel Bobetta SAILOR, RN Outcome: Progressing Goal: Respiratory complications will improve 03/22/2024 1013 by Jenel Bobetta SAILOR, RN Outcome: Progressing 03/22/2024 1013 by Jenel Bobetta SAILOR, RN Outcome: Progressing Goal: Cardiovascular complication will be avoided 03/22/2024 1013 by Jenel Bobetta SAILOR, RN Outcome: Progressing 03/22/2024 1013 by Jenel Bobetta SAILOR, RN Outcome: Progressing   Problem: Activity: Goal: Risk for activity intolerance will decrease 03/22/2024 1013 by Jenel Bobetta SAILOR, RN Outcome: Progressing 03/22/2024 1013 by Jenel Bobetta SAILOR, RN Outcome: Progressing   Problem: Nutrition: Goal: Adequate  nutrition will be maintained 03/22/2024 1013 by Jenel Bobetta SAILOR, RN Outcome: Progressing 03/22/2024 1013 by Jenel Bobetta SAILOR, RN Outcome: Progressing   Problem: Coping: Goal: Level of anxiety will decrease 03/22/2024 1013 by Jenel Bobetta SAILOR, RN Outcome: Progressing 03/22/2024 1013 by Jenel,  Bobetta SAILOR, RN Outcome: Progressing   Problem: Elimination: Goal: Will not experience complications related to bowel motility 03/22/2024 1013 by Jenel Bobetta SAILOR, RN Outcome: Progressing 03/22/2024 1013 by Jenel Bobetta SAILOR, RN Outcome: Progressing Goal: Will not experience complications related to urinary retention 03/22/2024 1013 by Jenel Bobetta SAILOR, RN Outcome: Progressing 03/22/2024 1013 by Jenel Bobetta SAILOR, RN Outcome: Progressing   Problem: Pain Managment: Goal: General experience of comfort will improve and/or be controlled 03/22/2024 1013 by Jenel Bobetta SAILOR, RN Outcome: Progressing 03/22/2024 1013 by Jenel Bobetta SAILOR, RN Outcome: Progressing   Problem: Safety: Goal: Ability to remain free from injury will improve 03/22/2024 1013 by Jenel Bobetta SAILOR, RN Outcome: Progressing 03/22/2024 1013 by Jenel Bobetta SAILOR, RN Outcome: Progressing   Problem: Skin Integrity: Goal: Risk for impaired skin integrity will decrease 03/22/2024 1013 by Jenel Bobetta SAILOR, RN Outcome: Progressing 03/22/2024 1013 by Jenel Bobetta SAILOR, RN Outcome: Progressing   Problem: Education: Goal: Knowledge of disease or condition will improve 03/22/2024 1013 by Jenel Bobetta SAILOR, RN Outcome: Progressing 03/22/2024 1013 by Jenel Bobetta SAILOR, RN Outcome: Progressing Goal: Knowledge of secondary prevention will improve (MUST DOCUMENT ALL) 03/22/2024 1013 by Jenel Bobetta SAILOR, RN Outcome: Progressing 03/22/2024 1013 by Jenel Bobetta SAILOR, RN Outcome: Progressing Goal: Knowledge of patient specific risk factors will improve (DELETE if not current risk factor) 03/22/2024 1013 by Jenel Bobetta SAILOR, RN Outcome:  Progressing 03/22/2024 1013 by Jenel Bobetta SAILOR, RN Outcome: Progressing   Problem: Intracerebral Hemorrhage Tissue Perfusion: Goal: Complications of Intracerebral Hemorrhage will be minimized 03/22/2024 1013 by Jenel Bobetta SAILOR, RN Outcome: Progressing 03/22/2024 1013 by Jenel Bobetta SAILOR, RN Outcome: Progressing   Problem: Coping: Goal: Will verbalize positive feelings about self 03/22/2024 1013 by Jenel Bobetta SAILOR, RN Outcome: Progressing 03/22/2024 1013 by Jenel Bobetta SAILOR, RN Outcome: Progressing Goal: Will identify appropriate support needs 03/22/2024 1013 by Jenel Bobetta SAILOR, RN Outcome: Progressing 03/22/2024 1013 by Jenel Bobetta SAILOR, RN Outcome: Progressing   Problem: Health Behavior/Discharge Planning: Goal: Ability to manage health-related needs will improve 03/22/2024 1013 by Jenel Bobetta SAILOR, RN Outcome: Progressing 03/22/2024 1013 by Jenel Bobetta SAILOR, RN Outcome: Progressing Goal: Goals will be collaboratively established with patient/family 03/22/2024 1013 by Jenel Bobetta SAILOR, RN Outcome: Progressing 03/22/2024 1013 by Jenel Bobetta SAILOR, RN Outcome: Progressing   Problem: Self-Care: Goal: Ability to participate in self-care as condition permits will improve 03/22/2024 1013 by Jenel Bobetta SAILOR, RN Outcome: Progressing 03/22/2024 1013 by Jenel Bobetta SAILOR, RN Outcome: Progressing Goal: Verbalization of feelings and concerns over difficulty with self-care will improve 03/22/2024 1013 by Jenel Bobetta SAILOR, RN Outcome: Progressing 03/22/2024 1013 by Jenel Bobetta SAILOR, RN Outcome: Progressing Goal: Ability to communicate needs accurately will improve 03/22/2024 1013 by Jenel Bobetta SAILOR, RN Outcome: Progressing 03/22/2024 1013 by Jenel Bobetta SAILOR, RN Outcome: Progressing   Problem: Nutrition: Goal: Risk of aspiration will decrease 03/22/2024 1013 by Jenel Bobetta SAILOR, RN Outcome: Progressing 03/22/2024 1013 by Jenel Bobetta SAILOR, RN Outcome: Progressing Goal: Dietary intake  will improve 03/22/2024 1013 by Jenel Bobetta SAILOR, RN Outcome: Progressing 03/22/2024 1013 by Jenel Bobetta SAILOR, RN Outcome: Progressing

## 2024-03-23 NOTE — Progress Notes (Signed)
 Occupational Therapy Treatment Patient Details Name: Amanda [name] MRN: [id number] DOB: 09/25/1960 Today's Date: 03/23/2024   History of present illness 63 yo female presents to [facility] on 9/22 with lethargy and near-syncope s/p cocaine and opioid ingestion. 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 S/p R frontal ventriculostomy, burr holes 9/22. ETT 9/22-9/23. S/p EVD removal 10/3. PEG tube removed 12/2 following improved p.o. intake. PMHx of cocaine abuse, CHF, CKD, chronic cough, HTN and DM2.   OT comments  Pt seen today for OT treatment, goals updated per OT POC. Pt alert and pleasant, very engaged and participatory with OT. Overall stability and strength improving. L inattention remains but has much improved from prior sessions. Continue to address functional cognition and how that impacts ADLs and IADLs. Functionally, she is CGA for functional transfers and mobility with RW, needs min A for seated LB ADLs and mod cognitive cueing for wayfinding and locating 3/3 objects on nursing floor. Remains rather distractible. She would continue to benefit from ongoing acute OT services to facilitate return to PLOF and safe discharge. OT to continue to follow.      If plan is discharge home, recommend the following:  Assistance with cooking/housework;Help with stairs or ramp for entrance;Direct supervision/assist for financial management;Direct supervision/assist for medications management;Assist for transportation;Supervision due to cognitive status;A little help with walking and/or transfers;A little help with bathing/dressing/bathroom   Equipment Recommendations  Other (comment) (defer)    Recommendations for Other Services      Precautions / Restrictions Precautions Precautions: Fall Recall of Precautions/Restrictions: Impaired Precaution/Restrictions Comments: PEG, SBP < 160,  impulsive Restrictions Weight Bearing Restrictions Per Provider Order: No       Mobility Bed Mobility Overal bed mobility: Needs Assistance Bed Mobility: Supine to Sit     Supine to sit: Contact guard, HOB elevated, Used rails Sit to supine: Contact guard assist, HOB elevated, Used rails   General bed mobility comments: incr effort to rise LE's back into bed    Transfers Overall transfer level: Needs assistance Equipment used: Rolling walker (2 wheels) Transfers: Sit to/from Stand Sit to Stand: Contact guard assist           General transfer comment: Stood from bed with VC for hand placement.     Balance Overall balance assessment: Needs assistance Sitting-balance support: No upper extremity supported, Feet supported Sitting balance-Leahy Scale: Fair Sitting balance - Comments: seated EOB, no overt LOB   Standing balance support: Reliant on assistive device for balance, During functional activity, Bilateral upper extremity supported Standing balance-Leahy Scale: Poor Standing balance comment: reliant on RW, although dynamic balance is improving                           ADL either performed or assessed with clinical judgement   ADL Overall ADL's : Needs assistance/impaired Eating/Feeding: Set up;Sitting Eating/Feeding Details (indicate cue type and reason): sipping from cup of water  without spillage                 Lower Body Dressing: Contact guard assist;Minimal assistance;Sitting/lateral leans Lower Body Dressing Details (indicate cue type and reason): adjusting B socks via forward reach method at EOB             Functional mobility during ADLs: Contact guard assist;Cueing for safety;Cueing for sequencing;Rolling walker (2 wheels) General ADL Comments: cognitive cueing    Extremity/Trunk Assessment  Vision       Perception Perception Perception-Other Comments: L inattention improving, but still present   Praxis      Communication Communication Communication: No apparent difficulties Factors Affecting Communication: Reduced clarity of speech (hypophonic at times)   Cognition Arousal: Alert Behavior During Therapy: Impulsive Cognition: Cognition impaired     Awareness: Intellectual awareness impaired Memory impairment (select all impairments): Short-term memory, Working memory Attention impairment (select first level of impairment): Sustained attention Executive functioning impairment (select all impairments): Initiation, Sequencing, Reasoning, Problem solving OT - Cognition Comments: remains highly distractable                 Following commands: Impaired Following commands impaired: Follows one step commands with increased time      Cueing   Cueing Techniques: Verbal cues, Gestural cues  Exercises Other Exercises Other Exercises: Way finding in hallway, pt able to successfully navigate herself back to room (suspect due to familiarity of 3W floor, not because she was able to denote how to read/utilize signage in hallway). Anticipate incr difficulty with wayfinding in unfamiliar area. Other Exercises: Pt able to locate 3/3 items in hall, although required mod reminder cues to recall third object to locate.    Shoulder Instructions       General Comments VSS throughout    Pertinent Vitals/ Pain       Pain Assessment Pain Assessment: Faces Faces Pain Scale: No hurt  Home Living                                          Prior Functioning/Environment              Frequency  Min 2X/week        Progress Toward Goals  OT Goals(current goals can now be found in the care plan section)  Progress towards OT goals: Progressing toward goals  Acute Rehab OT Goals Time For Goal Achievement: 04/11/24  Plan      Co-evaluation                 AM-PAC OT 6 Clicks Daily Activity     Outcome Measure   Help from another person eating meals?:  None Help from another person taking care of personal grooming?: A Little Help from another person toileting, which includes using toliet, bedpan, or urinal?: A Little Help from another person bathing (including washing, rinsing, drying)?: A Little Help from another person to put on and taking off regular upper body clothing?: A Little Help from another person to put on and taking off regular lower body clothing?: A Little 6 Click Score: 19    End of Session Equipment Utilized During Treatment: Gait belt;Rolling walker (2 wheels)  OT Visit Diagnosis: Unsteadiness on feet (R26.81);Other abnormalities of gait and mobility (R26.89);Muscle weakness (generalized) (M62.81)   Activity Tolerance Patient tolerated treatment well   Patient Left in bed;with call bell/phone within reach;with bed alarm set   Nurse Communication Mobility status        Time: 8886-8861 OT Time Calculation (min): 25 min  Charges: OT General Charges $OT Visit: 1 Visit OT Treatments $Self Care/Home Management : 8-22 mins $Therapeutic Activity: 8-22 mins  Deshondra Worst M. [place], OTR/L [facility] Acute Rehabilitation Services [phone] Secure Chat Preferred  Carol Theys 03/23/2024, 1:49 PM

## 2024-03-23 NOTE — Progress Notes (Signed)
 Pt refused lovenox  shot. MD notified.   Amado GORMAN Arabia, RN

## 2024-03-23 NOTE — Progress Notes (Signed)
 PROGRESS NOTE  Amanda [name]  DOB: 28-Oct-1960  PCP: Patient, No Pcp Per FMW:[id number]  DOA: 01/03/2024  LOS: 80 days  Hospital Day: 81  Subjective: Patient was seen and examined this morning. Hemodynamically stable Was seen ambulating on the hallway. No acute distress.  No new symptoms.  Brief narrative: Amanda [name] is a 63 y.o. female with PMH significant for DM2, HTN, CHF, CKD, cocaine use, 9/22, patient presented with lethargy, near syncope. Found to be severely hypertensive UDS positive for cocaine CT head showed no acute right ganglial thalamic IPH with intraventricular extension with associated early hydrocephalus. Patient was admitted to neuro ICU for close blood pressure management.  Neurology and neurosurgery were consulted.  Patient underwent right frontal ventriculostomy. EVD eventually removed on 10/3, scalp staples removed 02/01/2024.          Hospital course remarkable for dysphagia, underwent PEG tube placement by IR on 10/16.  Over the course of next several weeks, oral intake gradually improved.  PEG tube removed on 12/2 PT recommended SNF Difficult to place On and off agitated and did require restraints.   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 EVD eventually removed on 10/3,  Has residual left hemiparesis Seen by PT.  SNF recommended Awaiting placement  Dysphagia Moderate malnutrition underwent PEG tube placement by IR on 10/16.  Over the course of next several weeks, oral intake gradually improved.  PEG tube removed on 12/2 Currently on dysphagia 3 diet  Tolerating diet without difficulty Continue feeding supplement Bowel regimen with MiraLAX  as needed  Acute metabolic encephalopathy  impaired mobility  Fall Patient has impaired cognition and  impaired mobility poststroke.  She cannot walk on her own.  But she is impulsive and unaware of her deficits.  Had fall. Currently mental status stable. Currently on Seroquel  25 mg nightly, IV olanzapine  5 mg every 8 hours PRN   Chronic combined systolic and diastolic CHF  Hypertension CHF compensated, blood pressure stable  Currently on Coreg  25 mg twice daily, Entresto  97-103 mg twice daily, Aldactone  25 mg daily, IV and oral hydralazine  as needed  Type 2 diabetes mellitus A1c 6.6 in 01/01/2024 Currently on SSI/Accu-Cheks Recent Labs  Lab 03/19/24 1209  GLUCAP 132*   Cocaine abuse  Consulted TOC for cocaine cessation resources.    Nutrition Status: Nutrition Problem: Moderate Malnutrition Etiology: social / environmental circumstances (polysubstance abuse) Signs/Symptoms: mild muscle depletion, moderate fat depletion, mild fat depletion, moderate muscle depletion Interventions: Ensure Enlive (each supplement provides 350kcal and 20 grams of protein), MVI   Goals of care   Code Status: Full Code     DVT prophylaxis:  enoxaparin  (LOVENOX ) injection 40 mg Start: 02/21/24 1430 Place and maintain sequential compression device Start: 01/03/24 0611 SCDs Start: 01/03/24 0429   Antimicrobials: None Fluid: None Consultants: None Family Communication: None at bedside  Status: Inpatient Level of care:  Med-Surg   Patient is from: Home Needs to continue in-hospital care: Pending SNF    Diet:  Diet Order             DIET DYS 3 Room service appropriate? Yes with Assist; Fluid consistency: Thin  Diet effective now                   Scheduled Meds:  carvedilol   25 mg Oral BID WC   enoxaparin  (LOVENOX )  injection  40 mg Subcutaneous Daily   feeding supplement  237 mL Oral TID BM   free water   50 mL Per Tube Daily   mineral oil-hydrophilic petrolatum    Topical BID   QUEtiapine   25 mg Oral QAC supper   sacubitril -valsartan   1 tablet Oral BID   spironolactone   25 mg  Oral Daily    PRN meds: acetaminophen  **OR** acetaminophen  (TYLENOL ) oral liquid 160 mg/5 mL **OR** acetaminophen , benzonatate , hydrALAZINE , hydrALAZINE , ipratropium-albuterol , labetalol , menthol , OLANZapine , mouth rinse, polyethylene glycol, white petrolatum    Infusions:    Antimicrobials: Anti-infectives (From admission, onward)    Start     Dose/Rate Route Frequency Ordered Stop   01/27/24 0807  ceFAZolin  (ANCEF ) IVPB 2g/100 mL premix        over 30 Minutes Intravenous Continuous PRN 01/27/24 0807 01/27/24 0807   01/13/24 0600  ceFAZolin  (ANCEF ) IVPB 2g/100 mL premix        2 g 200 mL/hr over 30 Minutes Intravenous On call to O.R. 01/11/24 2300 01/13/24 0557       Objective: Vitals:   03/23/24 0816 03/23/24 1154  BP: 123/75 (!) 124/94  Pulse: 77 82  Resp: 18 18  Temp: 97.9 F (36.6 C) 98.2 F (36.8 C)  SpO2: 98% 97%    Intake/Output Summary (Last 24 hours) at 03/23/2024 1343 Last data filed at 03/23/2024 0800 Gross per 24 hour  Intake 120 ml  Output --  Net 120 ml   Filed Weights   03/15/24 0500 03/16/24 0500 03/18/24 0704  Weight: 53 kg 51.9 kg 52.8 kg   Weight change:  Body mass index is 19.37 kg/m.   Physical Exam: General exam: Pleasant, middle-aged African-American female.  Not in distress Skin: No rashes, lesions or ulcers. HEENT: Atraumatic, normocephalic, no obvious bleeding Lungs: Clear to auscultation bilaterally,  CVS: S1, S2, no murmur,   GI/Abd: Soft, nontender, nondistended, bowel sound present,   CNS: Alert, awake, slow to respond but oriented to place and person, struggled with time.  Was seen ambulating on the hallway Psychiatry: Mood appropriate Extremities: No pedal edema, no calf tenderness,   Data Review: I have personally reviewed the laboratory data and studies available.  F/u labs ordered Unresulted Labs (From admission, onward)    None       Signed, Chapman Rota, MD [facility] 03/23/2024

## 2024-03-24 LAB — GLUCOSE, CAPILLARY: Glucose-Capillary: 193 mg/dL — ABNORMAL HIGH (ref 70–99)

## 2024-03-24 NOTE — Progress Notes (Signed)
 PROGRESS NOTE  Amanda [name]  DOB: 1960/09/01  PCP: Patient, No Pcp Per FMW:[id number]  DOA: 01/03/2024  LOS: 81 days  Hospital Day: 82  Subjective: Patient was seen and examined this morning. Lying down in bed.  Not in distress. Hemodynamically stable, breathing on room air Pending placement  Brief narrative: Amanda [name] is a 63 y.o. female with PMH significant for DM2, HTN, CHF, CKD, cocaine use, 9/22, patient presented with lethargy, near syncope. Found to be severely hypertensive UDS positive for cocaine CT head showed no acute right ganglial thalamic IPH with intraventricular extension with associated early hydrocephalus. Patient was admitted to neuro ICU for close blood pressure management.  Neurology and neurosurgery were consulted.  Patient underwent right frontal ventriculostomy. EVD eventually removed on 10/3, scalp staples removed 02/01/2024.          Hospital course remarkable for dysphagia, underwent PEG tube placement by IR on 10/16.  Over the course of next several weeks, oral intake gradually improved.  PEG tube removed on 12/2 PT recommended SNF Difficult to place On and off agitated and did require restraints.   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 EVD eventually removed on 10/3,  Has residual left hemiparesis Seen by PT.  SNF recommended. Awaiting placement  Dysphagia Moderate malnutrition underwent PEG tube placement by IR on 10/16.  Over the course of next several weeks, oral intake gradually improved.  PEG tube removed on 12/2 Currently on dysphagia 3 diet  Tolerating diet without difficulty Continue feeding supplement Bowel regimen with MiraLAX  as needed  Acute metabolic encephalopathy  impaired mobility  Fall Patient has impaired  cognition and impaired mobility poststroke.  She cannot walk on her own.  But she is impulsive and unaware of her deficits.  Had fall. Currently mental status stable. Currently on Seroquel  25 mg nightly, IV olanzapine  5 mg every 8 hours PRN   Chronic combined systolic and diastolic CHF  Hypertension CHF compensated, blood pressure stable  Currently on Coreg  25 mg twice daily, Entresto  97-103 mg twice daily, Aldactone  25 mg daily, IV and oral hydralazine  as needed  Type 2 diabetes mellitus A1c 6.6 in 01/01/2024 Currently on SSI/Accu-Cheks Recent Labs  Lab 03/19/24 1209  GLUCAP 132*   Cocaine abuse  Consulted TOC for cocaine cessation resources.    Nutrition Status: Nutrition Problem: Moderate Malnutrition Etiology: social / environmental circumstances (polysubstance abuse) Signs/Symptoms: mild muscle depletion, moderate fat depletion, mild fat depletion, moderate muscle depletion Interventions: Ensure Enlive (each supplement provides 350kcal and 20 grams of protein), MVI   Goals of care   Code Status: Full Code     DVT prophylaxis:  enoxaparin  (LOVENOX ) injection 40 mg Start: 02/21/24 1430 Place and maintain sequential compression device Start: 01/03/24 0611 SCDs Start: 01/03/24 0429   Antimicrobials: None Fluid: None Consultants: None Family Communication: None at bedside  Status: Inpatient Level of care:  Med-Surg   Patient is from: Home Needs to continue in-hospital care: Pending SNF    Diet:  Diet Order             DIET DYS 3 Room service appropriate? Yes with Assist; Fluid consistency: Thin  Diet effective now                   Scheduled Meds:  carvedilol   25 mg Oral BID WC   enoxaparin  (  LOVENOX ) injection  40 mg Subcutaneous Daily   feeding supplement  237 mL Oral TID BM   free water   50 mL Per Tube Daily   mineral oil-hydrophilic petrolatum    Topical BID   QUEtiapine   25 mg Oral QAC supper   sacubitril -valsartan   1 tablet Oral BID    spironolactone   25 mg Oral Daily    PRN meds: acetaminophen  **OR** acetaminophen  (TYLENOL ) oral liquid 160 mg/5 mL **OR** acetaminophen , benzonatate , hydrALAZINE , hydrALAZINE , ipratropium-albuterol , labetalol , menthol , OLANZapine , mouth rinse, polyethylene glycol, white petrolatum    Infusions:    Antimicrobials: Anti-infectives (From admission, onward)    Start     Dose/Rate Route Frequency Ordered Stop   01/27/24 0807  ceFAZolin  (ANCEF ) IVPB 2g/100 mL premix        over 30 Minutes Intravenous Continuous PRN 01/27/24 0807 01/27/24 0807   01/13/24 0600  ceFAZolin  (ANCEF ) IVPB 2g/100 mL premix        2 g 200 mL/hr over 30 Minutes Intravenous On call to O.R. 01/11/24 2300 01/13/24 0557       Objective: Vitals:   03/23/24 1620 03/23/24 1933  BP: 139/84 (!) 145/88  Pulse: 79 78  Resp: 18 18  Temp: 98.3 F (36.8 C) 98.2 F (36.8 C)  SpO2: 98% 100%    Intake/Output Summary (Last 24 hours) at 03/24/2024 0917 Last data filed at 03/23/2024 1800 Gross per 24 hour  Intake 360 ml  Output --  Net 360 ml   Filed Weights   03/15/24 0500 03/16/24 0500 03/18/24 0704  Weight: 53 kg 51.9 kg 52.8 kg   Weight change:  Body mass index is 19.37 kg/m.   Physical Exam: General exam: Pleasant, middle-aged African-American female.  Not in distress Skin: No rashes, lesions or ulcers. HEENT: Atraumatic, normocephalic, no obvious bleeding Lungs: Clear to auscultation bilaterally,  CVS: S1, S2, no murmur,   GI/Abd: Soft, nontender, nondistended, bowel sound present,   CNS: Alert, awake, slow to respond.  Impulsive but able to ambulate. Psychiatry: Mood appropriate Extremities: No pedal edema, no calf tenderness,   Data Review: I have personally reviewed the laboratory data and studies available.  F/u labs ordered Unresulted Labs (From admission, onward)    None       Signed, Chapman Rota, MD [facility] 03/24/2024

## 2024-03-24 NOTE — Progress Notes (Signed)
 Speech Language Pathology Treatment: Cognitive-Linguistic  Patient Details Name: Amanda [name] MRN: [id number] DOB: [birth year] Today's Date: 03/24/2024 Time: 8946-8894 SLP Time Calculation (min) (ACUTE ONLY): 12 min  Assessment / Plan / Recommendation Clinical Impression  SLP conducted skilled therapy session targeting cognitive goals. Given duration of time spent on SLP caseload, patient appropriate for cognitive reevaluation to determine current status. SLP administered SLUMS examination to determine current impairment level across various domains. Patient scored 9/30 indicating ongoing severe deficits in memory, attention, problem solving, and awareness. She also demonstrated poor frustration tolerance this date, limiting participation across a variety of tasks due to unwillingness to try. Patient was left in room with call bell in reach and alarm set. SLP will continue to target goals per plan of care.     HPI HPI: [name] with polysubstance/cocaine abuse 3 hrs before presentation, who called EMS for AMS, lethargy and near syncope. Her condition deteriorated (unable to protect her airway and more lethargic) and got intubated. 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 S/o PEG placement by IR on 10/16.  Tube feeds stopped 02/18/24, currently on supplements, plan to remove PEG 03/19/24 if oral intake better. HTN, CHF, DM-2.      SLP Plan  Continue with current plan of care        Swallow Evaluation Recommendations         Recommendations                     Oral care BID   Frequent or constant Supervision/Assistance Cognitive communication deficit (M58.158)     Continue with current plan of care    Rosina Downy, M.A., CCC-SLP  Rosina LABOR Reginna Sermeno  03/24/2024, 1:34 PM

## 2024-03-24 NOTE — TOC Progression Note (Signed)
 Transition of Care [facility]) - Progression Note    Patient Details  Name: Amanda [name] MRN: [id number] Date of Birth: [birth year]  [facility] CM/SW Contact  Almarie CHRISTELLA Goodie, [place] Phone Number: 03/24/2024, 12:01 PM  Clinical Narrative:   CSW following for disposition. Patient now without sitter since 12/8. Patient with no offers. CSW spoke with [facility] about any bed availability, they cannot offer. CSW to follow.    Expected Discharge Plan: Skilled Nursing Facility Barriers to Discharge: English As A Second Language Teacher, Continued Medical Work up, Inadequate or no insurance, Facility will not accept until restraint criteria met, SNF Pending bed offer               Expected Discharge Plan and Services In-house Referral: Clinical Social Work   Post Acute Care Choice: Skilled Nursing Facility Living arrangements for the past 2 months: Single Family Home                                       Social Drivers of Health (SDOH) Interventions SDOH Screenings   Food Insecurity: No Food Insecurity (07/16/2023)   [facility] from [facility]  Housing: Low Risk (07/16/2023)   Received from [facility]  Transportation Needs: No Transportation Needs (07/16/2023)   Received from [facility]  Utilities: Not At Risk (07/16/2023)   Received from [facility]  Alcohol Screen: Low Risk (05/14/2023)  Financial Resource Strain: Low Risk (07/16/2023)   Received from [facility]  Tobacco Use: High Risk (01/26/2024)    Readmission Risk Interventions    03/26/2022   10:50 AM  Readmission Risk Prevention Plan  Transportation Screening Complete  HRI or Home Care Consult Complete  Social Work Consult for Recovery Care Planning/Counseling Complete  Palliative Care Screening Not Applicable  Medication Review Oceanographer) Referral to Pharmacy

## 2024-03-24 NOTE — Progress Notes (Signed)
 Physical Therapy Treatment Patient Details Name: Amanda [name] MRN: [id number] DOB: July 11, 1960 Today's Date: 03/24/2024   History of Present Illness 63 yo female presents to [facility] on 9/22 with lethargy and near-syncope s/p cocaine and opioid ingestion. 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 S/p R frontal ventriculostomy, burr holes 9/22. ETT 9/22-9/23. S/p EVD removal 10/3. PEG tube removed 12/2 following improved p.o. intake. PMHx of cocaine abuse, CHF, CKD, chronic cough, HTN and DM2.    PT Comments  Pt tolerated treatment well today. Pt with similar presentation to previous session. Remains very impulsive however able to ambulate hallway and navigate steps with CGA/Min A. No change in DC/DME recs at this time. PT will continue to follow.     If plan is discharge home, recommend the following: Supervision due to cognitive status;Assist for transportation;Help with stairs or ramp for entrance;Assistance with cooking/housework;Direct supervision/assist for medications management;Direct supervision/assist for financial management;A lot of help with walking and/or transfers;A lot of help with bathing/dressing/bathroom   Can travel by private vehicle     No  Equipment Recommendations  Rolling walker (2 wheels);BSC/3in1    Recommendations for Other Services       Precautions / Restrictions Precautions Precautions: Fall Recall of Precautions/Restrictions: Impaired Precaution/Restrictions Comments: PEG, SBP < 160, impulsive Restrictions Weight Bearing Restrictions Per Provider Order: No     Mobility  Bed Mobility Overal bed mobility: Needs Assistance Bed Mobility: Supine to Sit     Supine to sit: Contact guard, HOB elevated, Used rails     General bed mobility comments: CGA for safety due to impulsivity    Transfers Overall transfer level: Needs assistance Equipment used: Rolling  walker (2 wheels) Transfers: Sit to/from Stand Sit to Stand: Contact guard assist           General transfer comment: Stood from bed with VC for hand placement.    Ambulation/Gait Ambulation/Gait assistance: Contact guard assist, Min assist Gait Distance (Feet): 600 Feet Assistive device: Rolling walker (2 wheels) Gait Pattern/deviations: Step-through pattern, Decreased stride length, Trunk flexed, Staggering right Gait velocity: Dec     General Gait Details: Pt opted for RW today. Cues for proximity to RW however not adhering to commands from this PT.   Stairs Stairs: Yes Stairs assistance: Contact guard assist, Min assist Stair Management: Two rails, Forwards Number of Stairs: 2 General stair comments: min A for balance, especially during descent   Wheelchair Mobility     Tilt Bed    Modified Rankin (Stroke Patients Only)       Balance Overall balance assessment: Needs assistance Sitting-balance support: No upper extremity supported, Feet supported Sitting balance-Leahy Scale: Fair Sitting balance - Comments: seated EOB, no overt LOB   Standing balance support: Reliant on assistive device for balance, During functional activity, Bilateral upper extremity supported Standing balance-Leahy Scale: Poor Standing balance comment: reliant on RW, although dynamic balance is improving                            Communication Communication Communication: No apparent difficulties Factors Affecting Communication: Reduced clarity of speech (hypophonic at times)  Cognition Arousal: Alert Behavior During Therapy: Impulsive                             Following commands: Impaired Following commands impaired: Follows one step commands with increased time    Cueing  Cueing Techniques: Verbal cues, Gestural cues  Exercises Other Exercises Other Exercises: Way finding in hallway, pt able to successfully navigate herself back to room (suspect due to  familiarity of 3W floor, not because she was able to denote how to read/utilize signage in hallway). Anticipate incr difficulty with wayfinding in unfamiliar area.    General Comments General comments (skin integrity, edema, etc.): VSS      Pertinent Vitals/Pain Pain Assessment Pain Assessment: Faces Faces Pain Scale: No hurt    Home Living                          Prior Function            PT Goals (current goals can now be found in the care plan section) Progress towards PT goals: Progressing toward goals    Frequency    Min 2X/week      PT Plan      Co-evaluation              AM-PAC PT 6 Clicks Mobility   Outcome Measure  Help needed turning from your back to your side while in a flat bed without using bedrails?: A Little Help needed moving from lying on your back to sitting on the side of a flat bed without using bedrails?: A Little Help needed moving to and from a bed to a chair (including a wheelchair)?: A Little Help needed standing up from a chair using your arms (e.g., wheelchair or bedside chair)?: A Little Help needed to walk in hospital room?: A Little Help needed climbing 3-5 steps with a railing? : A Little 6 Click Score: 18    End of Session Equipment Utilized During Treatment: Gait belt Activity Tolerance: Patient tolerated treatment well Patient left: in chair;with chair alarm set;with call bell/phone within reach Nurse Communication: Mobility status;Other (comment) PT Visit Diagnosis: Unsteadiness on feet (R26.81);Difficulty in walking, not elsewhere classified (R26.2);Other abnormalities of gait and mobility (R26.89);Other symptoms and signs involving the nervous system (R29.898);Muscle weakness (generalized) (M62.81)     Time: 8841-8776 PT Time Calculation (min) (ACUTE ONLY): 25 min  Charges:    $Gait Training: 23-37 mins PT General Charges $$ ACUTE PT VISIT: 1 Visit                     Sueellen NOVAK, PT, DPT Acute Rehab  Services [phone]    Imogen Maddalena 03/24/2024, 3:41 PM

## 2024-03-25 DIAGNOSIS — I610 Nontraumatic intracerebral hemorrhage in hemisphere, subcortical: Secondary | ICD-10-CM | POA: Diagnosis not present

## 2024-03-25 LAB — CBC WITH DIFFERENTIAL/PLATELET
Abs Immature Granulocytes: 0.02 K/uL (ref 0.00–0.07)
Basophils Absolute: 0 K/uL (ref 0.0–0.1)
Basophils Relative: 0 %
Eosinophils Absolute: 0.3 K/uL (ref 0.0–0.5)
Eosinophils Relative: 4 %
HCT: 36.2 % (ref 36.0–46.0)
Hemoglobin: 11.9 g/dL — ABNORMAL LOW (ref 12.0–15.0)
Immature Granulocytes: 0 %
Lymphocytes Relative: 31 %
Lymphs Abs: 1.9 K/uL (ref 0.7–4.0)
MCH: 29 pg (ref 26.0–34.0)
MCHC: 32.9 g/dL (ref 30.0–36.0)
MCV: 88.3 fL (ref 80.0–100.0)
Monocytes Absolute: 0.5 K/uL (ref 0.1–1.0)
Monocytes Relative: 9 %
Neutro Abs: 3.4 K/uL (ref 1.7–7.7)
Neutrophils Relative %: 56 %
Platelets: 279 K/uL (ref 150–400)
RBC: 4.1 MIL/uL (ref 3.87–5.11)
RDW: 15.6 % — ABNORMAL HIGH (ref 11.5–15.5)
WBC: 6.1 K/uL (ref 4.0–10.5)
nRBC: 0 % (ref 0.0–0.2)

## 2024-03-25 LAB — BASIC METABOLIC PANEL WITH GFR
Anion gap: 10 (ref 5–15)
BUN: 13 mg/dL (ref 8–23)
CO2: 25 mmol/L (ref 22–32)
Calcium: 9.3 mg/dL (ref 8.9–10.3)
Chloride: 105 mmol/L (ref 98–111)
Creatinine, Ser: 0.81 mg/dL (ref 0.44–1.00)
GFR, Estimated: >60 mL/min (ref >60)
Glucose, Bld: 131 mg/dL — ABNORMAL HIGH (ref 70–99)
Potassium: 3.6 mmol/L (ref 3.5–5.1)
Sodium: 140 mmol/L (ref 135–145)

## 2024-03-25 NOTE — Plan of Care (Signed)
°  Problem: Health Behavior/Discharge Planning: Goal: Ability to manage health-related needs will improve Outcome: Progressing   Problem: Nutritional: Goal: Maintenance of adequate nutrition will improve Outcome: Progressing   Problem: Health Behavior/Discharge Planning: Goal: Ability to manage health-related needs will improve Outcome: Progressing   Problem: Clinical Measurements: Goal: Diagnostic test results will improve Outcome: Progressing   Problem: Activity: Goal: Risk for activity intolerance will decrease Outcome: Progressing   Problem: Coping: Goal: Level of anxiety will decrease Outcome: Progressing   Problem: Elimination: Goal: Will not experience complications related to urinary retention Outcome: Progressing   Problem: Skin Integrity: Goal: Risk for impaired skin integrity will decrease Outcome: Progressing   Problem: Intracerebral Hemorrhage Tissue Perfusion: Goal: Complications of Intracerebral Hemorrhage will be minimized Outcome: Progressing

## 2024-03-25 NOTE — Progress Notes (Signed)
 PROGRESS NOTE  Amanda [name]  DOB: [birth year]  PCP: Patient, No Pcp Per FMW:[id number]  DOA: 01/03/2024  LOS: 82 days  Hospital Day: 83  Subjective: Patient was seen and examined this morning. Lying on bed.  Not in distress.  No new symptoms. Afebrile, hemodynamically stable Mental status stable.  No sitter since 12/8.  Pending placement   Brief narrative: Amanda [name] is a 63 y.o. female with PMH significant for DM2, HTN, CHF, CKD, cocaine use, 9/22, patient presented with lethargy, near syncope. Found to be severely hypertensive UDS positive for cocaine CT head showed no acute right ganglial thalamic IPH with intraventricular extension with associated early hydrocephalus. Patient was admitted to neuro ICU for close blood pressure management.  Neurology and neurosurgery were consulted.  Patient underwent right frontal ventriculostomy. EVD eventually removed on 10/3, scalp staples removed 02/01/2024.          Hospital course remarkable for dysphagia, underwent PEG tube placement by IR on 10/16.  Over the course of next several weeks, oral intake gradually improved.  PEG tube removed on 12/2 PT recommended SNF Difficult to place On and off agitated and did require restraints.   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 EVD eventually removed on 10/3,  Has residual left hemiparesis Seen by PT.  SNF recommended. Awaiting placement  Dysphagia Moderate malnutrition underwent PEG tube placement by IR on 10/16.  Over the course of next several weeks, oral intake gradually improved.  PEG tube removed on 12/2 Currently on dysphagia 3 diet  Tolerating diet without difficulty Continue feeding supplement Bowel regimen with MiraLAX  as needed  Acute metabolic encephalopathy   impaired mobility  Fall Patient has impaired cognition and impaired mobility poststroke.  She cannot walk on her own.  But she is impulsive and unaware of her deficits.  Had fall. Currently mental status stable. Currently on Seroquel  25 mg nightly, IV olanzapine  5 mg every 8 hours PRN   Chronic combined systolic and diastolic CHF  Hypertension CHF compensated, blood pressure stable  Currently on Coreg  25 mg twice daily, Entresto  97-103 mg twice daily, Aldactone  25 mg daily, IV and oral hydralazine  as needed  Type 2 diabetes mellitus A1c 6.6 in 01/01/2024 Currently on SSI/Accu-Cheks Recent Labs  Lab 03/19/24 1209 03/24/24 2021  GLUCAP 132* 193*   Cocaine abuse  Consulted TOC for cocaine cessation resources.    Nutrition Status: Nutrition Problem: Moderate Malnutrition Etiology: social / environmental circumstances (polysubstance abuse) Signs/Symptoms: mild muscle depletion, moderate fat depletion, mild fat depletion, moderate muscle depletion Interventions: Ensure Enlive (each supplement provides 350kcal and 20 grams of protein), MVI   Goals of care   Code Status: Full Code     DVT prophylaxis:  enoxaparin  (LOVENOX ) injection 40 mg Start: 02/21/24 1430 Place and maintain sequential compression device Start: 01/03/24 0611 SCDs Start: 01/03/24 0429   Antimicrobials: None Fluid: None Consultants: None Family Communication: None at bedside  Status: Inpatient Level of care:  Med-Surg   Patient is from: Home Needs to continue in-hospital care: Pending SNF approval.  Per case worker note, no bed offer    Diet:  Diet Order             DIET DYS 3 Room service appropriate? Yes with Assist; Fluid consistency: Thin  Diet effective now  Scheduled Meds:  carvedilol   25 mg Oral BID WC   enoxaparin  (LOVENOX ) injection  40 mg Subcutaneous Daily   feeding supplement  237 mL Oral TID BM   free water   50 mL Per Tube Daily   mineral oil-hydrophilic  petrolatum    Topical BID   QUEtiapine   25 mg Oral QAC supper   sacubitril -valsartan   1 tablet Oral BID   spironolactone   25 mg Oral Daily    PRN meds: acetaminophen  **OR** acetaminophen  (TYLENOL ) oral liquid 160 mg/5 mL **OR** acetaminophen , benzonatate , hydrALAZINE , hydrALAZINE , ipratropium-albuterol , labetalol , menthol , OLANZapine , mouth rinse, polyethylene glycol, white petrolatum    Infusions:    Antimicrobials: Anti-infectives (From admission, onward)    Start     Dose/Rate Route Frequency Ordered Stop   01/27/24 0807  ceFAZolin  (ANCEF ) IVPB 2g/100 mL premix        over 30 Minutes Intravenous Continuous PRN 01/27/24 0807 01/27/24 0807   01/13/24 0600  ceFAZolin  (ANCEF ) IVPB 2g/100 mL premix        2 g 200 mL/hr over 30 Minutes Intravenous On call to O.R. 01/11/24 2300 01/13/24 0557       Objective: Vitals:   03/25/24 0432 03/25/24 0849  BP: (!) 141/88 (!) 115/98  Pulse: 74 86  Resp: 18 18  Temp: 97.9 F (36.6 C) 98.5 F (36.9 C)  SpO2: 100% 99%    Intake/Output Summary (Last 24 hours) at 03/25/2024 1207 Last data filed at 03/24/2024 2200 Gross per 24 hour  Intake 180 ml  Output --  Net 180 ml   Filed Weights   03/16/24 0500 03/18/24 0704 03/25/24 0500  Weight: 51.9 kg 52.8 kg 53.2 kg   Weight change:  Body mass index is 19.52 kg/m.   Physical Exam: General exam: Pleasant, middle-aged African-American female.  Not in distress Skin: No rashes, lesions or ulcers. HEENT: Atraumatic, normocephalic, no obvious bleeding Lungs: Clear to auscultation bilaterally,  CVS: S1, S2, no murmur,   GI/Abd: Soft, nontender, nondistended, bowel sound present,   CNS: Alert, awake, slow to respond.  Impulsive but able to ambulate. Psychiatry: Mood appropriate Extremities: No pedal edema, no calf tenderness,   Data Review: I have personally reviewed the laboratory data and studies available.  F/u labs ordered Unresulted Labs (From admission, onward)    None        Signed, Chapman Rota, MD [facility] 03/25/2024

## 2024-03-26 NOTE — Progress Notes (Signed)
 PROGRESS NOTE  Amanda [name]  DOB: [birth year]  PCP: Patient, No Pcp Per FMW:[id number]  DOA: 01/03/2024  LOS: 83 days  Hospital Day: 84  Subjective: Patient was seen and examined this morning. Lying down on bed.   Not in distress. Cheerful. Afebrile, hemodynamically stable with blood pressure mostly 140s Mental status stable.   No sitter since 12/8.  Pending placement  Brief narrative: Amanda [name] is a 64 y.o. female with PMH significant for DM2, HTN, CHF, CKD, cocaine use, 9/22, patient presented with lethargy, near syncope. Found to be severely hypertensive UDS was positive for cocaine CT head showed no acute right ganglial thalamic IPH with intraventricular extension with associated early hydrocephalus. Patient was admitted to neuro ICU for close blood pressure management.  Neurology and neurosurgery were consulted.  Patient underwent right frontal ventriculostomy. EVD eventually removed on 10/3, scalp staples removed 02/01/2024.          Hospital course remarkable for dysphagia, underwent PEG tube placement by IR on 10/16.  Over the course of next several weeks, oral intake gradually improved.  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 EVD eventually removed on 10/3,  Has residual left hemiparesis Seen by PT.  SNF recommended. Awaiting placement  Dysphagia Moderate malnutrition underwent PEG tube placement by IR on 10/16.  Over the course of next several weeks, oral intake gradually improved.  PEG tube removed on 12/2 Currently on dysphagia 3 diet  Tolerating diet without difficulty Continue feeding supplement Bowel regimen with MiraLAX  as needed  Acute metabolic encephalopathy  impaired mobility   Fall Patient has impaired cognition and impaired mobility poststroke.  Patient is impulsive and unaware of her deficits.  Had fall in the hospital Mental status has gradually improved.  Off sitter since 12/8. Currently on Seroquel  25 mg nightly, IV olanzapine  5 mg every 8 hours PRN   Chronic combined systolic and diastolic CHF  Hypertension CHF compensated, blood pressure stable  Currently on Coreg  25 mg twice daily, Entresto  97-103 mg twice daily, Aldactone  25 mg daily, IV and oral hydralazine  as needed  Type 2 diabetes mellitus A1c 6.6 in 01/01/2024 Currently on SSI/Accu-Cheks Recent Labs  Lab 03/24/24 2021  GLUCAP 193*   Cocaine abuse  Consulted TOC for cocaine cessation resources.    Nutrition Status: Nutrition Problem: Moderate Malnutrition Etiology: social / environmental circumstances (polysubstance abuse) Signs/Symptoms: mild muscle depletion, moderate fat depletion, mild fat depletion, moderate muscle depletion Interventions: Ensure Enlive (each supplement provides 350kcal and 20 grams of protein), MVI   Goals of care   Code Status: Full Code     DVT prophylaxis:  enoxaparin  (LOVENOX ) injection 40 mg Start: 02/21/24 1430 Place and maintain sequential compression device Start: 01/03/24 0611 SCDs Start: 01/03/24 0429   Antimicrobials: None Fluid: None Consultants: None Family Communication: None at bedside  Status: Inpatient Level of care:  Med-Surg   Patient is from: Home Needs to continue in-hospital care: Pending SNF approval.  Per case worker note, no bed offer    Diet:  Diet Order             DIET DYS 3 Room service appropriate? Yes with Assist; Fluid consistency: Thin  Diet effective now  Scheduled Meds:  carvedilol   25 mg Oral BID WC   enoxaparin  (LOVENOX ) injection  40 mg Subcutaneous Daily   feeding supplement  237 mL Oral TID BM   mineral oil-hydrophilic petrolatum    Topical BID   QUEtiapine   25 mg Oral QAC supper    sacubitril -valsartan   1 tablet Oral BID   spironolactone   25 mg Oral Daily    PRN meds: acetaminophen  **OR** acetaminophen  (TYLENOL ) oral liquid 160 mg/5 mL **OR** acetaminophen , benzonatate , hydrALAZINE , hydrALAZINE , ipratropium-albuterol , labetalol , menthol , OLANZapine , mouth rinse, polyethylene glycol, white petrolatum    Infusions:    Antimicrobials: Anti-infectives (From admission, onward)    Start     Dose/Rate Route Frequency Ordered Stop   01/27/24 0807  ceFAZolin  (ANCEF ) IVPB 2g/100 mL premix        over 30 Minutes Intravenous Continuous PRN 01/27/24 0807 01/27/24 0807   01/13/24 0600  ceFAZolin  (ANCEF ) IVPB 2g/100 mL premix        2 g 200 mL/hr over 30 Minutes Intravenous On call to O.R. 01/11/24 2300 01/13/24 0557       Objective: Vitals:   03/26/24 0414 03/26/24 0828  BP: (!) 155/99 128/84  Pulse: 72 91  Resp: 18 20  Temp: 98 F (36.7 C) 97.9 F (36.6 C)  SpO2: 97% 99%    Intake/Output Summary (Last 24 hours) at 03/26/2024 1427 Last data filed at 03/25/2024 2000 Gross per 24 hour  Intake 60 ml  Output --  Net 60 ml   Filed Weights   03/18/24 0704 03/25/24 0500 03/26/24 0500  Weight: 52.8 kg 53.2 kg 55.2 kg   Weight change: 2 kg Body mass index is 20.25 kg/m.   Physical Exam: General exam: Pleasant, middle-aged African-American female.  Not in distress Skin: No rashes, lesions or ulcers. HEENT: Atraumatic, normocephalic, no obvious bleeding Lungs: Clear to auscultation bilaterally,  CVS: S1, S2, no murmur,   GI/Abd: Soft, nontender, nondistended, bowel sound present,   CNS: Alert, awake, slow to respond.  Impulsive but able to ambulate. Psychiatry: Mood appropriate Extremities: No pedal edema, no calf tenderness,   Data Review: I have personally reviewed the laboratory data and studies available.  F/u labs ordered Unresulted Labs (From admission, onward)    None       Signed, Chapman Rota, MD [facility] 03/26/2024

## 2024-03-26 NOTE — Plan of Care (Signed)
°  Problem: Health Behavior/Discharge Planning: Goal: Ability to manage health-related needs will improve Outcome: Progressing   Problem: Nutritional: Goal: Maintenance of adequate nutrition will improve Outcome: Progressing   Problem: Skin Integrity: Goal: Risk for impaired skin integrity will decrease Outcome: Progressing   Problem: Clinical Measurements: Goal: Will remain free from infection Outcome: Progressing   Problem: Activity: Goal: Risk for activity intolerance will decrease Outcome: Progressing   Problem: Nutrition: Goal: Adequate nutrition will be maintained Outcome: Progressing   Problem: Coping: Goal: Level of anxiety will decrease Outcome: Progressing   Problem: Safety: Goal: Ability to remain free from injury will improve Outcome: Progressing   Problem: Intracerebral Hemorrhage Tissue Perfusion: Goal: Complications of Intracerebral Hemorrhage will be minimized Outcome: Progressing

## 2024-03-26 NOTE — Plan of Care (Signed)
°  Problem: Fluid Volume: Goal: Ability to maintain a balanced intake and output will improve Outcome: Progressing   Problem: Metabolic: Goal: Ability to maintain appropriate glucose levels will improve Outcome: Progressing   Problem: Nutritional: Goal: Maintenance of adequate nutrition will improve Outcome: Progressing Goal: Progress toward achieving an optimal weight will improve Outcome: Progressing   Problem: Skin Integrity: Goal: Risk for impaired skin integrity will decrease Outcome: Progressing   Problem: Tissue Perfusion: Goal: Adequacy of tissue perfusion will improve Outcome: Progressing   Problem: Clinical Measurements: Goal: Ability to maintain clinical measurements within normal limits will improve Outcome: Progressing Goal: Will remain free from infection Outcome: Progressing Goal: Diagnostic test results will improve Outcome: Progressing Goal: Respiratory complications will improve Outcome: Progressing   Problem: Activity: Goal: Risk for activity intolerance will decrease Outcome: Progressing   Problem: Nutrition: Goal: Adequate nutrition will be maintained Outcome: Progressing   Problem: Elimination: Goal: Will not experience complications related to bowel motility Outcome: Progressing Goal: Will not experience complications related to urinary retention Outcome: Progressing   Problem: Pain Managment: Goal: General experience of comfort will improve and/or be controlled Outcome: Progressing   Problem: Safety: Goal: Ability to remain free from injury will improve Outcome: Progressing   Problem: Skin Integrity: Goal: Risk for impaired skin integrity will decrease Outcome: Progressing   Problem: Health Behavior/Discharge Planning: Goal: Goals will be collaboratively established with patient/family Outcome: Progressing   Problem: Self-Care: Goal: Ability to participate in self-care as condition permits will improve Outcome: Progressing Goal:  Verbalization of feelings and concerns over difficulty with self-care will improve Outcome: Progressing   Problem: Nutrition: Goal: Risk of aspiration will decrease Outcome: Progressing Goal: Dietary intake will improve Outcome: Progressing

## 2024-03-26 NOTE — Plan of Care (Signed)
  Problem: Education: Goal: Ability to describe self-care measures that may prevent or decrease complications (Diabetes Survival Skills Education) will improve Outcome: Progressing   Problem: Coping: Goal: Ability to adjust to condition or change in health will improve Outcome: Progressing   Problem: Fluid Volume: Goal: Ability to maintain a balanced intake and output will improve Outcome: Progressing   Problem: Health Behavior/Discharge Planning: Goal: Ability to identify and utilize available resources and services will improve Outcome: Progressing Goal: Ability to manage health-related needs will improve Outcome: Progressing   Problem: Metabolic: Goal: Ability to maintain appropriate glucose levels will improve Outcome: Progressing   Problem: Nutritional: Goal: Maintenance of adequate nutrition will improve Outcome: Progressing Goal: Progress toward achieving an optimal weight will improve Outcome: Progressing   Problem: Skin Integrity: Goal: Risk for impaired skin integrity will decrease Outcome: Progressing   Problem: Tissue Perfusion: Goal: Adequacy of tissue perfusion will improve Outcome: Progressing   Problem: Education: Goal: Knowledge of General Education information will improve Description: Including pain rating scale, medication(s)/side effects and non-pharmacologic comfort measures Outcome: Progressing   Problem: Health Behavior/Discharge Planning: Goal: Ability to manage health-related needs will improve Outcome: Progressing   Problem: Clinical Measurements: Goal: Ability to maintain clinical measurements within normal limits will improve Outcome: Progressing Goal: Will remain free from infection Outcome: Progressing Goal: Diagnostic test results will improve Outcome: Progressing Goal: Respiratory complications will improve Outcome: Progressing Goal: Cardiovascular complication will be avoided Outcome: Progressing   Problem: Activity: Goal:  Risk for activity intolerance will decrease Outcome: Progressing   Problem: Nutrition: Goal: Adequate nutrition will be maintained Outcome: Progressing   Problem: Coping: Goal: Level of anxiety will decrease Outcome: Progressing   Problem: Elimination: Goal: Will not experience complications related to bowel motility Outcome: Progressing Goal: Will not experience complications related to urinary retention Outcome: Progressing   Problem: Pain Managment: Goal: General experience of comfort will improve and/or be controlled Outcome: Progressing   Problem: Safety: Goal: Ability to remain free from injury will improve Outcome: Progressing   Problem: Skin Integrity: Goal: Risk for impaired skin integrity will decrease Outcome: Progressing   Problem: Education: Goal: Knowledge of disease or condition will improve Outcome: Progressing Goal: Knowledge of secondary prevention will improve (MUST DOCUMENT ALL) Outcome: Progressing Goal: Knowledge of patient specific risk factors will improve (DELETE if not current risk factor) Outcome: Progressing   Problem: Intracerebral Hemorrhage Tissue Perfusion: Goal: Complications of Intracerebral Hemorrhage will be minimized Outcome: Progressing   Problem: Coping: Goal: Will verbalize positive feelings about self Outcome: Progressing Goal: Will identify appropriate support needs Outcome: Progressing   Problem: Health Behavior/Discharge Planning: Goal: Ability to manage health-related needs will improve Outcome: Progressing Goal: Goals will be collaboratively established with patient/family Outcome: Progressing   Problem: Self-Care: Goal: Ability to participate in self-care as condition permits will improve Outcome: Progressing Goal: Verbalization of feelings and concerns over difficulty with self-care will improve Outcome: Progressing Goal: Ability to communicate needs accurately will improve Outcome: Progressing   Problem:  Nutrition: Goal: Risk of aspiration will decrease Outcome: Progressing Goal: Dietary intake will improve Outcome: Progressing

## 2024-03-27 NOTE — Progress Notes (Signed)
 PROGRESS NOTE  Amanda [name]  DOB: 1960-10-30  PCP: Patient, No Pcp Per FMW:[id number]  DOA: 01/03/2024  LOS: 84 days  Hospital Day: 82  Brief narrative: Amanda [name] is a 63 y.o. female with PMH significant for DM2, HTN, CHF, CKD, cocaine use, 9/22, patient presented with lethargy, near syncope. Found to be severely hypertensive UDS was positive for cocaine CT head showed no acute right ganglial thalamic IPH with intraventricular extension with associated early hydrocephalus. Patient was admitted to neuro ICU for close blood pressure management.  Neurology and neurosurgery were consulted.  Patient underwent right frontal ventriculostomy. EVD eventually removed on 10/3, scalp staples removed 02/01/2024.          Hospital course remarkable for dysphagia, underwent PEG tube placement by IR on 10/16.  Over the course of next several weeks, oral intake gradually improved.  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 EVD eventually removed on 10/3,  Has residual left hemiparesis Seen by PT.  SNF recommended. Awaiting placement  Dysphagia Moderate malnutrition underwent PEG tube placement by IR on 10/16.  Over the course of next several weeks, oral intake gradually improved.  PEG tube removed on 12/2 Currently on dysphagia 3 diet  Tolerating diet without difficulty Continue feeding supplement Bowel regimen with MiraLAX  as needed  Acute metabolic encephalopathy  impaired mobility  Fall Patient has impaired cognition and impaired mobility poststroke.  Patient is impulsive and unaware of her deficits.  Had fall in the hospital Mental status has gradually improved.  Off sitter since 12/8. Currently on Seroquel  25 mg  nightly, IV olanzapine  5 mg every 8 hours PRN   Chronic combined systolic and diastolic CHF  Hypertension CHF compensated, blood pressure stable  Currently on Coreg  25 mg twice daily, Entresto  97-103 mg twice daily, Aldactone  25 mg daily, IV and oral hydralazine  as needed  Type 2 diabetes mellitus A1c 6.6 in 01/01/2024 Currently on SSI/Accu-Cheks Recent Labs  Lab 03/24/24 2021  GLUCAP 193*   Cocaine abuse  Consulted TOC for cocaine cessation resources.    Nutrition Status: Nutrition Problem: Moderate Malnutrition Etiology: social / environmental circumstances (polysubstance abuse) Signs/Symptoms: mild muscle depletion, moderate fat depletion, mild fat depletion, moderate muscle depletion Interventions: Ensure Enlive (each supplement provides 350kcal and 20 grams of protein), MVI   Goals of care   Code Status: Full Code     DVT prophylaxis:  enoxaparin  (LOVENOX ) injection 40 mg Start: 02/21/24 1430 Place and maintain sequential compression device Start: 01/03/24 0611 SCDs Start: 01/03/24 0429   Antimicrobials: None Fluid: None Consultants: None Family Communication: None at bedside  Status: Inpatient Level of care:  Med-Surg   Patient is from: Home Needs to continue in-hospital care: Pending SNF approval.  Per case worker note, no bed offer    Diet:  Diet Order             DIET DYS 3 Room service appropriate? Yes with Assist; Fluid consistency: Thin  Diet effective now                   Scheduled Meds:  carvedilol   25 mg Oral BID WC   enoxaparin  (LOVENOX ) injection  40 mg Subcutaneous Daily   feeding supplement  237 mL Oral TID BM   mineral oil-hydrophilic petrolatum   Topical BID   QUEtiapine   25 mg Oral QAC supper   sacubitril -valsartan   1 tablet Oral BID   spironolactone   25 mg Oral Daily    PRN meds: acetaminophen  **OR** acetaminophen  (TYLENOL ) oral liquid 160 mg/5 mL **OR** acetaminophen , benzonatate , hydrALAZINE , hydrALAZINE ,  ipratropium-albuterol , labetalol , menthol , OLANZapine , mouth rinse, polyethylene glycol, white petrolatum    Infusions:    Antimicrobials: Anti-infectives (From admission, onward)    Start     Dose/Rate Route Frequency Ordered Stop   01/27/24 0807  ceFAZolin  (ANCEF ) IVPB 2g/100 mL premix        over 30 Minutes Intravenous Continuous PRN 01/27/24 0807 01/27/24 0807   01/13/24 0600  ceFAZolin  (ANCEF ) IVPB 2g/100 mL premix        2 g 200 mL/hr over 30 Minutes Intravenous On call to O.R. 01/11/24 2300 01/13/24 0557     Subjective: Patient was seen and examined this morning. She has no acute complaints this morning, denies any shortness of breath, chest pain, nausea, vomiting.  He awaits placement in a skilled nursing facility.  Objective: Vitals:   03/26/24 2111 03/27/24 0907  BP: 136/68 123/76  Pulse: 77 82  Resp: 18 20  Temp: 98 F (36.7 C) 98.5 F (36.9 C)  SpO2: 97% 100%   No intake or output data in the 24 hours ending 03/27/24 1254  Filed Weights   03/18/24 0704 03/25/24 0500 03/26/24 0500  Weight: 52.8 kg 53.2 kg 55.2 kg   Weight change:  Body mass index is 20.25 kg/m.   Physical Exam: General  No acute Distress Eyes: PERRL, lids and conjunctivae normal ENMT: Mucous membranes are moist.   Neck: normal, supple, no masses, no thyromegaly Respiratory: clear to auscultation bilaterally, no wheezing, no crackles. Normal respiratory effort. No accessory muscle use.  Cardiovascular: Regular rate and rhythm, no murmurs / rubs / gallops Abdomen: Soft, nontender nondistended. Musculoskeletal: no clubbing / cyanosis. No joint deformity upper and lower extremities.  Skin: no rashes, lesions, ulcers. No induration Neurologic: Facial asymmetry, moving extremity spontaneously, speech fluent. Psychiatric: Normal judgment and insight. Alert and oriented x 3. Normal mood.    Data Review: I have personally reviewed the laboratory data and studies available.  F/u labs  ordered Unresulted Labs (From admission, onward)    None       Signed, Landon FORBES Baller, MD [facility] 03/27/2024

## 2024-03-27 NOTE — Progress Notes (Signed)
 Nutrition Follow-up  DOCUMENTATION CODES:  Non-severe (moderate) malnutrition in context of social or environmental circumstances (polysubstance abuse)  INTERVENTION:  Continue current diet as ordered per SLP Encourage PO intake  Changed to ordering assistance Ensure Plus High Protein po TID, each supplement provides 350 kcal and 20 grams of protein MVI with minerals daily   NUTRITION DIAGNOSIS:  Moderate Malnutrition related to social / environmental circumstances (polysubstance abuse) as evidenced by mild muscle depletion, moderate fat depletion, mild fat depletion, moderate muscle depletion. - remains applicable  GOAL:  Patient will meet greater than or equal to 90% of their needs - progressing   MONITOR:  PO intake, Supplement acceptance, Labs, Skin  REASON FOR ASSESSMENT:  Consult Enteral/tube feeding initiation and management  ASSESSMENT:  Pt with hx HTN, CHF, type 2 diabetes, polysubstance abuse, and pulmonary edema. Admitted with AMS, lethargy, and deteriorating condition requiring intubation; diagnosed ICH.  9/22 - admitted, intubated, EVD placed 9/23 - extubated 9/24 - s/p cortrak placement; tip gastric  9/25 - Diet advanced to Dysphagia 2/thin; TF stopped 9/26 - back down to Full liquids, TF resumed  10/13- upgraded to DYS 3, but SLP recommends PEG 10/16 - PEG placed in IR 11/7 - pt meeting nutrition needs s/p kcal count, TF discontinued 12/2 - PEG removed  Pt resting in bed at the time of assessment on the phone ordering her lunch. Pt continues to have good intake of meals and supplements. PEG was removed on 12/2.  Medically stable to discharge when placement is secured, RD will sign-off at this time. Please submit a new RD consult if acute needs arise this admission.  Admit weight: 56.1 kg  Current weight: 51 kg    Average Meal Intake: 10/6-10/8: 0% intake x 4 recorded meals 10/6-10/12: 0% intake x 7 recorded meals 10/14-10/15: 14% x 4 recorded  meals 10/24-11/4: 57% intake x 6 recorded meals 11/6-11/11: 79% intake x 4 recorded meals 11/15-11/17: 44% intake x 7 recorded meals 11/27-12/1: 84% intake x 8 recorded meals  12/2-12/11: 81% intake x 7 recorded meals  Nutritionally Relevant Medications: Scheduled Meds:  Ensure Plus High Protein   237 mL Oral TID BM   spironolactone   25 mg Oral Daily   PRN Meds: polyethylene glycol  Labs Reviewed: HgbA1c 6.6%  Diet Order:   Diet Order             DIET DYS 3 Room service appropriate? Yes with Assist; Fluid consistency: Thin  Diet effective now                  EDUCATION NEEDS:  Not appropriate for education at this time  Skin: Reviewed RN documentation  Last BM:  12/14 - per pt report  Height:  Ht Readings from Last 1 Encounters:  01/03/24 5' 5 (1.651 m)    Weight:  Wt Readings from Last 1 Encounters:  03/26/24 55.2 kg    Ideal Body Weight:  56.8 kg  BMI:  Body mass index is 20.25 kg/m.  Estimated Nutritional Needs:  Kcal:  1500-1700 Protein:  80-90 grams Fluid:  1.5-1.7L   Vernell Lukes, RD, LDN, CNSC Registered Dietitian II Please reach out via secure chat

## 2024-03-27 NOTE — Progress Notes (Signed)
 Pt refused lovenox sq. MD notified.   Amado GORMAN Arabia, RN

## 2024-03-28 NOTE — Progress Notes (Signed)
 PROGRESS NOTE  Amanda [name]  DOB: 01-01-1961  PCP: Patient, No Pcp Per FMW:[id number]  DOA: 01/03/2024  LOS: 85 days  Hospital Day: 86  Subjective: Patient was seen and examined this morning. Lying on bed.  Not in distress Afebrile, hemodynamically stable Has been refusing Lovenox  every day.  I stopped Lovenox .  Also ordered for SCDs pending placement  Brief narrative: Amanda [name] is a 63 y.o. female with PMH significant for DM2, HTN, CHF, CKD, cocaine use, 9/22, patient presented with lethargy, near syncope. Found to be severely hypertensive UDS was positive for cocaine CT head showed no acute right ganglial thalamic IPH with intraventricular extension with associated early hydrocephalus. Patient was admitted to neuro ICU for close blood pressure management.  Neurology and neurosurgery were consulted.  Patient underwent right frontal ventriculostomy. EVD eventually removed on 10/3, scalp staples removed 02/01/2024.          Hospital course remarkable for dysphagia, underwent PEG tube placement by IR on 10/16.  Over the course of next several weeks, oral intake gradually improved.  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 EVD eventually removed on 10/3,  Has residual left hemiparesis Seen by PT.  SNF recommended. Awaiting placement  Dysphagia Moderate malnutrition underwent PEG tube placement by IR on 10/16.  Over the course of next several weeks, oral intake gradually improved.  PEG tube removed on 12/2 Currently on dysphagia 3 diet  Tolerating diet without difficulty Continue feeding supplement Bowel regimen with MiraLAX  as needed  Acute metabolic encephalopathy  impaired mobility  Fall Patient has  impaired cognition and impaired mobility poststroke.  Patient is impulsive and unaware of her deficits.  Had fall in the hospital Mental status has gradually improved.  Off sitter since 12/8. Currently on Seroquel  25 mg nightly, IV olanzapine  5 mg every 8 hours PRN   Chronic combined systolic and diastolic CHF  Hypertension CHF compensated, blood pressure stable  Currently on Coreg  25 mg twice daily, Entresto  97-103 mg twice daily, Aldactone  25 mg daily, IV and oral hydralazine  as needed  Type 2 diabetes mellitus A1c 6.6 in 01/01/2024 Currently on SSI/Accu-Cheks Recent Labs  Lab 03/24/24 2021  GLUCAP 193*   Cocaine abuse  Consulted TOC for cocaine cessation resources.    Nutrition Status: Nutrition Problem: Moderate Malnutrition Etiology: social / environmental circumstances (polysubstance abuse) Signs/Symptoms: mild muscle depletion, moderate fat depletion, mild fat depletion, moderate muscle depletion Interventions: Ensure Enlive (each supplement provides 350kcal and 20 grams of protein), MVI   Goals of care   Code Status: Full Code     DVT prophylaxis:  Place and maintain sequential compression device Start: 01/03/24 0611 SCDs Start: 01/03/24 0429   Antimicrobials: None Fluid: None Consultants: None Family Communication: None at bedside  Status: Inpatient Level of care:  Med-Surg   Patient is from: Home Needs to continue in-hospital care: Pending SNF approval.  Per case worker note, no bed offer    Diet:  Diet Order             DIET DYS 3 Room service appropriate? Yes with Assist; Fluid consistency: Thin  Diet effective now                   Scheduled Meds:  carvedilol   25 mg Oral  BID WC   feeding supplement  237 mL Oral TID BM   mineral oil-hydrophilic petrolatum    Topical BID   QUEtiapine   25 mg Oral QAC supper   sacubitril -valsartan   1 tablet Oral BID   spironolactone   25 mg Oral Daily    PRN meds: acetaminophen  **OR** acetaminophen   (TYLENOL ) oral liquid 160 mg/5 mL **OR** acetaminophen , benzonatate , hydrALAZINE , hydrALAZINE , ipratropium-albuterol , labetalol , menthol , OLANZapine , mouth rinse, polyethylene glycol, white petrolatum    Infusions:    Antimicrobials: Anti-infectives (From admission, onward)    Start     Dose/Rate Route Frequency Ordered Stop   01/27/24 0807  ceFAZolin  (ANCEF ) IVPB 2g/100 mL premix        over 30 Minutes Intravenous Continuous PRN 01/27/24 0807 01/27/24 0807   01/13/24 0600  ceFAZolin  (ANCEF ) IVPB 2g/100 mL premix        2 g 200 mL/hr over 30 Minutes Intravenous On call to O.R. 01/11/24 2300 01/13/24 0557       Objective: Vitals:   03/28/24 0425 03/28/24 0754  BP: 128/86 138/89  Pulse: 87 80  Resp: 18 19  Temp: 97.9 F (36.6 C) 97.6 F (36.4 C)  SpO2: 98% 97%    Intake/Output Summary (Last 24 hours) at 03/28/2024 1357 Last data filed at 03/27/2024 2200 Gross per 24 hour  Intake 120 ml  Output --  Net 120 ml   Filed Weights   03/25/24 0500 03/26/24 0500 03/28/24 0500  Weight: 53.2 kg 55.2 kg 54.6 kg   Weight change:  Body mass index is 20.03 kg/m.   Physical Exam: General exam: Pleasant, middle-aged African-American female.  Not in distress Skin: No rashes, lesions or ulcers. HEENT: Atraumatic, normocephalic, no obvious bleeding Lungs: Clear to auscultation bilaterally,  CVS: S1, S2, no murmur,   GI/Abd: Soft, nontender, nondistended, bowel sound present,   CNS: Alert, awake, slow to respond.  Impulsive but able to ambulate. Psychiatry: Mood appropriate Extremities: No pedal edema, no calf tenderness,   Data Review: I have personally reviewed the laboratory data and studies available.  F/u labs ordered Unresulted Labs (From admission, onward)    None       Signed, Chapman Rota, MD [facility] 03/28/2024

## 2024-03-28 NOTE — Progress Notes (Signed)
 Occupational Therapy Treatment Patient Details Name: Amanda [name] MRN: [id number] DOB: Aug 10, 1960 Today's Date: 03/28/2024   History of present illness 63 yo female presents to [facility] on 9/22 with lethargy and near-syncope s/p cocaine and opioid ingestion. 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 S/p R frontal ventriculostomy, burr holes 9/22. ETT 9/22-9/23. S/p EVD removal 10/3. PEG tube removed 12/2 following improved p.o. intake. PMHx of cocaine abuse, CHF, CKD, chronic cough, HTN and DM2.   OT comments  Patient continues to demonstrate good gains with OT treatment. Patient demonstrating posterior leaning when getting to EOB and while donning LB clothing but able to correct with verbal cues. Patient performed mobility with RW and CGA for safety. Patient able to perform grooming and UB bathing standing at sink with supervision and verbal cues for sequencing.  Functional mobility performed in hallway with min verbal cues for path finder.  Patient will benefit from continued inpatient follow up therapy, <3 hours/day. Acute OT to continue to follow to address established goals to facilitate DC to next venue of care.        If plan is discharge home, recommend the following:  Assistance with cooking/housework;Help with stairs or ramp for entrance;Direct supervision/assist for financial management;Direct supervision/assist for medications management;Assist for transportation;Supervision due to cognitive status;A little help with walking and/or transfers;A little help with bathing/dressing/bathroom   Equipment Recommendations  Other (comment) (defer)    Recommendations for Other Services      Precautions / Restrictions Precautions Precautions: Fall Recall of Precautions/Restrictions: Impaired Precaution/Restrictions Comments: PEG, SBP < 160, impulsive Restrictions Weight Bearing Restrictions Per  Provider Order: No       Mobility Bed Mobility Overal bed mobility: Needs Assistance Bed Mobility: Supine to Sit     Supine to sit: Contact guard, HOB elevated, Used rails     General bed mobility comments: patient demonstrating posterior leaning as she transitioned to EOB    Transfers Overall transfer level: Needs assistance Equipment used: Rolling walker (2 wheels) Transfers: Sit to/from Stand Sit to Stand: Contact guard assist           General transfer comment: Stood from bed with VC for hand placement.     Balance Overall balance assessment: Needs assistance Sitting-balance support: No upper extremity supported, Feet supported Sitting balance-Leahy Scale: Fair Sitting balance - Comments: posterior leaning while donning LB clothing Postural control: Posterior lean Standing balance support: Reliant on assistive device for balance, During functional activity, Bilateral upper extremity supported Standing balance-Leahy Scale: Poor Standing balance comment: reliant on external support when standing                           ADL either performed or assessed with clinical judgement   ADL Overall ADL's : Needs assistance/impaired     Grooming: Wash/dry hands;Wash/dry face;Applying deodorant;Supervision/safety;Standing Grooming Details (indicate cue type and reason): at sink Upper Body Bathing: Supervision/ safety;Cueing for sequencing;Standing Upper Body Bathing Details (indicate cue type and reason): at sink     Upper Body Dressing : Supervision/safety;Sitting Upper Body Dressing Details (indicate cue type and reason): gown for back Lower Body Dressing: Contact guard assist;Minimal assistance;Sit to/from stand Lower Body Dressing Details (indicate cue type and reason): required assistance to thread legs into pants and able to donn socks Toilet Transfer: Contact guard assist;Ambulation;Regular Toilet;Rolling walker (2 wheels) Toilet Transfer Details  (indicate cue type and reason): cues for safety Toileting- Clothing Manipulation and Hygiene:  Supervision/safety;Sitting/lateral lean         General ADL Comments: cueing for sequencing and safety    Extremity/Trunk Assessment              Vision       Perception     Praxis     Communication     Cognition Arousal: Alert Behavior During Therapy: Impulsive Cognition: Cognition impaired     Awareness: Intellectual awareness impaired Memory impairment (select all impairments): Short-term memory, Working memory Attention impairment (select first level of impairment): Sustained attention Executive functioning impairment (select all impairments): Initiation, Sequencing, Reasoning, Problem solving                            Cueing      Exercises      Shoulder Instructions       General Comments VSS on RA    Pertinent Vitals/ Pain       Pain Assessment Pain Assessment: No/denies pain  Home Living                                          Prior Functioning/Environment              Frequency  Min 2X/week        Progress Toward Goals  OT Goals(current goals can now be found in the care plan section)  Progress towards OT goals: Progressing toward goals     Plan      Co-evaluation                 AM-PAC OT 6 Clicks Daily Activity     Outcome Measure   Help from another person eating meals?: None Help from another person taking care of personal grooming?: A Little Help from another person toileting, which includes using toliet, bedpan, or urinal?: A Little Help from another person bathing (including washing, rinsing, drying)?: A Little Help from another person to put on and taking off regular upper body clothing?: A Little Help from another person to put on and taking off regular lower body clothing?: A Little 6 Click Score: 19    End of Session Equipment Utilized During Treatment: Gait belt;Rolling walker  (2 wheels)  OT Visit Diagnosis: Unsteadiness on feet (R26.81);Other abnormalities of gait and mobility (R26.89);Muscle weakness (generalized) (M62.81)   Activity Tolerance Patient tolerated treatment well   Patient Left in chair;with call bell/phone within reach;with chair alarm set   Nurse Communication Mobility status        Time: 9284-9259 OT Time Calculation (min): 25 min  Charges: OT General Charges $OT Visit: 1 Visit OT Treatments $Self Care/Home Management : 8-22 mins $Therapeutic Activity: 8-22 mins  Dick Laine, OTA Acute Rehabilitation Services  Office [phone]   Jeb LITTIE Laine 03/28/2024, 9:45 AM

## 2024-03-28 NOTE — Progress Notes (Signed)
 Pt refused lovenox  SQ. MD notified.   Amado GORMAN Arabia, RN

## 2024-03-28 NOTE — Plan of Care (Signed)
°  Problem: Health Behavior/Discharge Planning: Goal: Ability to manage health-related needs will improve 03/28/2024 0512 by Leobardo Zada Ronnald GORMAN, RN Outcome: Progressing 03/28/2024 0512 by Leobardo Zada Ronnald GORMAN, RN Outcome: Progressing   Problem: Skin Integrity: Goal: Risk for impaired skin integrity will decrease 03/28/2024 0512 by Leobardo Zada Ronnald GORMAN, RN Outcome: Progressing 03/28/2024 0512 by Leobardo Zada Ronnald GORMAN, RN Outcome: Progressing   Problem: Health Behavior/Discharge Planning: Goal: Ability to manage health-related needs will improve 03/28/2024 0512 by Leobardo Zada Ronnald GORMAN, RN Outcome: Progressing 03/28/2024 0512 by Leobardo Zada Ronnald GORMAN, RN Outcome: Progressing   Problem: Clinical Measurements: Goal: Will remain free from infection 03/28/2024 0512 by Leobardo Zada Ronnald GORMAN, RN Outcome: Progressing 03/28/2024 0512 by Leobardo Zada Ronnald GORMAN, RN Outcome: Progressing   Problem: Activity: Goal: Risk for activity intolerance will decrease 03/28/2024 0512 by Leobardo Zada Ronnald GORMAN, RN Outcome: Progressing 03/28/2024 0512 by Leobardo Zada Ronnald GORMAN, RN Outcome: Progressing   Problem: Coping: Goal: Level of anxiety will decrease 03/28/2024 0512 by Leobardo Zada Ronnald GORMAN, RN Outcome: Progressing 03/28/2024 0512 by Leobardo Zada Ronnald GORMAN, RN Outcome: Progressing   Problem: Intracerebral Hemorrhage Tissue Perfusion: Goal: Complications of Intracerebral Hemorrhage will be minimized 03/28/2024 0512 by Leobardo Zada Ronnald GORMAN, RN Outcome: Progressing 03/28/2024 0512 by Leobardo Zada Ronnald GORMAN, RN Outcome: Progressing

## 2024-03-29 DIAGNOSIS — I610 Nontraumatic intracerebral hemorrhage in hemisphere, subcortical: Secondary | ICD-10-CM | POA: Diagnosis not present

## 2024-03-29 NOTE — Plan of Care (Signed)
°  Problem: Education: Goal: Ability to describe self-care measures that may prevent or decrease complications (Diabetes Survival Skills Education) will improve 03/29/2024 1703 by Doretta Harlene SAUNDERS, RN Outcome: Progressing 03/29/2024 1703 by Doretta Harlene SAUNDERS, RN Outcome: Progressing   Problem: Coping: Goal: Ability to adjust to condition or change in health will improve 03/29/2024 1703 by Doretta Harlene SAUNDERS, RN Outcome: Progressing 03/29/2024 1703 by Doretta Harlene SAUNDERS, RN Outcome: Progressing   Problem: Fluid Volume: Goal: Ability to maintain a balanced intake and output will improve 03/29/2024 1703 by Doretta Harlene SAUNDERS, RN Outcome: Progressing 03/29/2024 1703 by Doretta Harlene SAUNDERS, RN Outcome: Progressing   Problem: Health Behavior/Discharge Planning: Goal: Ability to identify and utilize available resources and services will improve 03/29/2024 1703 by Doretta Harlene SAUNDERS, RN Outcome: Progressing 03/29/2024 1703 by Doretta Harlene SAUNDERS, RN Outcome: Progressing Goal: Ability to manage health-related needs will improve 03/29/2024 1703 by Doretta Harlene SAUNDERS, RN Outcome: Progressing 03/29/2024 1703 by Doretta Harlene SAUNDERS, RN Outcome: Progressing

## 2024-03-29 NOTE — Progress Notes (Signed)
 Day shift RN and Night Shift RN came to pt room for hand off where pt was seen down on the floor, sitting on her bottom, stated she fell when she was trying to pee, fell on her knees, did not hit her head and denied any pain. Vital signs taken, MD were informed.  Tried to call the daughter to informed but it was just ringing.

## 2024-03-29 NOTE — Progress Notes (Signed)
°   03/29/24 0735  What Happened  Was fall witnessed? No  Was patient injured? No  Patient found on floor  Found by Staff-comment (Day shift RN and Night Shift RN)  Stated prior activity other (comment) (sleeping)  Provider Notification  Provider Name/Title Dr. Chapman Rota  Date Provider Notified 03/29/24  Time Provider Notified [phone]  Method of Notification Page (Secure Chat)  Notification Reason Fall  Provider response Other (Comment) (To assess pt during the rounds)  Date of Provider Response 03/29/24  Time of Provider Response 0800  Follow Up  Family notified Yes - comment (Trying to call the daughter but no answers)  Time family notified 0805  Additional tests No  Simple treatment Other (comment)  Progress note created (see row info) Yes  Adult Fall Risk Assessment  Risk Factor Category (scoring not indicated) High fall risk per protocol (document High fall risk)  Patient Fall Risk Level High fall risk  Adult Fall Risk Interventions  Required Bundle Interventions *See Row Information* High fall risk  Additional Interventions PT/OT need assessed if change in mobility from baseline;Room near nurses station;Use of appropriate toileting equipment (bedpan, BSC, etc.)  Screening for Fall Injury Risk (To be completed on HIGH fall risk patients) - Assessing Need for Floor Mats  Risk For Fall Injury- Criteria for Floor Mats Previous fall this admission  Will Implement Floor Mats Yes  Pain Assessment  Pain Scale 0-10  Pain Score 0  Neurological  Level of Consciousness Alert  Orientation Level Oriented to person;Oriented to place  Cognition Impulsive;Poor safety awareness  Speech Clear

## 2024-03-29 NOTE — Progress Notes (Signed)
 Physical Therapy Treatment Patient Details Name: Amanda [name] MRN: [id number] DOB: 1960/07/12 Today's Date: 03/29/2024   History of Present Illness 63 yo female presents to [facility] on 9/22 with lethargy and near-syncope s/p cocaine and opioid ingestion. 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 S/p R frontal ventriculostomy, burr holes 9/22. ETT 9/22-9/23. S/p EVD removal 10/3. PEG tube removed 12/2 following improved p.o. intake. PMHx of cocaine abuse, CHF, CKD, chronic cough, HTN and DM2.    PT Comments  Patient initially agreeable to working with PT. Impulsively climbing out of bed as railing being put down. Identified that she should use the RW for ambulation, however did not want PT to take up the floor mat prior to standing/walking. She initially carried the RW a few steps until she was clear of the mat(!). Walking with slightly flexed posture with RW slightly too far ahead however would not adjust/correct with cues. Nursing approached and wanted to discuss her fall earlier today and she became agitated. Swung the RW around and walked back to her room with RW and CGA. Could not get pt to engage in balance activities on floor mat, however she decided she wanted to clean off her bed from extra blankets and carried a blanket ~10 ft without UE support (with CGA). She knocked some items on the floor with the blanket and used single UE support to reach to the floor x 5 reps to retrieve objects without dizziness or imbalance. On return walk to bed she self-selected to use RW and maneuvered RW up onto floor mat without incident. Pt refused OOB to chair and back to bed with alarm in place and 3 bedrails up. RN made aware     If plan is discharge home, recommend the following: Supervision due to cognitive status;Assist for transportation;Help with stairs or ramp for entrance;Assistance with cooking/housework;Direct  supervision/assist for medications management;Direct supervision/assist for financial management;A little help with walking and/or transfers   Can travel by private vehicle     Yes  Equipment Recommendations  Rolling walker (2 wheels);BSC/3in1    Recommendations for Other Services       Precautions / Restrictions Precautions Precautions: Fall Recall of Precautions/Restrictions: Impaired Precaution/Restrictions Comments: PEG, SBP < 160, impulsive Restrictions Weight Bearing Restrictions Per Provider Order: No     Mobility  Bed Mobility Overal bed mobility: Needs Assistance Bed Mobility: Supine to Sit, Sit to Supine     Supine to sit: Contact guard, HOB elevated Sit to supine: Supervision, HOB elevated   General bed mobility comments: moves quickly as PT putting down side rail; denied dizziness    Transfers Overall transfer level: Needs assistance Equipment used: Rolling walker (2 wheels) Transfers: Sit to/from Stand Sit to Stand: Contact guard assist           General transfer comment: Stood from bed with VC for hand placement each time (4 reps total)    Ambulation/Gait Ambulation/Gait assistance: Contact guard assist, Min assist Gait Distance (Feet): 90 Feet Assistive device: Rolling walker (2 wheels), None Gait Pattern/deviations: Step-through pattern, Decreased stride length, Trunk flexed, Drifts right/left Gait velocity: Dec     General Gait Details: After 45 ft nursing attempted to talk to her about her fall this morning and she got angry/agitated and quickly turned around, slamming the RW against the floor and then returned to her room. Wanted to put away the linens on her bed and carried a heavy blanket ~10 ft without a device and  CGA. On return to bed she used RW (self-selected)   Comptroller Bed    Modified Rankin (Stroke Patients Only) Modified Rankin (Stroke Patients Only) Pre-Morbid Rankin Score:  No symptoms Modified Rankin: Moderately severe disability     Balance Overall balance assessment: Needs assistance Sitting-balance support: No upper extremity supported, Feet supported Sitting balance-Leahy Scale: Fair   Postural control: Posterior lean Standing balance support: During functional activity, No upper extremity supported Standing balance-Leahy Scale: Fair Standing balance comment: carried item 10 ft across room without device; knocked items on the floor and with single UE support bent to floor to retrieve 5 items; denied dizziness               High Level Balance Comments: standing on floor mat without RW with CGA; would not reach outside BOS as she says it will make her lose her balance            Communication Communication Communication: No apparent difficulties  Cognition Arousal: Alert Behavior During Therapy: Impulsive   PT - Cognitive impairments: Safety/Judgement, Problem solving, Attention, Awareness                       PT - Cognition Comments: Pt easily distracted requiring frequent redirection and cues for attention. Was intermittently agitated when nursing staff tried to talk to her about a fall she had this morning. Again agitated when MD also spoke of the fall Following commands: Impaired Following commands impaired: Follows one step commands inconsistently    Cueing Cueing Techniques: Verbal cues, Gestural cues  Exercises      General Comments        Pertinent Vitals/Pain Pain Assessment Pain Assessment: No/denies pain    Home Living                          Prior Function            PT Goals (current goals can now be found in the care plan section) Acute Rehab PT Goals Patient Stated Goal: to go home PT Goal Formulation: With patient Time For Goal Achievement: 04/12/24 Potential to Achieve Goals: Fair Progress towards PT goals: Goals updated    Frequency    Min 1X/week      PT Plan       Co-evaluation              AM-PAC PT 6 Clicks Mobility   Outcome Measure  Help needed turning from your back to your side while in a flat bed without using bedrails?: A Little Help needed moving from lying on your back to sitting on the side of a flat bed without using bedrails?: A Little Help needed moving to and from a bed to a chair (including a wheelchair)?: A Little Help needed standing up from a chair using your arms (e.g., wheelchair or bedside chair)?: A Little Help needed to walk in hospital room?: A Little Help needed climbing 3-5 steps with a railing? : A Little 6 Click Score: 18    End of Session Equipment Utilized During Treatment: Gait belt Activity Tolerance: Patient tolerated treatment well Patient left: with call bell/phone within reach;in bed;with bed alarm set (pt refused chair) Nurse Communication: Mobility status;Other (comment) (upper bed rail down) PT Visit Diagnosis: Unsteadiness on feet (R26.81);Difficulty in walking, not elsewhere classified (R26.2);Other abnormalities of  gait and mobility (R26.89);Other symptoms and signs involving the nervous system (R29.898);Muscle weakness (generalized) (M62.81)     Time: 9053-8992 PT Time Calculation (min) (ACUTE ONLY): 21 min  Charges:    $Therapeutic Activity: 8-22 mins PT General Charges $$ ACUTE PT VISIT: 1 Visit                      Macario RAMAN, PT Acute Rehabilitation Services  Office [phone]    Macario SHAUNNA Soja 03/29/2024, 10:21 AM

## 2024-03-29 NOTE — Progress Notes (Signed)
 PROGRESS NOTE  Amanda [name]  DOB: 04/01/1961  PCP: Patient, No Pcp Per FMW:[id number]  DOA: 01/03/2024  LOS: 86 days  Hospital Day: 87  Subjective: Patient was seen and examined this morning. Afebrile, hemodynamically stable, breathing on room air. Per RN, patient had all fall this morning.  She was found on the floor.  Patient states she fell on her knees, did not hit her head.  No external injuries were noted. At the time of my evaluation, patient was sitting up at the edge of the bed.  Was being evaluated by PT.  Patient states she slipped on her socks and fell.  Brief narrative: Amanda [name] is a 63 y.o. female with PMH significant for DM2, HTN, CHF, CKD, cocaine use, 9/22, patient presented with lethargy, near syncope. Found to be severely hypertensive UDS was positive for cocaine CT head showed no acute right ganglial thalamic IPH with intraventricular extension with associated early hydrocephalus. Patient was admitted to neuro ICU for close blood pressure management.  Neurology and neurosurgery were consulted.  Patient underwent right frontal ventriculostomy. EVD eventually removed on 10/3, scalp staples removed 02/01/2024.          Hospital course remarkable for dysphagia, underwent PEG tube placement by IR on 10/16.  Over the course of next several weeks, oral intake gradually improved.  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 EVD eventually removed on 10/3,  Has residual left hemiparesis Seen by PT.  SNF recommended. Awaiting placement  Dysphagia Moderate malnutrition underwent PEG tube placement by IR on 10/16.  Over the course of next several weeks, oral intake gradually improved.  PEG tube removed on  12/2 Currently on dysphagia 3 diet  Tolerating diet without difficulty Continue feeding supplement Bowel regimen with MiraLAX  as needed  Acute metabolic encephalopathy  impaired mobility  Fall Patient has impaired cognition and impaired mobility poststroke.  Patient is impulsive and unaware of her deficits.  Had fall in the hospital Mental status has gradually improved.  Off sitter since 12/8. Currently on Seroquel  25 mg nightly, IV olanzapine  5 mg every 8 hours PRN   Chronic combined systolic and diastolic CHF  Hypertension CHF compensated, blood pressure stable  Currently on Coreg  25 mg twice daily, Entresto  97-103 mg twice daily, Aldactone  25 mg daily, IV and oral hydralazine  as needed  Type 2 diabetes mellitus A1c 6.6 in 01/01/2024 Currently on SSI/Accu-Cheks Recent Labs  Lab 03/24/24 2021  GLUCAP 193*   Cocaine abuse  Consulted TOC for cocaine cessation resources.  Fall 12/17  Per RN, patient had all fall this morning.  She was found on the floor.  Patient states she fell on her knees, did not hit her head.  No external injuries were noted. At the time of my evaluation, patient was sitting up at the edge of the bed.  Was being evaluated by PT.  Patient states she slipped on her socks and fell.  I do not see any need of scans at this time.    Nutrition Status: Nutrition Problem: Moderate Malnutrition Etiology: social / environmental circumstances (polysubstance abuse) Signs/Symptoms: mild muscle depletion, moderate fat depletion, mild fat depletion, moderate muscle depletion Interventions: Ensure Enlive (each supplement provides 350kcal and 20 grams of protein), MVI   Goals of care  Code Status: Full Code     DVT prophylaxis:  Place and maintain sequential compression device Start: 01/03/24 0611 SCDs Start: 01/03/24 0429   Antimicrobials: None Fluid: None Consultants: None Family Communication: None at bedside  Status: Inpatient Level of care:  Med-Surg    Patient is from: Home Needs to continue in-hospital care: Pending SNF approval.  Per case worker note, no bed offer    Diet:  Diet Order             DIET DYS 3 Room service appropriate? Yes with Assist; Fluid consistency: Thin  Diet effective now                   Scheduled Meds:  carvedilol   25 mg Oral BID WC   feeding supplement  237 mL Oral TID BM   mineral oil-hydrophilic petrolatum    Topical BID   QUEtiapine   25 mg Oral QAC supper   sacubitril -valsartan   1 tablet Oral BID   spironolactone   25 mg Oral Daily    PRN meds: acetaminophen  **OR** acetaminophen  (TYLENOL ) oral liquid 160 mg/5 mL **OR** acetaminophen , benzonatate , hydrALAZINE , hydrALAZINE , ipratropium-albuterol , labetalol , menthol , OLANZapine , mouth rinse, polyethylene glycol, white petrolatum    Infusions:    Antimicrobials: Anti-infectives (From admission, onward)    Start     Dose/Rate Route Frequency Ordered Stop   01/27/24 0807  ceFAZolin  (ANCEF ) IVPB 2g/100 mL premix        over 30 Minutes Intravenous Continuous PRN 01/27/24 0807 01/27/24 0807   01/13/24 0600  ceFAZolin  (ANCEF ) IVPB 2g/100 mL premix        2 g 200 mL/hr over 30 Minutes Intravenous On call to O.R. 01/11/24 2300 01/13/24 0557       Objective: Vitals:   03/29/24 0741 03/29/24 1559  BP: (!) 155/97 (!) 142/84  Pulse: 96 80  Resp: 16 15  Temp: (!) 97.5 F (36.4 C) 98 F (36.7 C)  SpO2: 99% 100%    Intake/Output Summary (Last 24 hours) at 03/29/2024 1642 Last data filed at 03/28/2024 2130 Gross per 24 hour  Intake 60 ml  Output --  Net 60 ml   Filed Weights   03/26/24 0500 03/28/24 0500 03/29/24 0500  Weight: 55.2 kg 54.6 kg 55.2 kg   Weight change: 0.6 kg Body mass index is 20.25 kg/m.   Physical Exam: General exam: Pleasant, middle-aged African-American female.  Not in distress Skin: No rashes, lesions or ulcers. HEENT: Atraumatic, normocephalic, no obvious bleeding Lungs: Clear to auscultation bilaterally,   CVS: S1, S2, no murmur,   GI/Abd: Soft, nontender, nondistended, bowel sound present,   CNS: Alert, awake, slow to respond.  Impulsive but able to ambulate. Psychiatry: Mood appropriate Extremities: No pedal edema, no calf tenderness,   Data Review: I have personally reviewed the laboratory data and studies available.  F/u labs ordered Unresulted Labs (From admission, onward)    None       Signed, Chapman Rota, MD [facility] 03/29/2024

## 2024-03-29 NOTE — Plan of Care (Signed)
°  Problem: Health Behavior/Discharge Planning: Goal: Ability to manage health-related needs will improve Outcome: Progressing   Problem: Nutritional: Goal: Maintenance of adequate nutrition will improve Outcome: Progressing   Problem: Skin Integrity: Goal: Risk for impaired skin integrity will decrease Outcome: Progressing   Problem: Health Behavior/Discharge Planning: Goal: Ability to manage health-related needs will improve Outcome: Progressing   Problem: Clinical Measurements: Goal: Will remain free from infection Outcome: Progressing   Problem: Activity: Goal: Risk for activity intolerance will decrease Outcome: Progressing   Problem: Coping: Goal: Level of anxiety will decrease Outcome: Progressing   Problem: Elimination: Goal: Will not experience complications related to bowel motility Outcome: Progressing   Problem: Safety: Goal: Ability to remain free from injury will improve Outcome: Progressing   Problem: Intracerebral Hemorrhage Tissue Perfusion: Goal: Complications of Intracerebral Hemorrhage will be minimized Outcome: Progressing

## 2024-03-30 DIAGNOSIS — I610 Nontraumatic intracerebral hemorrhage in hemisphere, subcortical: Secondary | ICD-10-CM | POA: Diagnosis not present

## 2024-03-30 MED ORDER — SENNOSIDES-DOCUSATE SODIUM 8.6-50 MG PO TABS
1.0000 | ORAL_TABLET | Freq: Every day | ORAL | Status: DC
  Administered 2024-03-30 – 2024-04-18 (×12): 1 via ORAL
  Filled 2024-03-30 (×13): qty 1

## 2024-03-30 NOTE — Progress Notes (Addendum)
 PROGRESS NOTE  Amanda [name]  DOB: Feb 07, 1961  PCP: Patient, No Pcp Per FMW:[id number]  DOA: 01/03/2024  LOS: 87 days  Hospital Day: 88  Subjective: Patient was seen and examined this morning. Sitting up in bed not in distress.  No family at bedside. Denies any recurrent fall after yesterday morning Afebrile, hemodynamically stable, breathing on room air Pending placement  Brief narrative: Amanda [name] is a 63 y.o. female with PMH significant for DM2, HTN, CHF, CKD, cocaine use, 9/22, patient presented with lethargy, near syncope. Found to be severely hypertensive UDS was positive for cocaine CT head showed no acute right ganglial thalamic IPH with intraventricular extension with associated early hydrocephalus. Patient was admitted to neuro ICU for close blood pressure management.  Neurology and neurosurgery were consulted.  Patient underwent right frontal ventriculostomy. EVD eventually removed on 10/3, scalp staples removed 02/01/2024.          Hospital course remarkable for dysphagia, underwent PEG tube placement by IR on 10/16.  Over the course of next several weeks, oral intake gradually improved.  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 EVD eventually removed on 10/3,  Has residual left hemiparesis Seen by PT.  SNF recommended. Awaiting placement  Dysphagia Moderate malnutrition underwent PEG tube placement by IR on 10/16.  Over the course of next several weeks, oral intake gradually improved.  PEG tube removed on 12/2 Currently on dysphagia 3 diet  Tolerating diet without difficulty Continue feeding supplement Bowel regimen with MiraLAX  as needed  Acute metabolic encephalopathy  impaired mobility   Fall Patient has impaired cognition and impaired mobility poststroke.  Patient is impulsive and unaware of her deficits.  Had fall twice in the hospital, last on 12/17.  No external or internal injury. Mental status has gradually improved.  Off sitter since 12/8. Currently on Seroquel  25 mg nightly, IV olanzapine  5 mg every 8 hours PRN   Chronic combined systolic and diastolic CHF  Hypertension CHF compensated, blood pressure stable  Currently on Coreg  25 mg twice daily, Entresto  97-103 mg twice daily, Aldactone  25 mg daily, IV and oral hydralazine  as needed  Type 2 diabetes mellitus A1c 6.6 in 01/01/2024 Currently on SSI/Accu-Cheks Recent Labs  Lab 03/24/24 2021  GLUCAP 193*   Cocaine abuse  Consulted TOC for cocaine cessation resources.     Nutrition Status: Nutrition Problem: Moderate Malnutrition Etiology: social / environmental circumstances (polysubstance abuse) Signs/Symptoms: mild muscle depletion, moderate fat depletion, mild fat depletion, moderate muscle depletion Interventions: Ensure Enlive (each supplement provides 350kcal and 20 grams of protein), MVI   Goals of care   Code Status: Full Code     DVT prophylaxis:  Place and maintain sequential compression device Start: 01/03/24 0611 SCDs Start: 01/03/24 0429   Antimicrobials: None Fluid: None Consultants: None Family Communication: None at bedside  Status: Inpatient Level of care:  Med-Surg   Patient is from: Home Needs to continue in-hospital care: Pending SNF approval.  Per case worker note, no bed offer    Diet:  Diet Order             DIET DYS 3 Room service appropriate? Yes with Assist; Fluid consistency: Thin  Diet effective now  Scheduled Meds:  carvedilol   25 mg Oral BID WC   feeding supplement  237 mL Oral TID BM   mineral oil-hydrophilic petrolatum    Topical BID   QUEtiapine   25 mg Oral QAC supper   sacubitril -valsartan   1 tablet Oral BID   senna-docusate   1 tablet Oral QHS   spironolactone   25 mg Oral Daily    PRN meds: acetaminophen  **OR** acetaminophen  (TYLENOL ) oral liquid 160 mg/5 mL **OR** acetaminophen , benzonatate , hydrALAZINE , hydrALAZINE , ipratropium-albuterol , labetalol , menthol , OLANZapine , mouth rinse, polyethylene glycol, white petrolatum    Infusions:    Antimicrobials: Anti-infectives (From admission, onward)    Start     Dose/Rate Route Frequency Ordered Stop   01/27/24 0807  ceFAZolin  (ANCEF ) IVPB 2g/100 mL premix        over 30 Minutes Intravenous Continuous PRN 01/27/24 0807 01/27/24 0807   01/13/24 0600  ceFAZolin  (ANCEF ) IVPB 2g/100 mL premix        2 g 200 mL/hr over 30 Minutes Intravenous On call to O.R. 01/11/24 2300 01/13/24 0557       Objective: Vitals:   03/30/24 0521 03/30/24 0742  BP: 128/80 125/83  Pulse: 66 73  Resp: 17 18  Temp: 98.2 F (36.8 C) 97.7 F (36.5 C)  SpO2: 100% 100%   No intake or output data in the 24 hours ending 03/30/24 1130  Filed Weights   03/26/24 0500 03/28/24 0500 03/29/24 0500  Weight: 55.2 kg 54.6 kg 55.2 kg   Weight change:  Body mass index is 20.25 kg/m.   Physical Exam: General exam: Pleasant, middle-aged African-American female.  Not in distress Skin: No rashes, lesions or ulcers. HEENT: Atraumatic, normocephalic, no obvious bleeding Lungs: Clear to auscultation bilaterally,  CVS: S1, S2, no murmur,   GI/Abd: Soft, nontender, nondistended, bowel sound present,   CNS: Alert, awake, slow to respond.  Impulsive but able to ambulate. Psychiatry: Mood appropriate Extremities: No pedal edema, no calf tenderness,   Data Review: I have personally reviewed the laboratory data and studies available.  F/u labs ordered Unresulted Labs (From admission, onward)    None       Signed, Chapman Rota, MD [facility] 03/30/2024

## 2024-03-30 NOTE — Plan of Care (Signed)
 Problem: Education: Goal: Ability to describe self-care measures that may prevent or decrease complications (Diabetes Survival Skills Education) will improve Outcome: Progressing   Problem: Coping: Goal: Ability to adjust to condition or change in health will improve Outcome: Progressing   Problem: Fluid Volume: Goal: Ability to maintain a balanced intake and output will improve Outcome: Progressing   Problem: Health Behavior/Discharge Planning: Goal: Ability to identify and utilize available resources and services will improve Outcome: Progressing Goal: Ability to manage health-related needs will improve Outcome: Progressing   Problem: Metabolic: Goal: Ability to maintain appropriate glucose levels will improve Outcome: Progressing   Problem: Nutritional: Goal: Maintenance of adequate nutrition will improve Outcome: Progressing Goal: Progress toward achieving an optimal weight will improve Outcome: Progressing   Problem: Skin Integrity: Goal: Risk for impaired skin integrity will decrease Outcome: Progressing   Problem: Tissue Perfusion: Goal: Adequacy of tissue perfusion will improve Outcome: Progressing   Problem: Education: Goal: Knowledge of General Education information will improve Description: Including pain rating scale, medication(s)/side effects and non-pharmacologic comfort measures Outcome: Progressing   Problem: Health Behavior/Discharge Planning: Goal: Ability to manage health-related needs will improve Outcome: Progressing   Problem: Clinical Measurements: Goal: Ability to maintain clinical measurements within normal limits will improve Outcome: Progressing Goal: Will remain free from infection Outcome: Progressing Goal: Diagnostic test results will improve Outcome: Progressing Goal: Respiratory complications will improve Outcome: Progressing Goal: Cardiovascular complication will be avoided Outcome: Progressing   Problem: Activity: Goal:  Risk for activity intolerance will decrease Outcome: Progressing   Problem: Nutrition: Goal: Adequate nutrition will be maintained Outcome: Progressing   Problem: Coping: Goal: Level of anxiety will decrease Outcome: Progressing   Problem: Elimination: Goal: Will not experience complications related to bowel motility Outcome: Progressing Goal: Will not experience complications related to urinary retention Outcome: Progressing   Problem: Pain Managment: Goal: General experience of comfort will improve and/or be controlled Outcome: Progressing   Problem: Safety: Goal: Ability to remain free from injury will improve Outcome: Progressing   Problem: Skin Integrity: Goal: Risk for impaired skin integrity will decrease Outcome: Progressing   Problem: Education: Goal: Knowledge of disease or condition will improve Outcome: Progressing Goal: Knowledge of secondary prevention will improve (MUST DOCUMENT ALL) Outcome: Progressing Goal: Knowledge of patient specific risk factors will improve (DELETE if not current risk factor) Outcome: Progressing   Problem: Intracerebral Hemorrhage Tissue Perfusion: Goal: Complications of Intracerebral Hemorrhage will be minimized Outcome: Progressing   Problem: Coping: Goal: Will verbalize positive feelings about self Outcome: Progressing Goal: Will identify appropriate support needs Outcome: Progressing   Problem: Health Behavior/Discharge Planning: Goal: Ability to manage health-related needs will improve Outcome: Progressing Goal: Goals will be collaboratively established with patient/family Outcome: Progressing   Problem: Self-Care: Goal: Ability to participate in self-care as condition permits will improve Outcome: Progressing Goal: Verbalization of feelings and concerns over difficulty with self-care will improve Outcome: Progressing Goal: Ability to communicate needs accurately will improve Outcome: Progressing   Problem:  Nutrition: Goal: Risk of aspiration will decrease Outcome: Progressing Goal: Dietary intake will improve Outcome: Progressing   Problem: Education: Goal: Knowledge of disease or condition will improve Outcome: Progressing Goal: Knowledge of secondary prevention will improve (MUST DOCUMENT ALL) Outcome: Progressing Goal: Knowledge of patient specific risk factors will improve (DELETE if not current risk factor) Outcome: Progressing   Problem: Intracerebral Hemorrhage Tissue Perfusion: Goal: Complications of Intracerebral Hemorrhage will be minimized Outcome: Progressing   Problem: Coping: Goal: Will verbalize positive feelings about self Outcome: Progressing Goal: Will  identify appropriate support needs Outcome: Progressing   Problem: Health Behavior/Discharge Planning: Goal: Ability to manage health-related needs will improve Outcome: Progressing Goal: Goals will be collaboratively established with patient/family Outcome: Progressing   Problem: Self-Care: Goal: Ability to participate in self-care as condition permits will improve Outcome: Progressing Goal: Verbalization of feelings and concerns over difficulty with self-care will improve Outcome: Progressing Goal: Ability to communicate needs accurately will improve Outcome: Progressing

## 2024-03-30 NOTE — Plan of Care (Signed)
 Problem: Education: Goal: Ability to describe self-care measures that may prevent or decrease complications (Diabetes Survival Skills Education) will improve 03/30/2024 1452 by Jenel Bobetta SAILOR, RN Outcome: Progressing 03/30/2024 0950 by Jenel Bobetta SAILOR, RN Outcome: Progressing   Problem: Coping: Goal: Ability to adjust to condition or change in health will improve 03/30/2024 1452 by Jenel Bobetta SAILOR, RN Outcome: Progressing 03/30/2024 0950 by Jenel Bobetta SAILOR, RN Outcome: Progressing   Problem: Fluid Volume: Goal: Ability to maintain a balanced intake and output will improve 03/30/2024 1452 by Jenel Bobetta SAILOR, RN Outcome: Progressing 03/30/2024 0950 by Jenel Bobetta SAILOR, RN Outcome: Progressing   Problem: Health Behavior/Discharge Planning: Goal: Ability to identify and utilize available resources and services will improve 03/30/2024 1452 by Jenel Bobetta SAILOR, RN Outcome: Progressing 03/30/2024 0950 by Jenel Bobetta SAILOR, RN Outcome: Progressing Goal: Ability to manage health-related needs will improve 03/30/2024 1452 by Jenel Bobetta SAILOR, RN Outcome: Progressing 03/30/2024 0950 by Jenel Bobetta SAILOR, RN Outcome: Progressing   Problem: Metabolic: Goal: Ability to maintain appropriate glucose levels will improve 03/30/2024 1452 by Jenel Bobetta SAILOR, RN Outcome: Progressing 03/30/2024 0950 by Jenel Bobetta SAILOR, RN Outcome: Progressing   Problem: Nutritional: Goal: Maintenance of adequate nutrition will improve 03/30/2024 1452 by Jenel Bobetta SAILOR, RN Outcome: Progressing 03/30/2024 0950 by Jenel Bobetta SAILOR, RN Outcome: Progressing Goal: Progress toward achieving an optimal weight will improve 03/30/2024 1452 by Jenel Bobetta SAILOR, RN Outcome: Progressing 03/30/2024 0950 by Jenel Bobetta SAILOR, RN Outcome: Progressing   Problem: Skin Integrity: Goal: Risk for impaired skin integrity will decrease 03/30/2024 1452 by Jenel Bobetta SAILOR, RN Outcome: Progressing 03/30/2024 0950 by Jenel Bobetta SAILOR, RN Outcome: Progressing   Problem: Tissue Perfusion: Goal: Adequacy of tissue perfusion will improve 03/30/2024 1452 by Jenel Bobetta SAILOR, RN Outcome: Progressing 03/30/2024 0950 by Jenel Bobetta SAILOR, RN Outcome: Progressing   Problem: Education: Goal: Knowledge of General Education information will improve Description: Including pain rating scale, medication(s)/side effects and non-pharmacologic comfort measures 03/30/2024 1452 by Jenel Bobetta SAILOR, RN Outcome: Progressing 03/30/2024 0950 by Jenel Bobetta SAILOR, RN Outcome: Progressing   Problem: Health Behavior/Discharge Planning: Goal: Ability to manage health-related needs will improve 03/30/2024 1452 by Jenel Bobetta SAILOR, RN Outcome: Progressing 03/30/2024 0950 by Jenel Bobetta SAILOR, RN Outcome: Progressing   Problem: Clinical Measurements: Goal: Ability to maintain clinical measurements within normal limits will improve 03/30/2024 1452 by Jenel Bobetta SAILOR, RN Outcome: Progressing 03/30/2024 0950 by Jenel Bobetta SAILOR, RN Outcome: Progressing Goal: Will remain free from infection 03/30/2024 1452 by Jenel Bobetta SAILOR, RN Outcome: Progressing 03/30/2024 0950 by Jenel Bobetta SAILOR, RN Outcome: Progressing Goal: Diagnostic test results will improve 03/30/2024 1452 by Jenel Bobetta SAILOR, RN Outcome: Progressing 03/30/2024 0950 by Jenel Bobetta SAILOR, RN Outcome: Progressing Goal: Respiratory complications will improve 03/30/2024 1452 by Jenel Bobetta SAILOR, RN Outcome: Progressing 03/30/2024 0950 by Jenel Bobetta SAILOR, RN Outcome: Progressing Goal: Cardiovascular complication will be avoided 03/30/2024 1452 by Jenel Bobetta SAILOR, RN Outcome: Progressing 03/30/2024 0950 by Jenel Bobetta SAILOR, RN Outcome: Progressing   Problem: Activity: Goal: Risk for activity intolerance will decrease 03/30/2024 1452 by Jenel Bobetta SAILOR, RN Outcome: Progressing 03/30/2024 0950 by Jenel Bobetta SAILOR, RN Outcome: Progressing   Problem: Nutrition: Goal: Adequate  nutrition will be maintained 03/30/2024 1452 by Jenel Bobetta SAILOR, RN Outcome: Progressing 03/30/2024 0950 by Jenel Bobetta SAILOR, RN Outcome: Progressing   Problem: Coping: Goal: Level of anxiety will decrease 03/30/2024 1452 by Jenel Bobetta SAILOR, RN Outcome: Progressing 03/30/2024 0950 by Jenel,  Bobetta SAILOR, RN Outcome: Progressing   Problem: Elimination: Goal: Will not experience complications related to bowel motility 03/30/2024 1452 by Jenel Bobetta SAILOR, RN Outcome: Progressing 03/30/2024 0950 by Jenel Bobetta SAILOR, RN Outcome: Progressing Goal: Will not experience complications related to urinary retention 03/30/2024 1452 by Jenel Bobetta SAILOR, RN Outcome: Progressing 03/30/2024 0950 by Jenel Bobetta SAILOR, RN Outcome: Progressing   Problem: Pain Managment: Goal: General experience of comfort will improve and/or be controlled 03/30/2024 1452 by Jenel Bobetta SAILOR, RN Outcome: Progressing 03/30/2024 0950 by Jenel Bobetta SAILOR, RN Outcome: Progressing   Problem: Safety: Goal: Ability to remain free from injury will improve 03/30/2024 1452 by Jenel Bobetta SAILOR, RN Outcome: Progressing 03/30/2024 0950 by Jenel Bobetta SAILOR, RN Outcome: Progressing   Problem: Skin Integrity: Goal: Risk for impaired skin integrity will decrease 03/30/2024 1452 by Jenel Bobetta SAILOR, RN Outcome: Progressing 03/30/2024 0950 by Jenel Bobetta SAILOR, RN Outcome: Progressing   Problem: Education: Goal: Knowledge of disease or condition will improve 03/30/2024 1452 by Jenel Bobetta SAILOR, RN Outcome: Progressing 03/30/2024 0950 by Jenel Bobetta SAILOR, RN Outcome: Progressing Goal: Knowledge of secondary prevention will improve (MUST DOCUMENT ALL) 03/30/2024 1452 by Jenel Bobetta SAILOR, RN Outcome: Progressing 03/30/2024 0950 by Jenel Bobetta SAILOR, RN Outcome: Progressing Goal: Knowledge of patient specific risk factors will improve (DELETE if not current risk factor) 03/30/2024 1452 by Jenel Bobetta SAILOR, RN Outcome:  Progressing 03/30/2024 0950 by Jenel Bobetta SAILOR, RN Outcome: Progressing   Problem: Intracerebral Hemorrhage Tissue Perfusion: Goal: Complications of Intracerebral Hemorrhage will be minimized 03/30/2024 1452 by Jenel Bobetta SAILOR, RN Outcome: Progressing 03/30/2024 0950 by Jenel Bobetta SAILOR, RN Outcome: Progressing   Problem: Coping: Goal: Will verbalize positive feelings about self 03/30/2024 1452 by Jenel Bobetta SAILOR, RN Outcome: Progressing 03/30/2024 0950 by Jenel Bobetta SAILOR, RN Outcome: Progressing Goal: Will identify appropriate support needs 03/30/2024 1452 by Jenel Bobetta SAILOR, RN Outcome: Progressing 03/30/2024 0950 by Jenel Bobetta SAILOR, RN Outcome: Progressing   Problem: Health Behavior/Discharge Planning: Goal: Ability to manage health-related needs will improve 03/30/2024 1452 by Jenel Bobetta SAILOR, RN Outcome: Progressing 03/30/2024 0950 by Jenel Bobetta SAILOR, RN Outcome: Progressing Goal: Goals will be collaboratively established with patient/family 03/30/2024 1452 by Jenel Bobetta SAILOR, RN Outcome: Progressing 03/30/2024 0950 by Jenel Bobetta SAILOR, RN Outcome: Progressing   Problem: Self-Care: Goal: Ability to participate in self-care as condition permits will improve 03/30/2024 1452 by Jenel Bobetta SAILOR, RN Outcome: Progressing 03/30/2024 0950 by Jenel Bobetta SAILOR, RN Outcome: Progressing Goal: Verbalization of feelings and concerns over difficulty with self-care will improve 03/30/2024 1452 by Jenel Bobetta SAILOR, RN Outcome: Progressing 03/30/2024 0950 by Jenel Bobetta SAILOR, RN Outcome: Progressing Goal: Ability to communicate needs accurately will improve 03/30/2024 1452 by Jenel Bobetta SAILOR, RN Outcome: Progressing 03/30/2024 0950 by Jenel Bobetta SAILOR, RN Outcome: Progressing   Problem: Nutrition: Goal: Risk of aspiration will decrease 03/30/2024 1452 by Jenel Bobetta SAILOR, RN Outcome: Progressing 03/30/2024 0950 by Jenel Bobetta SAILOR, RN Outcome: Progressing Goal: Dietary intake  will improve 03/30/2024 1452 by Jenel Bobetta SAILOR, RN Outcome: Progressing 03/30/2024 0950 by Jenel Bobetta SAILOR, RN Outcome: Progressing   Problem: Education: Goal: Knowledge of disease or condition will improve 03/30/2024 1452 by Jenel Bobetta SAILOR, RN Outcome: Progressing 03/30/2024 0950 by Jenel Bobetta SAILOR, RN Outcome: Progressing Goal: Knowledge of secondary prevention will improve (MUST DOCUMENT ALL) 03/30/2024 1452 by Jenel Bobetta SAILOR, RN Outcome: Progressing 03/30/2024 0950 by Jenel Bobetta SAILOR, RN Outcome: Progressing Goal: Knowledge of patient specific risk factors  will improve (DELETE if not current risk factor) 03/30/2024 1452 by Jenel Bobetta SAILOR, RN Outcome: Progressing 03/30/2024 0950 by Jenel Bobetta SAILOR, RN Outcome: Progressing   Problem: Intracerebral Hemorrhage Tissue Perfusion: Goal: Complications of Intracerebral Hemorrhage will be minimized 03/30/2024 1452 by Jenel Bobetta SAILOR, RN Outcome: Progressing 03/30/2024 0950 by Jenel Bobetta SAILOR, RN Outcome: Progressing   Problem: Coping: Goal: Will verbalize positive feelings about self 03/30/2024 1452 by Jenel Bobetta SAILOR, RN Outcome: Progressing 03/30/2024 0950 by Jenel Bobetta SAILOR, RN Outcome: Progressing Goal: Will identify appropriate support needs 03/30/2024 1452 by Jenel Bobetta SAILOR, RN Outcome: Progressing 03/30/2024 0950 by Jenel Bobetta SAILOR, RN Outcome: Progressing   Problem: Health Behavior/Discharge Planning: Goal: Ability to manage health-related needs will improve 03/30/2024 1452 by Jenel Bobetta SAILOR, RN Outcome: Progressing 03/30/2024 0950 by Jenel Bobetta SAILOR, RN Outcome: Progressing Goal: Goals will be collaboratively established with patient/family 03/30/2024 1452 by Jenel Bobetta SAILOR, RN Outcome: Progressing 03/30/2024 0950 by Jenel Bobetta SAILOR, RN Outcome: Progressing   Problem: Self-Care: Goal: Ability to participate in self-care as condition permits will improve 03/30/2024 1452 by Jenel Bobetta SAILOR, RN Outcome:  Progressing 03/30/2024 0950 by Jenel Bobetta SAILOR, RN Outcome: Progressing Goal: Verbalization of feelings and concerns over difficulty with self-care will improve 03/30/2024 1452 by Jenel Bobetta SAILOR, RN Outcome: Progressing 03/30/2024 0950 by Jenel Bobetta SAILOR, RN Outcome: Progressing Goal: Ability to communicate needs accurately will improve 03/30/2024 1452 by Jenel Bobetta SAILOR, RN Outcome: Progressing 03/30/2024 0950 by Jenel Bobetta SAILOR, RN Outcome: Progressing

## 2024-03-30 NOTE — Progress Notes (Signed)
 Speech Language Pathology Treatment: Cognitive-Linguistic  Patient Details Name: Amanda [name] MRN: [id number] DOB: 12-07-1960 Today's Date: 03/30/2024 Time: 8897-8887 SLP Time Calculation (min) (ACUTE ONLY): 10 min  Assessment / Plan / Recommendation Clinical Impression  SLP conducted skilled therapy session targeting cognition goals. Targeted awareness and recall this date with patient and SLP discussing admission and plans for discharge. Patient indicates frustration with current lack of communication re: discharge, though suspect communication is present and is likely being forgotten. Patient does remember broad details of stay but requires min assist to recall specifics. She requires min to mod assist for anticipatory awareness of deficits and challenges at next venue and requires reeducation re: role of social worker and their plan to find next venue of care. Patient is making limited progress given limited insight to deficits and reduced frequency of SLP sessions. SLP will sign off acutely, however recommend patient resume SLP services at next venue of care when more intensive therapy interventions can be implemented to assist with cognition and carryover.    HPI HPI: [name] with polysubstance/cocaine abuse 3 hrs before presentation, who called EMS for AMS, lethargy and near syncope. Her condition deteriorated (unable to protect her airway and more lethargic) and got intubated. 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 S/o PEG placement by IR on 10/16.  Tube feeds stopped 02/18/24, currently on supplements, plan to remove PEG 03/19/24 if oral intake better. HTN, CHF, DM-2.      SLP Plan  Continue with current plan of care        Swallow Evaluation Recommendations          Recommendations                     Oral care BID   Frequent or constant Supervision/Assistance Cognitive communication deficit (M58.158)     Continue with current plan of care     Rosina A Nuria Phebus  03/30/2024, 2:17 PM

## 2024-03-31 DIAGNOSIS — I610 Nontraumatic intracerebral hemorrhage in hemisphere, subcortical: Secondary | ICD-10-CM | POA: Diagnosis not present

## 2024-03-31 NOTE — Progress Notes (Signed)
 " PROGRESS NOTE  Amanda [name]  DOB: 29-Jul-1960  PCP: Patient, No Pcp Per FMW:[id number]  DOA: 01/03/2024  LOS: 88 days  Hospital Day: 89  Subjective: Patient was seen and examined this morning. Sitting up in bed.  Not in distress Afebrile, hemodynamically stable, breathing on room air Pending placement  Brief narrative: Amanda [name] is a 63 y.o. female with PMH significant for DM2, HTN, CHF, CKD, cocaine use, 9/22, patient presented with lethargy, near syncope. Found to be severely hypertensive UDS was positive for cocaine CT head showed no acute right ganglial thalamic IPH with intraventricular extension with associated early hydrocephalus. Patient was admitted to neuro ICU for close blood pressure management.  Neurology and neurosurgery were consulted.  Patient underwent right frontal ventriculostomy. EVD eventually removed on 10/3, scalp staples removed 02/01/2024.          Hospital course remarkable for dysphagia, underwent PEG tube placement by IR on 10/16.  Over the course of next several weeks, oral intake gradually improved.  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 EVD eventually removed on 10/3,  Has residual left hemiparesis Seen by PT.  SNF recommended. Awaiting placement  Dysphagia Moderate malnutrition underwent PEG tube placement by IR on 10/16.  Over the course of next several weeks, oral intake gradually improved.  PEG tube removed on 12/2 Currently on dysphagia 3 diet  Tolerating diet without difficulty Continue feeding supplement Bowel regimen with MiraLAX  as needed  Acute metabolic encephalopathy  impaired mobility  Fall Patient has impaired cognition and impaired mobility poststroke.   Patient is impulsive and unaware of her deficits.  Had fall twice in the hospital, last on 12/17.  No external or internal injury. Mental status has gradually improved.  Off sitter since 12/8. Currently on Seroquel  25 mg nightly, IV olanzapine  5 mg every 8 hours PRN   Chronic combined systolic and diastolic CHF  Hypertension CHF compensated, blood pressure stable  Currently on Coreg  25 mg twice daily, Entresto  97-103 mg twice daily, Aldactone  25 mg daily, IV and oral hydralazine  as needed  Type 2 diabetes mellitus A1c 6.6 in 01/01/2024 Currently on SSI/Accu-Cheks Recent Labs  Lab 03/24/24 2021  GLUCAP 193*   Cocaine abuse  Consulted TOC for cocaine cessation resources.     Nutrition Status: Nutrition Problem: Moderate Malnutrition Etiology: social / environmental circumstances (polysubstance abuse) Signs/Symptoms: mild muscle depletion, moderate fat depletion, mild fat depletion, moderate muscle depletion Interventions: Ensure Enlive (each supplement provides 350kcal and 20 grams of protein), MVI   Goals of care   Code Status: Full Code     DVT prophylaxis:  Place and maintain sequential compression device Start: 01/03/24 0611 SCDs Start: 01/03/24 0429   Antimicrobials: None Fluid: None Consultants: None Family Communication: None at bedside  Status: Inpatient Level of care:  Med-Surg   Patient is from: Home Needs to continue in-hospital care: Pending SNF approval.  Per case worker note, no bed offer    Diet:  Diet Order             DIET DYS 3 Room service appropriate? Yes with Assist; Fluid consistency: Thin  Diet effective now                   Scheduled Meds:  carvedilol   25  mg Oral BID WC   feeding supplement  237 mL Oral TID BM   mineral oil-hydrophilic petrolatum    Topical BID   QUEtiapine   25 mg Oral QAC supper   sacubitril -valsartan   1 tablet Oral BID   senna-docusate  1 tablet Oral QHS   spironolactone   25 mg Oral Daily    PRN  meds: acetaminophen  **OR** acetaminophen  (TYLENOL ) oral liquid 160 mg/5 mL **OR** acetaminophen , benzonatate , hydrALAZINE , hydrALAZINE , ipratropium-albuterol , labetalol , menthol , OLANZapine , mouth rinse, polyethylene glycol, white petrolatum    Infusions:    Antimicrobials: Anti-infectives (From admission, onward)    Start     Dose/Rate Route Frequency Ordered Stop   01/27/24 0807  ceFAZolin  (ANCEF ) IVPB 2g/100 mL premix        over 30 Minutes Intravenous Continuous PRN 01/27/24 0807 01/27/24 0807   01/13/24 0600  ceFAZolin  (ANCEF ) IVPB 2g/100 mL premix        2 g 200 mL/hr over 30 Minutes Intravenous On call to O.R. 01/11/24 2300 01/13/24 0557       Objective: Vitals:   03/31/24 0820 03/31/24 0824  BP: (!) 122/91 (!) 122/91  Pulse: 93 93  Resp: 18   Temp: 98.5 F (36.9 C)   SpO2: 98%    No intake or output data in the 24 hours ending 03/31/24 0859  Filed Weights   03/28/24 0500 03/29/24 0500 03/31/24 0600  Weight: 54.6 kg 55.2 kg 54.6 kg   Weight change:  Body mass index is 20.03 kg/m.   Physical Exam: General exam: Pleasant, middle-aged African-American female.  Not in distress Skin: No rashes, lesions or ulcers. HEENT: Atraumatic, normocephalic, no obvious bleeding Lungs: Clear to auscultation bilaterally,  CVS: S1, S2, no murmur,   GI/Abd: Soft, nontender, nondistended, bowel sound present,   CNS: Alert, awake, slow to respond.  Impulsive but able to ambulate. Psychiatry: Mood appropriate Extremities: No pedal edema, no calf tenderness,   Data Review: I have personally reviewed the laboratory data and studies available.  F/u labs ordered Unresulted Labs (From admission, onward)    None       Signed, Chapman Rota, MD [facility] 03/31/2024  "

## 2024-03-31 NOTE — Plan of Care (Signed)
 OOB to chair early am and most of the day. Ambulated with therapy and rolling walker. Set up assistance with meal. Meds as ordered. Chair alarm in use.    Problem: Education: Goal: Ability to describe self-care measures that may prevent or decrease complications (Diabetes Survival Skills Education) will improve 03/31/2024 1741 by Vivian Carlos BIRCH, RN Outcome: Progressing 03/31/2024 1741 by Vivian Carlos BIRCH, RN Outcome: Progressing   Problem: Nutritional: Goal: Maintenance of adequate nutrition will improve 03/31/2024 1741 by Vivian Carlos BIRCH, RN Outcome: Progressing 03/31/2024 1741 by Vivian Carlos D, RN Outcome: Progressing   Problem: Skin Integrity: Goal: Risk for impaired skin integrity will decrease 03/31/2024 1741 by Vivian Carlos BIRCH, RN Outcome: Progressing 03/31/2024 1741 by Vivian Carlos BIRCH, RN Outcome: Progressing   Problem: Safety: Goal: Ability to remain free from injury will improve 03/31/2024 1741 by Vivian Carlos BIRCH, RN Outcome: Progressing 03/31/2024 1741 by Vivian Carlos BIRCH, RN Outcome: Progressing

## 2024-03-31 NOTE — TOC Progression Note (Signed)
 Transition of Care [facility]) - Progression Note    Patient Details  Name: Amanda [name] MRN: [id number] Date of Birth: [birth year]  Transition of Care [facility]) CM/SW Contact  Almarie CHRISTELLA Goodie, [place] Phone Number: 03/31/2024, 10:58 AM  Clinical Narrative:   CSW following for disposition. Patient continues with no bed offers. CSW to follow.    Expected Discharge Plan: Skilled Nursing Facility Barriers to Discharge: English As A Second Language Teacher, Continued Medical Work up, Inadequate or no insurance, Facility will not accept until restraint criteria met, SNF Pending bed offer               Expected Discharge Plan and Services In-house Referral: Clinical Social Work   Post Acute Care Choice: Skilled Nursing Facility Living arrangements for the past 2 months: Single Family Home                                       Social Drivers of Health (SDOH) Interventions SDOH Screenings   Food Insecurity: No Food Insecurity (07/16/2023)   Received from [facility]  Housing: Low Risk (07/16/2023)   Received from [facility]  Transportation Needs: No Transportation Needs (07/16/2023)   Received from [facility]  Utilities: Not At Risk (07/16/2023)   Received from [facility]  Alcohol Screen: Low Risk (05/14/2023)  Financial Resource Strain: Low Risk (07/16/2023)   Received from [facility]  Tobacco Use: High Risk (01/26/2024)    Readmission Risk Interventions    03/26/2022   10:50 AM  Readmission Risk Prevention Plan  Transportation Screening Complete  HRI or Home Care Consult Complete  Social Work Consult for Recovery Care Planning/Counseling Complete  Palliative Care Screening Not Applicable  Medication Review Oceanographer) Referral to Pharmacy

## 2024-03-31 NOTE — Progress Notes (Signed)
 Occupational Therapy Treatment Patient Details Name: Amanda [name] MRN: [id number] DOB: 01-13-1961 Today's Date: 03/31/2024   History of present illness 63 yo female presents to [facility] on 9/22 with lethargy and near-syncope s/p cocaine and opioid ingestion. 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 S/p R frontal ventriculostomy, burr holes 9/22. ETT 9/22-9/23. S/p EVD removal 10/3. PEG tube removed 12/2 following improved p.o. intake. PMHx of cocaine abuse, CHF, CKD, chronic cough, HTN and DM2.   OT comments  Patient continues to demonstrate gains with mobility and self care. Patient requires cues for sequencing with self care tasks and safety cues with mobility and transfers.  Patient will benefit from continued inpatient follow up therapy, <3 hours/day.  Acute OT to continue to follow to address established goals to facilitate DC to next venue of care.        If plan is discharge home, recommend the following:  Assistance with cooking/housework;Help with stairs or ramp for entrance;Direct supervision/assist for financial management;Direct supervision/assist for medications management;Assist for transportation;Supervision due to cognitive status;A little help with walking and/or transfers;A little help with bathing/dressing/bathroom   Equipment Recommendations  Other (comment) (defer)    Recommendations for Other Services      Precautions / Restrictions Precautions Precautions: Fall Recall of Precautions/Restrictions: Impaired Precaution/Restrictions Comments: PEG, SBP < 160, impulsive Restrictions Weight Bearing Restrictions Per Provider Order: No       Mobility Bed Mobility Overal bed mobility: Needs Assistance             General bed mobility comments: OOB in recliner and left in recliner at end of session    Transfers Overall transfer level: Needs assistance Equipment used:  Rolling walker (2 wheels) Transfers: Sit to/from Stand Sit to Stand: Contact guard assist           General transfer comment: CGA and verbal cues for safety     Balance Overall balance assessment: Needs assistance Sitting-balance support: No upper extremity supported, Feet supported Sitting balance-Leahy Scale: Fair     Standing balance support: During functional activity, No upper extremity supported Standing balance-Leahy Scale: Fair Standing balance comment: can stand at sink with no UE support                           ADL either performed or assessed with clinical judgement   ADL Overall ADL's : Needs assistance/impaired     Grooming: Wash/dry hands;Wash/dry face;Supervision/safety;Cueing for sequencing;Standing Grooming Details (indicate cue type and reason): at sink             Lower Body Dressing: Contact guard assist;Minimal assistance;Sit to/from stand Lower Body Dressing Details (indicate cue type and reason): min assist withRLE to feed into clothing and CGA when standing Toilet Transfer: Contact guard assist;Ambulation;Regular Toilet;Rolling walker (2 wheels)                  Extremity/Trunk Assessment              Vision       Perception     Praxis     Communication Communication Communication: No apparent difficulties   Cognition Arousal: Alert Behavior During Therapy: Impulsive Cognition: Cognition impaired     Awareness: Intellectual awareness impaired Memory impairment (select all impairments): Short-term memory, Working memory Attention impairment (select first level of impairment): Sustained attention Executive functioning impairment (select all impairments): Initiation, Sequencing, Reasoning, Problem solving  Following commands: Impaired Following commands impaired: Follows one step commands inconsistently      Cueing   Cueing Techniques: Verbal cues, Gestural cues  Exercises       Shoulder Instructions       General Comments      Pertinent Vitals/ Pain       Pain Assessment Pain Assessment: No/denies pain  Home Living                                          Prior Functioning/Environment              Frequency  Min 2X/week        Progress Toward Goals  OT Goals(current goals can now be found in the care plan section)  Progress towards OT goals: Progressing toward goals     Plan      Co-evaluation                 AM-PAC OT 6 Clicks Daily Activity     Outcome Measure   Help from another person eating meals?: None Help from another person taking care of personal grooming?: A Little Help from another person toileting, which includes using toliet, bedpan, or urinal?: A Little Help from another person bathing (including washing, rinsing, drying)?: A Little Help from another person to put on and taking off regular upper body clothing?: A Little Help from another person to put on and taking off regular lower body clothing?: A Little 6 Click Score: 19    End of Session Equipment Utilized During Treatment: Gait belt;Rolling walker (2 wheels)  OT Visit Diagnosis: Unsteadiness on feet (R26.81);Other abnormalities of gait and mobility (R26.89);Muscle weakness (generalized) (M62.81)   Activity Tolerance Patient tolerated treatment well   Patient Left in chair;with call bell/phone within reach;with chair alarm set   Nurse Communication Mobility status        Time: 9241-9185 OT Time Calculation (min): 16 min  Charges: OT General Charges $OT Visit: 1 Visit OT Treatments $Self Care/Home Management : 8-22 mins  Dick Laine, OTA Acute Rehabilitation Services  Office [phone]   Jeb LITTIE Laine 03/31/2024, 10:53 AM

## 2024-04-01 DIAGNOSIS — I5042 Chronic combined systolic (congestive) and diastolic (congestive) heart failure: Secondary | ICD-10-CM | POA: Diagnosis not present

## 2024-04-01 DIAGNOSIS — I10 Essential (primary) hypertension: Secondary | ICD-10-CM | POA: Diagnosis not present

## 2024-04-01 DIAGNOSIS — E440 Moderate protein-calorie malnutrition: Secondary | ICD-10-CM | POA: Diagnosis not present

## 2024-04-01 NOTE — Plan of Care (Signed)
 Problem: Education: Goal: Ability to describe self-care measures that may prevent or decrease complications (Diabetes Survival Skills Education) will improve Outcome: Progressing   Problem: Coping: Goal: Ability to adjust to condition or change in health will improve Outcome: Progressing   Problem: Fluid Volume: Goal: Ability to maintain a balanced intake and output will improve Outcome: Progressing   Problem: Health Behavior/Discharge Planning: Goal: Ability to identify and utilize available resources and services will improve Outcome: Progressing Goal: Ability to manage health-related needs will improve Outcome: Progressing   Problem: Metabolic: Goal: Ability to maintain appropriate glucose levels will improve Outcome: Progressing   Problem: Nutritional: Goal: Maintenance of adequate nutrition will improve Outcome: Progressing Goal: Progress toward achieving an optimal weight will improve Outcome: Progressing   Problem: Skin Integrity: Goal: Risk for impaired skin integrity will decrease Outcome: Progressing   Problem: Tissue Perfusion: Goal: Adequacy of tissue perfusion will improve Outcome: Progressing   Problem: Education: Goal: Knowledge of General Education information will improve Description: Including pain rating scale, medication(s)/side effects and non-pharmacologic comfort measures Outcome: Progressing   Problem: Health Behavior/Discharge Planning: Goal: Ability to manage health-related needs will improve Outcome: Progressing   Problem: Clinical Measurements: Goal: Ability to maintain clinical measurements within normal limits will improve Outcome: Progressing Goal: Will remain free from infection Outcome: Progressing Goal: Diagnostic test results will improve Outcome: Progressing Goal: Respiratory complications will improve Outcome: Progressing Goal: Cardiovascular complication will be avoided Outcome: Progressing   Problem: Activity: Goal:  Risk for activity intolerance will decrease Outcome: Progressing   Problem: Nutrition: Goal: Adequate nutrition will be maintained Outcome: Progressing   Problem: Coping: Goal: Level of anxiety will decrease Outcome: Progressing   Problem: Elimination: Goal: Will not experience complications related to bowel motility Outcome: Progressing Goal: Will not experience complications related to urinary retention Outcome: Progressing   Problem: Pain Managment: Goal: General experience of comfort will improve and/or be controlled Outcome: Progressing   Problem: Safety: Goal: Ability to remain free from injury will improve Outcome: Progressing   Problem: Skin Integrity: Goal: Risk for impaired skin integrity will decrease Outcome: Progressing   Problem: Education: Goal: Knowledge of disease or condition will improve Outcome: Progressing Goal: Knowledge of secondary prevention will improve (MUST DOCUMENT ALL) Outcome: Progressing Goal: Knowledge of patient specific risk factors will improve (DELETE if not current risk factor) Outcome: Progressing   Problem: Intracerebral Hemorrhage Tissue Perfusion: Goal: Complications of Intracerebral Hemorrhage will be minimized Outcome: Progressing   Problem: Coping: Goal: Will verbalize positive feelings about self Outcome: Progressing Goal: Will identify appropriate support needs Outcome: Progressing   Problem: Health Behavior/Discharge Planning: Goal: Ability to manage health-related needs will improve Outcome: Progressing Goal: Goals will be collaboratively established with patient/family Outcome: Progressing   Problem: Self-Care: Goal: Ability to participate in self-care as condition permits will improve Outcome: Progressing Goal: Verbalization of feelings and concerns over difficulty with self-care will improve Outcome: Progressing Goal: Ability to communicate needs accurately will improve Outcome: Progressing   Problem:  Nutrition: Goal: Risk of aspiration will decrease Outcome: Progressing Goal: Dietary intake will improve Outcome: Progressing   Problem: Education: Goal: Knowledge of disease or condition will improve Outcome: Progressing Goal: Knowledge of secondary prevention will improve (MUST DOCUMENT ALL) Outcome: Progressing Goal: Knowledge of patient specific risk factors will improve (DELETE if not current risk factor) Outcome: Progressing   Problem: Intracerebral Hemorrhage Tissue Perfusion: Goal: Complications of Intracerebral Hemorrhage will be minimized Outcome: Progressing   Problem: Coping: Goal: Will verbalize positive feelings about self Outcome: Progressing Goal: Will  identify appropriate support needs Outcome: Progressing   Problem: Health Behavior/Discharge Planning: Goal: Ability to manage health-related needs will improve Outcome: Progressing Goal: Goals will be collaboratively established with patient/family Outcome: Progressing   Problem: Self-Care: Goal: Ability to participate in self-care as condition permits will improve Outcome: Progressing Goal: Verbalization of feelings and concerns over difficulty with self-care will improve Outcome: Progressing Goal: Ability to communicate needs accurately will improve Outcome: Progressing

## 2024-04-01 NOTE — Plan of Care (Signed)
" °  Problem: Nutritional: Goal: Maintenance of adequate nutrition will improve Outcome: Progressing   Problem: Skin Integrity: Goal: Risk for impaired skin integrity will decrease Outcome: Progressing   Problem: Clinical Measurements: Goal: Will remain free from infection Outcome: Progressing   Problem: Activity: Goal: Risk for activity intolerance will decrease Outcome: Progressing   Problem: Coping: Goal: Level of anxiety will decrease Outcome: Progressing   Problem: Elimination: Goal: Will not experience complications related to urinary retention Outcome: Progressing   Problem: Safety: Goal: Ability to remain free from injury will improve Outcome: Progressing   Problem: Skin Integrity: Goal: Risk for impaired skin integrity will decrease Outcome: Progressing   Problem: Intracerebral Hemorrhage Tissue Perfusion: Goal: Complications of Intracerebral Hemorrhage will be minimized Outcome: Progressing   Problem: Self-Care: Goal: Ability to communicate needs accurately will improve Outcome: Progressing   "

## 2024-04-01 NOTE — Progress Notes (Signed)
 "  PROGRESS NOTE    Amanda [name]  FMW:[id number] DOB: 04-19-1960 DOA: 01/03/2024 PCP: Patient, No Pcp Per   Brief Narrative: Amanda [name] is a 63 y.o. female with a history of hypertension, heart failure, cocaine abuse,d diabetes mellitus type 2. Patient presented secondary to lethargy and near syncope, found to have significantly elevated blood pressure and CT evidence of acute intraparenchymal hemorrhage with intraventricular hemorrhage and hydrocephalus. Patient admitted to ICU for close blood pressure management and Neurosurgery consulted, performing a right frontal ventriculostomy. EVD eventually removed on 10/3.  PEG tube required on 10/16 and removed on 12/2.   Assessment and Plan:  Acute right thalamic intraparenchymal hemorrhage Intraventricular hemorrhage Obstructive hydrocephalus In setting of cocaine use. 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 Neurosurgery consulted while the patient was in the ED, with neurosurgery recommendation for no emergent placement of a ventricular drain. Patient intubated and transferred to the ICU on admission. Blood pressure management with Cleviprex . Repeat CT head significant for increase in size of medial right thalamic hematoma. Patient underwent right frontal ventriculostomy on 9/22 which was eventual clamped on 10/1 and removed on 10/3.   Acute metabolic encephalopathy Present on presentation and related to hydrocephalus and IVH. Waxing and waning.  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  -Continue Coreg , spironolactone  and Entresto   Acute respiratory failure with hypoxia Patient with worsening mental status and concern for inability to protect her airway on admission. Patient was intubated successfully on 9/22 and managed via mechanical ventilation. Patient extubated successfully on 9/23. On room  air.  Tachypnea Unclear etiology. Per patient, no dyspnea. Possibly related to aspiration in setting of dysphagia and tube feeds. Chest x-ray unremarkable. Patient remains with SpO2 of 100% on room air. Possibly anxiety related. VBG consistent with respiratory alkalosis. D-dimer negative. Symptoms resolved.  Primary hypertension Hypertensive emergency Hypertensive emergency in setting of stroke and severely elevated blood pressure. Patient managed on Cleviprex  drip while in ICU. Cleviprex  weaned off. -Continue amlodipine , Coreg , spironolactone  and Entresto   Diabetes mellitus type 2 Well controlled based on hemoglobin A1C of 6.6%. -Continue SSI  Cocaine abuse Noted. Patient with a history of cocaine use, most recently 3 hours prior to presentation. Cocaine positive on UDS.  Abnormal CT chest Imaging significant for right apical pleural scarring versus density. Recommendation for repeat CT scan in 3 months from 01/03/2024.  Dysphagia Moderate malnutrition Patient required PEG tube placement on 10/16 with initiation of tube feeds. Patient with improved oral intake and PEG tube removed on 12/2. -Dietitian recommendations (12/15): Continue current diet as ordered per SLP Encourage PO intake  Changed to ordering assistance Ensure Plus High Protein po TID, each supplement provides 350 kcal and 20 grams of protein MVI with minerals daily   DVT prophylaxis: Heparin  subq Code Status:   Code Status: Full Code Family Communication: None at bedside Disposition Plan: Discharge to SNF pending bed availability and increased nutrition intake   Consultants:  Neurology PCCM Neurosurgery  Procedures:  Right frontal ventriculostomy Cortrak placement Placement of percutaneous 62F balloon-retention gastrostomy tube Removal of intact  balloon retention G-tube  Antimicrobials: None    Subjective: No concerns this morning. Doing well.  Objective: BP (!) 145/90 (BP Location: Left Arm)    Pulse 74   Temp 98.2 F (36.8 C)   Resp 17   Ht 5' 5 (1.651 m)   Wt 55.9 kg   LMP 12/24/2015   SpO2 96%  BMI 20.49 kg/m   Examination:  General exam: Appears calm and comfortable. Respiratory system: Clear to auscultation. Respiratory effort normal. Cardiovascular system: S1 & S2 heard, RRR. Gastrointestinal system: Abdomen is nondistended, soft and nontender. Normal bowel sounds heard. Central nervous system: Alert and oriented. No focal neurological deficits. Musculoskeletal: No edema. No calf tenderness   Data Reviewed: I have personally reviewed following labs and imaging studies  CBC Lab Results  Component Value Date   WBC 6.1 03/25/2024   RBC 4.10 03/25/2024   HGB 11.9 (L) 03/25/2024   HCT 36.2 03/25/2024   MCV 88.3 03/25/2024   MCH 29.0 03/25/2024   PLT 279 03/25/2024   MCHC 32.9 03/25/2024   RDW 15.6 (H) 03/25/2024   LYMPHSABS 1.9 03/25/2024   MONOABS 0.5 03/25/2024   EOSABS 0.3 03/25/2024   BASOSABS 0.0 03/25/2024     Last metabolic panel Lab Results  Component Value Date   NA 140 03/25/2024   K 3.6 03/25/2024   CL 105 03/25/2024   CO2 25 03/25/2024   BUN 13 03/25/2024   CREATININE 0.81 03/25/2024   GLUCOSE 131 (H) 03/25/2024   GFRNONAA >60 03/25/2024   GFRAA >90 01/21/2014   CALCIUM 9.3 03/25/2024   PHOS 4.1 01/21/2024   PROT 6.7 03/15/2024   ALBUMIN 3.0 (L) 03/15/2024   LABGLOB 3.6 11/15/2023   BILITOT 0.6 03/15/2024   ALKPHOS 51 03/15/2024   AST 18 03/15/2024   ALT 27 03/15/2024   ANIONGAP 10 03/25/2024    GFR: Estimated Creatinine Clearance: 63.5 mL/min (by C-G formula based on SCr of 0.81 mg/dL).  No results found for this or any previous visit (from the past 240 hours).     Radiology Studies: No results found.     LOS: 89 days    Elgin Lam, MD [facility] 04/01/2024, 7:37 AM   If 7PM-7AM, please contact night-coverage [email or web address]  "

## 2024-04-02 DIAGNOSIS — I5042 Chronic combined systolic (congestive) and diastolic (congestive) heart failure: Secondary | ICD-10-CM | POA: Diagnosis not present

## 2024-04-02 DIAGNOSIS — I10 Essential (primary) hypertension: Secondary | ICD-10-CM | POA: Diagnosis not present

## 2024-04-02 DIAGNOSIS — E440 Moderate protein-calorie malnutrition: Secondary | ICD-10-CM | POA: Diagnosis not present

## 2024-04-02 NOTE — Progress Notes (Signed)
 "  PROGRESS NOTE    Amanda [name]  FMW:[id number] DOB: 18-Apr-1960 DOA: 01/03/2024 PCP: Amanda [name], No Pcp Per   Brief Narrative: Amanda [name] is a 63 y.o. female with a history of hypertension, heart failure, cocaine abuse,d diabetes mellitus type 2. Amanda [name] presented secondary to lethargy and near syncope, found to have significantly elevated blood pressure and CT evidence of acute intraparenchymal hemorrhage with intraventricular hemorrhage and hydrocephalus. Amanda [name] admitted to ICU for close blood pressure management and Neurosurgery consulted, performing a right frontal ventriculostomy. EVD eventually removed on 10/3.  PEG tube required on 10/16 and removed on 12/2.   Assessment and Plan:  Acute right thalamic intraparenchymal hemorrhage Intraventricular hemorrhage Obstructive hydrocephalus In setting of cocaine use. 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 Neurosurgery consulted while the Amanda [name] was in the ED, with neurosurgery recommendation for no emergent placement of a ventricular drain. Amanda [name] intubated and transferred to the ICU on admission. Blood pressure management with Cleviprex . Repeat CT head significant for increase in size of medial right thalamic hematoma. Amanda [name] underwent right frontal ventriculostomy on 9/22 which was eventual clamped on 10/1 and removed on 10/3.   Acute metabolic encephalopathy Present on presentation and related to hydrocephalus and IVH. Waxing and waning.  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  -Continue Coreg , spironolactone  and Entresto   Acute respiratory failure with hypoxia Amanda [name] with worsening mental status and concern for inability to protect her airway on admission. Amanda [name] was intubated successfully on 9/22 and managed via mechanical ventilation. Amanda [name] extubated successfully on 9/23. On room  air.  Tachypnea Unclear etiology. Per Amanda [name], no dyspnea. Possibly related to aspiration in setting of dysphagia and tube feeds. Chest x-ray unremarkable. Amanda [name] remains with SpO2 of 100% on room air. Possibly anxiety related. VBG consistent with respiratory alkalosis. D-dimer negative. Symptoms resolved.  Primary hypertension Hypertensive emergency Hypertensive emergency in setting of stroke and severely elevated blood pressure. Amanda [name] managed on Cleviprex  drip while in ICU. Cleviprex  weaned off. -Continue amlodipine , Coreg , spironolactone  and Entresto   Diabetes mellitus type 2 Well controlled based on hemoglobin A1C of 6.6%. -Continue SSI  Cocaine abuse Noted. Amanda [name] with a history of cocaine use, most recently 3 hours prior to presentation. Cocaine positive on UDS.  Abnormal CT chest Imaging significant for right apical pleural scarring versus density. Recommendation for repeat CT scan in 3 months from 01/03/2024.  Dysphagia Moderate malnutrition Amanda [name] required PEG tube placement on 10/16 with initiation of tube feeds. Amanda [name] with improved oral intake and PEG tube removed on 12/2. -Dietitian recommendations (12/15): Continue current diet as ordered per SLP Encourage PO intake  Changed to ordering assistance Ensure Plus High Protein po TID, each supplement provides 350 kcal and 20 grams of protein MVI with minerals daily   DVT prophylaxis: Heparin  subq Code Status:   Code Status: Full Code Family Communication: None at bedside Disposition Plan: Discharge to SNF pending bed availability and increased nutrition intake   Consultants:  Neurology PCCM Neurosurgery  Procedures:  Right frontal ventriculostomy Cortrak placement Placement of percutaneous 71F balloon-retention gastrostomy tube Removal of intact  balloon retention G-tube  Antimicrobials: None    Subjective: No concerns this morning. Watching Miquel Farrow, which she enjoys.  Objective: BP 113/85 (BP  Location: Left Arm)   Pulse 83   Temp 98.5 F (36.9 C) (Oral)   Resp 19   Ht 5' 5 (1.651 m)   Wt 55.9 kg   LMP 12/24/2015  SpO2 96%   BMI 20.49 kg/m   Examination:  General exam: Appears calm and comfortable. Respiratory system: Respiratory effort normal. Central nervous system: Alert and oriented.   Data Reviewed: I have personally reviewed following labs and imaging studies  CBC Lab Results  Component Value Date   WBC 6.1 03/25/2024   RBC 4.10 03/25/2024   HGB 11.9 (L) 03/25/2024   HCT 36.2 03/25/2024   MCV 88.3 03/25/2024   MCH 29.0 03/25/2024   PLT 279 03/25/2024   MCHC 32.9 03/25/2024   RDW 15.6 (H) 03/25/2024   LYMPHSABS 1.9 03/25/2024   MONOABS 0.5 03/25/2024   EOSABS 0.3 03/25/2024   BASOSABS 0.0 03/25/2024     Last metabolic panel Lab Results  Component Value Date   NA 140 03/25/2024   K 3.6 03/25/2024   CL 105 03/25/2024   CO2 25 03/25/2024   BUN 13 03/25/2024   CREATININE 0.81 03/25/2024   GLUCOSE 131 (H) 03/25/2024   GFRNONAA >60 03/25/2024   GFRAA >90 01/21/2014   CALCIUM 9.3 03/25/2024   PHOS 4.1 01/21/2024   PROT 6.7 03/15/2024   ALBUMIN 3.0 (L) 03/15/2024   LABGLOB 3.6 11/15/2023   BILITOT 0.6 03/15/2024   ALKPHOS 51 03/15/2024   AST 18 03/15/2024   ALT 27 03/15/2024   ANIONGAP 10 03/25/2024    GFR: Estimated Creatinine Clearance: 63.5 mL/min (by C-G formula based on SCr of 0.81 mg/dL).  No results found for this or any previous visit (from the past 240 hours).     Radiology Studies: No results found.     LOS: 90 days    Elgin Lam, MD [facility] 04/02/2024, 12:38 PM   If 7PM-7AM, please contact night-coverage [email or web address]  "

## 2024-04-02 NOTE — Plan of Care (Signed)
   Problem: Coping: Goal: Ability to adjust to condition or change in health will improve Outcome: Progressing

## 2024-04-02 NOTE — Plan of Care (Signed)
" °  Problem: Health Behavior/Discharge Planning: Goal: Ability to manage health-related needs will improve Outcome: Progressing   Problem: Metabolic: Goal: Ability to maintain appropriate glucose levels will improve Outcome: Progressing   Problem: Nutritional: Goal: Maintenance of adequate nutrition will improve Outcome: Progressing   Problem: Skin Integrity: Goal: Risk for impaired skin integrity will decrease Outcome: Progressing   Problem: Health Behavior/Discharge Planning: Goal: Ability to manage health-related needs will improve Outcome: Progressing   Problem: Nutrition: Goal: Adequate nutrition will be maintained Outcome: Progressing   Problem: Activity: Goal: Risk for activity intolerance will decrease Outcome: Progressing   Problem: Coping: Goal: Level of anxiety will decrease Outcome: Progressing   Problem: Safety: Goal: Ability to remain free from injury will improve Outcome: Progressing   "

## 2024-04-03 DIAGNOSIS — E440 Moderate protein-calorie malnutrition: Secondary | ICD-10-CM | POA: Diagnosis not present

## 2024-04-03 DIAGNOSIS — I10 Essential (primary) hypertension: Secondary | ICD-10-CM | POA: Diagnosis not present

## 2024-04-03 DIAGNOSIS — I5042 Chronic combined systolic (congestive) and diastolic (congestive) heart failure: Secondary | ICD-10-CM | POA: Diagnosis not present

## 2024-04-03 NOTE — Progress Notes (Signed)
 "  PROGRESS NOTE    Amanda [name]  FMW:[id number] DOB: 27-Oct-1960 DOA: 01/03/2024 PCP: Patient, No Pcp Per   Brief Narrative: Amanda [name] is a 63 y.o. female with a history of hypertension, heart failure, cocaine abuse,d diabetes mellitus type 2. Patient presented secondary to lethargy and near syncope, found to have significantly elevated blood pressure and CT evidence of acute intraparenchymal hemorrhage with intraventricular hemorrhage and hydrocephalus. Patient admitted to ICU for close blood pressure management and Neurosurgery consulted, performing a right frontal ventriculostomy. EVD eventually removed on 10/3.  PEG tube required on 10/16 and removed on 12/2.   Assessment and Plan:  Acute right thalamic intraparenchymal hemorrhage Intraventricular hemorrhage Obstructive hydrocephalus In setting of cocaine use. 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 Neurosurgery consulted while the patient was in the ED, with neurosurgery recommendation for no emergent placement of a ventricular drain. Patient intubated and transferred to the ICU on admission. Blood pressure management with Cleviprex . Repeat CT head significant for increase in size of medial right thalamic hematoma. Patient underwent right frontal ventriculostomy on 9/22 which was eventual clamped on 10/1 and removed on 10/3.   Acute metabolic encephalopathy Present on presentation and related to hydrocephalus and IVH. Waxing and waning.  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  -Continue Coreg , spironolactone  and Entresto   Acute respiratory failure with hypoxia Patient with worsening mental status and concern for inability to protect her airway on admission. Patient was intubated successfully on 9/22 and managed via mechanical ventilation. Patient extubated successfully on 9/23. On room  air.  Tachypnea Unclear etiology. Per patient, no dyspnea. Possibly related to aspiration in setting of dysphagia and tube feeds. Chest x-ray unremarkable. Patient remains with SpO2 of 100% on room air. Possibly anxiety related. VBG consistent with respiratory alkalosis. D-dimer negative. Symptoms resolved.  Primary hypertension Hypertensive emergency Hypertensive emergency in setting of stroke and severely elevated blood pressure. Patient managed on Cleviprex  drip while in ICU. Cleviprex  weaned off. -Continue amlodipine , Coreg , spironolactone  and Entresto   Diabetes mellitus type 2 Well controlled based on hemoglobin A1C of 6.6%. -Continue SSI  Cocaine abuse Noted. Patient with a history of cocaine use, most recently 3 hours prior to presentation. Cocaine positive on UDS.  Abnormal CT chest Imaging significant for right apical pleural scarring versus density. Recommendation for repeat CT scan in 3 months from 01/03/2024.  Dysphagia Moderate malnutrition Patient required PEG tube placement on 10/16 with initiation of tube feeds. Patient with improved oral intake and PEG tube removed on 12/2. -Dietitian recommendations (12/15): Continue current diet as ordered per SLP Encourage PO intake  Changed to ordering assistance Ensure Plus High Protein po TID, each supplement provides 350 kcal and 20 grams of protein MVI with minerals daily   DVT prophylaxis: Heparin  subq Code Status:   Code Status: Full Code Family Communication: None at bedside Disposition Plan: Discharge to SNF pending bed availability   Consultants:  Neurology PCCM Neurosurgery  Procedures:  Right frontal ventriculostomy Cortrak placement Placement of percutaneous 62F balloon-retention gastrostomy tube Removal of intact  balloon retention G-tube  Antimicrobials: None    Subjective: Patient curious about if she will be able to go home.  Objective: BP (!) 140/86 (BP Location: Right Arm)   Pulse 76    Temp 98.2 F (36.8 C) (Oral)   Resp 16   Ht 5' 5 (1.651 m)   Wt 55.1 kg   LMP 12/24/2015   SpO2 96%  BMI 20.21 kg/m   Examination:  General exam: Appears calm and comfortable. Greets me by name as I walked into the room. Respiratory system: Respiratory effort normal. Central nervous system: Alert and oriented.   Data Reviewed: I have personally reviewed following labs and imaging studies  CBC Lab Results  Component Value Date   WBC 6.1 03/25/2024   RBC 4.10 03/25/2024   HGB 11.9 (L) 03/25/2024   HCT 36.2 03/25/2024   MCV 88.3 03/25/2024   MCH 29.0 03/25/2024   PLT 279 03/25/2024   MCHC 32.9 03/25/2024   RDW 15.6 (H) 03/25/2024   LYMPHSABS 1.9 03/25/2024   MONOABS 0.5 03/25/2024   EOSABS 0.3 03/25/2024   BASOSABS 0.0 03/25/2024     Last metabolic panel Lab Results  Component Value Date   NA 140 03/25/2024   K 3.6 03/25/2024   CL 105 03/25/2024   CO2 25 03/25/2024   BUN 13 03/25/2024   CREATININE 0.81 03/25/2024   GLUCOSE 131 (H) 03/25/2024   GFRNONAA >60 03/25/2024   GFRAA >90 01/21/2014   CALCIUM 9.3 03/25/2024   PHOS 4.1 01/21/2024   PROT 6.7 03/15/2024   ALBUMIN 3.0 (L) 03/15/2024   LABGLOB 3.6 11/15/2023   BILITOT 0.6 03/15/2024   ALKPHOS 51 03/15/2024   AST 18 03/15/2024   ALT 27 03/15/2024   ANIONGAP 10 03/25/2024    GFR: Estimated Creatinine Clearance: 62.6 mL/min (by C-G formula based on SCr of 0.81 mg/dL).  No results found for this or any previous visit (from the past 240 hours).     Radiology Studies: No results found.     LOS: 91 days    Elgin Lam, MD [facility] 04/03/2024, 10:46 AM   If 7PM-7AM, please contact night-coverage [email or web address]  "

## 2024-04-03 NOTE — Progress Notes (Signed)
 Occupational Therapy Treatment Patient Details Name: Amanda [name] MRN: [id number] DOB: 03/09/1961 Today's Date: 04/03/2024   History of present illness 63 yo female presents [facility] on 9/22 with lethargy and near-syncope s/p cocaine and opioid ingestion. 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 S/p R frontal ventriculostomy, burr holes 9/22. ETT 9/22-9/23. S/p EVD removal 10/3. PEG tube removed 12/2 following improved p.o. intake. PMHx of cocaine abuse, CHF, CKD, chronic cough, HTN and DM2.   OT comments  Patient continues to demonstrate gains with bed mobility, transfers, and self care. Patient requires frequent cues for safety due to impulsivity and cues for safety. Patient will benefit from continued inpatient follow up therapy, <3 hours/day. Acute OT to continue to follow to address established goals to facilitate DC to next venue of care.        If plan is discharge home, recommend the following:  Assistance with cooking/housework;Help with stairs or ramp for entrance;Direct supervision/assist for financial management;Direct supervision/assist for medications management;Assist for transportation;Supervision due to cognitive status;A little help with walking and/or transfers;A little help with bathing/dressing/bathroom   Equipment Recommendations  Other (comment) (defer)    Recommendations for Other Services      Precautions / Restrictions Precautions Precautions: Fall Recall of Precautions/Restrictions: Impaired Precaution/Restrictions Comments: SBP < 160, impulsive Restrictions Weight Bearing Restrictions Per Provider Order: No       Mobility Bed Mobility Overal bed mobility: Needs Assistance Bed Mobility: Sit to Supine       Sit to supine: Supervision, HOB elevated   General bed mobility comments: in recliner at beginning of session and supervision to get to supine     Transfers Overall transfer level: Needs assistance Equipment used: Rolling walker (2 wheels) Transfers: Sit to/from Stand Sit to Stand: Contact guard assist           General transfer comment: CGA to supervision for safety     Balance Overall balance assessment: Needs assistance Sitting-balance support: No upper extremity supported, Feet supported Sitting balance-Leahy Scale: Fair Sitting balance - Comments: EOB   Standing balance support: During functional activity, No upper extremity supported Standing balance-Leahy Scale: Fair Standing balance comment: able to stand at sink without UE support, reliant on RW for dynamic standing and mobility                           ADL either performed or assessed with clinical judgement   ADL Overall ADL's : Needs assistance/impaired     Grooming: Wash/dry hands;Wash/dry face;Supervision/safety;Cueing for sequencing;Standing Grooming Details (indicate cue type and reason): at sink             Lower Body Dressing: Supervision/safety;Sitting/lateral leans Lower Body Dressing Details (indicate cue type and reason): socks Toilet Transfer: Contact guard assist;Ambulation;Regular Toilet;Rolling walker (2 wheels) Toilet Transfer Details (indicate cue type and reason): cues for safety                Extremity/Trunk Assessment              Vision       Perception     Praxis     Communication Communication Communication: No apparent difficulties   Cognition Arousal: Alert Behavior During Therapy: Impulsive Cognition: Cognition impaired   Orientation impairments: Time, Situation Awareness: Intellectual awareness impaired Memory impairment (select all impairments): Short-term memory, Working memory Attention impairment (select first level of impairment): Sustained attention Executive functioning impairment (select all impairments):  Initiation, Sequencing, Reasoning, Problem solving                    Following commands: Impaired Following commands impaired: Follows one step commands inconsistently      Cueing   Cueing Techniques: Verbal cues, Gestural cues  Exercises      Shoulder Instructions       General Comments VSS on RA    Pertinent Vitals/ Pain       Pain Assessment Pain Assessment: Faces Faces Pain Scale: Hurts a little bit Pain Location: back Pain Descriptors / Indicators: Aching, Grimacing Pain Intervention(s): Limited activity within patient's tolerance, Monitored during session, Repositioned  Home Living                                          Prior Functioning/Environment              Frequency  Min 2X/week        Progress Toward Goals  OT Goals(current goals can now be found in the care plan section)  Progress towards OT goals: Progressing toward goals     Plan      Co-evaluation                 AM-PAC OT 6 Clicks Daily Activity     Outcome Measure   Help from another person eating meals?: None Help from another person taking care of personal grooming?: A Little Help from another person toileting, which includes using toliet, bedpan, or urinal?: A Little Help from another person bathing (including washing, rinsing, drying)?: A Little Help from another person to put on and taking off regular upper body clothing?: A Little Help from another person to put on and taking off regular lower body clothing?: A Little 6 Click Score: 19    End of Session Equipment Utilized During Treatment: Gait belt;Rolling walker (2 wheels)  OT Visit Diagnosis: Unsteadiness on feet (R26.81);Other abnormalities of gait and mobility (R26.89);Muscle weakness (generalized) (M62.81)   Activity Tolerance Patient tolerated treatment well   Patient Left in bed;with call bell/phone within reach;with bed alarm set   Nurse Communication Mobility status        Time: 1212-1228 OT Time Calculation (min): 16 min  Charges: OT  General Charges $OT Visit: 1 Visit OT Treatments $Self Care/Home Management : 8-22 mins  Dick Laine, OTA Acute Rehabilitation Services  Office [phone]   Jeb LITTIE Laine 04/03/2024, 2:18 PM

## 2024-04-03 NOTE — Plan of Care (Signed)
  Problem: Nutritional: Goal: Maintenance of adequate nutrition will improve Outcome: Progressing   Problem: Skin Integrity: Goal: Risk for impaired skin integrity will decrease Outcome: Progressing   Problem: Activity: Goal: Risk for activity intolerance will decrease Outcome: Progressing   Problem: Nutrition: Goal: Adequate nutrition will be maintained Outcome: Progressing   

## 2024-04-04 ENCOUNTER — Ambulatory Visit (HOSPITAL_COMMUNITY)

## 2024-04-04 DIAGNOSIS — I5042 Chronic combined systolic (congestive) and diastolic (congestive) heart failure: Secondary | ICD-10-CM | POA: Diagnosis not present

## 2024-04-04 DIAGNOSIS — E440 Moderate protein-calorie malnutrition: Secondary | ICD-10-CM | POA: Diagnosis not present

## 2024-04-04 DIAGNOSIS — I10 Essential (primary) hypertension: Secondary | ICD-10-CM | POA: Diagnosis not present

## 2024-04-04 NOTE — Plan of Care (Signed)
 Problem: Education: Goal: Ability to describe self-care measures that may prevent or decrease complications (Diabetes Survival Skills Education) will improve Outcome: Progressing   Problem: Coping: Goal: Ability to adjust to condition or change in health will improve Outcome: Progressing   Problem: Fluid Volume: Goal: Ability to maintain a balanced intake and output will improve Outcome: Progressing   Problem: Health Behavior/Discharge Planning: Goal: Ability to identify and utilize available resources and services will improve Outcome: Progressing Goal: Ability to manage health-related needs will improve Outcome: Progressing   Problem: Metabolic: Goal: Ability to maintain appropriate glucose levels will improve Outcome: Progressing   Problem: Nutritional: Goal: Maintenance of adequate nutrition will improve Outcome: Progressing Goal: Progress toward achieving an optimal weight will improve Outcome: Progressing   Problem: Skin Integrity: Goal: Risk for impaired skin integrity will decrease Outcome: Progressing   Problem: Tissue Perfusion: Goal: Adequacy of tissue perfusion will improve Outcome: Progressing   Problem: Education: Goal: Knowledge of General Education information will improve Description: Including pain rating scale, medication(s)/side effects and non-pharmacologic comfort measures Outcome: Progressing   Problem: Health Behavior/Discharge Planning: Goal: Ability to manage health-related needs will improve Outcome: Progressing   Problem: Clinical Measurements: Goal: Ability to maintain clinical measurements within normal limits will improve Outcome: Progressing Goal: Will remain free from infection Outcome: Progressing Goal: Diagnostic test results will improve Outcome: Progressing Goal: Respiratory complications will improve Outcome: Progressing Goal: Cardiovascular complication will be avoided Outcome: Progressing   Problem: Activity: Goal:  Risk for activity intolerance will decrease Outcome: Progressing   Problem: Nutrition: Goal: Adequate nutrition will be maintained Outcome: Progressing   Problem: Coping: Goal: Level of anxiety will decrease Outcome: Progressing   Problem: Elimination: Goal: Will not experience complications related to bowel motility Outcome: Progressing Goal: Will not experience complications related to urinary retention Outcome: Progressing   Problem: Pain Managment: Goal: General experience of comfort will improve and/or be controlled Outcome: Progressing   Problem: Safety: Goal: Ability to remain free from injury will improve Outcome: Progressing   Problem: Skin Integrity: Goal: Risk for impaired skin integrity will decrease Outcome: Progressing   Problem: Education: Goal: Knowledge of disease or condition will improve Outcome: Progressing Goal: Knowledge of secondary prevention will improve (MUST DOCUMENT ALL) Outcome: Progressing Goal: Knowledge of patient specific risk factors will improve (DELETE if not current risk factor) Outcome: Progressing   Problem: Intracerebral Hemorrhage Tissue Perfusion: Goal: Complications of Intracerebral Hemorrhage will be minimized Outcome: Progressing   Problem: Coping: Goal: Will verbalize positive feelings about self Outcome: Progressing Goal: Will identify appropriate support needs Outcome: Progressing   Problem: Health Behavior/Discharge Planning: Goal: Ability to manage health-related needs will improve Outcome: Progressing Goal: Goals will be collaboratively established with patient/family Outcome: Progressing   Problem: Self-Care: Goal: Ability to participate in self-care as condition permits will improve Outcome: Progressing Goal: Verbalization of feelings and concerns over difficulty with self-care will improve Outcome: Progressing Goal: Ability to communicate needs accurately will improve Outcome: Progressing   Problem:  Nutrition: Goal: Risk of aspiration will decrease Outcome: Progressing Goal: Dietary intake will improve Outcome: Progressing   Problem: Education: Goal: Knowledge of disease or condition will improve Outcome: Progressing Goal: Knowledge of secondary prevention will improve (MUST DOCUMENT ALL) Outcome: Progressing Goal: Knowledge of patient specific risk factors will improve (DELETE if not current risk factor) Outcome: Progressing   Problem: Intracerebral Hemorrhage Tissue Perfusion: Goal: Complications of Intracerebral Hemorrhage will be minimized Outcome: Progressing   Problem: Coping: Goal: Will verbalize positive feelings about self Outcome: Progressing Goal: Will  identify appropriate support needs Outcome: Progressing   Problem: Health Behavior/Discharge Planning: Goal: Ability to manage health-related needs will improve Outcome: Progressing Goal: Goals will be collaboratively established with patient/family Outcome: Progressing   Problem: Self-Care: Goal: Ability to participate in self-care as condition permits will improve Outcome: Progressing Goal: Verbalization of feelings and concerns over difficulty with self-care will improve Outcome: Progressing Goal: Ability to communicate needs accurately will improve Outcome: Progressing

## 2024-04-04 NOTE — Progress Notes (Incomplete)
" ° °  ADVANCED HEART FAILURE NEW PATIENT CLINIC NOTE  Referring Physician: No ref. provider found  Primary Care: Patient, No Pcp Per Primary Cardiologist: Dr. Zenaida   HPI: Amanda [name] is a 63 y.o. female with a PMH of hypertension, chronic heart failure with reduced ejection fraction and CKD  Patient has a past medical history of hypertension and prior cocaine use, has been admitted in the past for hypertensive urgency.  Her EF recently returned at 30% so she was sent to HF clinic for further evaluation.   Seen by Dr. Zenaida 8/25. Given her echo was extremely concerning for cardiac amyloid with classic cherry on top strain pattern, she was ordered to complete a PYP scan. Plan was to proceed w/ LHC if study was negative for amyloid. Unfortunately, pt was admitted 9/26 w/ hemorrhagic stroke, thus cardiac w/u has not yet been completed.     SUBJECTIVE:  ***  PMH, current medications, allergies, social history, and family history reviewed in epic.  PHYSICAL EXAM: There were no vitals filed for this visit.  GENERAL: Well nourished and in no apparent distress at rest.  PULM:  Normal work of breathing, clear to auscultation bilaterally. Respirations are unlabored.  CARDIAC:  JVP: mildly elevated         Normal rate with regular rhythm. No murmurs, rubs or gallops.  Trace edema. Warm and well perfused extremities. ABDOMEN: Soft, non-tender, non-distended. NEUROLOGIC: Patient is oriented x3 with no focal or lateralizing neurologic deficits.    DATA REVIEW  ECG: 04/2023: Normal sinus rhythm, biatrial enlargement  ECHO: 05/12/2023: LVEF 30%, grade 3 diastolic dysfunction, mildly reduced RV systolic function, estimated RVSP of 50, with strain pattern consistent with cardiac amyloid  CATH: None   ASSESSMENT & PLAN:  Chronic heart failure with reduced EF: Presumed NICM, but no ischemic workup done. Her echo is extremely concerning for cardiac amyloid with classic cherry on top  strain pattern. Will plan for PYP scan, if negative will need LHC. - PYP - TTR genetic testing - AL amyloid labs - Continue carvedilol  3.125 mg twice daily, increase Entresto  to 49/51 mg twice daily, continue Jardiance  10 mg daily - Continue spironolactone  25 mg daily - Euvolemic, continue Lasix  20 mg as needed - Stable NYHA class II  Hypertension: Likely driving at least part of her cardiomyopathy. - Increase in entresto  as above, stressed importance of med compliance  Polysubstance use: Stressed importance of cessation.  Elevated Hgb:  - Sleep study needed  Follow up in 2-3 months  Morene Zenaida, MD Advanced Heart Failure Mechanical Circulatory Support 04/04/2024 "

## 2024-04-04 NOTE — Plan of Care (Signed)
" °  Problem: Health Behavior/Discharge Planning: Goal: Ability to identify and utilize available resources and services will improve Outcome: Progressing Goal: Ability to manage health-related needs will improve Outcome: Progressing   Problem: Nutritional: Goal: Maintenance of adequate nutrition will improve Outcome: Progressing Goal: Progress toward achieving an optimal weight will improve Outcome: Progressing   Problem: Skin Integrity: Goal: Risk for impaired skin integrity will decrease Outcome: Progressing   Problem: Clinical Measurements: Goal: Ability to maintain clinical measurements within normal limits will improve Outcome: Progressing Goal: Will remain free from infection Outcome: Progressing Goal: Diagnostic test results will improve Outcome: Progressing Goal: Respiratory complications will improve Outcome: Progressing Goal: Cardiovascular complication will be avoided Outcome: Progressing   Problem: Activity: Goal: Risk for activity intolerance will decrease Outcome: Progressing   Problem: Coping: Goal: Level of anxiety will decrease Outcome: Progressing   Problem: Elimination: Goal: Will not experience complications related to bowel motility Outcome: Progressing Goal: Will not experience complications related to urinary retention Outcome: Progressing   Problem: Pain Managment: Goal: General experience of comfort will improve and/or be controlled Outcome: Progressing   Problem: Safety: Goal: Ability to remain free from injury will improve Outcome: Progressing   "

## 2024-04-04 NOTE — Progress Notes (Signed)
 PT Cancellation Note  Patient Details Name: Amanda [name] MRN: [id number] DOB: Aug 11, 1960   Cancelled Treatment:    Reason Eval/Treat Not Completed: Other (comment)  Patient politely asked PT to come back later. She is in the midst of texting someone re: Christmas plans and wants to finish this first. Will return as schedule permits.    Macario RAMAN, PT Acute Rehabilitation Services  Office [phone]   Macario SHAUNNA Soja 04/04/2024, 11:39 AM

## 2024-04-04 NOTE — Progress Notes (Signed)
 "  PROGRESS NOTE    Amanda [name]  FMW:[id number] DOB: 05-14-1960 DOA: 01/03/2024 PCP: Patient, No Pcp Per   Brief Narrative: Amanda [name] is a 63 y.o. female with a history of hypertension, heart failure, cocaine abuse,d diabetes mellitus type 2. Patient presented secondary to lethargy and near syncope, found to have significantly elevated blood pressure and CT evidence of acute intraparenchymal hemorrhage with intraventricular hemorrhage and hydrocephalus. Patient admitted to ICU for close blood pressure management and Neurosurgery consulted, performing a right frontal ventriculostomy. EVD eventually removed on 10/3.  PEG tube required on 10/16 and removed on 12/2.   Assessment and Plan:  Acute right thalamic intraparenchymal hemorrhage Intraventricular hemorrhage Obstructive hydrocephalus In setting of cocaine use. 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 Neurosurgery consulted while the patient was in the ED, with neurosurgery recommendation for no emergent placement of a ventricular drain. Patient intubated and transferred to the ICU on admission. Blood pressure management with Cleviprex . Repeat CT head significant for increase in size of medial right thalamic hematoma. Patient underwent right frontal ventriculostomy on 9/22 which was eventual clamped on 10/1 and removed on 10/3.   Acute metabolic encephalopathy Present on presentation and related to hydrocephalus and IVH. Waxing and waning.  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  -Continue Coreg , spironolactone  and Entresto   Acute respiratory failure with hypoxia Patient with worsening mental status and concern for inability to protect her airway on admission. Patient was intubated successfully on 9/22 and managed via mechanical ventilation. Patient extubated successfully on 9/23. On room  air.  Tachypnea Unclear etiology. Per patient, no dyspnea. Possibly related to aspiration in setting of dysphagia and tube feeds. Chest x-ray unremarkable. Patient remains with SpO2 of 100% on room air. Possibly anxiety related. VBG consistent with respiratory alkalosis. D-dimer negative. Symptoms resolved.  Primary hypertension Hypertensive emergency Hypertensive emergency in setting of stroke and severely elevated blood pressure. Patient managed on Cleviprex  drip while in ICU. Cleviprex  weaned off. -Continue amlodipine , Coreg , spironolactone  and Entresto   Diabetes mellitus type 2 Well controlled based on hemoglobin A1C of 6.6%. -Continue SSI  Cocaine abuse Noted. Patient with a history of cocaine use, most recently 3 hours prior to presentation. Cocaine positive on UDS.  Abnormal CT chest Imaging significant for right apical pleural scarring versus density. Recommendation for repeat CT scan in 3 months from 01/03/2024.  Dysphagia Moderate malnutrition Patient required PEG tube placement on 10/16 with initiation of tube feeds. Patient with improved oral intake and PEG tube removed on 12/2. -Dietitian recommendations (12/15): Continue current diet as ordered per SLP Encourage PO intake  Changed to ordering assistance Ensure Plus High Protein po TID, each supplement provides 350 kcal and 20 grams of protein MVI with minerals daily   DVT prophylaxis: Heparin  subq Code Status:   Code Status: Full Code Family Communication: None at bedside Disposition Plan: Discharge to SNF pending bed availability   Consultants:  Neurology PCCM Neurosurgery  Procedures:  Right frontal ventriculostomy Cortrak placement Placement of percutaneous 27F balloon-retention gastrostomy tube Removal of intact  balloon retention G-tube  Antimicrobials: None    Subjective: No specific concerns this morning.  Objective: BP (!) 158/97 (BP Location: Left Arm)   Pulse 74   Temp 97.6 F (36.4 C)  (Oral)   Resp 14   Ht 5' 5 (1.651 m)   Wt 55.1 kg   LMP 12/24/2015   SpO2 93%   BMI 20.21 kg/m  Examination:  General exam: Appears calm and comfortable. Greets me by name as I walked into the room. Respiratory system: Respiratory effort normal. Central nervous system: Alert and oriented.   Data Reviewed: I have personally reviewed following labs and imaging studies  CBC Lab Results  Component Value Date   WBC 6.1 03/25/2024   RBC 4.10 03/25/2024   HGB 11.9 (L) 03/25/2024   HCT 36.2 03/25/2024   MCV 88.3 03/25/2024   MCH 29.0 03/25/2024   PLT 279 03/25/2024   MCHC 32.9 03/25/2024   RDW 15.6 (H) 03/25/2024   LYMPHSABS 1.9 03/25/2024   MONOABS 0.5 03/25/2024   EOSABS 0.3 03/25/2024   BASOSABS 0.0 03/25/2024     Last metabolic panel Lab Results  Component Value Date   NA 140 03/25/2024   K 3.6 03/25/2024   CL 105 03/25/2024   CO2 25 03/25/2024   BUN 13 03/25/2024   CREATININE 0.81 03/25/2024   GLUCOSE 131 (H) 03/25/2024   GFRNONAA >60 03/25/2024   GFRAA >90 01/21/2014   CALCIUM 9.3 03/25/2024   PHOS 4.1 01/21/2024   PROT 6.7 03/15/2024   ALBUMIN 3.0 (L) 03/15/2024   LABGLOB 3.6 11/15/2023   BILITOT 0.6 03/15/2024   ALKPHOS 51 03/15/2024   AST 18 03/15/2024   ALT 27 03/15/2024   ANIONGAP 10 03/25/2024    GFR: Estimated Creatinine Clearance: 62.6 mL/min (by C-G formula based on SCr of 0.81 mg/dL).  No results found for this or any previous visit (from the past 240 hours).     Radiology Studies: No results found.     LOS: 92 days    Elgin Lam, MD [facility] 04/04/2024, 12:16 PM   If 7PM-7AM, please contact night-coverage [email or web address]  "

## 2024-04-05 DIAGNOSIS — I10 Essential (primary) hypertension: Secondary | ICD-10-CM | POA: Diagnosis not present

## 2024-04-05 DIAGNOSIS — I610 Nontraumatic intracerebral hemorrhage in hemisphere, subcortical: Secondary | ICD-10-CM | POA: Diagnosis not present

## 2024-04-05 DIAGNOSIS — E440 Moderate protein-calorie malnutrition: Secondary | ICD-10-CM | POA: Diagnosis not present

## 2024-04-05 DIAGNOSIS — F1011 Alcohol abuse, in remission: Secondary | ICD-10-CM | POA: Diagnosis not present

## 2024-04-05 NOTE — Progress Notes (Signed)
 Physical Therapy Treatment and Discharge Patient Details Name: Amanda [name] MRN: [id number] DOB: 20-Mar-1961 Today's Date: 04/05/2024   History of Present Illness 63 yo female presents to [facility] on 9/22 with lethargy and near-syncope s/p cocaine and opioid ingestion. 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 S/p R frontal ventriculostomy, burr holes 9/22. ETT 9/22-9/23. S/p EVD removal 10/3. PEG tube removed 12/2 following improved p.o. intake. PMHx of cocaine abuse, CHF, CKD, chronic cough, HTN and DM2.    PT Comments  Patient agreed to OOB and ambulation with RW. Continues with same gait deficits and does not carryover corrections as provided by PT. Unsafe instance in room where she lifted RW>6 off the floor to try to clear the leg of the computer base. Attempted to cue her for putting RW down and turning sideways in the narrow space and she became slightly agitated let go of me and proceeded with her plan to get around computer. Patient has not shown the ability to learn/carryover safety instructions related to mobility and use of RW. Discussed with TOC and patient is being discharged from PT.     If plan is discharge home, recommend the following: Supervision due to cognitive status;Assist for transportation;Help with stairs or ramp for entrance;Assistance with cooking/housework;Direct supervision/assist for medications management;Direct supervision/assist for financial management;A little help with walking and/or transfers;A little help with bathing/dressing/bathroom   Can travel by private vehicle     Yes  Equipment Recommendations  Rolling walker (2 wheels);BSC/3in1    Recommendations for Other Services       Precautions / Restrictions Precautions Precautions: Fall Recall of Precautions/Restrictions: Impaired Precaution/Restrictions Comments: SBP < 160, impulsive Restrictions Weight  Bearing Restrictions Per Provider Order: No     Mobility  Bed Mobility Overal bed mobility: Needs Assistance Bed Mobility: Sit to Supine, Supine to Sit     Supine to sit: Supervision Sit to supine: Supervision, HOB elevated   General bed mobility comments: initially trying to scoot out end of bed while rail up; supervision for safety due to h/o falls while hospitalized    Transfers Overall transfer level: Needs assistance Equipment used: Rolling walker (2 wheels) Transfers: Sit to/from Stand Sit to Stand: Contact guard assist           General transfer comment: CGA to supervision for safety; repeated cues for safe hand placement with RW    Ambulation/Gait Ambulation/Gait assistance: Contact guard assist, Min assist Gait Distance (Feet): 200 Feet Assistive device: Rolling walker (2 wheels), None Gait Pattern/deviations: Step-through pattern, Decreased stride length, Trunk flexed, Drifts right/left Gait velocity: Dec     General Gait Details: pushes RW too far ahead and will briefly correct with cues and then reverts to prior posture; pt wanted to walk over to her windows and lifting RW to clear the computer base very unsafe (tried to cue to turn sideways with RW to get through narrow space and pt would not respond to cues/assist)   Stairs             Wheelchair Mobility     Tilt Bed    Modified Rankin (Stroke Patients Only) Modified Rankin (Stroke Patients Only) Pre-Morbid Rankin Score: No symptoms Modified Rankin: Moderately severe disability     Balance Overall balance assessment: Needs assistance Sitting-balance support: No upper extremity supported, Feet supported Sitting balance-Leahy Scale: Fair Sitting balance - Comments: EOB   Standing balance support: During functional activity, No upper extremity supported Standing balance-Leahy Scale: Fair  Standing balance comment: reliant on RW for dynamic standing and mobility                             Communication Communication Communication: Impaired Factors Affecting Communication: Reduced clarity of speech (hypophonic at times)  Cognition Arousal: Alert Behavior During Therapy: Impulsive   PT - Cognitive impairments: Safety/Judgement, Problem solving, Attention, Awareness                       PT - Cognition Comments: Pt easily distracted requiring frequent redirection and cues for attention. Following commands: Impaired Following commands impaired: Follows one step commands inconsistently    Cueing Cueing Techniques: Verbal cues, Gestural cues  Exercises      General Comments        Pertinent Vitals/Pain Pain Assessment Pain Assessment: No/denies pain    Home Living                          Prior Function            PT Goals (current goals can now be found in the care plan section) Acute Rehab PT Goals Patient Stated Goal: to go home Progress towards PT goals: Not progressing toward goals - comment    Frequency           PT Plan      Co-evaluation              AM-PAC PT 6 Clicks Mobility   Outcome Measure  Help needed turning from your back to your side while in a flat bed without using bedrails?: A Little Help needed moving from lying on your back to sitting on the side of a flat bed without using bedrails?: A Little Help needed moving to and from a bed to a chair (including a wheelchair)?: A Little Help needed standing up from a chair using your arms (e.g., wheelchair or bedside chair)?: A Little Help needed to walk in hospital room?: A Little Help needed climbing 3-5 steps with a railing? : A Little 6 Click Score: 18    End of Session   Activity Tolerance: Patient tolerated treatment well Patient left: with call bell/phone within reach;in bed;with bed alarm set (pt refused chair) Nurse Communication: Mobility status;Other (comment) (discharge from PT) PT Visit Diagnosis: Unsteadiness on feet  (R26.81);Difficulty in walking, not elsewhere classified (R26.2);Other abnormalities of gait and mobility (R26.89);Other symptoms and signs involving the nervous system (R29.898);Muscle weakness (generalized) (M62.81)   Patient is being discharged from PT services secondary to:    Patient has made no progress toward goals in a reasonable time frame.  Please see latest Therapy Progress Note for current level of functioning and progress toward goals.  Progress and discharge plan and discussed with patient/caregiver and they  Patient unable to participate in discharge planning    Time: 0922-0940 PT Time Calculation (min) (ACUTE ONLY): 18 min  Charges:    $Gait Training: 8-22 mins PT General Charges $$ ACUTE PT VISIT: 1 Visit                      Macario RAMAN, PT Acute Rehabilitation Services  Office [phone]    Macario SHAUNNA Soja 04/05/2024, 9:49 AM

## 2024-04-05 NOTE — Progress Notes (Signed)
 "        [facility]                                                                               Amanda [name], is a 63 y.o. female, DOB - Feb 18, 1961, FMW:[id number] Admit date - 01/03/2024    Outpatient Primary MD for the Amanda [name] is Amanda [name], No Pcp Per  LOS - 93  days    Brief summary   Amanda [name] is a 63 y.o. female with a history of hypertension, heart failure, cocaine abuse,d diabetes mellitus type 2. Amanda [name] presented secondary to lethargy and near syncope, found to have significantly elevated blood pressure and CT evidence of acute intraparenchymal hemorrhage with intraventricular hemorrhage and hydrocephalus. Amanda [name] admitted to ICU for close blood pressure management and Neurosurgery consulted, performing a right frontal ventriculostomy. EVD eventually removed on 10/3.  PEG tube required on 10/16 and removed on 12/2.   Assessment & Plan    Assessment and Plan:   Acute right thalamic intraparenchymal hemorrhage Intraventricular hemorrhage Obstructive hydrocephalus In setting of cocaine use. 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 Neurosurgery consulted while the Amanda [name] was in the ED, with neurosurgery recommendation for no emergent placement of a ventricular drain. Amanda [name] intubated and transferred to the ICU on admission. Blood pressure management with Cleviprex . Repeat CT head significant for increase in size of medial right thalamic hematoma. Amanda [name] underwent right frontal ventriculostomy on 9/22 which was eventual clamped on 10/1 and removed on 10/3.   No new events.    Acute metabolic encephalopathy Present on presentation and related to hydrocephalus and IVH. Waxing and waning.   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  -Continue Coreg , spironolactone  and Entresto    Acute respiratory failure with hypoxia Amanda [name] with worsening  mental status and concern for inability to protect her airway on admission. Amanda [name] was intubated successfully on 9/22 and managed via mechanical ventilation. Amanda [name] extubated successfully on 9/23. On room air.   Tachypnea Unclear etiology. Per Amanda [name], no dyspnea. Possibly related to aspiration in setting of dysphagia and tube feeds. Chest x-ray unremarkable. Amanda [name] remains with SpO2 of 100% on room air. Possibly anxiety related. VBG consistent with respiratory alkalosis. D-dimer negative. Symptoms resolved.   Primary hypertension Hypertensive emergency Hypertensive emergency in setting of stroke and severely elevated blood pressure. Amanda [name] managed on Cleviprex  drip while in ICU. Cleviprex  weaned off. -Continue amlodipine , Coreg , spironolactone  and Entresto . - no change in meds.    Diabetes mellitus type 2 Well controlled based on hemoglobin A1C of 6.6%. -Continue SSI - no changes in meds.    Cocaine abuse Noted. Amanda [name] with a history of cocaine use, most recently 3 hours prior to presentation. Cocaine positive on UDS.   Abnormal CT chest Imaging significant for right apical pleural scarring versus density. Recommendation for repeat CT scan in 3 months from 01/03/2024.   Dysphagia Moderate malnutrition Amanda [name] required PEG tube placement on 10/16 with initiation of tube feeds. Amanda [name] with improved oral intake and PEG tube removed on 12/2. -Dietitian recommendations (12/15): Continue current diet as ordered per SLP Encourage PO intake  Changed to ordering assistance Ensure Plus  High Protein po TID, each supplement provides 350 kcal and 20 grams of protein MVI with minerals daily         RN Pressure Injury Documentation:    Malnutrition Type:  Nutrition Problem: Moderate Malnutrition Etiology: social / environmental circumstances (polysubstance abuse)   Malnutrition Characteristics:  Signs/Symptoms: mild muscle depletion, moderate fat depletion, mild fat depletion,  moderate muscle depletion   Nutrition Interventions:  Interventions: Ensure Enlive (each supplement provides 350kcal and 20 grams of protein), MVI  Estimated body mass index is 20.21 kg/m as calculated from the following:   Height as of this encounter: 5' 5 (1.651 m).   Weight as of this encounter: 55.1 kg.  Code Status: full code.  DVT Prophylaxis:  Place and maintain sequential compression device Start: 01/03/24 0611 SCDs Start: 01/03/24 0429   Level of Care: Level of care: Med-Surg Family Communication: none at bedside.   Disposition Plan:     Remains inpatient appropriate:  pending.    Procedures:  None.   Consultants:   None.   Antimicrobials:   Anti-infectives (From admission, onward)    Start     Dose/Rate Route Frequency Ordered Stop   01/27/24 0807  ceFAZolin  (ANCEF ) IVPB 2g/100 mL premix        over 30 Minutes Intravenous Continuous PRN 01/27/24 0807 01/27/24 0807   01/13/24 0600  ceFAZolin  (ANCEF ) IVPB 2g/100 mL premix        2 g 200 mL/hr over 30 Minutes Intravenous On call to O.R. 01/11/24 2300 01/13/24 0557        Medications  Scheduled Meds:  carvedilol   25 mg Oral BID WC   feeding supplement  237 mL Oral TID BM   mineral oil-hydrophilic petrolatum    Topical BID   QUEtiapine   25 mg Oral QAC supper   sacubitril -valsartan   1 tablet Oral BID   senna-docusate  1 tablet Oral QHS   spironolactone   25 mg Oral Daily   Continuous Infusions: PRN Meds:.acetaminophen  **OR** acetaminophen  (TYLENOL ) oral liquid 160 mg/5 mL **OR** acetaminophen , benzonatate , hydrALAZINE , hydrALAZINE , ipratropium-albuterol , labetalol , menthol , OLANZapine , mouth rinse, polyethylene glycol, white petrolatum     Subjective:   [name] was seen and examined today.  No new events.   Objective:   Vitals:   04/04/24 2031 04/04/24 2336 04/05/24 0800 04/05/24 0838  BP: (!) 144/88 137/81 120/81 120/81  Pulse: 82 90 77 77  Resp: 17 18 17    Temp: 98.1 F (36.7 C) 98 F  (36.7 C) 98.3 F (36.8 C)   TempSrc:  Oral Oral   SpO2: 99% 100% 100%   Weight:      Height:       No intake or output data in the 24 hours ending 04/05/24 1445 Filed Weights   03/31/24 0600 04/01/24 0500 04/02/24 0719  Weight: 54.6 kg 55.9 kg 55.1 kg     Exam General: Alert and oriented x 3, NAD Cardiovascular: S1 S2 auscultated, no murmurs, RRR Respiratory: Clear to auscultation bilaterally, no wheezing, rales or rhonchi Gastrointestinal: Soft, nontender, nondistended, + bowel sounds Ext: no pedal edema bilaterally Neuro: AAOx3, Skin: No rashes Psych: Normal affect and demeanor, alert and oriented x3    Data Reviewed:  I have personally reviewed following labs and imaging studies   CBC Lab Results  Component Value Date   WBC 6.1 03/25/2024   RBC 4.10 03/25/2024   HGB 11.9 (L) 03/25/2024   HCT 36.2 03/25/2024   MCV 88.3 03/25/2024   MCH 29.0 03/25/2024   PLT 279  03/25/2024   MCHC 32.9 03/25/2024   RDW 15.6 (H) 03/25/2024   LYMPHSABS 1.9 03/25/2024   MONOABS 0.5 03/25/2024   EOSABS 0.3 03/25/2024   BASOSABS 0.0 03/25/2024     Last metabolic panel Lab Results  Component Value Date   NA 140 03/25/2024   K 3.6 03/25/2024   CL 105 03/25/2024   CO2 25 03/25/2024   BUN 13 03/25/2024   CREATININE 0.81 03/25/2024   GLUCOSE 131 (H) 03/25/2024   GFRNONAA >60 03/25/2024   GFRAA >90 01/21/2014   CALCIUM 9.3 03/25/2024   PHOS 4.1 01/21/2024   PROT 6.7 03/15/2024   ALBUMIN 3.0 (L) 03/15/2024   LABGLOB 3.6 11/15/2023   BILITOT 0.6 03/15/2024   ALKPHOS 51 03/15/2024   AST 18 03/15/2024   ALT 27 03/15/2024   ANIONGAP 10 03/25/2024    CBG (last 3)  No results for input(s): GLUCAP in the last 72 hours.    Coagulation Profile: No results for input(s): INR, PROTIME in the last 168 hours.   Radiology Studies: No results found.     Elgie Butter M.D. [facility] 04/05/2024, 2:45 PM  Available via Epic secure chat 7am-7pm After 7 pm,  please refer to night coverage provider listed on amion.    "

## 2024-04-05 NOTE — Plan of Care (Signed)
 She is now turning off the alarms on the bed and chair. Talked to her abouty the safety of these items but she continued to cut it off and I found her coming from the bathroom with her walker independently and the alarm was turned off.     Problem: Education: Goal: Ability to describe self-care measures that may prevent or decrease complications (Diabetes Survival Skills Education) will improve Outcome: Progressing   Problem: Coping: Goal: Ability to adjust to condition or change in health will improve Outcome: Progressing   Problem: Nutritional: Goal: Maintenance of adequate nutrition will improve Outcome: Progressing   Problem: Skin Integrity: Goal: Risk for impaired skin integrity will decrease Outcome: Progressing   Problem: Activity: Goal: Risk for activity intolerance will decrease Outcome: Progressing   Problem: Safety: Goal: Ability to remain free from injury will improve Outcome: Progressing

## 2024-04-05 NOTE — Plan of Care (Signed)
" °  Problem: Health Behavior/Discharge Planning: Goal: Ability to identify and utilize available resources and services will improve Outcome: Progressing Goal: Ability to manage health-related needs will improve Outcome: Progressing   Problem: Nutritional: Goal: Maintenance of adequate nutrition will improve Outcome: Progressing Goal: Progress toward achieving an optimal weight will improve Outcome: Progressing   Problem: Skin Integrity: Goal: Risk for impaired skin integrity will decrease Outcome: Progressing   Problem: Clinical Measurements: Goal: Ability to maintain clinical measurements within normal limits will improve Outcome: Progressing Goal: Will remain free from infection Outcome: Progressing Goal: Diagnostic test results will improve Outcome: Progressing Goal: Respiratory complications will improve Outcome: Progressing Goal: Cardiovascular complication will be avoided Outcome: Progressing   Problem: Activity: Goal: Risk for activity intolerance will decrease Outcome: Progressing   Problem: Elimination: Goal: Will not experience complications related to bowel motility Outcome: Progressing Goal: Will not experience complications related to urinary retention Outcome: Progressing   Problem: Safety: Goal: Ability to remain free from injury will improve Outcome: Progressing   "

## 2024-04-06 DIAGNOSIS — F1011 Alcohol abuse, in remission: Secondary | ICD-10-CM | POA: Diagnosis not present

## 2024-04-06 DIAGNOSIS — E440 Moderate protein-calorie malnutrition: Secondary | ICD-10-CM | POA: Diagnosis not present

## 2024-04-06 DIAGNOSIS — I610 Nontraumatic intracerebral hemorrhage in hemisphere, subcortical: Secondary | ICD-10-CM | POA: Diagnosis not present

## 2024-04-06 DIAGNOSIS — I10 Essential (primary) hypertension: Secondary | ICD-10-CM | POA: Diagnosis not present

## 2024-04-06 NOTE — Plan of Care (Signed)
 Removed her SCDs because she was refusing them and she also keeps trying to get up out of the bed with SCDs still attached and was tangled in the tubing and almost fell.  This has happened a few times and she is a high fall risk and has fallen several times.  I did send a message to the doctor to see if there was something else she could have for her VTE, like lovenox  or something that will not cause her to fall.  Dr said she would have to review her chart and decide.    Problem: Coping: Goal: Ability to adjust to condition or change in health will improve Outcome: Progressing   Problem: Health Behavior/Discharge Planning: Goal: Ability to identify and utilize available resources and services will improve Outcome: Progressing   Problem: Health Behavior/Discharge Planning: Goal: Ability to manage health-related needs will improve Outcome: Progressing   Problem: Skin Integrity: Goal: Risk for impaired skin integrity will decrease Outcome: Progressing   Problem: Education: Goal: Knowledge of General Education information will improve Description: Including pain rating scale, medication(s)/side effects and non-pharmacologic comfort measures Outcome: Progressing   Problem: Health Behavior/Discharge Planning: Goal: Ability to manage health-related needs will improve Outcome: Progressing

## 2024-04-06 NOTE — Progress Notes (Signed)
 Discussed with Dr Cherlyn and she said to leave off tonight and she will have a conversation with neuro surgery and decide if she can be put on lovenox  or what other options she may have.

## 2024-04-06 NOTE — Progress Notes (Signed)
 "        [facility]                                                                               Amanda [name], is a 63 y.o. female, DOB - 1960-06-26, FMW:[id number] Admit date - 01/03/2024    Outpatient Primary MD for the patient is Patient, No Pcp Per  LOS - 94  days    Brief summary   Amanda [name] is a 63 y.o. female with a history of hypertension, heart failure, cocaine abuse,d diabetes mellitus type 2. Patient presented secondary to lethargy and near syncope, found to have significantly elevated blood pressure and CT evidence of acute intraparenchymal hemorrhage with intraventricular hemorrhage and hydrocephalus. Patient admitted to ICU for close blood pressure management and Neurosurgery consulted, performing a right frontal ventriculostomy. EVD eventually removed on 10/3.  PEG tube required on 10/16 and removed on 12/2.   Assessment & Plan    Assessment and Plan:   Acute right thalamic intraparenchymal hemorrhage Intraventricular hemorrhage Obstructive hydrocephalus In setting of cocaine use. 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 Neurosurgery consulted while the patient was in the ED, with neurosurgery recommendation for no emergent placement of a ventricular drain. Patient intubated and transferred to the ICU on admission. Blood pressure management with Cleviprex . Repeat CT head significant for increase in size of medial right thalamic hematoma. Patient underwent right frontal ventriculostomy on 9/22 which was eventual clamped on 10/1 and removed on 10/3.     Acute metabolic encephalopathy Present on presentation and related to hydrocephalus and IVH.  Today patient is alert and answering questions appropriately.    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  -Continue Coreg , spironolactone  and Entresto  - she appears euvolemic.    Acute  respiratory failure with hypoxia Patient with worsening mental status and concern for inability to protect her airway on admission. Patient was intubated successfully on 9/22 and managed via mechanical ventilation. Patient extubated successfully on 9/23. On room air.   Tachypnea Unclear etiology. Per patient, no dyspnea. Possibly related to aspiration in setting of dysphagia and tube feeds. Chest x-ray unremarkable. Patient remains with SpO2 of 100% on room air. Possibly anxiety related. VBG consistent with respiratory alkalosis. D-dimer negative. Symptoms resolved.   Primary hypertension Hypertensive emergency Hypertensive emergency in setting of stroke and severely elevated blood pressure. Patient managed on Cleviprex  drip while in ICU. Cleviprex  weaned off. -Continue amlodipine , Coreg , spironolactone  and Entresto . -- BP parameters are optimal.    Diabetes mellitus type 2 Well controlled based on hemoglobin A1C of 6.6%. CBG (last 3)  No results for input(s): GLUCAP in the last 72 hours.  Cocaine abuse Noted. Patient with a history of cocaine use, most recently 3 hours prior to presentation. Cocaine positive on UDS.   Abnormal CT chest Imaging significant for right apical pleural scarring versus density. Recommendation for repeat CT scan in 3 months from 01/03/2024. Will order CT chest in am.    Dysphagia Moderate malnutrition Patient required PEG tube placement on 10/16 with initiation of tube feeds. Patient with improved oral intake and PEG tube removed on 12/2. -  Dietitian recommendations (12/15): Continue current diet as ordered per SLP Encourage PO intake  Changed to ordering assistance Ensure Plus High Protein po TID, each supplement provides 350 kcal and 20 grams of protein MVI with minerals daily         RN Pressure Injury Documentation:    Malnutrition Type:  Nutrition Problem: Moderate Malnutrition Etiology: social / environmental circumstances (polysubstance  abuse)   Malnutrition Characteristics:  Signs/Symptoms: mild muscle depletion, moderate fat depletion, mild fat depletion, moderate muscle depletion   Nutrition Interventions:  Interventions: Ensure Enlive (each supplement provides 350kcal and 20 grams of protein), MVI  Estimated body mass index is 20.21 kg/m as calculated from the following:   Height as of this encounter: 5' 5 (1.651 m).   Weight as of this encounter: 55.1 kg.  Code Status: full code.  DVT Prophylaxis:  Place and maintain sequential compression device Start: 01/03/24 0611 SCDs Start: 01/03/24 0429   Level of Care: Level of care: Med-Surg Family Communication: none at bedside.   Disposition Plan:     Remains inpatient appropriate:  pending placement.   Procedures:  None.   Consultants:   None.   Antimicrobials:   Anti-infectives (From admission, onward)    Start     Dose/Rate Route Frequency Ordered Stop   01/27/24 0807  ceFAZolin  (ANCEF ) IVPB 2g/100 mL premix        over 30 Minutes Intravenous Continuous PRN 01/27/24 0807 01/27/24 0807   01/13/24 0600  ceFAZolin  (ANCEF ) IVPB 2g/100 mL premix        2 g 200 mL/hr over 30 Minutes Intravenous On call to O.R. 01/11/24 2300 01/13/24 0557        Medications  Scheduled Meds:  carvedilol   25 mg Oral BID WC   feeding supplement  237 mL Oral TID BM   mineral oil-hydrophilic petrolatum    Topical BID   QUEtiapine   25 mg Oral QAC supper   sacubitril -valsartan   1 tablet Oral BID   senna-docusate  1 tablet Oral QHS   spironolactone   25 mg Oral Daily   Continuous Infusions: PRN Meds:.acetaminophen  **OR** acetaminophen  (TYLENOL ) oral liquid 160 mg/5 mL **OR** acetaminophen , benzonatate , hydrALAZINE , hydrALAZINE , ipratropium-albuterol , labetalol , menthol , OLANZapine , mouth rinse, polyethylene glycol, white petrolatum     Subjective:   Amanda [name] was seen and examined today.  No new complaints.  Objective:   Vitals:   04/05/24 1713 04/05/24 2025  04/06/24 0711 04/06/24 0849  BP: 128/81 126/77  (!) 141/87  Pulse: 76 75 72 76  Resp:  18 16   Temp:  (!) 97.3 F (36.3 C) 97.8 F (36.6 C)   TempSrc:  Oral Oral   SpO2:  94% 95%   Weight:      Height:       No intake or output data in the 24 hours ending 04/06/24 1335 Filed Weights   03/31/24 0600 04/01/24 0500 04/02/24 0719  Weight: 54.6 kg 55.9 kg 55.1 kg     Exam General exam: Appears calm and comfortable  Respiratory system: Clear to auscultation. Respiratory effort normal. Cardiovascular system: S1 & S2 heard, RRR.  Gastrointestinal system: Abdomen is nondistended, soft and nontender.  Central nervous system: Alert and oriented. No focal neurological deficits. Extremities: no edema.   Data Reviewed:  I have personally reviewed following labs and imaging studies   CBC Lab Results  Component Value Date   WBC 6.1 03/25/2024   RBC 4.10 03/25/2024   HGB 11.9 (L) 03/25/2024   HCT 36.2 03/25/2024   MCV  88.3 03/25/2024   MCH 29.0 03/25/2024   PLT 279 03/25/2024   MCHC 32.9 03/25/2024   RDW 15.6 (H) 03/25/2024   LYMPHSABS 1.9 03/25/2024   MONOABS 0.5 03/25/2024   EOSABS 0.3 03/25/2024   BASOSABS 0.0 03/25/2024     Last metabolic panel Lab Results  Component Value Date   NA 140 03/25/2024   K 3.6 03/25/2024   CL 105 03/25/2024   CO2 25 03/25/2024   BUN 13 03/25/2024   CREATININE 0.81 03/25/2024   GLUCOSE 131 (H) 03/25/2024   GFRNONAA >60 03/25/2024   GFRAA >90 01/21/2014   CALCIUM 9.3 03/25/2024   PHOS 4.1 01/21/2024   PROT 6.7 03/15/2024   ALBUMIN 3.0 (L) 03/15/2024   LABGLOB 3.6 11/15/2023   BILITOT 0.6 03/15/2024   ALKPHOS 51 03/15/2024   AST 18 03/15/2024   ALT 27 03/15/2024   ANIONGAP 10 03/25/2024    CBG (last 3)  No results for input(s): GLUCAP in the last 72 hours.    Coagulation Profile: No results for input(s): INR, PROTIME in the last 168 hours.   Radiology Studies: No results found.     Elgie Butter M.D. [facility] 04/06/2024, 1:35 PM  Available via Epic secure chat 7am-7pm After 7 pm, please refer to night coverage provider listed on amion.    "

## 2024-04-07 ENCOUNTER — Inpatient Hospital Stay (HOSPITAL_COMMUNITY)

## 2024-04-07 DIAGNOSIS — F1011 Alcohol abuse, in remission: Secondary | ICD-10-CM | POA: Diagnosis not present

## 2024-04-07 DIAGNOSIS — I610 Nontraumatic intracerebral hemorrhage in hemisphere, subcortical: Secondary | ICD-10-CM | POA: Diagnosis not present

## 2024-04-07 DIAGNOSIS — I10 Essential (primary) hypertension: Secondary | ICD-10-CM | POA: Diagnosis not present

## 2024-04-07 DIAGNOSIS — E440 Moderate protein-calorie malnutrition: Secondary | ICD-10-CM | POA: Diagnosis not present

## 2024-04-07 LAB — BASIC METABOLIC PANEL WITH GFR
Anion gap: 13 (ref 5–15)
BUN: 16 mg/dL (ref 8–23)
CO2: 21 mmol/L — ABNORMAL LOW (ref 22–32)
Calcium: 9.7 mg/dL (ref 8.9–10.3)
Chloride: 106 mmol/L (ref 98–111)
Creatinine, Ser: 0.82 mg/dL (ref 0.44–1.00)
GFR, Estimated: >60 mL/min
Glucose, Bld: 115 mg/dL — ABNORMAL HIGH (ref 70–99)
Potassium: 3.8 mmol/L (ref 3.5–5.1)
Sodium: 139 mmol/L (ref 135–145)

## 2024-04-07 NOTE — Progress Notes (Addendum)
 Assisted to bathroom to pee, upon returning to bed, patient became verbally agressive towards this nurse and attempted to shove her walker towards this nurse's foot. Patient screamed at this nurse to leave her by the side of bed, told her that this cannot be done until another staff comes in to assist her back safely back to bed. Staff called for reinforcement and assistance. Art Therapist assisted her back to bed. Bed alarm made sure to be on by [facility].

## 2024-04-07 NOTE — Plan of Care (Signed)
" °  Problem: Fluid Volume: Goal: Ability to maintain a balanced intake and output will improve Outcome: Not Progressing   Problem: Coping: Goal: Ability to adjust to condition or change in health will improve Outcome: Not Progressing   Problem: Health Behavior/Discharge Planning: Goal: Ability to identify and utilize available resources and services will improve Outcome: Not Progressing Goal: Ability to manage health-related needs will improve Outcome: Not Progressing   Problem: Tissue Perfusion: Goal: Adequacy of tissue perfusion will improve Outcome: Not Progressing   Problem: Safety: Goal: Ability to remain free from injury will improve Outcome: Not Progressing   "

## 2024-04-07 NOTE — Progress Notes (Signed)
 "        [facility]                                                                               Amanda [name], is a 63 y.o. female, DOB - 1961/01/02, FMW:[id number] Admit date - 01/03/2024    Outpatient Primary MD for the patient is Patient, No Pcp Per  LOS - 95  days    Brief summary   Amanda [name] is a 63 y.o. female with a history of hypertension, heart failure, cocaine abuse,d diabetes mellitus type 2. Patient presented secondary to lethargy and near syncope, found to have significantly elevated blood pressure and CT evidence of acute intraparenchymal hemorrhage with intraventricular hemorrhage and hydrocephalus. Patient admitted to ICU for close blood pressure management and Neurosurgery consulted, performing a right frontal ventriculostomy. EVD eventually removed on 10/3.  PEG tube required on 10/16 and removed on 12/2.   Assessment & Plan    Assessment and Plan:   Acute right thalamic intraparenchymal hemorrhage Intraventricular hemorrhage Obstructive hydrocephalus In setting of cocaine use. 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 Neurosurgery consulted while the patient was in the ED, with neurosurgery recommendation for no emergent placement of a ventricular drain. Patient intubated and transferred to the ICU on admission. Blood pressure management with Cleviprex . Repeat CT head significant for increase in size of medial right thalamic hematoma. Patient underwent right frontal ventriculostomy on 9/22 which was eventual clamped on 10/1 and removed on 10/3.  Repeat CT head without contrast today to evaluate for resolution of intra cerebral bleed.     Acute metabolic encephalopathy Present on presentation and related to hydrocephalus and IVH.  She is irritable, sometimes doesn't follow commands.    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   -Continue Coreg , spironolactone  and Entresto  - she appears euvolemic.    Acute respiratory failure with hypoxia Patient with worsening mental status and concern for inability to protect her airway on admission. Patient was intubated successfully on 9/22 and managed via mechanical ventilation. Patient extubated successfully on 9/23. On room air.   Tachypnea Unclear etiology. Per patient, no dyspnea. Possibly related to aspiration in setting of dysphagia and tube feeds. Chest x-ray unremarkable. Patient remains with SpO2 of 100% on room air. Possibly anxiety related. VBG consistent with respiratory alkalosis. D-dimer negative. Symptoms resolved.   Primary hypertension Hypertensive emergency Hypertensive emergency in setting of stroke and severely elevated blood pressure. Patient managed on Cleviprex  drip while in ICU. Cleviprex  weaned off. -Continue amlodipine , Coreg , spironolactone  and Entresto . -- BP parameters are optimal.    Diabetes mellitus type 2 Well controlled based on hemoglobin A1C of 6.6%. CBG (last 3)  No results for input(s): GLUCAP in the last 72 hours.  Cocaine abuse Noted. Patient with a history of cocaine use, most recently 3 hours prior to presentation. Cocaine positive on UDS.   Abnormal CT chest Imaging significant for right apical pleural scarring versus density. Recommendation for repeat CT scan in 3 months from 01/03/2024. Repeat CT chest today.    Dysphagia Moderate malnutrition Patient required PEG tube placement on 10/16 with initiation of tube  feeds. Patient with improved oral intake and PEG tube removed on 12/2. -Dietitian recommendations (12/15): Continue current diet as ordered per SLP Encourage PO intake  Changed to ordering assistance Ensure Plus High Protein po TID, each supplement provides 350 kcal and 20 grams of protein MVI with minerals daily         RN Pressure Injury Documentation:    Malnutrition Type:  Nutrition Problem:  Moderate Malnutrition Etiology: social / environmental circumstances (polysubstance abuse)   Malnutrition Characteristics:  Signs/Symptoms: mild muscle depletion, moderate fat depletion, mild fat depletion, moderate muscle depletion   Nutrition Interventions:  Interventions: Ensure Enlive (each supplement provides 350kcal and 20 grams of protein), MVI  Estimated body mass index is 20.54 kg/m as calculated from the following:   Height as of this encounter: 5' 5 (1.651 m).   Weight as of this encounter: 56 kg.  Code Status: full code.  DVT Prophylaxis:  Place and maintain sequential compression device Start: 01/03/24 0611 SCDs Start: 01/03/24 0429   Level of Care: Level of care: Med-Surg Family Communication: none at bedside.   Disposition Plan:     Remains inpatient appropriate:  pending placement.   Procedures:  None.   Consultants:   None.   Antimicrobials:   Anti-infectives (From admission, onward)    Start     Dose/Rate Route Frequency Ordered Stop   01/27/24 0807  ceFAZolin  (ANCEF ) IVPB 2g/100 mL premix        over 30 Minutes Intravenous Continuous PRN 01/27/24 0807 01/27/24 0807   01/13/24 0600  ceFAZolin  (ANCEF ) IVPB 2g/100 mL premix        2 g 200 mL/hr over 30 Minutes Intravenous On call to O.R. 01/11/24 2300 01/13/24 0557        Medications  Scheduled Meds:  carvedilol   25 mg Oral BID WC   feeding supplement  237 mL Oral TID BM   mineral oil-hydrophilic petrolatum    Topical BID   QUEtiapine   25 mg Oral QAC supper   sacubitril -valsartan   1 tablet Oral BID   senna-docusate  1 tablet Oral QHS   spironolactone   25 mg Oral Daily   Continuous Infusions: PRN Meds:.acetaminophen  **OR** acetaminophen  (TYLENOL ) oral liquid 160 mg/5 mL **OR** acetaminophen , benzonatate , hydrALAZINE , hydrALAZINE , ipratropium-albuterol , labetalol , menthol , OLANZapine , mouth rinse, polyethylene glycol, white petrolatum     Subjective:   Amanda [name] was seen and examined  today.  She is refusing SCD'S.    Vitals:   04/06/24 1944 04/07/24 0500 04/07/24 0747 04/07/24 0917  BP: 117/71  (!) 140/88 (!) 140/88  Pulse: 75  78 78  Resp: 18  16   Temp: (!) 97.5 F (36.4 C)  98.3 F (36.8 C)   TempSrc: Oral     SpO2: 99%  96%   Weight:  56 kg    Height:       No intake or output data in the 24 hours ending 04/07/24 1400 Filed Weights   04/01/24 0500 04/02/24 0719 04/07/24 0500  Weight: 55.9 kg 55.1 kg 56 kg     Exam General exam: Appears calm and comfortable  Respiratory system: Clear to auscultation. Respiratory effort normal. Cardiovascular system: S1 & S2 heard, RRR. Gastrointestinal system: Abdomen is soft bs+ Central nervous system: Alert and oriented to self and place.  Extremities: Symmetric 5 x 5 power. Skin: No rashes,  Psychiatry: Mood & affect appropriate.    Data Reviewed:  I have personally reviewed following labs and imaging studies   CBC Lab Results  Component Value Date  WBC 6.1 03/25/2024   RBC 4.10 03/25/2024   HGB 11.9 (L) 03/25/2024   HCT 36.2 03/25/2024   MCV 88.3 03/25/2024   MCH 29.0 03/25/2024   PLT 279 03/25/2024   MCHC 32.9 03/25/2024   RDW 15.6 (H) 03/25/2024   LYMPHSABS 1.9 03/25/2024   MONOABS 0.5 03/25/2024   EOSABS 0.3 03/25/2024   BASOSABS 0.0 03/25/2024     Last metabolic panel Lab Results  Component Value Date   NA 140 03/25/2024   K 3.6 03/25/2024   CL 105 03/25/2024   CO2 25 03/25/2024   BUN 13 03/25/2024   CREATININE 0.81 03/25/2024   GLUCOSE 131 (H) 03/25/2024   GFRNONAA >60 03/25/2024   GFRAA >90 01/21/2014   CALCIUM 9.3 03/25/2024   PHOS 4.1 01/21/2024   PROT 6.7 03/15/2024   ALBUMIN 3.0 (L) 03/15/2024   LABGLOB 3.6 11/15/2023   BILITOT 0.6 03/15/2024   ALKPHOS 51 03/15/2024   AST 18 03/15/2024   ALT 27 03/15/2024   ANIONGAP 10 03/25/2024    CBG (last 3)  No results for input(s): GLUCAP in the last 72 hours.    Coagulation Profile: No results for input(s): INR,  PROTIME in the last 168 hours.   Radiology Studies: No results found.     Elgie Butter M.D. [facility] 04/07/2024, 2:00 PM  Available via Epic secure chat 7am-7pm After 7 pm, please refer to night coverage provider listed on amion.    "

## 2024-04-07 NOTE — Plan of Care (Signed)
 Discussed with her the importance of not turning off alarm. Alarm was off at beginning of shift at report from night shift.    Problem: Education: Goal: Ability to describe self-care measures that may prevent or decrease complications (Diabetes Survival Skills Education) will improve Outcome: Progressing   Problem: Health Behavior/Discharge Planning: Goal: Ability to identify and utilize available resources and services will improve Outcome: Progressing   Problem: Nutritional: Goal: Maintenance of adequate nutrition will improve Outcome: Progressing   Problem: Safety: Goal: Ability to remain free from injury will improve Outcome: Progressing   Problem: Skin Integrity: Goal: Risk for impaired skin integrity will decrease Outcome: Progressing

## 2024-04-07 NOTE — TOC Progression Note (Signed)
 Transition of Care [facility]) - Progression Note    Patient Details  Name: Amanda [name] MRN: [id number] Date of Birth: Aug 11, 1960  [facility] CM/SW Contact  Almarie CHRISTELLA [place]ie, [place] Phone Number: 04/07/2024, 11:05 AM  Clinical Narrative:   CSW following for disposition. No bed offers available at this time. CSW to follow.    Expected Discharge Plan: Skilled Nursing Facility Barriers to Discharge: English As A Second Language Teacher, Continued Medical Work up, Inadequate or no insurance, Facility will not accept until restraint criteria met, SNF Pending bed offer               Expected Discharge Plan and Services In-house Referral: Clinical Social Work   Post Acute Care Choice: Skilled Nursing Facility Living arrangements for the past 2 months: Single Family Home                                       Social Drivers of Health (SDOH) Interventions SDOH Screenings   Food Insecurity: No Food Insecurity (07/16/2023)   Received from [facility]  Housing: Low Risk (07/16/2023)   Received from [facility]  Transportation Needs: No Transportation Needs (07/16/2023)   Received from [facility]  Utilities: Not At Risk (07/16/2023)   Received from [facility]  Alcohol Screen: Low Risk (05/14/2023)  Financial Resource Strain: Low Risk (07/16/2023)   Received from [facility]  Tobacco Use: High Risk (01/26/2024)    Readmission Risk Interventions    03/26/2022   10:50 AM  Readmission Risk Prevention Plan  Transportation Screening Complete  HRI or Home Care Consult Complete  Social Work Consult for Recovery Care Planning/Counseling Complete  Palliative Care Screening Not Applicable  Medication Review Oceanographer) Referral to Pharmacy

## 2024-04-08 DIAGNOSIS — I610 Nontraumatic intracerebral hemorrhage in hemisphere, subcortical: Secondary | ICD-10-CM | POA: Diagnosis not present

## 2024-04-08 DIAGNOSIS — E440 Moderate protein-calorie malnutrition: Secondary | ICD-10-CM | POA: Diagnosis not present

## 2024-04-08 DIAGNOSIS — F1011 Alcohol abuse, in remission: Secondary | ICD-10-CM | POA: Diagnosis not present

## 2024-04-08 DIAGNOSIS — I10 Essential (primary) hypertension: Secondary | ICD-10-CM | POA: Diagnosis not present

## 2024-04-08 NOTE — Plan of Care (Signed)
" °  Problem: Education: Goal: Ability to describe self-care measures that may prevent or decrease complications (Diabetes Survival Skills Education) will improve 04/08/2024 1627 by Quin Tacy BRAVO, RN Outcome: Progressing 04/08/2024 1627 by Quin Tacy BRAVO, RN Outcome: Progressing   Problem: Coping: Goal: Ability to adjust to condition or change in health will improve 04/08/2024 1627 by Quin Tacy BRAVO, RN Outcome: Progressing 04/08/2024 1627 by Quin Tacy BRAVO, RN Outcome: Progressing   Problem: Fluid Volume: Goal: Ability to maintain a balanced intake and output will improve 04/08/2024 1627 by Quin Tacy BRAVO, RN Outcome: Progressing 04/08/2024 1627 by Quin Tacy BRAVO, RN Outcome: Progressing   Problem: Health Behavior/Discharge Planning: Goal: Ability to identify and utilize available resources and services will improve Outcome: Progressing   "

## 2024-04-08 NOTE — Progress Notes (Signed)
 "        [facility]                                                                               Amanda [name], is a 63 y.o. female, DOB - 1961/04/10, FMW:[id number] Admit date - 01/03/2024    Outpatient Primary MD for the patient is Patient, No Pcp Per  LOS - 96  days    Brief summary   Amanda [name] is a 63 y.o. female with a history of hypertension, heart failure, cocaine abuse,d diabetes mellitus type 2. Patient presented secondary to lethargy and near syncope, found to have significantly elevated blood pressure and CT evidence of acute intraparenchymal hemorrhage with intraventricular hemorrhage and hydrocephalus. Patient admitted to ICU for close blood pressure management and Neurosurgery consulted, performing a right frontal ventriculostomy. EVD eventually removed on 10/3.  PEG tube required on 10/16 and removed on 12/2. No new events overnight.   Assessment & Plan    Assessment and Plan:   Acute right thalamic intraparenchymal hemorrhage Intraventricular hemorrhage Obstructive hydrocephalus In setting of cocaine use. 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 Neurosurgery consulted while the patient was in the ED, with neurosurgery recommendation for no emergent placement of a ventricular drain. Patient intubated and transferred to the ICU on admission. Blood pressure management with Cleviprex . Repeat CT head significant for increase in size of medial right thalamic hematoma. Patient underwent right frontal ventriculostomy on 9/22 which was eventual clamped on 10/1 and removed on 10/3.  Repeat CT head without contrast ordered to evaluate for resolution of intra cerebral bleed.     Acute metabolic encephalopathy Present on presentation and related to hydrocephalus and IVH.  She is irritable, sometimes doesn't follow commands.     Chronic combined systolic and diastolic heart failure LVEF of 40% with associated  grade I diastolic dysfunction.  -Continue Coreg , spironolactone  and Entresto  - she appears euvolemic.    Acute respiratory failure with hypoxia Patient with worsening mental status and concern for inability to protect her airway on admission. Patient was intubated successfully on 9/22 and managed via mechanical ventilation. Patient extubated successfully on 9/23.  She is currently on RA, but has some cough.    Tachypnea Unclear etiology. Per patient, no dyspnea. Possibly related to aspiration in setting of dysphagia and tube feeds. Chest x-ray unremarkable. Patient remains with SpO2 of 100% on room air. Possibly anxiety related. VBG consistent with respiratory alkalosis. D-dimer negative. Symptoms resolved.   Primary hypertension Hypertensive emergency Hypertensive emergency in setting of stroke and severely elevated blood pressure. Patient managed on Cleviprex  drip while in ICU. Cleviprex  weaned off. -Continue amlodipine , Coreg , spironolactone  and Entresto . -- BP parameters are well controlled.    Diabetes mellitus type 2 Well controlled based on hemoglobin A1C of 6.6%. CBG (last 3)  No results for input(s): GLUCAP in the last 72 hours.  Cocaine abuse Noted. Patient with a history of cocaine use, most recently 3 hours prior to presentation. Cocaine positive on UDS.   Abnormal CT chest Imaging significant for right apical pleural scarring versus density. Recommendation for repeat CT scan in 3 months from 01/03/2024. Repeat CT chest today.  Dysphagia Moderate malnutrition Patient required PEG tube placement on 10/16 with initiation of tube feeds. Patient with improved oral intake and PEG tube removed on 12/2. -Dietitian recommendations (12/15): Continue current diet as ordered per SLP Encourage PO intake  Changed to ordering assistance Ensure Plus High Protein po TID, each supplement provides 350 kcal and 20 grams of protein MVI with minerals daily         RN Pressure  Injury Documentation:    Malnutrition Type:  Nutrition Problem: Moderate Malnutrition Etiology: social / environmental circumstances (polysubstance abuse)   Malnutrition Characteristics:  Signs/Symptoms: mild muscle depletion, moderate fat depletion, mild fat depletion, moderate muscle depletion   Nutrition Interventions:  Interventions: Ensure Enlive (each supplement provides 350kcal and 20 grams of protein), MVI  Estimated body mass index is 20.62 kg/m as calculated from the following:   Height as of this encounter: 5' 5 (1.651 m).   Weight as of this encounter: 56.2 kg.  Code Status: full code.  DVT Prophylaxis:  Place and maintain sequential compression device Start: 01/03/24 0611 SCDs Start: 01/03/24 0429   Level of Care: Level of care: Med-Surg Family Communication: none at bedside.   Disposition Plan:     Remains inpatient appropriate:  pending placement.   Procedures:  None.   Consultants:   None.   Antimicrobials:   Anti-infectives (From admission, onward)    Start     Dose/Rate Route Frequency Ordered Stop   01/27/24 0807  ceFAZolin  (ANCEF ) IVPB 2g/100 mL premix        over 30 Minutes Intravenous Continuous PRN 01/27/24 0807 01/27/24 0807   01/13/24 0600  ceFAZolin  (ANCEF ) IVPB 2g/100 mL premix        2 g 200 mL/hr over 30 Minutes Intravenous On call to O.R. 01/11/24 2300 01/13/24 0557        Medications  Scheduled Meds:  carvedilol   25 mg Oral BID WC   feeding supplement  237 mL Oral TID BM   mineral oil-hydrophilic petrolatum    Topical BID   QUEtiapine   25 mg Oral QAC supper   sacubitril -valsartan   1 tablet Oral BID   senna-docusate  1 tablet Oral QHS   spironolactone   25 mg Oral Daily   Continuous Infusions: PRN Meds:.acetaminophen  **OR** acetaminophen  (TYLENOL ) oral liquid 160 mg/5 mL **OR** acetaminophen , benzonatate , hydrALAZINE , hydrALAZINE , ipratropium-albuterol , labetalol , menthol , OLANZapine , mouth rinse, polyethylene glycol,  white petrolatum     Subjective:   Amanda [name] was seen and examined today.  She continues to refuse SCD'S  Vitals:   04/07/24 1933 04/08/24 0500 04/08/24 0805 04/08/24 1625  BP: (!) 143/82  (!) 132/98 (!) 144/97  Pulse: 71  83 72  Resp: 16  16 16   Temp: 98.2 F (36.8 C)  98.3 F (36.8 C) 97.8 F (36.6 C)  TempSrc:    Oral  SpO2: 95%   99%  Weight:  56.2 kg    Height:       No intake or output data in the 24 hours ending 04/08/24 1709 Filed Weights   04/02/24 0719 04/07/24 0500 04/08/24 0500  Weight: 55.1 kg 56 kg 56.2 kg     Exam General exam: Appears calm and comfortable  Respiratory system: Clear to auscultation. Respiratory effort normal. Cardiovascular system: S1 & S2 heard, RRR Gastrointestinal system: Abdomen is soft bs+ Central nervous system: Alert and oriented to person and place.  Extremities: no pedal edema.  Skin: No rashes,  Psychiatry: Mood is appropriate.     Data Reviewed:  I have personally  reviewed following labs and imaging studies   CBC Lab Results  Component Value Date   WBC 6.1 03/25/2024   RBC 4.10 03/25/2024   HGB 11.9 (L) 03/25/2024   HCT 36.2 03/25/2024   MCV 88.3 03/25/2024   MCH 29.0 03/25/2024   PLT 279 03/25/2024   MCHC 32.9 03/25/2024   RDW 15.6 (H) 03/25/2024   LYMPHSABS 1.9 03/25/2024   MONOABS 0.5 03/25/2024   EOSABS 0.3 03/25/2024   BASOSABS 0.0 03/25/2024     Last metabolic panel Lab Results  Component Value Date   NA 139 04/07/2024   K 3.8 04/07/2024   CL 106 04/07/2024   CO2 21 (L) 04/07/2024   BUN 16 04/07/2024   CREATININE 0.82 04/07/2024   GLUCOSE 115 (H) 04/07/2024   GFRNONAA >60 04/07/2024   GFRAA >90 01/21/2014   CALCIUM 9.7 04/07/2024   PHOS 4.1 01/21/2024   PROT 6.7 03/15/2024   ALBUMIN 3.0 (L) 03/15/2024   LABGLOB 3.6 11/15/2023   BILITOT 0.6 03/15/2024   ALKPHOS 51 03/15/2024   AST 18 03/15/2024   ALT 27 03/15/2024   ANIONGAP 13 04/07/2024    CBG (last 3)  No results for input(s):  GLUCAP in the last 72 hours.    Coagulation Profile: No results for input(s): INR, PROTIME in the last 168 hours.   Radiology Studies: No results found.     Elgie Butter M.D. [facility] 04/08/2024, 5:09 PM  Available via Epic secure chat 7am-7pm After 7 pm, please refer to night coverage provider listed on amion.    "

## 2024-04-08 NOTE — Plan of Care (Signed)

## 2024-04-09 ENCOUNTER — Inpatient Hospital Stay (HOSPITAL_COMMUNITY)

## 2024-04-09 DIAGNOSIS — I10 Essential (primary) hypertension: Secondary | ICD-10-CM | POA: Diagnosis not present

## 2024-04-09 DIAGNOSIS — F1011 Alcohol abuse, in remission: Secondary | ICD-10-CM | POA: Diagnosis not present

## 2024-04-09 DIAGNOSIS — I610 Nontraumatic intracerebral hemorrhage in hemisphere, subcortical: Secondary | ICD-10-CM | POA: Diagnosis not present

## 2024-04-09 DIAGNOSIS — E440 Moderate protein-calorie malnutrition: Secondary | ICD-10-CM | POA: Diagnosis not present

## 2024-04-09 MED ORDER — IOHEXOL 350 MG/ML SOLN
75.0000 mL | Freq: Once | INTRAVENOUS | Status: AC | PRN
  Administered 2024-04-09: 75 mL via INTRAVENOUS

## 2024-04-09 NOTE — Progress Notes (Signed)
 "        [facility]                                                                               Amanda [name], is a 63 y.o. female, DOB - Feb 07, 1961, FMW:[id number] Admit date - 01/03/2024    Outpatient Primary MD for the patient is Patient, No Pcp Per  LOS - 97  days    Brief summary   Amanda [name] is a 63 y.o. female with a history of hypertension, heart failure, cocaine abuse,d diabetes mellitus type 2. Patient presented secondary to lethargy and near syncope, found to have significantly elevated blood pressure and CT evidence of acute intraparenchymal hemorrhage with intraventricular hemorrhage and hydrocephalus. Patient admitted to ICU for close blood pressure management and Neurosurgery consulted, performing a right frontal ventriculostomy. EVD eventually removed on 10/3.  PEG tube required on 10/16 and removed on 12/2. Occasional coughing. No new events.   Assessment & Plan    Assessment and Plan:   Acute right thalamic intraparenchymal hemorrhage Intraventricular hemorrhage Obstructive hydrocephalus In setting of cocaine use. 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 Neurosurgery consulted while the patient was in the ED, with neurosurgery recommendation for no emergent placement of a ventricular drain. Patient intubated and transferred to the ICU on admission. Blood pressure management with Cleviprex . Repeat CT head significant for increase in size of medial right thalamic hematoma. Patient underwent right frontal ventriculostomy on 9/22 which was eventual clamped on 10/1 and removed on 10/3.  Repeat CT head without contrast ordered to evaluate for resolution of intra cerebral bleed.     Acute metabolic encephalopathy Present on presentation and related to hydrocephalus and IVH.  She is pleasant, alert and oritented.     Chronic combined systolic and diastolic heart failure LVEF of 40% with associated grade  I diastolic dysfunction.  -Continue Coreg , spironolactone  and Entresto  - she appears euvolemic.    Acute respiratory failure with hypoxia Patient with worsening mental status and concern for inability to protect her airway on admission. Patient was intubated successfully on 9/22 and managed via mechanical ventilation. Patient extubated successfully on 9/23.  She is currently on RA, but has some cough.    Tachypnea Unclear etiology. Per patient, no dyspnea. Possibly related to aspiration in setting of dysphagia and tube feeds. Chest x-ray unremarkable. Patient remains with SpO2 of 100% on room air. Possibly anxiety related. VBG consistent with respiratory alkalosis. D-dimer negative. Symptoms resolved.   Primary hypertension Hypertensive emergency Hypertensive emergency in setting of stroke and severely elevated blood pressure. Patient managed on Cleviprex  drip while in ICU. Cleviprex  weaned off. -Continue amlodipine , Coreg , spironolactone  and Entresto . -- BP parameters are well controlled.    Diabetes mellitus type 2 Well controlled based on hemoglobin A1C of 6.6%. CBG (last 3)  No results for input(s): GLUCAP in the last 72 hours.  Cocaine abuse Noted. Patient with a history of cocaine use, most recently 3 hours prior to presentation. Cocaine positive on UDS.   Abnormal CT chest Imaging significant for right apical pleural scarring versus density. Recommendation for repeat CT scan in 3 months from 01/03/2024. Repeat CT chest today.  Dysphagia Moderate malnutrition Patient required PEG tube placement on 10/16 with initiation of tube feeds. Patient with improved oral intake and PEG tube removed on 12/2. -Dietitian recommendations (12/15): Continue current diet as ordered per SLP Encourage PO intake  Changed to ordering assistance Ensure Plus High Protein po TID, each supplement provides 350 kcal and 20 grams of protein MVI with minerals daily       Malnutrition  Type:  Nutrition Problem: Moderate Malnutrition Etiology: social / environmental circumstances (polysubstance abuse)   Malnutrition Characteristics:  Signs/Symptoms: mild muscle depletion, moderate fat depletion, mild fat depletion, moderate muscle depletion   Nutrition Interventions:  Interventions: Ensure Enlive (each supplement provides 350kcal and 20 grams of protein), MVI  Estimated body mass index is 20.47 kg/m as calculated from the following:   Height as of this encounter: 5' 5 (1.651 m).   Weight as of this encounter: 55.8 kg.  Code Status: full code.  DVT Prophylaxis:  Place and maintain sequential compression device Start: 01/03/24 0611 SCDs Start: 01/03/24 0429   Level of Care: Level of care: Med-Surg Family Communication: none at bedside.   Disposition Plan:     Remains inpatient appropriate:  pending placement.   Procedures:  None.   Consultants:   None.   Antimicrobials:   Anti-infectives (From admission, onward)    Start     Dose/Rate Route Frequency Ordered Stop   01/27/24 0807  ceFAZolin  (ANCEF ) IVPB 2g/100 mL premix        over 30 Minutes Intravenous Continuous PRN 01/27/24 0807 01/27/24 0807   01/13/24 0600  ceFAZolin  (ANCEF ) IVPB 2g/100 mL premix        2 g 200 mL/hr over 30 Minutes Intravenous On call to O.R. 01/11/24 2300 01/13/24 0557        Medications  Scheduled Meds:  carvedilol   25 mg Oral BID WC   feeding supplement  237 mL Oral TID BM   mineral oil-hydrophilic petrolatum    Topical BID   QUEtiapine   25 mg Oral QAC supper   sacubitril -valsartan   1 tablet Oral BID   senna-docusate  1 tablet Oral QHS   spironolactone   25 mg Oral Daily   Continuous Infusions: PRN Meds:.acetaminophen  **OR** acetaminophen  (TYLENOL ) oral liquid 160 mg/5 mL **OR** acetaminophen , benzonatate , hydrALAZINE , hydrALAZINE , ipratropium-albuterol , labetalol , menthol , OLANZapine , mouth rinse, polyethylene glycol, white petrolatum     Subjective:   Amanda [name] was seen and examined today.  No new complaints.   Vitals:   04/09/24 0103 04/09/24 0500 04/09/24 0535 04/09/24 0806  BP: 126/80  118/81 129/81  Pulse: 71  75 75  Resp: 16   14  Temp: 97.6 F (36.4 C)  98.5 F (36.9 C) 98.5 F (36.9 C)  TempSrc: Oral  Oral Oral  SpO2: 97%  95% 95%  Weight:  55.8 kg    Height:       No intake or output data in the 24 hours ending 04/09/24 1520 Filed Weights   04/07/24 0500 04/08/24 0500 04/09/24 0500  Weight: 56 kg 56.2 kg 55.8 kg     Exam General exam: Appears calm and comfortable  Respiratory system: air entry fair. On RA.  Cardiovascular system: S1 & S2 heard, RRR.  Gastrointestinal system: Abdomen is soft bs Central nervous system: Alert and oriented to place and person.  Extremities: no edema.  Skin: No rashes,  Psychiatry:Mood & affect appropriate.      Data Reviewed:  I have personally reviewed following labs and imaging studies   CBC Lab Results  Component Value Date   WBC 6.1 03/25/2024   RBC 4.10 03/25/2024   HGB 11.9 (L) 03/25/2024   HCT 36.2 03/25/2024   MCV 88.3 03/25/2024   MCH 29.0 03/25/2024   PLT 279 03/25/2024   MCHC 32.9 03/25/2024   RDW 15.6 (H) 03/25/2024   LYMPHSABS 1.9 03/25/2024   MONOABS 0.5 03/25/2024   EOSABS 0.3 03/25/2024   BASOSABS 0.0 03/25/2024     Last metabolic panel Lab Results  Component Value Date   NA 139 04/07/2024   K 3.8 04/07/2024   CL 106 04/07/2024   CO2 21 (L) 04/07/2024   BUN 16 04/07/2024   CREATININE 0.82 04/07/2024   GLUCOSE 115 (H) 04/07/2024   GFRNONAA >60 04/07/2024   GFRAA >90 01/21/2014   CALCIUM 9.7 04/07/2024   PHOS 4.1 01/21/2024   PROT 6.7 03/15/2024   ALBUMIN 3.0 (L) 03/15/2024   LABGLOB 3.6 11/15/2023   BILITOT 0.6 03/15/2024   ALKPHOS 51 03/15/2024   AST 18 03/15/2024   ALT 27 03/15/2024   ANIONGAP 13 04/07/2024    CBG (last 3)  No results for input(s): GLUCAP in the last 72 hours.    Coagulation Profile: No results for input(s):  INR, PROTIME in the last 168 hours.   Radiology Studies: No results found.     Elgie Butter M.D. [facility] 04/09/2024, 3:20 PM  Available via Epic secure chat 7am-7pm After 7 pm, please refer to night coverage provider listed on amion.    "

## 2024-04-09 NOTE — Plan of Care (Signed)

## 2024-04-10 DIAGNOSIS — F1011 Alcohol abuse, in remission: Secondary | ICD-10-CM | POA: Diagnosis not present

## 2024-04-10 DIAGNOSIS — E440 Moderate protein-calorie malnutrition: Secondary | ICD-10-CM | POA: Diagnosis not present

## 2024-04-10 DIAGNOSIS — I10 Essential (primary) hypertension: Secondary | ICD-10-CM | POA: Diagnosis not present

## 2024-04-10 DIAGNOSIS — I610 Nontraumatic intracerebral hemorrhage in hemisphere, subcortical: Secondary | ICD-10-CM | POA: Diagnosis not present

## 2024-04-10 MED ORDER — AZITHROMYCIN 500 MG PO TABS
500.0000 mg | ORAL_TABLET | Freq: Every day | ORAL | Status: AC
  Administered 2024-04-10 – 2024-04-14 (×5): 500 mg via ORAL
  Filled 2024-04-10 (×5): qty 1

## 2024-04-10 NOTE — Plan of Care (Signed)

## 2024-04-10 NOTE — Progress Notes (Signed)
 "        [facility]                                                                               [name], is a 63 y.o. female, DOB - November 30, 1960, FMW:[id number] Admit date - 01/03/2024    Outpatient Primary MD for the Amanda [name] is Amanda [name], No Pcp Per  LOS - 98  days    Brief summary   Amanda [name] is a 63 y.o. female with a history of hypertension, heart failure, cocaine abuse,d diabetes mellitus type 2. Amanda [name] presented secondary to lethargy and near syncope, found to have significantly elevated blood pressure and CT evidence of acute intraparenchymal hemorrhage with intraventricular hemorrhage and hydrocephalus. Amanda [name] admitted to ICU for close blood pressure management and Neurosurgery consulted, performing a right frontal ventriculostomy. EVD eventually removed on 10/3.  PEG tube required on 10/16 and removed on 12/2. Occasional coughing. No new events.   Assessment & Plan    Assessment and Plan:   Acute right thalamic intraparenchymal hemorrhage Intraventricular hemorrhage Obstructive hydrocephalus In setting of cocaine use. 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 Neurosurgery consulted while the Amanda [name] was in the ED, with neurosurgery recommendation for no emergent placement of a ventricular drain. Amanda [name] intubated and transferred to the ICU on admission. Blood pressure management with Cleviprex . Repeat CT head significant for increase in size of medial right thalamic hematoma. Amanda [name] underwent right frontal ventriculostomy on 9/22 which was eventual clamped on 10/1 and removed on 10/3.  Repeat CT head without contrast done showed, Subtle sequela of prior right thalamic and left basal ganglia hemorrhage. Remote left caudate lacunar infarct.    Acute metabolic encephalopathy Present on presentation and related to hydrocephalus and IVH.  She is pleasant, alert and oriented to person and place.  She is  impulse sometimes and requesting to go home.     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  -Continue Coreg , spironolactone  and Entresto  - she appears euvolemic.    Acute respiratory failure with hypoxia Amanda [name] with worsening mental status and concern for inability to protect her airway on admission. Amanda [name] was intubated successfully on 9/22 and managed via mechanical ventilation. Amanda [name] extubated successfully on 9/23.  She is currently on RA, but has some cough.  CT chest repeated showed Asymmetric increased ground-glass attenuation in the posterior right upper lobe. Favor nonspecific pneumonitis. Will add azithromycin  atypical pneumonitis.    Tachypnea Unclear etiology. Per Amanda [name], no dyspnea. Possibly related to aspiration in setting of dysphagia and tube feeds. Chest x-ray unremarkable. Amanda [name] remains with SpO2 of 100% on room air. Possibly anxiety related. VBG consistent with respiratory alkalosis. D-dimer negative. Symptoms resolved.   Primary hypertension Hypertensive emergency Hypertensive emergency in setting of stroke and severely elevated blood pressure. Amanda [name] managed on Cleviprex  drip while in ICU. Cleviprex  weaned off. -Continue amlodipine , Coreg , spironolactone  and Entresto . -- BP parameters are optimal.    Diabetes mellitus type 2 Well controlled based on hemoglobin A1C of 6.6%. CBG (last 3)  No results for input(s): GLUCAP in the last 72 hours.  Cocaine abuse Noted. Amanda [name] with a history of  cocaine use, most recently 3 hours prior to presentation. Cocaine positive on UDS.   Abnormal CT chest Imaging significant for right apical pleural scarring versus density. Recommendation for repeat CT scan in 3 months from 01/03/2024. Repeat CT chest showed  Interval resolution of the prior asymmetric density in the apical segment of the right upper lobe, compatible with an inflammatory or infectious  process. Asymmetric increased ground-glass attenuation in the posterior right upper lobe. Favor nonspecific pneumonitis.   4 mm left upper lobe lung nodule is unchanged. In a Amanda [name] who was at low risk, no further follow-up is indicated. If the Amanda [name] is at increased risk a follow-up CT of the chest without contrast material in 12 months may be considered.   Dysphagia Moderate malnutrition Amanda [name] required PEG tube placement on 10/16 with initiation of tube feeds. Amanda [name] with improved oral intake and PEG tube removed on 12/2. -Dietitian recommendations (12/15): Continue current diet as ordered per SLP Encourage PO intake  Changed to ordering assistance Ensure Plus High Protein po TID, each supplement provides 350 kcal and 20 grams of protein MVI with minerals daily       Malnutrition Type:  Nutrition Problem: Moderate Malnutrition Etiology: social / environmental circumstances (polysubstance abuse)   Malnutrition Characteristics:  Signs/Symptoms: mild muscle depletion, moderate fat depletion, mild fat depletion, moderate muscle depletion   Nutrition Interventions:  Interventions: Ensure Enlive (each supplement provides 350kcal and 20 grams of protein), MVI  Estimated body mass index is 20.47 kg/m as calculated from the following:   Height as of this encounter: 5' 5 (1.651 m).   Weight as of this encounter: 55.8 kg.  Code Status: full code.  DVT Prophylaxis:  Place and maintain sequential compression device Start: 01/03/24 0611 SCDs Start: 01/03/24 0429   Level of Care: Level of care: Med-Surg Family Communication: none at bedside. Called daughter to update, unable to reach her, left a voicemail.   Disposition Plan:     Remains inpatient appropriate:  pending placement.   Procedures:  None.   Consultants:   None.   Antimicrobials:   Anti-infectives (From admission, onward)    Start     Dose/Rate Route Frequency Ordered Stop   01/27/24 0807  ceFAZolin   (ANCEF ) IVPB 2g/100 mL premix        over 30 Minutes Intravenous Continuous PRN 01/27/24 0807 01/27/24 0807   01/13/24 0600  ceFAZolin  (ANCEF ) IVPB 2g/100 mL premix        2 g 200 mL/hr over 30 Minutes Intravenous On call to O.R. 01/11/24 2300 01/13/24 0557        Medications  Scheduled Meds:  carvedilol   25 mg Oral BID WC   feeding supplement  237 mL Oral TID BM   mineral oil-hydrophilic petrolatum    Topical BID   QUEtiapine   25 mg Oral QAC supper   sacubitril -valsartan   1 tablet Oral BID   senna-docusate  1 tablet Oral QHS   spironolactone   25 mg Oral Daily   Continuous Infusions: PRN Meds:.acetaminophen  **OR** acetaminophen  (TYLENOL ) oral liquid 160 mg/5 mL **OR** acetaminophen , benzonatate , hydrALAZINE , hydrALAZINE , ipratropium-albuterol , labetalol , menthol , OLANZapine , mouth rinse, polyethylene glycol, white petrolatum     Subjective:   Belicia Difatta was seen and examined today.  No new complaints.   Vitals:   04/09/24 0535 04/09/24 0806 04/09/24 1955 04/10/24 0813  BP: 118/81 129/81 102/78 119/83  Pulse: 75 75 84 74  Resp:  14 17 14   Temp: 98.5 F (36.9 C) 98.5 F (36.9 C) 98.4 F (36.9 C) 98.1  F (36.7 C)  TempSrc: Oral Oral Oral Oral  SpO2: 95% 95% 98% 95%  Weight:      Height:       No intake or output data in the 24 hours ending 04/10/24 1510 Filed Weights   04/07/24 0500 04/08/24 0500 04/09/24 0500  Weight: 56 kg 56.2 kg 55.8 kg     Exam General exam: Appears calm and comfortable  Respiratory system: Clear to auscultation. Respiratory effort normal. Cardiovascular system: S1 & S2 heard, RRR. No JVD, Gastrointestinal system: Abdomen is soft bs+ Central nervous system: Alert and oriented to person and place.  Extremities: no pedal edema.  Skin: No rashes,  Psychiatry: anxious to go home and impulsive.       Data Reviewed:  I have personally reviewed following labs and imaging studies   CBC Lab Results  Component Value Date   WBC 6.1  03/25/2024   RBC 4.10 03/25/2024   HGB 11.9 (L) 03/25/2024   HCT 36.2 03/25/2024   MCV 88.3 03/25/2024   MCH 29.0 03/25/2024   PLT 279 03/25/2024   MCHC 32.9 03/25/2024   RDW 15.6 (H) 03/25/2024   LYMPHSABS 1.9 03/25/2024   MONOABS 0.5 03/25/2024   EOSABS 0.3 03/25/2024   BASOSABS 0.0 03/25/2024     Last metabolic panel Lab Results  Component Value Date   NA 139 04/07/2024   K 3.8 04/07/2024   CL 106 04/07/2024   CO2 21 (L) 04/07/2024   BUN 16 04/07/2024   CREATININE 0.82 04/07/2024   GLUCOSE 115 (H) 04/07/2024   GFRNONAA >60 04/07/2024   GFRAA >90 01/21/2014   CALCIUM 9.7 04/07/2024   PHOS 4.1 01/21/2024   PROT 6.7 03/15/2024   ALBUMIN 3.0 (L) 03/15/2024   LABGLOB 3.6 11/15/2023   BILITOT 0.6 03/15/2024   ALKPHOS 51 03/15/2024   AST 18 03/15/2024   ALT 27 03/15/2024   ANIONGAP 13 04/07/2024    CBG (last 3)  No results for input(s): GLUCAP in the last 72 hours.    Coagulation Profile: No results for input(s): INR, PROTIME in the last 168 hours.   Radiology Studies: CT CHEST W CONTRAST Result Date: 04/10/2024 EXAM: CT CHEST WITH CONTRAST 04/09/2024 12:42:18 PM TECHNIQUE: CT of the chest was performed with the administration of 75 mL of iohexol  (OMNIPAQUE ) 350 MG/ML injection. Multiplanar reformatted images are provided for review. Automated exposure control, iterative reconstruction, and/or weight based adjustment of the mA/kV was utilized to reduce the radiation dose to as low as reasonably achievable. COMPARISON: 12/03/2023 CLINICAL HISTORY: apical scarring FINDINGS: MEDIASTINUM: Cardiac enlargement. Small pericardial effusion. Aortic atherosclerotic calcifications and coronary artery calcifications. The central airways are clear. LYMPH NODES: No mediastinal, hilar or axillary lymphadenopathy. LUNGS AND PLEURA: Emphysema. Diffuse bronchial wall thickening. Mild diffuse ground-glass attenuation is identified within both lungs with a few patchy areas of  subsegmental atelectasis noted in the lower lung zones. Asymmetric increased ground-glass attenuation is noted in the posterior right upper lobe, image 52/5. The previous asymmetric density in the apical segment of the right upper lobe has resolved in the interval, compatible with an inflammatory/infectious process. A solid nodule in the left upper lobe measures 4 mm, image 48/4. No pleural effusion or pneumothorax. SOFT TISSUES/BONES: Bosniak class 2 cyst arises off the upper pole of the left kidney measuring 2.6 x 3.0 cm is noted, image 155/3. No follow-up imaging recommended. No acute abnormality of the bones or soft tissues. UPPER ABDOMEN: Limited images of the upper abdomen demonstrates no acute abnormality. IMPRESSION:  1. Interval resolution of the prior asymmetric density in the apical segment of the right upper lobe, compatible with an inflammatory or infectious process. 2. Asymmetric increased ground-glass attenuation in the posterior right upper lobe. Favor nonspecific pneumonitis. 3. 4 mm left upper lobe lung nodule is unchanged. In a Amanda [name] who was at low risk, no further follow-up is indicated. If the Amanda [name] is at increased risk a follow-up CT of the chest without contrast material in 12 months may be considered 4. Emphysema 5. Aortic atherosclerosis and coronary artery calcification. 6. Cardiac enlargement and small pericardial effusion. Electronically signed by: Waddell Calk MD 04/10/2024 06:12 AM EST RP Workstation: [id number]   CT HEAD WO CONTRAST ( ) Result Date: 04/10/2024 EXAM: CT HEAD WITHOUT CONTRAST 04/09/2024 12:42:18 PM TECHNIQUE: CT of the head was performed without the administration of intravenous contrast. Automated exposure control, iterative reconstruction, and/or weight based adjustment of the mA/kV was utilized to reduce the radiation dose to as low as reasonably achievable. COMPARISON: CT Head Jan 20, 2024 CLINICAL HISTORY: Follow up on intracerebral hemorrhage. FINDINGS:  BRAIN AND VENTRICLES: No acute hemorrhage. Subtle mineralization in the right thalamus and left basal ganglia, compatible with the sequela of prior hemorrhage. No significant residual edema. No evidence of acute infarct. Remote left caudate lacunar infarct. No hydrocephalus. No extra-axial collection. No mass effect or midline shift. Cerebral atrophy. Calcific atherosclerosis. ORBITS: No acute abnormality. SINUSES: No acute abnormality. SOFT TISSUES AND SKULL: No acute soft tissue abnormality. No skull fracture. IMPRESSION: 1. No acute intracranial abnormality. 2. Subtle sequela of prior right thalamic and left basal ganglia hemorrhage. 3. Remote left caudate lacunar infarct. Electronically signed by: Gilmore Molt 04/10/2024 02:16 AM EST RP Workstation: [id number]       Elgie Butter M.D. [facility] 04/10/2024, 3:10 PM  Available via Epic secure chat 7am-7pm After 7 pm, please refer to night coverage provider listed on amion.    "

## 2024-04-10 NOTE — Plan of Care (Signed)
   Problem: Education: Goal: Ability to describe self-care measures that may prevent or decrease complications (Diabetes Survival Skills Education) will improve Outcome: Progressing   Problem: Coping: Goal: Ability to adjust to condition or change in health will improve Outcome: Progressing   Problem: Fluid Volume: Goal: Ability to maintain a balanced intake and output will improve Outcome: Progressing

## 2024-04-11 DIAGNOSIS — I610 Nontraumatic intracerebral hemorrhage in hemisphere, subcortical: Secondary | ICD-10-CM | POA: Diagnosis not present

## 2024-04-11 DIAGNOSIS — F1011 Alcohol abuse, in remission: Secondary | ICD-10-CM | POA: Diagnosis not present

## 2024-04-11 DIAGNOSIS — E440 Moderate protein-calorie malnutrition: Secondary | ICD-10-CM | POA: Diagnosis not present

## 2024-04-11 DIAGNOSIS — I10 Essential (primary) hypertension: Secondary | ICD-10-CM | POA: Diagnosis not present

## 2024-04-11 NOTE — Plan of Care (Signed)
" °  Problem: Education: Goal: Ability to describe self-care measures that may prevent or decrease complications (Diabetes Survival Skills Education) will improve 04/11/2024 0005 by Ralston Venus K, RN Outcome: Progressing 04/10/2024 2117 by Ranita Stjulien K, RN Outcome: Progressing   Problem: Coping: Goal: Ability to adjust to condition or change in health will improve 04/11/2024 0005 by Armany Mano K, RN Outcome: Progressing 04/10/2024 2117 by Merilyn Merlynn POUR, RN Outcome: Progressing   Problem: Fluid Volume: Goal: Ability to maintain a balanced intake and output will improve 04/11/2024 0005 by Londen Bok K, RN Outcome: Progressing 04/10/2024 2117 by Jaylon Grode K, RN Outcome: Progressing   "

## 2024-04-11 NOTE — Progress Notes (Signed)
 "        [facility]                                                                               Amanda [name], is a 63 y.o. female, DOB - 1960-09-18, FMW:[id number] Admit date - 01/03/2024    Outpatient Primary MD for the Amanda [name] is Amanda [name], No Pcp Per  LOS - 99  days    Brief summary   Amanda [name] is a 63 y.o. female with a history of hypertension, heart failure, cocaine abuse,d diabetes mellitus type 2. Amanda [name] presented secondary to lethargy and near syncope, found to have significantly elevated blood pressure and CT evidence of acute intraparenchymal hemorrhage with intraventricular hemorrhage and hydrocephalus. Amanda [name] admitted to ICU for close blood pressure management and Neurosurgery consulted, performing a right frontal ventriculostomy. EVD eventually removed on 10/3.  PEG tube required on 10/16 and removed on 12/2. Occasional coughing. No new events.  Discussed the results of CT head and CT chest with the Amanda [name], daughter over the phone and son at bedside.  Encouraged to work with PT for safe disposition.   Assessment & Plan    Assessment and Plan:   Acute right thalamic intraparenchymal hemorrhage Intraventricular hemorrhage Obstructive hydrocephalus In setting of cocaine use. 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 Neurosurgery consulted while the Amanda [name] was in the ED, with neurosurgery recommendation for no emergent placement of a ventricular drain. Amanda [name] intubated and transferred to the ICU on admission. Blood pressure management with Cleviprex . Repeat CT head significant for increase in size of medial right thalamic hematoma. Amanda [name] underwent right frontal ventriculostomy on 9/22 which was eventual clamped on 10/1 and removed on 10/3.  Repeat CT head without contrast done showed, Subtle sequela of prior right thalamic and left basal ganglia hemorrhage. Remote left caudate lacunar infarct. No new  events.  She is refusing scd's, should be safe to use lovenox  inj for DVT prophylaxis now that her repeat CT head does not show any hemorrhage.     Acute metabolic encephalopathy Present on presentation and related to hydrocephalus and IVH.  She is pleasant, alert and oriented to person and place.  She is impulse sometimes and requesting to go home.     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  -Continue Coreg , spironolactone  and Entresto  - she appears euvolemic.    Acute respiratory failure with hypoxia Amanda [name] with worsening mental status and concern for inability to protect her airway on admission. Amanda [name] was intubated successfully on 9/22 and managed via mechanical ventilation. Amanda [name] extubated successfully on 9/23.  She is currently on RA, but has some cough.  CT chest repeated showed Asymmetric increased ground-glass attenuation in the posterior right upper lobe. Favor nonspecific pneumonitis. Will add azithromycin  atypical pneumonitis to complete a 5 day course.    Tachypnea Unclear etiology. Per Amanda [name], no dyspnea. Possibly related to aspiration in setting of dysphagia and tube feeds. Chest x-ray unremarkable. Amanda [name] remains with SpO2 of 100% on room air. Possibly anxiety related. VBG consistent with respiratory alkalosis. D-dimer negative. Symptoms resolved.   Primary hypertension Hypertensive emergency Hypertensive emergency in setting of  stroke and severely elevated blood pressure. Amanda [name] managed on Cleviprex  drip while in ICU. Cleviprex  weaned off. -Continue Coreg , spironolactone  and Entresto . -- BP parameters are well controlled.    Diabetes mellitus type 2 Well controlled based on hemoglobin A1C of 6.6%. CBG (last 3)  No results for input(s): GLUCAP in the last 72 hours.  Cocaine abuse Noted. Amanda [name] with a history of cocaine use, most recently 3 hours prior to presentation. Cocaine  positive on UDS.   Abnormal CT chest Imaging significant for right apical pleural scarring versus density. Recommendation for repeat CT scan in 3 months from 01/03/2024. Repeat CT chest showed  Interval resolution of the prior asymmetric density in the apical segment of the right upper lobe, compatible with an inflammatory or infectious process. Asymmetric increased ground-glass attenuation in the posterior right upper lobe. Favor nonspecific pneumonitis.   4 mm left upper lobe lung nodule is unchanged. In a Amanda [name] who was at low risk, no further follow-up is indicated. If the Amanda [name] is at increased risk a follow-up CT of the chest without contrast material in 12 months may be considered.   Dysphagia Moderate malnutrition Amanda [name] required PEG tube placement on 10/16 with initiation of tube feeds. Amanda [name] with improved oral intake and PEG tube removed on 12/2. -Dietitian recommendations (12/15): Continue current diet as ordered per SLP Encourage PO intake  Changed to ordering assistance Ensure Plus High Protein po TID, each supplement provides 350 kcal and 20 grams of protein MVI with minerals daily       Malnutrition Type:  Nutrition Problem: Moderate Malnutrition Etiology: social / environmental circumstances (polysubstance abuse)   Malnutrition Characteristics:  Signs/Symptoms: mild muscle depletion, moderate fat depletion, mild fat depletion, moderate muscle depletion   Nutrition Interventions:  Interventions: Ensure Enlive (each supplement provides 350kcal and 20 grams of protein), MVI  Estimated body mass index is 20.54 kg/m as calculated from the following:   Height as of this encounter: 5' 5 (1.651 m).   Weight as of this encounter: 56 kg.  Code Status: full code.  DVT Prophylaxis:  Place and maintain sequential compression device Start: 01/03/24 0611 SCDs Start: 01/03/24 0429   Level of Care: Level of care: Med-Surg Family Communication: son at bedside.    Disposition Plan:     Remains inpatient appropriate:  waiting for safe disposition.   Procedures:  None.   Consultants:   None.   Antimicrobials:   Anti-infectives (From admission, onward)    Start     Dose/Rate Route Frequency Ordered Stop   04/10/24 1600  azithromycin  (ZITHROMAX ) tablet 500 mg        500 mg Oral Daily 04/10/24 1510 04/15/24 0959   01/27/24 0807  ceFAZolin  (ANCEF ) IVPB 2g/100 mL premix        over 30 Minutes Intravenous Continuous PRN 01/27/24 0807 01/27/24 0807   01/13/24 0600  ceFAZolin  (ANCEF ) IVPB 2g/100 mL premix        2 g 200 mL/hr over 30 Minutes Intravenous On call to O.R. 01/11/24 2300 01/13/24 0557        Medications  Scheduled Meds:  azithromycin   500 mg Oral Daily   carvedilol   25 mg Oral BID WC   feeding supplement  237 mL Oral TID BM   mineral oil-hydrophilic petrolatum    Topical BID   QUEtiapine   25 mg Oral QAC supper   sacubitril -valsartan   1 tablet Oral BID   senna-docusate  1 tablet Oral QHS   spironolactone   25 mg Oral Daily  Continuous Infusions: PRN Meds:.acetaminophen  **OR** acetaminophen  (TYLENOL ) oral liquid 160 mg/5 mL **OR** acetaminophen , benzonatate , hydrALAZINE , hydrALAZINE , ipratropium-albuterol , labetalol , menthol , OLANZapine , mouth rinse, polyethylene glycol, white petrolatum     Subjective:   Icyss Skog was seen and examined today.  No events today.   Vitals:   04/10/24 1948 04/11/24 0817 04/11/24 1203 04/11/24 1616  BP: 104/73 (!) 138/90 (!) 145/88 (!) 149/96  Pulse: 70 74 80 72  Resp: 17 16 20 18   Temp: (!) 97.5 F (36.4 C) 98.2 F (36.8 C) 97.9 F (36.6 C) 97.8 F (36.6 C)  TempSrc: Oral Oral Oral Oral  SpO2: 97% 95% 96% 95%  Weight:      Height:        Intake/Output Summary (Last 24 hours) at 04/11/2024 1744 Last data filed at 04/11/2024 1030 Gross per 24 hour  Intake 597 ml  Output --  Net 597 ml   Filed Weights   04/08/24 0500 04/09/24 0500 04/10/24 0813  Weight: 56.2 kg 55.8 kg 56  kg     Exam General exam: Appears calm and comfortable  Respiratory system: Clear to auscultation. Respiratory effort normal. Cardiovascular system: S1 & S2 heard, RRR. No JVD, Gastrointestinal system: Abdomen is nondistended, soft and nontender.  Central nervous system: Alert and oriented. No focal neurological deficits. Extremities: Symmetric 5 x 5 power. Skin: No rashes,  Psychiatry: Mood & affect appropriate.      Data Reviewed:  I have personally reviewed following labs and imaging studies   CBC Lab Results  Component Value Date   WBC 6.1 03/25/2024   RBC 4.10 03/25/2024   HGB 11.9 (L) 03/25/2024   HCT 36.2 03/25/2024   MCV 88.3 03/25/2024   MCH 29.0 03/25/2024   PLT 279 03/25/2024   MCHC 32.9 03/25/2024   RDW 15.6 (H) 03/25/2024   LYMPHSABS 1.9 03/25/2024   MONOABS 0.5 03/25/2024   EOSABS 0.3 03/25/2024   BASOSABS 0.0 03/25/2024     Last metabolic panel Lab Results  Component Value Date   NA 139 04/07/2024   K 3.8 04/07/2024   CL 106 04/07/2024   CO2 21 (L) 04/07/2024   BUN 16 04/07/2024   CREATININE 0.82 04/07/2024   GLUCOSE 115 (H) 04/07/2024   GFRNONAA >60 04/07/2024   GFRAA >90 01/21/2014   CALCIUM 9.7 04/07/2024   PHOS 4.1 01/21/2024   PROT 6.7 03/15/2024   ALBUMIN 3.0 (L) 03/15/2024   LABGLOB 3.6 11/15/2023   BILITOT 0.6 03/15/2024   ALKPHOS 51 03/15/2024   AST 18 03/15/2024   ALT 27 03/15/2024   ANIONGAP 13 04/07/2024    CBG (last 3)  No results for input(s): GLUCAP in the last 72 hours.    Coagulation Profile: No results for input(s): INR, PROTIME in the last 168 hours.   Radiology Studies: No results found.      Elgie Butter M.D. [facility] 04/11/2024, 5:44 PM  Available via Epic secure chat 7am-7pm After 7 pm, please refer to night coverage provider listed on amion.    "

## 2024-04-12 DIAGNOSIS — I610 Nontraumatic intracerebral hemorrhage in hemisphere, subcortical: Secondary | ICD-10-CM | POA: Diagnosis not present

## 2024-04-12 NOTE — Progress Notes (Signed)
 Pt adamantly refused SCD's even after explaining need for use.  Dr. Cherlyn notified.

## 2024-04-12 NOTE — Progress Notes (Signed)
 " PROGRESS NOTE    Amanda [name]  FMW:[id number] DOB: 1960/12/03 DOA: 01/03/2024 PCP: Patient, No Pcp Per  Subjective: No acute complaints today     Hospital Course: Amanda [name] is a 63 y.o. female with a history of hypertension, heart failure, cocaine abuse,d diabetes mellitus type 2. Patient presented secondary to lethargy and near syncope, found to have significantly elevated blood pressure and CT evidence of acute intraparenchymal hemorrhage with intraventricular hemorrhage and hydrocephalus. Patient admitted to ICU for close blood pressure management and Neurosurgery consulted, performing a right frontal ventriculostomy. EVD eventually removed on 10/3.  PEG tube required on 10/16 and removed on 12/2.   Assessment and Plan:  Acute right thalamic intraparenchymal hemorrhage Intraventricular hemorrhage Obstructive hydrocephalus In setting of cocaine use. 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 Neurosurgery consulted while the patient was in the ED, with neurosurgery recommendation for no emergent placement of a ventricular drain. Patient intubated and transferred to the ICU on admission. Blood pressure management with Cleviprex . Repeat CT head significant for increase in size of medial right thalamic hematoma. Patient underwent right frontal ventriculostomy on 9/22 which was eventual clamped on 10/1 and removed on 10/3.  Repeat CT head without contrast done showed subtle sequela of prior right thalamic and left basal ganglia hemorrhage. Remote left caudate lacunar infarct. No new events.  She is refusing scd's, will consider lovenox  injections now that her CT is negative for hemorrhage    Acute metabolic encephalopathy Present on presentation and related to hydrocephalus and IVH.  She is pleasant, alert and oriented to person and place.  She is impulsive at times    Chronic combined systolic and diastolic heart failure LVEF of  40% with associated grade I diastolic dysfunction.  -Continue Coreg , spironolactone  and Entresto  - she appears euvolemic.    Acute respiratory failure with hypoxia Patient with worsening mental status and concern for inability to protect her airway on admission. Patient was intubated successfully on 9/22 and managed via mechanical ventilation. Patient extubated successfully on 9/23.  She is currently on RA, but has some cough.  CT chest repeated on 12/28 showed Asymmetric increased ground-glass attenuation in the posterior right upper lobe. Favor nonspecific pneumonitis. - continue azithromycin  for atypical pneumonitis to complete a 5 day course.    Tachypnea Unclear etiology. Per patient, no dyspnea. Possibly related to aspiration in setting of dysphagia and tube feeds. Chest x-ray unremarkable. Patient remains with SpO2 of 100% on room air. Possibly anxiety related. VBG consistent with respiratory alkalosis. D-dimer negative. Symptoms resolved.   Primary hypertension Hypertensive emergency Hypertensive emergency in setting of stroke and severely elevated blood pressure. Patient managed on Cleviprex  drip while in ICU. Cleviprex  weaned off. -Continue Coreg , spironolactone  and Entresto . -- BP parameters are well controlled.    Diabetes mellitus type 2 Well controlled based on hemoglobin A1C of 6.6%.   Cocaine abuse Noted. Patient with a history of cocaine use, most recently 3 hours prior to presentation. Cocaine positive on UDS.   Abnormal CT chest Imaging significant for right apical pleural scarring versus density. Recommendation for repeat CT scan in 3 months from 01/03/2024. Repeat CT chest showed  Interval resolution of the prior asymmetric density in the apical segment of the right upper lobe, compatible with an inflammatory or infectious process. Asymmetric increased ground-glass attenuation in the posterior right upper lobe. Favor nonspecific pneumonitis. 4 mm left upper lobe lung  nodule is unchanged. In a patient who was at low risk, no further follow-up  is indicated. If the patient is at increased risk a follow-up CT of the chest without contrast material in 12 months may be considered. - on azithromycin  as above     Dysphagia Moderate malnutrition Patient required PEG tube placement on 10/16 with initiation of tube feeds. Patient with improved oral intake and PEG tube removed on 12/2. -Dietitian recommendations (12/15): Continue current diet as ordered per SLP Encourage PO intake  Changed to ordering assistance Ensure Plus High Protein po TID, each supplement provides 350 kcal and 20 grams of protein MVI with minerals daily      DVT prophylaxis: Place and maintain sequential compression device Start: 01/03/24 0611 SCDs Start: 01/03/24 0429    Code Status: Full Code Disposition Plan: TBD Reason for continuing need for hospitalization: pending placement   Objective: Vitals:   04/11/24 2017 04/12/24 0004 04/12/24 0500 04/12/24 0726  BP: 97/70 114/73  105/82  Pulse: 71 70  74  Resp: 16 20  19   Temp: 97.9 F (36.6 C) 98 F (36.7 C)  97.9 F (36.6 C)  TempSrc: Oral Oral  Oral  SpO2: 94% 98%  94%  Weight:   56.1 kg   Height:        Intake/Output Summary (Last 24 hours) at 04/12/2024 1626 Last data filed at 04/12/2024 0930 Gross per 24 hour  Intake 270 ml  Output 0 ml  Net 270 ml   Filed Weights   04/09/24 0500 04/10/24 0813 04/12/24 0500  Weight: 55.8 kg 56 kg 56.1 kg    Examination:  Physical Exam Vitals and nursing note reviewed.  Constitutional:      General: She is not in acute distress. Cardiovascular:     Rate and Rhythm: Normal rate.  Pulmonary:     Effort: No respiratory distress.  Abdominal:     General: There is no distension.  Musculoskeletal:     Right lower leg: No edema.     Left lower leg: No edema.     Data Reviewed: I have personally reviewed following labs and imaging studies  CBC: No results for input(s):  WBC, NEUTROABS, HGB, HCT, MCV, PLT in the last 168 hours. Basic Metabolic Panel: Recent Labs  Lab 04/07/24 1459  NA 139  K 3.8  CL 106  CO2 21*  GLUCOSE 115*  BUN 16  CREATININE 0.82  CALCIUM 9.7   GFR: Estimated Creatinine Clearance: 63 mL/min (by C-G formula based on SCr of 0.82 mg/dL). Liver Function Tests: No results for input(s): AST, ALT, ALKPHOS, BILITOT, PROT, ALBUMIN in the last 168 hours. No results for input(s): LIPASE, AMYLASE in the last 168 hours. No results for input(s): AMMONIA in the last 168 hours. Coagulation Profile: No results for input(s): INR, PROTIME in the last 168 hours. Cardiac Enzymes: No results for input(s): CKTOTAL, CKMB, CKMBINDEX, TROPONINI in the last 168 hours. ProBNP, BNP (last 5 results) Recent Labs    05/12/23 0553 01/03/24 0145  BNP 1,407.8* 370.8*   HbA1C: No results for input(s): HGBA1C in the last 72 hours. CBG: No results for input(s): GLUCAP in the last 168 hours. Lipid Profile: No results for input(s): CHOL, HDL, LDLCALC, TRIG, CHOLHDL, LDLDIRECT in the last 72 hours. Thyroid Function Tests: No results for input(s): TSH, T4TOTAL, FREET4, T3FREE, THYROIDAB in the last 72 hours. Anemia Panel: No results for input(s): VITAMINB12, FOLATE, FERRITIN, TIBC, IRON, RETICCTPCT in the last 72 hours. Sepsis Labs: No results for input(s): PROCALCITON, LATICACIDVEN in the last 168 hours.  No results found for this or  any previous visit (from the past 240 hours).   Radiology Studies: No results found.  Scheduled Meds:  azithromycin   500 mg Oral Daily   carvedilol   25 mg Oral BID WC   feeding supplement  237 mL Oral TID BM   mineral oil-hydrophilic petrolatum    Topical BID   QUEtiapine   25 mg Oral QAC supper   sacubitril -valsartan   1 tablet Oral BID   senna-docusate  1 tablet Oral QHS   spironolactone   25 mg Oral Daily   Continuous Infusions:    LOS: 100 days   Time spent: 37 minutes  Casimer Dare, MD  [facility]  04/12/2024, 4:26 PM   "

## 2024-04-12 NOTE — Plan of Care (Signed)
" °  Problem: Coping: Goal: Ability to adjust to condition or change in health will improve Outcome: Progressing   Problem: Nutritional: Goal: Maintenance of adequate nutrition will improve Outcome: Progressing   Problem: Education: Goal: Knowledge of disease or condition will improve Outcome: Progressing   Problem: Intracerebral Hemorrhage Tissue Perfusion: Goal: Complications of Intracerebral Hemorrhage will be minimized 04/12/2024 0724 by Wallace Izetta PARAS, RN Outcome: Progressing 04/11/2024 2211 by Wallace Izetta PARAS, RN Outcome: Progressing   Problem: Self-Care: Goal: Ability to communicate needs accurately will improve 04/12/2024 0724 by Wallace Izetta PARAS, RN Outcome: Progressing 04/11/2024 2211 by Wallace Izetta PARAS, RN Outcome: Progressing   Problem: Self-Care: Goal: Ability to participate in self-care as condition permits will improve 04/12/2024 0724 by Wallace Izetta PARAS, RN Outcome: Progressing 04/11/2024 2211 by Wallace Izetta PARAS, RN Outcome: Progressing   "

## 2024-04-13 DIAGNOSIS — I610 Nontraumatic intracerebral hemorrhage in hemisphere, subcortical: Secondary | ICD-10-CM | POA: Diagnosis not present

## 2024-04-13 MED ORDER — ENOXAPARIN SODIUM 40 MG/0.4ML IJ SOSY
40.0000 mg | PREFILLED_SYRINGE | INTRAMUSCULAR | Status: DC
  Filled 2024-04-13 (×3): qty 0.4

## 2024-04-13 NOTE — Progress Notes (Signed)
 " PROGRESS NOTE    Amanda [name]  FMW:[id number] DOB: 1960-07-23 DOA: 01/03/2024 PCP: Patient, No Pcp Per  Subjective: Patient has no acute complaints today. States she has a girlfriend who was homeless but is living in her home and can provide assistance. Asking if she can go home     Hospital Course: Amanda [name] is a 64 y.o. female with a history of hypertension, heart failure, cocaine abuse,d diabetes mellitus type 2. Patient presented secondary to lethargy and near syncope, found to have significantly elevated blood pressure and CT evidence of acute intraparenchymal hemorrhage with intraventricular hemorrhage and hydrocephalus. Patient admitted to ICU for close blood pressure management and Neurosurgery consulted, performing a right frontal ventriculostomy. EVD eventually removed on 10/3.  PEG tube required on 10/16 and removed on 12/2.   Assessment and Plan:  Acute right thalamic intraparenchymal hemorrhage Intraventricular hemorrhage Obstructive hydrocephalus In setting of cocaine use. 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 Neurosurgery consulted no emergent placement of a ventricular drain was recommended. Patient intubated and transferred to the ICU on admission. Blood pressure management with Cleviprex . Repeat CT head significant for increase in size of medial right thalamic hematoma. Patient underwent right frontal ventriculostomy on 9/22 which was eventual clamped on 10/1 and removed on 10/3.  Repeat CT head without contrast done showed subtle sequela of prior right thalamic and left basal ganglia hemorrhage. Remote left caudate lacunar infarct. She is refusing scd's, will consider lovenox  injections now that her repeat CT head (12/28) is negative for hemorrhage    Acute metabolic encephalopathy Present on presentation and related to hydrocephalus and IVH.  She is alert and oriented to person and place.  She is impulsive  at times    Chronic combined systolic and diastolic heart failure LVEF of 40% with associated grade I diastolic dysfunction.  -Continue Coreg , spironolactone  and Entresto  - she appears euvolemic.    Acute respiratory failure with hypoxia Patient with worsening mental status and concern for inability to protect her airway on admission. Patient was intubated successfully on 9/22 and managed via mechanical ventilation. Patient extubated successfully on 9/23.  She is currently on RA, but has some cough.  CT chest repeated on 12/28 showed Asymmetric increased ground-glass attenuation in the posterior right upper lobe. Favor nonspecific pneumonitis. - continue azithromycin  for atypical pneumonitis to complete a 5 day course.    Tachypnea Unclear etiology. Per patient, no dyspnea. Possibly related to aspiration in setting of dysphagia and tube feeds. Chest x-ray unremarkable. Patient remains with SpO2 of 100% on room air. Possibly anxiety related. VBG consistent with respiratory alkalosis. D-dimer negative. Symptoms resolved.   Primary hypertension Hypertensive emergency Hypertensive emergency in setting of stroke and severely elevated blood pressure. Patient managed on Cleviprex  drip while in ICU. Cleviprex  weaned off. -Continue Coreg , spironolactone  and Entresto . -- BP parameters are well controlled.    Diabetes mellitus type 2 Well controlled based on hemoglobin A1C of 6.6%.   Cocaine abuse Patient with a history of cocaine use, most recently 3 hours prior to presentation. Cocaine positive on UDS.   Abnormal CT chest Imaging significant for right apical pleural scarring versus density. Recommendation for repeat CT scan in 3 months from 01/03/2024. Repeat CT chest showed  Interval resolution of the prior asymmetric density in the apical segment of the right upper lobe, compatible with an inflammatory or infectious process. Asymmetric increased ground-glass attenuation in the posterior right  upper lobe. Favor nonspecific pneumonitis. 4 mm left upper lobe lung  nodule is unchanged. In a patient who was at low risk, no further follow-up is indicated. If the patient is at increased risk a follow-up CT of the chest without contrast material in 12 months may be considered. - on azithromycin  as above     Dysphagia Moderate malnutrition Patient required PEG tube placement on 10/16 with initiation of tube feeds. Patient with improved oral intake and PEG tube removed on 12/2. -Dietitian recommendations (12/15): Continue current diet as ordered per SLP Encourage PO intake  Changed to ordering assistance Ensure Plus High Protein po TID, each supplement provides 350 kcal and 20 grams of protein MVI with minerals daily      DVT prophylaxis: Place and maintain sequential compression device Start: 01/03/24 0611 SCDs Start: 01/03/24 0429    Code Status: Full Code Disposition Plan: SNF Reason for continuing need for hospitalization: Bed offer  Objective: Vitals:   04/12/24 1702 04/12/24 1951 04/13/24 0500 04/13/24 0823  BP: (!) 133/98 113/78  109/76  Pulse: 84 77  75  Resp: 18 17  16   Temp: 98.1 F (36.7 C) (!) 97.5 F (36.4 C)  97.9 F (36.6 C)  TempSrc: Oral Oral  Oral  SpO2: 95% 94%    Weight:   53.8 kg   Height:        Intake/Output Summary (Last 24 hours) at 04/13/2024 1602 Last data filed at 04/13/2024 0600 Gross per 24 hour  Intake 150 ml  Output --  Net 150 ml   Filed Weights   04/10/24 0813 04/12/24 0500 04/13/24 0500  Weight: 56 kg 56.1 kg 53.8 kg    Examination:  Physical Exam Vitals and nursing note reviewed.  Constitutional:      General: She is not in acute distress. Cardiovascular:     Rate and Rhythm: Normal rate.  Pulmonary:     Effort: No respiratory distress.  Abdominal:     General: There is no distension.  Musculoskeletal:     Right lower leg: No edema.     Left lower leg: No edema.     Data Reviewed: I have personally reviewed  following labs and imaging studies  CBC: No results for input(s): WBC, NEUTROABS, HGB, HCT, MCV, PLT in the last 168 hours. Basic Metabolic Panel: Recent Labs  Lab 04/07/24 1459  NA 139  K 3.8  CL 106  CO2 21*  GLUCOSE 115*  BUN 16  CREATININE 0.82  CALCIUM 9.7   GFR: Estimated Creatinine Clearance: 60.4 mL/min (by C-G formula based on SCr of 0.82 mg/dL). Liver Function Tests: No results for input(s): AST, ALT, ALKPHOS, BILITOT, PROT, ALBUMIN in the last 168 hours. No results for input(s): LIPASE, AMYLASE in the last 168 hours. No results for input(s): AMMONIA in the last 168 hours. Coagulation Profile: No results for input(s): INR, PROTIME in the last 168 hours. Cardiac Enzymes: No results for input(s): CKTOTAL, CKMB, CKMBINDEX, TROPONINI in the last 168 hours. ProBNP, BNP (last 5 results) Recent Labs    05/12/23 0553 01/03/24 0145  BNP 1,407.8* 370.8*   HbA1C: No results for input(s): HGBA1C in the last 72 hours. CBG: No results for input(s): GLUCAP in the last 168 hours. Lipid Profile: No results for input(s): CHOL, HDL, LDLCALC, TRIG, CHOLHDL, LDLDIRECT in the last 72 hours. Thyroid Function Tests: No results for input(s): TSH, T4TOTAL, FREET4, T3FREE, THYROIDAB in the last 72 hours. Anemia Panel: No results for input(s): VITAMINB12, FOLATE, FERRITIN, TIBC, IRON, RETICCTPCT in the last 72 hours. Sepsis Labs: No results for input(s):  PROCALCITON, LATICACIDVEN in the last 168 hours.  No results found for this or any previous visit (from the past 240 hours).   Radiology Studies: No results found.  Scheduled Meds:  azithromycin   500 mg Oral Daily   carvedilol   25 mg Oral BID WC   feeding supplement  237 mL Oral TID BM   mineral oil-hydrophilic petrolatum    Topical BID   QUEtiapine   25 mg Oral QAC supper   sacubitril -valsartan   1 tablet Oral BID   senna-docusate  1 tablet Oral  QHS   spironolactone   25 mg Oral Daily   Continuous Infusions:   LOS: 101 days   Time spent: 36 minutes  Casimer Dare, MD  [facility]  04/13/2024, 4:02 PM   "

## 2024-04-13 NOTE — Progress Notes (Signed)
 Patient wants Amanda [name] number added to the chart; friend she wants to go home with; states the number is [phone].

## 2024-04-14 DIAGNOSIS — I610 Nontraumatic intracerebral hemorrhage in hemisphere, subcortical: Secondary | ICD-10-CM | POA: Diagnosis not present

## 2024-04-14 NOTE — Progress Notes (Signed)
 " PROGRESS NOTE    Amanda [name]  FMW:[id number] DOB: 06-Jul-1960 DOA: 01/03/2024 PCP: Patient, No Pcp Per  Subjective: No acute overnight events. Patient asking again if she can go home with her friend. Discussed with case management   Hospital Course: Amanda [name] is a 64 y.o. female with a history of hypertension, heart failure, cocaine abuse,d diabetes mellitus type 2. Patient presented secondary to lethargy and near syncope, found to have significantly elevated blood pressure and CT evidence of acute intraparenchymal hemorrhage with intraventricular hemorrhage and hydrocephalus. Patient admitted to ICU for close blood pressure management and Neurosurgery consulted, performing a right frontal ventriculostomy. EVD eventually removed on 10/3.  PEG tube required on 10/16 and removed on 12/2.   Assessment and Plan:  Acute right thalamic intraparenchymal hemorrhage Intraventricular hemorrhage Obstructive hydrocephalus In setting of cocaine use. 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 Neurosurgery consulted no emergent placement of a ventricular drain was recommended. Patient intubated and transferred to the ICU on admission. Blood pressure management with Cleviprex . Repeat CT head significant for increase in size of medial right thalamic hematoma. Patient underwent right frontal ventriculostomy on 9/22 which was eventual clamped on 10/1 and removed on 10/3.  Repeat CT head without contrast done showed subtle sequela of prior right thalamic and left basal ganglia hemorrhage. Remote left caudate lacunar infarct. She is refusing scd's, will consider lovenox  injections now that her repeat CT head (12/28) is negative for hemorrhage    Acute metabolic encephalopathy Present on presentation and related to hydrocephalus and IVH.  She is alert and oriented to person and place.  She is impulsive at times but has mostly been calm and cooperateive     Chronic combined systolic and diastolic heart failure LVEF of 40% with associated grade I diastolic dysfunction.  - Continue Coreg , spironolactone  and Entresto  - she appears euvolemic.    Acute respiratory failure with hypoxia Patient with worsening mental status and concern for inability to protect her airway on admission. Patient was intubated on 9/22 and managed via mechanical ventilation. Patient extubated successfully on 9/23.  She is currently on RA, but has some cough.  CT chest repeated on 12/28 showed Asymmetric increased ground-glass attenuation in the posterior right upper lobe. Favor nonspecific pneumonitis. - completed 5 days of azithromycin      Tachypnea Unclear etiology. Per patient, no dyspnea. Possibly related to aspiration in setting of dysphagia. Chest x-ray unremarkable. Patient remains with SpO2 of 100% on room air. Possibly anxiety related. VBG consistent with respiratory alkalosis. D-dimer negative. Symptoms resolved.   Primary hypertension Hypertensive emergency Hypertensive emergency in setting of stroke and severely elevated blood pressure. Patient managed on Cleviprex  drip while in ICU. Cleviprex  weaned off. -Continue Coreg , spironolactone  and Entresto . -- BP parameters are well controlled.    Diabetes mellitus type 2 Well controlled based on hemoglobin A1C of 6.6%.   Cocaine abuse Patient with a history of cocaine use, most recently 3 hours prior to presentation. Cocaine positive on UDS.   Abnormal CT chest Imaging significant for right apical pleural scarring versus density. Recommendation for repeat CT scan in 3 months from 01/03/2024. Repeat CT chest showed  Interval resolution of the prior asymmetric density in the apical segment of the right upper lobe, compatible with an inflammatory or infectious process. Asymmetric increased ground-glass attenuation in the posterior right upper lobe. Favor nonspecific pneumonitis. 4 mm left upper lobe lung nodule  is unchanged. In a patient who was at low risk, no further follow-up  is indicated. If the patient is at increased risk a follow-up CT of the chest without contrast material in 12 months may be considered. - completed azithromycin  as above    Dysphagia Moderate malnutrition Patient required PEG tube placement on 10/16 with initiation of tube feeds. Patient with improved oral intake and PEG tube removed on 12/2. -Dietitian recommendations (12/15): Continue current diet as ordered per SLP Encourage PO intake  Changed to ordering assistance Ensure Plus High Protein po TID, each supplement provides 350 kcal and 20 grams of protein MVI with minerals daily      DVT prophylaxis: Place and maintain sequential compression device Start: 04/13/24 2120 Place TED hose Start: 04/13/24 2120 enoxaparin  (LOVENOX ) injection 40 mg Start: 04/13/24 1700 Place and maintain sequential compression device Start: 01/03/24 0611 SCDs Start: 01/03/24 0429    Code Status: Full Code Family Communication: Daughter updated. She confirmed that patient has a friend who can stay with her but unclear how much assistance she can provide. Patient needs supervision, not leaving her alone, someone to cook/clean/manage her med  Disposition Plan: TBD Reason for continuing need for hospitalization: no bed offer   Objective: Vitals:   04/13/24 0823 04/13/24 1938 04/14/24 0500 04/14/24 0737  BP: 109/76 108/76  110/82  Pulse: 75 74  70  Resp: 16   17  Temp: 97.9 F (36.6 C) 99 F (37.2 C)  98.5 F (36.9 C)  TempSrc: Oral Oral  Oral  SpO2:  95%  95%  Weight:   53.1 kg   Height:        Intake/Output Summary (Last 24 hours) at 04/14/2024 1551 Last data filed at 04/14/2024 0600 Gross per 24 hour  Intake 290 ml  Output --  Net 290 ml   Filed Weights   04/12/24 0500 04/13/24 0500 04/14/24 0500  Weight: 56.1 kg 53.8 kg 53.1 kg    Examination:  Physical Exam Vitals and nursing note reviewed.  Constitutional:       General: She is not in acute distress. Cardiovascular:     Rate and Rhythm: Normal rate.  Pulmonary:     Effort: No respiratory distress.  Abdominal:     General: There is no distension.     Tenderness: There is no abdominal tenderness.  Musculoskeletal:     Right lower leg: No edema.     Left lower leg: No edema.     Data Reviewed: I have personally reviewed following labs and imaging studies  CBC: No results for input(s): WBC, NEUTROABS, HGB, HCT, MCV, PLT in the last 168 hours. Basic Metabolic Panel: No results for input(s): NA, K, CL, CO2, GLUCOSE, BUN, CREATININE, CALCIUM, MG, PHOS in the last 168 hours. GFR: Estimated Creatinine Clearance: 59.6 mL/min (by C-G formula based on SCr of 0.82 mg/dL). Liver Function Tests: No results for input(s): AST, ALT, ALKPHOS, BILITOT, PROT, ALBUMIN in the last 168 hours. No results for input(s): LIPASE, AMYLASE in the last 168 hours. No results for input(s): AMMONIA in the last 168 hours. Coagulation Profile: No results for input(s): INR, PROTIME in the last 168 hours. Cardiac Enzymes: No results for input(s): CKTOTAL, CKMB, CKMBINDEX, TROPONINI in the last 168 hours. ProBNP, BNP (last 5 results) Recent Labs    05/12/23 0553 01/03/24 0145  BNP 1,407.8* 370.8*   HbA1C: No results for input(s): HGBA1C in the last 72 hours. CBG: No results for input(s): GLUCAP in the last 168 hours. Lipid Profile: No results for input(s): CHOL, HDL, LDLCALC, TRIG, CHOLHDL, LDLDIRECT in the last  72 hours. Thyroid Function Tests: No results for input(s): TSH, T4TOTAL, FREET4, T3FREE, THYROIDAB in the last 72 hours. Anemia Panel: No results for input(s): VITAMINB12, FOLATE, FERRITIN, TIBC, IRON, RETICCTPCT in the last 72 hours. Sepsis Labs: No results for input(s): PROCALCITON, LATICACIDVEN in the last 168 hours.  No results found for this or any  previous visit (from the past 240 hours).   Radiology Studies: No results found.  Scheduled Meds:  carvedilol   25 mg Oral BID WC   enoxaparin  (LOVENOX ) injection  40 mg Subcutaneous Q24H   feeding supplement  237 mL Oral TID BM   mineral oil-hydrophilic petrolatum    Topical BID   QUEtiapine   25 mg Oral QAC supper   sacubitril -valsartan   1 tablet Oral BID   senna-docusate  1 tablet Oral QHS   spironolactone   25 mg Oral Daily   Continuous Infusions:   LOS: 102 days   Time spent: 36 minutes  Casimer Dare, MD  [facility]  04/14/2024, 3:51 PM   "

## 2024-04-14 NOTE — TOC Progression Note (Signed)
 Transition of Care [facility]) - Progression Note    Patient Details  Name: Amanda [name] MRN: [id number] Date of Birth: 03-Apr-1961  Transition [facility]) CM/SW Contact  Almarie CHRISTELLA Goodie, [place] Phone Number: 04/14/2024, 3:59 PM  Clinical Narrative:   Patient has been reporting that she has a friend that will come stay with her and provide care for her so that she can go home. CSW spoke with patient's daughter, [name], to discuss and verify. Per [name], the patient does have a friend who has been homeless but could stay with her, but [name] is not sure the level of support/care that the friend could provide. [name] does not have her contact information, but thinks the patient's boyfriend has it, will try to contact him to get it. [name] asked about what assistance the patient would require, and CSW discussed the patient's cognitive deficits and would need 24/7 assistance, someone to manage her bills, cooking, cleaning, and medicines. [name] reported that she is not able to do those things as she is in [facility], and unsure how much the patient's friend is able to help. [name] said she would call CSW back.  CSW updated by MD that RN had placed a name and phone number in a note. CSW attempted to reach [name] Bond at the number listed in RN note [phone]), and left a HIPAA compliant voicemail asking for a call back. Awaiting response. CSW to follow.    Expected Discharge Plan:  (TBD) Barriers to Discharge: English As A Second Language Teacher, Continued Medical Work up, Inadequate or no insurance, Facility will not accept until restraint criteria met, SNF Pending bed offer               Expected Discharge Plan and Services In-house Referral: Clinical Social Work   Post Acute Care Choice: Skilled Nursing Facility Living arrangements for the past 2 months: Single Family Home                                       Social Drivers of Health (SDOH) Interventions SDOH Screenings   Food  Insecurity: No Food Insecurity (07/16/2023)   Received from [facility]  Housing: Low Risk (07/16/2023)   Received from [facility]  Transportation Needs: No Transportation Needs (07/16/2023)   Received from [facility]  Utilities: Not At Risk (07/16/2023)   Received from [facility]  Alcohol Screen: Low Risk (05/14/2023)  Financial Resource Strain: Low Risk (07/16/2023)   Received from [facility]  Tobacco Use: High Risk (01/26/2024)    Readmission Risk Interventions    03/26/2022   10:50 AM  Readmission Risk Prevention Plan  Transportation Screening Complete  HRI or Home Care Consult Complete  Social Work Consult for Recovery Care Planning/Counseling Complete  Palliative Care Screening Not Applicable  Medication Review Oceanographer) Referral to Pharmacy

## 2024-04-14 NOTE — Progress Notes (Signed)
 Occupational Therapy Treatment Patient Details Name: Amanda [name] MRN: [id number] DOB: 1960-12-31 Today's Date: 04/14/2024   History of present illness 64 yo female presents to [facility] on 9/22 with lethargy and near-syncope s/p cocaine and opioid ingestion. 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 S/p R frontal ventriculostomy, burr holes 9/22. ETT 9/22-9/23. S/p EVD removal 10/3. PEG tube removed 12/2 following improved p.o. intake. PMHx of cocaine abuse, CHF, CKD, chronic cough, HTN and DM2.   OT comments  Pt seen for OT treatment this AM. Pt met 3/5 current OT goals and has demonstrated stagnant progress over the last several OT sessions with limited ability to learn/accept new information. She is functioning at a level of supervision for all mobility/ADLs with RW. Con't to recommend strict supervision/assist with all IADLs and transportation due to cognitive deficits. Given pt's decr progress and that she met 3/5 goals, acute OT services to sign-off.      If plan is discharge home, recommend the following:  Assistance with cooking/housework;Help with stairs or ramp for entrance;Direct supervision/assist for financial management;Direct supervision/assist for medications management;Assist for transportation;Supervision due to cognitive status;A little help with walking and/or transfers;A little help with bathing/dressing/bathroom   Equipment Recommendations   (RW)    Recommendations for Other Services      Precautions / Restrictions Precautions Precautions: Fall Recall of Precautions/Restrictions: Impaired Precaution/Restrictions Comments: SBP < 160, impulsive Restrictions Weight Bearing Restrictions Per Provider Order: No       Mobility Bed Mobility Overal bed mobility: Needs Assistance Bed Mobility: Supine to Sit, Sit to Supine     Supine to sit: Supervision Sit to supine:  Supervision   General bed mobility comments: used rails    Transfers Overall transfer level: Needs assistance Equipment used: Rolling walker (2 wheels) Transfers: Sit to/from Stand Sit to Stand: Supervision, Contact guard assist           General transfer comment: stood from bed, VC for safe hand placement     Balance Overall balance assessment: Needs assistance Sitting-balance support: No upper extremity supported, Feet supported Sitting balance-Leahy Scale: Good Sitting balance - Comments: seated EOB   Standing balance support: Bilateral upper extremity supported, During functional activity Standing balance-Leahy Scale: Fair Standing balance comment: reliant on RW                           ADL either performed or assessed with clinical judgement   ADL Overall ADL's : Needs assistance/impaired Eating/Feeding: Independent   Grooming: Supervision/safety;Standing   Upper Body Bathing: Supervision/ safety;Cueing for sequencing;Standing           Lower Body Dressing: Supervision/safety;Sitting/lateral leans               Functional mobility during ADLs: Supervision/safety;Rolling walker (2 wheels)      Extremity/Trunk Assessment              Vision       Perception     Praxis     Communication Communication Communication: Impaired   Cognition Arousal: Alert Behavior During Therapy: Impulsive (participatory) Cognition: Cognition impaired             OT - Cognition Comments: remains very distractable                 Following commands: Impaired Following commands impaired: Follows one step commands inconsistently      Cueing   Cueing Techniques: Verbal cues,  Gestural cues  Exercises      Shoulder Instructions       General Comments VSS    Pertinent Vitals/ Pain       Pain Assessment Pain Assessment: No/denies pain  Home Living                                          Prior  Functioning/Environment              Frequency           Progress Toward Goals  OT Goals(current goals can now be found in the care plan section)  Progress towards OT goals: Goals met/education completed, patient discharged from OT     Plan      Co-evaluation                 AM-PAC OT 6 Clicks Daily Activity     Outcome Measure   Help from another person eating meals?: None Help from another person taking care of personal grooming?: None Help from another person toileting, which includes using toliet, bedpan, or urinal?: A Little Help from another person bathing (including washing, rinsing, drying)?: A Little Help from another person to put on and taking off regular upper body clothing?: A Little Help from another person to put on and taking off regular lower body clothing?: A Little 6 Click Score: 20    End of Session Equipment Utilized During Treatment: Gait belt;Rolling walker (2 wheels)  OT Visit Diagnosis: Unsteadiness on feet (R26.81);Other abnormalities of gait and mobility (R26.89);Muscle weakness (generalized) (M62.81)   Activity Tolerance Patient tolerated treatment well   Patient Left in bed;with call bell/phone within reach;with bed alarm set   Nurse Communication Mobility status        Time: 9074-9062 OT Time Calculation (min): 12 min  Charges: OT General Charges $OT Visit: 1 Visit OT Treatments $Self Care/Home Management : 8-22 mins  Alisia Vanengen M. [place], OTR/L [facility] Acute Rehabilitation Services [phone] Secure Chat Preferred  Lot [place] 04/14/2024, 12:59 PM

## 2024-04-15 NOTE — Progress Notes (Signed)
 " PROGRESS NOTE    Amanda [name]  FMW:[id number] DOB: 04-30-1960 DOA: 01/03/2024 PCP: Patient, No Pcp Per  Subjective: No acute overnight events. Patient asking again if she can go home with her friend.  Denies pain.  She has been eating and drinking okay.  Patient was on speaker phone with her husband.  He said he was not in good shape himself  and trying to manage 24/7 care for her  Hospital Course: Amanda [name] is a 64 y.o. female with a history of hypertension, heart failure, cocaine abuse,d diabetes mellitus type 2. Patient presented secondary to lethargy and near syncope, found to have significantly elevated blood pressure and CT evidence of acute intraparenchymal hemorrhage with intraventricular hemorrhage and hydrocephalus. Patient admitted to ICU for close blood pressure management and Neurosurgery consulted, performing a right frontal ventriculostomy. EVD eventually removed on 10/3.  PEG tube required on 10/16 and removed on 12/2.   Assessment and Plan:  Acute right thalamic intraparenchymal hemorrhage Intraventricular hemorrhage Obstructive hydrocephalus In setting of cocaine use. 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 Neurosurgery consulted no emergent placement of a ventricular drain was recommended. Patient intubated and transferred to the ICU on admission. Blood pressure management with Cleviprex . Repeat CT head significant for increase in size of medial right thalamic hematoma. Patient underwent right frontal ventriculostomy on 9/22 which was eventual clamped on 10/1 and removed on 10/3.  Repeat CT head without contrast done showed subtle sequela of prior right thalamic and left basal ganglia hemorrhage. Remote left caudate lacunar infarct. She is refusing scd's, will consider lovenox  injections now that her repeat CT head (12/28) is negative for hemorrhage    Acute metabolic encephalopathy Present on presentation and  related to hydrocephalus and IVH.  She is alert and oriented to person and place.  She is impulsive at times but has mostly been calm and cooperateive    Chronic combined systolic and diastolic heart failure LVEF of 40% with associated grade I diastolic dysfunction.  - Continue Coreg , spironolactone  and Entresto  - she appears euvolemic.    Acute respiratory failure with hypoxia Patient with worsening mental status and concern for inability to protect her airway on admission. Patient was intubated on 9/22 and managed via mechanical ventilation. Patient extubated successfully on 9/23.  She is currently on RA, but has some cough.  CT chest repeated on 12/28 showed Asymmetric increased ground-glass attenuation in the posterior right upper lobe. Favor nonspecific pneumonitis. - completed 5 days of azithromycin      Tachypnea Unclear etiology. Per patient, no dyspnea. Possibly related to aspiration in setting of dysphagia. Chest x-ray unremarkable. Patient remains with SpO2 of 100% on room air. Possibly anxiety related. VBG consistent with respiratory alkalosis. D-dimer negative. Symptoms resolved.   Primary hypertension Hypertensive emergency Hypertensive emergency in setting of stroke and severely elevated blood pressure. Patient managed on Cleviprex  drip while in ICU. Cleviprex  weaned off. -Continue Coreg , spironolactone  and Entresto . -- BP parameters are well controlled.    Diabetes mellitus type 2 Well controlled based on hemoglobin A1C of 6.6%.   Cocaine abuse Patient with a history of cocaine use, most recently 3 hours prior to presentation. Cocaine positive on UDS.   Abnormal CT chest Imaging significant for right apical pleural scarring versus density. Recommendation for repeat CT scan in 3 months from 01/03/2024. Repeat CT chest showed  Interval resolution of the prior asymmetric density in the apical segment of the right upper lobe, compatible with an inflammatory or infectious  process. Asymmetric increased ground-glass attenuation in the posterior right upper lobe. Favor nonspecific pneumonitis. 4 mm left upper lobe lung nodule is unchanged. In a patient who was at low risk, no further follow-up is indicated. If the patient is at increased risk a follow-up CT of the chest without contrast material in 12 months may be considered. - completed azithromycin  as above    Dysphagia Moderate malnutrition Patient required PEG tube placement on 10/16 with initiation of tube feeds. Patient with improved oral intake and PEG tube removed on 12/2. -Dietitian recommendations (12/15): Continue current diet as ordered per SLP Encourage PO intake  Changed to ordering assistance Ensure Plus High Protein po TID, each supplement provides 350 kcal and 20 grams of protein MVI with minerals daily      DVT prophylaxis: Place and maintain sequential compression device Start: 04/13/24 2120 Place TED hose Start: 04/13/24 2120 enoxaparin  (LOVENOX ) injection 40 mg Start: 04/13/24 1700 Place and maintain sequential compression device Start: 01/03/24 0611 SCDs Start: 01/03/24 0429    Code Status: Full Code Family Communication: Spoke with husband over speaker phone  Disposition Plan: TBD Reason for continuing need for hospitalization: no bed offer   Objective: Vitals:   04/14/24 0500 04/14/24 0737 04/14/24 2200 04/15/24 0500  BP:  110/82 110/78   Pulse:  70 72   Resp:  17 18   Temp:  98.5 F (36.9 C) 97.7 F (36.5 C)   TempSrc:  Oral Oral   SpO2:  95% 94%   Weight: 53.1 kg   53.5 kg  Height:        Intake/Output Summary (Last 24 hours) at 04/15/2024 1052 Last data filed at 04/15/2024 1000 Gross per 24 hour  Intake 480 ml  Output --  Net 480 ml   Filed Weights   04/13/24 0500 04/14/24 0500 04/15/24 0500  Weight: 53.8 kg 53.1 kg 53.5 kg    Examination:  Physical Exam Vitals and nursing note reviewed.  Constitutional:      General: She is not in acute  distress. Cardiovascular:     Rate and Rhythm: Normal rate.  Pulmonary:     Effort: No respiratory distress.  Abdominal:     General: There is no distension.     Tenderness: There is no abdominal tenderness.  Musculoskeletal:     Right lower leg: No edema.     Left lower leg: No edema.     Data Reviewed: I have personally reviewed following labs and imaging studies  CBC: No results for input(s): WBC, NEUTROABS, HGB, HCT, MCV, PLT in the last 168 hours. Basic Metabolic Panel: No results for input(s): NA, K, CL, CO2, GLUCOSE, BUN, CREATININE, CALCIUM, MG, PHOS in the last 168 hours. GFR: Estimated Creatinine Clearance: 60.1 mL/min (by C-G formula based on SCr of 0.82 mg/dL). Liver Function Tests: No results for input(s): AST, ALT, ALKPHOS, BILITOT, PROT, ALBUMIN in the last 168 hours. No results for input(s): LIPASE, AMYLASE in the last 168 hours. No results for input(s): AMMONIA in the last 168 hours. Coagulation Profile: No results for input(s): INR, PROTIME in the last 168 hours. Cardiac Enzymes: No results for input(s): CKTOTAL, CKMB, CKMBINDEX, TROPONINI in the last 168 hours. ProBNP, BNP (last 5 results) Recent Labs    05/12/23 0553 01/03/24 0145  BNP 1,407.8* 370.8*   HbA1C: No results for input(s): HGBA1C in the last 72 hours. CBG: No results for input(s): GLUCAP in the last 168 hours. Lipid Profile: No results for input(s): CHOL, HDL, LDLCALC, TRIG, CHOLHDL,  LDLDIRECT in the last 72 hours. Thyroid Function Tests: No results for input(s): TSH, T4TOTAL, FREET4, T3FREE, THYROIDAB in the last 72 hours. Anemia Panel: No results for input(s): VITAMINB12, FOLATE, FERRITIN, TIBC, IRON, RETICCTPCT in the last 72 hours. Sepsis Labs: No results for input(s): PROCALCITON, LATICACIDVEN in the last 168 hours.  No results found for this or any previous visit (from the past  240 hours).   Radiology Studies: No results found.  Scheduled Meds:  carvedilol   25 mg Oral BID WC   enoxaparin  (LOVENOX ) injection  40 mg Subcutaneous Q24H   feeding supplement  237 mL Oral TID BM   mineral oil-hydrophilic petrolatum    Topical BID   QUEtiapine   25 mg Oral QAC supper   sacubitril -valsartan   1 tablet Oral BID   senna-docusate  1 tablet Oral QHS   spironolactone   25 mg Oral Daily   Continuous Infusions:   LOS: 103 days   Derryl Duval, MD  [facility]  04/15/2024, 10:52 AM   "

## 2024-04-15 NOTE — Plan of Care (Signed)
   Problem: Education: Goal: Knowledge of disease or condition will improve Outcome: Progressing Goal: Knowledge of secondary prevention will improve (MUST DOCUMENT ALL) Outcome: Progressing Goal: Knowledge of patient specific risk factors will improve (DELETE if not current risk factor) Outcome: Progressing

## 2024-04-16 NOTE — Plan of Care (Signed)
   Problem: Education: Goal: Knowledge of disease or condition will improve Outcome: Progressing Goal: Knowledge of secondary prevention will improve (MUST DOCUMENT ALL) Outcome: Progressing Goal: Knowledge of patient specific risk factors will improve (DELETE if not current risk factor) Outcome: Progressing   Problem: Intracerebral Hemorrhage Tissue Perfusion: Goal: Complications of Intracerebral Hemorrhage will be minimized Outcome: Progressing

## 2024-04-16 NOTE — Progress Notes (Signed)
 " PROGRESS NOTE    Amanda [name]  FMW:[id number] DOB: 09/12/1960 DOA: 01/03/2024 PCP: Patient, No Pcp Per  Subjective: No acute overnight events.  Awaiting safe discharge plan  Hospital Course: Amanda BRIGHTBILL is a 64 y.o. female with a history of hypertension, heart failure, cocaine abuse,d diabetes mellitus type 2. Patient presented secondary to lethargy and near syncope, found to have significantly elevated blood pressure and CT evidence of acute intraparenchymal hemorrhage with intraventricular hemorrhage and hydrocephalus. Patient admitted to ICU for close blood pressure management and Neurosurgery consulted, performing a right frontal ventriculostomy. EVD eventually removed on 10/3.  PEG tube required on 10/16 and removed on 12/2.   Assessment and Plan:  Acute right thalamic intraparenchymal hemorrhage Intraventricular hemorrhage Obstructive hydrocephalus In setting of cocaine use. 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 Neurosurgery consulted no emergent placement of a ventricular drain was recommended. Patient intubated and transferred to the ICU on admission. Blood pressure management with Cleviprex . Repeat CT head significant for increase in size of medial right thalamic hematoma. Patient underwent right frontal ventriculostomy on 9/22 which was eventual clamped on 10/1 and removed on 10/3.  Repeat CT head without contrast done showed subtle sequela of prior right thalamic and left basal ganglia hemorrhage. Remote left caudate lacunar infarct. She is refusing scd's, will consider lovenox  injections now that her repeat CT head (12/28) is negative for hemorrhage    Acute metabolic encephalopathy Present on presentation and related to hydrocephalus and IVH.  She is alert and oriented to person and place.  She is impulsive at times but has mostly been calm and cooperateive    Chronic combined systolic and diastolic heart  failure LVEF of 40% with associated grade I diastolic dysfunction.  - Continue Coreg , spironolactone  and Entresto  - she appears euvolemic.    Acute respiratory failure with hypoxia Patient with worsening mental status and concern for inability to protect her airway on admission. Patient was intubated on 9/22 and managed via mechanical ventilation. Patient extubated successfully on 9/23.  She is currently on RA, but has some cough.  CT chest repeated on 12/28 showed Asymmetric increased ground-glass attenuation in the posterior right upper lobe. Favor nonspecific pneumonitis. - completed 5 days of azithromycin      Tachypnea Unclear etiology. Per patient, no dyspnea. Possibly related to aspiration in setting of dysphagia. Chest x-ray unremarkable. Patient remains with SpO2 of 100% on room air. Possibly anxiety related. VBG consistent with respiratory alkalosis. D-dimer negative. Symptoms resolved.   Primary hypertension Hypertensive emergency Hypertensive emergency in setting of stroke and severely elevated blood pressure. Patient managed on Cleviprex  drip while in ICU. Cleviprex  weaned off. -Continue Coreg , spironolactone  and Entresto . -- BP parameters are well controlled.    Diabetes mellitus type 2 Well controlled based on hemoglobin A1C of 6.6%.   Cocaine abuse Patient with a history of cocaine use, most recently 3 hours prior to presentation. Cocaine positive on UDS.   Abnormal CT chest Imaging significant for right apical pleural scarring versus density. Recommendation for repeat CT scan in 3 months from 01/03/2024. Repeat CT chest showed  Interval resolution of the prior asymmetric density in the apical segment of the right upper lobe, compatible with an inflammatory or infectious process. Asymmetric increased ground-glass attenuation in the posterior right upper lobe. Favor nonspecific pneumonitis. 4 mm left upper lobe lung nodule is unchanged. In a patient who was at low risk, no  further follow-up is indicated. If the patient is at increased risk a follow-up  CT of the chest without contrast material in 12 months may be considered. - completed azithromycin  as above    Dysphagia Moderate malnutrition Patient required PEG tube placement on 10/16 with initiation of tube feeds. Patient with improved oral intake and PEG tube removed on 12/2. -Dietitian recommendations (12/15): Continue current diet as ordered per SLP Encourage PO intake  Changed to ordering assistance Ensure Plus High Protein po TID, each supplement provides 350 kcal and 20 grams of protein MVI with minerals daily      DVT prophylaxis: Place and maintain sequential compression device Start: 04/13/24 2120 Place TED hose Start: 04/13/24 2120 enoxaparin  (LOVENOX ) injection 40 mg Start: 04/13/24 1700 Place and maintain sequential compression device Start: 01/03/24 0611 SCDs Start: 01/03/24 0429    Code Status: Full Code Family Communication: Spoke with husband over speaker phone 1/3  Disposition Plan: TBD Reason for continuing need for hospitalization: no bed offer   Objective: Vitals:   04/16/24 0117 04/16/24 0405 04/16/24 0758 04/16/24 1142  BP: 117/76 (!) 149/84 (!) 148/93 93/70  Pulse:  62 72 74  Resp: 17 19 18 18   Temp: 98 F (36.7 C) 97.9 F (36.6 C) 97.7 F (36.5 C) 98.9 F (37.2 C)  TempSrc: Oral Oral Oral Oral  SpO2: 100% 97% 100% 97%  Weight:      Height:        Intake/Output Summary (Last 24 hours) at 04/16/2024 1452 Last data filed at 04/16/2024 1000 Gross per 24 hour  Intake 360 ml  Output --  Net 360 ml   Filed Weights   04/13/24 0500 04/14/24 0500 04/15/24 0500  Weight: 53.8 kg 53.1 kg 53.5 kg    Examination:  Physical Exam Vitals and nursing note reviewed.  Constitutional:      General: She is not in acute distress. Cardiovascular:     Rate and Rhythm: Normal rate.  Pulmonary:     Effort: No respiratory distress.  Abdominal:     General: There is no  distension.     Tenderness: There is no abdominal tenderness.  Musculoskeletal:     Right lower leg: No edema.     Left lower leg: No edema.     Data Reviewed: I have personally reviewed following labs and imaging studies  CBC: No results for input(s): WBC, NEUTROABS, HGB, HCT, MCV, PLT in the last 168 hours. Basic Metabolic Panel: No results for input(s): NA, K, CL, CO2, GLUCOSE, BUN, CREATININE, CALCIUM, MG, PHOS in the last 168 hours. GFR: Estimated Creatinine Clearance: 60.1 mL/min (by C-G formula based on SCr of 0.82 mg/dL). Liver Function Tests: No results for input(s): AST, ALT, ALKPHOS, BILITOT, PROT, ALBUMIN in the last 168 hours. No results for input(s): LIPASE, AMYLASE in the last 168 hours. No results for input(s): AMMONIA in the last 168 hours. Coagulation Profile: No results for input(s): INR, PROTIME in the last 168 hours. Cardiac Enzymes: No results for input(s): CKTOTAL, CKMB, CKMBINDEX, TROPONINI in the last 168 hours. ProBNP, BNP (last 5 results) Recent Labs    05/12/23 0553 01/03/24 0145  BNP 1,407.8* 370.8*   HbA1C: No results for input(s): HGBA1C in the last 72 hours. CBG: No results for input(s): GLUCAP in the last 168 hours. Lipid Profile: No results for input(s): CHOL, HDL, LDLCALC, TRIG, CHOLHDL, LDLDIRECT in the last 72 hours. Thyroid Function Tests: No results for input(s): TSH, T4TOTAL, FREET4, T3FREE, THYROIDAB in the last 72 hours. Anemia Panel: No results for input(s): VITAMINB12, FOLATE, FERRITIN, TIBC, IRON, RETICCTPCT in the last  72 hours. Sepsis Labs: No results for input(s): PROCALCITON, LATICACIDVEN in the last 168 hours.  No results found for this or any previous visit (from the past 240 hours).   Radiology Studies: No results found.  Scheduled Meds:  carvedilol   25 mg Oral BID WC   enoxaparin  (LOVENOX ) injection  40 mg  Subcutaneous Q24H   feeding supplement  237 mL Oral TID BM   mineral oil-hydrophilic petrolatum    Topical BID   QUEtiapine   25 mg Oral QAC supper   sacubitril -valsartan   1 tablet Oral BID   senna-docusate  1 tablet Oral QHS   spironolactone   25 mg Oral Daily   Continuous Infusions:   LOS: 104 days   Derryl Duval, MD  [facility]  04/16/2024, 2:52 PM   "

## 2024-04-16 NOTE — Plan of Care (Signed)
" °  Problem: Education: Goal: Knowledge of disease or condition will improve 04/16/2024 1048 by Hayly Litsey, Charleen PARAS, RN Outcome: Progressing 04/16/2024 1047 by Linton Charleen PARAS, RN Outcome: Progressing Goal: Knowledge of secondary prevention will improve (MUST DOCUMENT ALL) 04/16/2024 1048 by Linton Charleen PARAS, RN Outcome: Progressing 04/16/2024 1047 by Linton Charleen PARAS, RN Outcome: Progressing Goal: Knowledge of patient specific risk factors will improve (DELETE if not current risk factor) 04/16/2024 1048 by Kortlyn Koltz, Charleen PARAS, RN Outcome: Progressing 04/16/2024 1047 by Linton Charleen PARAS, RN Outcome: Progressing   Problem: Intracerebral Hemorrhage Tissue Perfusion: Goal: Complications of Intracerebral Hemorrhage will be minimized Outcome: Progressing   "

## 2024-04-17 NOTE — Progress Notes (Signed)
 " PROGRESS NOTE    Amanda [name]  FMW:[id number] DOB: 1960-04-30 DOA: 01/03/2024 PCP: Patient, No Pcp Per  Subjective: No acute overnight events.  Awaiting safe discharge plan  Hospital Course: Amanda [name] is a 64 y.o. female with a history of hypertension, heart failure, cocaine abuse,d diabetes mellitus type 2. Patient presented secondary to lethargy and near syncope, found to have significantly elevated blood pressure and CT evidence of acute intraparenchymal hemorrhage with intraventricular hemorrhage and hydrocephalus. Patient admitted to ICU for close blood pressure management and Neurosurgery consulted, performing a right frontal ventriculostomy. EVD eventually removed on 10/3.  PEG tube required on 10/16 and removed on 12/2.   Assessment and Plan:  Acute right thalamic intraparenchymal hemorrhage Intraventricular hemorrhage Obstructive hydrocephalus In setting of cocaine use. 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 Neurosurgery consulted no emergent placement of a ventricular drain was recommended. Patient intubated and transferred to the ICU on admission. Blood pressure management with Cleviprex . Repeat CT head significant for increase in size of medial right thalamic hematoma. Patient underwent right frontal ventriculostomy on 9/22 which was eventual clamped on 10/1 and removed on 10/3.  Repeat CT head without contrast done showed subtle sequela of prior right thalamic and left basal ganglia hemorrhage. Remote left caudate lacunar infarct. She is refusing scd's, will consider lovenox  injections now that her repeat CT head (12/28) is negative for hemorrhage    Acute metabolic encephalopathy Present on presentation and related to hydrocephalus and IVH.  She is alert and oriented to person and place.  She is impulsive at times but has mostly been calm and cooperateive    Chronic combined systolic and diastolic heart  failure LVEF of 40% with associated grade I diastolic dysfunction.  - Continue Coreg , spironolactone  and Entresto  - she appears euvolemic.    Acute respiratory failure with hypoxia Patient with worsening mental status and concern for inability to protect her airway on admission. Patient was intubated on 9/22 and managed via mechanical ventilation. Patient extubated successfully on 9/23.  She is currently on RA, but has some cough.  CT chest repeated on 12/28 showed Asymmetric increased ground-glass attenuation in the posterior right upper lobe. Favor nonspecific pneumonitis. - completed 5 days of azithromycin      Tachypnea Unclear etiology. Per patient, no dyspnea. Possibly related to aspiration in setting of dysphagia. Chest x-ray unremarkable. Patient remains with SpO2 of 100% on room air. Possibly anxiety related. VBG consistent with respiratory alkalosis. D-dimer negative. Symptoms resolved.   Primary hypertension Hypertensive emergency Hypertensive emergency in setting of stroke and severely elevated blood pressure. Patient managed on Cleviprex  drip while in ICU. Cleviprex  weaned off. -Continue Coreg , spironolactone  and Entresto . -- BP parameters are well controlled.    Diabetes mellitus type 2 Well controlled based on hemoglobin A1C of 6.6%.   Cocaine abuse Patient with a history of cocaine use, most recently 3 hours prior to presentation. Cocaine positive on UDS.   Abnormal CT chest Imaging significant for right apical pleural scarring versus density. Recommendation for repeat CT scan in 3 months from 01/03/2024. Repeat CT chest showed  Interval resolution of the prior asymmetric density in the apical segment of the right upper lobe, compatible with an inflammatory or infectious process. Asymmetric increased ground-glass attenuation in the posterior right upper lobe. Favor nonspecific pneumonitis. 4 mm left upper lobe lung nodule is unchanged. In a patient who was at low risk, no  further follow-up is indicated. If the patient is at increased risk a follow-up  CT of the chest without contrast material in 12 months may be considered. - completed azithromycin  as above    Dysphagia Moderate malnutrition Patient required PEG tube placement on 10/16 with initiation of tube feeds. Patient with improved oral intake and PEG tube removed on 12/2. -Dietitian recommendations (12/15): Continue current diet as ordered per SLP Encourage PO intake  Changed to ordering assistance Ensure Plus High Protein po TID, each supplement provides 350 kcal and 20 grams of protein MVI with minerals daily      DVT prophylaxis: Place and maintain sequential compression device Start: 04/13/24 2120 Place TED hose Start: 04/13/24 2120 enoxaparin  (LOVENOX ) injection 40 mg Start: 04/13/24 1700 Place and maintain sequential compression device Start: 01/03/24 0611 SCDs Start: 01/03/24 0429    Code Status: Full Code Family Communication: Spoke with husband over speaker phone 1/3  Disposition Plan: TBD Reason for continuing need for hospitalization: no bed offer   Objective: Vitals:   04/16/24 2000 04/16/24 2351 04/17/24 0420 04/17/24 1153  BP: 104/69 113/78 130/82 122/78  Pulse: 80 80 64 64  Resp: 20 16 20 16   Temp: 97.7 F (36.5 C) 98.4 F (36.9 C) 97.6 F (36.4 C) (!) 97.4 F (36.3 C)  TempSrc: Oral Oral Oral Oral  SpO2: 98% 99% 98% 96%  Weight:   53.6 kg   Height:        Intake/Output Summary (Last 24 hours) at 04/17/2024 1333 Last data filed at 04/17/2024 1000 Gross per 24 hour  Intake 240 ml  Output --  Net 240 ml   Filed Weights   04/14/24 0500 04/15/24 0500 04/17/24 0420  Weight: 53.1 kg 53.5 kg 53.6 kg    Examination:  Physical Exam Vitals and nursing note reviewed.  Constitutional:      General: She is not in acute distress. Cardiovascular:     Rate and Rhythm: Normal rate.  Pulmonary:     Effort: No respiratory distress.  Abdominal:     General: There is no  distension.     Tenderness: There is no abdominal tenderness.  Musculoskeletal:     Right lower leg: No edema.     Left lower leg: No edema.     Data Reviewed: I have personally reviewed following labs and imaging studies  CBC: No results for input(s): WBC, NEUTROABS, HGB, HCT, MCV, PLT in the last 168 hours. Basic Metabolic Panel: No results for input(s): NA, K, CL, CO2, GLUCOSE, BUN, CREATININE, CALCIUM, MG, PHOS in the last 168 hours. GFR: Estimated Creatinine Clearance: 60.2 mL/min (by C-G formula based on SCr of 0.82 mg/dL). Liver Function Tests: No results for input(s): AST, ALT, ALKPHOS, BILITOT, PROT, ALBUMIN in the last 168 hours. No results for input(s): LIPASE, AMYLASE in the last 168 hours. No results for input(s): AMMONIA in the last 168 hours. Coagulation Profile: No results for input(s): INR, PROTIME in the last 168 hours. Cardiac Enzymes: No results for input(s): CKTOTAL, CKMB, CKMBINDEX, TROPONINI in the last 168 hours. ProBNP, BNP (last 5 results) Recent Labs    05/12/23 0553 01/03/24 0145  BNP 1,407.8* 370.8*   HbA1C: No results for input(s): HGBA1C in the last 72 hours. CBG: No results for input(s): GLUCAP in the last 168 hours. Lipid Profile: No results for input(s): CHOL, HDL, LDLCALC, TRIG, CHOLHDL, LDLDIRECT in the last 72 hours. Thyroid Function Tests: No results for input(s): TSH, T4TOTAL, FREET4, T3FREE, THYROIDAB in the last 72 hours. Anemia Panel: No results for input(s): VITAMINB12, FOLATE, FERRITIN, TIBC, IRON, RETICCTPCT in the last  72 hours. Sepsis Labs: No results for input(s): PROCALCITON, LATICACIDVEN in the last 168 hours.  No results found for this or any previous visit (from the past 240 hours).   Radiology Studies: No results found.  Scheduled Meds:  carvedilol   25 mg Oral BID WC   enoxaparin  (LOVENOX ) injection  40 mg  Subcutaneous Q24H   feeding supplement  237 mL Oral TID BM   mineral oil-hydrophilic petrolatum    Topical BID   QUEtiapine   25 mg Oral QAC supper   sacubitril -valsartan   1 tablet Oral BID   senna-docusate  1 tablet Oral QHS   spironolactone   25 mg Oral Daily   Continuous Infusions:   LOS: 105 days   Derryl Duval, MD  [facility]  04/17/2024, 1:33 PM   "

## 2024-04-17 NOTE — TOC Progression Note (Signed)
 Transition of Care [facility]) - Progression Note    Patient Details  Name: Amanda [name] MRN: [id number] Date of Birth: 02-12-1961  Transition of Care [facility]) CM/SW Contact  Amanda [name], Amanda [name] Phone Number: 04/17/2024, 2:43 PM  Clinical Narrative:   CSW spoke with patient's friend, Amanda [name], over the phone to discuss patient's care needs and Amanda [name] providing caregiving for the patient at home. Amanda [name] indicated understanding of care needs and willingness to help the patient at home. CSW obtained Amanda [name]'s number [phone]). Amanda [name] said she had not spoken with patient's daughter yet, needed to do that first before arranging time for patient to discharge home.  CSW called patient's daughter, Amanda [name], to discuss. Amanda [name] has not been able to obtain Amanda [name]'s number yet, so CSW shared Amanda [name]'s number with Amanda [name]. Amanda [name] in agreement with speaking with Amanda [name] to ensure she is comfortable with this plan, and will update CSW after discussion. CSW to follow.    Expected Discharge Plan:  (TBD) Barriers to Discharge: English As A Second Language Teacher, Continued Medical Work up, Inadequate or no insurance, Facility will not accept until restraint criteria met, SNF Pending bed offer               Expected Discharge Plan and Services In-house Referral: Clinical Social Work   Post Acute Care Choice: Skilled Nursing Facility Living arrangements for the past 2 months: Single Family Home                                       Social Drivers of Health (SDOH) Interventions SDOH Screenings   Food Insecurity: No Food Insecurity (07/16/2023)   Received from [facility]  Housing: Low Risk (07/16/2023)   Received from [facility]  Transportation Needs: No Transportation Needs (07/16/2023)   Received from [facility]  Utilities: Not At Risk (07/16/2023)   Received from [facility]  Alcohol Screen: Low Risk (05/14/2023)  Financial Resource Strain: Low Risk (07/16/2023)   Received from [facility]   Tobacco Use: High Risk (01/26/2024)    Readmission Risk Interventions    03/26/2022   10:50 AM  Readmission Risk Prevention Plan  Transportation Screening Complete  HRI or Home Care Consult Complete  Social Work Consult for Recovery Care Planning/Counseling Complete  Palliative Care Screening Not Applicable  Medication Review Oceanographer) Referral to Pharmacy

## 2024-04-17 NOTE — Plan of Care (Signed)
 Problem: Education: Goal: Ability to describe self-care measures that may prevent or decrease complications (Diabetes Survival Skills Education) will improve Outcome: Progressing   Problem: Coping: Goal: Ability to adjust to condition or change in health will improve Outcome: Progressing   Problem: Fluid Volume: Goal: Ability to maintain a balanced intake and output will improve Outcome: Progressing   Problem: Health Behavior/Discharge Planning: Goal: Ability to identify and utilize available resources and services will improve Outcome: Progressing Goal: Ability to manage health-related needs will improve Outcome: Progressing   Problem: Metabolic: Goal: Ability to maintain appropriate glucose levels will improve Outcome: Progressing   Problem: Nutritional: Goal: Maintenance of adequate nutrition will improve Outcome: Progressing Goal: Progress toward achieving an optimal weight will improve Outcome: Progressing   Problem: Skin Integrity: Goal: Risk for impaired skin integrity will decrease Outcome: Progressing   Problem: Tissue Perfusion: Goal: Adequacy of tissue perfusion will improve Outcome: Progressing   Problem: Education: Goal: Knowledge of General Education information will improve Description: Including pain rating scale, medication(s)/side effects and non-pharmacologic comfort measures Outcome: Progressing   Problem: Health Behavior/Discharge Planning: Goal: Ability to manage health-related needs will improve Outcome: Progressing   Problem: Clinical Measurements: Goal: Ability to maintain clinical measurements within normal limits will improve Outcome: Progressing Goal: Will remain free from infection Outcome: Progressing Goal: Diagnostic test results will improve Outcome: Progressing Goal: Respiratory complications will improve Outcome: Progressing Goal: Cardiovascular complication will be avoided Outcome: Progressing   Problem: Activity: Goal:  Risk for activity intolerance will decrease Outcome: Progressing   Problem: Nutrition: Goal: Adequate nutrition will be maintained Outcome: Progressing   Problem: Coping: Goal: Level of anxiety will decrease Outcome: Progressing   Problem: Elimination: Goal: Will not experience complications related to bowel motility Outcome: Progressing Goal: Will not experience complications related to urinary retention Outcome: Progressing   Problem: Pain Managment: Goal: General experience of comfort will improve and/or be controlled Outcome: Progressing   Problem: Safety: Goal: Ability to remain free from injury will improve Outcome: Progressing   Problem: Skin Integrity: Goal: Risk for impaired skin integrity will decrease Outcome: Progressing   Problem: Education: Goal: Knowledge of disease or condition will improve Outcome: Progressing Goal: Knowledge of secondary prevention will improve (MUST DOCUMENT ALL) Outcome: Progressing Goal: Knowledge of patient specific risk factors will improve (DELETE if not current risk factor) Outcome: Progressing   Problem: Intracerebral Hemorrhage Tissue Perfusion: Goal: Complications of Intracerebral Hemorrhage will be minimized Outcome: Progressing   Problem: Coping: Goal: Will verbalize positive feelings about self Outcome: Progressing Goal: Will identify appropriate support needs Outcome: Progressing   Problem: Health Behavior/Discharge Planning: Goal: Ability to manage health-related needs will improve Outcome: Progressing Goal: Goals will be collaboratively established with patient/family Outcome: Progressing   Problem: Self-Care: Goal: Ability to participate in self-care as condition permits will improve Outcome: Progressing Goal: Verbalization of feelings and concerns over difficulty with self-care will improve Outcome: Progressing Goal: Ability to communicate needs accurately will improve Outcome: Progressing   Problem:  Nutrition: Goal: Risk of aspiration will decrease Outcome: Progressing Goal: Dietary intake will improve Outcome: Progressing   Problem: Education: Goal: Knowledge of disease or condition will improve Outcome: Progressing Goal: Knowledge of secondary prevention will improve (MUST DOCUMENT ALL) Outcome: Progressing Goal: Knowledge of patient specific risk factors will improve (DELETE if not current risk factor) Outcome: Progressing   Problem: Intracerebral Hemorrhage Tissue Perfusion: Goal: Complications of Intracerebral Hemorrhage will be minimized Outcome: Progressing   Problem: Coping: Goal: Will verbalize positive feelings about self Outcome: Progressing Goal: Will  identify appropriate support needs Outcome: Progressing   Problem: Health Behavior/Discharge Planning: Goal: Ability to manage health-related needs will improve Outcome: Progressing Goal: Goals will be collaboratively established with patient/family Outcome: Progressing   Problem: Self-Care: Goal: Ability to participate in self-care as condition permits will improve Outcome: Progressing Goal: Verbalization of feelings and concerns over difficulty with self-care will improve Outcome: Progressing Goal: Ability to communicate needs accurately will improve Outcome: Progressing

## 2024-04-18 NOTE — Progress Notes (Signed)
 " PROGRESS NOTE    Amanda [name]  FMW:[id number] DOB: [birth year] DOA: 01/03/2024 PCP: Patient, No Pcp Per   Subjective: Patient without complaints.   Hospital Course: ELIZABTH PALKA is a 64 y.o. female with a history of hypertension, heart failure, cocaine abuse,d diabetes mellitus type 2. Patient presented secondary to lethargy and near syncope, found to have significantly elevated blood pressure and CT evidence of acute intraparenchymal hemorrhage with intraventricular hemorrhage and hydrocephalus. Patient admitted to ICU for close blood pressure management and Neurosurgery consulted, performing a right frontal ventriculostomy. EVD eventually removed on 10/3.  PEG tube required on 10/16 and removed on 12/2.   Assessment and Plan:   Acute right thalamic intraparenchymal hemorrhage Intraventricular hemorrhage Obstructive hydrocephalus In setting of cocaine use. 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 Neurosurgery consulted no emergent placement of a ventricular drain was recommended. Patient intubated and transferred to the ICU on admission. Blood pressure management with Cleviprex . Repeat CT head significant for increase in size of medial right thalamic hematoma. Patient underwent right frontal ventriculostomy on 9/22 which was eventual clamped on 10/1 and removed on 10/3.  Repeat CT head without contrast done showed subtle sequela of prior right thalamic and left basal ganglia hemorrhage. Remote left caudate lacunar infarct. She is refusing scd's, will consider lovenox  injections now that her repeat CT head (12/28) is negative for hemorrhage    Acute metabolic encephalopathy Present on presentation and related to hydrocephalus and IVH.  She is alert and oriented to person and place.  She is impulsive at times but has mostly been calm and cooperateive    Chronic combined systolic and diastolic heart failure LVEF of 40% with  associated grade I diastolic dysfunction.  - Continue Coreg , spironolactone  and Entresto  - she appears euvolemic.    Acute respiratory failure with hypoxia Patient with worsening mental status and concern for inability to protect her airway on admission. Patient was intubated on 9/22 and managed via mechanical ventilation. Patient extubated successfully on 9/23.  She is currently on RA, but has some cough.  CT chest repeated on 12/28 showed Asymmetric increased ground-glass attenuation in the posterior right upper lobe. Favor nonspecific pneumonitis. - completed 5 days of azithromycin      Tachypnea Unclear etiology. Per patient, no dyspnea. Possibly related to aspiration in setting of dysphagia. Chest x-ray unremarkable. Patient remains with SpO2 of 100% on room air. Possibly anxiety related. VBG consistent with respiratory alkalosis. D-dimer negative. Symptoms resolved.   Primary hypertension Hypertensive emergency Hypertensive emergency in setting of stroke and severely elevated blood pressure. Patient managed on Cleviprex  drip while in ICU. Cleviprex  weaned off. -Continue Coreg , spironolactone  and Entresto . -- BP parameters are well controlled.    Diabetes mellitus type 2 Well controlled based on hemoglobin A1C of 6.6%.   Cocaine abuse Patient with a history of cocaine use, most recently 3 hours prior to presentation. Cocaine positive on UDS.   Abnormal CT chest Imaging significant for right apical pleural scarring versus density. Recommendation for repeat CT scan in 3 months from 01/03/2024. Repeat CT chest showed  Interval resolution of the prior asymmetric density in the apical segment of the right upper lobe, compatible with an inflammatory or infectious process. Asymmetric increased ground-glass attenuation in the posterior right upper lobe. Favor nonspecific pneumonitis. 4 mm left upper lobe lung nodule is unchanged. In a patient who was at low risk, no further follow-up is  indicated. If the patient is at increased risk a follow-up CT of the  chest without contrast material in 12 months may be considered. - completed azithromycin  as above    Dysphagia Moderate malnutrition Patient required PEG tube placement on 10/16 with initiation of tube feeds. Patient with improved oral intake and PEG tube removed on 12/2. -Dietitian recommendations (12/15): Continue current diet as ordered per SLP Encourage PO intake  Changed to ordering assistance Ensure Plus High Protein po TID, each supplement provides 350 kcal and 20 grams of protein MVI with minerals daily          DVT prophylaxis: Place and maintain sequential compression device Start: 04/13/24 2120 Place TED hose Start: 04/13/24 2120 enoxaparin  (LOVENOX ) injection 40 mg Start: 04/13/24 1700 Place and maintain sequential compression device Start: 01/03/24 0611 SCDs Start: 01/03/24 0429     Code Status: Full Code Family Communication:no family at bedside   Disposition Plan: TBD Reason for continuing need for hospitalization: no bed offer   Objective: Vitals:   04/17/24 1153 04/17/24 2007 04/18/24 0456 04/18/24 1149  BP: 122/78 129/84  113/80  Pulse: 64 67  70  Resp: 16 16  18   Temp: (!) 97.4 F (36.3 C) 97.8 F (36.6 C)  97.9 F (36.6 C)  TempSrc: Oral Oral  Oral  SpO2: 96% 98%  96%  Weight:   54.5 kg   Height:       No intake or output data in the 24 hours ending 04/18/24 1418 Filed Weights   04/15/24 0500 04/17/24 0420 04/18/24 0456  Weight: 53.5 kg 53.6 kg 54.5 kg    Examination:  Physical Exam Constitutional:      Appearance: Normal appearance.  HENT:     Head: Normocephalic and atraumatic.  Eyes:     Pupils: Pupils are equal, round, and reactive to light.  Cardiovascular:     Rate and Rhythm: Normal rate.  Pulmonary:     Breath sounds: No stridor. No wheezing, rhonchi or rales.  Abdominal:     General: Abdomen is flat.     Tenderness: There is no abdominal tenderness.   Skin:    General: Skin is warm and dry.  Neurological:     General: No focal deficit present.     Mental Status: She is alert.  Psychiatric:        Mood and Affect: Mood normal.     Data Reviewed: I have personally reviewed following labs and imaging studies  CBC: No results for input(s): WBC, NEUTROABS, HGB, HCT, MCV, PLT in the last 168 hours. Basic Metabolic Panel: No results for input(s): NA, K, CL, CO2, GLUCOSE, BUN, CREATININE, CALCIUM, MG, PHOS in the last 168 hours. GFR: Estimated Creatinine Clearance: 61.2 mL/min (by C-G formula based on SCr of 0.82 mg/dL). Liver Function Tests: No results for input(s): AST, ALT, ALKPHOS, BILITOT, PROT, ALBUMIN in the last 168 hours. No results for input(s): LIPASE, AMYLASE in the last 168 hours. No results for input(s): AMMONIA in the last 168 hours. Coagulation Profile: No results for input(s): INR, PROTIME in the last 168 hours. Cardiac Enzymes: No results for input(s): CKTOTAL, CKMB, CKMBINDEX, TROPONINI in the last 168 hours. ProBNP, BNP (last 5 results) Recent Labs    05/12/23 0553 01/03/24 0145  BNP 1,407.8* 370.8*   HbA1C: No results for input(s): HGBA1C in the last 72 hours. CBG: No results for input(s): GLUCAP in the last 168 hours. Lipid Profile: No results for input(s): CHOL, HDL, LDLCALC, TRIG, CHOLHDL, LDLDIRECT in the last 72 hours. Thyroid Function Tests: No results for input(s): TSH, T4TOTAL, FREET4, T3FREE,  THYROIDAB in the last 72 hours. Anemia Panel: No results for input(s): VITAMINB12, FOLATE, FERRITIN, TIBC, IRON, RETICCTPCT in the last 72 hours. Sepsis Labs: No results for input(s): PROCALCITON, LATICACIDVEN in the last 168 hours.  No results found for this or any previous visit (from the past 240 hours).   Radiology Studies: No results found.  Scheduled Meds:  carvedilol   25 mg Oral BID WC    enoxaparin  (LOVENOX ) injection  40 mg Subcutaneous Q24H   feeding supplement  237 mL Oral TID BM   mineral oil-hydrophilic petrolatum    Topical BID   QUEtiapine   25 mg Oral QAC supper   sacubitril -valsartan   1 tablet Oral BID   senna-docusate  1 tablet Oral QHS   spironolactone   25 mg Oral Daily   Continuous Infusions:   LOS: 106 days   Time spent: 25 minutes  Lonni KANDICE Moose, MD  [facility]  04/18/2024, 2:18 PM   "

## 2024-04-19 ENCOUNTER — Other Ambulatory Visit (HOSPITAL_COMMUNITY): Payer: Self-pay

## 2024-04-19 MED ORDER — SENNOSIDES-DOCUSATE SODIUM 8.6-50 MG PO TABS: 1.0000 | ORAL_TABLET | Freq: Every day | ORAL | 0 refills | Status: AC

## 2024-04-19 MED ORDER — QUETIAPINE FUMARATE 25 MG PO TABS: 25.0000 mg | ORAL_TABLET | Freq: Every day | ORAL | 0 refills | Status: AC

## 2024-04-19 NOTE — Discharge Summary (Signed)
 " Physician Discharge Summary   Patient: Amanda [name] MRN: [id number] DOB: 12/27/1960  Admit date:     01/03/2024  Discharge date: 04/19/2024  Discharge Physician: Lonni KANDICE Moose   PCP: Patient, No Pcp Per   Recommendations at discharge:   Follow-up with PCP in 1 to 2 weeks. 2.  Follow-up with cardiology as instructed Discharge Diagnoses: Principal Problem:   ICH (intracerebral hemorrhage) (HCC) Active Problems:   Hypertensive emergency   Tobacco abuse   Cocaine abuse (HCC)   History of alcohol abuse   Chronic combined systolic and diastolic heart failure (HCC)   HTN (hypertension)   Hemorrhagic stroke (HCC)   Malnutrition of moderate degree  Resolved Problems:   * No resolved hospital problems. *   Hospital Course: [name] is a 64 y.o. female with a history of hypertension, heart failure, cocaine abuse,d diabetes mellitus type 2. Patient presented secondary to lethargy and near syncope, found to have significantly elevated blood pressure and CT evidence of acute intraparenchymal hemorrhage with intraventricular hemorrhage and hydrocephalus. Patient admitted to ICU for close blood pressure management and Neurosurgery consulted, performing a right frontal ventriculostomy. EVD eventually removed on 10/3.  PEG tube required on 10/16 and removed on 12/2.   Assessment and Plan:   Acute right thalamic intraparenchymal hemorrhage Intraventricular hemorrhage Obstructive hydrocephalus In setting of cocaine use. 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 Neurosurgery consulted no emergent placement of a ventricular drain was recommended. Patient intubated and transferred to the ICU on admission. Blood pressure management with Cleviprex . Repeat CT head significant for increase in size of medial right thalamic hematoma. Patient underwent right frontal ventriculostomy on 9/22 which was eventual clamped on 10/1 and removed on  10/3.  Repeat CT head without contrast done showed subtle sequela of prior right thalamic and left basal ganglia hemorrhage. Remote left caudate lacunar infarct. She is refusing scd's, will consider lovenox  injections now that her repeat CT head (12/28) is negative for hemorrhage    Acute metabolic encephalopathy Present on presentation and related to hydrocephalus and IVH.  She is alert and oriented to person and place.  She is impulsive at times but has mostly been calm and cooperateive    Chronic combined systolic and diastolic heart failure LVEF of 40% with associated grade I diastolic dysfunction.  - Continue Coreg , spironolactone  and Entresto  - she appears euvolemic.    Acute respiratory failure with hypoxia Patient with worsening mental status and concern for inability to protect her airway on admission. Patient was intubated on 9/22 and managed via mechanical ventilation. Patient extubated successfully on 9/23.  She is currently on RA, but has some cough.  CT chest repeated on 12/28 showed Asymmetric increased ground-glass attenuation in the posterior right upper lobe. Favor nonspecific pneumonitis. - completed 5 days of azithromycin      Tachypnea Unclear etiology. Per patient, no dyspnea. Possibly related to aspiration in setting of dysphagia. Chest x-ray unremarkable. Patient remains with SpO2 of 100% on room air. Possibly anxiety related. VBG consistent with respiratory alkalosis. D-dimer negative. Symptoms resolved.   Primary hypertension Hypertensive emergency Hypertensive emergency in setting of stroke and severely elevated blood pressure. Patient managed on Cleviprex  drip while in ICU. Cleviprex  weaned off. -Continue Coreg , spironolactone  and Entresto . -- BP parameters are well controlled.    Diabetes mellitus type 2 Well controlled based on hemoglobin A1C of 6.6%.   Cocaine abuse Patient with a history of cocaine use, most recently 3 hours prior to presentation.  Cocaine  positive on UDS.   Abnormal CT chest Imaging significant for right apical pleural scarring versus density. Recommendation for repeat CT scan in 3 months from 01/03/2024. Repeat CT chest showed  Interval resolution of the prior asymmetric density in the apical segment of the right upper lobe, compatible with an inflammatory or infectious process. Asymmetric increased ground-glass attenuation in the posterior right upper lobe. Favor nonspecific pneumonitis. 4 mm left upper lobe lung nodule is unchanged. In a patient who was at low risk, no further follow-up is indicated. If the patient is at increased risk a follow-up CT of the chest without contrast material in 12 months may be considered. - completed azithromycin  as above    Dysphagia Moderate malnutrition Patient required PEG tube placement on 10/16 with initiation of tube feeds. Patient with improved oral intake and PEG tube removed on 12/2. -Dietitian recommendations (12/15): Continue current diet as ordered per SLP Encourage PO intake  Changed to ordering assistance Ensure Plus High Protein po TID, each supplement provides 350 kcal and 20 grams of protein MVI with minerals daily          DVT prophylaxis: Place and maintain sequential compression device Start: 04/13/24 2120 Place TED hose Start: 04/13/24 2120 enoxaparin  (LOVENOX ) injection 40 mg Start: 04/13/24 1700 Place and maintain sequential compression device Start: 01/03/24 0611 SCDs Start: 01/03/24 0429     Code Status: Full Code Family Communication:no family at bedside   Disposition Plan: Home with home health. Reason for continuing need for hospitalization: no bed offer, family has agreed to take the patient home.        Consultants: Dr. Dorn Ned, neurosurgery,  intensivist, interventional radiology Procedures performed: PEG tube, right frontal ventriculostomy Disposition: Home Diet recommendation:  Discharge Diet Orders (From admission, onward)      Start     Ordered   04/19/24 0000  Diet - low sodium heart healthy        04/19/24 1148           Cardiac diet DISCHARGE MEDICATION: Allergies as of 04/19/2024   No Known Allergies      Medication List     STOP taking these medications    empagliflozin  10 MG Tabs tablet Commonly known as: JARDIANCE    isosorbide -hydrALAZINE  20-37.5 MG tablet Commonly known as: BIDIL    sacubitril -valsartan  49-51 MG Commonly known as: Entresto  Replaced by: sacubitril -valsartan  97-103 MG       TAKE these medications    acetaminophen  500 MG tablet Commonly known as: TYLENOL  Take 1 tablet (500 mg total) by mouth every 6 (six) hours as needed.   carvedilol  25 MG tablet Commonly known as: COREG  Take 1 tablet (25 mg total) by mouth 2 (two) times daily with a meal. What changed:  medication strength how much to take when to take this   feeding supplement Liqd Take 237 mLs by mouth 3 (three) times daily between meals.   free water  Soln Place 50 mLs into feeding tube daily.   furosemide  20 MG tablet Commonly known as: LASIX  Take 1 tablet (20 mg total) by mouth as needed for fluid or edema. Weight gain of 3 lb in 24 hours or 5 lb in a week.   ipratropium-albuterol  0.5-2.5 (3) MG/3ML Soln Commonly known as: DUONEB Take 3 mLs by nebulization every 4 (four) hours as needed.   Multivitamin Gummies Adult Chew Chew 2 each by mouth daily.   QUEtiapine  25 MG tablet Commonly known as: SEROQUEL  Take 1 tablet (25 mg total) by mouth at bedtime.  QUEtiapine  25 MG tablet Commonly known as: SEROQUEL  Take 1 tablet (25 mg total) by mouth daily before supper.   sacubitril -valsartan  97-103 MG Commonly known as: ENTRESTO  Take 1 tablet by mouth 2 (two) times daily. Replaces: sacubitril -valsartan  49-51 MG   senna-docusate 8.6-50 MG tablet Commonly known as: Senokot-S Take 1 tablet by mouth at bedtime.   spironolactone  25 MG tablet Commonly known as: ALDACTONE  Take 1 tablet (25 mg  total) by mouth daily.   Ventolin  HFA 108 (90 Base) MCG/ACT inhaler Generic drug: albuterol  Inhale 2 puffs into the lungs every 6 (six) hours as needed.               Durable Medical Equipment  (From admission, onward)           Start     Ordered   04/18/24 1549  For home use only DME Walker rolling  Once       Question Answer Comment  Walker: With 5 Inch Wheels   Patient needs a walker to treat with the following condition Weakness      04/18/24 1548            Follow-up Information     [facility]. Schedule an appointment as soon as possible for a visit in 4 week(s).   Specialty: Neurology Contact information: [address] [place]  [address] [phone]               Discharge Exam: Fredricka Weights   04/17/24 0420 04/18/24 0456 04/19/24 0758  Weight: 53.6 kg 54.5 kg 54.5 kg   Awake, alert, comfortable in the chair using her cell phone HEENT: PERRL, EOMI no scleral icterus, oropharynx moist and pink, neck supple no JVD Cardiovascular: Regular rate and rhythm S1-S2 no murmurs rubs gallops Lungs: Clear good air entry Abdomen: Soft nontender, nondistended bowel sounds present Extremities: No edema Neurologic: Awake alert able to ambulate on the floor.  Non Focal   Condition at discharge: stable  The results of significant diagnostics from this hospitalization (including imaging, microbiology, ancillary and laboratory) are listed below for reference.   Imaging Studies: CT CHEST W CONTRAST Result Date: 04/10/2024 EXAM: CT CHEST WITH CONTRAST 04/09/2024 12:42:18 PM TECHNIQUE: CT of the chest was performed with the administration of 75 mL of iohexol  (OMNIPAQUE ) 350 MG/ML injection. Multiplanar reformatted images are provided for review. Automated exposure control, iterative reconstruction, and/or weight based adjustment of the mA/kV was utilized to reduce the radiation dose to as low as  reasonably achievable. COMPARISON: 12/03/2023 CLINICAL HISTORY: apical scarring FINDINGS: MEDIASTINUM: Cardiac enlargement. Small pericardial effusion. Aortic atherosclerotic calcifications and coronary artery calcifications. The central airways are clear. LYMPH NODES: No mediastinal, hilar or axillary lymphadenopathy. LUNGS AND PLEURA: Emphysema. Diffuse bronchial wall thickening. Mild diffuse ground-glass attenuation is identified within both lungs with a few patchy areas of subsegmental atelectasis noted in the lower lung zones. Asymmetric increased ground-glass attenuation is noted in the posterior right upper lobe, image 52/5. The previous asymmetric density in the apical segment of the right upper lobe has resolved in the interval, compatible with an inflammatory/infectious process. A solid nodule in the left upper lobe measures 4 mm, image 48/4. No pleural effusion or pneumothorax. SOFT TISSUES/BONES: Bosniak class 2 cyst arises off the upper pole of the left kidney measuring 2.6 x 3.0 cm is noted, image 155/3. No follow-up imaging recommended. No acute abnormality of the bones or soft tissues. UPPER ABDOMEN: Limited images of the upper abdomen demonstrates no acute  abnormality. IMPRESSION: 1. Interval resolution of the prior asymmetric density in the apical segment of the right upper lobe, compatible with an inflammatory or infectious process. 2. Asymmetric increased ground-glass attenuation in the posterior right upper lobe. Favor nonspecific pneumonitis. 3. 4 mm left upper lobe lung nodule is unchanged. In a patient who was at low risk, no further follow-up is indicated. If the patient is at increased risk a follow-up CT of the chest without contrast material in 12 months may be considered 4. Emphysema 5. Aortic atherosclerosis and coronary artery calcification. 6. Cardiac enlargement and small pericardial effusion. Electronically signed by: Waddell Calk MD 04/10/2024 06:12 AM EST RP Workstation:  [id number]   CT HEAD WO CONTRAST ( ) Result Date: 04/10/2024 EXAM: CT HEAD WITHOUT CONTRAST 04/09/2024 12:42:18 PM TECHNIQUE: CT of the head was performed without the administration of intravenous contrast. Automated exposure control, iterative reconstruction, and/or weight based adjustment of the mA/kV was utilized to reduce the radiation dose to as low as reasonably achievable. COMPARISON: CT Head Jan 20, 2024 CLINICAL HISTORY: Follow up on intracerebral hemorrhage. FINDINGS: BRAIN AND VENTRICLES: No acute hemorrhage. Subtle mineralization in the right thalamus and left basal ganglia, compatible with the sequela of prior hemorrhage. No significant residual edema. No evidence of acute infarct. Remote left caudate lacunar infarct. No hydrocephalus. No extra-axial collection. No mass effect or midline shift. Cerebral atrophy. Calcific atherosclerosis. ORBITS: No acute abnormality. SINUSES: No acute abnormality. SOFT TISSUES AND SKULL: No acute soft tissue abnormality. No skull fracture. IMPRESSION: 1. No acute intracranial abnormality. 2. Subtle sequela of prior right thalamic and left basal ganglia hemorrhage. 3. Remote left caudate lacunar infarct. Electronically signed by: Gilmore Molt 04/10/2024 02:16 AM EST RP Workstation: [id number]    Microbiology: Results for orders placed or performed during the hospital encounter of 01/03/24  MRSA Next Gen by PCR, Nasal     Status: None   Collection Time: 01/03/24  4:28 AM   Specimen: Nasal Mucosa; Nasal Swab  Result Value Ref Range Status   MRSA by PCR Next Gen NOT DETECTED NOT DETECTED Final    Comment: (NOTE) The GeneXpert MRSA Assay (FDA approved for NASAL specimens only), is one component of a comprehensive MRSA colonization surveillance program. It is not intended to diagnose MRSA infection nor to guide or monitor treatment for MRSA infections. Test performance is not FDA approved in patients less than 68 years old. Performed at [facility], [address], [place], [place] [address]   Culture, blood (Routine X 2) w Reflex to ID Panel     Status: None   Collection Time: 01/11/24  8:15 PM   Specimen: BLOOD  Result Value Ref Range Status   Specimen Description BLOOD SITE NOT SPECIFIED  Final   Special Requests   Final    BOTTLES DRAWN AEROBIC ONLY Blood Culture adequate volume   Culture   Final    NO GROWTH 5 DAYS Performed at [facility], [address], [place], [place] [address]    Report Status 01/16/2024 FINAL  Final  Culture, blood (Routine X 2) w Reflex to ID Panel     Status: None   Collection Time: 01/11/24  8:22 PM   Specimen: BLOOD  Result Value Ref Range Status   Specimen Description BLOOD SITE NOT SPECIFIED  Final   Special Requests   Final    BOTTLES DRAWN AEROBIC ONLY Blood Culture adequate volume   Culture   Final    NO GROWTH 5 DAYS Performed  at [facility], [address] [place], [place] [address]    Report Status 01/16/2024 FINAL  Final    Labs: CBC: No results for input(s): WBC, NEUTROABS, HGB, HCT, MCV, PLT in the last 168 hours. Basic Metabolic Panel: No results for input(s): NA, K, CL, CO2, GLUCOSE, BUN, CREATININE, CALCIUM, MG, PHOS in the last 168 hours. Liver Function Tests: No results for input(s): AST, ALT, ALKPHOS, BILITOT, PROT, ALBUMIN in the last 168 hours. CBG: No results for input(s): GLUCAP in the last 168 hours.  Discharge time spent: greater than 30 minutes.  Signed: Lonni KANDICE Moose, MD [facility] 04/19/2024 "

## 2024-04-19 NOTE — TOC Transition Note (Signed)
 Transition of Care [facility]) - Discharge Note   Patient Details  Name: Amanda [name] MRN: [id number] Date of Birth: 1961/03/15  Transition of Care [facility]) CM/SW Contact:  Almarie CHRISTELLA Goodie, LCSW Phone Number: 04/19/2024, 12:12 PM   Clinical Narrative:   CSW confirmed with patient and family that they can provide transportation home today and will provide supervision for the patient. Patient's RW delivered to room. No further inpatient care management needs at this time.     Final next level of care: Home/Self Care Barriers to Discharge: Barriers Resolved   Patient Goals and CMS Choice Patient states their goals for this hospitalization and ongoing recovery are:: Rehab CMS Medicare.gov Compare Post Acute Care list provided to:: Patient Represenative (must comment) Choice offered to / list presented to : Adult Children [place] ownership interest in [facility].provided to:: Adult Children    Discharge Placement                Patient to be transferred to facility by: Significant other Name of family member notified: Self, [name] Patient and family notified of of transfer: 04/19/24  Discharge Plan and Services Additional resources added to the After Visit Summary for   In-house Referral: Clinical Social Work   Post Acute Care Choice: Skilled Nursing Facility                               Social Drivers of Health (SDOH) Interventions SDOH Screenings   Food Insecurity: No Food Insecurity (07/16/2023)   Received from [facility]  Housing: Low Risk (07/16/2023)   Received from [facility]  Transportation Needs: No Transportation Needs (07/16/2023)   Received from [facility]  Utilities: Not At Risk (07/16/2023)   Received from [facility]  Alcohol Screen: Low Risk (05/14/2023)  Financial Resource Strain: Low Risk (07/16/2023)   Received from [facility]  Tobacco Use: High Risk (01/26/2024)     Readmission Risk Interventions    03/26/2022   10:50  AM  Readmission Risk Prevention Plan  Transportation Screening Complete  HRI or Home Care Consult Complete  Social Work Consult for Recovery Care Planning/Counseling Complete  Palliative Care Screening Not Applicable  Medication Review Oceanographer) Referral to Pharmacy

## 2024-04-19 NOTE — Progress Notes (Signed)
 Reviewed D/C paperwork with the patient and her friend. Stated she understood the instructions and she is able to go retreive meds from pharmacy. IV removed. Ride on the way. Patient clear to roll off unit when ride arrives.

## 2024-04-19 NOTE — TOC Progression Note (Signed)
 Transition of Care [facility]) - Progression Note    Patient Details  Name: Amanda [name] MRN: [id number] Date of Birth: 03/19/1961  Transition of Care [facility]) CM/SW Contact  Almarie CHRISTELLA Goodie, [place] Phone Number: 04/19/2024, 10:28 AM  Clinical Narrative:   CSW spoke with patient's daughter, [name], and confirmed that she was able to speak with patient's friend, [name], and is comfortable with her going home in her care. Patient's significant other will also be home to assist when he is not at work. Plan to discharge home tomorrow. CSW attempted to reach [name] to confirm that transportation can be provided, left a voicemail. Patient will need RW, order given to [employer]. CSW met with patient to update, she is appreciative and excited to be going home. Patient confirmed that she will reach out to her significant other and [name] and they will be able to pick her up tomorrow. CSW updated MD of plan to discharge home tomorrow. CSW to follow.    Expected Discharge Plan: Home/Self Care Barriers to Discharge: Insurance Authorization, Continued Medical Work up, Inadequate or no insurance, Facility will not accept until restraint criteria met, SNF Pending bed offer               Expected Discharge Plan and Services In-house Referral: Clinical Social Work   Post Acute Care Choice: Skilled Nursing Facility Living arrangements for the past 2 months: Single Family Home                                       Social Drivers of Health (SDOH) Interventions SDOH Screenings   Food Insecurity: No Food Insecurity (07/16/2023)   [facility]  Housing: Low Risk (07/16/2023)   Received from [facility]  Transportation Needs: No Transportation Needs (07/16/2023)   Received from [facility]  Utilities: Not At Risk (07/16/2023)   Received from [facility]  Alcohol Screen: Low Risk (05/14/2023)  Financial Resource Strain: Low Risk (07/16/2023)   Received from [facility]  Tobacco  Use: High Risk (01/26/2024)    Readmission Risk Interventions    03/26/2022   10:50 AM  Readmission Risk Prevention Plan  Transportation Screening Complete  HRI or Home Care Consult Complete  Social Work Consult for Recovery Care Planning/Counseling Complete  Palliative Care Screening Not Applicable  Medication Review Oceanographer) Referral to Pharmacy

## 2024-04-21 ENCOUNTER — Telehealth: Payer: Self-pay | Admitting: *Deleted

## 2024-04-21 NOTE — Telephone Encounter (Signed)
 RNCM called Rx into pharmacy as ordered.

## 2024-04-21 NOTE — Progress Notes (Signed)
CKD stage 2

## 2024-04-21 NOTE — Telephone Encounter (Signed)
 Pt friend/caregivier called regarding certain medications not being sent to pharmacy.  RNCM sent secure chat to MD to send to pharmacy of choice ([facility] on [address]) for pick up.  carvedilol  (COREG ) 25 MG tablet  Sig: Take 1 tablet (25 mg total) by mouth 2 (two) times daily with a meal.    Start: 03/08/24    Quantity: -- Refills: --        feeding supplement (ENSURE PLUS HIGH PROTEIN) LIQD  Sig: Take 237 mLs by mouth 3 (three) times daily between meals.    Start: 03/08/24    Quantity: -- Refills: --        ipratropium-albuterol  (DUONEB) 0.5-2.5 (3) MG/3ML SOLN  Sig: Take 3 mLs by nebulization every 4 (four) hours as needed.    Start: 03/08/24    Quantity: -- Refills: --        QUEtiapine  (SEROQUEL ) 25 MG tablet  Sig: Take 1 tablet (25 mg total) by mouth at bedtime.    Start: 03/08/24    Quantity: -- Refills: --        sacubitril -valsartan  (ENTRESTO ) 97-103 MG  Sig: Take 1 tablet by mouth 2 (two) times daily.    Start: 03/08/24    Quantity: -- Refills: --        Water  For Irrigation, Sterile (FREE WATER ) SOLN  Sig: Place 50 mLs into feeding tube daily.    Start: 03/09/24      Lennox Leikam J. Debarah, BSN, RN, [facility]  Inpatient Care Management  Nurse Case Manager  [facility] Emergency Departments  Operative Services  [phone]
